# Patient Record
Sex: Female | Born: 1945
Health system: Southern US, Community
[De-identification: ages and names within clinical notes are randomized; demographics above are authoritative.]

## PROBLEM LIST (undated history)

## (undated) DIAGNOSIS — D72820 Lymphocytosis (symptomatic): Secondary | ICD-10-CM

## (undated) DIAGNOSIS — M1990 Unspecified osteoarthritis, unspecified site: Secondary | ICD-10-CM

## (undated) DIAGNOSIS — D869 Sarcoidosis, unspecified: Secondary | ICD-10-CM

## (undated) DIAGNOSIS — J189 Pneumonia, unspecified organism: Secondary | ICD-10-CM

## (undated) DIAGNOSIS — E079 Disorder of thyroid, unspecified: Secondary | ICD-10-CM

## (undated) DIAGNOSIS — D631 Anemia in chronic kidney disease: Secondary | ICD-10-CM

## (undated) DIAGNOSIS — N289 Disorder of kidney and ureter, unspecified: Secondary | ICD-10-CM

## (undated) DIAGNOSIS — M549 Dorsalgia, unspecified: Secondary | ICD-10-CM

## (undated) DIAGNOSIS — D649 Anemia, unspecified: Secondary | ICD-10-CM

## (undated) DIAGNOSIS — K219 Gastro-esophageal reflux disease without esophagitis: Secondary | ICD-10-CM

## (undated) DIAGNOSIS — I10 Essential (primary) hypertension: Secondary | ICD-10-CM

## (undated) DIAGNOSIS — N189 Chronic kidney disease, unspecified: Secondary | ICD-10-CM

## (undated) DIAGNOSIS — G8929 Other chronic pain: Secondary | ICD-10-CM

## (undated) DIAGNOSIS — D472 Monoclonal gammopathy: Secondary | ICD-10-CM

## (undated) HISTORY — PX: CATARACT EXTRACTION: SUR2

## (undated) HISTORY — DX: Lymphocytosis (symptomatic): D72.820

## (undated) HISTORY — DX: Anemia in chronic kidney disease: D63.1

## (undated) HISTORY — DX: Pneumonia, unspecified organism: J18.9

## (undated) HISTORY — DX: Anemia, unspecified: D64.9

## (undated) HISTORY — PX: CHOLECYSTECTOMY: SHX55

## (undated) HISTORY — DX: Essential (primary) hypertension: I10

## (undated) HISTORY — DX: Monoclonal gammopathy: D47.2

## (undated) HISTORY — DX: Gastro-esophageal reflux disease without esophagitis: K21.9

## (undated) HISTORY — PX: CARPAL TUNNEL RELEASE: SHX101

---

## 1997-09-05 ENCOUNTER — Ambulatory Visit (HOSPITAL_COMMUNITY): Admission: RE | Admit: 1997-09-05 | Discharge: 1997-09-05 | Payer: Self-pay | Admitting: Neurosurgery

## 1997-10-23 ENCOUNTER — Encounter: Admission: RE | Admit: 1997-10-23 | Discharge: 1997-10-23 | Payer: Self-pay | Admitting: Family Medicine

## 1997-11-22 ENCOUNTER — Encounter: Admission: RE | Admit: 1997-11-22 | Discharge: 1997-11-22 | Payer: Self-pay | Admitting: Family Medicine

## 1997-12-02 ENCOUNTER — Encounter: Admission: RE | Admit: 1997-12-02 | Discharge: 1997-12-02 | Payer: Self-pay | Admitting: Family Medicine

## 1998-01-17 ENCOUNTER — Encounter: Admission: RE | Admit: 1998-01-17 | Discharge: 1998-01-17 | Payer: Self-pay | Admitting: Family Medicine

## 1998-02-05 ENCOUNTER — Encounter: Admission: RE | Admit: 1998-02-05 | Discharge: 1998-02-05 | Payer: Self-pay | Admitting: Family Medicine

## 1998-03-24 ENCOUNTER — Ambulatory Visit (HOSPITAL_COMMUNITY): Admission: RE | Admit: 1998-03-24 | Discharge: 1998-03-24 | Payer: Self-pay | Admitting: *Deleted

## 1998-04-21 ENCOUNTER — Encounter: Admission: RE | Admit: 1998-04-21 | Discharge: 1998-04-21 | Payer: Self-pay | Admitting: Sports Medicine

## 1998-06-06 ENCOUNTER — Encounter: Admission: RE | Admit: 1998-06-06 | Discharge: 1998-06-06 | Payer: Self-pay | Admitting: Family Medicine

## 1998-07-18 ENCOUNTER — Encounter: Admission: RE | Admit: 1998-07-18 | Discharge: 1998-07-18 | Payer: Self-pay | Admitting: Family Medicine

## 1998-10-10 ENCOUNTER — Encounter: Admission: RE | Admit: 1998-10-10 | Discharge: 1998-10-10 | Payer: Self-pay | Admitting: Family Medicine

## 1999-02-13 ENCOUNTER — Encounter: Admission: RE | Admit: 1999-02-13 | Discharge: 1999-02-13 | Payer: Self-pay | Admitting: Family Medicine

## 1999-03-19 ENCOUNTER — Encounter: Admission: RE | Admit: 1999-03-19 | Discharge: 1999-03-19 | Payer: Self-pay | Admitting: Family Medicine

## 1999-06-05 ENCOUNTER — Encounter: Admission: RE | Admit: 1999-06-05 | Discharge: 1999-06-05 | Payer: Self-pay | Admitting: Family Medicine

## 1999-06-19 ENCOUNTER — Encounter: Admission: RE | Admit: 1999-06-19 | Discharge: 1999-06-19 | Payer: Self-pay | Admitting: Family Medicine

## 1999-07-10 ENCOUNTER — Encounter: Admission: RE | Admit: 1999-07-10 | Discharge: 1999-07-10 | Payer: Self-pay | Admitting: Family Medicine

## 1999-07-31 ENCOUNTER — Encounter: Admission: RE | Admit: 1999-07-31 | Discharge: 1999-07-31 | Payer: Self-pay | Admitting: Family Medicine

## 1999-08-06 ENCOUNTER — Ambulatory Visit (HOSPITAL_COMMUNITY): Admission: RE | Admit: 1999-08-06 | Discharge: 1999-08-06 | Payer: Self-pay | Admitting: Family Medicine

## 1999-08-07 ENCOUNTER — Encounter: Admission: RE | Admit: 1999-08-07 | Discharge: 1999-08-07 | Payer: Self-pay | Admitting: Family Medicine

## 1999-08-20 ENCOUNTER — Encounter: Payer: Self-pay | Admitting: Internal Medicine

## 1999-09-02 ENCOUNTER — Encounter: Admission: RE | Admit: 1999-09-02 | Discharge: 1999-09-02 | Payer: Self-pay | Admitting: Family Medicine

## 1999-09-03 ENCOUNTER — Encounter: Payer: Self-pay | Admitting: Sports Medicine

## 1999-09-03 ENCOUNTER — Encounter: Admission: RE | Admit: 1999-09-03 | Discharge: 1999-09-03 | Payer: Self-pay | Admitting: Sports Medicine

## 1999-09-17 ENCOUNTER — Ambulatory Visit (HOSPITAL_COMMUNITY): Admission: RE | Admit: 1999-09-17 | Discharge: 1999-09-17 | Payer: Self-pay | Admitting: Family Medicine

## 1999-09-24 ENCOUNTER — Encounter: Payer: Self-pay | Admitting: Neurosurgery

## 1999-09-28 ENCOUNTER — Ambulatory Visit (HOSPITAL_COMMUNITY): Admission: RE | Admit: 1999-09-28 | Discharge: 1999-09-28 | Payer: Self-pay | Admitting: Neurosurgery

## 1999-10-12 ENCOUNTER — Ambulatory Visit (HOSPITAL_COMMUNITY): Admission: RE | Admit: 1999-10-12 | Discharge: 1999-10-12 | Payer: Self-pay | Admitting: Family Medicine

## 1999-10-30 ENCOUNTER — Ambulatory Visit (HOSPITAL_COMMUNITY): Admission: RE | Admit: 1999-10-30 | Discharge: 1999-10-30 | Payer: Self-pay | Admitting: Neurosurgery

## 2000-02-24 ENCOUNTER — Encounter: Admission: RE | Admit: 2000-02-24 | Discharge: 2000-02-24 | Payer: Self-pay | Admitting: Family Medicine

## 2000-03-23 ENCOUNTER — Ambulatory Visit (HOSPITAL_COMMUNITY): Admission: RE | Admit: 2000-03-23 | Discharge: 2000-03-23 | Payer: Self-pay | Admitting: Family Medicine

## 2000-03-24 ENCOUNTER — Encounter: Admission: RE | Admit: 2000-03-24 | Discharge: 2000-03-24 | Payer: Self-pay | Admitting: Family Medicine

## 2000-03-24 ENCOUNTER — Encounter: Payer: Self-pay | Admitting: Family Medicine

## 2000-04-12 ENCOUNTER — Encounter: Payer: Self-pay | Admitting: Family Medicine

## 2000-04-12 ENCOUNTER — Encounter: Admission: RE | Admit: 2000-04-12 | Discharge: 2000-04-12 | Payer: Self-pay | Admitting: Family Medicine

## 2000-05-02 ENCOUNTER — Encounter: Admission: RE | Admit: 2000-05-02 | Discharge: 2000-05-02 | Payer: Self-pay | Admitting: Sports Medicine

## 2000-06-28 ENCOUNTER — Encounter: Admission: RE | Admit: 2000-06-28 | Discharge: 2000-06-28 | Payer: Self-pay | Admitting: Family Medicine

## 2000-08-09 ENCOUNTER — Encounter: Payer: Self-pay | Admitting: Sports Medicine

## 2000-08-09 ENCOUNTER — Encounter: Admission: RE | Admit: 2000-08-09 | Discharge: 2000-08-09 | Payer: Self-pay | Admitting: Sports Medicine

## 2000-08-17 ENCOUNTER — Encounter: Admission: RE | Admit: 2000-08-17 | Discharge: 2000-08-17 | Payer: Self-pay | Admitting: Family Medicine

## 2000-09-09 ENCOUNTER — Ambulatory Visit (HOSPITAL_COMMUNITY): Admission: RE | Admit: 2000-09-09 | Discharge: 2000-09-09 | Payer: Self-pay | Admitting: Family Medicine

## 2000-09-09 ENCOUNTER — Encounter: Admission: RE | Admit: 2000-09-09 | Discharge: 2000-09-09 | Payer: Self-pay | Admitting: Family Medicine

## 2000-09-15 ENCOUNTER — Encounter: Admission: RE | Admit: 2000-09-15 | Discharge: 2000-09-15 | Payer: Self-pay | Admitting: Sports Medicine

## 2000-09-30 ENCOUNTER — Encounter: Admission: RE | Admit: 2000-09-30 | Discharge: 2000-09-30 | Payer: Self-pay | Admitting: Family Medicine

## 2000-10-10 ENCOUNTER — Encounter: Payer: Self-pay | Admitting: Family Medicine

## 2000-10-10 ENCOUNTER — Encounter: Admission: RE | Admit: 2000-10-10 | Discharge: 2000-10-10 | Payer: Self-pay | Admitting: Family Medicine

## 2000-10-26 ENCOUNTER — Encounter: Admission: RE | Admit: 2000-10-26 | Discharge: 2000-10-26 | Payer: Self-pay | Admitting: Family Medicine

## 2000-11-30 ENCOUNTER — Encounter: Admission: RE | Admit: 2000-11-30 | Discharge: 2000-11-30 | Payer: Self-pay | Admitting: Family Medicine

## 2001-01-11 ENCOUNTER — Encounter: Admission: RE | Admit: 2001-01-11 | Discharge: 2001-01-11 | Payer: Self-pay | Admitting: Family Medicine

## 2001-03-03 ENCOUNTER — Encounter: Admission: RE | Admit: 2001-03-03 | Discharge: 2001-03-03 | Payer: Self-pay | Admitting: Family Medicine

## 2001-03-03 ENCOUNTER — Encounter: Payer: Self-pay | Admitting: Internal Medicine

## 2001-03-03 ENCOUNTER — Encounter: Payer: Self-pay | Admitting: Family Medicine

## 2001-03-24 ENCOUNTER — Encounter: Admission: RE | Admit: 2001-03-24 | Discharge: 2001-03-24 | Payer: Self-pay | Admitting: Family Medicine

## 2001-05-08 ENCOUNTER — Encounter: Payer: Self-pay | Admitting: Surgery

## 2001-05-11 ENCOUNTER — Encounter: Payer: Self-pay | Admitting: Surgery

## 2001-05-11 ENCOUNTER — Ambulatory Visit (HOSPITAL_COMMUNITY): Admission: RE | Admit: 2001-05-11 | Discharge: 2001-05-11 | Payer: Self-pay | Admitting: Surgery

## 2001-05-12 ENCOUNTER — Encounter (INDEPENDENT_AMBULATORY_CARE_PROVIDER_SITE_OTHER): Payer: Self-pay | Admitting: *Deleted

## 2001-05-12 ENCOUNTER — Inpatient Hospital Stay (HOSPITAL_COMMUNITY): Admission: RE | Admit: 2001-05-12 | Discharge: 2001-05-13 | Payer: Self-pay | Admitting: Surgery

## 2001-05-17 HISTORY — PX: THYROIDECTOMY: SHX17

## 2001-06-01 ENCOUNTER — Encounter: Payer: Self-pay | Admitting: Neurosurgery

## 2001-06-01 ENCOUNTER — Ambulatory Visit (HOSPITAL_COMMUNITY): Admission: RE | Admit: 2001-06-01 | Discharge: 2001-06-01 | Payer: Self-pay | Admitting: Neurosurgery

## 2001-07-07 ENCOUNTER — Encounter: Admission: RE | Admit: 2001-07-07 | Discharge: 2001-07-07 | Payer: Self-pay | Admitting: Family Medicine

## 2001-08-10 ENCOUNTER — Encounter: Payer: Self-pay | Admitting: Internal Medicine

## 2001-08-28 ENCOUNTER — Encounter: Payer: Self-pay | Admitting: Internal Medicine

## 2001-09-04 ENCOUNTER — Ambulatory Visit (HOSPITAL_COMMUNITY): Admission: RE | Admit: 2001-09-04 | Discharge: 2001-09-04 | Payer: Self-pay | Admitting: Neurosurgery

## 2001-09-04 ENCOUNTER — Encounter: Payer: Self-pay | Admitting: Neurosurgery

## 2002-01-09 ENCOUNTER — Encounter: Payer: Self-pay | Admitting: Internal Medicine

## 2002-01-23 ENCOUNTER — Encounter: Payer: Self-pay | Admitting: Internal Medicine

## 2002-03-19 ENCOUNTER — Encounter: Payer: Self-pay | Admitting: Internal Medicine

## 2002-07-23 ENCOUNTER — Encounter: Payer: Self-pay | Admitting: Internal Medicine

## 2002-08-02 ENCOUNTER — Encounter: Payer: Self-pay | Admitting: Internal Medicine

## 2003-09-12 ENCOUNTER — Encounter: Payer: Self-pay | Admitting: Internal Medicine

## 2004-06-16 ENCOUNTER — Encounter: Payer: Self-pay | Admitting: Internal Medicine

## 2004-06-29 ENCOUNTER — Encounter: Payer: Self-pay | Admitting: Internal Medicine

## 2004-10-16 ENCOUNTER — Encounter: Payer: Self-pay | Admitting: Internal Medicine

## 2005-02-17 ENCOUNTER — Encounter: Payer: Self-pay | Admitting: Internal Medicine

## 2005-03-12 ENCOUNTER — Encounter: Payer: Self-pay | Admitting: Internal Medicine

## 2005-11-04 ENCOUNTER — Encounter: Payer: Self-pay | Admitting: Internal Medicine

## 2006-01-13 ENCOUNTER — Encounter: Payer: Self-pay | Admitting: Internal Medicine

## 2006-08-16 ENCOUNTER — Encounter: Payer: Self-pay | Admitting: Internal Medicine

## 2007-01-05 ENCOUNTER — Encounter: Payer: Self-pay | Admitting: Internal Medicine

## 2007-01-17 ENCOUNTER — Encounter: Payer: Self-pay | Admitting: Internal Medicine

## 2007-10-30 ENCOUNTER — Encounter: Payer: Self-pay | Admitting: Internal Medicine

## 2008-05-17 HISTORY — PX: COLONOSCOPY: SHX174

## 2008-11-27 ENCOUNTER — Encounter: Payer: Self-pay | Admitting: Internal Medicine

## 2009-05-21 ENCOUNTER — Encounter: Payer: Self-pay | Admitting: Internal Medicine

## 2009-10-16 ENCOUNTER — Ambulatory Visit: Payer: Self-pay | Admitting: Internal Medicine

## 2009-10-16 DIAGNOSIS — G5600 Carpal tunnel syndrome, unspecified upper limb: Secondary | ICD-10-CM | POA: Insufficient documentation

## 2009-10-16 DIAGNOSIS — N182 Chronic kidney disease, stage 2 (mild): Secondary | ICD-10-CM

## 2009-10-16 DIAGNOSIS — D869 Sarcoidosis, unspecified: Secondary | ICD-10-CM | POA: Insufficient documentation

## 2009-10-16 DIAGNOSIS — IMO0002 Reserved for concepts with insufficient information to code with codable children: Secondary | ICD-10-CM | POA: Insufficient documentation

## 2009-10-16 DIAGNOSIS — M5090 Cervical disc disorder, unspecified, unspecified cervical region: Secondary | ICD-10-CM | POA: Insufficient documentation

## 2009-10-16 DIAGNOSIS — M129 Arthropathy, unspecified: Secondary | ICD-10-CM | POA: Insufficient documentation

## 2009-10-16 DIAGNOSIS — H209 Unspecified iridocyclitis: Secondary | ICD-10-CM | POA: Insufficient documentation

## 2009-10-16 DIAGNOSIS — D631 Anemia in chronic kidney disease: Secondary | ICD-10-CM | POA: Insufficient documentation

## 2009-10-16 DIAGNOSIS — I10 Essential (primary) hypertension: Secondary | ICD-10-CM | POA: Insufficient documentation

## 2009-10-16 DIAGNOSIS — M48061 Spinal stenosis, lumbar region without neurogenic claudication: Secondary | ICD-10-CM | POA: Insufficient documentation

## 2009-10-16 DIAGNOSIS — E039 Hypothyroidism, unspecified: Secondary | ICD-10-CM | POA: Insufficient documentation

## 2009-10-22 ENCOUNTER — Ambulatory Visit: Payer: Self-pay | Admitting: Internal Medicine

## 2009-10-24 ENCOUNTER — Encounter: Payer: Self-pay | Admitting: Internal Medicine

## 2009-10-30 LAB — CONVERTED CEMR LAB
ALT: 18 units/L (ref 0–35)
AST: 25 units/L (ref 0–37)
Albumin: 4.5 g/dL (ref 3.5–5.2)
Alkaline Phosphatase: 46 units/L (ref 39–117)
BUN: 28 mg/dL — ABNORMAL HIGH (ref 6–23)
Basophils Absolute: 0 10*3/uL (ref 0.0–0.1)
Basophils Relative: 0.9 % (ref 0.0–3.0)
Bilirubin, Direct: 0 mg/dL (ref 0.0–0.3)
CO2: 29 meq/L (ref 19–32)
Calcium: 9.4 mg/dL (ref 8.4–10.5)
Chloride: 107 meq/L (ref 96–112)
Cholesterol: 278 mg/dL — ABNORMAL HIGH (ref 0–200)
Creatinine, Ser: 1.6 mg/dL — ABNORMAL HIGH (ref 0.4–1.2)
Direct LDL: 148.2 mg/dL
Eosinophils Absolute: 0.1 10*3/uL (ref 0.0–0.7)
Eosinophils Relative: 3.2 % (ref 0.0–5.0)
Folate: >20 ng/mL
GFR calc non Af Amer: 41.2 mL/min (ref >60)
Glucose, Bld: 92 mg/dL (ref 70–99)
HCT: 32.6 % — ABNORMAL LOW (ref 36.0–46.0)
HDL: 100.9 mg/dL (ref >39.00)
Hemoglobin: 11.2 g/dL — ABNORMAL LOW (ref 12.0–15.0)
Iron: 91 ug/dL (ref 42–145)
Lymphocytes Relative: 54 % — ABNORMAL HIGH (ref 12.0–46.0)
Lymphs Abs: 2.3 10*3/uL (ref 0.7–4.0)
MCHC: 34.5 g/dL (ref 30.0–36.0)
MCV: 98.4 fL (ref 78.0–100.0)
Monocytes Absolute: 0.4 10*3/uL (ref 0.1–1.0)
Monocytes Relative: 9.5 % (ref 3.0–12.0)
Neutro Abs: 1.4 10*3/uL (ref 1.4–7.7)
Neutrophils Relative %: 32.4 % — ABNORMAL LOW (ref 43.0–77.0)
Platelets: 257 10*3/uL (ref 150.0–400.0)
Potassium: 3.5 meq/L (ref 3.5–5.1)
RBC: 3.31 M/uL — ABNORMAL LOW (ref 3.87–5.11)
RDW: 13.6 % (ref 11.5–14.6)
Saturation Ratios: 24.7 % (ref 20.0–50.0)
Sodium: 145 meq/L (ref 135–145)
TSH: 20.9 microintl units/mL — ABNORMAL HIGH (ref 0.35–5.50)
Total Bilirubin: 0.5 mg/dL (ref 0.3–1.2)
Total CHOL/HDL Ratio: 3
Total Protein: 7.4 g/dL (ref 6.0–8.3)
Transferrin: 262.7 mg/dL (ref 212.0–360.0)
Triglycerides: 110 mg/dL (ref 0.0–149.0)
VLDL: 22 mg/dL (ref 0.0–40.0)
Vitamin B-12: 935 pg/mL — ABNORMAL HIGH (ref 211–911)
WBC: 4.2 10*3/uL — ABNORMAL LOW (ref 4.5–10.5)

## 2009-11-18 ENCOUNTER — Telehealth (INDEPENDENT_AMBULATORY_CARE_PROVIDER_SITE_OTHER): Payer: Self-pay | Admitting: *Deleted

## 2009-12-15 ENCOUNTER — Telehealth (INDEPENDENT_AMBULATORY_CARE_PROVIDER_SITE_OTHER): Payer: Self-pay | Admitting: *Deleted

## 2009-12-17 ENCOUNTER — Ambulatory Visit: Payer: Self-pay | Admitting: Internal Medicine

## 2009-12-17 LAB — CONVERTED CEMR LAB
BUN: 22 mg/dL (ref 6–23)
Creatinine, Ser: 1.2 mg/dL (ref 0.4–1.2)
Potassium: 3.8 meq/L (ref 3.5–5.1)
TSH: 23.86 microintl units/mL — ABNORMAL HIGH (ref 0.35–5.50)

## 2009-12-19 ENCOUNTER — Ambulatory Visit: Payer: Self-pay | Admitting: Internal Medicine

## 2009-12-19 DIAGNOSIS — M5126 Other intervertebral disc displacement, lumbar region: Secondary | ICD-10-CM | POA: Insufficient documentation

## 2009-12-19 DIAGNOSIS — N183 Chronic kidney disease, stage 3 (moderate): Secondary | ICD-10-CM

## 2009-12-19 DIAGNOSIS — N184 Chronic kidney disease, stage 4 (severe): Secondary | ICD-10-CM | POA: Insufficient documentation

## 2009-12-22 ENCOUNTER — Telehealth (INDEPENDENT_AMBULATORY_CARE_PROVIDER_SITE_OTHER): Payer: Self-pay | Admitting: *Deleted

## 2010-01-14 ENCOUNTER — Telehealth (INDEPENDENT_AMBULATORY_CARE_PROVIDER_SITE_OTHER): Payer: Self-pay | Admitting: *Deleted

## 2010-01-20 ENCOUNTER — Ambulatory Visit: Payer: Self-pay | Admitting: Internal Medicine

## 2010-01-20 DIAGNOSIS — R635 Abnormal weight gain: Secondary | ICD-10-CM | POA: Insufficient documentation

## 2010-01-20 DIAGNOSIS — M25569 Pain in unspecified knee: Secondary | ICD-10-CM | POA: Insufficient documentation

## 2010-01-22 ENCOUNTER — Telehealth (INDEPENDENT_AMBULATORY_CARE_PROVIDER_SITE_OTHER): Payer: Self-pay | Admitting: *Deleted

## 2010-02-04 ENCOUNTER — Encounter: Payer: Self-pay | Admitting: Internal Medicine

## 2010-02-18 ENCOUNTER — Ambulatory Visit: Payer: Self-pay | Admitting: Internal Medicine

## 2010-02-19 LAB — CONVERTED CEMR LAB: TSH: 0.19 microintl units/mL — ABNORMAL LOW (ref 0.35–5.50)

## 2010-02-24 ENCOUNTER — Telehealth (INDEPENDENT_AMBULATORY_CARE_PROVIDER_SITE_OTHER): Payer: Self-pay | Admitting: *Deleted

## 2010-02-26 ENCOUNTER — Ambulatory Visit: Payer: Self-pay | Admitting: Internal Medicine

## 2010-03-02 ENCOUNTER — Telehealth: Payer: Self-pay | Admitting: Internal Medicine

## 2010-03-11 ENCOUNTER — Telehealth (INDEPENDENT_AMBULATORY_CARE_PROVIDER_SITE_OTHER): Payer: Self-pay | Admitting: *Deleted

## 2010-03-11 ENCOUNTER — Encounter: Payer: Self-pay | Admitting: Internal Medicine

## 2010-03-30 ENCOUNTER — Encounter: Payer: Self-pay | Admitting: Internal Medicine

## 2010-04-03 ENCOUNTER — Telehealth (INDEPENDENT_AMBULATORY_CARE_PROVIDER_SITE_OTHER): Payer: Self-pay | Admitting: *Deleted

## 2010-04-16 DIAGNOSIS — J189 Pneumonia, unspecified organism: Secondary | ICD-10-CM

## 2010-04-16 HISTORY — DX: Pneumonia, unspecified organism: J18.9

## 2010-04-17 ENCOUNTER — Telehealth: Payer: Self-pay | Admitting: Internal Medicine

## 2010-04-29 ENCOUNTER — Ambulatory Visit: Payer: Self-pay | Admitting: Internal Medicine

## 2010-05-03 ENCOUNTER — Emergency Department (HOSPITAL_COMMUNITY)
Admission: EM | Admit: 2010-05-03 | Discharge: 2010-05-03 | Payer: Self-pay | Source: Home / Self Care | Admitting: Emergency Medicine

## 2010-05-04 ENCOUNTER — Telehealth: Payer: Self-pay | Admitting: Internal Medicine

## 2010-05-04 ENCOUNTER — Ambulatory Visit: Payer: Self-pay | Admitting: Internal Medicine

## 2010-05-04 DIAGNOSIS — J189 Pneumonia, unspecified organism: Secondary | ICD-10-CM | POA: Insufficient documentation

## 2010-05-05 LAB — CONVERTED CEMR LAB
Basophils Absolute: 0 10*3/uL (ref 0.0–0.1)
Basophils Relative: 0.2 % (ref 0.0–3.0)
Eosinophils Absolute: 0.2 10*3/uL (ref 0.0–0.7)
Eosinophils Relative: 2.7 % (ref 0.0–5.0)
HCT: 30.7 % — ABNORMAL LOW (ref 36.0–46.0)
Hemoglobin: 10.4 g/dL — ABNORMAL LOW (ref 12.0–15.0)
Lymphocytes Relative: 27.9 % (ref 12.0–46.0)
Lymphs Abs: 1.6 10*3/uL (ref 0.7–4.0)
MCHC: 33.8 g/dL (ref 30.0–36.0)
MCV: 97.2 fL (ref 78.0–100.0)
Monocytes Absolute: 1 10*3/uL (ref 0.1–1.0)
Monocytes Relative: 17.2 % — ABNORMAL HIGH (ref 3.0–12.0)
Neutro Abs: 2.9 10*3/uL (ref 1.4–7.7)
Neutrophils Relative %: 52 % (ref 43.0–77.0)
Platelets: 315 10*3/uL (ref 150.0–400.0)
RBC: 3.16 M/uL — ABNORMAL LOW (ref 3.87–5.11)
RDW: 14.5 % (ref 11.5–14.6)
WBC: 5.7 10*3/uL (ref 4.5–10.5)

## 2010-05-26 ENCOUNTER — Ambulatory Visit
Admission: RE | Admit: 2010-05-26 | Discharge: 2010-05-26 | Payer: Self-pay | Source: Home / Self Care | Attending: Internal Medicine | Admitting: Internal Medicine

## 2010-05-26 DIAGNOSIS — R0609 Other forms of dyspnea: Secondary | ICD-10-CM | POA: Insufficient documentation

## 2010-05-26 DIAGNOSIS — R0989 Other specified symptoms and signs involving the circulatory and respiratory systems: Secondary | ICD-10-CM | POA: Insufficient documentation

## 2010-06-01 ENCOUNTER — Ambulatory Visit
Admission: RE | Admit: 2010-06-01 | Discharge: 2010-06-01 | Payer: Self-pay | Source: Home / Self Care | Attending: Internal Medicine | Admitting: Internal Medicine

## 2010-06-03 ENCOUNTER — Encounter: Payer: Self-pay | Admitting: Internal Medicine

## 2010-06-08 ENCOUNTER — Encounter
Admission: RE | Admit: 2010-06-08 | Discharge: 2010-06-08 | Payer: Self-pay | Source: Home / Self Care | Attending: Neurosurgery | Admitting: Neurosurgery

## 2010-06-10 ENCOUNTER — Telehealth (INDEPENDENT_AMBULATORY_CARE_PROVIDER_SITE_OTHER): Payer: Self-pay | Admitting: *Deleted

## 2010-06-16 NOTE — Assessment & Plan Note (Signed)
Summary: new to est//lch   Vital Signs:  Patient profile:   65 year old female Height:      64 inches Weight:      191.2 pounds BMI:     32.94 Temp:     98.4 degrees F oral Pulse rate:   64 / minute Resp:     14 per minute BP sitting:   146 / 80  (left arm) Cuff size:   large  Vitals Entered By: Georgette Dover (October 16, 2009 3:06 PM) CC: New Patient Establish: CPX  Comments REVIEWED MED LIST, PATIENT AGREED DOSE AND INSTRUCTION CORRECT    CC:  New Patient Establish: CPX .  History of Present Illness: Tina Molina is here to establish as a primary care patient ; she has relocated from [place]. She has active symptoms related to LS Spinal Stenosis for which she sees Dr Annette Stable, [place].  Preventive Screening-Counseling & Management  Alcohol-Tobacco     Smoking Status: quit  Caffeine-Diet-Exercise     Does Patient Exercise: no  Allergies (verified): 1)  ! Sulfa 2)  ! Penicillin 3)  ! Codeine  Past History:  Past Medical History: Hypertension Arthritis, DJD; Spinal Stenosis LS, S/P ESI ? X 7 , Dr Darryl Lent ,Orthopedist, [place]; Uveitis Sarcoidosis, PMH of  in 1990s Hypothyroidism Anemia, Dr Juleen China , [facility] , [place] , [place], [facility] therapy; Alopoecia from Folliculitis, Dr Dwaine Deter, [place]  Past Surgical History: Cholecystectomy-2006 CARPAL TUNNEL SYNDROME, BILATERAL (ICD-354.0), S/P surgery CERVICAL DISC DISORDER (ICD-722.91), S/P Discectomy X3 Thyroidectomy-2003 for goiter G 0 P 0; Colonoscopy negative 2010  Family History: Father: anemia,arthritis, CHF Mother: DM,arthritis, blind from Glaucoma Siblings: bro:HTN, anemia, renal disease; sister :HTN, renal disease , anemia ; M aunts : DM; PGM : arthritis  Social History: Separated Retired Former Smoker:quit 1999 Alcohol use-yes:occasionally Regular exercise-no Smoking Status:  quit Does Patient Exercise:  no  Review of Systems General:  Complains of fatigue; denies chills, fever, and sweats; Weight loss of 8# on  purpose. Eyes:  Denies blurring, double vision, and vision loss-both eyes. ENT:  Denies difficulty swallowing and hoarseness. CV:  Complains of palpitations, shortness of breath with exertion, swelling of feet, and swelling of hands; denies chest pain or discomfort, difficulty breathing at night, difficulty breathing while lying down, and leg cramps with exertion; Occasional fluttering. Resp:  Denies cough, excessive snoring, hypersomnolence, morning headaches, and sputum productive. GI:  Denies abdominal pain, bloody stools, dark tarry stools, and indigestion. GU:  Denies discharge, dysuria, and hematuria. MS:  Complains of joint pain, low back pain, and mid back pain; denies joint redness and joint swelling. Derm:  Denies changes in nail beds, dryness, hair loss, and lesion(s). Neuro:  Complains of numbness, tingling, and weakness; denies brief paralysis; N&T LLE > RLE; weakness intermittently after standing. Intermittent pain R buttock & R inguinal crease. Psych:  Complains of anxiety; denies depression, easily angered, easily tearful, and irritability. Endo:  Denies cold intolerance, excessive hunger, excessive thirst, excessive urination, and heat intolerance. Heme:  Complains of abnormal bruising; denies bleeding. Allergy:  Denies itching eyes and sneezing.  Physical Exam  General:  well-nourished,in no acute distress; alert,appropriate and cooperative throughout examination Head:  Normocephalic and atraumatic without obvious abnormalities. Patchy  alopecia  Eyes:  No corneal or conjunctival inflammation noted. Marland Kitchen Perrla. Funduscopic exam benign, without hemorrhages, exudates or papilledema.  Ears:  External ear exam shows no significant lesions or deformities.  Otoscopic examination reveals clear canals, tympanic membranes are intact bilaterally without bulging, retraction,  inflammation or discharge. Hearing is grossly normal bilaterally. Nose:  External nasal examination shows no  deformity or inflammation. Nasal mucosa are pink and moist without lesions or exudates. Mouth:  Oral mucosa and oropharynx without lesions or exudates.  Teeth in good repair. Neck:  No deformities, masses, or tenderness noted. Scar tissue subcutaneously anterior neck Lungs:  Normal respiratory effort, chest expands symmetrically. Lungs are clear to auscultation, no crackles or wheezes. Heart:  Normal rate and regular rhythm. S1 and S2 normal without gallop, murmur, click, rub. S4 Abdomen:  Bowel sounds positive,abdomen soft and non-tender without masses, organomegaly or hernias noted. Genitalia:  as per Gyn Msk:  No deformity or scoliosis noted of thoracic or lumbar spine.   Pulses:  R and L carotid,radial,dorsalis pedis and posterior tibial pulses are full and equal bilaterally Extremities:  No clubbing, cyanosis, edema, or deformity noted with normal full range of motion of all joints.   Fusiform knees; pain LS area with SLR & hip ROM Neurologic:  alert & oriented X3, strength normal in all extremities, and DTRs symmetrical and normal except decreased L knee DTR.   Skin:  Intact without suspicious lesions or rashes Cervical Nodes:  No lymphadenopathy noted Axillary Nodes:  No palpable lymphadenopathy Psych:  memory intact for recent and remote, normally interactive, and good eye contact.     Impression & Recommendations:  Problem # 1:  ROUTINE GENERAL MEDICAL EXAM@HEALTH  CARE FACL (ICD-V70.0)  Orders: EKG w/ Interpretation (93000)  Problem # 2:  ANEMIA-NOS (ICD-285.9) PMH of  Problem # 3:  HYPOTHYROIDISM (ICD-244.9)  Her updated medication list for this problem includes:    Levothyroxine Sodium 112 Mcg Tabs (Levothyroxine sodium) .Marland Kitchen... 1 by mouth once daily  Problem # 4:  SPINAL STENOSIS, LUMBAR (ICD-724.02) as per Dr Annette Stable  Problem # 5:  HYPERTENSION (ICD-401.9)  Her updated medication list for this problem includes:    Chlorthalidone 25 Mg Tabs (Chlorthalidone) .Marland Kitchen... 1 by  mouth once daily    Toprol Xl 100 Mg Xr24h-tab (Metoprolol succinate) .Marland Kitchen... 1 by mouth two times a day    Diovan 320 Mg Tabs (Valsartan) .Marland Kitchen... 1 by mouth once daily  Orders: EKG w/ Interpretation (93000)  Problem # 6:  ARTHRITIS (ICD-716.90) DJD, DDD  Complete Medication List: 1)  Chlorthalidone 25 Mg Tabs (Chlorthalidone) .Marland Kitchen.. 1 by mouth once daily 2)  Toprol Xl 100 Mg Xr24h-tab (Metoprolol succinate) .Marland Kitchen.. 1 by mouth two times a day 3)  Diovan 320 Mg Tabs (Valsartan) .Marland Kitchen.. 1 by mouth once daily 4)  Levothyroxine Sodium 112 Mcg Tabs (Levothyroxine sodium) .Marland Kitchen.. 1 by mouth once daily 5)  Bayer Low Strength 81 Mg Tbec (Aspirin) .Marland Kitchen.. 1 by mouth once daily 6)  Vicodin 5-500 Mg Tabs (Hydrocodone-acetaminophen) .... As needed for pain 7)  Ultram 50 Mg Tabs (Tramadol hcl) .Marland Kitchen.. 1 by mouth three times a day 8)  Diazepam 5 Mg Tabs (Diazepam) .Marland Kitchen.. 1 two times a day as needed  Patient Instructions: 1)  Please schedule fasting labs; see Diagnoses for Codes: 2)  BMP ; 3)  Hepatic Panel ; 4)  Lipid Panel ; 5)  TSH; 6)  CBC w/ Diff ; B12/folate; iron panel 7)  Check your Blood Pressure regularly. If it is above: 135/85 on average  you should make an appointment. Prescriptions: DIAZEPAM 5 MG TABS (DIAZEPAM) 1 two times a day as needed  #60 x 2   Entered and Authorized by:   Unice Cobble MD   Signed by:   Unice Cobble MD  on 10/16/2009   Method used:   Print then Give to Patient   RxID:   [id number] DIOVAN 320 MG TABS (VALSARTAN) 1 by mouth once daily  #90 x 1   Entered and Authorized by:   Unice Cobble MD   Signed by:   Unice Cobble MD on 10/16/2009   Method used:   Print then Give to Patient   RxID:   [id number]    Immunization History:  Tetanus/Td Immunization History:    Tetanus/Td:  tdap (06/01/2009)

## 2010-06-16 NOTE — Assessment & Plan Note (Signed)
Summary: rto 2 month/cbs   Vital Signs:  Patient profile:   65 year old female Weight:      198 pounds BMI:     34.11 Temp:     98.4 degrees F oral Pulse rate:   80 / minute Resp:     17 per minute BP sitting:   130 / 78  (left arm) Cuff size:   large  Vitals Entered By: Georgette Dover CMA (February 26, 2010 3:05 PM) CC: 2 Month follow-up, ongoing pain   CC:  2 Month follow-up and ongoing pain.  History of Present Illness: R knee pain unchanged despite injection 02/04/2010 by Dr Onnie Graham ; that evaluation was reviewed: "strain with arthrosis exacerbation". Her LS area pain has become throbbing in past 2 weeks; the pain radiates into R inguinal area. She last saw Dr Hillery Jacks in 12/2009. He recommnded disc surgery . She has Spinal Stenosis ; S/P ESI ?6-8X over  past 7 years, all in [place] by Dr Werner Lean.TSH was 0.19; goal discussed( 1-3)  Current Medications (verified): 1)  Chlorthalidone 25 Mg Tabs (Chlorthalidone) .Marland Kitchen.. 1 By Mouth Once Daily 2)  Toprol Xl 100 Mg Xr24h-Tab (Metoprolol Succinate) .Marland Kitchen.. 1 By Mouth Two Times A Day 3)  Diovan 320 Mg Tabs (Valsartan) .Marland Kitchen.. 1 By Mouth Once Daily 4)  Levothyroxine Sodium 150  Mcg Tabs (Levothyroxine Sodium) .Marland Kitchen.. 1 Once Daily 5)  Bayer Low Strength 81 Mg Tbec (Aspirin) .Marland Kitchen.. 1 By Mouth Once Daily 6)  Vicodin 5-500 Mg Tabs (Hydrocodone-Acetaminophen) .Marland Kitchen.. 1 Every 6 Hrs As Needed For Back Pain 7)  Diazepam 5 Mg Tabs (Diazepam) .Marland Kitchen.. 1 Two Times A Day As Needed 8)  Zolpidem Tartrate 10 Mg Tabs (Zolpidem Tartrate) .Marland Kitchen.. 1 By Mouth Q Third Night As Needed Only 9)  Timolol Maleate 0.5 % Soln (Timolol Maleate) .Marland Kitchen.. 1 Drop in Both Eyes Every Morning 10)  Voltaren 1 % Gel (Diclofenac Sodium) .... Apply 4 Grams To Knees Qid 11)  Potassium Chloride Cr 10 Meq Cr-Tabs (Potassium Chloride) .Marland Kitchen.. 1 By Mouth Once Daily 12)  Fluocinonide 0.05 % Oint (Fluocinonide) .... Apply To Scalp 13)  Rogaine 2 % Soln (Minoxidil) .... Use On Scalp Every Other Day 14)  Hydrocortisone  2.5 % Crea (Hydrocortisone) .... Apply To Mouth 2-3 X Weekly 15)  Gabapentin 100 Mg Caps (Gabapentin) .Marland Kitchen.. 1 Every 8 Hrs As Needed For Nerve Pain 16)  Vitamin B-12 500 Mcg Subl (Cyanocobalamin) .Marland Kitchen.. 1 By Mouth Once Daily 17)  Nopalea 3oz .... Two Times A Day 18)  Cymbalta 60 Mg Cpep (Duloxetine Hcl) .Marland Kitchen.. 1 Once Daily  Allergies: 1)  ! Sulfa 2)  ! Penicillin 3)  ! Codeine  Review of Systems GU:  Denies incontinence. Neuro:  Denies brief paralysis and weakness.  Physical Exam  General:  in no acute distress; alert,appropriate and cooperative throughout examination Msk:  Lay down & sat up w/o help Extremities:  No clubbing, cyanosis, edema. Crepitus L knee > R. ? minimal effusion L knee. Neurologic:  alert & oriented X3, strength normal in all extremities, gait abnormal  with limping on R knee, and DTRs asymmetrical  0+ @ L knee   Impression & Recommendations:  Problem # 1:  KNEE PAIN, RIGHT (ICD-719.46)  Her updated medication list for this problem includes:    Bayer Low Strength 81 Mg Tbec (Aspirin) .Marland Kitchen... 1 by mouth once daily    Vicodin 5-500 Mg Tabs (Hydrocodone-acetaminophen) .Marland Kitchen... 1 every 6 hrs as needed for back pain  Orders: Orthopedic Referral (  Ortho)  Problem # 2:  HERNIATED LUMBAR DISC (ICD-722.10) PMH of  Problem # 3:  SPINAL STENOSIS, LUMBAR (ICD-724.02) L 1or L 2 distribution of pain  Problem # 4:  HYPOTHYROIDISM (ICD-244.9)  Complete Medication List: 1)  Chlorthalidone 25 Mg Tabs (Chlorthalidone) .Marland Kitchen.. 1 by mouth once daily 2)  Toprol Xl 100 Mg Xr24h-tab (Metoprolol succinate) .Marland Kitchen.. 1 by mouth two times a day 3)  Diovan 320 Mg Tabs (Valsartan) .Marland Kitchen.. 1 by mouth once daily 4)  Levothyroxine Sodium 150 Mcg Tabs (levothyroxine Sodium)  .Marland Kitchen.. 1 once daily except 1/2 every weds 5)  Bayer Low Strength 81 Mg Tbec (Aspirin) .Marland Kitchen.. 1 by mouth once daily 6)  Vicodin 5-500 Mg Tabs (Hydrocodone-acetaminophen) .Marland Kitchen.. 1 every 6 hrs as needed for back pain 7)  Diazepam 5 Mg Tabs  (Diazepam) .Marland Kitchen.. 1 two times a day as needed 8)  Zolpidem Tartrate 10 Mg Tabs (Zolpidem tartrate) .Marland Kitchen.. 1 by mouth q third night as needed only 9)  Timolol Maleate 0.5 % Soln (Timolol maleate) .Marland Kitchen.. 1 drop in both eyes every morning 10)  Voltaren 1 % Gel (Diclofenac sodium) .... Apply 4 grams to knees qid 11)  Potassium Chloride Cr 10 Meq Cr-tabs (Potassium chloride) .Marland Kitchen.. 1 by mouth once daily 12)  Fluocinonide 0.05 % Oint (Fluocinonide) .... Apply to scalp 13)  Rogaine 2 % Soln (Minoxidil) .... Use on scalp every other day 14)  Hydrocortisone 2.5 % Crea (Hydrocortisone) .... Apply to mouth 2-3 x weekly 15)  Gabapentin 100 Mg Caps (Gabapentin) .Marland Kitchen.. 1 every 8 hrs as needed for nerve pain 16)  Vitamin B-12 500 Mcg Subl (Cyanocobalamin) .Marland Kitchen.. 1 by mouth once daily 17)  Nopalea 3oz  .... Two times a day 18)  Cymbalta 60 Mg Cpep (Duloxetine hcl) .Marland Kitchen.. 1 once daily  Patient Instructions: 1)  Every 48 hrs increase the Gabapentin by 1 pill every 8 hrs , upto a max of 300 mg(3 pills) every 8 hrs as needed . Either see Dr Annette Stable or Dr Nelva Bush to address the chronic back issues. 2)  Please schedule a follow-up appointment in 3 months. 3)  TSH prior to visit, ICD-9:244.9.

## 2010-06-16 NOTE — Progress Notes (Signed)
Summary: Refill Request  Phone Note Refill Request Call back at [phone] Message from:  Pharmacy on April 03, 2010 8:14 AM  Refills Requested: Medication #1:  DIAZEPAM 5 MG TABS 1 two times a day as needed   Dosage confirmed as above?Dosage Confirmed   Supply Requested: 1 month   Last Refilled: [facility]/24/2010 [facility] [address]   Next Appointment Scheduled: none Initial call taken by: Elna Breslow,  April 03, 2010 8:15 AM    Prescriptions: DIAZEPAM 5 MG TABS (DIAZEPAM) 1 two times a day as needed  #60 x 1   Entered by:   Georgette Dover CMA   Authorized by:   Unice Cobble MD   Signed by:   Georgette Dover CMA on 04/03/2010   Method used:   Printed then faxed to ...       [facility] [phone]* (retail)       [place], [place]  [id number]       Ph: [phone] or [phone]       Fax: [phone]   RxID:   [id number]

## 2010-06-16 NOTE — Consult Note (Signed)
Summary: [facility]   Imported By: Edmonia James 02/16/2010 09:12:49  _____________________________________________________________________  External Attachment:    Type:   Image     Comment:   External Document

## 2010-06-16 NOTE — Letter (Signed)
Summary: [facility]   Imported By: Edmonia James 04/13/2010 14:20:14  _____________________________________________________________________  External Attachment:    Type:   Image     Comment:   External Document

## 2010-06-16 NOTE — Progress Notes (Signed)
Summary: refill  Phone Note Refill Request Message from:  Fax from Pharmacy on December 22, 2009 2:39 PM  Refills Requested: Medication #1:  ZOLPIDEM TARTRATE 10 MG TABS 1 by mouth q third night as needed only [facility] [address] - fax [phone]  Initial call taken by: Arbie [place],  December 22, 2009 2:40 PM    Prescriptions: ZOLPIDEM TARTRATE 10 MG TABS (ZOLPIDEM TARTRATE) 1 by mouth q third night as needed only  #10 x 0   Entered by:   Georgette Dover CMA   Authorized by:   Unice Cobble MD   Signed by:   Georgette Dover CMA on 12/22/2009   Method used:   Printed then faxed to ...       [facility] [phone]* (retail)       [place], [place]  [id number]       Ph: [phone] or [phone]       Fax: [phone]   RxID:   [id number]

## 2010-06-16 NOTE — Letter (Signed)
Summary: [facility]  [facility]   Imported By: Phillis Knack 01/23/2010 08:32:59  _____________________________________________________________________  External Attachment:    Type:   Image     Comment:   External Document

## 2010-06-16 NOTE — Letter (Signed)
Summary: [facility]   Imported By: Edmonia James 11/06/2009 10:06:02  _____________________________________________________________________  External Attachment:    Type:   Image     Comment:   External Document

## 2010-06-16 NOTE — Assessment & Plan Note (Signed)
Summary: Ongoing back pain/scm   Vital Signs:  Patient profile:   65 year old female Weight:      199.8 pounds Temp:     98.3 degrees F oral Pulse rate:   84 / minute Resp:     16 per minute BP sitting:   146 / 80  (left arm) Cuff size:   large  Vitals Entered By: Georgette Dover CMA (January 20, 2010 3:32 PM) CC: Ongoing back pain, Lower Extremity Joint pain, Back pain   CC:  Ongoing back pain, Lower Extremity Joint pain, and Back pain.  History of Present Illness: Lower Extremity Joint Pain      This is a 65 year old woman who presents with Lower Extremity Joint pain.  The patient reports swelling &  redness which have improved with Naproxen from UC.They provided Patient Information on Meniscal Tear as per Xrays.She has still has  decreased ROM, and weakness, but denies giving away, locking, and popping.  The pain is located in the right knee.  The pain began with twisting her leg  on the tile.  The pain is described as sharp, intermittent, and activity related.  The patient denies the following symptoms: fever, rash, photosensitivity, eye symptoms, diarrhea, and dysuria.   Back Pain      The patient also presents with Back pain.  The patient denies chills, fecal incontinence, urinary incontinence, urinary retention, and inability to care for self.  The pain is located in the right low back and left low back.  The pain began gradually and after straining R knee  .  The pain radiates to the right leg above  the knee.  The pain is made worse by standing or walking and extension.  The pain is made better by sitting or bending forward.  PMH of Spinal Stenosis.   NSAIDS  contraindicated as per her Orthopedist in [place] due to either renal or liver issues.                                                                                         Concerns re: weight gain: 7 -8 # weight gain. Previously Phentermine .Physiology of High Fructose Corn Syrup induced obesity discussed. Risks of Diet Pills  discussed .  Allergies: 1)  ! Sulfa 2)  ! Penicillin 3)  ! Codeine  Physical Exam  General:  well-nourished,in no acute distress; alert,appropriate and cooperative throughout examination Abdomen:  Bowel sounds positive,abdomen soft and non-tender without masses, organomegaly or hernias noted. No AAA  Msk:  Classic low back crawl up & down exam table Psych:  memory intact for recent and remote, normally interactive, good eye contact, and not anxious appearing.     Detailed Back/Spine Exam  Skin:    Intact with no erythema; no scarring.    Palpation:    Tender to percussion LS spine  Vascular:    dorsalis pedis and posterior tibial pulses 2+ and symmetric; no evidence of ischemia.   Lumbosacral Exam:  Lying Straight Leg Raise:    Right:  negative    Left:  negative Toe Walking:    Right:  normal  Left:  normal Heel Walking:    Right:  normal    Left:  normal   Knee Exam  Skin:    Intact, no scars, lesions, rashes, cafe au lait spots, or bruising.    Inspection:     No deformity, ecchymosis or swelling.   Palpation:    Non-tender to palpation over medial joint line, lateral joint line, parapatellar, condylar, patellar tendon, or Pes bursa.   Motor:    Motor strength  decreased R  quadriceps  Reflexes:    decreased L-patellar; normal R.    Knee Exam:    Crepitus of knees   Problems:  Medical Problems Added: 1)  Dx of Weight Gain  (ICD-783.1) 2)  Dx of Knee Pain, Right  [id number])  Impression & Recommendations:  Problem # 1:  KNEE PAIN, RIGHT (ICD-719.46)  Her updated medication list for this problem includes:    Bayer Low Strength 81 Mg Tbec (Aspirin) .Marland Kitchen... 1 by mouth once daily    Vicodin 5-500 Mg Tabs (Hydrocodone-acetaminophen) .Marland Kitchen... 1 every 6 hrs as needed for back pain    Ultram 50 Mg Tabs (Tramadol hcl) .Marland Kitchen... 1 by mouth three times a day  Orders: Orthopedic Referral (Ortho)  Problem # 2:  SPINAL STENOSIS, LUMBAR (ICD-724.02) acute  exacerabtion  Problem # 3:  WEIGHT GAIN (ICD-783.1)  Complete Medication List: 1)  Chlorthalidone 25 Mg Tabs (Chlorthalidone) .Marland Kitchen.. 1 by mouth once daily 2)  Toprol Xl 100 Mg Xr24h-tab (Metoprolol succinate) .Marland Kitchen.. 1 by mouth two times a day 3)  Diovan 320 Mg Tabs (Valsartan) .Marland Kitchen.. 1 by mouth once daily 4)  Levothyroxine Sodium 150 Mcg Tabs (levothyroxine Sodium)  .Marland Kitchen.. 1 once daily 5)  Bayer Low Strength 81 Mg Tbec (Aspirin) .Marland Kitchen.. 1 by mouth once daily 6)  Vicodin 5-500 Mg Tabs (Hydrocodone-acetaminophen) .Marland Kitchen.. 1 every 6 hrs as needed for back pain 7)  Ultram 50 Mg Tabs (Tramadol hcl) .Marland Kitchen.. 1 by mouth three times a day 8)  Diazepam 5 Mg Tabs (Diazepam) .Marland Kitchen.. 1 two times a day as needed 9)  Zolpidem Tartrate 10 Mg Tabs (Zolpidem tartrate) .Marland Kitchen.. 1 by mouth q third night as needed only 10)  Timolol Maleate 0.5 % Soln (Timolol maleate) .Marland Kitchen.. 1 drop in both eyes every morning 11)  Voltaren 1 % Gel (Diclofenac sodium) .... Apply 4 grams to knees qid 12)  Potassium Chloride Cr 10 Meq Cr-tabs (Potassium chloride) .Marland Kitchen.. 1 by mouth once daily 13)  Fluocinonide 0.05 % Oint (Fluocinonide) .... Apply to scalp 14)  Rogaine 2 % Soln (Minoxidil) .... Use on scalp every other day 15)  Hydrocortisone 2.5 % Crea (Hydrocortisone) .... Apply to mouth 2-3 x weekly 16)  Gabapentin 100 Mg Caps (Gabapentin) .Marland Kitchen.. 1 every 8 hrs as needed for nerve pain 17)  Vitamin B-12 500 Mcg Subl (Cyanocobalamin) .Marland Kitchen.. 1 by mouth once daily 18)  Nopalea 3oz  .... Two times a day 19)  Cymbalta 60 Mg Cpep (Duloxetine hcl) .Marland Kitchen.. 1 once daily  Patient Instructions: 1)  Consume LESS THAN  25 grams of High Fructose Corn Syrup sugar/day. Cymbalta 30 mg once daily X 7 days then start 60 mg daily. 2)  Check your Blood Pressure regularly. If it is above: 135/85 ON AVERAGE you should make an appointment. Prescriptions: CYMBALTA 60 MG CPEP (DULOXETINE HCL) 1 once daily  #30 x 5   Entered and Authorized by:   Unice Cobble MD   Signed by:   Unice Cobble MD on 01/20/2010   Method  used:   Print then Give to Patient   RxID:   [id number] VICODIN 5-500 MG TABS (HYDROCODONE-ACETAMINOPHEN) 1 every 6 hrs as needed for back pain  #30 x 0   Entered and Authorized by:   Unice Cobble MD   Signed by:   Unice Cobble MD on 01/20/2010   Method used:   Print then Give to Patient   RxID:   [id number]   Appended Document: Ongoing back pain/scm Prescriptions: GABAPENTIN 100 MG CAPS (GABAPENTIN) 1 every 8 hrs as needed for nerve pain  #30 x 1   Entered by:   Georgette Dover CMA   Authorized by:   Unice Cobble MD   Signed by:   Georgette Dover CMA on 01/20/2010   Method used:   Print then Give to Patient   RxID:   [id number] DIAZEPAM 5 MG TABS (DIAZEPAM) 1 two times a day as needed  #60 x 1   Entered by:   Georgette Dover CMA   Authorized by:   Unice Cobble MD   Signed by:   Georgette Dover CMA on 01/20/2010   Method used:   Print then Give to Patient   RxID:   [id number]  Flu Vaccine Consent Questions     Do you have a history of severe allergic reactions to this vaccine? no    Any prior history of allergic reactions to egg and/or gelatin? no    Do you have a sensitivity to the preservative Thimersol? no    Do you have a past history of Guillan-Barre Syndrome? no    Do you currently have an acute febrile illness? no    Have you ever had a severe reaction to latex? no    Vaccine information given and explained to patient? yes    Are you currently pregnant? no    Lot Number:[id number]   Exp Date:11/14/2010   Site Given  Left Deltoid IM

## 2010-06-16 NOTE — Letter (Signed)
Summary: [facility]  [facility]   Imported By: Phillis Knack 01/23/2010 08:30:00  _____________________________________________________________________  External Attachment:    Type:   Image     Comment:   External Document

## 2010-06-16 NOTE — Progress Notes (Signed)
Summary: Refill Request: Controlled Med  Phone Note Refill Request Call back at [phone] Message from:  Pharmacy on December 15, 2009 10:04 AM  Refills Requested: Medication #1:  VICODIN 5-500 MG TABS as needed FOR PAIN   Dosage confirmed as above?Dosage Confirmed   Supply Requested: 3 months   Last Ref[facility]: [facility]2009 [facility]  Initial call taken by: Osborn Coho,  December 15, 2009 10:05 AM  Follow-up for Phone Call        Dr.Hopper please advise on dispense number, we never filled med for patient before. Follow-up by: Georgette Dover CMA,  December 15, 2009 10:34 AM  Additional Follow-up for Phone Call Additional follow up Details #1::        Because this is a narcotic & potentially addictive  ; the [facility] Medical Board requires documentation for what medical problem  it is taken & how often it is taken . If it is taken chronically it should be  managed through a Pain Clinic as this is the mandated Standard of Care Additional Follow-up by: Unice Cobble MD,  December 15, 2009 1:13 PM    Additional Follow-up for Phone Call Additional follow up Details #2::    Left message on machine for patient to return call when avaliable, Reason for call:   Discuss Dr.Hopper's Response./Chrae [facility] CMA  December 15, 2009 4:46 PM    Spoke with patient, patient ok'd all information. Patient said she was told she has a disc problem and may need surgery but is not wanting to do that at this time. Patient said she uses this med on a regular and would like a referral to the pain clinic, patient would like a refill until appointment to be made with pain clinic./Chrae [facility] CMA  December 15, 2009 5:01 PM   Additional Follow-up for Phone Call Additional follow up Details #3:: Details for Additional Follow-up Action Taken: I reviewed her old records ; she has diagnosis of Spinal Stenosis. This is usually followed by a Neuro Surgeon or Orthopedic Back Specialist . Epidural steroids & Gabapentin are  often effective. We can discuss these options  @ [facility]. Cancel Pain Clinic referral based on this PMH Unice Cobble MD,  December 16, 2009 5:18 AM  RX faxed, referral to pain clinic cancled./Chrae [facility] CMA  December 16, 2009 9:33 AM   New/Updated Medications: VICODIN 5-500 MG TABS (HYDROCODONE-ACETAMINOPHEN) as needed for Back Pain VICODIN 5-500 MG TABS (HYDROCODONE-ACETAMINOPHEN) 1 every 6 hrs as needed for back pain Prescriptions: VICODIN 5-500 MG TABS (HYDROCODONE-ACETAMINOPHEN) 1 every 6 hrs as needed for back pain  #30 x 0   Entered by:   Georgette Dover CMA   Authorized by:   Unice Cobble MD   Signed by:   Georgette Dover CMA on 12/16/2009   Method used:   Printed then faxed to ...       [facility] [phone]* (retail)       [place], [place]  [id number]       Ph: [phone] or [phone]       Fax: [phone]   RxID:   [id number]

## 2010-06-16 NOTE — Letter (Signed)
Summary: [facility]   Imported By: Edmonia James 11/18/2009 09:15:12  _____________________________________________________________________  External Attachment:    Type:   Image     Comment:   External Document

## 2010-06-16 NOTE — Procedures (Signed)
Summary: [facility]   Imported By: Phillis Knack 01/23/2010 08:14:24  _____________________________________________________________________  External Attachment:    Type:   Image     Comment:   External Document

## 2010-06-16 NOTE — Progress Notes (Signed)
Summary: REFILL REQUEST  Phone Note Refill Request Call back at [phone] Message from:  Pharmacy on November 18, 2009 2:58 PM  Refills Requested: Medication #1:  ZOLPIDEM TARTRATE 10 MG TABLET- TAKE 1 TABLET BY MOUTH AT BEDTIME   Dosage confirmed as above?Dosage Confirmed   Supply Requested: 1 month   Last Refilled: 09/22/[facility] [address]  Next Appointment Scheduled: [place] [facility] 2011 Initial call taken by: Osborn Coho,  November 18, 2009 2:59 PM  Follow-up for Phone Call        medication not on medlist spoke w/ patient this was given to her by previous physician will forward to Dr. [name] for approval last office visit 10/16/09  ...Marland KitchenMarland KitchenMalachi Bonds  November 18, 2009 4:10 PM   Additional Follow-up for Phone Call Additional follow up Details #1::        I never Rx this beyond  every 3rd night as needed due to risks of habituation & abnormal sleep behaviors. if sleep aid needed nightly , I always consult a Sleep specialist to define the process optimally & decrease adverse drug reaction risks . #10; 1 q 3rd night as needed only. Additional Follow-up by: Unice Cobble MD,  November 18, 2009 5:45 PM    Additional Follow-up for Phone Call Additional follow up Details #2::    left message on voicemail to call back to office.Ernestene Mention,  November 19, 2009 11:08 AM  Additional Follow-up for Phone Call Additional follow up Details #3:: Details for Additional Follow-up Action Taken: I spoke with patient and discussed Dr.Hopper's response, patient ok'd.Jacky Kindle Malloy  November 19, 2009 11:49 AM   New/Updated Medications: ZOLPIDEM TARTRATE 10 MG TABS (ZOLPIDEM TARTRATE) take one tablet at bedtime ZOLPIDEM TARTRATE 10 MG TABS (ZOLPIDEM TARTRATE) 1 by mouth q third night as needed only Prescriptions: ZOLPIDEM TARTRATE 10 MG TABS (ZOLPIDEM TARTRATE) 1 by mouth q third night as needed only  #10 x 0   Entered by:   Ernestene Mention   Authorized by:   Unice Cobble MD   Signed by:   Ernestene Mention on  11/19/2009   Method used:   Telephoned to ...       [facility] [phone]* (retail)       [place], [place]  [id number]       Ph: [phone] or [phone]       Fax: [phone]   RxID:   [id number]

## 2010-06-16 NOTE — Assessment & Plan Note (Signed)
Summary: F/U on labs/scm   Vital Signs:  Patient profile:   65 year old female Weight:      198.4 pounds Temp:     98.1 degrees F oral Pulse rate:   80 / minute Resp:     16 per minute BP sitting:   150 / 80  (left arm) Cuff size:   large  Vitals Entered By: Georgette Dover CMA (December 19, 2009 10:26 AM) CC: 1.) Follow up on labs (copy given)  2.) Swelling feet/hands x 1 week   CC:  1.) Follow up on labs (copy given)  2.) Swelling feet/hands x 1 week.  History of Present Illness: Labs reviewed ; TSH has risen from 20.90 to 23.86  despite increase in dose . Apparently dose was decreased from 112 micrograms to 100 micrograms when [place] was [place] [facility] [place]. Creatinine has improved dramatically from 1.6 ( 10/22/2009)  to 1.2 (08/03). She has Spinal Stenosis ; Dr Werner Lean administered ESI injection, most recently 05/2009. Dr Anitra Lauth had recommended surgery. She has seen Dr Annette Stable, [place] , who diagnosed herniated disc.  Current Medications (verified): 1)  Chlorthalidone 25 Mg Tabs (Chlorthalidone) .Marland Kitchen.. 1 By Mouth Once Daily 2)  Toprol Xl 100 Mg Xr24h-Tab (Metoprolol Succinate) .Marland Kitchen.. 1 By Mouth Two Times A Day 3)  Diovan 320 Mg Tabs (Valsartan) .Marland Kitchen.. 1 By Mouth Once Daily 4)  Levothyroxine Sodium 112 Mcg Tabs (Levothyroxine Sodium) .Marland Kitchen.. 1 By Mouth Once Daily Except 1&1/2 Tues & Th 5)  Bayer Low Strength 81 Mg Tbec (Aspirin) .Marland Kitchen.. 1 By Mouth Once Daily 6)  Vicodin 5-500 Mg Tabs (Hydrocodone-Acetaminophen) .Marland Kitchen.. 1 Every 6 Hrs As Needed For Back Pain 7)  Ultram 50 Mg Tabs (Tramadol Hcl) .Marland Kitchen.. 1 By Mouth Three Times A Day 8)  Diazepam 5 Mg Tabs (Diazepam) .Marland Kitchen.. 1 Two Times A Day As Needed 9)  Zolpidem Tartrate 10 Mg Tabs (Zolpidem Tartrate) .Marland Kitchen.. 1 By Mouth Q Third Night As Needed Only 10)  Timolol Maleate 0.5 % Soln (Timolol Maleate) .Marland Kitchen.. 1 Drop in Both Eyes Every Morning 11)  Voltaren 1 % Gel (Diclofenac Sodium) .... Apply 4 Grams To Knees Qid 12)  Potassium Chloride Cr 10 Meq Cr-Tabs (Potassium  Chloride) .Marland Kitchen.. 1 By Mouth Once Daily 13)  Phentermine Hcl 37.5 Mg Tabs (Phentermine Hcl) .Marland Kitchen.. 1 By Mouth Once Daily 14)  Mirtazapine 15 Mg Tabs (Mirtazapine) .Marland Kitchen.. 1 By Mouth At Bedtime (Will Take Sometimes Instead of Ambien Generic) 15)  Fluocinonide 0.05 % Oint (Fluocinonide) .... Apply To Scalp 16)  Rogaine 2 % Soln (Minoxidil) .... Use On Scalp Every Other Day 17)  Hydrocortisone 2.5 % Crea (Hydrocortisone) .... Apply To Mouth 2-3 X Weekly  Allergies: 1)  ! Sulfa 2)  ! Penicillin 3)  ! Codeine  Past History:  Past Surgical History: Cholecystectomy-2006 CARPAL TUNNEL SYNDROME, BILATERAL (ICD-354.0), S/P surgery, Dr Annette Stable CERVICAL DISC DISORDER (ICD-722.91), S/P Discectomy X3, Dr Annette Stable Thyroidectomy-2003 for goiter G 0 P 0; Colonoscopy negative 2010  Review of Systems General:  Denies fatigue and weight loss; Weight up 7#. Eyes:  Denies blurring, double vision, and vision loss-both eyes. ENT:  Denies difficulty swallowing and hoarseness. CV:  Occasional palpitations. GI:  Denies constipation and diarrhea. Derm:  Denies changes in nail beds, dryness, and hair loss. Neuro:  Complains of numbness, tingling, and weakness; denies brief paralysis, disturbances in coordination, and poor balance; N&T LLE.  Physical Exam  General:  well-nourished,in no acute distress; alert,appropriate and cooperative throughout examination Neck:  No  deformities, masses, or tenderness noted.Asymmetric thyroid; R  thyroid  > L &firm, ? subcutaneous scar tissue ( she staes PMH of total thyroidectomy) Lungs:  Normal respiratory effort, chest expands symmetrically. Lungs are clear to auscultation, no crackles or wheezes. Heart:  Normal rate and regular rhythm. S1 and S2 normal without gallop, murmur, click, rub . S4 with slurring Neurologic:  alert & oriented X3, strength normal in all extremities, and DTRs asymmetrical ; decreased LLE. SLR + on L   Skin:  Intact without suspicious lesions or rashes Cervical  Nodes:  No lymphadenopathy noted Axillary Nodes:  No palpable lymphadenopathy Psych:  memory intact for recent and remote, normally interactive, and good eye contact.     Impression & Recommendations:  Problem # 1:  HYPOTHYROIDISM (ICD-244.9)  Problem # 2:  HERNIATED LUMBAR DISC (ICD-722.10) as per Dr Annette Stable  Problem # 3:  RENAL INSUFFICIENCY, ACUTE (ICD-585.9) Resolved  Problem # 4:  SPINAL STENOSIS, LUMBAR (ICD-724.02)  Complete Medication List: 1)  Chlorthalidone 25 Mg Tabs (Chlorthalidone) .Marland Kitchen.. 1 by mouth once daily 2)  Toprol Xl 100 Mg Xr24h-tab (Metoprolol succinate) .Marland Kitchen.. 1 by mouth two times a day 3)  Diovan 320 Mg Tabs (Valsartan) .Marland Kitchen.. 1 by mouth once daily 4)  Levothyroxine Sodium 150 Mcg Tabs (levothyroxine Sodium)  .Marland Kitchen.. 1 once daily 5)  Bayer Low Strength 81 Mg Tbec (Aspirin) .Marland Kitchen.. 1 by mouth once daily 6)  Vicodin 5-500 Mg Tabs (Hydrocodone-acetaminophen) .Marland Kitchen.. 1 every 6 hrs as needed for back pain 7)  Ultram 50 Mg Tabs (Tramadol hcl) .Marland Kitchen.. 1 by mouth three times a day 8)  Diazepam 5 Mg Tabs (Diazepam) .Marland Kitchen.. 1 two times a day as needed 9)  Zolpidem Tartrate 10 Mg Tabs (Zolpidem tartrate) .Marland Kitchen.. 1 by mouth q third night as needed only 10)  Timolol Maleate 0.5 % Soln (Timolol maleate) .Marland Kitchen.. 1 drop in both eyes every morning 11)  Voltaren 1 % Gel (Diclofenac sodium) .... Apply 4 grams to knees qid 12)  Potassium Chloride Cr 10 Meq Cr-tabs (Potassium chloride) .Marland Kitchen.. 1 by mouth once daily 13)  Phentermine Hcl 37.5 Mg Tabs (Phentermine hcl) .Marland Kitchen.. 1 by mouth once daily 14)  Mirtazapine 15 Mg Tabs (Mirtazapine) .Marland Kitchen.. 1 by mouth at bedtime (will take sometimes instead of ambien generic) 15)  Fluocinonide 0.05 % Oint (Fluocinonide) .... Apply to scalp 16)  Rogaine 2 % Soln (Minoxidil) .... Use on scalp every other day 17)  Hydrocortisone 2.5 % Crea (Hydrocortisone) .... Apply to mouth 2-3 x weekly 18)  Gabapentin 100 Mg Caps (Gabapentin) .Marland Kitchen.. 1 every 8 hrs as needed for nerve pain  Patient  Instructions: 1)  Please schedule a follow-up  Lab appointment in 2 months. 2)  TSH, ICD-9:244.9 Prescriptions: LEVOTHYROXINE SODIUM 150  MCG TABS (LEVOTHYROXINE SODIUM) 1 once daily  #90 x 0   Entered and Authorized by:   Unice Cobble MD   Signed by:   Unice Cobble MD on 12/19/2009   Method used:   Print then Give to Patient   RxID:   [id number] GABAPENTIN 100 MG CAPS (GABAPENTIN) 1 every 8 hrs as needed for nerve pain  #30 x 1   Entered and Authorized by:   Unice Cobble MD   Signed by:   Unice Cobble MD on 12/19/2009   Method used:   Print then Give to Patient   RxID:   [id number]

## 2010-06-16 NOTE — Progress Notes (Signed)
Summary: Refill request   Phone Note Refill Request Message from:  Fax from Pharmacy on January 14, 2010 4:43 PM  Refills Requested: Medication #1:  PHENTERMINE HCL 37.5 MG TABS 1 by mouth once daily [facility] [address] - fax [phone]  Initial call taken by: Arbie Cookey [place],  January 14, 2010 4:44 PM  Follow-up for Phone Call        please advise. Ernestene Mention CMA  January 15, 2010 9:29 AM   Additional Follow-up for Phone Call Additional follow up Details #1::        Patient's original Dr. Patrick Jupiter was prescribing, patient now would like Dr.Hopper to rx   Left message on machine informing patient rx requires that she have an appointment every 30 days, patient needs to call to futher discuss Additional Follow-up by: Georgette Dover CMA,  January 15, 2010 10:59 AM    Additional Follow-up for Phone Call Additional follow up Details #2::    Unfortunately this medication is contraindicated because of her  HTN. Adverse side effects with this include tachycardia,worsening of HTN, &  possibly valvular heart disease. It also can cause insomnia & dependency. If she wants to continue this she would have to go to a "diet doctor"; but I strongly advise against that because of the known risks with this medication. Follow-up by: Unice Cobble MD,  January 16, 2010 11:04 AM  Additional Follow-up for Phone Call Additional follow up Details #3:: Details for Additional Follow-up Action Taken: I spoke with patient and she said she will d/c med, patient would like to know if Dr.Hopper would recommend another med that will curb appetite that will NOT interfer with HTN  Dr.Hopper please advise Georgette Dover CMA,  January 16, 2010 11:31 AM.  All diet pills are stimulants with these health risks. Please   send her Optifast information Unice Cobble MD,  January 16, 2010 1:05 PM  I spoke with patient and informed her per Dr.Hopper recommends Optifast not a diet pill (stimulant), patient ok'd. I mailed  patient Optifast brochure./Chrae Emmaline Kluver CMA  January 16, 2010 1:59 PM

## 2010-06-16 NOTE — Letter (Signed)
Summary: [facility]  [facility]   Imported By: Phillis Knack 01/23/2010 08:08:25  _____________________________________________________________________  External Attachment:    Type:   Image     Comment:   External Document

## 2010-06-16 NOTE — Letter (Signed)
Summary: [facility]   Imported By: Phillis Knack 01/23/2010 [id number]  _____________________________________________________________________  External Attachment:    Type:   Image     Comment:   External Document

## 2010-06-16 NOTE — Letter (Signed)
Summary: [facility]  [facility]   Imported By: Phillis Knack 01/23/2010 08:30:51  _____________________________________________________________________  External Attachment:    Type:   Image     Comment:   External Document

## 2010-06-16 NOTE — Letter (Signed)
Summary: [facility]  [facility]   Imported By: Phillis Knack 01/23/2010 08:31:45  _____________________________________________________________________  External Attachment:    Type:   Image     Comment:   External Document

## 2010-06-16 NOTE — Letter (Signed)
Summary: [place] [facility]  [facility]   Imported By: Phillis Knack 01/23/2010 08:27:10  _____________________________________________________________________  External Attachment:    Type:   Image     Comment:   External Document

## 2010-06-16 NOTE — Progress Notes (Signed)
Summary: back/knee issue  Phone Note Call from Patient Call back at Home Phone [phone]   Summary of Call: Patient called back to let us know that nothing has changed and  she does want to go see Dr. Onnie Graham. for knee and back related issues. Patient was recently seen for her knee pain despite injection by Dr. Onnie Graham.  FYI--at this time she does not have any back surgery scheduled. Please advise. Initial call taken by: Ernestene Mention CMA,  March 02, 2010 11:37 AM  Follow-up for Phone Call        she needs to discuss seeing Dr Nelva Bush, non surgical back specialist (partner of Dr Onnie Graham) with Dr Onnie Graham as the knee & back issues will impact each other Follow-up by: Unice Cobble MD,  March 02, 2010 12:33 PM  Additional Follow-up for Phone Call Additional follow up Details #1::        Patient notified. Additional Follow-up by: Ernestene Mention CMA,  March 02, 2010 4:44 PM

## 2010-06-16 NOTE — Progress Notes (Signed)
Summary: needs Gabapentin refill due to increasing dose  Phone Note Refill Request Call back at Home Phone [phone] Message from:  Patient on March 11, 2010 2:09 PM  Refills Requested: Medication #1:  GABAPENTIN 100 MG CAPS 1 every 8 hrs as needed for nerve pain patient called and stated that her dose of Gabapentin was increased from one pill to three pills every 8 hours, so her "one -a-day" prescription is being used up quicker---[facility] [address]  Initial call taken by: Berneta Sages,  March 11, 2010 2:38 PM  Follow-up for Phone Call        Dr.Hopper:  Advise on dispense number for med Follow-up by: Georgette Dover CMA,  March 11, 2010 3:23 PM  Additional Follow-up for Phone Call Additional follow up Details #1::        300mg  every 8 hrs as needed #90 Additional Follow-up by: Unice Cobble MD,  March 11, 2010 5:24 PM    New/Updated Medications: GABAPENTIN 300 MG CAPS (GABAPENTIN) 1 by mouth every 8 hour as needed Prescriptions: GABAPENTIN 300 MG CAPS (GABAPENTIN) 1 by mouth every 8 hour as needed  #90 x 0   Entered by:   Georgette Dover CMA   Authorized by:   Unice Cobble MD   Signed by:   Georgette Dover CMA on 03/12/2010   Method used:   Electronically to        [facility] [phone]* (retail)       [place], [place]  [id number]       Ph: [phone] or [phone]       Fax: [phone]   RxID:   [id number]

## 2010-06-16 NOTE — Progress Notes (Signed)
Summary: pt returned call she understands referral  Phone Note Call from Patient Call back at Home Phone [phone]   Caller: Patient Summary of Call: Message left on Voice mail: patient would like more info about orthro referral-? if they will be checking back and knee (patient indicated she does not want to have surgery), please call to futher discuss   I called patient, no answer, no voicemail. I will try to reach patient tomorrow./Chrae [facility] CMA  January 22, 2010 4:49 PM   Follow-up for Phone Call        Left message on machine for patient to return call when avaliable, Reason for call:   discuss patient's concerns Follow-up by: Georgette Dover CMA,  January 23, 2010 4:15 PM  Additional Follow-up for Phone Call Additional follow up Details #1::        patient called back - she understands referral is for her knee not her back .Marland KitchenArbie Cookey Spring  January 23, 2010 4:29 PM   Noted./Chrae Emmaline Kluver CMA  January 23, 2010 5:08 PM

## 2010-06-16 NOTE — Letter (Signed)
Summary: [facility]   Imported By: Phillis Knack 01/23/2010 08:07:37  _____________________________________________________________________  External Attachment:    Type:   Image     Comment:   External Document

## 2010-06-16 NOTE — Progress Notes (Signed)
Summary: refill--Zolpidem  Phone Note Refill Request Message from:  Fax from Pharmacy on April 17, 2010 9:19 AM  Refills Requested: Medication #1:  ZOLPIDEM TARTRATE 10 MG TABS 1 by mouth q third night as needed only [address] - fax [phone]  Initial call taken by: Arbie Cookey [place],  April 17, 2010 9:20 AM    Prescriptions: ZOLPIDEM TARTRATE 10 MG TABS (ZOLPIDEM TARTRATE) 1 by mouth q third night as needed only  #10 x 0   Entered by:   Ernestene Mention CMA   Authorized by:   Unice Cobble MD   Signed by:   Ernestene Mention CMA on 04/17/2010   Method used:   Printed then faxed to ...       [facility] [phone]* (retail)       [place], [place]  [id number]       Ph: [phone] or [phone]       Fax: [phone]   RxID:   [id number]

## 2010-06-16 NOTE — Progress Notes (Signed)
Summary: Refill-Controlled Meds  Phone Note Refill Request Message from:  Fax from Pharmacy on February 24, 2010 11:37 AM  Refills Requested: Medication #1:  ZOLPIDEM TARTRATE 10 MG TABS 1 by mouth q third night as needed only  Medication #2:  VICODIN 5-500 MG TABS 1 every 6 hrs as needed for back pain [facility] [address] - fax [phone]  Initial call taken by: Arbie Cookey [place],  February 24, 2010 11:37 AM  Follow-up for Phone Call        Dr.Hopper please advise on refill request Follow-up by: Georgette Dover CMA,  February 24, 2010 11:40 AM  Additional Follow-up for Phone Call Additional follow up Details #1::        #30 of Vicodin generic & #10  zolpidem Additional Follow-up by: Unice Cobble MD,  February 24, 2010 3:25 PM    Prescriptions: VICODIN 5-500 MG TABS (HYDROCODONE-ACETAMINOPHEN) 1 every 6 hrs as needed for back pain  #30 x 0   Entered by:   Georgette Dover CMA   Authorized by:   Unice Cobble MD   Signed by:   Georgette Dover CMA on 02/24/2010   Method used:   Printed then faxed to ...       [facility] [phone]* (retail)       [place], [place]  [id number]       Ph: [phone] or [phone]       Fax: [phone]   RxID:   [id number] ZOLPIDEM TARTRATE 10 MG TABS (ZOLPIDEM TARTRATE) 1 by mouth q third night as needed only  #10 x 0   Entered by:   Georgette Dover CMA   Authorized by:   Unice Cobble MD   Signed by:   Georgette Dover CMA on 02/24/2010   Method used:   Printed then faxed to ...       [facility] [phone]* (retail)       [place], [place]  [id number]       Ph: [phone] or [phone]       Fax: [phone]   RxID:   [id number]

## 2010-06-16 NOTE — Procedures (Signed)
Summary: [facility] Summary  [facility] Monitor Summary   Imported By: Phillis Knack 01/23/2010 08:13:24  _____________________________________________________________________  External Attachment:    Type:   Image     Comment:   External Document

## 2010-06-16 NOTE — Letter (Signed)
Summary: [facility]   Imported By: Edmonia James 03/31/2010 10:27:47  _____________________________________________________________________  External Attachment:    Type:   Image     Comment:   External Document

## 2010-06-17 HISTORY — PX: CERVICAL DISCECTOMY: SHX98

## 2010-06-18 NOTE — Progress Notes (Signed)
Summary: chlorthalidone , metoprolol tart 100 mg  refill  Phone Note Refill Request Message from:  Fax from Pharmacy on June 10, 2010 12:36 PM  Refills Requested: Medication #1:  CHLORTHALIDONE 25 MG TABS 1 by mouth once daily   Notes: qty = 30  Medication #2:  TOPROL XL 100 MG XR24H-TAB 1 by mouth two times a day   Notes: qty = 60 [id number], [place], [place]   phone - [phone]    fax - [phone]      Next Appointment Scheduled: none Initial call taken by: Berneta Sages,  June 10, 2010 12:36 PM    Prescriptions: TOPROL XL 100 MG XR24H-TAB (METOPROLOL SUCCINATE) 1 by mouth two times a day  #60 Tablet x 5   Entered by:   [place] by:   Unice Cobble MD   Signed by:   Georgette Dover CMA on 06/10/2010   Method used:   Electronically to        [facility] [phone]* (retail)       [place], [place]  [id number]       Ph: [phone] or [phone]       Fax: [phone]   RxID:   [id number] CHLORTHALIDONE 25 MG TABS (CHLORTHALIDONE) 1 by mouth once daily  #30 x 5   Entered by:   Georgette Dover CMA   Authorized by:   Unice Cobble MD   Signed by:   Georgette Dover CMA on 06/10/2010   Method used:   Electronically to        [facility] [phone]* (retail)       [place], [place]  [id number]       Ph: [phone] or [phone]       Fax: [phone]   RxID:   [id number]

## 2010-06-18 NOTE — Miscellaneous (Signed)
Summary: Orders Update pft charges  Clinical Lists Changes  Orders: Added new Service order of Carbon Monoxide diffusing w/capacity (94720) - Signed Added new Service order of Lung Volumes (94240) - Signed Added new Service order of Spirometry (Pre & Post) (94060) - Signed 

## 2010-06-18 NOTE — Assessment & Plan Note (Signed)
Summary: FOLLOWUP ON PNEMONIA/KN   Vital Signs:  Patient profile:   65 year old female Weight:      210 pounds BMI:     36.18 O2 Sat:      95 % on Room air Temp:     98.4 degrees F oral Pulse rate:   97 / minute Resp:     16 per minute BP sitting:   138 / 92  (left arm) Cuff size:   large  Vitals Entered By: Gifford (May 26, 2010 9:45 AM)  O2 Flow:  Room air CC: 1.) Follow-up visit: patient here to f/u on pneumonia. Patient c/o: Fatigue, wheezing, leg/back pain, and sweating with little activity  2.) Patient would like to discuss alternative for Diovan due to price increase, patient questions if she needs to continue potassium ( price increase also). 3.) Refill gabapentin and discuss sleep habits ( ? Ambien) , COPD follow-up   CC:  1.) Follow-up visit: patient here to f/u on pneumonia. Patient c/o: Fatigue, wheezing, leg/back pain, and sweating with little activity  2.) Patient would like to discuss alternative for Diovan due to price increase, patient questions if she needs to continue potassium ( price increase also). 3.) Refill gabapentin and discuss sleep habits ( ? Ambien) , and COPD follow-up.  History of Present Illness:      This is a 65 year old woman who presents for  pneumonia followup. December 2012  OV reviewed; S/P Avelox . The patient reports chest tightness, wheezing, cough, and exercise induced symptoms, but denies shortness of breath, increased sputum, and nocturnal awakening.  Medication use includes quick relief med daily ( albuterol bid )  . She has not been using controller med (Symbicort)  @ all. No PMH of asthma.She smoked for 30 years , < 1 ppd, quitting in  1999.      She is having persistent fatigue even  without physical  activity  The patient denies fever, night sweats, weight loss, and hemoptysis.  The patient denies the following symptoms: leg swelling, melena, adenopathy, severe snoring, and skin changes. Labs reviewed : TSH 23.86.  ROS  re  thyroid function completed     Allergies: 1)  ! Sulfa 2)  ! Penicillin 3)  ! Codeine  Review of Systems General:  Denies chills. Eyes:  Denies blurring and double vision. ENT:  Denies difficulty swallowing and hoarseness. GI:  Denies constipation and diarrhea. Derm:  Complains of hair loss; denies changes in nail beds and dryness. Neuro:  Denies numbness and tingling; F/U with Dr Annette Stable, NS to be scheduled. Endo:  Complains of heat intolerance; denies cold intolerance.  Physical Exam  General:  well-nourished;alert,appropriate and cooperative throughout examination Eyes:  No corneal or conjunctival inflammation noted. No lid lag Ears:  External ear exam shows no significant lesions or deformities.  Otoscopic examination reveals clear canals, tympanic membranes are intact bilaterally without bulging, retraction, inflammation or discharge. Hearing is grossly normal bilaterally. Nose:  External nasal examination shows no deformity or inflammation. Nasal mucosa are pink and moist without lesions or exudates. Mouth:  Oral mucosa and oropharynx without lesions or exudates.  Teeth in good repair. Neck:  No deformities, masses, or tenderness noted. Thyroid full w/o nodules Lungs:  Normal respiratory effort, chest expands symmetrically. Lungs are clear to auscultation, no crackles or wheezes. Heart:  Normal rate and regular rhythm. S1 and S2 normal without gallop, murmur, click, rub .S4 Extremities:  No clubbing, cyanosis, edema. Neurologic:  alert &  oriented X3 and DTRs symmetrical ; 1/2+ @ knees Skin:  Intact without suspicious lesions or rashes Cervical Nodes:  No lymphadenopathy noted Axillary Nodes:  No palpable lymphadenopathy Psych:  memory intact for recent and remote, normally interactive, and good eye contact.     Impression & Recommendations:  Problem # 1:  DYSPNEA ON EXERTION (ICD-786.09) clinically no active bronchitis or pneumonia Her updated medication list for this problem  includes:    Chlorthalidone 25 Mg Tabs (Chlorthalidone) .Marland Kitchen... 1 by mouth once daily    Toprol Xl 100 Mg Xr24h-tab (Metoprolol succinate) .Marland Kitchen... 1 by mouth two times a day    Symbicort 160-4.5 Mcg/act Aero (Budesonide-formoterol fumarate) .Marland Kitchen... 1-2 puffs every 12 hrs ; gargle & spit after use  Orders: Misc. Referral (Misc. Ref) T-2 View CXR (71020TC)  Problem # 2:  HYPOTHYROIDISM (ICD-244.9) uncorrected; probable cause of fatigue Her updated medication list for this problem includes:    Levothyroxine Sodium 150 Mcg Tabs (Levothyroxine sodium) .Marland Kitchen... 1 once daily except 1&  1/2 every on tues & sat  Problem # 3:  HERNIATED LUMBAR DISC (ICD-722.10)  Orders: Neurosurgeon Referral (Neurosurgeon)  Complete Medication List: 1)  Chlorthalidone 25 Mg Tabs (Chlorthalidone) .Marland Kitchen.. 1 by mouth once daily 2)  Toprol Xl 100 Mg Xr24h-tab (Metoprolol succinate) .Marland Kitchen.. 1 by mouth two times a day 3)  Levothyroxine Sodium 150 Mcg Tabs (Levothyroxine sodium) .Marland Kitchen.. 1 once daily except 1&  1/2 every on tues & sat 4)  Bayer Low Strength 81 Mg Tbec (Aspirin) .Marland Kitchen.. 1 by mouth once daily 5)  Vicodin 5-500 Mg Tabs (Hydrocodone-acetaminophen) .Marland Kitchen.. 1 every 6 hrs as needed for back pain 6)  Diazepam 5 Mg Tabs (Diazepam) .Marland Kitchen.. 1 two times a day as needed 7)  Zolpidem Tartrate 10 Mg Tabs (Zolpidem tartrate) .Marland Kitchen.. 1 by mouth q third night as needed only 8)  Timolol Maleate 0.5 % Soln (Timolol maleate) .Marland Kitchen.. 1 drop in both eyes every morning 9)  Voltaren 1 % Gel (Diclofenac sodium) .... Apply 4 grams to knees qid 10)  Potassium Chloride Cr 10 Meq Cr-tabs (Potassium chloride) .Marland Kitchen.. 1 by mouth once daily 11)  Fluocinonide 0.05 % Oint (Fluocinonide) .... Apply to scalp 12)  Rogaine 2 % Soln (Minoxidil) .... Use on scalp every other day 13)  Hydrocortisone 2.5 % Crea (Hydrocortisone) .... Apply to mouth 2-3 x weekly 14)  Gabapentin 300 Mg Caps (Gabapentin) .Marland Kitchen.. 1 by mouth every 8 hour as needed 15)  Vitamin B-12 500 Mcg Subl  (Cyanocobalamin) .Marland Kitchen.. 1 by mouth once daily 16)  Nopalea 3oz  .... Two times a day 17)  Cymbalta 60 Mg Cpep (Duloxetine hcl) .Marland Kitchen.. 1 once daily 18)  Azithromycin 250 Mg Tabs (Azithromycin) .... As directed (prescribed in hospital) 19)  Prednisone 20 Mg Tabs (Prednisone) .Marland Kitchen.. 1 two times a day with a meal 20)  Symbicort 160-4.5 Mcg/act Aero (Budesonide-formoterol fumarate) .Marland Kitchen.. 1-2 puffs every 12 hrs ; gargle & spit after use 21)  Avelox 400 Mg Tabs (moxifloxacin Hcl)  .Marland Kitchen.. 1 pill once daily x 5 22)  Losartan Potassium 100 Mg Tabs (Losartan potassium) .Marland Kitchen.. 1 once daily  in place of diovan  Patient Instructions: 1)  TSH  in 8 weeks (244.9). Keep appt with Dr Annette Stable. 2)  Check your Blood Pressure regularly. If it is above: 135/85 ON AVERAGE  you should make an appointment. Prescriptions: POTASSIUM CHLORIDE CR 10 MEQ CR-TABS (POTASSIUM CHLORIDE) 1 by mouth once daily  #90 x 1   Entered and Authorized by:  Unice Cobble MD   Signed by:   Unice Cobble MD on 05/26/2010   Method used:   Print then Give to Patient   RxID:   [id number] LOSARTAN POTASSIUM 100 MG TABS (LOSARTAN POTASSIUM) 1 once daily  in place of Diovan  #30 x 5   Entered and Authorized by:   Unice Cobble MD   Signed by:   Unice Cobble MD on 05/26/2010   Method used:   Print then Give to Patient   RxID:   [id number] GABAPENTIN 300 MG CAPS (GABAPENTIN) 1 by mouth every 8 hour as needed  #90 x 3   Entered and Authorized by:   Unice Cobble MD   Signed by:   Unice Cobble MD on 05/26/2010   Method used:   Print then Give to Patient   RxID:   [id number] LEVOTHYROXINE SODIUM 150 MCG TABS (LEVOTHYROXINE SODIUM) 1 once daily EXCEPT 1&  1/2 every on Tues & Sat  #90 x 1   Entered and Authorized by:   Unice Cobble MD   Signed by:   Unice Cobble MD on 05/26/2010   Method used:   Print then Give to Patient   RxID:   [id number]    Orders Added: 1)  Est. Patient Level IV [id number] 2)  Misc. Referral  [Misc. Ref] 3)  Neurosurgeon Referral [Neurosurgeon] 4)  T-2 View CXR [71020TC]

## 2010-06-18 NOTE — Assessment & Plan Note (Signed)
Summary: POSSIBLE URI/KB   Vital Signs:  Patient profile:   65 year old female Height:      64 inches (162.56 cm) Weight:      211.25 pounds (96.02 kg) BMI:     36.39 Temp:     102.4 degrees F (39.11 degrees C) oral Resp:     15 per minute BP sitting:   150 / 78  (left arm) Cuff size:   large  Vitals Entered By: Ernestene Mention CMA (April 29, 2010 12:17 PM) CC: Possible URI./kb, URI symptoms Comments Patient notes that she has been having congestion, fever, cough with dark brown mucous production, and SOB. She denies HA and chest pain. Patient does not discomfort with cough.   CC:  Possible URI./kb and URI symptoms.  History of Present Illness:      This is a 65 year old woman who presents with RTI  symptoms; onset 04/25/2010 as dry cough.  The patient now reports nasal congestion and productive cough with brown sputum, but denies earache.  Associated symptoms include fever of 100.5-103 degrees, slight wheezing  and dyspnea. The patient also reports  L frontal headache.  The patient denies the following risk factors for Strep sinusitis: unilateral facial pain, tooth pain, and tender adenopathy.  Rx: Robitussin & Mucinex  Current Medications (verified): 1)  Chlorthalidone 25 Mg Tabs (Chlorthalidone) .Marland Kitchen.. 1 By Mouth Once Daily 2)  Toprol Xl 100 Mg Xr24h-Tab (Metoprolol Succinate) .Marland Kitchen.. 1 By Mouth Two Times A Day 3)  Diovan 320 Mg Tabs (Valsartan) .Marland Kitchen.. 1 By Mouth Once Daily 4)  Levothyroxine Sodium 150 Mcg Tabs (Levothyroxine Sodium) .Marland Kitchen.. 1 Once Daily Except 1/2 Every Weds 5)  Bayer Low Strength 81 Mg Tbec (Aspirin) .Marland Kitchen.. 1 By Mouth Once Daily 6)  Vicodin 5-500 Mg Tabs (Hydrocodone-Acetaminophen) .Marland Kitchen.. 1 Every 6 Hrs As Needed For Back Pain 7)  Diazepam 5 Mg Tabs (Diazepam) .Marland Kitchen.. 1 Two Times A Day As Needed 8)  Zolpidem Tartrate 10 Mg Tabs (Zolpidem Tartrate) .Marland Kitchen.. 1 By Mouth Q Third Night As Needed Only 9)  Timolol Maleate 0.5 % Soln (Timolol Maleate) .Marland Kitchen.. 1 Drop in Both Eyes Every  Morning 10)  Voltaren 1 % Gel (Diclofenac Sodium) .... Apply 4 Grams To Knees Qid 11)  Potassium Chloride Cr 10 Meq Cr-Tabs (Potassium Chloride) .Marland Kitchen.. 1 By Mouth Once Daily 12)  Fluocinonide 0.05 % Oint (Fluocinonide) .... Apply To Scalp 13)  Rogaine 2 % Soln (Minoxidil) .... Use On Scalp Every Other Day 14)  Hydrocortisone 2.5 % Crea (Hydrocortisone) .... Apply To Mouth 2-3 X Weekly 15)  Gabapentin 300 Mg Caps (Gabapentin) .Marland Kitchen.. 1 By Mouth Every 8 Hour As Needed 16)  Vitamin B-12 500 Mcg Subl (Cyanocobalamin) .Marland Kitchen.. 1 By Mouth Once Daily 17)  Nopalea 3oz .... Two Times A Day 18)  Cymbalta 60 Mg Cpep (Duloxetine Hcl) .Marland Kitchen.. 1 Once Daily  Allergies (verified): 1)  ! Sulfa 2)  ! Penicillin 3)  ! Codeine  Physical Exam  General:  in no acute distress; alert,appropriate and cooperative throughout examination Ears:  External ear exam shows no significant lesions or deformities.  Otoscopic examination reveals clear canals, tympanic membranes are intact bilaterally without bulging, retraction, inflammation or discharge. Hearing is grossly normal bilaterally. Nose:  External nasal examination shows no deformity or inflammation. Nasal mucosa are pink and moist without lesions or exudates. Mouth:  Oral mucosa and oropharynx without lesions or exudates.  Teeth in good repair.Minimal pharyngeal erythema.   Lungs:  Normal respiratory effort, chest expands  symmetrically. Lungs are clear to auscultation, NO crackles or wheezes. Heart:  Normal rate and regular rhythm. S1 and S2 normal without gallop, murmur, click, rub . S4 Extremities:  No clubbing, cyanosis, edema. Cervical Nodes:  No lymphadenopathy noted Axillary Nodes:  No palpable lymphadenopathy   Impression & Recommendations:  Problem # 1:  BRONCHITIS-ACUTE (ICD-466.0)  Her updated medication list for this problem includes:    Clarithromycin 500 Mg Xr24h-tab (Clarithromycin) .Marland Kitchen... 2 once daily with food  Problem # 2:  SINUSITIS- ACUTE-NOS  (ICD-461.9)  Her updated medication list for this problem includes:    Clarithromycin 500 Mg Xr24h-tab (Clarithromycin) .Marland Kitchen... 2 once daily with food  Complete Medication List: 1)  Chlorthalidone 25 Mg Tabs (Chlorthalidone) .Marland Kitchen.. 1 by mouth once daily 2)  Toprol Xl 100 Mg Xr24h-tab (Metoprolol succinate) .Marland Kitchen.. 1 by mouth two times a day 3)  Diovan 320 Mg Tabs (Valsartan) .Marland Kitchen.. 1 by mouth once daily 4)  Levothyroxine Sodium 150 Mcg Tabs (Levothyroxine sodium) .Marland Kitchen.. 1 once daily except 1/2 every weds 5)  Bayer Low Strength 81 Mg Tbec (Aspirin) .Marland Kitchen.. 1 by mouth once daily 6)  Vicodin 5-500 Mg Tabs (Hydrocodone-acetaminophen) .Marland Kitchen.. 1 every 6 hrs as needed for back pain 7)  Diazepam 5 Mg Tabs (Diazepam) .Marland Kitchen.. 1 two times a day as needed 8)  Zolpidem Tartrate 10 Mg Tabs (Zolpidem tartrate) .Marland Kitchen.. 1 by mouth q third night as needed only 9)  Timolol Maleate 0.5 % Soln (Timolol maleate) .Marland Kitchen.. 1 drop in both eyes every morning 10)  Voltaren 1 % Gel (Diclofenac sodium) .... Apply 4 grams to knees qid 11)  Potassium Chloride Cr 10 Meq Cr-tabs (Potassium chloride) .Marland Kitchen.. 1 by mouth once daily 12)  Fluocinonide 0.05 % Oint (Fluocinonide) .... Apply to scalp 13)  Rogaine 2 % Soln (Minoxidil) .... Use on scalp every other day 14)  Hydrocortisone 2.5 % Crea (Hydrocortisone) .... Apply to mouth 2-3 x weekly 15)  Gabapentin 300 Mg Caps (Gabapentin) .Marland Kitchen.. 1 by mouth every 8 hour as needed 16)  Vitamin B-12 500 Mcg Subl (Cyanocobalamin) .Marland Kitchen.. 1 by mouth once daily 17)  Nopalea 3oz  .... Two times a day 18)  Cymbalta 60 Mg Cpep (Duloxetine hcl) .Marland Kitchen.. 1 once daily 19)  Clarithromycin 500 Mg Xr24h-tab (Clarithromycin) .... 2 once daily with food  Patient Instructions: 1)  Drink as much NON dairy  fluid as you can tolerate for the next few days. 2)  Take 650-1000mg  of Tylenol every 4-6 hours as needed for relief of pain or comfort of fever AVOID taking more than 4000mg   in a 24 hour period (can cause liver damage in higher  doses) OR take 400-600mg  of Ibuprofen (Advil, Motrin) with food every 4-6 hours as needed for relief of pain or comfort of fever. Prescriptions: CLARITHROMYCIN 500 MG XR24H-TAB (CLARITHROMYCIN) 2 once daily with food  #20 x 0   Entered and Authorized by:   Unice Cobble MD   Signed by:   Unice Cobble MD on 05/02/2010   Method used:   Historical   RxID:   [id number]    Orders Added: 1)  Est. Patient Level III [id number]  Appended Document: POSSIBLE URI/KB I called [facility]; Ambien & Hydrocodone were  filled. She had to take Biaxin Rx to [facility] due to back order of agent.

## 2010-06-18 NOTE — Assessment & Plan Note (Signed)
Summary: PNEUMONIA//[facility]   Vital Signs:  Patient profile:   65 year old female Weight:      207.2 pounds BMI:     35.69 O2 Sat:      93 % on Room air Temp:     98.4 degrees F oral Pulse rate:   75 / minute Resp:     16 per minute BP sitting:   130 / 78  (left arm) Cuff size:   large  Vitals Entered By: Georgette Dover CMA (May 04, 2010 2:42 PM)  O2 Flow:  Room air CC: SOB and productive cough, seen in the hospital, COPD follow-up   CC:  SOB and productive cough, seen in the hospital, and COPD follow-up.  History of Present Illness:      This is a 65 year old woman who presents for PNA  follow-up.  The patient reports shortness of breath, wheezing, cough, increased sputum which is clearer, turning yellow from brown.Also she has had  nocturnal awakening.  Medication use includes quick relief med , Ventolin MDI sample from ER,  up to every 2 hrs or less. [facility] reviewed . Xray @ UC had suggested  R sided PNA. Zithromax drip in ER ; Rx for 4 pills . Anemia documented , Hgb 9.8.  Remote smoker, D/Ced 30 years ago. No PMH of asthma. Flu shot up to date.  Current Medications (verified): 1)  Chlorthalidone 25 Mg Tabs (Chlorthalidone) .Marland Kitchen.. 1 By Mouth Once Daily 2)  Toprol Xl 100 Mg Xr24h-Tab (Metoprolol Succinate) .Marland Kitchen.. 1 By Mouth Two Times A Day 3)  Diovan 320 Mg Tabs (Valsartan) .Marland Kitchen.. 1 By Mouth Once Daily 4)  Levothyroxine Sodium 150 Mcg Tabs (Levothyroxine Sodium) .Marland Kitchen.. 1 Once Daily Except 1/2 Every Weds 5)  Bayer Low Strength 81 Mg Tbec (Aspirin) .Marland Kitchen.. 1 By Mouth Once Daily 6)  Vicodin 5-500 Mg Tabs (Hydrocodone-Acetaminophen) .Marland Kitchen.. 1 Every 6 Hrs As Needed For Back Pain 7)  Diazepam 5 Mg Tabs (Diazepam) .Marland Kitchen.. 1 Two Times A Day As Needed 8)  Zolpidem Tartrate 10 Mg Tabs (Zolpidem Tartrate) .Marland Kitchen.. 1 By Mouth Q Third Night As Needed Only 9)  Timolol Maleate 0.5 % Soln (Timolol Maleate) .Marland Kitchen.. 1 Drop in Both Eyes Every Morning 10)  Voltaren 1 % Gel (Diclofenac Sodium) .... Apply 4 Grams To  Knees Qid 11)  Potassium Chloride Cr 10 Meq Cr-Tabs (Potassium Chloride) .Marland Kitchen.. 1 By Mouth Once Daily 12)  Fluocinonide 0.05 % Oint (Fluocinonide) .... Apply To Scalp 13)  Rogaine 2 % Soln (Minoxidil) .... Use On Scalp Every Other Day 14)  Hydrocortisone 2.5 % Crea (Hydrocortisone) .... Apply To Mouth 2-3 X Weekly 15)  Gabapentin 300 Mg Caps (Gabapentin) .Marland Kitchen.. 1 By Mouth Every 8 Hour As Needed 16)  Vitamin B-12 500 Mcg Subl (Cyanocobalamin) .Marland Kitchen.. 1 By Mouth Once Daily 17)  Nopalea 3oz .... Two Times A Day 18)  Cymbalta 60 Mg Cpep (Duloxetine Hcl) .Marland Kitchen.. 1 Once Daily 19)  Azithromycin 250 Mg Tabs (Azithromycin) .... As Directed (Prescribed in Hospital)  Allergies: 1)  ! Sulfa 2)  ! Penicillin 3)  ! Codeine  Physical Exam  General:  in no acute distress; alert,appropriate and cooperative throughout examination Ears:  External ear exam shows no significant lesions or deformities.  Otoscopic examination reveals clear canals, tympanic membranes are intact bilaterally without bulging, retraction, inflammation or discharge. Hearing is grossly normal bilaterally. Nose:  External nasal examination shows no deformity or inflammation. Nasal mucosa  mildly erythematous  without lesions or  exudates. Mouth:  Oral mucosa and oropharynx without lesions or exudates.  Teeth in good repair. Lungs:  Normal respiratory effort, chest expands symmetrically. Lungs are clear to auscultation, no crackles or wheezes but deep breaths induce cough . Mild upper airway rattle/rhonchi Heart:  Normal rate and regular rhythm. S1 and S2 normal without gallop, murmur, click, rub or other extra sounds. Extremities:  No clubbing, cyanosis, edema. Cervical Nodes:  No lymphadenopathy noted Axillary Nodes:  No palpable lymphadenopathy   Impression & Recommendations:  Problem # 1:  PNEUMONIA (ICD-486)  The following medications were removed from the medication list:    Clarithromycin 500 Mg Xr24h-tab (Clarithromycin) .Marland Kitchen... 2 once  daily with food Her updated medication list for this problem includes:    Azithromycin 250 Mg Tabs (Azithromycin) .Marland Kitchen... As directed (prescribed in hospital)  Problem # 2:  BRONCHITIS-ACUTE (ICD-466.0) RAD component The following medications were removed from the medication list:    Clarithromycin 500 Mg Xr24h-tab (Clarithromycin) .Marland Kitchen... 2 once daily with food Her updated medication list for this problem includes:    Azithromycin 250 Mg Tabs (Azithromycin) .Marland Kitchen... As directed (prescribed in hospital)    Symbicort 160-4.5 Mcg/act Aero (Budesonide-formoterol fumarate) .Marland Kitchen... 1-2 puffs every 12 hrs ; gargle & spit after use  Complete Medication List: 1)  Chlorthalidone 25 Mg Tabs (Chlorthalidone) .Marland Kitchen.. 1 by mouth once daily 2)  Toprol Xl 100 Mg Xr24h-tab (Metoprolol succinate) .Marland Kitchen.. 1 by mouth two times a day 3)  Diovan 320 Mg Tabs (Valsartan) .Marland Kitchen.. 1 by mouth once daily 4)  Levothyroxine Sodium 150 Mcg Tabs (Levothyroxine sodium) .Marland Kitchen.. 1 once daily except 1/2 every weds 5)  Bayer Low Strength 81 Mg Tbec (Aspirin) .Marland Kitchen.. 1 by mouth once daily 6)  Vicodin 5-500 Mg Tabs (Hydrocodone-acetaminophen) .Marland Kitchen.. 1 every 6 hrs as needed for back pain 7)  Diazepam 5 Mg Tabs (Diazepam) .Marland Kitchen.. 1 two times a day as needed 8)  Zolpidem Tartrate 10 Mg Tabs (Zolpidem tartrate) .Marland Kitchen.. 1 by mouth q third night as needed only 9)  Timolol Maleate 0.5 % Soln (Timolol maleate) .Marland Kitchen.. 1 drop in both eyes every morning 10)  Voltaren 1 % Gel (Diclofenac sodium) .... Apply 4 grams to knees qid 11)  Potassium Chloride Cr 10 Meq Cr-tabs (Potassium chloride) .Marland Kitchen.. 1 by mouth once daily 12)  Fluocinonide 0.05 % Oint (Fluocinonide) .... Apply to scalp 13)  Rogaine 2 % Soln (Minoxidil) .... Use on scalp every other day 14)  Hydrocortisone 2.5 % Crea (Hydrocortisone) .... Apply to mouth 2-3 x weekly 15)  Gabapentin 300 Mg Caps (Gabapentin) .Marland Kitchen.. 1 by mouth every 8 hour as needed 16)  Vitamin B-12 500 Mcg Subl (Cyanocobalamin) .Marland Kitchen.. 1 by mouth once  daily 17)  Nopalea 3oz  .... Two times a day 18)  Cymbalta 60 Mg Cpep (Duloxetine hcl) .Marland Kitchen.. 1 once daily 19)  Azithromycin 250 Mg Tabs (Azithromycin) .... As directed (prescribed in hospital) 20)  Prednisone 20 Mg Tabs (Prednisone) .Marland Kitchen.. 1 two times a day with a meal 21)  Symbicort 160-4.5 Mcg/act Aero (Budesonide-formoterol fumarate) .Marland Kitchen.. 1-2 puffs every 12 hrs ; gargle & spit after use 22)  Avelox 400 Mg Tabs (moxifloxacin Hcl)  .Marland Kitchen.. 1 pill once daily x 5  Patient Instructions: 1)  Aveox in place of Zithromax. Use Ventolin no closer than every 4 hrs as needed . 2)  Drink as much fluid as you can tolerate for the next few days. Prescriptions: AVELOX 400 MG TABS (MOXIFLOXACIN HCL) 1 pill once daily X 5  #  5 x 0   Entered and Authorized by:   Unice Cobble MD   Signed by:   Unice Cobble MD on 05/04/2010   Method used:   Print then Give to Patient   RxID:   [id number] SYMBICORT 160-4.5 MCG/ACT AERO (BUDESONIDE-FORMOTEROL FUMARATE) 1-2 puffs every 12 hrs ; gargle & spit after use  #1 x 5   Entered and Authorized by:   Unice Cobble MD   Signed by:   Unice Cobble MD on 05/04/2010   Method used:   Samples Given   RxID:   [id number] PREDNISONE 20 MG TABS (PREDNISONE) 1 two times a day with a meal  #14 x 0   Entered and Authorized by:   Unice Cobble MD   Signed by:   Unice Cobble MD on 05/04/2010   Method used:   Faxed to ...       [facility] [phone]* (retail)       [place], [place]  [id number]       Ph: [phone] or [phone]       Fax: [phone]   RxID:   [id number]    Orders Added: 1)  Est. Patient Level III [id number]

## 2010-06-18 NOTE — Progress Notes (Signed)
Summary: refill  Phone Note Refill Request Message from:  Fax from Pharmacy on June 10, 2010 9:53 AM  Refills Requested: Medication #1:  ZOLPIDEM TARTRATE 10 MG TABS 1 by mouth q third night as needed only  Medication #2:  DIAZEPAM 5 MG TABS 1 two times a day as needed  Medication #3:  VICODIN 5-500 MG TABS 1 every 6 hrs as needed for back pain [facility] [address] - fax [phone] - phone [phone]  Initial call taken by: Arbie [place],  June 10, 2010 9:54 AM    Prescriptions: ZOLPIDEM TARTRATE 10 MG TABS (ZOLPIDEM TARTRATE) 1 by mouth q third night as needed only  #10 x 0   Entered by:   Georgette Dover CMA   Authorized by:   Unice Cobble MD   Signed by:   Georgette Dover CMA on 06/10/2010   Method used:   Printed then faxed to ...       [facility] [phone]* (retail)       [place], [place]  [id number]       Ph: [phone] or [phone]       Fax: [phone]   RxID:   [id number] DIAZEPAM 5 MG TABS (DIAZEPAM) 1 two times a day as needed  #60 x 1   Entered by:   Georgette Dover CMA   Authorized by:   Unice Cobble MD   Signed by:   Georgette Dover CMA on 06/10/2010   Method used:   Printed then faxed to ...       [facility] [phone]* (retail)       [place], [place]  [id number]       Ph: [phone] or [phone]       Fax: [phone]   RxID:   [id number] VICODIN 5-500 MG TABS (HYDROCODONE-ACETAMINOPHEN) 1 every 6 hrs as needed for back pain  #30 x 0   Entered by:   Georgette Dover CMA   Authorized by:   Unice Cobble MD   Signed by:   Georgette Dover CMA on 06/10/2010   Method used:   Printed then faxed to ...       [facility] [phone]* (retail)       [place], [place]  [id number]       Ph: [phone] or [phone]       Fax: [phone]   RxID:   [id number]

## 2010-06-18 NOTE — Progress Notes (Signed)
Summary: URI symptoms are worse  Phone Note Call from Patient Call back at Home Phone [phone]   Caller: patient and sister Summary of Call: Sister called to speak for patient who was in background---called to report that patient is not getting any better and is actually getting worse---woke up Sunday morning and had to go to Urgent Care due to breathing problems  Sister said they talked about admiting her-- but then decided not to admit her;  said they were talking about pneumonia  (did not clarify if this was a diagnosis or just a "suggestion to rule out"  Patient called so she could speak to Dr [name] Initial call taken by: Berneta Sages,  May 04, 2010 10:39 AM  Follow-up for Phone Call        Any recommendations? Ernestene Mention CMA  May 04, 2010 10:58 AM   MD notes that the patient will need CXR and cbc diff if not done at Urgent Care. I spoke with the patient and she will come today for cbc and bring the CXR report from UC with her. Ernestene Mention CMA  May 04, 2010 11:37 AM

## 2010-06-25 ENCOUNTER — Encounter (HOSPITAL_COMMUNITY)
Admission: RE | Admit: 2010-06-25 | Discharge: 2010-06-25 | Disposition: A | Discharge status: Home / Self Care | Payer: Medicare Other | Source: Ambulatory Visit | Attending: Neurosurgery | Admitting: Neurosurgery

## 2010-06-25 DIAGNOSIS — Z01812 Encounter for preprocedural laboratory examination: Secondary | ICD-10-CM | POA: Insufficient documentation

## 2010-06-25 LAB — CBC
HCT: 34 % — ABNORMAL LOW (ref 36.0–46.0)
Hemoglobin: 11.6 g/dL — ABNORMAL LOW (ref 12.0–15.0)
MCH: 31.7 pg (ref 26.0–34.0)
MCHC: 34.1 g/dL (ref 30.0–36.0)
MCV: 92.9 fL (ref 78.0–100.0)
Platelets: 302 10*3/uL (ref 150–400)
RBC: 3.66 MIL/uL — ABNORMAL LOW (ref 3.87–5.11)
RDW: 13.8 % (ref 11.5–15.5)
WBC: 3.7 10*3/uL — ABNORMAL LOW (ref 4.0–10.5)

## 2010-06-25 LAB — BASIC METABOLIC PANEL
BUN: 14 mg/dL (ref 6–23)
CO2: 31 mEq/L (ref 19–32)
Calcium: 9.9 mg/dL (ref 8.4–10.5)
Chloride: 100 mEq/L (ref 96–112)
Creatinine, Ser: 0.92 mg/dL (ref 0.4–1.2)
GFR calc Af Amer: >60 mL/min (ref >60)
GFR calc non Af Amer: >60 mL/min (ref >60)
Glucose, Bld: 106 mg/dL — ABNORMAL HIGH (ref 70–99)
Potassium: 3.9 mEq/L (ref 3.5–5.1)
Sodium: 140 mEq/L (ref 135–145)

## 2010-06-25 LAB — TYPE AND SCREEN
ABO/RH(D): O POS
Antibody Screen: NEGATIVE

## 2010-06-25 LAB — SURGICAL PCR SCREEN
MRSA, PCR: NEGATIVE
Staphylococcus aureus: NEGATIVE

## 2010-06-25 LAB — ABO/RH: ABO/RH(D): O POS

## 2010-06-29 ENCOUNTER — Ambulatory Visit (HOSPITAL_COMMUNITY)
Admission: RE | Admit: 2010-06-29 | Discharge: 2010-06-30 | Disposition: A | Discharge status: Home / Self Care | Payer: Medicare Other | Source: Ambulatory Visit | Attending: Neurosurgery | Admitting: Neurosurgery

## 2010-06-29 ENCOUNTER — Inpatient Hospital Stay (HOSPITAL_COMMUNITY): Payer: Medicare Other

## 2010-06-29 DIAGNOSIS — Z01818 Encounter for other preprocedural examination: Secondary | ICD-10-CM | POA: Insufficient documentation

## 2010-06-29 DIAGNOSIS — Z01812 Encounter for preprocedural laboratory examination: Secondary | ICD-10-CM | POA: Insufficient documentation

## 2010-06-29 DIAGNOSIS — I10 Essential (primary) hypertension: Secondary | ICD-10-CM | POA: Insufficient documentation

## 2010-06-29 DIAGNOSIS — M5126 Other intervertebral disc displacement, lumbar region: Principal | ICD-10-CM | POA: Insufficient documentation

## 2010-07-13 ENCOUNTER — Telehealth (INDEPENDENT_AMBULATORY_CARE_PROVIDER_SITE_OTHER): Payer: Self-pay | Admitting: *Deleted

## 2010-07-23 NOTE — Progress Notes (Signed)
Summary: refill  Phone Note Refill Request Message from:  Fax from Pharmacy on July 13, 2010 12:01 PM  Refills Requested: Medication #1:  ZOLPIDEM TARTRATE 10 MG TABS 1 by mouth q third night as needed only [facility] [address] - fax [phone]  Initial call taken by: Arbie Coo[place],  July 13, 2010 12:03 PM    Prescriptions: ZOLPIDEM TARTRATE 10 MG TABS (ZOLPIDEM TARTRATE) 1 by mouth q third night as needed only  #10 x 0   Entered by:   Georgette Dover CMA   Authorized by:   Unice Cobble MD   Signed by:   Georgette Dover CMA on 07/13/2010   Method used:   Printed then faxed to ...       [facility] [phone]* (retail)       [place], [place]  [id number]       Ph: [phone] or [phone]       Fax: [phone]   RxID:   [id number]

## 2010-07-23 NOTE — Op Note (Signed)
NAME:  Tina Molina, Tina Molina          ACCOUNT NO.:  [id number]  MEDICAL RECORD NO.:  [id number]           PATIENT TYPE:  I  LOCATION:  [id number]                         FACILITY:  [facility]  PHYSICIAN:  Lovetta Condie A. Desi Carby, M.D.    DATE OF BIRTH:  1945-08-17  DATE OF PROCEDURE:  06/29/2010 DATE OF DISCHARGE:  06/25/2010                              OPERATIVE REPORT   PREOPERATIVE DIAGNOSIS:  Right L3-L4 stenosis and right L4-L5 stenosis/herniated nucleus pulposus.  POSTOPERATIVE DIAGNOSIS:  Right L3-L4 stenosis and right L4-L5 stenosis/herniated nucleus pulposus.  PROCEDURE NOTE:  Right L3-L4 decompressive laminotomy with right L3 and L4 decompressive foraminotomies.  Right L4-L5 decompressive laminotomy with right L4 and L5 foraminotomies.  Right L4-L5 microdiskectomy.  SURGEON:  Cooper Render. Deshon Hsiao, MD  ASSISTANT:  Kary Kos, MD  ANESTHESIA:  General endotracheal.  INDICATION:  Tina Molina is a 65 year old female with history of back and right lower extremity pain, persistent weakness, failing conservative management.  Workup demonstrates evidence of transitional anatomy to her lumbosacral junction.  Her first opened disk space is referred to as L4-L5 where there is a broad-based disk herniation biased towards the right causing marked thecal sac and right L5 nerve root compression.  There is significant facet arthropathy and no spondylolisthesis at this level.  The patient has coexistent stenosis at the L3-L4 level, worse off to the right.  The patient was then counseled as to her options.  She decided to proceed with an L3-L4 and L4-L5 decompressive laminotomy and foraminotomies and right-sided L4-L5 microdiskectomy.  OPERATION NOTE:  The patient was brought to the operating room, placed on the operating table in supine position.  After adequate level of anesthesia was achieved, the patient was placed prone onto Wilson frame, appropriately padded the patient's lumbar regions, prepped and  draped sterilely.  A #10 blade was used to make a curvilinear skin incision overlying the L3, L4, L5 levels.  This was carried down sharply in the midline.  Subperiosteal dissection was performed, exposing the lamina and facet joints of L3, L4, and L5 as well as the facet joints on the aforementioned levels.  Deep self-retaining retractor was placed, intraoperative x-ray was taken care, level was confirmed.  Laminotomy was then performed using high-speed drill and Kerrison rongeurs to remove the inferior aspect of the lamina of L3, medial aspect of the L3- L4 facet joint, superior rim of the L4 lamina, inferior aspect of the L4 lamina, medial aspect of the L4-L5 facet joint, and the superior aspect of the L5 lamina.  Ligamentum flavum was then elevated and resected in a piecemeal fashion at both levels.  Wide decompressive foraminotomies were then performed along the course of the exiting L3, L4, and L5 nerve roots on the right side.  Epidural venous plexus was coagulated and cut. Microscope was then brought into the field and used for microdissection of the right-sided L4-L5 disk space.  Epidural venous plexus was coagulated and cut.  Thecal sac and L5 nerve root were gently mobilized and retracted towards midline.  Disk herniation was readily apparent which was incised with 15 blade in rectangular fashion.  Wide disk space clean-out was achieved using pituitary  rongeurs, upbiting pituitary rongeurs, and Epstein curettes.  All elements of disk herniation were completely resected.  All loose or obvious degenerative disk material was then removed from the interspace.  At this point, a very thorough diskectomy was achieved.  There was no injury to thecal sac or nerve roots.  Wound was then irrigated with antibiotic solution.  Gelfoam was placed topically for hemostasis which was found to be good.  Microscope and retractor system were removed.  Hemostasis of the muscle was achieved with  electrocautery.  Wound was then closed in layers with Vicryl suture.  Steri-Strips and sterile dressing were applied.  There were no complications.  The patient tolerated the procedure well and she returned to the recovery room postoperatively.          ______________________________ Cooper Render Eain Mullendore, M.D.     HAP/MEDQ  D:  06/29/2010  T:  06/30/2010  Job:  [id number]  Electronically Signed by Earnie Larsson M.D. on 07/22/2010 11:21:19 PM

## 2010-08-07 ENCOUNTER — Other Ambulatory Visit: Payer: Self-pay | Admitting: Internal Medicine

## 2010-08-07 NOTE — Telephone Encounter (Signed)
RX 6 months

## 2010-08-14 ENCOUNTER — Other Ambulatory Visit (INDEPENDENT_AMBULATORY_CARE_PROVIDER_SITE_OTHER): Payer: Medicare Other

## 2010-08-14 DIAGNOSIS — E039 Hypothyroidism, unspecified: Secondary | ICD-10-CM

## 2010-08-14 LAB — TSH: TSH: 0.02 u[IU]/mL — ABNORMAL LOW (ref 0.35–5.50)

## 2010-08-15 ENCOUNTER — Encounter: Payer: Self-pay | Admitting: Internal Medicine

## 2010-08-18 ENCOUNTER — Ambulatory Visit (INDEPENDENT_AMBULATORY_CARE_PROVIDER_SITE_OTHER): Payer: Medicare Other | Admitting: Internal Medicine

## 2010-08-18 ENCOUNTER — Encounter: Payer: Self-pay | Admitting: Internal Medicine

## 2010-08-18 VITALS — BP 152/88 | HR 84 | Temp 98.7°F | Wt 209.2 lb

## 2010-08-18 DIAGNOSIS — E039 Hypothyroidism, unspecified: Secondary | ICD-10-CM

## 2010-08-18 MED ORDER — LEVOTHYROXINE SODIUM 150 MCG PO TABS: 150.0000 ug | ORAL_TABLET | ORAL | Status: DC

## 2010-08-18 NOTE — Progress Notes (Signed)
  Subjective:    Patient ID: Tina Molina, female    DOB: [birth year], 65 y.o.   MRN: [id number]  HPI She is here to followup on her thyroid function tests. Her most recent TSH was 0.02; it has been as high as 23.86 in the last 12 months. Her most recent dose of thyroid has been in the 150 mcg daily except for  1&1/2 on  Tuesday and Saturday.  She has a past medical history of thyroidectomy for goiter in the early 1990s.  She has some fatigue; dryness of the skin;hair loss; and intermittent tingling in her thighs. She denies constipation, diarrhea, palpitations,or  nail   change.    Review of Systems     Objective:   Physical Exam she appears healthy and well-nourished.  There is full EOM; she has no lid lag  She has an S4 without significant murmurs or gallops  No tremor is noted. Reflexes are normal.  There is no onycholysis of the nailbeds.  Skin is warm and dry. She has faint erythema over the maxillary areas.        Assessment & Plan:  #1 hypothyroidism; overcorrection based on the present TSH  Plan: #1 the thyroid will be decreased to 150 mg micrograms every day except one& 1/2 half on Wednesday. Because of the marked variation in her TSHs over the past 12 months, I have  recommended that the TSH be repeated in 4 weeks (244.9)

## 2010-08-18 NOTE — Patient Instructions (Addendum)
Please decrease the thyroid to 1 pill everyday except one & 1/2  half on Wednesday. Because your TSH has varied so dramatically, we'll recheck her TSH in 4 weeks (244.9).

## 2010-08-19 ENCOUNTER — Other Ambulatory Visit: Payer: Self-pay

## 2010-08-19 MED ORDER — ZOLPIDEM TARTRATE 10 MG PO TABS: 10.0000 mg | ORAL_TABLET | Freq: Every evening | ORAL | Status: DC | PRN

## 2010-09-15 ENCOUNTER — Other Ambulatory Visit (INDEPENDENT_AMBULATORY_CARE_PROVIDER_SITE_OTHER): Payer: Medicare Other

## 2010-09-15 DIAGNOSIS — E039 Hypothyroidism, unspecified: Secondary | ICD-10-CM

## 2010-09-15 LAB — TSH: TSH: 0.01 u[IU]/mL — ABNORMAL LOW (ref 0.35–5.50)

## 2010-09-22 ENCOUNTER — Ambulatory Visit (INDEPENDENT_AMBULATORY_CARE_PROVIDER_SITE_OTHER): Payer: Medicare Other | Admitting: Internal Medicine

## 2010-09-22 ENCOUNTER — Encounter: Payer: Self-pay | Admitting: Internal Medicine

## 2010-09-22 VITALS — BP 128/82 | HR 84 | Wt 206.4 lb

## 2010-09-22 DIAGNOSIS — E039 Hypothyroidism, unspecified: Secondary | ICD-10-CM

## 2010-09-22 MED ORDER — METOPROLOL SUCCINATE ER 100 MG PO TB24: 100.0000 mg | ORAL_TABLET | Freq: Two times a day (BID) | ORAL | Status: DC

## 2010-09-22 MED ORDER — LEVOTHYROXINE SODIUM 150 MCG PO TABS: 150.0000 ug | ORAL_TABLET | ORAL | Status: DC

## 2010-09-22 NOTE — Patient Instructions (Signed)
Decrease the thyroid supplement to one pill every day except one half on Wednesday. Repeat the TSH in 10 weeks. (Code: 244.9) .

## 2010-09-22 NOTE — Assessment & Plan Note (Signed)
S/P thyroidectomy for goiter in 20s

## 2010-09-22 NOTE — Progress Notes (Signed)
  Subjective:    Patient ID: Tina Molina, female    DOB: 11/26/1945, 65 y.o.   MRN: [id number]  HPI Change in dose, brand or mode of administration: no.  Constitutional: weight change : no; significant fatigue:yes ; sleep pattern: "terrible"; anorexia:no  .   Eyes: vision change(blurred/diplopia/loss): no . ENT/Mouth: hoarseness: no; swallowing issues: no .   CV: palpitations: no; racing: no ; irregularity:no.  GI: constipation: yes; diarrhea: no. Derm:change in nails/ skin/hair: no. Neuro: numbness /tingling:no; tremor:no. Psych: anxiety: no; depression: no ; panic attacks: no.  Endo: temperature intolerance:heat: yes ; cold : no.  TSH is 0.01 on Levothyroxine 150 mcg 1 daily except 1& 1/2 on Weds.    Review of Systems     Objective:   Physical Exam she exhibits no distress. Extraocular motions intact; there is no lid lag. She has an S4 with slight slurring but no significant murmurs. Deep tendon reflexes are 1+ and equal except for absence  @ the left knee. She has no tremor or onycholysis. Skin is warm and dry. The scar tissue is present over  the thyroid bed.        Assessment & Plan:  #1 hypothyroidism; the extremely low TSH suggest overcorrection. This is despite the fact that her thyroid dose was actually decreased  Plan: Levothyroxin 150 mcg 1 daily except one half pill on Wednesday. The TSH should be repeated in 10 weeks.

## 2010-10-02 NOTE — Op Note (Signed)
[facility]  Patient:    Tina Molina, Tina Molina Visit Number: [id number] MRN: [id number]          Service Type: SUR Location: [id number] Attending Physician:  Harl Bowie Dictated by:   Coralie Keens, M.D. Proc. Date: 05/12/01 Admit Date:  05/12/2001 Discharge Date: 05/13/2001   CC:         Camille L. Jonni Sanger, M.D.   Operative Report  PREOPERATIVE DIAGNOSIS:  Thyroid goiter.  POSTOPERATIVE DIAGNOSIS:  Thyroid goiter.  OPERATION:  Total thyroidectomy.  SURGEON:  Coralie Keens, M.D.  ASSISTANT:  Sammuel Hines. Daiva Nakayama, M.D.  ANESTHESIA:  General endotracheal anesthesia.  ESTIMATED BLOOD LOSS:  Minimal  INDICATIONS:  The patient is a 65 year old female who has had a progressively enlarging goiter that has been causing some compressive symptoms.  Secondary to failure of medical management decision has been made to proceed with thyroidectomy.  DESCRIPTION OF PROCEDURE:  The patient was brought to the operating room and identified.  She was placed supine on the operating room table and general anesthesia was induced.  Her neck was then prepped and draped in the usual sterile fashion.  Using #10 blade a transverse collar incision was made across the lower neck.  Incision was carried down through the platysma with the electrocautery.  Superior and inferior skin flaps were then created with the electrocautery.  The medium raphe was then identified and opened with the electrocautery.  The strap muscles were then retracted laterally.  Dissection was then carried down first on the left side of the thyroid.  The patient was found to have an enlarged isthmus as well as a large left lobe with superior pole extending quite severely proximally and around posteriorly behind the trachea.  Dissection was carried out staying close to the capsule of the gland.  The middle vein was identified and clipped proximally and distally and transected with  Metzenbaum.  Attachments to the inferior and superior pole were taken down with the electrocautery as well as surgical clips.  The blood supply to the superior pole was identified and controlled with a silk tie and clips as well.  The inferior thyroid artery was easily identified as well as the recurrent laryngeal nerve.  The superior and inferior parathyroid glands were also identified during dissection of the left gland.  The gland was finally elevated up and dissection was continued along the capsule.  It was finally moved up out of the neck and more medially off the trachea.  The neck was then irrigated with saline.  A piece of Surgicel was then placed in the left neck.  The isthmus was then dissected free with electrocautery as well as surgical clips. Next, our attention was turned to the right side.  This part of the gland was also found to be enlarged as well as difficult to dissect given the superior pole extending far proximally and again posterior around the trachea.  Dissection was carried out carefully in order to avoid any nerve structures.  The vessels to the superior pole were finally identified and tied with silk ties and clipped proximally as well before being transected.  This part of the gland appeared to be quite calcified and serially attached to the overlying muscles and underlying structures.  Again dissection was carried out on the medial aspect and the medial vein was identified and clipped.  The recurrent laryngeal nerve again was identified as well as the inferior parathyroid gland on the right side.  The  rest of the gland was then dissected free from the trachea.  Dissection was then carried medially along the buried ligaments.  These were excised with the electrocautery.  The entire gland was then completely removed in full.  The gland was then sent to pathology for identification.  The right side of the neck was then irrigated with normal saline.  Again  hemostasis was achieved.  A piece of Surgicel was also placed on this side of the neck. Again both sides were examined and hemostasis was achieved.  The medium raphe was then closed with a running 3-0 Vicryl suture. The platysma was then closed with interrupted 3-0 Vicryl sutures and the skin was closed with running 4-0 Monocryl.  Steri-strips, gauze and tape were then applied.  The patient tolerated the procedure well.  All sponge, needle and instrument counts were correct at the end of the procedure.  The patient was then extubated in the operating room and taken in stable condition to the recovery room. Dictated by:   Coralie Keens, M.D. Attending Physician:  Harl Bowie DD:  05/12/01 TD:  05/13/01 Job: [id number]

## 2010-10-02 NOTE — Op Note (Signed)
[place]. [facility]  Patient:    Tina Molina, Tina Molina                 MRN: [id number] Proc. Date: 09/28/99 Adm. Date:  [id number] Disc. Date: [id number] Attending:  Abran Duke                           Operative Report  PREOPERATIVE DIAGNOSIS:  Right carpal tunnel syndrome.  POSTOPERATIVE DIAGNOSIS:  Right carpal tunnel syndrome.  OPERATION PERFORMED:  Right carpal tunnel release.  SURGEON:  Earnie Larsson, M.D.  ANESTHESIA:  Local lidocaine and IV Bier.  INDICATIONS FOR PROCEDURE:  The patient is a 65 year old female with a history of bilateral pain and paresthesias extending to both hands.  Electrodiagnostic studies were consistent with bilateral carpal tunnel syndrome which is moderately severe.  The patient has failed bracing and steroid injections. The patient has decided to proceed with a right-sided carpal tunnel release for hopeful improvement of her right-sided symptoms.  She is aware of the risks and benefits and wishes to proceed.  DESCRIPTION OF PROCEDURE:  The patient was taken to the operating room and placed on the table in supine position.  After an adequate level of regional anesthesia was achieved.  The patient was prepped and draped sterilely.  A linear skin incision was made in the midpalmar crease just distal to the flexor crease in the wrist.  This was carried down sharply to the palmar fascia.  The palmar fascia was divided.  Adductor muscle from the thumb was swept out of the way and dissection proceeded along deeply to the flexor retinaculum.  The flexor retinaculum was then divided with the 15 blade.  The underlying median nerve herniated through the defect in the ligament.  A Valora Corporal was then passed along the course of the median nerve and the flexor retinaculum was then sectioned both distally and then later proximally.  A wide decompression of the median nerve was then performed.  There was no evidence of any continued  constriction.  There was no evidence of injury to the nerve or any obvious branches.  The wound was then irrigated with antibiotic solution.  The skin was reapproximated with 4-0 Vicryl suture at the dermis and 5-0 nylon was then placed in an interrupted vertical mattress fashion at the surface.  A sterile dressing was applied.  There were no apparent complications.  The patient tolerated the procedure well and she returned to the recovery room without problem. DD:  09/28/99 TD:  09/29/99 Job: [id number]

## 2010-10-02 NOTE — Op Note (Signed)
[place]. [facility]  Patient:    Tina Molina, Tina Molina                 MRN: [id number] Proc. Date: 10/30/99 Adm. Date:  [id number] Disc. Date: [id number] Attending:  Abran Duke                           Operative Report  PREOPERATIVE DIAGNOSIS:  Left carpal tunnel syndrome.  POSTOPERATIVE DIAGNOSIS:  Left carpal tunnel syndrome.  PROCEDURE PERFORMED:  Left carpal tunnel release.  SURGEON:  Earnie Larsson, M.D.  ANESTHESIA:  Regional Bier.  INDICATIONS:  The patient is a 65 year old female with a history of bilateral carpal tunnel syndrome, status post right-sided carpal tunnel release.  The patient presents now for left-sided carpal tunnel release.  She is aware of the risks and benefits, and wishes to proceed.  DESCRIPTION OF PROCEDURE:  After an adequate level of regional anesthesia was achieved, the patients left forearm and hand was prepped and draped sterilely.  A linear skin incision was then made along the mid palmar line, just distal to the flexor crease and approximately 2.5 cm in length.  This was carried down sharply through the palmar fascia to the level of the flexor retinaculum.  The flexor retinaculum was then divided using a 15 blade, exposing the median nerve.  A Valora Corporal was then placed between the median nerve and the flexor retinaculum.  This was then divided distally.  Fat then herniated through after adequate distal decompression had been performed.  The median nerve was then decompressed proximally by once again protecting it with a Valora Corporal and then passing a 15 blade more proximally along the nerve root.  A good decompression was complete.  A transection in the flexor retinaculum/transverse carpal ligament was done.  There is evidence of new compression.  The wound was then copiously irrigated with antibiotic solution. There is no visible injury to the median nerve.  The wound was then closed with 4-0 Vicryl suture in a  subcuticular fashion and 5-0 nylon interrupted vertical mattress fashion.  There were no apparent complications.  The patient tolerated the procedure well and she returned to the recovery room postoperative in good condition. DD:  10/30/99 TD:  11/03/99 Job: [id number]

## 2010-10-02 NOTE — Op Note (Signed)
[place]. [facility]  Patient:    Tina Molina, Tina Molina Visit Number: [id number] MRN: [id number]          Service Type: SUR Location: [id number] Attending Physician:  Harl Bowie Dictated by:   Coralie Keens, M.D. Proc. Date: 05/12/01 Admit Date:  05/12/2001                             Operative Report  INCOMPLETE  PREOPERATIVE DIAGNOSIS:  Thyroid goiter.  POSTOPERATIVE DIAGNOSIS:  Thyroid goiter. Dictated by:   Coralie Keens, M.D. Attending Physician:  Harl Bowie DD:  05/12/01 TD:  05/13/01 Job: [id number]

## 2010-10-13 ENCOUNTER — Other Ambulatory Visit: Payer: Self-pay | Admitting: Internal Medicine

## 2010-10-13 MED ORDER — GABAPENTIN 300 MG PO CAPS: ORAL_CAPSULE | ORAL | Status: DC

## 2010-10-13 NOTE — Telephone Encounter (Signed)
RX sent to pharmacy  

## 2010-11-13 ENCOUNTER — Other Ambulatory Visit: Payer: Self-pay | Admitting: Internal Medicine

## 2010-11-13 MED ORDER — ZOLPIDEM TARTRATE 10 MG PO TABS: 10.0000 mg | ORAL_TABLET | Freq: Every evening | ORAL | Status: DC | PRN

## 2010-11-13 NOTE — Telephone Encounter (Signed)
Rx faxed

## 2010-11-20 ENCOUNTER — Other Ambulatory Visit: Payer: Self-pay | Admitting: Internal Medicine

## 2010-11-20 MED ORDER — POTASSIUM CHLORIDE 10 MEQ PO CPCR: 10.0000 meq | ORAL_CAPSULE | Freq: Two times a day (BID) | ORAL | Status: DC

## 2010-11-20 NOTE — Telephone Encounter (Signed)
RX sent to pharmacy  

## 2010-11-26 ENCOUNTER — Ambulatory Visit: Payer: Medicare Other | Admitting: Internal Medicine

## 2010-11-26 ENCOUNTER — Other Ambulatory Visit: Payer: Medicare Other

## 2010-11-26 ENCOUNTER — Ambulatory Visit (INDEPENDENT_AMBULATORY_CARE_PROVIDER_SITE_OTHER): Payer: Medicare Other | Admitting: Internal Medicine

## 2010-11-26 ENCOUNTER — Encounter: Payer: Self-pay | Admitting: Internal Medicine

## 2010-11-26 DIAGNOSIS — M545 Low back pain, unspecified: Secondary | ICD-10-CM

## 2010-11-26 DIAGNOSIS — Z780 Asymptomatic menopausal state: Secondary | ICD-10-CM

## 2010-11-26 DIAGNOSIS — R5381 Other malaise: Secondary | ICD-10-CM

## 2010-11-26 DIAGNOSIS — N951 Menopausal and female climacteric states: Secondary | ICD-10-CM

## 2010-11-26 DIAGNOSIS — M48061 Spinal stenosis, lumbar region without neurogenic claudication: Secondary | ICD-10-CM

## 2010-11-26 DIAGNOSIS — I10 Essential (primary) hypertension: Secondary | ICD-10-CM

## 2010-11-26 DIAGNOSIS — R635 Abnormal weight gain: Secondary | ICD-10-CM

## 2010-11-26 DIAGNOSIS — D649 Anemia, unspecified: Secondary | ICD-10-CM

## 2010-11-26 DIAGNOSIS — R5383 Other fatigue: Secondary | ICD-10-CM

## 2010-11-26 NOTE — Patient Instructions (Addendum)
Please complete the stool cards. Continue the hydrocodone as per Dr. Irven Baltimore recommendations.  Note: I have not heard from the patient in reference to followup CBC and differential. I left a message on her mobile this afternoon to call us Wednesday 7/18

## 2010-11-26 NOTE — Progress Notes (Signed)
Subjective:    Patient ID: Tina Molina, female    DOB: 11-22-1945, 65 y.o.   MRN: [id number]  HPI Tina Molina presents with lumbosacral aching pain intermittently; this began 2 weeks ago after she pulled on a grocery cart. Significantly she had neurosurgery by Dr. Trenton Gammon in February of this year.  She also has some discomfort in her hips to her knees when she arises in the mornings. This is not radiation of pain from the lumbosacral area.  She's had numbness in the left thigh well before  the neurosurgery. She has no stool or urinary incontinence or leg weakness.    Review of Systems  She's had a 7 pound weight gain since her last visit. She is on no specific nutritional program. She also has excessive sweating.  She has had fatigue chronically. She has a past medical history of possible iron deficiency anemia. She has had an intermittent discomfort in the right lower quadrant. She believes her last colonoscopy was in 2008;it was negative.  She denies dysuria, hematuria, or pyuria.     Objective:   Physical Exam Gen.: Healthy and well-nourished in appearance. She is in no acute distress but appears mildly uncomfortable. Mouth: Oral mucosa and oropharynx reveal no lesions or exudates. Teeth in good repair. Neck: No deformities, masses, or tenderness noted. Thyroid normal. Lungs: Normal respiratory effort; chest expands symmetrically. Lungs are clear to auscultation without rales, wheezes, or increased work of breathing. Heart: Normal rate and rhythm. Normal S1 and S2. No gallop, click, or rub. S4 with slurring w/o murmur. Abdomen: Bowel sounds normal; abdomen soft and nontender. No masses, organomegaly or hernias noted.                                                                                    Musculoskeletal/extremities: No deformity or scoliosis noted of  the thoracic or lumbar spine. No clubbing, cyanosis, edema, or deformity noted. Neg SLR .Tone & strength  normal.Joints  normal. Nail health  good. She is able to live flat and sit up without help. Gait is normal; she is able to walk on her toes and heels without foot drop. Vascular: Carotid, radial artery, dorsalis pedis and  posterior tibial pulses are full and equal. No bruits present. Neurologic: Alert and oriented x3. Deep tendon reflexes symmetrical ; deep tendon reflexes are decreased at the knees.         Skin: Intact without suspicious lesions or rashes. Lymph: No cervical, axillary lymphadenopathy present. Psych: Mood and affect are normal. Normally interactive                                                                                         Assessment & Plan:  #1 acute low back pain related to strain; clinically no disc  #2 fatigue in the context of past  history of anemia  #3 intermittent right lower quadrant discomfort  #4 weight gain  Plan: She is to continue the hydrocodone from Dr. Trenton Gammon along with muscle axis as needed. Physical therapy will be initiated if the symptoms fail to resolve. See labs and patient recommendations.

## 2010-11-27 LAB — HEPATIC FUNCTION PANEL
ALT: 31 U/L (ref 0–35)
AST: 27 U/L (ref 0–37)
Albumin: 4 g/dL (ref 3.5–5.2)
Alkaline Phosphatase: 97 U/L (ref 39–117)
Bilirubin, Direct: 0.1 mg/dL (ref 0.0–0.3)
Total Bilirubin: 0.5 mg/dL (ref 0.3–1.2)
Total Protein: 7.4 g/dL (ref 6.0–8.3)

## 2010-11-27 LAB — CBC WITH DIFFERENTIAL/PLATELET
Basophils Absolute: 0 10*3/uL (ref 0.0–0.1)
Basophils Relative: 0.6 % (ref 0.0–3.0)
Eosinophils Absolute: 0.2 10*3/uL (ref 0.0–0.7)
Eosinophils Relative: 6.4 % — ABNORMAL HIGH (ref 0.0–5.0)
HCT: 30.8 % — ABNORMAL LOW (ref 36.0–46.0)
Hemoglobin: 10.6 g/dL — ABNORMAL LOW (ref 12.0–15.0)
Lymphocytes Relative: 65.6 % — ABNORMAL HIGH (ref 12.0–46.0)
Lymphs Abs: 1.8 10*3/uL (ref 0.7–4.0)
MCHC: 34.3 g/dL (ref 30.0–36.0)
MCV: 93.6 fl (ref 78.0–100.0)
Monocytes Absolute: 0.6 10*3/uL (ref 0.1–1.0)
Monocytes Relative: 21.5 % — ABNORMAL HIGH (ref 3.0–12.0)
Neutro Abs: 0.2 10*3/uL — ABNORMAL LOW (ref 1.4–7.7)
Neutrophils Relative %: 5.9 % — ABNORMAL LOW (ref 43.0–77.0)
Platelets: 267 10*3/uL (ref 150.0–400.0)
RBC: 3.29 Mil/uL — ABNORMAL LOW (ref 3.87–5.11)
RDW: 13.8 % (ref 11.5–14.6)
WBC: 2.7 10*3/uL — ABNORMAL LOW (ref 4.5–10.5)

## 2010-11-27 LAB — TSH: TSH: 0.02 u[IU]/mL — ABNORMAL LOW (ref 0.35–5.50)

## 2010-11-27 LAB — BASIC METABOLIC PANEL
BUN: 14 mg/dL (ref 6–23)
CO2: 29 mEq/L (ref 19–32)
Calcium: 9 mg/dL (ref 8.4–10.5)
Chloride: 107 mEq/L (ref 96–112)
Creatinine, Ser: 0.9 mg/dL (ref 0.4–1.2)
GFR: 86.42 mL/min (ref >60.00)
Glucose, Bld: 92 mg/dL (ref 70–99)
Potassium: 3.8 mEq/L (ref 3.5–5.1)
Sodium: 144 mEq/L (ref 135–145)

## 2010-12-07 ENCOUNTER — Other Ambulatory Visit: Payer: Self-pay | Admitting: Internal Medicine

## 2010-12-07 DIAGNOSIS — Z1231 Encounter for screening mammogram for malignant neoplasm of breast: Secondary | ICD-10-CM

## 2010-12-08 ENCOUNTER — Other Ambulatory Visit: Payer: Self-pay | Admitting: Internal Medicine

## 2010-12-15 ENCOUNTER — Other Ambulatory Visit: Payer: Medicare Other

## 2010-12-15 DIAGNOSIS — Z1211 Encounter for screening for malignant neoplasm of colon: Secondary | ICD-10-CM

## 2010-12-15 LAB — HEMOCCULT GUIAC POC 1CARD (OFFICE)
Card #2 Fecal Occult Blod, POC: NEGATIVE
Card #3 Fecal Occult Blood, POC: NEGATIVE
Fecal Occult Blood, POC: NEGATIVE

## 2010-12-15 NOTE — Progress Notes (Signed)
Labs only

## 2010-12-18 ENCOUNTER — Other Ambulatory Visit: Payer: Self-pay | Admitting: Internal Medicine

## 2010-12-21 ENCOUNTER — Ambulatory Visit
Admission: RE | Admit: 2010-12-21 | Discharge: 2010-12-21 | Disposition: A | Discharge status: Home / Self Care | Payer: Medicare Other | Source: Ambulatory Visit | Attending: Internal Medicine | Admitting: Internal Medicine

## 2010-12-21 DIAGNOSIS — Z1231 Encounter for screening mammogram for malignant neoplasm of breast: Secondary | ICD-10-CM

## 2010-12-25 ENCOUNTER — Other Ambulatory Visit: Payer: Self-pay | Admitting: Internal Medicine

## 2010-12-25 MED ORDER — ZOLPIDEM TARTRATE 10 MG PO TABS: 10.0000 mg | ORAL_TABLET | Freq: Every evening | ORAL | Status: DC | PRN

## 2010-12-25 MED ORDER — DIAZEPAM 5 MG PO TABS: 5.0000 mg | ORAL_TABLET | Freq: Two times a day (BID) | ORAL | Status: DC

## 2010-12-25 NOTE — Telephone Encounter (Signed)
RX sent to the pharmacy  

## 2010-12-29 ENCOUNTER — Other Ambulatory Visit: Payer: Self-pay | Admitting: Internal Medicine

## 2010-12-29 DIAGNOSIS — R928 Other abnormal and inconclusive findings on diagnostic imaging of breast: Secondary | ICD-10-CM

## 2011-01-01 ENCOUNTER — Ambulatory Visit
Admission: RE | Admit: 2011-01-01 | Discharge: 2011-01-01 | Disposition: A | Discharge status: Home / Self Care | Payer: Medicare Other | Source: Ambulatory Visit | Attending: Internal Medicine | Admitting: Internal Medicine

## 2011-01-01 DIAGNOSIS — R928 Other abnormal and inconclusive findings on diagnostic imaging of breast: Secondary | ICD-10-CM

## 2011-01-28 ENCOUNTER — Ambulatory Visit (INDEPENDENT_AMBULATORY_CARE_PROVIDER_SITE_OTHER): Payer: Medicare Other | Admitting: Internal Medicine

## 2011-01-28 ENCOUNTER — Encounter: Payer: Self-pay | Admitting: Internal Medicine

## 2011-01-28 DIAGNOSIS — L748 Other eccrine sweat disorders: Secondary | ICD-10-CM

## 2011-01-28 DIAGNOSIS — G479 Sleep disorder, unspecified: Secondary | ICD-10-CM

## 2011-01-28 DIAGNOSIS — D72820 Lymphocytosis (symptomatic): Secondary | ICD-10-CM

## 2011-01-28 DIAGNOSIS — L749 Eccrine sweat disorder, unspecified: Secondary | ICD-10-CM

## 2011-01-28 DIAGNOSIS — R079 Chest pain, unspecified: Secondary | ICD-10-CM

## 2011-01-28 MED ORDER — RANITIDINE HCL 150 MG PO TABS: 150.0000 mg | ORAL_TABLET | Freq: Two times a day (BID) | ORAL | Status: DC

## 2011-01-28 MED ORDER — CLONIDINE HCL 0.1 MG PO TABS: 0.1000 mg | ORAL_TABLET | Freq: Two times a day (BID) | ORAL | Status: DC

## 2011-01-28 NOTE — Patient Instructions (Signed)
Avoid  food & drink should  for @ least 2 hours before going to bed.

## 2011-01-28 NOTE — Progress Notes (Signed)
Addended byHendricks Limes on: 01/28/2011 04:38 PM   Modules accepted: Orders

## 2011-01-28 NOTE — Progress Notes (Signed)
  Subjective:    Patient ID: Tina Molina, female    DOB: 27-Feb-1946, 65 y.o.   MRN: [id number]  HPI #1 she describes sweating as a problem for one to 2 years, worse over the past month. She is not taking any medication for this. She is postmenopausal but still has her uterus and ovaries.   Insomnia Onset:"years" Pattern: Difficulty going to sleep:yes Frequent awakening:yes Early awakening: never asleep until 2 or 3 Nightmares:no Abnormal leg movement:no Snoring:no Apnea:no Risk factors/sleep hygiene: Stimulants:green tea; occasional coffee Alcohol intake:occasionally Reading, watching TV, eating @ bedtime:no Daytime naps:no Stress/anxiety: Work/travel factors:no Impact: Daytime hypersomnolence: no Motor vehicle accident/motor dysfunction:no Treatment to date/efficacy: sleeping every 3rd night   CHEST PAIN: Location: intrascapular area  Quality: dull  Duration: minutes  Onset (rest, exertion): when in bed, not exertional Radiation: SS area  Better with: Pepto Bismol; deep breathing   Symptoms Nausea/vomiting: no  Diaphoresis: no  Shortness of breath: no  Pleuritic: no  Cough: no  Edema: no  Orthopnea: no  PND: no  Dizziness: no  Palpitations: no  Indigestion: no   Red Flags Worse with exertion: no  Tearing/radiation to back: no, comes from back       Review of Systems     Objective:   Physical Exam General appearance is one of good health and nourishment w/o distress.  Eyes: No conjunctival inflammation or scleral icterus is present.  Neck: Some decrease in range of motion laterally. Thyroid normal to palpation Oral exam: Dental hygiene is good; lips and gums are healthy appearing.There is no oropharyngeal erythema , exudate, or significant crowding noted.   Heart:  Normal rate and regular rhythm. S1 and S2 normal without gallop, murmur, click, rub .S4  Lungs:Chest clear to auscultation; no wheezes, rhonchi,rales ,or rubs present.No increased  work of breathing.   Abdomen: bowel sounds normal, soft and non-tender without masses, organomegaly or hernias noted.  No guarding or rebound   Vascular: all pulses intact; no bruits  Skin:Warm & dry.  Intact without suspicious lesions or rashes ; no jaundice or tenting  Lymphatic: No lymphadenopathy is noted about the head, neck, axilla, or inguinal areas.              Assessment & Plan:  #1 diaphoresis, most likely related to postmenopausal state  #2 sleep disorder, chronic  #3 chest pain, atypical. Positionality  &  response to Pepto-Bismol  suggest hiatal hernia. EKG is normal with no ischemic change  Plan: Trial of low-dose clonidine. She is on Cymbalta at this time which would address serotonin deficiency  She should not eat within 2 hours of going to bed. Ranitidine will be initiated.  Referral to sleep specialist discussed.

## 2011-01-29 ENCOUNTER — Other Ambulatory Visit: Payer: Self-pay | Admitting: Internal Medicine

## 2011-01-29 LAB — CBC WITH DIFFERENTIAL/PLATELET
Basophils Absolute: 0 10*3/uL (ref 0.0–0.1)
Basophils Relative: 0.9 % (ref 0.0–3.0)
Eosinophils Absolute: 0.2 10*3/uL (ref 0.0–0.7)
Eosinophils Relative: 4.9 % (ref 0.0–5.0)
HCT: 35.9 % — ABNORMAL LOW (ref 36.0–46.0)
Hemoglobin: 11.9 g/dL — ABNORMAL LOW (ref 12.0–15.0)
Lymphocytes Relative: 73.3 % — ABNORMAL HIGH (ref 12.0–46.0)
Lymphs Abs: 2.4 10*3/uL (ref 0.7–4.0)
MCHC: 33.2 g/dL (ref 30.0–36.0)
MCV: 96.1 fl (ref 78.0–100.0)
Monocytes Absolute: 0.2 10*3/uL (ref 0.1–1.0)
Monocytes Relative: 6.3 % (ref 3.0–12.0)
Neutro Abs: 0.5 10*3/uL — ABNORMAL LOW (ref 1.4–7.7)
Neutrophils Relative %: 14.6 % — ABNORMAL LOW (ref 43.0–77.0)
Platelets: 264 10*3/uL (ref 150.0–400.0)
RBC: 3.74 Mil/uL — ABNORMAL LOW (ref 3.87–5.11)
RDW: 13.6 % (ref 11.5–14.6)
WBC: 3.3 10*3/uL — ABNORMAL LOW (ref 4.5–10.5)

## 2011-02-15 ENCOUNTER — Institutional Professional Consult (permissible substitution): Payer: Medicare Other | Admitting: Pulmonary Disease

## 2011-02-15 ENCOUNTER — Other Ambulatory Visit: Payer: Self-pay | Admitting: Internal Medicine

## 2011-02-15 DIAGNOSIS — D72820 Lymphocytosis (symptomatic): Secondary | ICD-10-CM

## 2011-02-15 NOTE — Telephone Encounter (Signed)
Pt called back re-labs results and would like to be referred to hematology. Pt aware Referral put in.  Lymphocyte elevation persists; please consider allowing me to schedule a Hematology referral to optimally assess this pattern. Your white count & anemia have improved. [employer]

## 2011-02-23 ENCOUNTER — Ambulatory Visit: Payer: Medicare Other | Admitting: Hematology & Oncology

## 2011-03-15 ENCOUNTER — Other Ambulatory Visit: Payer: Self-pay | Admitting: Internal Medicine

## 2011-03-15 NOTE — Telephone Encounter (Signed)
244.9 TSH

## 2011-03-19 ENCOUNTER — Other Ambulatory Visit: Payer: Self-pay | Admitting: Hematology & Oncology

## 2011-03-19 ENCOUNTER — Ambulatory Visit (HOSPITAL_BASED_OUTPATIENT_CLINIC_OR_DEPARTMENT_OTHER): Payer: Medicare Other | Admitting: Hematology & Oncology

## 2011-03-19 ENCOUNTER — Other Ambulatory Visit (HOSPITAL_COMMUNITY)
Admission: RE | Admit: 2011-03-19 | Discharge: 2011-03-19 | Disposition: A | Discharge status: Home / Self Care | Payer: Medicare Other | Source: Ambulatory Visit | Attending: Hematology & Oncology | Admitting: Hematology & Oncology

## 2011-03-19 DIAGNOSIS — R5381 Other malaise: Secondary | ICD-10-CM

## 2011-03-19 DIAGNOSIS — D869 Sarcoidosis, unspecified: Secondary | ICD-10-CM

## 2011-03-19 DIAGNOSIS — D72820 Lymphocytosis (symptomatic): Secondary | ICD-10-CM

## 2011-03-19 DIAGNOSIS — D72819 Decreased white blood cell count, unspecified: Secondary | ICD-10-CM

## 2011-03-19 LAB — CBC WITH DIFFERENTIAL (CANCER CENTER ONLY)
BASO#: 0 10*3/uL (ref 0.0–0.2)
BASO%: 0.5 % (ref 0.0–2.0)
EOS%: 7 % (ref 0.0–7.0)
Eosinophils Absolute: 0.3 10*3/uL (ref 0.0–0.5)
HCT: 35 % (ref 34.8–46.6)
HGB: 12.5 g/dL (ref 11.6–15.9)
LYMPH#: 1.9 10*3/uL (ref 0.9–3.3)
LYMPH%: 50.3 % — ABNORMAL HIGH (ref 14.0–48.0)
MCH: 32.5 pg (ref 26.0–34.0)
MCHC: 35.7 g/dL (ref 32.0–36.0)
MCV: 91 fL (ref 81–101)
MONO#: 0.5 10*3/uL (ref 0.1–0.9)
MONO%: 13.7 % — ABNORMAL HIGH (ref 0.0–13.0)
NEUT#: 1.1 10*3/uL — ABNORMAL LOW (ref 1.5–6.5)
NEUT%: 28.5 % — ABNORMAL LOW (ref 39.6–80.0)
Platelets: 300 10*3/uL (ref 145–400)
RBC: 3.85 10*6/uL (ref 3.70–5.32)
RDW: 13 % (ref 11.1–15.7)
WBC: 3.7 10*3/uL — ABNORMAL LOW (ref 3.9–10.0)

## 2011-03-19 LAB — LACTATE DEHYDROGENASE: LDH: 253 U/L — ABNORMAL HIGH (ref 94–250)

## 2011-03-19 LAB — CHCC SATELLITE - SMEAR

## 2011-03-22 ENCOUNTER — Telehealth: Payer: Self-pay | Admitting: Hematology & Oncology

## 2011-03-22 NOTE — Telephone Encounter (Signed)
Left pt message with 2-13 appointment

## 2011-03-24 LAB — FLOW CYTOMETRY - CHCC SATELLITE

## 2011-03-31 ENCOUNTER — Telehealth: Payer: Self-pay | Admitting: Internal Medicine

## 2011-03-31 NOTE — Telephone Encounter (Signed)
It is essential for her to discuss the weakness with the surgeon; a neuromuscular cause must be ruled out first.

## 2011-04-01 NOTE — Telephone Encounter (Signed)
Pt notified and voices understanding. Pt states she will discuss sweating with her GYN.  Pt wants to know if she can stop Cymbalta. "Does not feel anxious or tense." Not sure if she should continue med. If pt needs to continue she would like a cheaper alternative. Please advise.

## 2011-04-01 NOTE — Telephone Encounter (Signed)
The surgeon should reexamine her & prescribe physical therapy or some other intervention based on his findings. He specializes in these types of symptoms

## 2011-04-01 NOTE — Telephone Encounter (Signed)
Left message to call office

## 2011-04-02 NOTE — Telephone Encounter (Signed)
Discuss with patient  

## 2011-04-18 ENCOUNTER — Other Ambulatory Visit: Payer: Self-pay | Admitting: Internal Medicine

## 2011-04-19 ENCOUNTER — Other Ambulatory Visit: Payer: Self-pay | Admitting: Internal Medicine

## 2011-04-19 MED ORDER — GABAPENTIN 300 MG PO CAPS: ORAL_CAPSULE | ORAL | Status: DC

## 2011-04-19 NOTE — Telephone Encounter (Signed)
TSH 244.9 

## 2011-04-19 NOTE — Telephone Encounter (Signed)
rx sent

## 2011-04-23 ENCOUNTER — Other Ambulatory Visit: Payer: Self-pay | Admitting: Internal Medicine

## 2011-04-27 ENCOUNTER — Other Ambulatory Visit: Payer: Self-pay | Admitting: Internal Medicine

## 2011-05-21 ENCOUNTER — Other Ambulatory Visit: Payer: Self-pay | Admitting: Internal Medicine

## 2011-05-21 NOTE — Telephone Encounter (Signed)
TSH 244.9 

## 2011-05-21 NOTE — Telephone Encounter (Signed)
Patient is requesting 90 day refill of synthroid sent to [place]

## 2011-05-27 ENCOUNTER — Ambulatory Visit (INDEPENDENT_AMBULATORY_CARE_PROVIDER_SITE_OTHER): Payer: Medicare Other | Admitting: Internal Medicine

## 2011-05-27 ENCOUNTER — Encounter: Payer: Self-pay | Admitting: Internal Medicine

## 2011-05-27 DIAGNOSIS — R0989 Other specified symptoms and signs involving the circulatory and respiratory systems: Secondary | ICD-10-CM | POA: Diagnosis not present

## 2011-05-27 DIAGNOSIS — I10 Essential (primary) hypertension: Secondary | ICD-10-CM | POA: Diagnosis not present

## 2011-05-27 DIAGNOSIS — Z0000 Encounter for general adult medical examination without abnormal findings: Secondary | ICD-10-CM

## 2011-05-27 DIAGNOSIS — R7989 Other specified abnormal findings of blood chemistry: Secondary | ICD-10-CM | POA: Diagnosis not present

## 2011-05-27 DIAGNOSIS — R0609 Other forms of dyspnea: Secondary | ICD-10-CM

## 2011-05-27 DIAGNOSIS — E039 Hypothyroidism, unspecified: Secondary | ICD-10-CM | POA: Diagnosis not present

## 2011-05-27 NOTE — Assessment & Plan Note (Signed)
Blood pressure monitor is necessary. If uncontrolled, salt restriction should be considered.

## 2011-05-27 NOTE — Assessment & Plan Note (Signed)
EKG reveals no ischemic changes. Stress testing is indicated if any surgery is necessary or if she plans on increasing her exercise program.

## 2011-05-27 NOTE — Assessment & Plan Note (Signed)
TSH was 0.02 on 11/26/10. In absence of prior history of thyroid nodules; TSH goal should be 1-3 approximately.

## 2011-05-27 NOTE — Progress Notes (Signed)
Subjective:    Patient ID: Tina Molina, female    DOB: 10/04/1945, 66 y.o.   MRN: [id number]  HPI Medicare Wellness Visit:  The following psychosocial & medical history were reviewed as required by Medicare.   Social history: caffeine: rare , alcohol:  holidays ,  tobacco use : quit 1999  & exercise :only in therapy.   Home & personal  safety / fall risk: no issues, activities of daily living: no limitations, seatbelt use : yes , and smoke alarm employment : yes .  Power of Attorney/Living Will status : needed  Vision ( as recorded per Nurse) & Hearing  evaluation :  Ophth exam 2 years ago. Wall chart read with lenses & whisper heard @ 6 ft Orientation :oriented X3 , memory & recall : good , spelling or math testing:good ,and mood & affect : normal . Depression / anxiety: denied but her sister state she will worries a lot Travel history : [place] , immunization status : Flu shot will be given today; she will shot discussed. No transfusion history.Preventive health surveillance ( colonoscopies, BMD , etc as per protocol/ [facility]): She believes her colonoscopy is up-to-date. Dental care: > 2 years. Chart reviewed &  Updated. Active issues reviewed & addressed.       Review of Systems CHRONIC HYPERTENSION: Disease Monitoring  Blood pressure range: No blood pressure monitoring lately  Chest pain: yes, this pain radiates from her back. She denies exertional anginal type pain .Marland Kitchen EKG 01/28/11 revealed occasional PAC. No ischemic changes were present.  Dyspnea: yes, walking   Claudication: no   Medication compliance: yes  Medication Side Effects  Lightheadedness: no   Urinary frequency: no   Edema: no      Preventitive Healthcare:   Diet Pattern: no plan  Salt Restriction: no       Objective:   Physical Exam Gen.: Healthy and well-nourished in appearance. Alert, appropriate and cooperative throughout exam. Head: Normocephalic without obvious abnormalities  Eyes: No  corneal or conjunctival inflammation noted. Pupils equal round reactive to light and accommodation. Fundal exam is benign without hemorrhages, exudate, papilledema. Extraocular motion intact. Vision grossly normal with lenses. Ears: External  ear exam reveals no significant lesions or deformities. Canals clear .TMs normal.  Nose: External nasal exam reveals no deformity or inflammation. Nasal mucosa are pink and moist. No lesions or exudates noted.  Mouth: Oral mucosa and oropharynx reveal no lesions or exudates. Teeth in good repair. Neck: No deformities, masses, or tenderness noted. Range of motion normal. Thyroid : Postoperative changes in the anterior neck. Lungs: Normal respiratory effort; chest expands symmetrically. Lungs are clear to auscultation without rales, wheezes, or increased work of breathing. Heart: Normal rate and rhythm. Normal S1 and S2. No gallop, click, or rub. S4 with slurring; no murmur. Abdomen: Bowel sounds normal; abdomen soft and nontender. No masses, organomegaly or hernias noted. Genitalia: Dr Benjie Karvonen.                                                                                   Musculoskeletal/extremities: No deformity or scoliosis noted of  the thoracic or lumbar spine. No clubbing, cyanosis, edema, or  deformity noted. Range of motion  decreased .Tone & strength  normal.Joints normal. Nail health  good. Vascular: Carotid, radial artery, dorsalis pedis and  posterior tibial pulses are full and equal. No bruits present. Neurologic: Alert and oriented x3. Deep tendon reflexes symmetrical but decreased at the knees.        Skin: Intact without suspicious lesions or rashes. Lymph: No cervical, axillary  lymphadenopathy present. Psych: Mood and affect are normal. Normally interactive                                                                                         Assessment & Plan:  #1 Medicare Wellness Exam; criteria met ; data entered #2 Problem List reviewed  ; Assessment/ Recommendations made Plan: see Orders

## 2011-05-27 NOTE — Patient Instructions (Signed)
Preventive Health Care: Exercise  30-45  minutes a day, 3-4 days a week. Walking is especially valuable in preventing Osteoporosis. Eat a low-fat diet with lots of fruits and vegetables, up to 7-9 servings per day. Consume less than 30 grams of sugar per day from foods & drinks with High Fructose Corn Syrup as # 1,2,3 or #4 on label. Health Care Power of [place] Will place you in charge of your health care  decisions. Verify these are  in place. Blood Pressure Goal  Ideally is an AVERAGE < 135/85. This AVERAGE should be calculated from @ least 5-7 BP readings taken @ different times of day on different days of week. You should not respond to isolated BP readings , but rather the AVERAGE for that week

## 2011-05-28 LAB — BASIC METABOLIC PANEL
BUN: 19 mg/dL (ref 6–23)
CO2: 29 mEq/L (ref 19–32)
Calcium: 9.4 mg/dL (ref 8.4–10.5)
Chloride: 103 mEq/L (ref 96–112)
Creatinine, Ser: 1 mg/dL (ref 0.4–1.2)
GFR: 72.36 mL/min (ref >60.00)
Glucose, Bld: 101 mg/dL — ABNORMAL HIGH (ref 70–99)
Potassium: 4.3 mEq/L (ref 3.5–5.1)
Sodium: 142 mEq/L (ref 135–145)

## 2011-05-28 LAB — HEPATIC FUNCTION PANEL
ALT: 18 U/L (ref 0–35)
AST: 23 U/L (ref 0–37)
Albumin: 4 g/dL (ref 3.5–5.2)
Alkaline Phosphatase: 99 U/L (ref 39–117)
Bilirubin, Direct: 0 mg/dL (ref 0.0–0.3)
Total Bilirubin: 0.4 mg/dL (ref 0.3–1.2)
Total Protein: 7.7 g/dL (ref 6.0–8.3)

## 2011-05-28 LAB — LIPID PANEL
Cholesterol: 256 mg/dL — ABNORMAL HIGH (ref 0–200)
HDL: 64.8 mg/dL (ref >39.00)
Total CHOL/HDL Ratio: 4
Triglycerides: 167 mg/dL — ABNORMAL HIGH (ref 0.0–149.0)
VLDL: 33.4 mg/dL (ref 0.0–40.0)

## 2011-05-28 LAB — LDL CHOLESTEROL, DIRECT: Direct LDL: 156.5 mg/dL

## 2011-05-28 LAB — TSH: TSH: 0.22 u[IU]/mL — ABNORMAL LOW (ref 0.35–5.50)

## 2011-06-10 ENCOUNTER — Other Ambulatory Visit: Payer: Self-pay | Admitting: Neurosurgery

## 2011-06-10 DIAGNOSIS — M539 Dorsopathy, unspecified: Secondary | ICD-10-CM | POA: Diagnosis not present

## 2011-06-10 DIAGNOSIS — M545 Low back pain, unspecified: Secondary | ICD-10-CM

## 2011-06-12 ENCOUNTER — Other Ambulatory Visit: Payer: Self-pay | Admitting: Internal Medicine

## 2011-06-16 ENCOUNTER — Ambulatory Visit
Admission: RE | Admit: 2011-06-16 | Discharge: 2011-06-16 | Disposition: A | Discharge status: Home / Self Care | Payer: Medicare Other | Source: Ambulatory Visit | Attending: Neurosurgery | Admitting: Neurosurgery

## 2011-06-16 DIAGNOSIS — M545 Low back pain, unspecified: Secondary | ICD-10-CM

## 2011-06-16 DIAGNOSIS — M5126 Other intervertebral disc displacement, lumbar region: Secondary | ICD-10-CM | POA: Diagnosis not present

## 2011-06-16 DIAGNOSIS — M4310 Spondylolisthesis, site unspecified: Secondary | ICD-10-CM | POA: Diagnosis not present

## 2011-06-16 DIAGNOSIS — M47817 Spondylosis without myelopathy or radiculopathy, lumbosacral region: Secondary | ICD-10-CM | POA: Diagnosis not present

## 2011-06-16 DIAGNOSIS — M62838 Other muscle spasm: Secondary | ICD-10-CM | POA: Diagnosis not present

## 2011-06-16 DIAGNOSIS — M5137 Other intervertebral disc degeneration, lumbosacral region: Secondary | ICD-10-CM | POA: Diagnosis not present

## 2011-06-16 MED ORDER — GADOBENATE DIMEGLUMINE 529 MG/ML IV SOLN
18.0000 mL | Freq: Once | INTRAVENOUS | Status: AC | PRN
  Administered 2011-06-16: 18 mL via INTRAVENOUS

## 2011-06-22 DIAGNOSIS — M48061 Spinal stenosis, lumbar region without neurogenic claudication: Secondary | ICD-10-CM | POA: Diagnosis not present

## 2011-06-23 ENCOUNTER — Other Ambulatory Visit: Payer: Self-pay | Admitting: Internal Medicine

## 2011-06-25 NOTE — Telephone Encounter (Signed)
Dr.Hopper please advise, when refilling this med a warning populates: details to warning printed and placed on the ledge for review

## 2011-06-25 NOTE — Telephone Encounter (Signed)
OK X 3 mos 

## 2011-06-30 ENCOUNTER — Other Ambulatory Visit (HOSPITAL_BASED_OUTPATIENT_CLINIC_OR_DEPARTMENT_OTHER): Payer: Medicare Other | Admitting: Lab

## 2011-06-30 ENCOUNTER — Ambulatory Visit (HOSPITAL_BASED_OUTPATIENT_CLINIC_OR_DEPARTMENT_OTHER): Payer: Medicare Other | Admitting: Hematology & Oncology

## 2011-06-30 VITALS — BP 131/70 | HR 107 | Temp 97.6°F | Ht 64.0 in | Wt 219.0 lb

## 2011-06-30 DIAGNOSIS — D72819 Decreased white blood cell count, unspecified: Secondary | ICD-10-CM | POA: Diagnosis not present

## 2011-06-30 DIAGNOSIS — D869 Sarcoidosis, unspecified: Secondary | ICD-10-CM

## 2011-06-30 LAB — CBC WITH DIFFERENTIAL (CANCER CENTER ONLY)
BASO#: 0 10*3/uL (ref 0.0–0.2)
BASO%: 0.4 % (ref 0.0–2.0)
EOS%: 3.4 % (ref 0.0–7.0)
Eosinophils Absolute: 0.2 10*3/uL (ref 0.0–0.5)
HCT: 32.9 % — ABNORMAL LOW (ref 34.8–46.6)
HGB: 11.5 g/dL — ABNORMAL LOW (ref 11.6–15.9)
LYMPH#: 3 10*3/uL (ref 0.9–3.3)
LYMPH%: 63.2 % — ABNORMAL HIGH (ref 14.0–48.0)
MCH: 32.9 pg (ref 26.0–34.0)
MCHC: 35 g/dL (ref 32.0–36.0)
MCV: 94 fL (ref 81–101)
MONO#: 0.6 10*3/uL (ref 0.1–0.9)
MONO%: 12 % (ref 0.0–13.0)
NEUT#: 1 10*3/uL — ABNORMAL LOW (ref 1.5–6.5)
NEUT%: 21 % — ABNORMAL LOW (ref 39.6–80.0)
Platelets: 269 10*3/uL (ref 145–400)
RBC: 3.5 10*6/uL — ABNORMAL LOW (ref 3.70–5.32)
RDW: 13.4 % (ref 11.1–15.7)
WBC: 4.7 10*3/uL (ref 3.9–10.0)

## 2011-06-30 LAB — CHCC SATELLITE - SMEAR

## 2011-06-30 NOTE — Progress Notes (Signed)
This office note has been dictated.

## 2011-06-30 NOTE — Progress Notes (Signed)
CC:   Darrick Penna. [name], MD,FACP,FCCP Larey Seat, M.D.  DIAGNOSES: 1. Transient leukopenia. 2. Benign lymphocytosis. 3. Sarcoidosis.  CURRENT THERAPY:  Observation.  INTERIM HISTORY:  [name] comes in for followup.  We first saw her back in November.  At that point in time, I felt that everything that we were dealing with blood-wise was benign.  She does have sarcoidosis. This, thankfully, has not been an issue for her right now.  She feels okay.  She is having some discomfort, I think, down the right leg.  She has had lumbar spine surgery in the past.  She says that she may be getting some steroid injections to the spine.  There have been no rashes.  She has had no fever.  There has been no change in bowel or bladder habits.  PHYSICAL EXAMINATION:  General Appearance:  This is a well-developed, well-nourished, black female in no obvious distress.  Vital Signs: 97.8, pulse 107, respiratory rate 20, blood pressure 131/70.  Weight is 219.  Head and Neck Exam:  A normocephalic, atraumatic skull.  There are no ocular or oral lesions.  There are no palpable cervical or supraclavicular lymph nodes.  Lungs:  Clear bilaterally.  Cardiac Exam: Regular rate and rhythm with a normal S1 and S2.  There are no murmurs, rubs, or bruits.  Abdominal Exam:  Soft with good bowel sounds.  There is no palpable abdominal mass.  There is no fluid wave.  There is no palpable hepatosplenomegaly.  Back Exam:  No tenderness over the spine, ribs, or hips.  She has a laminectomy scar in the lumbar spine that is well healed.  Extremities:  No clubbing, cyanosis, or edema.  LABORATORY STUDIES:  White cell count is 4.7, hemoglobin 11.5, hematocrit 32.9, platelet count is 269.  White cell differential shows 21 segs, 63 lymphocytes, 12 monos.  Peripheral smear shows an increase in lymphocytes.  The lymphocytes appear mature.  There may be a couple of large lymphocytes.  I did not see any atypical  appearing lymphocytes. Neutrophils are without hypersegmented polys.  Her red cells are with no nucleated red blood cells.  There is no rouleaux formation.  There is no polychromasia.  The platelets are adequate in number and size.  IMPRESSION:  [name] is a 66 year old, African American female with transient leukopenia.  Her white cell count is normal today.  She does have an increase in lymphocytes.  I have to believe that this is more of a benign type lymphocytosis, which we can see in African Americans.  I think we just need to follow her up in another 3-4 months.  If we do find that her lymphocytes are increasing in percentage, then we can definitely pursue additional studies.    ______________________________ Tina Molina, M.D. PRE/MEDQ  D:  06/30/2011  T:  06/30/2011  Job:  [id number]

## 2011-07-07 DIAGNOSIS — M47817 Spondylosis without myelopathy or radiculopathy, lumbosacral region: Secondary | ICD-10-CM | POA: Diagnosis not present

## 2011-07-07 DIAGNOSIS — M48061 Spinal stenosis, lumbar region without neurogenic claudication: Secondary | ICD-10-CM | POA: Diagnosis not present

## 2011-07-13 DIAGNOSIS — M47817 Spondylosis without myelopathy or radiculopathy, lumbosacral region: Secondary | ICD-10-CM | POA: Diagnosis not present

## 2011-07-24 ENCOUNTER — Other Ambulatory Visit: Payer: Self-pay | Admitting: Internal Medicine

## 2011-07-26 NOTE — Telephone Encounter (Signed)
Prescription sent to pharmacy.

## 2011-08-04 DIAGNOSIS — M48061 Spinal stenosis, lumbar region without neurogenic claudication: Secondary | ICD-10-CM | POA: Diagnosis not present

## 2011-08-05 DIAGNOSIS — G4700 Insomnia, unspecified: Secondary | ICD-10-CM | POA: Diagnosis not present

## 2011-08-05 DIAGNOSIS — G4726 Circadian rhythm sleep disorder, shift work type: Secondary | ICD-10-CM | POA: Diagnosis not present

## 2011-08-05 DIAGNOSIS — D869 Sarcoidosis, unspecified: Secondary | ICD-10-CM | POA: Diagnosis not present

## 2011-08-10 ENCOUNTER — Telehealth: Payer: Self-pay | Admitting: Internal Medicine

## 2011-08-10 NOTE — Telephone Encounter (Signed)
Hopper pt please advise

## 2011-08-10 NOTE — Telephone Encounter (Signed)
Caller: Tina Molina/Patient; PCP: Unice Cobble; CB#: [phone]; ; ; Call regarding Med Questions;  PT calling asking if BP med could be increased or HCTZ added b/c per Neurologist, she is retaining fluid. Pt pressure has been ranging from 150-170's over 80's on a regular basis and has been taking BP med as directed. Pt is asymptomatic at the moment-no way to check pressure today. Pt also has many other Rx requests regarding meds prescribed by other MD's before moving to [place] (Alopecia, Hydrocortisone, etc)-wanting to know if she can get refills. Emergent sxs r/o. Made pt an appt for BP check and med questions for 3/28 at 1400 with Dr. [name]. Gave call back parameters.

## 2011-08-11 ENCOUNTER — Emergency Department (HOSPITAL_COMMUNITY)
Admission: EM | Admit: 2011-08-11 | Discharge: 2011-08-11 | Disposition: A | Discharge status: Home / Self Care | Payer: Medicare Other | Attending: Emergency Medicine | Admitting: Emergency Medicine

## 2011-08-11 ENCOUNTER — Other Ambulatory Visit: Payer: Self-pay

## 2011-08-11 ENCOUNTER — Encounter (HOSPITAL_COMMUNITY): Payer: Self-pay

## 2011-08-11 ENCOUNTER — Telehealth: Payer: Self-pay | Admitting: Internal Medicine

## 2011-08-11 ENCOUNTER — Ambulatory Visit: Payer: Medicare Other | Admitting: Internal Medicine

## 2011-08-11 DIAGNOSIS — R609 Edema, unspecified: Secondary | ICD-10-CM | POA: Insufficient documentation

## 2011-08-11 DIAGNOSIS — I10 Essential (primary) hypertension: Secondary | ICD-10-CM | POA: Insufficient documentation

## 2011-08-11 DIAGNOSIS — E669 Obesity, unspecified: Secondary | ICD-10-CM | POA: Diagnosis not present

## 2011-08-11 DIAGNOSIS — R6889 Other general symptoms and signs: Secondary | ICD-10-CM | POA: Diagnosis not present

## 2011-08-11 DIAGNOSIS — R0602 Shortness of breath: Secondary | ICD-10-CM | POA: Diagnosis not present

## 2011-08-11 DIAGNOSIS — Z79899 Other long term (current) drug therapy: Secondary | ICD-10-CM | POA: Insufficient documentation

## 2011-08-11 HISTORY — DX: Sarcoidosis, unspecified: D86.9

## 2011-08-11 HISTORY — DX: Disorder of thyroid, unspecified: E07.9

## 2011-08-11 HISTORY — DX: Dorsalgia, unspecified: M54.9

## 2011-08-11 HISTORY — DX: Other chronic pain: G89.29

## 2011-08-11 LAB — URINALYSIS, ROUTINE W REFLEX MICROSCOPIC
Bilirubin Urine: NEGATIVE
Glucose, UA: NEGATIVE mg/dL
Hgb urine dipstick: NEGATIVE
Ketones, ur: NEGATIVE mg/dL
Nitrite: NEGATIVE
Protein, ur: NEGATIVE mg/dL
Specific Gravity, Urine: 1.006 (ref 1.005–1.030)
Urobilinogen, UA: 0.2 mg/dL (ref 0.0–1.0)
pH: 7 (ref 5.0–8.0)

## 2011-08-11 LAB — URINE MICROSCOPIC-ADD ON

## 2011-08-11 NOTE — Telephone Encounter (Signed)
Patient is being seen in the ER. Dr.Hopper was verbally informed about this message

## 2011-08-11 NOTE — ED Notes (Signed)
Pt awoke this am and took BP, was 123XX123 systolic. Bp with ems was 246/136 but has gradually come down. BP now is 186/85. Pt did take medication today. Pt asymptomatic except c/o "pressure" in head. Denies pain.

## 2011-08-11 NOTE — ED Provider Notes (Signed)
History    66 year old female presenting for evaluation of hypertension. Patient states that she has no plan PCP tomorrow so she thought she should check her blood pressure which is noted to be markedly elevated. Blood pressures in the [id number] to [id number] systolic over 0000000 to [id number] diastolic. Patient with no acute complaints. Patient is on multiple blood pressure medications she reports compliance with. Denies recent medication changes. He denies chest pain. Mild shortness of breath with exertion but this is her baseline. Chronic lower extremity swelling which has not appreciably changed recently. No urinary complaints. Denies headache. No acute visual changes. No confusion per her family at bedside.   SN: [id number]  Arrival date & time 08/11/11  39   First MD Initiated Contact with Patient 08/11/11 1448      Chief Complaint  Patient presents with  . Hypertension    (Consider location/radiation/quality/duration/timing/severity/associated sxs/prior treatment) HPI  Past Medical History  Diagnosis Date  . Pneumonia 04/2010    OP  . Anemia     Associated with leukopenia and lymphocytosis. This is been evaluated by Dr. Marin Olp . Her platelet count has been normal  . Hypertension   . Thyroid disease   . Chronic back pain   . Sarcoidosis     Past Surgical History  Procedure Date  . Cholecystectomy   . Carpal tunnel release     Bilateral   . Cervical discectomy 06/2010    Dr.Pool  . Thyroidectomy 2003    For goiter  . Colonoscopy 2010    Family History  Problem Relation Age of Onset  . Anemia Father   . Arthritis Father   . Heart failure Father   . Diabetes Mother   . Arthritis Mother   . Glaucoma Mother   . Hypertension Brother   . Anemia Brother   . Kidney disease Brother   . Hypertension Sister   . Kidney disease Sister   . Anemia Sister   . Diabetes Maternal Aunt   . Arthritis Paternal Grandmother     History  Substance Use Topics  . Smoking status: Former Smoker      Quit date: 05/17/1997  . Smokeless tobacco: Not on file  . Alcohol Use: Yes    OB History    Grav Para Term Preterm Abortions TAB SAB Ect Mult Living                  Review of Systems   Review of symptoms negative unless otherwise noted in HPI.   Allergies  Penicillins; Sulfonamide derivatives; and Codeine  Home Medications   Current Outpatient Rx  Name Route Sig Dispense Refill  . ASPIRIN 81 MG PO TABS Oral Take 81 mg by mouth daily.      . BUDESONIDE-FORMOTEROL FUMARATE 160-4.5 MCG/ACT IN AERO Inhalation Inhale 2 puffs into the lungs 2 (two) times daily.      . CHLORTHALIDONE 25 MG PO TABS  TAKE 1 TABLET BY MOUTH ONCE DAILY 30 tablet 2  . CLONIDINE HCL 0.1 MG PO TABS  TAKE 1 TABLET (0.1 MG TOTAL) BY MOUTH 2 (TWO) TIMES DAILY. 60 tablet 5  . B-12 SL Sublingual Place under the tongue daily.    . CYCLOBENZAPRINE HCL 5 MG PO TABS Oral Take 5 mg by mouth as needed.      . CYMBALTA 60 MG PO CPEP  TAKE ONE CAPSULE BY MOUTH EVERY DAY 30 capsule 5  . DIAZEPAM 5 MG PO TABS Oral Take 1 tablet (5 mg total) by  mouth 2 (two) times daily. 60 tablet 1  . DICLOFENAC SODIUM 1 % TD GEL Topical Apply topically. Apply 4 x daily     . DOXEPIN HCL 10 MG PO CAPS Oral Take 10 mg by mouth at bedtime.    Marland Kitchen FLUOCINONIDE 0.05 % EX OINT Topical Apply 1 application topically. Apply to scalp     . GABAPENTIN 300 MG PO CAPS  1 by mouth every 8 hours as needed 90 capsule 1  . HYDROCODONE-ACETAMINOPHEN 5-500 MG PO TABS Oral Take 1 tablet by mouth every 6 (six) hours as needed.      Marland Kitchen KLOR-CON M10 10 MEQ PO TBCR  TAKE 1 TABLET BY MOUTH DAILY. 180 tablet 1  . LEVOTHYROXINE SODIUM 150 MCG PO TABS  1 by mouth daily except 1/2 T,TH (New instructions) 34 tablet 3  . LOSARTAN POTASSIUM 100 MG PO TABS  TAKE 1 TABLET DAILY IN PLACE OF DIOVAN 30 tablet 11  . METOPROLOL SUCCINATE ER 100 MG PO TB24 Oral Take 1 tablet (100 mg total) by mouth 2 (two) times daily. 60 tablet 11  . MINOXIDIL 2 % EX SOLN Topical Apply  topically. Every other day     . MODAFINIL 100 MG PO TABS Oral Take 100 mg by mouth daily.    Marland Kitchen RANITIDINE HCL 150 MG PO TABS  TAKE 1 TABLET (150 MG TOTAL) BY MOUTH 2 (TWO) TIMES DAILY. 60 tablet 5  . TIMOLOL HEMIHYDRATE 0.5 % OP SOLN Both Eyes Place 1 drop into both eyes daily.      Marland Kitchen VITAMIN E 400 UNITS PO CAPS Oral Take 400 Units by mouth daily.        BP 106/76  Pulse 81  Temp(Src) 98.3 F (36.8 C) (Oral)  Resp 22  SpO2 100%  Physical Exam  Nursing note and vitals reviewed. Constitutional: She is oriented to person, place, and time. She appears well-developed and well-nourished. No distress.       Laying in bed. No acute distress. Obese.  HENT:  Head: Normocephalic and atraumatic.  Eyes: Conjunctivae and EOM are normal. Pupils are equal, round, and reactive to light. Right eye exhibits no discharge. Left eye exhibits no discharge.  Neck: Neck supple.  Cardiovascular: Normal rate, regular rhythm and normal heart sounds.  Exam reveals no gallop and no friction rub.   No murmur heard. Pulmonary/Chest: Effort normal and breath sounds normal. No respiratory distress.  Abdominal: Soft. She exhibits no distension. There is no tenderness.  Musculoskeletal: She exhibits edema. She exhibits no tenderness.       Lower extremity symmetric as compared to each other. There is pitting lower extremity edema. No calf tenderness.  Neurological: She is alert and oriented to person, place, and time. No cranial nerve deficit. She exhibits normal muscle tone. Coordination normal.  Skin: Skin is warm and dry. She is not diaphoretic.  Psychiatric: She has a normal mood and affect. Her behavior is normal. Thought content normal.    ED Course  Procedures (including critical care time)  Labs Reviewed  URINALYSIS, ROUTINE W REFLEX MICROSCOPIC - Abnormal; Notable for the following:    Leukocytes, UA SMALL (*)    All other components within normal limits  URINE MICROSCOPIC-ADD ON   No results  found.  EKG:  Rhythm: Normal sinus rhythm Rate: 80 Axis: Normal axis Intervals: Left atrial enlargement ST segments: Nonspecific ST changes. There is T-wave flattening inferiorly and laterally.   1. Hypertension       MDM  65yF  with hypertension. Known diagnosis on multiple medications. Remains hypertensive although improved from earlier today. Pt with no acute complaints and feel that labs of little utility although UA was done which fairly unremarkable. No protein noted. No indication for emergent intervention. Pt states she has appointment with her PCP tomorrow. Feel that med adjustments more appropriate for PCP. Return precautions discussed.       Virgel Manifold, MD 08/11/11 [phone]

## 2011-08-11 NOTE — Telephone Encounter (Signed)
Hopper pt please advise

## 2011-08-11 NOTE — Discharge Instructions (Signed)
Arterial Hypertension Arterial hypertension (high blood pressure) is a condition of elevated pressure in your blood vessels. Hypertension over a long period of time is a risk factor for strokes, heart attacks, and heart failure. It is also the leading cause of kidney (renal) failure.  CAUSES   In Adults -- Over 90% of all hypertension has no known cause. This is called essential or primary hypertension. In the other 10% of people with hypertension, the increase in blood pressure is caused by another disorder. This is called secondary hypertension. Important causes of secondary hypertension are:   Heavy alcohol use.   Obstructive sleep apnea.   Hyperaldosterosim (Conn's syndrome).   Steroid use.   Chronic kidney failure.   Hyperparathyroidism.   Medications.   Renal artery stenosis.   Pheochromocytoma.   Cushing's disease.   Coarctation of the aorta.   Scleroderma renal crisis.   Licorice (in excessive amounts).   Drugs (cocaine, methamphetamine).  Your caregiver can explain any items above that apply to you.  In Children -- Secondary hypertension is more common and should always be considered.   Pregnancy -- Few women of childbearing age have high blood pressure. However, up to 10% of them develop hypertension of pregnancy. Generally, this will not harm the woman. It may be a sign of 3 complications of pregnancy: preeclampsia, HELLP syndrome, and eclampsia. Follow up and control with medication is necessary.  SYMPTOMS   This condition normally does not produce any noticeable symptoms. It is usually found during a routine exam.   Malignant hypertension is a late problem of high blood pressure. It may have the following symptoms:   Headaches.   Blurred vision.   End-organ damage (this means your kidneys, heart, lungs, and other organs are being damaged).   Stressful situations can increase the blood pressure. If a person with normal blood pressure has their blood  pressure go up while being seen by their caregiver, this is often termed "white coat hypertension." Its importance is not known. It may be related with eventually developing hypertension or complications of hypertension.   Hypertension is often confused with mental tension, stress, and anxiety.  DIAGNOSIS  The diagnosis is made by 3 separate blood pressure measurements. They are taken at least 1 week apart from each other. If there is organ damage from hypertension, the diagnosis may be made without repeat measurements. Hypertension is usually identified by having blood pressure readings:  Above 140/90 mmHg measured in both arms, at 3 separate times, over a couple weeks.   Over 130/80 mmHg should be considered a risk factor and may require treatment in patients with diabetes.  Blood pressure readings over 120/80 mmHg are called "pre-hypertension" even in non-diabetic patients. To get a true blood pressure measurement, use the following guidelines. Be aware of the factors that can alter blood pressure readings.  Take measurements at least 1 hour after caffeine.   Take measurements 30 minutes after smoking and without any stress. This is another reason to quit smoking - it raises your blood pressure.   Use a proper cuff size. Ask your caregiver if you are not sure about your cuff size.   Most home blood pressure cuffs are automatic. They will measure systolic and diastolic pressures. The systolic pressure is the pressure reading at the start of sounds. Diastolic pressure is the pressure at which the sounds disappear. If you are elderly, measure pressures in multiple postures. Try sitting, lying or standing.   Sit at rest for a minimum of   5 minutes before taking measurements.   You should not be on any medications like decongestants. These are found in many cold medications.   Record your blood pressure readings and review them with your caregiver.  If you have hypertension:  Your caregiver  may do tests to be sure you do not have secondary hypertension (see "causes" above).   Your caregiver may also look for signs of metabolic syndrome. This is also called Syndrome X or Insulin Resistance Syndrome. You may have this syndrome if you have type 2 diabetes, abdominal obesity, and abnormal blood lipids in addition to hypertension.   Your caregiver will take your medical and family history and perform a physical exam.   Diagnostic tests may include blood tests (for glucose, cholesterol, potassium, and kidney function), a urinalysis, or an EKG. Other tests may also be necessary depending on your condition.  PREVENTION  There are important lifestyle issues that you can adopt to reduce your chance of developing hypertension:  Maintain a normal weight.   Limit the amount of salt (sodium) in your diet.   Exercise often.   Limit alcohol intake.   Get enough potassium in your diet. Discuss specific advice with your caregiver.   Follow a DASH diet (dietary approaches to stop hypertension). This diet is rich in fruits, vegetables, and low-fat dairy products, and avoids certain fats.  PROGNOSIS  Essential hypertension cannot be cured. Lifestyle changes and medical treatment can lower blood pressure and reduce complications. The prognosis of secondary hypertension depends on the underlying cause. Many people whose hypertension is controlled with medicine or lifestyle changes can live a normal, healthy life.  RISKS AND COMPLICATIONS  While high blood pressure alone is not an illness, it often requires treatment due to its short- and long-term effects on many organs. Hypertension increases your risk for:  CVAs or strokes (cerebrovascular accident).   Heart failure due to chronically high blood pressure (hypertensive cardiomyopathy).   Heart attack (myocardial infarction).   Damage to the retina (hypertensive retinopathy).   Kidney failure (hypertensive nephropathy).  Your caregiver can  explain list items above that apply to you. Treatment of hypertension can significantly reduce the risk of complications. TREATMENT   For overweight patients, weight loss and regular exercise are recommended. Physical fitness lowers blood pressure.   Mild hypertension is usually treated with diet and exercise. A diet rich in fruits and vegetables, fat-free dairy products, and foods low in fat and salt (sodium) can help lower blood pressure. Decreasing salt intake decreases blood pressure in a 1/3 of people.   Stop smoking if you are a smoker.  The steps above are highly effective in reducing blood pressure. While these actions are easy to suggest, they are difficult to achieve. Most patients with moderate or severe hypertension end up requiring medications to bring their blood pressure down to a normal level. There are several classes of medications for treatment. Blood pressure pills (antihypertensives) will lower blood pressure by their different actions. Lowering the blood pressure by 10 mmHg may decrease the risk of complications by as much as 25%. The goal of treatment is effective blood pressure control. This will reduce your risk for complications. Your caregiver will help you determine the best treatment for you according to your lifestyle. What is excellent treatment for one person, may not be for you. HOME CARE INSTRUCTIONS   Do not smoke.   Follow the lifestyle changes outlined in the "Prevention" section.   If you are on medications, follow the directions   carefully. Blood pressure medications must be taken as prescribed. Skipping doses reduces their benefit. It also puts you at risk for problems.   Follow up with your caregiver, as directed.   If you are asked to monitor your blood pressure at home, follow the guidelines in the "Diagnosis" section above.  SEEK MEDICAL CARE IF:   You think you are having medication side effects.   You have recurrent headaches or lightheadedness.     You have swelling in your ankles.   You have trouble with your vision.  SEEK IMMEDIATE MEDICAL CARE IF:   You have sudden onset of chest pain or pressure, difficulty breathing, or other symptoms of a heart attack.   You have a severe headache.   You have symptoms of a stroke (such as sudden weakness, difficulty speaking, difficulty walking).  MAKE SURE YOU:   Understand these instructions.   Will watch your condition.   Will get help right away if you are not doing well or get worse.  Document Released: 05/03/2005 Document Revised: 04/22/2011 Document Reviewed: 12/01/2006 [facility] Patient Information 2012 [place].Hypertension Information As your heart beats, it forces blood through your arteries. This force is your blood pressure. If the pressure is too high, it is called hypertension (HTN) or high blood pressure. HTN is dangerous because you may have it and not know it. High blood pressure may mean that your heart has to work harder to pump blood. Your arteries may be narrow or stiff. The extra work puts you at risk for heart disease, stroke, and other problems.  Blood pressure consists of two numbers, a higher number over a lower, 110/72, for example. It is stated as "110 over 72." The ideal is below 120 for the top number (systolic) and under 80 for the bottom (diastolic).  You should pay close attention to your blood pressure if you have certain conditions such as:  Heart failure.   Prior heart attack.   Diabetes   Chronic kidney disease.   Prior stroke.   Multiple risk factors for heart disease.  To see if you have HTN, your blood pressure should be measured while you are seated with your arm held at the level of the heart. It should be measured at least twice. A one-time elevated blood pressure reading (especially in the Emergency Department) does not mean that you need treatment. There may be conditions in which the blood pressure is different between your right  and left arms. It is important to see your caregiver soon for a recheck. Most people have essential hypertension which means that there is not a specific cause. This type of high blood pressure may be lowered by changing lifestyle factors such as:  Stress.   Smoking.   Lack of exercise.   Excessive weight.   Drug/tobacco/alcohol use.   Eating less salt.  Most people do not have symptoms from high blood pressure until it has caused damage to the body. Effective treatment can often prevent, delay or reduce that damage. TREATMENT  Treatment for high blood pressure, when a cause has been identified, is directed at the cause. There are a large number of medications to treat HTN. These fall into several categories, and your caregiver will help you select the medicines that are best for you. Medications may have side effects. You should review side effects with your caregiver. If your blood pressure stays high after you have made lifestyle changes or started on medicines,   Your medication(s) may need to be  changed.   Other problems may need to be addressed.   Be certain you understand your prescriptions, and know how and when to take your medicine.   Be sure to follow up with your caregiver within the time frame advised (usually within two weeks) to have your blood pressure rechecked and to review your medications.   If you are taking more than one medicine to lower your blood pressure, make sure you know how and at what times they should be taken. Taking two medicines at the same time can result in blood pressure that is too low.  Document Released: 07/06/2005 Document Revised: 01/13/2011 Document Reviewed: 07/13/2007 [facility] Patient Information 2012 [place].How to Take Your Blood Pressure  These instructions are only for electronic home blood pressure machines. You will need:   An automatic or semi-automatic blood pressure machine.   Fresh batteries for the blood pressure  machine.  HOW DO I USE THESE TOOLS TO CHECK MY BLOOD PRESSURE?   There are 2 numbers that make up your blood pressure. For example: 120/80.   The first number (120 in our example) is called the "systolic pressure." It is a measure of the pressure in your blood vessels when your heart is pumping blood.   The second number (80 in our example) is called the "diastolic pressure." It is a measure of the pressure in your blood vessels when your heart is resting between beats.   Before you buy a home blood pressure machine, check the size of your arm so you can buy the right size cuff. Here is how to check the size of your arm:   Use a tape measure that shows both inches and centimeters.   Wrap the tape measure around the middle upper part of your arm. You may need someone to help you measure right.   Write down your arm measurement in both inches and centimeters.   To measure your blood pressure right, it is important to have the right size cuff.   If your arm is up to 13 inches (37 to 34 centimeters), get an adult cuff size.   If your arm is 13 to 17 inches (35 to 44 centimeters), get a large adult cuff size.   If your arm is 17 to 20 inches (45 to 52 centimeters), get an adult thigh cuff.   Try to rest or relax for at least 30 minutes before you check your blood pressure.   Do not smoke.   Do not have any drinks with caffeine, such as:   Pop.   Coffee.   Tea.   Check your blood pressure in a quiet room.   Sit down and stretch out your arm on a table. Keep your arm at about the level of your heart. Let your arm relax.  GETTING BLOOD PRESSURE READINGS  Make sure you remove any tight-fighting clothing from your arm. Wrap the cuff around your upper arm. Wrap it just above the bend, and above where you felt the pulse. You should be able to slip a finger between the cuff and your arm. If you cannot slip a finger in the cuff, it is too tight and should be removed and rewrapped.    Some units requires you to manually pump up the arm cuff.   Automatic units inflate the cuff when you press a button.   Cuff deflation is automatic in both models.   After the cuff is inflated, the unit measures your blood pressure and pulse. The  readings are displayed on a monitor. Hold still and breathe normally while the cuff is inflated.   Getting a reading takes less than a minute.   Some models store readings in a memory. Some provide a printout of readings.   Get readings at different times of the day. You should wait at least 5 minutes between readings. Take readings with you to your next doctor's visit.  Document Released: 04/15/2008 Document Revised: 04/22/2011 Document Reviewed: 04/15/2008 [facility] Patient Information 2012 [place].Coronary Artery Disease, Risk Factors Research has shown that the risk of developing coronary artery disease (CAD) and having a heart attack increases with each factor you have. RISK FACTORS YOU CANNOT CHANGE  Your age. Your risk goes up as you get older. Most heart attacks happen to people over the age of 56.   Gender. Men have a greater risk of heart attack than women, and they have attacks earlier in life. However, women are more likely to die from a heart attack.   Heredity. Children of parents with heart disease are more likely to develop it themselves.   Race. African Americans and other ethnic groups have a higher risk, possibly because of high blood pressure, a tendency toward obesity, and diabetes.   Your family. Most people with a strong family history of heart disease have one or more other risk factors.  RISK FACTORS YOU CAN CHANGE  Exposure to tobacco smoke. Even secondhand smoke greatly increases the risk for heart disease.   High blood cholesterol may be lowered with changes in diet, activity, and medicines.   High blood pressure makes the heart work harder. This causes the heart muscles to become thick and, eventually,  weaker. It also increases your risk of stroke, heart attack, and kidney or heart failure.   Physical inactivity is a risk factor for CAD. Regular physical activity helps prevent heart and blood vessel disease. Exercise helps control blood cholesterol, diabetes, obesity, and it may help lower blood pressure in some people.   Excess body fat, especially belly fat, increases the risk of heart disease and stroke even if there are no other risk factors. Excess weight increases the heart's workload and raises blood pressure and blood cholesterol.   Diabetes seriously increases your risk of developing CAD. If you have diabetes, you should work with your caregiver to manage it and control other risk factors.  OTHER RISK FACTORS FOR CAD  How you respond to stress.   Drinking too much alcohol may raise blood pressure, cause heart failure, and lead to stroke.   Total cholesterol greater than 200 milligrams.   HDL (good) cholesterol less than 40 milligrams. HDL helps keep cholesterol from building up in the walls of the arteries.  PREVENTING CAD  Maintain a healthy weight.   Exercise or do physical activity.   Eat a heart-healthy diet low in fat and salt and high in fiber.   Control your blood pressure to keep it below 120 over 80.   Keep your cholesterol at a level that lowers your risk.   Manage diabetes if you have it.   Stop smoking.   Learn how to manage stress.  HEART SMART SUBSTITUTIONS  Instead of whole or 2% milk and cream, use skim milk.   Instead of fried foods, eat baked, steamed, boiled, broiled, or microwaved foods.   Instead of lard, butter, palm and coconut oils, cook with unsaturated vegetable oils, such as corn, olive, canola, safflower, sesame, soybean, sunflower, or peanut.   Instead of fatty  cuts of meat, eat lean cuts of meat or cut off the fatty parts.   Instead of 1 whole egg in recipes, use 2 egg whites.   Instead of sauces, butter, and salt, season  vegetables with herbs and spices.   Instead of regular hard and processed cheeses, eat low-fat, low-sodium cheeses.   Instead of salted potato chips, choose low-fat, unsalted tortilla and potato chips and unsalted pretzels and popcorn.   Instead of sour cream and mayonnaise, use plain low-fat yogurt, low-fat cottage cheese, or low-fat or "light" sour cream.  FOR MORE INFORMATION  National Heart Lung and Blood Institute: http://www.ball-ray.net/ American Heart Association: weddingcandid.com Document Released: 07/24/2003 Document Revised: 04/22/2011 Document Reviewed: 07/19/2007 [facility] Patient Information 2012 [place], [place].  RESOURCE GUIDE  Dental Problems  Patients with Medicaid: [place] Phone:  [phone]                                                  Phone:  [phone]  If unable to pay or uninsured, contact:  [facility] or [facility]. to become qualified for the adult dental clinic.  Chronic Pain Problems Contact [facility]  [phone] Patients need to be referred by their primary care doctor.  Insufficient Money for Medicine Contact [employer]:  call "211" or [place] [phone].  No Primary Care Doctor Call [facility]  [phone] Other agencies that provide inexpensive medical care    [place]  [phone]    [facility]  [place]  [phone]    [facility]  [phone]    [facility]  [place]  [place]  [phone] [place]   [phone] (emergency services [phone])  [facility]  [phone] [facility]  [phone] [place] [phone] [place] [phone] Residential & Outpatient Substance Abuse Program  [phone]  Abuse/Neglect [place] [phone] [place] [phone] (After Hours)  Emergency [place] [phone]  [place] [facility] [place] [phone] [place] [phone]  MRSA Hotline #:   (514) 398-5768    [facility] Dept. [address]      [place]  [place] Phone:  [phone]                                   Phone:  [phone]                 Phone:  [place] Phone:  [place] [phone] (After Hours)

## 2011-08-11 NOTE — ED Notes (Signed)
Patient has provide a urine sample for the lab if needed.

## 2011-08-11 NOTE — Telephone Encounter (Signed)
Pt/Tina Molina calling regarding elevated BP upon awakening at 1300-200/119. Pt c/o slight h/a behind eyes but otherwise asymptomatic. Had pt take BP again at 1320-249/117. Emergent sxs of Hypertension: Diagnosed-BP 249/117-Had pt call 911-send per office. Pt will do so now. Sister there with pt.

## 2011-08-11 NOTE — Telephone Encounter (Signed)
Caller: Tina Molina/Patient; PCP: Unice Cobble; CB#: [id number]; ;Call regarding FU Appt; Pt was tx'd and released at [facility] ED today for HTN. She is calling for a FU appt per ED MD recommendation. Appt sched for 08/12/11 @ 1200 with Dr. [name]. Pt is advised to call back if any problems.

## 2011-08-11 NOTE — ED Notes (Signed)
Pt voiced no complaints at this time. States "the pressure in my head has gone down." Pt ambulated to restroom and was SOB. States "That's normal but not this bad."

## 2011-08-11 NOTE — ED Notes (Signed)
Pt d/c home in nad. Voiced understanding of d/c instructions.

## 2011-08-12 ENCOUNTER — Encounter: Payer: Self-pay | Admitting: Internal Medicine

## 2011-08-12 ENCOUNTER — Ambulatory Visit (INDEPENDENT_AMBULATORY_CARE_PROVIDER_SITE_OTHER): Payer: Medicare Other | Admitting: Internal Medicine

## 2011-08-12 ENCOUNTER — Ambulatory Visit: Payer: Medicare Other | Admitting: Internal Medicine

## 2011-08-12 VITALS — BP 190/100 | HR 75 | Temp 98.2°F | Wt 222.0 lb

## 2011-08-12 DIAGNOSIS — I10 Essential (primary) hypertension: Secondary | ICD-10-CM

## 2011-08-12 DIAGNOSIS — M1990 Unspecified osteoarthritis, unspecified site: Secondary | ICD-10-CM

## 2011-08-12 DIAGNOSIS — R9431 Abnormal electrocardiogram [ECG] [EKG]: Secondary | ICD-10-CM | POA: Diagnosis not present

## 2011-08-12 DIAGNOSIS — E039 Hypothyroidism, unspecified: Secondary | ICD-10-CM | POA: Diagnosis not present

## 2011-08-12 LAB — TSH: TSH: 0.21 u[IU]/mL — ABNORMAL LOW (ref 0.35–5.50)

## 2011-08-12 MED ORDER — CLONIDINE HCL 0.1 MG PO TABS: 0.2000 mg | ORAL_TABLET | Freq: Two times a day (BID) | ORAL | Status: DC

## 2011-08-12 MED ORDER — DICLOFENAC SODIUM 1 % TD GEL: 1.0000 "application " | Freq: Four times a day (QID) | TRANSDERMAL | Status: DC

## 2011-08-12 NOTE — Assessment & Plan Note (Signed)
Followup TSH needed after the decrease in the thyroid dose in January

## 2011-08-12 NOTE — Assessment & Plan Note (Signed)
Blood pressure suboptimally controlled. Clonidine will be increased to 2 pills twice a day.  She has new minor nonspecific T changes in leads 3 and V6. Cardiology assessment will be  requested because of this and her mild exertional dyspnea.

## 2011-08-12 NOTE — Progress Notes (Signed)
  Subjective:    Patient ID: Tina Molina, female    DOB: 1946-01-30, 66 y.o.   MRN: [id number]  HPI She was seen in the  ER 3/2 7/13 for accelerated hypertension. EMS blood pressure was 236/136 on the right and 238/128 left pre transfer..  Urinalysis was essentially negative in  emergency room. EKG revealed flattening of the T wave in leads 3 and V6. There is suggestion of slight inversion in V6. Compared to 01/28/11 these changes are new.   No labs were performed; medications were not changed. She was asked to followup here.  Labs done at the hematologist/oncologist office in January were also reviewed. Creatinine is 1.0 and BUN 19. GFR was 72.36. Her TSH at that time was 0.22   Review of Systems She denies chest pain , palpitations, or claudication.She's had some dyspnea on exertion . She also has some shortness of breath with activity such as putting on shoes.  Her blood pressure last night was 188/97     Objective:   Physical Exam Gen.:  well-nourished in appearance. Alert, appropriate and cooperative throughout exam.  Eyes: No corneal or conjunctival inflammation noted.  Neck: No deformities, masses, or tenderness noted.  Thyroid absent. Lungs: Normal respiratory effort; chest expands symmetrically. Lungs are clear to auscultation without rales, wheezes, or increased work of breathing. Heart: Normal rate and rhythm. Normal S1 ; slightly accentuated  S2. No gallop, click, or rub. S 4 with slurring. Abdomen: Bowel sounds normal; abdomen soft and nontender. No masses, organomegaly or hernias noted.No AAA                               Musculoskeletal/extremities:  No clubbing, cyanosis, or deformity noted. Trace edema. Nail health  good. Vascular: Carotid, radial artery, dorsalis pedis and  posterior tibial pulses are full and equal. No bruits present. Neurologic: Alert and oriented x3. Deep tendon reflexes symmetrical but 0+.          Skin: Intact without suspicious lesions or  rashes. Lymph: No cervical, axillary lymphadenopathy present. Psych: Mood and affect are normal. Normally interactive                                                                                         Assessment & Plan:

## 2011-08-12 NOTE — Patient Instructions (Signed)
Please bring your  blood pressure cuff to office visits to verify that it is reliable.It  can also be checked against the blood pressure device at the pharmacy. Finger or wrist cuffs are not dependable; an arm cuff is  Blood Pressure Goal =< 140/90 ,ideally< 135/85. This AVERAGE should be calculated from @ least 5-7 BP readings taken @ different times of day on different days of week. You should not respond to isolated BP readings , but rather the AVERAGE for that week

## 2011-08-18 ENCOUNTER — Other Ambulatory Visit: Payer: Self-pay | Admitting: *Deleted

## 2011-08-18 NOTE — Telephone Encounter (Signed)
Pt called requesting refills for Lidex 0.05%, hydrocortisone cream 2.5%, symbicort inhaler, & voltaren 1%. A rx was sent in for the voltaren on 3.28.13 w/ 5 refills but pt states she needs 5 tubes at 1 time so she only has to pay $35 at one time not 5.   Prescriptions should be sent into [facility] [email or web address].

## 2011-08-19 ENCOUNTER — Other Ambulatory Visit: Payer: Self-pay | Admitting: Internal Medicine

## 2011-08-20 MED ORDER — BUDESONIDE-FORMOTEROL FUMARATE 160-4.5 MCG/ACT IN AERO: 2.0000 | INHALATION_SPRAY | Freq: Two times a day (BID) | RESPIRATORY_TRACT | Status: DC

## 2011-08-20 NOTE — Telephone Encounter (Signed)
I called the pharmacy to have them cancel out any previous prescriptions for Voltaren and keep only the one sent in today on file.

## 2011-08-20 NOTE — Telephone Encounter (Signed)
I spoke with patient to clarify which pharmacy she is using. I received a request from [facility] [address] as well as this message for [facility]. Patient state ok for med to be sent to [address] but in the future she would like all rx's sent to [facility]. I removed [facility] form pharmacy list

## 2011-08-23 ENCOUNTER — Other Ambulatory Visit: Payer: Self-pay | Admitting: Internal Medicine

## 2011-08-31 ENCOUNTER — Other Ambulatory Visit: Payer: Self-pay | Admitting: Internal Medicine

## 2011-09-13 ENCOUNTER — Ambulatory Visit (INDEPENDENT_AMBULATORY_CARE_PROVIDER_SITE_OTHER): Payer: Medicare Other | Admitting: Cardiology

## 2011-09-13 ENCOUNTER — Encounter: Payer: Self-pay | Admitting: Cardiology

## 2011-09-13 VITALS — BP 176/100 | HR 92 | Ht 64.0 in | Wt 223.0 lb

## 2011-09-13 DIAGNOSIS — I10 Essential (primary) hypertension: Secondary | ICD-10-CM

## 2011-09-13 DIAGNOSIS — R609 Edema, unspecified: Secondary | ICD-10-CM | POA: Insufficient documentation

## 2011-09-13 MED ORDER — CLONIDINE HCL 0.1 MG PO TABS
0.1000 mg | ORAL_TABLET | Freq: Once | ORAL | Status: AC
  Administered 2011-09-13: 0.1 mg via ORAL

## 2011-09-13 MED ORDER — CLONIDINE HCL 0.1 MG PO TABS: 0.1000 mg | ORAL_TABLET | Freq: Once | ORAL | Status: DC

## 2011-09-13 MED ORDER — SPIRONOLACTONE 50 MG PO TABS: 50.0000 mg | ORAL_TABLET | Freq: Every day | ORAL | Status: DC

## 2011-09-13 NOTE — Assessment & Plan Note (Signed)
Her blood pressure is not well controlled. I will check aldosterone levels as well as a 24-hour urine for VMA and metanephrines. I will start by adding spironolactone 50 mg daily. She'll get a basic metabolic profile today and in about 3 days. She's going to continue to need further med titration.

## 2011-09-13 NOTE — Assessment & Plan Note (Signed)
She has had renal insufficiency. I will be watching this very closely with the addition of spironolactone. I will also be checking renal Dopplers.

## 2011-09-13 NOTE — Assessment & Plan Note (Signed)
The patient has significant diffuse edema. She has dyspnea. I note that her TSH was recently low and this has been regulated. She had no proteinuria on urinalysis. I be checking other labs as above and an echocardiogram to further evaluate. We'll start with spironolactone but she will likely need further diuresis. We're going to need to follow her closely for all of this.

## 2011-09-13 NOTE — Progress Notes (Signed)
HPI The patient presents for evaluation of an abnormal EKG.  She has no prior cardiac history though she's had hypertension for years. I did see a renal Doppler that was normal in 2004. She says most recently her blood pressure has been difficult to control. She does report some increasing fatigue. She's had shortness of breath walking 25 yards on level ground. She's had an increased heart rate. She's also noticed weight gain increased swelling. She doesn't ever see a normal blood pressure at home with the lowest being in the 0000000 systolic and typical systolics being in the [id number]. She does have sporadic "indigestion" but no chest pain with activity. She sleeps chronically with her head slightly elevated. She does have increasing dyspnea with exertion such as walking 25 yards on level ground. She's not describing new PND or orthopnea. She's not had any presyncope or syncope. I did review recent ER records recently. She had presented for hypertension which was managed without admission. Labs at that time were unremarkable including a normal urinalysis.  Allergies  Allergen Reactions  . Penicillins     Intolerance or allergy not remembered; her sister stated penicillin caused "boils under the skin"  . Sulfonamide Derivatives     Arthralgias; her sister stated that this caused a rash.  . Codeine     Nausea only with liquid codeine  . Shellfish Allergy Rash    Current Outpatient Prescriptions  Medication Sig Dispense Refill  . aspirin 81 MG tablet Take 81 mg by mouth daily.        . budesonide-formoterol (SYMBICORT) 160-4.5 MCG/ACT inhaler Inhale 2 puffs into the lungs 2 (two) times daily.  1 Inhaler  1  . chlorthalidone (HYGROTON) 25 MG tablet TAKE 1 TABLET BY MOUTH ONCE DAILY  30 tablet  2  . Cholecalciferol (VITAMIN D3) 1000 UNITS CAPS Take 1 capsule by mouth daily.      . cloNIDine (CATAPRES) 0.1 MG tablet Take 2 tablets (0.2 mg total) by mouth 2 (two) times daily.  60 tablet  5  .  Cyanocobalamin (B-12 SL) Place under the tongue daily.      . cyclobenzaprine (FLEXERIL) 5 MG tablet Take 5 mg by mouth 3 (three) times daily as needed. For muscle pain      . doxepin (SINEQUAN) 10 MG capsule Take 30 mg by mouth at bedtime.       . fluocinonide ointment (LIDEX) 0.05 % USE EVERY OTHER DAY ON SCALP, NOT ON FACE  60 g  2  . gabapentin (NEURONTIN) 300 MG capsule take 1 capsule by mouth every 8 hours if needed  90 capsule  1  . HYDROcodone-acetaminophen (NORCO) 5-325 MG per tablet Take 1-2 tablets by mouth every 6 (six) hours as needed. For pain      . hydrocortisone 2.5 % cream APPLY TO AFFECTED AREA ONE TIME PER DAY AROUND MOUTH FOR SCALING AND DRYNESS  28 g  1  . KLOR-CON M10 10 MEQ tablet TAKE 1 TABLET BY MOUTH DAILY.  180 tablet  1  . levothyroxine (SYNTHROID, LEVOTHROID) 150 MCG tablet 1 by mouth daily except 1/2 T,TH (New instructions)  34 tablet  3  . losartan (COZAAR) 100 MG tablet TAKE 1 TABLET DAILY IN PLACE OF DIOVAN  30 tablet  11  . meloxicam (MOBIC) 15 MG tablet Take 15 mg by mouth daily.      . metoprolol (TOPROL-XL) 100 MG 24 hr tablet Take 1 tablet (100 mg total) by mouth 2 (two) times daily.  60 tablet  11  . ranitidine (ZANTAC) 150 MG tablet Take 150 mg by mouth 2 (two) times daily.      . vitamin E 400 UNIT capsule Take 400 Units by mouth daily.       . VOLTAREN 1 % GEL APPLY 4 GRAMS TO PAINFUL KNEES 4 TIMES A DAY  500 g  1  . DISCONTD: modafinil (PROVIGIL) 100 MG tablet Take 100 mg by mouth daily.      Marland Kitchen DISCONTD: potassium chloride (MICRO-K) 10 MEQ CR capsule Take 1 capsule (10 mEq total) by mouth 2 (two) times daily.  180 capsule  1    Past Medical History  Diagnosis Date  . Pneumonia 04/2010    OP  . Anemia     Associated with leukopenia and lymphocytosis. This is been evaluated by Dr. Marin Olp . Her platelet count has been normal  . Hypertension   . Thyroid disease   . Chronic back pain   . Sarcoidosis     Past Surgical History  Procedure Date  .  Cholecystectomy   . Carpal tunnel release     Bilateral   . Cervical discectomy 06/2010    Dr.Pool  . Thyroidectomy 2003    For goiter  . Colonoscopy 2010    Family History  Problem Relation Age of Onset  . Anemia Father   . Arthritis Father   . Heart failure Father   . Diabetes Mother   . Arthritis Mother   . Glaucoma Mother   . Heart disease Mother   . Hypertension Brother   . Anemia Brother   . Kidney disease Brother   . Hypertension Sister   . Kidney disease Sister   . Anemia Sister   . Diabetes Maternal Aunt   . Arthritis Paternal Grandmother     History   Social History  . Marital Status: Legally Separated    Spouse Name: N/A    Number of Children: N/A  . Years of Education: N/A   Occupational History  . Not on file.   Social History Main Topics  . Smoking status: Former Smoker    Quit date: 05/17/1997  . Smokeless tobacco: Not on file  . Alcohol Use: Yes     rarely  . Drug Use: No  . Sexually Active: Not on file   Other Topics Concern  . Not on file   Social History Narrative  . No narrative on file    ROS:  Positive joint pains.  Otherwise as stated in the HPI and negative for all other systems.  PHYSICAL EXAM BP 210/120  Pulse 92  Ht 5\' 4"  (1.626 m)  Wt 223 lb (101.152 kg)  BMI 38.28 kg/m2 GENERAL:  Well appearing HEENT:  Pupils equal round and reactive, fundi not visualized, oral mucosa unremarkable NECK:  Jugular venous distention 10cm at 45 degrees, waveform within normal limits, carotid upstroke brisk and symmetric, no bruits, no thyromegaly LYMPHATICS:  No cervical, inguinal adenopathy LUNGS:  Clear to auscultation bilaterally BACK:  No CVA tenderness CHEST:  Unremarkable HEART:  PMI not displaced or sustained,S1 and S2 within normal limits, no S3, no S4, no clicks, no rubs, no murmurs ABD:  Flat, positive bowel sounds normal in frequency in pitch, no bruits, no rebound, no guarding, no midline pulsatile mass, no hepatomegaly, no  splenomegaly EXT:  2 plus pulses throughout, diffuse edema (anasarca), no cyanosis no clubbing SKIN:  No rashes no nodules NEURO:  Cranial nerves II through XII grossly intact, motor  grossly intact throughout [facility]:  Cognitively intact, oriented to person place and time  EKG:  Sinus rhythm, rate 80, axis within normal limits, intervals within normal limits, nonspecific inferolateral T wave flattening.  09/13/2011  ASSESSMENT AND PLAN

## 2011-09-13 NOTE — Patient Instructions (Signed)
Please have a blood work today.  Please repeat basic metabolic panel in 4 days.  Start Spirolactone 50 mg a day. Continue other medications as listed.  Your physician has requested that you have an echocardiogram. Echocardiography is a painless test that uses sound waves to create images of your heart. It provides your doctor with information about the size and shape of your heart and how well your heart's chambers and valves are working. This procedure takes approximately one hour. There are no restrictions for this procedure.  Follow up in 1 week with PA.

## 2011-09-14 LAB — BASIC METABOLIC PANEL
BUN: 22 mg/dL (ref 6–23)
CO2: 29 mEq/L (ref 19–32)
Calcium: 9.2 mg/dL (ref 8.4–10.5)
Chloride: 106 mEq/L (ref 96–112)
Creat: 1.09 mg/dL (ref 0.50–1.10)
Glucose, Bld: 114 mg/dL — ABNORMAL HIGH (ref 70–99)
Potassium: 3.8 mEq/L (ref 3.5–5.3)
Sodium: 140 mEq/L (ref 135–145)

## 2011-09-17 ENCOUNTER — Other Ambulatory Visit (INDEPENDENT_AMBULATORY_CARE_PROVIDER_SITE_OTHER): Payer: Medicare Other

## 2011-09-17 ENCOUNTER — Other Ambulatory Visit: Payer: Self-pay | Admitting: *Deleted

## 2011-09-17 DIAGNOSIS — I10 Essential (primary) hypertension: Secondary | ICD-10-CM | POA: Diagnosis not present

## 2011-09-17 LAB — BASIC METABOLIC PANEL
BUN: 21 mg/dL (ref 6–23)
CO2: 28 mEq/L (ref 19–32)
Calcium: 9.1 mg/dL (ref 8.4–10.5)
Chloride: 104 mEq/L (ref 96–112)
Creatinine, Ser: 1.3 mg/dL — ABNORMAL HIGH (ref 0.4–1.2)
GFR: 53.75 mL/min — ABNORMAL LOW (ref >60.00)
Glucose, Bld: 120 mg/dL — ABNORMAL HIGH (ref 70–99)
Potassium: 3.9 mEq/L (ref 3.5–5.1)
Sodium: 142 mEq/L (ref 135–145)

## 2011-09-20 LAB — ALDOSTERONE + RENIN ACTIVITY W/ RATIO
ALDO / PRA Ratio: 100 ratio — ABNORMAL HIGH (ref 0.9–28.9)
Aldosterone: 13 ng/dL
PRA LC/MS/MS: 0.13 ng/mL/h — ABNORMAL LOW (ref 0.25–5.82)

## 2011-09-22 ENCOUNTER — Ambulatory Visit (HOSPITAL_BASED_OUTPATIENT_CLINIC_OR_DEPARTMENT_OTHER): Payer: Medicare Other | Admitting: Hematology & Oncology

## 2011-09-22 ENCOUNTER — Other Ambulatory Visit (HOSPITAL_BASED_OUTPATIENT_CLINIC_OR_DEPARTMENT_OTHER): Payer: Medicare Other | Admitting: Lab

## 2011-09-22 VITALS — BP 185/85 | HR 77 | Temp 97.0°F | Ht 64.0 in | Wt 220.0 lb

## 2011-09-22 DIAGNOSIS — D649 Anemia, unspecified: Secondary | ICD-10-CM

## 2011-09-22 DIAGNOSIS — D869 Sarcoidosis, unspecified: Secondary | ICD-10-CM | POA: Diagnosis not present

## 2011-09-22 DIAGNOSIS — I10 Essential (primary) hypertension: Secondary | ICD-10-CM

## 2011-09-22 DIAGNOSIS — D72819 Decreased white blood cell count, unspecified: Secondary | ICD-10-CM

## 2011-09-22 LAB — CBC WITH DIFFERENTIAL (CANCER CENTER ONLY)
BASO#: 0 10*3/uL (ref 0.0–0.2)
BASO%: 0.6 % (ref 0.0–2.0)
EOS%: 7.3 % — ABNORMAL HIGH (ref 0.0–7.0)
Eosinophils Absolute: 0.4 10*3/uL (ref 0.0–0.5)
HCT: 32.1 % — ABNORMAL LOW (ref 34.8–46.6)
HGB: 11.2 g/dL — ABNORMAL LOW (ref 11.6–15.9)
LYMPH#: 2.4 10*3/uL (ref 0.9–3.3)
LYMPH%: 46.1 % (ref 14.0–48.0)
MCH: 33.6 pg (ref 26.0–34.0)
MCHC: 34.9 g/dL (ref 32.0–36.0)
MCV: 96 fL (ref 81–101)
MONO#: 0.7 10*3/uL (ref 0.1–0.9)
MONO%: 13.9 % — ABNORMAL HIGH (ref 0.0–13.0)
NEUT#: 1.7 10*3/uL (ref 1.5–6.5)
NEUT%: 32.1 % — ABNORMAL LOW (ref 39.6–80.0)
Platelets: 263 10*3/uL (ref 145–400)
RBC: 3.33 10*6/uL — ABNORMAL LOW (ref 3.70–5.32)
RDW: 12.6 % (ref 11.1–15.7)
WBC: 5.2 10*3/uL (ref 3.9–10.0)

## 2011-09-22 LAB — CHCC SATELLITE - SMEAR

## 2011-09-22 NOTE — Progress Notes (Signed)
This office note has been dictated.

## 2011-09-23 ENCOUNTER — Ambulatory Visit (INDEPENDENT_AMBULATORY_CARE_PROVIDER_SITE_OTHER): Payer: Medicare Other | Admitting: Physician Assistant

## 2011-09-23 ENCOUNTER — Encounter: Payer: Self-pay | Admitting: Physician Assistant

## 2011-09-23 ENCOUNTER — Other Ambulatory Visit: Payer: Self-pay

## 2011-09-23 ENCOUNTER — Ambulatory Visit (HOSPITAL_COMMUNITY): Payer: Medicare Other | Attending: Cardiovascular Disease

## 2011-09-23 VITALS — BP 214/104 | HR 82 | Ht 64.0 in | Wt 220.0 lb

## 2011-09-23 DIAGNOSIS — R609 Edema, unspecified: Secondary | ICD-10-CM | POA: Diagnosis not present

## 2011-09-23 DIAGNOSIS — I059 Rheumatic mitral valve disease, unspecified: Secondary | ICD-10-CM | POA: Diagnosis not present

## 2011-09-23 DIAGNOSIS — I10 Essential (primary) hypertension: Secondary | ICD-10-CM

## 2011-09-23 DIAGNOSIS — D869 Sarcoidosis, unspecified: Secondary | ICD-10-CM | POA: Insufficient documentation

## 2011-09-23 DIAGNOSIS — R0609 Other forms of dyspnea: Secondary | ICD-10-CM | POA: Insufficient documentation

## 2011-09-23 DIAGNOSIS — R0989 Other specified symptoms and signs involving the circulatory and respiratory systems: Secondary | ICD-10-CM | POA: Insufficient documentation

## 2011-09-23 DIAGNOSIS — R9431 Abnormal electrocardiogram [ECG] [EKG]: Secondary | ICD-10-CM | POA: Diagnosis not present

## 2011-09-23 DIAGNOSIS — E669 Obesity, unspecified: Secondary | ICD-10-CM | POA: Diagnosis not present

## 2011-09-23 DIAGNOSIS — M48061 Spinal stenosis, lumbar region without neurogenic claudication: Secondary | ICD-10-CM | POA: Diagnosis not present

## 2011-09-23 LAB — BASIC METABOLIC PANEL
BUN: 29 mg/dL — ABNORMAL HIGH (ref 6–23)
CO2: 27 mEq/L (ref 19–32)
Calcium: 9.2 mg/dL (ref 8.4–10.5)
Chloride: 104 mEq/L (ref 96–112)
Creatinine, Ser: 1.3 mg/dL — ABNORMAL HIGH (ref 0.4–1.2)
GFR: 55.24 mL/min — ABNORMAL LOW (ref >60.00)
Glucose, Bld: 105 mg/dL — ABNORMAL HIGH (ref 70–99)
Potassium: 4 mEq/L (ref 3.5–5.1)
Sodium: 142 mEq/L (ref 135–145)

## 2011-09-23 MED ORDER — CLONIDINE HCL 0.1 MG PO TABS: 0.3000 mg | ORAL_TABLET | Freq: Two times a day (BID) | ORAL | Status: DC

## 2011-09-23 MED ORDER — CARVEDILOL 25 MG PO TABS: 25.0000 mg | ORAL_TABLET | Freq: Two times a day (BID) | ORAL | Status: DC

## 2011-09-23 NOTE — Progress Notes (Signed)
CC:   Darrick Penna. [name], MD,FACP,FCCP Larey Seat, M.D.  DIAGNOSES: 1. Transient leukopenia. 2. Lymphocytosis, chronic. 3. Sarcoidosis. 4. Normochromic normocytic anemia.  CURRENT THERAPY:  Observation.  INTERVAL HISTORY:  [name] comes in for followup.  Unfortunately, she has been having problems since we last saw her.  She has had hypertension issues.  She apparently had a malignant hypertension a month or so ago.  Her blood pressure was, I think, 220/110.  She now is on 5 different blood pressure medications.  She says that a 24 urine was done.  She does not have results back.  Her kidney function was checked a week ago.  BUN was 21.  Creatinine was 1.3.  This is a little bit of a change for her.  Her total protein was 7.7 with an albumin of 4.0 in January.  She has had no fever issues.  There has been occasional headache.  She has a lot of back issues.  She has had spine surgery a few years ago.  She has had no cough. She has had no mouth sores.  PHYSICAL EXAMINATION:  This is a well-developed, well-nourished black female who is mildly obese.  Vital signs:  97, pulse 77, respiratory rate 20, blood pressure 185/85.  Weight is 220.  Head and neck: Normocephalic, atraumatic skull.  There are no ocular or oral lesions. There is no scleral icterus.  There is no adenopathy in the neck.  She has a thyroidectomy scar that is well-healed.  Lungs:  Clear bilaterally.  Cardiac:  Regular rate and rhythm with a normal S1, S2. She has a 1/6 systolic ejection murmur.  Abdomen:  Soft with good bowel sounds.  There is no palpable abdominal mass.  There is no fluid wave. There is no palpable hepatosplenomegaly.  Back: No tenderness over the spine, ribs, or hips.  She has a lumbar laminectomy scar.  Extremities: 1+ edema in her legs bilaterally.  Skin: No rashes, ecchymosis or petechia.  Neurologic: No focal neurological deficits.  LABORATORY STUDIES:  White cell count is 5.2,  hemoglobin 11.2, hematocrit 32.1, platelet count 263.  MCV is 96.  White cell differential shows 32 segs, 46 lymphocytes, 14 monos, 7 eosinophils.  IMPRESSION:  [name] is a 66 year old black female with sarcoidosis. Her main problem now is this hypertension.  Again, she is being checked for secondary causes of hypertension with her urine studies.  I do not know if the sarcoidosis may be playing a role.  I do not know if there is any kind of vasculitis that she may be developing.  She has become a little more anemic.  I have to suspect that this is from her kidneys and that she may be developing some renal insufficiency.  I want to back in about 6 weeks time now.  I will do anemia studies on her when we see her back so that we can try to pinpoint a source of the anemia and a therapeutic intervention.    ______________________________ Volanda Napoleon, M.D. PRE/MEDQ  D:  09/22/2011  T:  09/23/2011  Job:  [id number]

## 2011-09-23 NOTE — Progress Notes (Signed)
[address] [place], [place]  [address] Phone: [phone] Fax:  [phone]  Date:  09/23/2011   Name:  Tina Molina   DOB:  April 08, 1946   MRN:  [id number]  PCP:  Tina Cobble, MD, MD  Primary Cardiologist:  Dr. Minus Molina  Primary Electrophysiologist:  None    History of Present Illness: Tina Molina is a 65 y.o. female who returns for follow up.  Initial evaluation by Dr. Percival Molina 09/13/11 for an abnormal EKG and hypertension.  He set her up to check aldosterone levels as well as a 24-hour urine for adrenaline secreting tumor and renal arterial Dopplers and echocardiogram.  He placed her on spironolactone 50 mg daily.  Labs reviewed 2 days ago demonstrate possible primary hyperaldosteronism.  PRA level was low at 0.13 and aldosterone/PRA ratio high at 100.0.  Dr. Percival Molina has recommended holding her spironolactone for 2 weeks and repeating a lab draw.  She returns for follow up today.  Denies CP.  Notes chronic DOE.  No significant change.  Seems to mainly be limited by back pain.  No orthopnea, PND.  Notes "swelling all over."  Feels this started after epidural steroid injection several mos ago.  Reports class II symptoms.  No syncope.  No HAs.  Occasional palps.  Weights stable.      Lab Results  Component Value Date   CREATININE 1.3* 09/17/2011   BUN 21 09/17/2011   NA 142 09/17/2011   K 3.9 09/17/2011   CL 104 09/17/2011   CO2 28 09/17/2011     Past Medical History  Diagnosis Date  . Pneumonia 04/2010    OP  . Anemia     Associated with leukopenia and lymphocytosis. This is been evaluated by Dr. Marin Molina . Her platelet count has been normal  . Hypertension   . Thyroid disease   . Chronic back pain   . Sarcoidosis     Current Outpatient Prescriptions  Medication Sig Dispense Refill  . aspirin 81 MG tablet Take 81 mg by mouth daily.        . budesonide-formoterol (SYMBICORT) 160-4.5 MCG/ACT inhaler Inhale 2 puffs into the lungs 2  (two) times daily.  1 Inhaler  1  . chlorthalidone (HYGROTON) 25 MG tablet TAKE 1 TABLET BY MOUTH ONCE DAILY  30 tablet  2  . Cholecalciferol (VITAMIN D3) 1000 UNITS CAPS Take 1 capsule by mouth daily.      . cloNIDine (CATAPRES) 0.1 MG tablet Take 2 tablets (0.2 mg total) by mouth 2 (two) times daily.  60 tablet  5  . Cyanocobalamin (B-12 SL) Place under the tongue daily.      . cyclobenzaprine (FLEXERIL) 5 MG tablet Take 5 mg by mouth 3 (three) times daily as needed. For muscle pain      . doxepin (SINEQUAN) 10 MG capsule Take 30 mg by mouth at bedtime.       . fluocinonide ointment (LIDEX) 0.05 % USE EVERY OTHER DAY ON SCALP, NOT ON FACE  60 g  2  . gabapentin (NEURONTIN) 300 MG capsule       . HYDROcodone-acetaminophen (NORCO) 5-325 MG per tablet Take 1-2 tablets by mouth every 6 (six) hours as needed. For pain      . hydrocortisone 2.5 % cream APPLY TO AFFECTED AREA ONE TIME PER DAY AROUND MOUTH FOR SCALING AND DRYNESS  28 g  1  . KLOR-CON M10 10 MEQ tablet TAKE 1 TABLET BY MOUTH DAILY.  180 tablet  1  . levothyroxine (SYNTHROID, LEVOTHROID) 150 MCG tablet 1 by mouth daily except 1/2 T,TH (New instructions)  34 tablet  3  . losartan (COZAAR) 100 MG tablet TAKE 1 TABLET DAILY IN PLACE OF DIOVAN  30 tablet  11  . meloxicam (MOBIC) 15 MG tablet Take 15 mg by mouth daily.      . metoprolol (TOPROL-XL) 100 MG 24 hr tablet Take 1 tablet (100 mg total) by mouth 2 (two) times daily.  60 tablet  11  . ranitidine (ZANTAC) 150 MG tablet Take 150 mg by mouth 2 (two) times daily.      Marland Kitchen spironolactone (ALDACTONE) 50 MG tablet Take 1 tablet (50 mg total) by mouth daily.  30 tablet  6  . vitamin E 400 UNIT capsule Take 400 Units by mouth daily.       . VOLTAREN 1 % GEL APPLY 4 GRAMS TO PAINFUL KNEES 4 TIMES A DAY  500 g  1  . DISCONTD: modafinil (PROVIGIL) 100 MG tablet Take 100 mg by mouth daily.      Marland Kitchen DISCONTD: potassium chloride (MICRO-K) 10 MEQ CR capsule Take 1 capsule (10 mEq total) by mouth 2  (two) times daily.  180 capsule  1    Allergies: Allergies  Allergen Reactions  . Penicillins     Intolerance or allergy not remembered; her sister stated penicillin caused "boils under the skin"  . Sulfonamide Derivatives     Arthralgias; her sister stated that this caused a rash.  . Codeine     Nausea only with liquid codeine  . Shellfish Allergy Rash    History  Substance Use Topics  . Smoking status: Former Smoker    Quit date: 05/17/1997  . Smokeless tobacco: Not on file  . Alcohol Use: Yes     rarely     ROS:  Please see the history of present illness.     All other systems reviewed and negative.   PHYSICAL EXAM: VS:  BP 214/104  Pulse 82  Ht 5\' 4"  (1.626 m)  Wt 220 lb (99.791 kg)  BMI 37.76 kg/m2 Well nourished, well developed, in no acute distress HEENT: normal Neck: no JVD Cardiac:  normal S1, S2; RRR; no murmur Lungs:  clear to auscultation bilaterally, no wheezing, rhonchi or rales Abd: soft, nontender, no hepatomegaly Ext: no edema Skin: warm and dry Neuro:  CNs 2-12 intact, no focal abnormalities noted  EKG:  NSR, HR 83, normal axis, non-specific ST-T changes  ASSESSMENT AND PLAN:  1.  Hypertension   -  Reviewed case with Dr. Minus Molina today.   -  Stop Spironolactone x 2 weeks then repeat PRA/Aldo ratio.   -  Will get Abdominal CT with and without contrast to look for adrenal tumor.   -  24 hour urine to assess for adrenaline secreting tumors pending.   -  Renal arterial dopplers pending.   -  Echo done today and results pending.   -  D/c Toprol and change to Coreg 25 mg BID.   -  Increase Clonidine to 0.3 mg bid.   -  She admits to high salt diet.  Will give 2 gm Na diet.     -  Follow up in 2-3 weeks.    Signed, Tina Dopp, PA-C  2:10 PM 09/23/2011

## 2011-09-23 NOTE — Patient Instructions (Addendum)
SCHEDULE ABDOMEN CT W/&W/O FOR DX HTN AND TO R/O ADRENAL TUMOR  Your physician recommends that you return for lab work in: [place] physician recommends that you return for lab work in: 2 WEEKS PRA [place]..... THIS NEEDS TO BE DONE AFTER THE PT HAS BEEN OFF HER SPIRONOLACTONE FOR 2 WEEKS STARTING TODAY 09/23/11. IT WILL NEED  TO BE DRAWN 8 AM AFTER THE PT HAS BEEN UP WALKING AROUND FOR 2 HOURS   Your physician recommends that you schedule a follow-up appointment in: 2-3 WEEKS WITH DR. HOCHREIN OR SCOTT WEAVER, PAC WITH DR. HOCHREIN IN THE OFFICE SAME DAY, THIS APPT IS TO BE A FEW DAY AFTER THE LAB WORK HAS BEEN   Your physician has recommended you make the following change in your medication: STARTING TODAY YOU ARE TO HOLD YOUR SPIRONOLACTONE FOR 2 WEEKS; STOP TOPROL; START COREG 25 MG 1 TABLET TWICE DAILY

## 2011-09-24 ENCOUNTER — Telehealth: Payer: Self-pay | Admitting: *Deleted

## 2011-09-24 DIAGNOSIS — I10 Essential (primary) hypertension: Secondary | ICD-10-CM

## 2011-09-24 DIAGNOSIS — N189 Chronic kidney disease, unspecified: Secondary | ICD-10-CM

## 2011-09-24 LAB — CATECHOLAMINES, FRACTIONATED, URINE, 24 HOUR
Calculated Total (E+NE): 25 mcg/24 h — ABNORMAL LOW (ref 26–121)
Creatinine, Urine mg/day-CATEUR: 1.14 g/(24.h) (ref 0.63–2.50)
Dopamine, 24 hr Urine: 89 mcg/24 h (ref 52–480)
Norepinephrine, 24 hr Ur: 25 mcg/24 h (ref 15–100)
Total Volume - CF 24Hr U: 1800 mL

## 2011-09-24 LAB — VMA, URINE, 24 HOUR
Creatinine 24h urine: 1.14 g/(24.h) (ref 0.63–2.50)
Vanillylmandelic Acid, (VMA): 4.7 mg/24 h (ref <=6.0)

## 2011-09-24 NOTE — Telephone Encounter (Signed)
Pt notified of lab results and to have bmet 5/14, CT on 5/16

## 2011-09-24 NOTE — Telephone Encounter (Signed)
Message copied by Michae Kava on Fri Sep 24, 2011 12:44 PM ------      Message from: [place], [place] T      Created: Thu Sep 23, 2011  5:31 PM       Creatinine little high      She is now off spironolactone      Repeat bmet in one week      Do not schedule CT scan until after follow up bmet (need to recheck creatinine)      Richardson Dopp, PA-C  5:31 PM 09/23/2011

## 2011-09-26 LAB — METANEPHRINES, URINE, 24 HOUR
Metaneph Total, Ur: 332 mcg/24 h (ref 224–832)
Metanephrines, Ur: 49 mcg/24 h — ABNORMAL LOW (ref 90–315)
Normetanephrine, 24H Ur: 283 mcg/24 h (ref 122–676)

## 2011-09-27 ENCOUNTER — Other Ambulatory Visit: Payer: Self-pay | Admitting: Neurosurgery

## 2011-09-27 DIAGNOSIS — M48061 Spinal stenosis, lumbar region without neurogenic claudication: Secondary | ICD-10-CM

## 2011-09-28 ENCOUNTER — Other Ambulatory Visit (INDEPENDENT_AMBULATORY_CARE_PROVIDER_SITE_OTHER): Payer: Medicare Other

## 2011-09-28 DIAGNOSIS — N189 Chronic kidney disease, unspecified: Secondary | ICD-10-CM | POA: Diagnosis not present

## 2011-09-28 DIAGNOSIS — I10 Essential (primary) hypertension: Secondary | ICD-10-CM

## 2011-09-28 LAB — BASIC METABOLIC PANEL
BUN: 29 mg/dL — ABNORMAL HIGH (ref 6–23)
CO2: 27 mEq/L (ref 19–32)
Calcium: 8.9 mg/dL (ref 8.4–10.5)
Chloride: 106 mEq/L (ref 96–112)
Creatinine, Ser: 1.4 mg/dL — ABNORMAL HIGH (ref 0.4–1.2)
GFR: 48.86 mL/min — ABNORMAL LOW (ref >60.00)
Glucose, Bld: 123 mg/dL — ABNORMAL HIGH (ref 70–99)
Potassium: 3.9 mEq/L (ref 3.5–5.1)
Sodium: 141 mEq/L (ref 135–145)

## 2011-09-29 ENCOUNTER — Telehealth: Payer: Self-pay | Admitting: *Deleted

## 2011-09-29 NOTE — Telephone Encounter (Signed)
lmom creatinine is stable

## 2011-09-29 NOTE — Telephone Encounter (Signed)
Message copied by Michae Kava on Wed Sep 29, 2011  3:01 PM ------      Message from: [place], [place] T      Created: Tue Sep 28, 2011  9:03 PM       Creatinine stable.      BUT - borderline in terms of proceeding with CT with contrast.      She is supposed to be getting scheduled for abdominal CT with and without contrast to look for adrenal tumor      I'll forward to Dr. Minus Breeding as well.      Before scheduling CT - Check with CT in our office to see if ok to proceed with CT WITHOUT contrast for now given her creatinine (unless they think the contrast load is low enough to be safe)      Thanks      Richardson Dopp, PA-C  9:03 PM 09/28/2011

## 2011-09-30 ENCOUNTER — Ambulatory Visit (INDEPENDENT_AMBULATORY_CARE_PROVIDER_SITE_OTHER)
Admission: RE | Admit: 2011-09-30 | Discharge: 2011-09-30 | Disposition: A | Discharge status: Home / Self Care | Payer: Medicare Other | Source: Ambulatory Visit | Attending: Cardiology | Admitting: Cardiology

## 2011-09-30 DIAGNOSIS — I10 Essential (primary) hypertension: Secondary | ICD-10-CM

## 2011-09-30 MED ORDER — IOHEXOL 300 MG/ML  SOLN
80.0000 mL | Freq: Once | INTRAMUSCULAR | Status: AC | PRN
  Administered 2011-09-30: 80 mL via INTRAVENOUS

## 2011-10-04 ENCOUNTER — Other Ambulatory Visit: Payer: Self-pay | Admitting: *Deleted

## 2011-10-04 DIAGNOSIS — K319 Disease of stomach and duodenum, unspecified: Secondary | ICD-10-CM

## 2011-10-06 ENCOUNTER — Ambulatory Visit
Admission: RE | Admit: 2011-10-06 | Discharge: 2011-10-06 | Disposition: A | Discharge status: Home / Self Care | Payer: Medicare Other | Source: Ambulatory Visit | Attending: Neurosurgery | Admitting: Neurosurgery

## 2011-10-06 DIAGNOSIS — M48061 Spinal stenosis, lumbar region without neurogenic claudication: Secondary | ICD-10-CM

## 2011-10-06 DIAGNOSIS — M47817 Spondylosis without myelopathy or radiculopathy, lumbosacral region: Secondary | ICD-10-CM | POA: Diagnosis not present

## 2011-10-06 DIAGNOSIS — M5137 Other intervertebral disc degeneration, lumbosacral region: Secondary | ICD-10-CM | POA: Diagnosis not present

## 2011-10-06 DIAGNOSIS — M5126 Other intervertebral disc displacement, lumbar region: Secondary | ICD-10-CM | POA: Diagnosis not present

## 2011-10-07 DIAGNOSIS — M48061 Spinal stenosis, lumbar region without neurogenic claudication: Secondary | ICD-10-CM | POA: Diagnosis not present

## 2011-10-08 ENCOUNTER — Other Ambulatory Visit: Payer: Self-pay | Admitting: Physician Assistant

## 2011-10-08 DIAGNOSIS — I10 Essential (primary) hypertension: Secondary | ICD-10-CM | POA: Diagnosis not present

## 2011-10-15 ENCOUNTER — Encounter: Payer: Self-pay | Admitting: Cardiology

## 2011-10-15 ENCOUNTER — Ambulatory Visit (INDEPENDENT_AMBULATORY_CARE_PROVIDER_SITE_OTHER): Payer: Medicare Other | Admitting: Cardiology

## 2011-10-15 ENCOUNTER — Other Ambulatory Visit: Payer: Self-pay

## 2011-10-15 VITALS — BP 160/90 | HR 76 | Ht 64.0 in | Wt 221.8 lb

## 2011-10-15 DIAGNOSIS — R9389 Abnormal findings on diagnostic imaging of other specified body structures: Secondary | ICD-10-CM | POA: Diagnosis not present

## 2011-10-15 DIAGNOSIS — I10 Essential (primary) hypertension: Secondary | ICD-10-CM

## 2011-10-15 DIAGNOSIS — N189 Chronic kidney disease, unspecified: Secondary | ICD-10-CM

## 2011-10-15 MED ORDER — SPIRONOLACTONE 50 MG PO TABS: 50.0000 mg | ORAL_TABLET | Freq: Every day | ORAL | Status: DC

## 2011-10-15 MED ORDER — CLONIDINE HCL 0.1 MG PO TABS: 0.3000 mg | ORAL_TABLET | Freq: Two times a day (BID) | ORAL | Status: DC

## 2011-10-15 NOTE — Patient Instructions (Addendum)
Please start Spironolactone 50 mg once a day Continue all other medications as listed  Please have lab work in 3 days, 1 week and in 1 month.  Follow up with Dr Percival Spanish in 1 month

## 2011-10-15 NOTE — Assessment & Plan Note (Signed)
She is supposed to see a GI doctor for an abnormality on her CT at the [facility].  This has not yet happened I will will try to make sure that this is completed.

## 2011-10-15 NOTE — Telephone Encounter (Signed)
..   Requested Prescriptions   Signed Prescriptions Disp Refills  . cloNIDine (CATAPRES) 0.1 MG tablet 180 tablet 5    Sig: Take 3 tablets (0.3 mg total) by mouth 2 (two) times daily.    Authorizing Provider: Minus Breeding    Ordering User: Ardis Hughs, Ester Mabe Jerilynn Mages

## 2011-10-15 NOTE — Assessment & Plan Note (Signed)
I will watch this very closely.

## 2011-10-15 NOTE — Assessment & Plan Note (Signed)
The patient has now had a very extensive workup for secondary causes seems to have a hyper renin state. I reviewed this with one of our nephrologists.  The plan will be to start spironolactone at 50 mg daily. We also discussed when necessary doses of clonidine if her blood pressure is very elevated. She'll continue to keep a blood pressure diary.  I did review this today.

## 2011-10-15 NOTE — Progress Notes (Signed)
HPI The patient returns for followup of difficult to control hypertension.   Labs demonstrate PRA level was low at 0.13 and aldosterone/PRA ratio high at 100.0.  CT was negative for any evidence of an adenoma. 24-hour urine was unremarkable for metanephrines.  She had a repeat 8 am renin/aldosterone level with results pending.  Since I first saw her we have increased clonidine and switched her from metoprolol to carvedilol. Unfortunately her blood pressure is still quite labile with occasional readings of 200.  The patient denies any new symptoms such as chest discomfort, neck or arm discomfort. There has been no new shortness of breath, PND or orthopnea. There have been no reported palpitations, presyncope or syncope.    Allergies  Allergen Reactions  . Penicillins     Intolerance or allergy not remembered; her sister stated penicillin caused "boils under the skin"  . Sulfonamide Derivatives     Arthralgias; her sister stated that this caused a rash.  . Codeine     Nausea only with liquid codeine  . Shellfish Allergy Rash    Current Outpatient Prescriptions  Medication Sig Dispense Refill  . aspirin 81 MG tablet Take 81 mg by mouth daily.        . budesonide-formoterol (SYMBICORT) 160-4.5 MCG/ACT inhaler Inhale 2 puffs into the lungs 2 (two) times daily.  1 Inhaler  1  . carvedilol (COREG) 25 MG tablet Take 1 tablet (25 mg total) by mouth 2 (two) times daily.  60 tablet  6  . chlorthalidone (HYGROTON) 25 MG tablet TAKE 1 TABLET BY MOUTH ONCE DAILY  30 tablet  2  . Cholecalciferol (VITAMIN D3) 1000 UNITS CAPS Take 1 capsule by mouth daily.      . cloNIDine (CATAPRES) 0.1 MG tablet Take 3 tablets (0.3 mg total) by mouth 2 (two) times daily.  90 tablet  5  . Cyanocobalamin (B-12 SL) Place under the tongue daily.      . cyclobenzaprine (FLEXERIL) 5 MG tablet Take 5 mg by mouth 3 (three) times daily as needed. For muscle pain      . doxepin (SINEQUAN) 10 MG capsule Take 30 mg by mouth at  bedtime.       . fluocinonide ointment (LIDEX) 0.05 % USE EVERY OTHER DAY ON SCALP, NOT ON FACE  60 g  2  . gabapentin (NEURONTIN) 300 MG capsule Take 300 mg by mouth as needed.       Marland Kitchen HYDROcodone-acetaminophen (NORCO) 5-325 MG per tablet Take 1-2 tablets by mouth every 6 (six) hours as needed. For pain      . hydrocortisone 2.5 % cream APPLY TO AFFECTED AREA ONE TIME PER DAY AROUND MOUTH FOR SCALING AND DRYNESS  28 g  1  . KLOR-CON M10 10 MEQ tablet TAKE 1 TABLET BY MOUTH DAILY.  180 tablet  1  . levothyroxine (SYNTHROID, LEVOTHROID) 150 MCG tablet 1 by mouth daily except 1/2 T,TH (New instructions)  34 tablet  3  . losartan (COZAAR) 100 MG tablet TAKE 1 TABLET DAILY IN PLACE OF DIOVAN  30 tablet  11  . meloxicam (MOBIC) 15 MG tablet Take 15 mg by mouth daily.      . ranitidine (ZANTAC) 150 MG tablet Take 150 mg by mouth 2 (two) times daily.      . vitamin E 400 UNIT capsule Take 400 Units by mouth daily.       . VOLTAREN 1 % GEL APPLY 4 GRAMS TO PAINFUL KNEES 4 TIMES A DAY  500 g  1  . DISCONTD: modafinil (PROVIGIL) 100 MG tablet Take 100 mg by mouth daily.      Marland Kitchen DISCONTD: potassium chloride (MICRO-K) 10 MEQ CR capsule Take 1 capsule (10 mEq total) by mouth 2 (two) times daily.  180 capsule  1    Past Medical History  Diagnosis Date  . Pneumonia 04/2010    OP  . Anemia     Associated with leukopenia and lymphocytosis. This is been evaluated by Dr. Marin Olp . Her platelet count has been normal  . Hypertension   . Thyroid disease   . Chronic back pain   . Sarcoidosis     Past Surgical History  Procedure Date  . Cholecystectomy   . Carpal tunnel release     Bilateral   . Cervical discectomy 06/2010    Dr.Pool  . Thyroidectomy 2003    For goiter  . Colonoscopy 2010    ROS:  Positive joint pains.  Otherwise as stated in the HPI and negative for all other systems.  PHYSICAL EXAM BP 160/90  Pulse 76  Ht 5\' 4"  (1.626 m)  Wt 221 lb 12.8 oz (100.608 kg)  BMI 38.07  kg/m2 GENERAL:  Well appearing HEENT:  Pupils equal round and reactive, fundi not visualized, oral mucosa unremarkable NECK:  Jugular venous distention 8cm at 45 degrees, waveform within normal limits, carotid upstroke brisk and symmetric, no bruits, no thyromegaly LYMPHATICS:  No cervical, inguinal adenopathy LUNGS:  Clear to auscultation bilaterally BACK:  No CVA tenderness CHEST:  Unremarkable HEART:  PMI not displaced or sustained,S1 and S2 within normal limits, no S3, no S4, no clicks, no rubs, no murmurs ABD:  Flat, positive bowel sounds normal in frequency in pitch, no bruits, no rebound, no guarding, no midline pulsatile mass, no hepatomegaly, no splenomegaly EXT:  2 plus pulses throughout, diffuse mild edema, no cyanosis no clubbing   EKG:  Sinus rhythm, rate 80, axis within normal limits, intervals within normal limits, nonspecific inferolateral T wave flattening.  10/15/2011  ASSESSMENT AND PLAN

## 2011-10-18 ENCOUNTER — Other Ambulatory Visit (INDEPENDENT_AMBULATORY_CARE_PROVIDER_SITE_OTHER): Payer: Medicare Other

## 2011-10-18 DIAGNOSIS — N189 Chronic kidney disease, unspecified: Secondary | ICD-10-CM | POA: Diagnosis not present

## 2011-10-18 DIAGNOSIS — I10 Essential (primary) hypertension: Secondary | ICD-10-CM | POA: Diagnosis not present

## 2011-10-18 LAB — BASIC METABOLIC PANEL
BUN: 25 mg/dL — ABNORMAL HIGH (ref 6–23)
CO2: 28 mEq/L (ref 19–32)
Calcium: 8.8 mg/dL (ref 8.4–10.5)
Chloride: 104 mEq/L (ref 96–112)
Creatinine, Ser: 1.6 mg/dL — ABNORMAL HIGH (ref 0.4–1.2)
GFR: 43.08 mL/min — ABNORMAL LOW (ref >60.00)
Glucose, Bld: 113 mg/dL — ABNORMAL HIGH (ref 70–99)
Potassium: 4.1 mEq/L (ref 3.5–5.1)
Sodium: 141 mEq/L (ref 135–145)

## 2011-10-20 ENCOUNTER — Encounter: Payer: Self-pay | Admitting: Internal Medicine

## 2011-10-21 ENCOUNTER — Encounter: Payer: Self-pay | Admitting: *Deleted

## 2011-10-21 DIAGNOSIS — M47817 Spondylosis without myelopathy or radiculopathy, lumbosacral region: Secondary | ICD-10-CM | POA: Diagnosis not present

## 2011-10-22 ENCOUNTER — Other Ambulatory Visit (INDEPENDENT_AMBULATORY_CARE_PROVIDER_SITE_OTHER): Payer: Medicare Other

## 2011-10-22 DIAGNOSIS — N189 Chronic kidney disease, unspecified: Secondary | ICD-10-CM

## 2011-10-22 DIAGNOSIS — I10 Essential (primary) hypertension: Secondary | ICD-10-CM

## 2011-10-22 LAB — BASIC METABOLIC PANEL
BUN: 23 mg/dL (ref 6–23)
CO2: 32 mEq/L (ref 19–32)
Calcium: 9.1 mg/dL (ref 8.4–10.5)
Chloride: 106 mEq/L (ref 96–112)
Creatinine, Ser: 1.4 mg/dL — ABNORMAL HIGH (ref 0.4–1.2)
GFR: 49.26 mL/min — ABNORMAL LOW (ref >60.00)
Glucose, Bld: 107 mg/dL — ABNORMAL HIGH (ref 70–99)
Potassium: 4 mEq/L (ref 3.5–5.1)
Sodium: 141 mEq/L (ref 135–145)

## 2011-10-26 ENCOUNTER — Ambulatory Visit (INDEPENDENT_AMBULATORY_CARE_PROVIDER_SITE_OTHER): Payer: Medicare Other | Admitting: Internal Medicine

## 2011-10-26 ENCOUNTER — Encounter: Payer: Self-pay | Admitting: Internal Medicine

## 2011-10-26 VITALS — BP 156/78 | HR 68 | Ht 64.0 in | Wt 220.0 lb

## 2011-10-26 DIAGNOSIS — R933 Abnormal findings on diagnostic imaging of other parts of digestive tract: Secondary | ICD-10-CM | POA: Diagnosis not present

## 2011-10-26 DIAGNOSIS — R1319 Other dysphagia: Secondary | ICD-10-CM

## 2011-10-26 NOTE — Patient Instructions (Signed)
You have been scheduled for an endoscopy with propofol. Please follow written instructions given to you at your visit today. CC: Dr Unice Cobble

## 2011-10-26 NOTE — Progress Notes (Signed)
Tina Molina May 28, 1945 MRN [id number]   History of Present Illness:  This is a 66 year old African American female with mildchronic anemia and a CT scan of the abdomen from May 2013 showing wall thickening of the post bulbar duodenum with calcification of the abdominal aorta and status post cholecystectomy state. She does not have any specific GI complaints except for scratchiness and lump in her throat which started after she had a thyroidectomy and cervical fusion.She also has a pill dysphagia There is a history of sarcoidosis and hypothyroidism. She has degenerative joint disease of the cervical and lumbosacral spine. She denies heartburn or indigestion. She has been followed by Dr.Hochrein and Dr Marin Olp. She had a colonoscopy in [place] about 4 years ago. She was told it was a normal exam.    Past Medical History  Diagnosis Date  . Pneumonia 04/2010    OP  . Anemia     Associated with leukopenia and lymphocytosis. This is been evaluated by Dr. Marin Olp . Her platelet count has been normal  . Hypertension   . Thyroid disease   . Chronic back pain   . Sarcoidosis   . Lymphocytosis    Past Surgical History  Procedure Date  . Cholecystectomy   . Carpal tunnel release     Bilateral   . Cervical discectomy 06/2010    Dr.Pool  . Thyroidectomy 2003    For goiter  . Colonoscopy 2010    reports that she quit smoking about 14 years ago. She has never used smokeless tobacco. She reports that she does not drink alcohol or use illicit drugs. family history includes Anemia in her brother, father, and sister; Arthritis in her father, mother, and paternal grandmother; Diabetes in her maternal aunt and mother; Glaucoma in her mother; Heart disease (age of onset:75) in her mother; Heart failure (age of onset:80) in her father; Hypertension in her brother and sister; and Kidney disease in her brother and sister. Allergies  Allergen Reactions  . Penicillins     Intolerance or  allergy not remembered; her sister stated penicillin caused "boils under the skin"  . Sulfonamide Derivatives     Arthralgias; her sister stated that this caused a rash.  . Codeine     Nausea only with liquid codeine  . Shellfish Allergy Rash        Review of Systems:Denies dysphagia or odynophagia. She has a lump in her throat and scratching S.  The remainder of the 10 point ROS is negative except as outlined in H&P   Physical Exam: General appearance  Well developed, in no distress.Overweight  Eyes- non icteric. HEENT nontraumatic, normocephalic. Mouth no lesions, tongue papillated, no cheilosis. Neck supple without adenopathy,status post prior thyroidectomy.  no carotid bruits, no JVD.Anterior cervical fusion  Lungs Clear to auscultation bilaterally. Cor normal S1, normal S2, regular rhythm, no murmur,  quiet precordium. Abdomen: Obese protuberant soft with voluntary guarding. Normoactive bowel sounds. No tenderness or mass. No ascites.  Rectal:Small amount of yellow soft Hemoccult negative stool.  Extremities no pedal edema. Skin no lesions. Neurological alert and oriented x 3. Psychological normal mood and affect.  Assessment and Plan:  Problem #1 Abnormal recent CT scan of the abdomen and pelvis indicating mild narrowing of the descending duodenum. This may be a peristaltic effect or possibly duodenitis or extrinsic compression although no pancreatic mass seen on CT scan. This needs to be further evaluated. We will schedule patient for an upper endoscopy.  Problem #2 Throat sensation suggestive of  globus sensation possibly related to a prior thyroidectomy and anterior cervical spine fusion which may result in scar tissue as well as in esophageal hypomotility. Depending on the upper endoscopy, she may need a barium esophagram to assess for motility. For now, she will continue on the acid suppressing agents.  Problem #3 Chronic anemia followed by hematology. She is  Hemoccult-negative today.   10/26/2011 Delfin Edis

## 2011-10-28 ENCOUNTER — Other Ambulatory Visit (INDEPENDENT_AMBULATORY_CARE_PROVIDER_SITE_OTHER): Payer: Medicare Other

## 2011-10-28 DIAGNOSIS — E039 Hypothyroidism, unspecified: Secondary | ICD-10-CM

## 2011-10-28 LAB — ALDOSTERONE + RENIN ACTIVITY W/ RATIO
ALDO / PRA Ratio: 200 Ratio — ABNORMAL HIGH (ref 0.9–28.9)
Aldosterone: 10 ng/dL
PRA LC/MS/MS: 0.05 ng/mL/h — ABNORMAL LOW (ref 0.25–5.82)

## 2011-10-28 LAB — RENIN

## 2011-10-28 NOTE — Progress Notes (Signed)
Labs only

## 2011-10-29 LAB — TSH: TSH: 0.29 u[IU]/mL — ABNORMAL LOW (ref 0.35–5.50)

## 2011-11-01 ENCOUNTER — Other Ambulatory Visit: Payer: Self-pay | Admitting: Internal Medicine

## 2011-11-04 ENCOUNTER — Ambulatory Visit (HOSPITAL_BASED_OUTPATIENT_CLINIC_OR_DEPARTMENT_OTHER): Payer: Medicare Other | Admitting: Hematology & Oncology

## 2011-11-04 ENCOUNTER — Other Ambulatory Visit (HOSPITAL_BASED_OUTPATIENT_CLINIC_OR_DEPARTMENT_OTHER): Payer: Medicare Other | Admitting: Lab

## 2011-11-04 VITALS — BP 145/82 | HR 105 | Temp 97.2°F | Ht 64.0 in | Wt 216.0 lb

## 2011-11-04 DIAGNOSIS — D72819 Decreased white blood cell count, unspecified: Secondary | ICD-10-CM

## 2011-11-04 DIAGNOSIS — I10 Essential (primary) hypertension: Secondary | ICD-10-CM | POA: Diagnosis not present

## 2011-11-04 DIAGNOSIS — D649 Anemia, unspecified: Secondary | ICD-10-CM

## 2011-11-04 DIAGNOSIS — N189 Chronic kidney disease, unspecified: Secondary | ICD-10-CM

## 2011-11-04 DIAGNOSIS — D631 Anemia in chronic kidney disease: Secondary | ICD-10-CM

## 2011-11-04 DIAGNOSIS — D869 Sarcoidosis, unspecified: Secondary | ICD-10-CM | POA: Diagnosis not present

## 2011-11-04 LAB — CBC WITH DIFFERENTIAL (CANCER CENTER ONLY)
BASO#: 0 10*3/uL (ref 0.0–0.2)
BASO%: 0.5 % (ref 0.0–2.0)
EOS%: 4.9 % (ref 0.0–7.0)
Eosinophils Absolute: 0.2 10*3/uL (ref 0.0–0.5)
HCT: 33 % — ABNORMAL LOW (ref 34.8–46.6)
HGB: 11.5 g/dL — ABNORMAL LOW (ref 11.6–15.9)
LYMPH#: 1.9 10*3/uL (ref 0.9–3.3)
LYMPH%: 48.7 % — ABNORMAL HIGH (ref 14.0–48.0)
MCH: 33.5 pg (ref 26.0–34.0)
MCHC: 34.8 g/dL (ref 32.0–36.0)
MCV: 96 fL (ref 81–101)
MONO#: 0.5 10*3/uL (ref 0.1–0.9)
MONO%: 12.9 % (ref 0.0–13.0)
NEUT#: 1.3 10*3/uL — ABNORMAL LOW (ref 1.5–6.5)
NEUT%: 33 % — ABNORMAL LOW (ref 39.6–80.0)
Platelets: 266 10*3/uL (ref 145–400)
RBC: 3.43 10*6/uL — ABNORMAL LOW (ref 3.70–5.32)
RDW: 12.5 % (ref 11.1–15.7)
WBC: 3.9 10*3/uL (ref 3.9–10.0)

## 2011-11-04 LAB — CHCC SATELLITE - SMEAR

## 2011-11-04 NOTE — Progress Notes (Signed)
This office note has been dictated.

## 2011-11-05 NOTE — Progress Notes (Signed)
DIAGNOSES: 1. Normochromic normocytic anemia. 2. Sarcoidosis. 3. Transient leukopenia. 4. Lymphocytosis, chronic.  CURRENT THERAPY:  Observation.  INTERIM HISTORY:  [name] comes in for followup.  She is doing okay. She has her blood pressure under better control.  Last time I saw her, her blood pressure was 210/110.  She was clearly at risk for renal failure.  I told her that if her kidneys failed, her blood would "follow" and she would always need to be given shots.  She has had no problems with the sarcoid.  There has been no cough or shortness of breath.  The patient did have a CT scan of the abdomen on May 16th.  This was done because of a "adrenal tumor."  The CT scan basically showed some wall thickening in the post bulbar duodenum.  The adrenal glands looked okay.  Her last iron studies done were a couple of years ago that I have.  She is currently taking her slew of medications.  She denies any obvious bleeding.  There is no melena or bright red blood per rectum.  She has had no cough.  There has been some leg swelling.  PHYSICAL EXAMINATION:  This is a slightly cushingoid-appearing black female in no obvious distress.  Vital signs:  Pulse 105, respiratory rate 22, blood pressure 145/82.  Weight is 216.  Head and neck: Normocephalic, atraumatic skull.  There are no ocular or oral lesions. There are no palpable cervical or supraclavicular lymph nodes.  Lungs: Clear bilaterally.  She has no rales, wheezes or rhonchi.  Cardiac: Regular rate and rhythm with a normal S1 and S2.  There are no murmurs, rubs or bruits.  Abdomen:  Soft with good bowel sounds.  She is slightly obese.  She has no fluid wave.  There is no palpable abdominal mass. There is no palpable hepatosplenomegaly.  Back:  No tenderness over the spine, ribs, or hips.  She has a well-healed lumbar laminectomy scar. Extremities:  Trace edema in her legs bilaterally.  LABORATORY STUDIES:  White cell count is  3.9, hemoglobin 11.5, hematocrit 33, platelet count 266.  Ferritin 58 and iron saturation 33%. Iron 119.  IMPRESSION:  [name] is a 66 year old African American female with multiple medical problems.  Her blood actually seems to be doing fairly well today. I would not make any changes until we know that she does not have any underlying hematologic issue.  I so doubt that she does.  We will have Ms. Smoot come back in another couple months or so.    ______________________________ Volanda Napoleon, M.D. PRE/MEDQ  D:  11/05/2011  T:  11/05/2011  Job:  [id number]

## 2011-11-09 ENCOUNTER — Telehealth: Payer: Self-pay | Admitting: Internal Medicine

## 2011-11-09 LAB — PROTEIN ELECTROPHORESIS, SERUM, WITH REFLEX
Albumin ELP: 57.9 % (ref 55.8–66.1)
Alpha-1-Globulin: 3.1 % (ref 2.9–4.9)
Alpha-2-Globulin: 8.9 % (ref 7.1–11.8)
Beta 2: 10.3 % — ABNORMAL HIGH (ref 3.2–6.5)
Beta Globulin: 5.9 % (ref 4.7–7.2)
Gamma Globulin: 13.9 % (ref 11.1–18.8)
M-Spike, %: 0.43 g/dL
Total Protein, Serum Electrophoresis: 7.1 g/dL (ref 6.0–8.3)

## 2011-11-09 LAB — ERYTHROPOIETIN: Erythropoietin: 10 m[IU]/mL (ref 2.6–34.0)

## 2011-11-09 LAB — IRON AND TIBC
%SAT: 33 % (ref 20–55)
Iron: 119 ug/dL (ref 42–145)
TIBC: 365 ug/dL (ref 250–470)
UIBC: 246 ug/dL (ref 125–400)

## 2011-11-09 LAB — RETICULOCYTES (CHCC)
ABS Retic: 44.9 10*3/uL (ref 19.0–186.0)
RBC.: 3.45 MIL/uL — ABNORMAL LOW (ref 3.87–5.11)
Retic Ct Pct: 1.3 % (ref 0.4–2.3)

## 2011-11-09 LAB — IGG, IGA, IGM
IgA: 792 mg/dL — ABNORMAL HIGH (ref 69–380)
IgG (Immunoglobin G), Serum: 1180 mg/dL (ref 690–1700)
IgM, Serum: 42 mg/dL — ABNORMAL LOW (ref 52–322)

## 2011-11-09 LAB — IFE INTERPRETATION

## 2011-11-09 LAB — FERRITIN: Ferritin: 58 ng/mL (ref 10–291)

## 2011-11-09 NOTE — Telephone Encounter (Signed)
Called patient wt/information she will call for an appt

## 2011-11-09 NOTE — Telephone Encounter (Signed)
Dr.Hopper please advise, I reviewed OV in March and did not see mention of eye doctor

## 2011-11-09 NOTE — Telephone Encounter (Signed)
Dr Timmothy Sours [facility]

## 2011-11-09 NOTE — Telephone Encounter (Signed)
Patient states when she was in the  Office in March, she was given the name of an eye dr by hopper but can not remember the name. Please review & advise  Patient call back [phone]

## 2011-11-10 DIAGNOSIS — H20029 Recurrent acute iridocyclitis, unspecified eye: Secondary | ICD-10-CM | POA: Diagnosis not present

## 2011-11-16 ENCOUNTER — Other Ambulatory Visit: Payer: Self-pay | Admitting: Internal Medicine

## 2011-11-16 NOTE — Telephone Encounter (Signed)
Refill done.  

## 2011-11-17 ENCOUNTER — Other Ambulatory Visit: Payer: Medicare Other

## 2011-11-19 ENCOUNTER — Encounter: Payer: Self-pay | Admitting: Cardiology

## 2011-11-19 ENCOUNTER — Ambulatory Visit (INDEPENDENT_AMBULATORY_CARE_PROVIDER_SITE_OTHER): Payer: Medicare Other | Admitting: Cardiology

## 2011-11-19 ENCOUNTER — Other Ambulatory Visit (INDEPENDENT_AMBULATORY_CARE_PROVIDER_SITE_OTHER): Payer: Medicare Other

## 2011-11-19 VITALS — BP 120/72 | HR 87 | Ht 64.0 in | Wt 218.4 lb

## 2011-11-19 DIAGNOSIS — N189 Chronic kidney disease, unspecified: Secondary | ICD-10-CM

## 2011-11-19 DIAGNOSIS — I10 Essential (primary) hypertension: Secondary | ICD-10-CM

## 2011-11-19 DIAGNOSIS — R635 Abnormal weight gain: Secondary | ICD-10-CM | POA: Diagnosis not present

## 2011-11-19 LAB — BASIC METABOLIC PANEL
BUN: 39 mg/dL — ABNORMAL HIGH (ref 6–23)
CO2: 28 mEq/L (ref 19–32)
Calcium: 9.8 mg/dL (ref 8.4–10.5)
Chloride: 102 mEq/L (ref 96–112)
Creatinine, Ser: 1.9 mg/dL — ABNORMAL HIGH (ref 0.4–1.2)
GFR: 34.68 mL/min — ABNORMAL LOW (ref >60.00)
Glucose, Bld: 105 mg/dL — ABNORMAL HIGH (ref 70–99)
Potassium: 4.1 mEq/L (ref 3.5–5.1)
Sodium: 141 mEq/L (ref 135–145)

## 2011-11-19 NOTE — Progress Notes (Signed)
HPI The patient returns for followup of difficult to control hypertension.   She does have labs demonstrating elevated renin.  I reviewed this with nephrology. A CT had not suggested any adrenal tumors. She's been managed medically.  Her blood pressure is now much better than it was. She says it's consistently below 140 although occasionally systolics will be in the [id number]. She's had one low reading of 88/70 and she felt weak with this. She otherwise isn't having any presyncope or syncope. She's having no shortness of breath, PND or orthopnea. She's having no chest pressure, neck or arm discomfort. She is limited in her activities by back pain.   Allergies  Allergen Reactions  . Penicillins     Intolerance or allergy not remembered; her sister stated penicillin caused "boils under the skin"  . Sulfonamide Derivatives     Arthralgias; her sister stated that this caused a rash.  . Codeine     Nausea only with liquid codeine  . Shellfish Allergy Rash    Current Outpatient Prescriptions  Medication Sig Dispense Refill  . aspirin 81 MG tablet Take 81 mg by mouth daily.        . budesonide-formoterol (SYMBICORT) 160-4.5 MCG/ACT inhaler Inhale 2 puffs into the lungs 2 (two) times daily.  1 Inhaler  1  . carvedilol (COREG) 25 MG tablet Take 1 tablet (25 mg total) by mouth 2 (two) times daily.  60 tablet  6  . chlorthalidone (HYGROTON) 25 MG tablet take 1 tablet by mouth once daily  30 tablet  0  . Cholecalciferol (VITAMIN D3) 1000 UNITS CAPS Take 1 capsule by mouth daily.      . cloNIDine (CATAPRES) 0.1 MG tablet Take 3 tablets (0.3 mg total) by mouth 2 (two) times daily.  180 tablet  5  . Cyanocobalamin (B-12 SL) Place under the tongue daily.      . cyclobenzaprine (FLEXERIL) 5 MG tablet Take 5 mg by mouth 3 (three) times daily as needed. For muscle pain      . doxepin (SINEQUAN) 10 MG capsule Take 30 mg by mouth at bedtime.       . fluocinonide ointment (LIDEX) 0.05 % USE EVERY OTHER DAY ON  SCALP, NOT ON FACE  60 g  2  . gabapentin (NEURONTIN) 300 MG capsule Take 300 mg by mouth as needed.       Marland Kitchen HYDROcodone-acetaminophen (NORCO) 5-325 MG per tablet Take 1-2 tablets by mouth every 6 (six) hours as needed. For pain      . hydrocortisone 2.5 % cream APPLY TO AFFECTED AREA ONE TIME PER DAY AROUND MOUTH FOR SCALING AND DRYNESS  28 g  1  . KLOR-CON M10 10 MEQ tablet TAKE 1 TABLET BY MOUTH DAILY.  180 tablet  1  . levothyroxine (SYNTHROID, LEVOTHROID) 150 MCG tablet 1 by mouth daily except 1/2 T,TH (New instructions)  34 tablet  3  . losartan (COZAAR) 100 MG tablet TAKE 1 TABLET DAILY IN PLACE OF DIOVAN  30 tablet  11  . meloxicam (MOBIC) 15 MG tablet Take 15 mg by mouth daily.      . ranitidine (ZANTAC) 150 MG tablet take 1 tablet by mouth twice a day  60 tablet  2  . spironolactone (ALDACTONE) 50 MG tablet Take 1 tablet (50 mg total) by mouth daily.  30 tablet  11  . vitamin E 400 UNIT capsule Take 400 Units by mouth daily.       . VOLTAREN 1 % GEL  APPLY 4 GRAMS TO PAINFUL KNEES 4 TIMES A DAY  500 g  1  . DISCONTD: modafinil (PROVIGIL) 100 MG tablet Take 100 mg by mouth daily.      Marland Kitchen DISCONTD: potassium chloride (MICRO-K) 10 MEQ CR capsule Take 1 capsule (10 mEq total) by mouth 2 (two) times daily.  180 capsule  1    Past Medical History  Diagnosis Date  . Pneumonia 04/2010    OP  . Anemia     Associated with leukopenia and lymphocytosis. This is been evaluated by Dr. Marin Olp . Her platelet count has been normal  . Hypertension   . Thyroid disease   . Chronic back pain   . Sarcoidosis   . Lymphocytosis     Past Surgical History  Procedure Date  . Cholecystectomy   . Carpal tunnel release     Bilateral   . Cervical discectomy 06/2010    Dr.Pool  . Thyroidectomy 2003    For goiter  . Colonoscopy 2010    ROS:  Insomnia, fatigue.  Otherwise as stated in the HPI and negative for all other systems.  PHYSICAL EXAM BP 120/72  Pulse 87  Ht 5\' 4"  (1.626 m)  Wt 218 lb  6.4 oz (99.066 kg)  BMI 37.49 kg/m2 GENERAL:  Well appearing HEENT:  Pupils equal round and reactive, fundi not visualized, oral mucosa unremarkable NECK:  Jugular venous distention 8cm at 45 degrees, waveform within normal limits, carotid upstroke brisk and symmetric, no bruits, no thyromegaly LYMPHATICS:  No cervical, inguinal adenopathy LUNGS:  Clear to auscultation bilaterally BACK:  No CVA tenderness, lumbar back scar CHEST:  Unremarkable HEART:  PMI not displaced or sustained,S1 and S2 within normal limits, no S3, no S4, no clicks, no rubs, no murmurs ABD:  Flat, positive bowel sounds normal in frequency in pitch, no bruits, no rebound, no guarding, no midline pulsatile mass, no hepatomegaly, no splenomegaly EXT:  2 plus pulses throughout, diffuse mild edema, no cyanosis no clubbing   ASSESSMENT AND PLAN

## 2011-11-19 NOTE — Patient Instructions (Addendum)
Your physician wants you to follow-up in: 6 months.   You will receive a reminder letter in the mail two months in advance. If you don't receive a letter, please call our office to schedule the follow-up appointment.  [place]

## 2011-11-19 NOTE — Assessment & Plan Note (Signed)
Her blood pressure is much better control. Check a basic metabolic profile today. I will plan on changes to her medicines at this point.

## 2011-11-19 NOTE — Assessment & Plan Note (Signed)
We discussed an exercise strategy.  I have recommended water aerobics at the [facility].

## 2011-11-23 ENCOUNTER — Other Ambulatory Visit: Payer: Self-pay | Admitting: Internal Medicine

## 2011-11-25 ENCOUNTER — Telehealth: Payer: Self-pay | Admitting: Internal Medicine

## 2011-11-25 NOTE — Telephone Encounter (Signed)
Left message for patient to call back  

## 2011-11-25 NOTE — Telephone Encounter (Signed)
Patient thought that she was supposed to fill out forms for her procedure on 12/01/11. I have advised her the she has already received all needed paperwork when she was at the office. She signed all the appropriate forms at that time. Patient verbalizes understanding.

## 2011-11-26 DIAGNOSIS — M5137 Other intervertebral disc degeneration, lumbosacral region: Secondary | ICD-10-CM | POA: Diagnosis not present

## 2011-11-26 DIAGNOSIS — M1710 Unilateral primary osteoarthritis, unspecified knee: Secondary | ICD-10-CM | POA: Diagnosis not present

## 2011-11-26 DIAGNOSIS — M25569 Pain in unspecified knee: Secondary | ICD-10-CM | POA: Diagnosis not present

## 2011-11-26 DIAGNOSIS — IMO0002 Reserved for concepts with insufficient information to code with codable children: Secondary | ICD-10-CM | POA: Diagnosis not present

## 2011-11-30 ENCOUNTER — Other Ambulatory Visit: Payer: Self-pay | Admitting: Internal Medicine

## 2011-11-30 ENCOUNTER — Other Ambulatory Visit: Payer: Self-pay | Admitting: *Deleted

## 2011-11-30 DIAGNOSIS — I10 Essential (primary) hypertension: Secondary | ICD-10-CM

## 2011-11-30 DIAGNOSIS — Z79899 Other long term (current) drug therapy: Secondary | ICD-10-CM

## 2011-12-01 ENCOUNTER — Other Ambulatory Visit: Payer: Self-pay | Admitting: Internal Medicine

## 2011-12-01 ENCOUNTER — Other Ambulatory Visit: Payer: Self-pay | Admitting: *Deleted

## 2011-12-01 ENCOUNTER — Ambulatory Visit (AMBULATORY_SURGERY_CENTER): Payer: Medicare Other | Admitting: Internal Medicine

## 2011-12-01 ENCOUNTER — Encounter: Payer: Self-pay | Admitting: Internal Medicine

## 2011-12-01 ENCOUNTER — Telehealth: Payer: Self-pay | Admitting: *Deleted

## 2011-12-01 VITALS — BP 197/90 | HR 77 | Temp 98.5°F | Resp 15 | Ht 64.0 in | Wt 220.0 lb

## 2011-12-01 DIAGNOSIS — R933 Abnormal findings on diagnostic imaging of other parts of digestive tract: Secondary | ICD-10-CM

## 2011-12-01 DIAGNOSIS — R1310 Dysphagia, unspecified: Secondary | ICD-10-CM

## 2011-12-01 DIAGNOSIS — R1319 Other dysphagia: Secondary | ICD-10-CM

## 2011-12-01 DIAGNOSIS — K2970 Gastritis, unspecified, without bleeding: Secondary | ICD-10-CM

## 2011-12-01 DIAGNOSIS — R9389 Abnormal findings on diagnostic imaging of other specified body structures: Secondary | ICD-10-CM

## 2011-12-01 DIAGNOSIS — K2900 Acute gastritis without bleeding: Secondary | ICD-10-CM

## 2011-12-01 DIAGNOSIS — D649 Anemia, unspecified: Secondary | ICD-10-CM | POA: Diagnosis not present

## 2011-12-01 DIAGNOSIS — J189 Pneumonia, unspecified organism: Secondary | ICD-10-CM | POA: Diagnosis not present

## 2011-12-01 DIAGNOSIS — I10 Essential (primary) hypertension: Secondary | ICD-10-CM | POA: Diagnosis not present

## 2011-12-01 DIAGNOSIS — K2990 Gastroduodenitis, unspecified, without bleeding: Secondary | ICD-10-CM

## 2011-12-01 HISTORY — PX: UPPER GASTROINTESTINAL ENDOSCOPY: SHX188

## 2011-12-01 MED ORDER — SODIUM CHLORIDE 0.9 % IV SOLN: 500.0000 mL | INTRAVENOUS | Status: DC

## 2011-12-01 NOTE — Patient Instructions (Addendum)
Impression/recommendations:  Presbyesophagus Gastritis (handout given)  Post dilation diet handout given  Barium esophogram [facility] December 09, 2011. Arrive at 10:15 am for a 10:30am appointment. Nothing to eat or drink after midnight.  YOU HAD AN ENDOSCOPIC PROCEDURE TODAY AT [facility]: Refer to the procedure report that was given to you for any specific questions about what was found during the examination.  If the procedure report does not answer your questions, please call your gastroenterologist to clarify.  If you requested that your care partner not be given the details of your procedure findings, then the procedure report has been included in a sealed envelope for you to review at your convenience later.  YOU SHOULD EXPECT: Some feelings of bloating in the abdomen. Passage of more gas than usual.  Walking can help get rid of the air that was put into your GI tract during the procedure and reduce the bloating. If you had a lower endoscopy (such as a colonoscopy or flexible sigmoidoscopy) you may notice spotting of blood in your stool or on the toilet paper. If you underwent a bowel prep for your procedure, then you may not have a normal bowel movement for a few days.  DIET: Your first meal following the procedure should be a light meal and then it is ok to progress to your normal diet.  A half-sandwich or bowl of soup is an example of a good first meal.  Heavy or fried foods are harder to digest and may make you feel nauseous or bloated.  Likewise meals heavy in dairy and vegetables can cause extra gas to form and this can also increase the bloating.  Drink plenty of fluids but you should avoid alcoholic beverages for 24 hours.  ACTIVITY: Your care partner should take you home directly after the procedure.  You should plan to take it easy, moving slowly for the rest of the day.  You can resume normal activity the day after the procedure however you should NOT DRIVE  or use heavy machinery for 24 hours (because of the sedation medicines used during the test).    SYMPTOMS TO REPORT IMMEDIATELY: A gastroenterologist can be reached at any hour.  During normal business hours, 8:30 AM to 5:00 PM Monday through Friday, call [phone].  After hours and on weekends, please call the GI answering service at [phone] who will take a message and have the physician on call contact you.    Following upper endoscopy (EGD)  Vomiting of blood or coffee ground material  New chest pain or pain under the shoulder blades  Painful or persistently difficult swallowing  New shortness of breath  Fever of 100F or higher  Black, tarry-looking stools  FOLLOW UP: If any biopsies were taken you will be contacted by phone or by letter within the next 1-3 weeks.  Call your gastroenterologist if you have not heard about the biopsies in 3 weeks.  Our staff will call the home number listed on your records the next business day following your procedure to check on you and address any questions or concerns that you may have at that time regarding the information given to you following your procedure. This is a courtesy call and so if there is no answer at the home number and we have not heard from you through the emergency physician on call, we will assume that you have returned to your regular daily activities without incident.  SIGNATURES/CONFIDENTIALITY: You and/or your care partner  have signed paperwork which will be entered into your electronic medical record.  These signatures attest to the fact that that the information above on your After Visit Summary has been reviewed and is understood.  Full responsibility of the confidentiality of this discharge information lies with you and/or your care-partner.

## 2011-12-01 NOTE — Progress Notes (Signed)
Patient did not have preoperative order for IV antibiotic SSI prophylaxis. (G8918)  Patient did not experience any of the following events: a burn prior to discharge; a fall within the facility; wrong site/side/patient/procedure/implant event; or a hospital transfer or hospital admission upon discharge from the facility. (G8907)  

## 2011-12-01 NOTE — Progress Notes (Addendum)
Unable to advance scope physician removed scope at 1440 and passed maloney  Size 42 pt..pt. Heart rate increased. 1444 physician attempted to reinsert scope oxygen level decreased [place] crna bagged pt. Oxygen increased to 15 while on ambu bag.pt. Suctioned.oxygen level increased pt place back on nasal canula. Pt stable. Procedure completed at 1455.   [id number] Pt. Awake alert and responsive to verbal stmuli prior to going to recovery.

## 2011-12-01 NOTE — Telephone Encounter (Signed)
Per Dr. Olevia Perches patient needs, barium esophagram and UGI series- dysphagia, abnormal CT scan- thickening of duodenum. Scheduled patient at [facility] radiology([facility]) on 12/09/11 at 10:15/10:30. NPO after midnight. Gave appointment to recovery to give to patient.

## 2011-12-01 NOTE — Op Note (Signed)
[facility] [employer]. [place], [place]  [id number]  ENDOSCOPY PROCEDURE REPORT  PATIENT:  Tina Molina, Tina Molina  MR#:  [id number] BIRTHDATE:  Dec 01, 1945, 65 yrs. old  GENDER:  female  ENDOSCOPIST:  Lowella Bandy. Olevia Perches, MD Referred by:  Unice Cobble, M.D.  PROCEDURE DATE:  12/01/2011 PROCEDURE:  EGD with biopsy, 43239, EGD with dilatation over guidewire ASA CLASS:  Class III INDICATIONS:  dysphagia, abnormal imaging CT scan shows ? descending duodenum thickening hx of sarcoidosis, hypothyroidism  MEDICATIONS:   MAC sedation, administered by CRNA, propofol (Diprivan) 350 mg, glycopyrrolate (Robinal) 0.2 mg TOPICAL ANESTHETIC:  none  DESCRIPTION OF PROCEDURE:   After the risks benefits and alternatives of the procedure were thoroughly explained, informed consent was obtained.  The LB GIF-H180 [id number] endoscope was introduced through the mouth and advanced to the second portion of the duodenum, without limitations.  The instrument was slowly withdrawn as the mucosa was fully examined. <<PROCEDUREIMAGES>>  Presbyesophagus was found. spasm of the UES, unable to intubate initially, had to use 38F Maloney dil to prime the UES, Savary dilation over a guidewire 17 mm dilator passed with resistance, no blood on the dilator  Mild gastritis was found. diffuse erythema, bile retention With standard forceps, a biopsy was obtained and sent to pathology (see image1 and image3).  the duodenal bulb and second duodenum were without abnormalities. no evidence of stenosis or thickening as suggested on CT scan    Retroflexed views revealed no abnormalities.    The scope was then withdrawn from the patient and the procedure completed.  COMPLICATIONS:  A complication of hypoxemia occurred on 12/01/2011 at.  ENDOSCOPIC IMPRESSION: 1) Presbyesophagus 2) Mild gastritis 3) Nl duodenum UES spasm, may be causing dysphagia bile gastritis normal decending duodenum s/p passage of 17 mm  dilator RECOMMENDATIONS: 1) Await biopsy results Barium esophagram and UGI series  REPEAT EXAM:  In 0 year(s) for.  ______________________________ Lowella Bandy. Olevia Perches, MD  CC:  n. eSIGNED:   Lowella Bandy. Pantera Winterrowd at 12/01/2011 03:10 PM  Charlotta Newton, [id number]

## 2011-12-02 ENCOUNTER — Telehealth: Payer: Self-pay | Admitting: *Deleted

## 2011-12-02 NOTE — Telephone Encounter (Signed)
  Follow up Call-  Call back number 12/01/2011  Post procedure Call Back phone  # [phone]  Permission to leave phone message Yes     Patient questions:  Do you have a fever, pain , or abdominal swelling? yes Pain Score  3 *  Have you tolerated food without any problems? no   Patient states a sore throat; told to use chloraseptic spray and salt water gargles; will call if worsens  Have you been able to return to your normal activities? yes  Do you have any questions about your discharge instructions: Diet   yes Medications  no Follow up visit  no  Do you have questions or concerns about your Care? yes  Actions: * If pain score is 4 or above:3 No action needed, pain <4.

## 2011-12-07 ENCOUNTER — Encounter: Payer: Self-pay | Admitting: Internal Medicine

## 2011-12-08 DIAGNOSIS — H04129 Dry eye syndrome of unspecified lacrimal gland: Secondary | ICD-10-CM | POA: Diagnosis not present

## 2011-12-09 ENCOUNTER — Ambulatory Visit (HOSPITAL_COMMUNITY): Payer: Medicare Other

## 2011-12-09 ENCOUNTER — Ambulatory Visit (HOSPITAL_COMMUNITY)
Admission: RE | Admit: 2011-12-09 | Discharge: 2011-12-09 | Disposition: A | Discharge status: Home / Self Care | Payer: Medicare Other | Source: Ambulatory Visit | Attending: Internal Medicine | Admitting: Internal Medicine

## 2011-12-09 DIAGNOSIS — R1310 Dysphagia, unspecified: Secondary | ICD-10-CM

## 2011-12-09 DIAGNOSIS — R141 Gas pain: Secondary | ICD-10-CM | POA: Diagnosis not present

## 2011-12-09 DIAGNOSIS — R143 Flatulence: Secondary | ICD-10-CM | POA: Diagnosis not present

## 2011-12-14 ENCOUNTER — Telehealth: Payer: Self-pay | Admitting: Internal Medicine

## 2011-12-14 ENCOUNTER — Other Ambulatory Visit: Payer: Self-pay | Admitting: Internal Medicine

## 2011-12-14 NOTE — Telephone Encounter (Signed)
Pt coming in Thursday, 12/16/11.

## 2011-12-14 NOTE — Telephone Encounter (Signed)
Message copied by Dorian Pod on Tue Dec 14, 2011  3:09 PM ------      Message from: Hendricks Limes      Created: Tue Dec 14, 2011  2:53 PM       Please see me prior to refilling potassium and spironolactone  there has been some decrease in kidney function

## 2011-12-16 ENCOUNTER — Ambulatory Visit (INDEPENDENT_AMBULATORY_CARE_PROVIDER_SITE_OTHER): Payer: Medicare Other | Admitting: Internal Medicine

## 2011-12-16 ENCOUNTER — Encounter: Payer: Self-pay | Admitting: Internal Medicine

## 2011-12-16 VITALS — BP 126/82 | HR 99 | Wt 217.0 lb

## 2011-12-16 DIAGNOSIS — N189 Chronic kidney disease, unspecified: Secondary | ICD-10-CM

## 2011-12-16 DIAGNOSIS — I10 Essential (primary) hypertension: Secondary | ICD-10-CM

## 2011-12-16 NOTE — Assessment & Plan Note (Signed)
Blood pressure has improved on the present regimen; but her renal insufficiency has increased.

## 2011-12-16 NOTE — Assessment & Plan Note (Signed)
She is on spironolactone and an angiotensin receptor blocker. Hygroton and potassium supplement will be discontinued.  BP & Renal function will be  monitored closely.

## 2011-12-16 NOTE — Patient Instructions (Addendum)
Blood Pressure Goal is average  < 140/90; ideally is an AVERAGE < 135/85. This AVERAGE should be calculated from @ least 5-7 BP readings taken @ different times of day on different days of week. You should not respond to isolated BP readings , but rather the AVERAGE for that week .  BUN, creatinine, and GFR  all assess kidney function. To protect the kidneys it  is important to control your blood pressure and sugar. You should also stay well hydrated. Drink to thirst, up to 32 ounces of fluid per day.   PLEASE BRING THESE INSTRUCTIONS TO FOLLOW UP  LAB APPOINTMENT in 3 weeks for  Fasting BMET.This will guarantee correct labs are drawn, eliminating need for repeat blood sampling ( needle sticks ! ). Diagnoses /Codes: 401.9  Please try to go on My Chart  to allow me to release the results directly to you.

## 2011-12-16 NOTE — Progress Notes (Signed)
  Subjective:    Patient ID: Tina Molina, female    DOB: May 27, 1945, 66 y.o.   MRN: [id number]  HPI On her present regimen her blood pressures have been well controlled in the range of "130 +/ 70+". She denies chest pain, palpitations, claudications, or significant edema.  Her evaluation has revealed low plasma renin activity but elevated aldosterone. Aldosterone to renin  plasma  activity ratio was significantly elevated @  100,normal being less than 28.9. She was placed on spironolactone 50 mg daily; she's been on chlorthalidone 25 mg daily chronically.  Her creatinine was 1.4, BUN 23, and GFR 49.26 on 67/13. On 11/19/11 these were 1.9, 39, and 34.68. She is on a potassium supplement and also on an angiotensin receptor blocker. Her potassium has ranged from a low of 3.8 to a high of 4.1.      Review of Systems     Objective:   Physical Exam She appears  well-nourished; he is in no acute distress  No carotid bruits are present.  Heart rhythm and rate are normal with no  gallops.Grade 1/6 systolic murmur   Chest is clear with no increased work of breathing  There is no evidence of aortic aneurysm or renal artery bruits  She has no clubbing or cyanosis. Lipedema @ ankles   Pedal pulses are intact   No ischemic skin changes are present         Assessment & Plan:

## 2011-12-20 ENCOUNTER — Other Ambulatory Visit: Payer: Medicare Other

## 2012-01-06 ENCOUNTER — Other Ambulatory Visit (INDEPENDENT_AMBULATORY_CARE_PROVIDER_SITE_OTHER): Payer: Medicare Other

## 2012-01-06 ENCOUNTER — Other Ambulatory Visit: Payer: Medicare Other

## 2012-01-06 DIAGNOSIS — I10 Essential (primary) hypertension: Secondary | ICD-10-CM

## 2012-01-06 DIAGNOSIS — Z79899 Other long term (current) drug therapy: Secondary | ICD-10-CM

## 2012-01-06 LAB — BASIC METABOLIC PANEL
BUN: 19 mg/dL (ref 6–23)
CO2: 29 mEq/L (ref 19–32)
Calcium: 9.2 mg/dL (ref 8.4–10.5)
Chloride: 102 mEq/L (ref 96–112)
Creatinine, Ser: 1.1 mg/dL (ref 0.4–1.2)
GFR: 65.33 mL/min (ref >60.00)
Glucose, Bld: 113 mg/dL — ABNORMAL HIGH (ref 70–99)
Potassium: 3.9 mEq/L (ref 3.5–5.1)
Sodium: 138 mEq/L (ref 135–145)

## 2012-01-07 ENCOUNTER — Other Ambulatory Visit: Payer: Self-pay

## 2012-01-10 ENCOUNTER — Telehealth: Payer: Self-pay | Admitting: Cardiology

## 2012-01-10 ENCOUNTER — Telehealth: Payer: Self-pay

## 2012-01-10 NOTE — Telephone Encounter (Signed)
left message for patient to call office about lab results

## 2012-01-10 NOTE — Telephone Encounter (Signed)
Left message for patient to call office.  

## 2012-01-11 ENCOUNTER — Telehealth: Payer: Self-pay

## 2012-01-11 NOTE — Telephone Encounter (Signed)
Patient returning call from [facility], she can be reached at [phone]

## 2012-01-11 NOTE — Telephone Encounter (Signed)
Message copied by Audria Nine on Tue Jan 11, 2012  1:50 PM ------      Message from: Minus Breeding      Created: Thu Jan 06, 2012  6:27 PM       Labs OK.

## 2012-01-11 NOTE — Telephone Encounter (Signed)
Patient aware of labs results 

## 2012-01-13 DIAGNOSIS — M47817 Spondylosis without myelopathy or radiculopathy, lumbosacral region: Secondary | ICD-10-CM | POA: Diagnosis not present

## 2012-01-19 ENCOUNTER — Other Ambulatory Visit: Payer: Self-pay | Admitting: Internal Medicine

## 2012-01-19 DIAGNOSIS — Z1231 Encounter for screening mammogram for malignant neoplasm of breast: Secondary | ICD-10-CM

## 2012-01-24 DIAGNOSIS — M545 Low back pain, unspecified: Secondary | ICD-10-CM | POA: Diagnosis not present

## 2012-01-28 DIAGNOSIS — M545 Low back pain, unspecified: Secondary | ICD-10-CM | POA: Diagnosis not present

## 2012-01-31 ENCOUNTER — Ambulatory Visit
Admission: RE | Admit: 2012-01-31 | Discharge: 2012-01-31 | Disposition: A | Discharge status: Home / Self Care | Payer: Medicare Other | Source: Ambulatory Visit | Attending: Internal Medicine | Admitting: Internal Medicine

## 2012-01-31 DIAGNOSIS — Z1231 Encounter for screening mammogram for malignant neoplasm of breast: Secondary | ICD-10-CM | POA: Diagnosis not present

## 2012-02-01 DIAGNOSIS — M545 Low back pain, unspecified: Secondary | ICD-10-CM | POA: Diagnosis not present

## 2012-02-03 ENCOUNTER — Telehealth: Payer: Self-pay | Admitting: Hematology & Oncology

## 2012-02-03 DIAGNOSIS — M545 Low back pain, unspecified: Secondary | ICD-10-CM | POA: Diagnosis not present

## 2012-02-03 NOTE — Telephone Encounter (Signed)
Patient called and cx 02/04/12 appt and stated she will call back to resch

## 2012-02-04 ENCOUNTER — Other Ambulatory Visit: Payer: Medicare Other | Admitting: Lab

## 2012-02-04 ENCOUNTER — Ambulatory Visit: Payer: Medicare Other | Admitting: Hematology & Oncology

## 2012-02-07 DIAGNOSIS — M545 Low back pain, unspecified: Secondary | ICD-10-CM | POA: Diagnosis not present

## 2012-02-10 DIAGNOSIS — M47817 Spondylosis without myelopathy or radiculopathy, lumbosacral region: Secondary | ICD-10-CM | POA: Diagnosis not present

## 2012-02-10 DIAGNOSIS — M545 Low back pain, unspecified: Secondary | ICD-10-CM | POA: Diagnosis not present

## 2012-02-15 ENCOUNTER — Other Ambulatory Visit: Payer: Self-pay | Admitting: Internal Medicine

## 2012-02-15 NOTE — Telephone Encounter (Signed)
TSH 244.9 

## 2012-02-23 ENCOUNTER — Encounter (HOSPITAL_COMMUNITY): Payer: Self-pay | Admitting: Pharmacy Technician

## 2012-02-24 ENCOUNTER — Other Ambulatory Visit: Payer: Self-pay | Admitting: Neurosurgery

## 2012-02-25 ENCOUNTER — Encounter (HOSPITAL_COMMUNITY): Payer: Self-pay

## 2012-02-25 ENCOUNTER — Encounter (HOSPITAL_COMMUNITY)
Admission: RE | Admit: 2012-02-25 | Discharge: 2012-02-25 | Disposition: A | Discharge status: Home / Self Care | Payer: Medicare Other | Source: Ambulatory Visit | Attending: Neurosurgery | Admitting: Neurosurgery

## 2012-02-25 ENCOUNTER — Ambulatory Visit (HOSPITAL_COMMUNITY)
Admission: RE | Admit: 2012-02-25 | Discharge: 2012-02-25 | Disposition: A | Discharge status: Home / Self Care | Payer: Medicare Other | Source: Ambulatory Visit | Attending: Neurosurgery | Admitting: Neurosurgery

## 2012-02-25 DIAGNOSIS — Z01812 Encounter for preprocedural laboratory examination: Secondary | ICD-10-CM | POA: Diagnosis not present

## 2012-02-25 DIAGNOSIS — Z01818 Encounter for other preprocedural examination: Secondary | ICD-10-CM | POA: Diagnosis not present

## 2012-02-25 DIAGNOSIS — J984 Other disorders of lung: Secondary | ICD-10-CM | POA: Diagnosis not present

## 2012-02-25 DIAGNOSIS — Z01811 Encounter for preprocedural respiratory examination: Secondary | ICD-10-CM | POA: Diagnosis not present

## 2012-02-25 HISTORY — DX: Disorder of kidney and ureter, unspecified: N28.9

## 2012-02-25 HISTORY — DX: Unspecified osteoarthritis, unspecified site: M19.90

## 2012-02-25 HISTORY — DX: Chronic kidney disease, unspecified: N18.9

## 2012-02-25 LAB — BASIC METABOLIC PANEL WITH GFR
BUN: 17 mg/dL (ref 6–23)
CO2: 25 meq/L (ref 19–32)
Calcium: 9.7 mg/dL (ref 8.4–10.5)
Chloride: 105 meq/L (ref 96–112)
Creatinine, Ser: 1.21 mg/dL — ABNORMAL HIGH (ref 0.50–1.10)
GFR calc Af Amer: 53 mL/min — ABNORMAL LOW
GFR calc non Af Amer: 46 mL/min — ABNORMAL LOW
Glucose, Bld: 101 mg/dL — ABNORMAL HIGH (ref 70–99)
Potassium: 4.1 meq/L (ref 3.5–5.1)
Sodium: 142 meq/L (ref 135–145)

## 2012-02-25 LAB — CBC WITH DIFFERENTIAL/PLATELET
Basophils Absolute: 0 K/uL (ref 0.0–0.1)
Basophils Relative: 1 % (ref 0–1)
Eosinophils Absolute: 0.1 K/uL (ref 0.0–0.7)
Eosinophils Relative: 2 % (ref 0–5)
HCT: 33.4 % — ABNORMAL LOW (ref 36.0–46.0)
Hemoglobin: 11.5 g/dL — ABNORMAL LOW (ref 12.0–15.0)
Lymphocytes Relative: 57 % — ABNORMAL HIGH (ref 12–46)
Lymphs Abs: 3.1 K/uL (ref 0.7–4.0)
MCH: 32.9 pg (ref 26.0–34.0)
MCHC: 34.4 g/dL (ref 30.0–36.0)
MCV: 95.4 fL (ref 78.0–100.0)
Monocytes Absolute: 0.6 K/uL (ref 0.1–1.0)
Monocytes Relative: 10 % (ref 3–12)
Neutro Abs: 1.6 K/uL — ABNORMAL LOW (ref 1.7–7.7)
Neutrophils Relative %: 29 % — ABNORMAL LOW (ref 43–77)
Platelets: 275 K/uL (ref 150–400)
RBC: 3.5 MIL/uL — ABNORMAL LOW (ref 3.87–5.11)
RDW: 13 % (ref 11.5–15.5)
WBC: 5.4 K/uL (ref 4.0–10.5)

## 2012-02-25 LAB — SURGICAL PCR SCREEN
MRSA, PCR: NEGATIVE
Staphylococcus aureus: NEGATIVE

## 2012-02-25 NOTE — Pre-Procedure Instructions (Signed)
[facility]  02/25/2012   Your procedure is scheduled on:  Tuesday, October 15  Report to [facility] at 0830 AM.  Call this number if you have problems the morning of surgery: [phone]   Remember:   Do not eat food or drink any liquids After Midnight.     Take these medicines the morning of surgery with A SIP OF WATER: Carvedilol,Clonidine,Gabapentin,Hydrocodone,Levothyroxine,ranitidine,   Do not wear jewelry, make-up or nail polish.  Do not wear lotions, powders, or perfumes. You may wear deodorant.  Do not shave 48 hours prior to surgery. Men may shave face and neck.  Do not bring valuables to the hospital.  Contacts, dentures or bridgework may not be worn into surgery.  Leave suitcase in the car. After surgery it may be brought to your room.  For patients admitted to the hospital, checkout time is 11:00 AM the day of discharge.     Special Instructions: Shower using CHG 2 nights before surgery and the night before surgery.  If you shower the day of surgery use CHG.  Use special wash - you have one bottle of CHG for all showers.  You should use approximately 1/3 of the bottle for each shower.   Please read over the following fact sheets that you were given: Pain Booklet, Coughing and Deep Breathing, Blood Transfusion Information, MRSA Information and Surgical Site Infection Prevention

## 2012-02-28 MED ORDER — VANCOMYCIN HCL 1000 MG IV SOLR
1500.0000 mg | INTRAVENOUS | Status: AC
  Administered 2012-02-29: 1500 mg via INTRAVENOUS
  Filled 2012-02-28: qty 1500

## 2012-02-28 MED ORDER — DEXAMETHASONE SODIUM PHOSPHATE 10 MG/ML IJ SOLN
10.0000 mg | INTRAMUSCULAR | Status: AC
  Administered 2012-02-29: 10 mg via INTRAVENOUS
  Filled 2012-02-28: qty 1

## 2012-02-29 ENCOUNTER — Encounter (HOSPITAL_COMMUNITY)
Admission: RE | Disposition: A | Discharge status: Skilled Nursing Facility | Payer: Self-pay | Source: Ambulatory Visit | Attending: Neurosurgery

## 2012-02-29 ENCOUNTER — Inpatient Hospital Stay (HOSPITAL_COMMUNITY): Payer: Medicare Other | Admitting: Critical Care Medicine

## 2012-02-29 ENCOUNTER — Inpatient Hospital Stay (HOSPITAL_COMMUNITY): Payer: Medicare Other

## 2012-02-29 ENCOUNTER — Encounter (HOSPITAL_COMMUNITY): Payer: Self-pay | Admitting: Neurosurgery

## 2012-02-29 ENCOUNTER — Encounter (HOSPITAL_COMMUNITY): Payer: Self-pay | Admitting: Critical Care Medicine

## 2012-02-29 ENCOUNTER — Encounter (HOSPITAL_COMMUNITY): Payer: Self-pay | Admitting: *Deleted

## 2012-02-29 ENCOUNTER — Inpatient Hospital Stay (HOSPITAL_COMMUNITY)
Admission: RE | Admit: 2012-02-29 | Discharge: 2012-03-11 | DRG: 459 | Disposition: A | Discharge status: Skilled Nursing Facility | Payer: Medicare Other | Source: Ambulatory Visit | Attending: Neurosurgery | Admitting: Neurosurgery

## 2012-02-29 DIAGNOSIS — R578 Other shock: Secondary | ICD-10-CM | POA: Diagnosis not present

## 2012-02-29 DIAGNOSIS — M4800 Spinal stenosis, site unspecified: Secondary | ICD-10-CM | POA: Diagnosis not present

## 2012-02-29 DIAGNOSIS — Z882 Allergy status to sulfonamides status: Secondary | ICD-10-CM | POA: Diagnosis not present

## 2012-02-29 DIAGNOSIS — M549 Dorsalgia, unspecified: Secondary | ICD-10-CM | POA: Diagnosis not present

## 2012-02-29 DIAGNOSIS — Z888 Allergy status to other drugs, medicaments and biological substances status: Secondary | ICD-10-CM | POA: Diagnosis not present

## 2012-02-29 DIAGNOSIS — N289 Disorder of kidney and ureter, unspecified: Secondary | ICD-10-CM | POA: Diagnosis not present

## 2012-02-29 DIAGNOSIS — Z880 Allergy status to penicillin: Secondary | ICD-10-CM

## 2012-02-29 DIAGNOSIS — N189 Chronic kidney disease, unspecified: Secondary | ICD-10-CM | POA: Diagnosis not present

## 2012-02-29 DIAGNOSIS — D62 Acute posthemorrhagic anemia: Secondary | ICD-10-CM | POA: Diagnosis not present

## 2012-02-29 DIAGNOSIS — R0609 Other forms of dyspnea: Secondary | ICD-10-CM | POA: Diagnosis not present

## 2012-02-29 DIAGNOSIS — Z79899 Other long term (current) drug therapy: Secondary | ICD-10-CM | POA: Diagnosis not present

## 2012-02-29 DIAGNOSIS — N179 Acute kidney failure, unspecified: Secondary | ICD-10-CM | POA: Diagnosis not present

## 2012-02-29 DIAGNOSIS — Z87891 Personal history of nicotine dependence: Secondary | ICD-10-CM

## 2012-02-29 DIAGNOSIS — Z8261 Family history of arthritis: Secondary | ICD-10-CM | POA: Diagnosis not present

## 2012-02-29 DIAGNOSIS — R0902 Hypoxemia: Secondary | ICD-10-CM | POA: Diagnosis not present

## 2012-02-29 DIAGNOSIS — M5137 Other intervertebral disc degeneration, lumbosacral region: Secondary | ICD-10-CM | POA: Diagnosis not present

## 2012-02-29 DIAGNOSIS — J9600 Acute respiratory failure, unspecified whether with hypoxia or hypercapnia: Secondary | ICD-10-CM | POA: Diagnosis not present

## 2012-02-29 DIAGNOSIS — R609 Edema, unspecified: Secondary | ICD-10-CM

## 2012-02-29 DIAGNOSIS — M48061 Spinal stenosis, lumbar region without neurogenic claudication: Secondary | ICD-10-CM | POA: Diagnosis not present

## 2012-02-29 DIAGNOSIS — I9589 Other hypotension: Secondary | ICD-10-CM | POA: Diagnosis not present

## 2012-02-29 DIAGNOSIS — M48062 Spinal stenosis, lumbar region with neurogenic claudication: Principal | ICD-10-CM | POA: Diagnosis present

## 2012-02-29 DIAGNOSIS — Z4889 Encounter for other specified surgical aftercare: Secondary | ICD-10-CM | POA: Diagnosis not present

## 2012-02-29 DIAGNOSIS — M79609 Pain in unspecified limb: Secondary | ICD-10-CM | POA: Diagnosis not present

## 2012-02-29 DIAGNOSIS — Z5189 Encounter for other specified aftercare: Secondary | ICD-10-CM | POA: Diagnosis not present

## 2012-02-29 DIAGNOSIS — R262 Difficulty in walking, not elsewhere classified: Secondary | ICD-10-CM | POA: Diagnosis not present

## 2012-02-29 DIAGNOSIS — M6281 Muscle weakness (generalized): Secondary | ICD-10-CM | POA: Diagnosis not present

## 2012-02-29 DIAGNOSIS — M961 Postlaminectomy syndrome, not elsewhere classified: Secondary | ICD-10-CM | POA: Diagnosis not present

## 2012-02-29 DIAGNOSIS — J9819 Other pulmonary collapse: Secondary | ICD-10-CM | POA: Diagnosis not present

## 2012-02-29 DIAGNOSIS — Z833 Family history of diabetes mellitus: Secondary | ICD-10-CM | POA: Diagnosis not present

## 2012-02-29 DIAGNOSIS — I129 Hypertensive chronic kidney disease with stage 1 through stage 4 chronic kidney disease, or unspecified chronic kidney disease: Secondary | ICD-10-CM | POA: Diagnosis present

## 2012-02-29 DIAGNOSIS — I369 Nonrheumatic tricuspid valve disorder, unspecified: Secondary | ICD-10-CM | POA: Diagnosis not present

## 2012-02-29 DIAGNOSIS — I10 Essential (primary) hypertension: Secondary | ICD-10-CM | POA: Diagnosis present

## 2012-02-29 DIAGNOSIS — Z9889 Other specified postprocedural states: Secondary | ICD-10-CM | POA: Diagnosis not present

## 2012-02-29 DIAGNOSIS — Z7982 Long term (current) use of aspirin: Secondary | ICD-10-CM | POA: Diagnosis not present

## 2012-02-29 DIAGNOSIS — D869 Sarcoidosis, unspecified: Secondary | ICD-10-CM | POA: Diagnosis present

## 2012-02-29 DIAGNOSIS — K219 Gastro-esophageal reflux disease without esophagitis: Secondary | ICD-10-CM | POA: Diagnosis present

## 2012-02-29 DIAGNOSIS — M545 Low back pain, unspecified: Secondary | ICD-10-CM | POA: Diagnosis not present

## 2012-02-29 DIAGNOSIS — N184 Chronic kidney disease, stage 4 (severe): Secondary | ICD-10-CM | POA: Diagnosis present

## 2012-02-29 DIAGNOSIS — R918 Other nonspecific abnormal finding of lung field: Secondary | ICD-10-CM | POA: Diagnosis not present

## 2012-02-29 DIAGNOSIS — R069 Unspecified abnormalities of breathing: Secondary | ICD-10-CM | POA: Diagnosis not present

## 2012-02-29 DIAGNOSIS — J984 Other disorders of lung: Secondary | ICD-10-CM | POA: Diagnosis not present

## 2012-02-29 DIAGNOSIS — Z8249 Family history of ischemic heart disease and other diseases of the circulatory system: Secondary | ICD-10-CM

## 2012-02-29 DIAGNOSIS — Z91013 Allergy to seafood: Secondary | ICD-10-CM | POA: Diagnosis not present

## 2012-02-29 DIAGNOSIS — J95821 Acute postprocedural respiratory failure: Secondary | ICD-10-CM | POA: Diagnosis not present

## 2012-02-29 DIAGNOSIS — M519 Unspecified thoracic, thoracolumbar and lumbosacral intervertebral disc disorder: Secondary | ICD-10-CM | POA: Diagnosis not present

## 2012-02-29 DIAGNOSIS — I959 Hypotension, unspecified: Secondary | ICD-10-CM | POA: Diagnosis present

## 2012-02-29 HISTORY — PX: POSTERIOR LUMBAR FUSION: SHX6036

## 2012-02-29 SURGERY — POSTERIOR LUMBAR FUSION 3 LEVEL
Anesthesia: General | Site: Back | Wound class: Clean

## 2012-02-29 MED ORDER — GLYCOPYRROLATE 0.2 MG/ML IJ SOLN
INTRAMUSCULAR | Status: DC | PRN
  Administered 2012-02-29: .6 mg via INTRAVENOUS

## 2012-02-29 MED ORDER — VECURONIUM BROMIDE 10 MG IV SOLR
INTRAVENOUS | Status: DC | PRN
  Administered 2012-02-29: 1 mg via INTRAVENOUS
  Administered 2012-02-29 (×2): 2 mg via INTRAVENOUS
  Administered 2012-02-29: 4 mg via INTRAVENOUS
  Administered 2012-02-29 (×2): 2 mg via INTRAVENOUS

## 2012-02-29 MED ORDER — BUPIVACAINE HCL (PF) 0.25 % IJ SOLN
INTRAMUSCULAR | Status: DC | PRN
  Administered 2012-02-29: 30 mL

## 2012-02-29 MED ORDER — THROMBIN 20000 UNITS EX SOLR
CUTANEOUS | Status: DC | PRN
  Administered 2012-02-29: 13:00:00 via TOPICAL

## 2012-02-29 MED ORDER — LEVOTHYROXINE SODIUM 75 MCG PO TABS
75.0000 ug | ORAL_TABLET | ORAL | Status: DC
  Administered 2012-03-02 – 2012-03-09 (×3): 75 ug via ORAL
  Filled 2012-02-29 (×4): qty 1

## 2012-02-29 MED ORDER — PHENOL 1.4 % MT LIQD: 1.0000 | OROMUCOSAL | Status: DC | PRN

## 2012-02-29 MED ORDER — POLYETHYLENE GLYCOL 3350 17 G PO PACK
17.0000 g | PACK | Freq: Every day | ORAL | Status: DC | PRN
  Filled 2012-02-29: qty 1

## 2012-02-29 MED ORDER — LEVOTHYROXINE SODIUM 75 MCG PO TABS: 75.0000 ug | ORAL_TABLET | Freq: Every day | ORAL | Status: DC

## 2012-02-29 MED ORDER — SODIUM CHLORIDE 0.9 % IR SOLN
Status: DC | PRN
  Administered 2012-02-29: 13:00:00

## 2012-02-29 MED ORDER — HYDROMORPHONE HCL PF 1 MG/ML IJ SOLN
INTRAMUSCULAR | Status: AC
  Filled 2012-02-29: qty 1

## 2012-02-29 MED ORDER — NEOSTIGMINE METHYLSULFATE 1 MG/ML IJ SOLN
INTRAMUSCULAR | Status: DC | PRN
  Administered 2012-02-29: 4 mg via INTRAVENOUS

## 2012-02-29 MED ORDER — PROMETHAZINE HCL 25 MG/ML IJ SOLN: 6.2500 mg | INTRAMUSCULAR | Status: DC | PRN

## 2012-02-29 MED ORDER — CARVEDILOL 25 MG PO TABS
25.0000 mg | ORAL_TABLET | Freq: Two times a day (BID) | ORAL | Status: DC
  Administered 2012-03-01 – 2012-03-03 (×4): 25 mg via ORAL
  Filled 2012-02-29 (×8): qty 1

## 2012-02-29 MED ORDER — ASPIRIN EC 81 MG PO TBEC
81.0000 mg | DELAYED_RELEASE_TABLET | Freq: Every day | ORAL | Status: DC
Start: 2012-03-01 — End: 2012-03-11
  Administered 2012-03-01 – 2012-03-11 (×11): 81 mg via ORAL
  Filled 2012-02-29 (×11): qty 1

## 2012-02-29 MED ORDER — SODIUM CHLORIDE 0.9 % IV SOLN
250.0000 mL | INTRAVENOUS | Status: DC
  Administered 2012-03-01: 1000 mL via INTRAVENOUS

## 2012-02-29 MED ORDER — LACTATED RINGERS IV SOLN
INTRAVENOUS | Status: DC | PRN
  Administered 2012-02-29 (×2): via INTRAVENOUS

## 2012-02-29 MED ORDER — DIAZEPAM 5 MG PO TABS
5.0000 mg | ORAL_TABLET | Freq: Four times a day (QID) | ORAL | Status: DC | PRN
  Administered 2012-02-29: 5 mg via ORAL
  Administered 2012-03-02: 10 mg via ORAL
  Administered 2012-03-02: 5 mg via ORAL
  Administered 2012-03-02: 10 mg via ORAL
  Filled 2012-02-29: qty 1
  Filled 2012-02-29: qty 2
  Filled 2012-02-29 (×2): qty 1
  Filled 2012-02-29: qty 2

## 2012-02-29 MED ORDER — ASPIRIN 81 MG PO TABS: 81.0000 mg | ORAL_TABLET | Freq: Every day | ORAL | Status: DC

## 2012-02-29 MED ORDER — CEFAZOLIN SODIUM 1-5 GM-% IV SOLN
1.0000 g | Freq: Three times a day (TID) | INTRAVENOUS | Status: AC
  Administered 2012-03-01 (×2): 1 g via INTRAVENOUS
  Filled 2012-02-29 (×3): qty 50

## 2012-02-29 MED ORDER — HYDROCODONE-ACETAMINOPHEN 5-325 MG PO TABS
1.0000 | ORAL_TABLET | ORAL | Status: DC | PRN
  Administered 2012-03-01 – 2012-03-02 (×2): 2 via ORAL
  Filled 2012-02-29 (×2): qty 2

## 2012-02-29 MED ORDER — SODIUM CHLORIDE 0.9 % IJ SOLN: 3.0000 mL | INTRAMUSCULAR | Status: DC | PRN

## 2012-02-29 MED ORDER — VITAMIN E 180 MG (400 UNIT) PO CAPS
400.0000 [IU] | ORAL_CAPSULE | Freq: Every day | ORAL | Status: DC
  Administered 2012-03-01 – 2012-03-03 (×3): 400 [IU] via ORAL
  Filled 2012-02-29 (×4): qty 1

## 2012-02-29 MED ORDER — ALBUMIN HUMAN 5 % IV SOLN
INTRAVENOUS | Status: DC | PRN
  Administered 2012-02-29 (×2): via INTRAVENOUS

## 2012-02-29 MED ORDER — LOSARTAN POTASSIUM 50 MG PO TABS
100.0000 mg | ORAL_TABLET | Freq: Every day | ORAL | Status: DC
  Administered 2012-03-01 – 2012-03-03 (×3): 100 mg via ORAL
  Filled 2012-02-29 (×3): qty 2

## 2012-02-29 MED ORDER — GABAPENTIN 300 MG PO CAPS
300.0000 mg | ORAL_CAPSULE | Freq: Three times a day (TID) | ORAL | Status: DC
  Administered 2012-03-01 – 2012-03-03 (×8): 300 mg via ORAL
  Filled 2012-02-29 (×11): qty 1

## 2012-02-29 MED ORDER — ONDANSETRON HCL 4 MG/2ML IJ SOLN
4.0000 mg | INTRAMUSCULAR | Status: DC | PRN
  Administered 2012-02-29 – 2012-03-01 (×3): 4 mg via INTRAVENOUS
  Filled 2012-02-29 (×4): qty 2

## 2012-02-29 MED ORDER — PROPOFOL 10 MG/ML IV BOLUS
INTRAVENOUS | Status: DC | PRN
  Administered 2012-02-29: 140 mg via INTRAVENOUS

## 2012-02-29 MED ORDER — ACETAMINOPHEN 325 MG PO TABS
650.0000 mg | ORAL_TABLET | ORAL | Status: DC | PRN
  Administered 2012-03-04: 650 mg via ORAL
  Filled 2012-02-29: qty 2

## 2012-02-29 MED ORDER — ACETAMINOPHEN 650 MG RE SUPP: 650.0000 mg | RECTAL | Status: DC | PRN

## 2012-02-29 MED ORDER — CLONIDINE HCL 0.3 MG PO TABS
0.3000 mg | ORAL_TABLET | Freq: Two times a day (BID) | ORAL | Status: DC
  Administered 2012-03-01 – 2012-03-03 (×5): 0.3 mg via ORAL
  Filled 2012-02-29 (×8): qty 1

## 2012-02-29 MED ORDER — FAMOTIDINE 20 MG PO TABS
20.0000 mg | ORAL_TABLET | Freq: Every day | ORAL | Status: DC
  Administered 2012-03-01 – 2012-03-11 (×11): 20 mg via ORAL
  Filled 2012-02-29 (×11): qty 1

## 2012-02-29 MED ORDER — DOXEPIN HCL 10 MG PO CAPS
30.0000 mg | ORAL_CAPSULE | Freq: Every day | ORAL | Status: DC
  Administered 2012-03-01 – 2012-03-10 (×10): 30 mg via ORAL
  Filled 2012-02-29 (×14): qty 3

## 2012-02-29 MED ORDER — 0.9 % SODIUM CHLORIDE (POUR BTL) OPTIME
TOPICAL | Status: DC | PRN
  Administered 2012-02-29: 1000 mL

## 2012-02-29 MED ORDER — ACETAMINOPHEN 10 MG/ML IV SOLN
1000.0000 mg | Freq: Once | INTRAVENOUS | Status: DC
  Filled 2012-02-29: qty 100

## 2012-02-29 MED ORDER — MIDAZOLAM HCL 5 MG/5ML IJ SOLN
INTRAMUSCULAR | Status: DC | PRN
  Administered 2012-02-29: 2 mg via INTRAVENOUS

## 2012-02-29 MED ORDER — SPIRONOLACTONE 50 MG PO TABS
50.0000 mg | ORAL_TABLET | Freq: Every day | ORAL | Status: DC
  Administered 2012-03-01 – 2012-03-03 (×3): 50 mg via ORAL
  Filled 2012-02-29 (×3): qty 1

## 2012-02-29 MED ORDER — FENTANYL CITRATE 0.05 MG/ML IJ SOLN: 50.0000 ug | Freq: Once | INTRAMUSCULAR | Status: DC

## 2012-02-29 MED ORDER — SODIUM CHLORIDE 0.9 % IV SOLN
INTRAVENOUS | Status: DC | PRN
  Administered 2012-02-29: 12:00:00 via INTRAVENOUS

## 2012-02-29 MED ORDER — ADULT MULTIVITAMIN W/MINERALS CH
1.0000 | ORAL_TABLET | Freq: Every day | ORAL | Status: DC
  Administered 2012-03-01 – 2012-03-11 (×11): 1 via ORAL
  Filled 2012-02-29 (×11): qty 1

## 2012-02-29 MED ORDER — HYDROMORPHONE HCL PF 1 MG/ML IJ SOLN
0.2500 mg | INTRAMUSCULAR | Status: DC | PRN
  Administered 2012-02-29 (×4): 0.5 mg via INTRAVENOUS

## 2012-02-29 MED ORDER — BISACODYL 10 MG RE SUPP
10.0000 mg | Freq: Every day | RECTAL | Status: DC | PRN
  Administered 2012-03-03: 10 mg via RECTAL
  Filled 2012-02-29 (×3): qty 1

## 2012-02-29 MED ORDER — MENTHOL 3 MG MT LOZG: 1.0000 | LOZENGE | OROMUCOSAL | Status: DC | PRN

## 2012-02-29 MED ORDER — SENNA 8.6 MG PO TABS
1.0000 | ORAL_TABLET | Freq: Two times a day (BID) | ORAL | Status: DC
  Administered 2012-03-01 – 2012-03-11 (×21): 8.6 mg via ORAL
  Filled 2012-02-29 (×24): qty 1

## 2012-02-29 MED ORDER — LIDOCAINE HCL (CARDIAC) 20 MG/ML IV SOLN
INTRAVENOUS | Status: DC | PRN
  Administered 2012-02-29: 40 mg via INTRAVENOUS

## 2012-02-29 MED ORDER — ROCURONIUM BROMIDE 100 MG/10ML IV SOLN
INTRAVENOUS | Status: DC | PRN
  Administered 2012-02-29: 50 mg via INTRAVENOUS

## 2012-02-29 MED ORDER — HYDROMORPHONE HCL PF 1 MG/ML IJ SOLN
0.5000 mg | INTRAMUSCULAR | Status: DC | PRN
  Administered 2012-02-29 – 2012-03-01 (×2): 1 mg via INTRAVENOUS
  Administered 2012-03-01: 0.5 mg via INTRAVENOUS
  Administered 2012-03-01: 1 mg via INTRAVENOUS
  Administered 2012-03-01: 0.5 mg via INTRAVENOUS
  Administered 2012-03-02 (×4): 1 mg via INTRAVENOUS
  Filled 2012-02-29 (×9): qty 1

## 2012-02-29 MED ORDER — BACITRACIN 50000 UNITS IM SOLR
INTRAMUSCULAR | Status: AC
  Filled 2012-02-29: qty 1

## 2012-02-29 MED ORDER — SODIUM CHLORIDE 0.9 % IV SOLN
INTRAVENOUS | Status: AC
  Administered 2012-03-01: 1000 mL via INTRAVENOUS
  Filled 2012-02-29: qty 500

## 2012-02-29 MED ORDER — ONDANSETRON HCL 4 MG/2ML IJ SOLN
INTRAMUSCULAR | Status: DC | PRN
  Administered 2012-02-29: 4 mg via INTRAVENOUS

## 2012-02-29 MED ORDER — MIDAZOLAM HCL 2 MG/2ML IJ SOLN: 1.0000 mg | INTRAMUSCULAR | Status: DC | PRN

## 2012-02-29 MED ORDER — FENTANYL CITRATE 0.05 MG/ML IJ SOLN
INTRAMUSCULAR | Status: DC | PRN
  Administered 2012-02-29 (×2): 50 ug via INTRAVENOUS
  Administered 2012-02-29 (×2): 100 ug via INTRAVENOUS
  Administered 2012-02-29 (×4): 50 ug via INTRAVENOUS
  Administered 2012-02-29: 100 ug via INTRAVENOUS
  Administered 2012-02-29 (×3): 50 ug via INTRAVENOUS

## 2012-02-29 MED ORDER — FLEET ENEMA 7-19 GM/118ML RE ENEM: 1.0000 | ENEMA | Freq: Once | RECTAL | Status: AC | PRN

## 2012-02-29 MED ORDER — LEVOTHYROXINE SODIUM 150 MCG PO TABS
150.0000 ug | ORAL_TABLET | ORAL | Status: DC
  Administered 2012-03-01 – 2012-03-11 (×8): 150 ug via ORAL
  Filled 2012-02-29 (×10): qty 1

## 2012-02-29 MED ORDER — ALUM & MAG HYDROXIDE-SIMETH 200-200-20 MG/5ML PO SUSP
30.0000 mL | Freq: Four times a day (QID) | ORAL | Status: DC | PRN
  Administered 2012-03-01: 30 mL via ORAL
  Filled 2012-02-29: qty 30

## 2012-02-29 MED ORDER — OXYCODONE-ACETAMINOPHEN 5-325 MG PO TABS
1.0000 | ORAL_TABLET | ORAL | Status: DC | PRN
  Administered 2012-03-01 – 2012-03-03 (×4): 2 via ORAL
  Filled 2012-02-29 (×3): qty 2

## 2012-02-29 MED ORDER — BUDESONIDE-FORMOTEROL FUMARATE 160-4.5 MCG/ACT IN AERO: 2.0000 | INHALATION_SPRAY | Freq: Two times a day (BID) | RESPIRATORY_TRACT | Status: DC | PRN

## 2012-02-29 MED ORDER — SODIUM CHLORIDE 0.9 % IJ SOLN
3.0000 mL | Freq: Two times a day (BID) | INTRAMUSCULAR | Status: DC
  Administered 2012-03-01 – 2012-03-10 (×18): 3 mL via INTRAVENOUS

## 2012-02-29 MED ORDER — HEPARIN SODIUM (PORCINE) 1000 UNIT/ML IJ SOLN
INTRAMUSCULAR | Status: AC
  Filled 2012-02-29: qty 1

## 2012-02-29 MED ORDER — ARTIFICIAL TEARS OP OINT
TOPICAL_OINTMENT | OPHTHALMIC | Status: DC | PRN
  Administered 2012-02-29: 1 via OPHTHALMIC

## 2012-02-29 SURGICAL SUPPLY — 77 items
ADH SKN CLS APL DERMABOND .7 (GAUZE/BANDAGES/DRESSINGS) ×2
ADH SKN CLS LQ APL DERMABOND (GAUZE/BANDAGES/DRESSINGS) ×1
APL SKNCLS STERI-STRIP NONHPOA (GAUZE/BANDAGES/DRESSINGS) ×1
BAG DECANTER FOR FLEXI CONT (MISCELLANEOUS) ×2 IMPLANT
BENZOIN TINCTURE PRP APPL 2/3 (GAUZE/BANDAGES/DRESSINGS) ×2 IMPLANT
BLADE SURG ROTATE 9660 (MISCELLANEOUS) IMPLANT
BRUSH SCRUB EZ PLAIN DRY (MISCELLANEOUS) ×2 IMPLANT
BUR CUTTER 7.0 ROUND (BURR) ×2 IMPLANT
BUR MATCHSTICK NEURO 3.0 LAGG (BURR) ×2 IMPLANT
CAGE 10X22 (Cage) ×2 IMPLANT
CAGE CAPSTONE BULLET 8X22 (Cage) ×1 IMPLANT
CANISTER SUCTION 2500CC (MISCELLANEOUS) ×2 IMPLANT
CAP LCK SPNE (Orthopedic Implant) ×8 IMPLANT
CAP LOCK SPINE RADIUS (Orthopedic Implant) IMPLANT
CAP LOCKING (Orthopedic Implant) ×16 IMPLANT
CATH FOLEY 2WAY SLVR  5CC 12FR (CATHETERS) ×1
CATH FOLEY 2WAY SLVR 5CC 12FR (CATHETERS) IMPLANT
CLOTH BEACON ORANGE TIMEOUT ST (SAFETY) ×2 IMPLANT
CONT SPEC 4OZ CLIKSEAL STRL BL (MISCELLANEOUS) ×4 IMPLANT
COVER BACK TABLE 24X17X13 BIG (DRAPES) IMPLANT
COVER TABLE BACK 60X90 (DRAPES) ×2 IMPLANT
CROSSLINK MEDIUM (Orthopedic Implant) ×1 IMPLANT
DECANTER SPIKE VIAL GLASS SM (MISCELLANEOUS) ×1 IMPLANT
DERMABOND ADHESIVE PROPEN (GAUZE/BANDAGES/DRESSINGS) ×1
DERMABOND ADVANCED (GAUZE/BANDAGES/DRESSINGS) ×2
DERMABOND ADVANCED .7 DNX12 (GAUZE/BANDAGES/DRESSINGS) ×1 IMPLANT
DERMABOND ADVANCED .7 DNX6 (GAUZE/BANDAGES/DRESSINGS) IMPLANT
DRAPE C-ARM 42X72 X-RAY (DRAPES) ×4 IMPLANT
DRAPE LAPAROTOMY 100X72X124 (DRAPES) ×2 IMPLANT
DRAPE POUCH INSTRU U-SHP 10X18 (DRAPES) ×2 IMPLANT
DRAPE PROXIMA HALF (DRAPES) IMPLANT
DRAPE SURG 17X23 STRL (DRAPES) ×8 IMPLANT
ELECT REM PT RETURN 9FT ADLT (ELECTROSURGICAL) ×2
ELECTRODE REM PT RTRN 9FT ADLT (ELECTROSURGICAL) ×1 IMPLANT
EVACUATOR 1/8 PVC DRAIN (DRAIN) ×2 IMPLANT
GAUZE SPONGE 4X4 16PLY XRAY LF (GAUZE/BANDAGES/DRESSINGS) IMPLANT
GLOVE BIOGEL PI IND STRL 7.0 (GLOVE) IMPLANT
GLOVE BIOGEL PI IND STRL 7.5 (GLOVE) IMPLANT
GLOVE BIOGEL PI INDICATOR 7.0 (GLOVE) ×2
GLOVE BIOGEL PI INDICATOR 7.5 (GLOVE) ×1
GLOVE ECLIPSE 7.5 STRL STRAW (GLOVE) ×6 IMPLANT
GLOVE ECLIPSE 8.5 STRL (GLOVE) ×4 IMPLANT
GLOVE EXAM NITRILE LRG STRL (GLOVE) IMPLANT
GLOVE EXAM NITRILE MD LF STRL (GLOVE) ×1 IMPLANT
GLOVE EXAM NITRILE XL STR (GLOVE) IMPLANT
GLOVE EXAM NITRILE XS STR PU (GLOVE) IMPLANT
GLOVE SS BIOGEL STRL SZ 6.5 (GLOVE) IMPLANT
GLOVE SUPERSENSE BIOGEL SZ 6.5 (GLOVE) ×1
GLOVE SURG SS PI 6.5 STRL IVOR (GLOVE) ×3 IMPLANT
GLOVE SURG SS PI 7.5 STRL IVOR (GLOVE) ×1 IMPLANT
GOWN BRE IMP SLV AUR LG STRL (GOWN DISPOSABLE) ×2 IMPLANT
GOWN BRE IMP SLV AUR XL STRL (GOWN DISPOSABLE) ×5 IMPLANT
GOWN STRL REIN 2XL LVL4 (GOWN DISPOSABLE) IMPLANT
KIT BASIN OR (CUSTOM PROCEDURE TRAY) ×2 IMPLANT
KIT ROOM TURNOVER OR (KITS) ×2 IMPLANT
MILL MEDIUM DISP (BLADE) ×1 IMPLANT
NEEDLE HYPO 22GX1.5 SAFETY (NEEDLE) ×2 IMPLANT
NS IRRIG 1000ML POUR BTL (IV SOLUTION) ×2 IMPLANT
PACK LAMINECTOMY NEURO (CUSTOM PROCEDURE TRAY) ×2 IMPLANT
ROD 90MM RADIUS (Rod) ×2 IMPLANT
SCREW 6.75X40MM (Screw) ×8 IMPLANT
SPONGE GAUZE 4X4 12PLY (GAUZE/BANDAGES/DRESSINGS) ×2 IMPLANT
SPONGE LAP 4X18 X RAY DECT (DISPOSABLE) ×1 IMPLANT
SPONGE SURGIFOAM ABS GEL 100 (HEMOSTASIS) ×2 IMPLANT
STRIP CLOSURE SKIN 1/2X4 (GAUZE/BANDAGES/DRESSINGS) ×3 IMPLANT
SUT VIC AB 0 CT1 18XCR BRD8 (SUTURE) ×2 IMPLANT
SUT VIC AB 0 CT1 8-18 (SUTURE) ×4
SUT VIC AB 2-0 CT1 18 (SUTURE) ×4 IMPLANT
SUT VIC AB 3-0 SH 8-18 (SUTURE) ×4 IMPLANT
SYR 20ML ECCENTRIC (SYRINGE) ×2 IMPLANT
TAPE CLOTH SURG 4X10 WHT LF (GAUZE/BANDAGES/DRESSINGS) ×1 IMPLANT
TOWEL OR 17X24 6PK STRL BLUE (TOWEL DISPOSABLE) ×2 IMPLANT
TOWEL OR 17X26 10 PK STRL BLUE (TOWEL DISPOSABLE) ×2 IMPLANT
TRAY FOLEY CATH 14FRSI W/METER (CATHETERS) ×2 IMPLANT
WATER STERILE IRR 1000ML POUR (IV SOLUTION) ×2 IMPLANT
WEDGE TANGENT 10X26MM ×2 IMPLANT
WEDGE TANGENT 8X26MM ×1 IMPLANT

## 2012-02-29 NOTE — Anesthesia Postprocedure Evaluation (Signed)
Anesthesia Post Note  Patient: Tina Molina  Procedure(s) Performed: Procedure(s) (LRB): POSTERIOR LUMBAR FUSION 3 LEVEL (N/A)  Anesthesia type: General  Patient location: PACU  Post pain: Pain level controlled and Adequate analgesia  Post assessment: Post-op Vital signs reviewed, Patient's Cardiovascular Status Stable, Respiratory Function Stable, Patent Airway and Pain level controlled  Last Vitals:  Filed Vitals:   02/29/12 1841  BP:   Pulse: 113  Temp: 36.8 C  Resp: 26    Post vital signs: Reviewed and stable  Level of consciousness: awake, alert  and oriented  Complications: No apparent anesthesia complications

## 2012-02-29 NOTE — Preoperative (Signed)
Beta Blockers   Reason not to administer Beta Blockers:Not Applicable, pt took coreg 10/15

## 2012-02-29 NOTE — Transfer of Care (Signed)
Immediate Anesthesia Transfer of Care Note  Patient: Tina Molina  Procedure(s) Performed: Procedure(s) (LRB) with comments: POSTERIOR LUMBAR FUSION 3 LEVEL (N/A) - Lumbar two-three, lumbar three-four, lumbar four-five decompression, lumbar laminectomy/posterior lumbar interbody fusion with tangent, telemon cage, posterior lateral arthrodesis with pedicle screws    Patient Location: PACU  Anesthesia Type: General  Level of Consciousness: awake, alert  and oriented  Airway & Oxygen Therapy: Patient Spontanous Breathing and Patient connected to nasal cannula oxygen  Post-op Assessment: Report given to PACU RN and Post -op Vital signs reviewed and stable  Post vital signs: Reviewed and stable  Complications: No apparent anesthesia complications

## 2012-02-29 NOTE — Anesthesia Preprocedure Evaluation (Signed)
Anesthesia Evaluation  Patient identified by MRN, date of birth, ID band Patient awake    Reviewed: Allergy & Precautions, H&P , NPO status , Patient's Chart, lab work & pertinent test results  Airway Mallampati: II TM Distance: >3 FB Neck ROM: Full    Dental   Pulmonary shortness of breath,  sarcoidosis + rhonchi   + wheezing      Cardiovascular hypertension, Rhythm:Regular Rate:Normal     Neuro/Psych  Neuromuscular disease    GI/Hepatic GERD-  ,  Endo/Other  Hypothyroidism   Renal/GU Renal InsufficiencyRenal disease     Musculoskeletal   Abdominal (+) + obese,   Peds  Hematology   Anesthesia Other Findings   Reproductive/Obstetrics                           Anesthesia Physical Anesthesia Plan  ASA: III  Anesthesia Plan: General   Post-op Pain Management:    Induction: Intravenous  Airway Management Planned: Oral ETT  Additional Equipment:   Intra-op Plan:   Post-operative Plan: Extubation in OR  Informed Consent: I have reviewed the patients History and Physical, chart, labs and discussed the procedure including the risks, benefits and alternatives for the proposed anesthesia with the patient or authorized representative who has indicated his/her understanding and acceptance.     Plan Discussed with: CRNA and Surgeon  Anesthesia Plan Comments:         Anesthesia Quick Evaluation

## 2012-02-29 NOTE — Op Note (Signed)
Date of procedure: 02/29/2012  Date of dictation: Same  Service: Neurosurgery  Preoperative diagnosis: L2-3, L3-4, L4-5 stenosis with neurogenic claudication, status post previous L3-4 and L4-5 laminectomies.  Postoperative diagnosis: Same  Procedure Name: L2-3 decompressive laminectomy with bilateral L2 and L3 decompressive foraminotomies, more than would be required for simple interbody fusion alone.  L3-4 and L4-5 redo decompressive laminectomies with bilateral L3-L4 and L5 decompressive foraminotomies, more than would be required for simple interbody  fusion alone.  L2-3, L3-4, L4-5 posterior lumbar interbody fusion utilizing tangent interbody allograft wedge Telamon interbody peek cage and local autograft.   L2-3-4 5 posterior lateral arthrodesis utilizing segmental pedicle screw station and local autograft.  Surgeon:Seydina Holliman A.Bethaney Oshana, M.D.  Asst. Surgeon: Hal Neer  Anesthesia: General  Indication: 66 year old female with intractable back and bilateral lower chamois symptoms. Workup demonstrates evidence of transitional anatomy at the lumbosacral junction with marked disc space disease and dynamic instability with severe stenosis at L2-3 L3-4 and bilaterally at L4-5. Patient presents now for three-level decompression infusion in hopes of improving her symptoms.on:    Operative note:After induction of anesthesia, patient positioned prone onto Wilson frame. Lumbar region prepped and draped. Incision made subperiosteal dissection then performed exposing the lamina facet joints and transverse processes of L2 L3-L4 and L5. Deep retractor placed intraoperative fluoroscopy used levels were confirmed. Decompressive laminectomies including redo laminectomies L3-4 and L4 fiber then performed using Leksell rongeurs Kerrison or his high-speed drill to remove the entire lamina of L2 L3-L4 and the superior aspect of L5. Complete inferior facetectomies of L2 to L3 and L4 as well as superior facetectomies  of L3-L4 and L5 were performed bilaterally. All bone is cleaned in use and later autograft. Ligament flavum and epidural scar were elevated and resected so fashion. Wide decompressive foraminotomies were performed on of course exiting L2 L3-L4 and L5 nerve root. Bilateral discectomies were then performed at L2-3 L3-4 L4-5. The spaces then distracted. Starting first at L2-3 and a millimeters distractors left the patient's left side. Thecal sac or respect on the right side. The spaces and reamed and cut with a millimeters tangent introducer soft tissues the interspace. 8 x 22 mm Telamon cage packed with morselized autograft was impacted into place and recessed roughly 1 mm from the posterior bur or margin of L2. Distractors in patient's left side. Thecal sac and nerve respect and on the left side. The space was again reamed and then cut with a millimeters tangent his fingers soft tissues interspace. The spaces further curettage. Morselize autograft packed interspace for later fusion. 8 x 2600 tangent wedges and packed into place and recessed roughly 1 mm from the posterior bur or margin of L2. Procedure then repeated at L3-4 L4-5 utilizing 10 mm insert implants and local autograft. Pedicles of L2-3-4 and 5 were then identified using surface landmarks and intraoperative fluoroscopy 2 partial bone overlying the pedicles and removing high-speed drill pedicles then probed using pedicle awl each pedicle awl track was then tapped with a 5.2 fiber screw temperature Olson probed and found to be solidly within bone. 6.75 x 40 mm radius screws placed bilaterally at L2 L3-L4 and L5. Transverse processes of L2-3-4 and 5 then decorticated. Morselize autograft packed posterior laterally. Short segment titanium rods and posterior the screw heads from L2-L5. Locking caps and blisters. Locking caps and engaged with the construct under compression. Transverse connector was placed. Gelfoam was placed topically hemostasis. Intraoperative  images real good position the varus or proper level with normal and spine wounds that  area one final time and then closed in a typical fashion. Steri-Strips triggers were applied. There were no apparent locations. Patient did well and she returns to the recovery room postop.

## 2012-02-29 NOTE — H&P (Signed)
Tina Molina is an 66 y.o. female.   Chief Complaint: Back and bilateral leg pain HPI: 66 year old female with severe back and bilateral lower trimming a pain failing conservative management and consistent with neurogenic claudication presents for three-level lumbar decompression and fusion with instrumentation. Patient with mild right lower chamois weakness but no other areas weakness. Diffuse bilateral lower trimming sensory loss. No bowel or bladder dysfunction. Previous history of L4-5 decompression on the right.  Past Medical History  Diagnosis Date  . Pneumonia 04/2010    OP  . Anemia     Associated with leukopenia and lymphocytosis. This is been evaluated by Dr. Marin Olp . Her platelet count has been normal  . Hypertension   . Thyroid disease   . Chronic back pain   . Sarcoidosis   . Lymphocytosis   . GERD (gastroesophageal reflux disease)   . Chronic kidney disease   . Renal insufficiency   . Arthritis     Past Surgical History  Procedure Date  . Cholecystectomy   . Carpal tunnel release     Bilateral   . Cervical discectomy 06/2010    Dr.Kelsie Kramp  . Thyroidectomy 2003    For goiter  . Colonoscopy 2010    Family History  Problem Relation Age of Onset  . Anemia Father   . Arthritis Father   . Heart failure Father 53    No CAD  . Diabetes Mother   . Arthritis Mother   . Glaucoma Mother   . Heart disease Mother 42    No CAD  . Hypertension Brother   . Anemia Brother   . Kidney disease Brother   . Hypertension Sister   . Kidney disease Sister   . Anemia Sister   . Diabetes Maternal Aunt   . Arthritis Paternal Grandmother    Social History:  reports that she quit smoking about 14 years ago. Her smoking use included Cigarettes. She has never used smokeless tobacco. She reports that she does not drink alcohol or use illicit drugs.  Allergies:  Allergies  Allergen Reactions  . Penicillins     Intolerance or allergy not remembered; her sister stated  penicillin caused "boils under the skin"  . Shellfish Allergy Rash  . Sulfonamide Derivatives     Arthralgias; her sister stated that this caused a rash.  . Codeine     Nausea only with liquid codeine    Medications Prior to Admission  Medication Sig Dispense Refill  . aspirin 81 MG tablet Take 81 mg by mouth daily.       . budesonide-formoterol (SYMBICORT) 160-4.5 MCG/ACT inhaler Inhale 2 puffs into the lungs 2 (two) times daily as needed. For wheezing      . carvedilol (COREG) 25 MG tablet Take 1 tablet (25 mg total) by mouth 2 (two) times daily.  60 tablet  6  . Cholecalciferol (VITAMIN D3) 1000 UNITS CAPS Take 1,000 Units by mouth daily.       . cloNIDine (CATAPRES) 0.1 MG tablet Take 3 tablets (0.3 mg total) by mouth 2 (two) times daily.  180 tablet  5  . Cyanocobalamin (B-12 SL) Place 1 tablet under the tongue daily.       . cyclobenzaprine (FLEXERIL) 10 MG tablet Take 10 mg by mouth every 8 (eight) hours as needed. For muscle spasms      . diclofenac sodium (VOLTAREN) 1 % GEL Apply 4 g topically 4 (four) times daily. To knee for pain      .  doxepin (SINEQUAN) 10 MG capsule Take 30 mg by mouth at bedtime.       . fluocinonide ointment (LIDEX) 0.05 % USE EVERY OTHER DAY ON SCALP, NOT ON FACE  60 g  2  . gabapentin (NEURONTIN) 300 MG capsule Take 300 mg by mouth every 8 (eight) hours.      Marland Kitchen HYDROcodone-acetaminophen (NORCO) 5-325 MG per tablet Take 1-2 tablets by mouth every 6 (six) hours as needed. For pain      . hydrocortisone 2.5 % cream APPLY TO AFFECTED AREA ONE TIME PER DAY AROUND MOUTH FOR SCALING AND DRYNESS  28 g  1  . levothyroxine (SYNTHROID, LEVOTHROID) 150 MCG tablet Take 75-150 mcg by mouth daily. Takes 1/2 tablet on Tuesday and Thursday      . losartan (COZAAR) 100 MG tablet Take 100 mg by mouth daily.      . Multiple Vitamin (MULTIVITAMIN WITH MINERALS) TABS Take 1 tablet by mouth daily.      Marland Kitchen spironolactone (ALDACTONE) 50 MG tablet Take 1 tablet (50 mg total) by  mouth daily.  30 tablet  11  . vitamin E 400 UNIT capsule Take 400 Units by mouth daily.       . ranitidine (ZANTAC) 150 MG tablet Take 150 mg by mouth 2 (two) times daily.        No results found for this or any previous visit (from the past 48 hour(s)). No results found.  Review of Systems  Constitutional: Negative.   HENT: Negative.   Eyes: Negative.   Respiratory: Negative.   Cardiovascular: Negative.   Gastrointestinal: Negative.   Genitourinary: Negative.   Musculoskeletal: Negative.   Skin: Negative.   Neurological: Negative.   Endo/Heme/Allergies: Negative.   Psychiatric/Behavioral: Negative.     Blood pressure 154/92, pulse 103, temperature 98.1 F (36.7 C), temperature source Oral, resp. rate 18, SpO2 100.00%. Physical Exam  Constitutional: She is oriented to person, place, and time. She appears well-developed and well-nourished.  HENT:  Head: Normocephalic and atraumatic.  Right Ear: External ear normal.  Left Ear: External ear normal.  Nose: Nose normal.  Mouth/Throat: Oropharynx is clear and moist.  Eyes: Conjunctivae normal and EOM are normal. Pupils are equal, round, and reactive to light.  Neck: Normal range of motion. Neck supple. No tracheal deviation present. No thyromegaly present.  Cardiovascular: Normal rate, regular rhythm, normal heart sounds and intact distal pulses.  Exam reveals no friction rub.   No murmur heard. Respiratory: Effort normal and breath sounds normal. No respiratory distress. She has no wheezes.  GI: Soft. Bowel sounds are normal. She exhibits no distension. There is no tenderness.  Musculoskeletal: Normal range of motion. She exhibits no edema and no tenderness.  Neurological: She is alert and oriented to person, place, and time. She has normal reflexes. No cranial nerve deficit. Coordination normal.  Skin: Skin is warm and dry. No rash noted. No erythema. No pallor.  Psychiatric: She has a normal mood and affect. Her behavior is  normal. Judgment and thought content normal.     Assessment/Plan L2-3, L3-4 L4-5 stenosis with neurogenic claudication. Plan L2-3 L3-4 L4-5 decompressive laminectomy with bilateral L2 L3-L4 and L5 decompressive foraminotomies followed by posterior lumbar my fusion utilizing tangent interbody allograft wedge Telamon a right peek cage and local autographing at all 3 levels. L2-L5 posterior lateral arthrodesis utilizing segmental pedicle screw sedation and local autograft. Risks and benefits been explained. Patient wishes to proceed.  Tina Molina A 02/29/2012, 11:31 AM

## 2012-02-29 NOTE — Brief Op Note (Signed)
02/29/2012  6:27 PM  PATIENT:  Elnita Maxwell Gopal-Smoot  66 y.o. female  PRE-OPERATIVE DIAGNOSIS:  Degenerative disc disease/stenosis  POST-OPERATIVE DIAGNOSIS:  Degenerative disc disease/stenosis  PROCEDURE:  Procedure(s) (LRB) with comments: POSTERIOR LUMBAR FUSION 3 LEVEL (N/A) - Lumbar two-three, lumbar three-four, lumbar four-five decompression, lumbar laminectomy/posterior lumbar interbody fusion with tangent, telemon cage, posterior lateral arthrodesis with pedicle screws    SURGEON:  Surgeon(s) and Role:    * Charlie Pitter, MD - Primary    * Faythe Ghee, MD - Assisting  PHYSICIAN ASSISTANT:   ASSISTANTS:    ANESTHESIA:   general  EBL:  Total I/O In: 2815 [I.V.:2200; Blood:115; IV Piggyback:500] Out: 975 [Urine:475; Blood:500]  BLOOD ADMINISTERED:none  DRAINS: (Medium) Hemovact drain(s) in the Epidural space with  Suction Open   LOCAL MEDICATIONS USED:  MARCAINE     SPECIMEN:  No Specimen  DISPOSITION OF SPECIMEN:  N/A  COUNTS:  YES  TOURNIQUET:  * No tourniquets in log *  DICTATION: .Dragon Dictation  PLAN OF CARE: Admit to inpatient   PATIENT DISPOSITION:  PACU - hemodynamically stable.   Delay start of Pharmacological VTE agent (>24hrs) due to surgical blood loss or risk of bleeding: yes

## 2012-03-01 LAB — CBC
HCT: 22.3 % — ABNORMAL LOW (ref 36.0–46.0)
Hemoglobin: 7.6 g/dL — ABNORMAL LOW (ref 12.0–15.0)
MCH: 32.5 pg (ref 26.0–34.0)
MCHC: 34.1 g/dL (ref 30.0–36.0)
MCV: 95.3 fL (ref 78.0–100.0)
Platelets: 218 10*3/uL (ref 150–400)
RBC: 2.34 MIL/uL — ABNORMAL LOW (ref 3.87–5.11)
RDW: 13 % (ref 11.5–15.5)
WBC: 13.1 10*3/uL — ABNORMAL HIGH (ref 4.0–10.5)

## 2012-03-01 LAB — PREPARE RBC (CROSSMATCH)

## 2012-03-01 MED ORDER — PROMETHAZINE HCL 25 MG/ML IJ SOLN
12.5000 mg | INTRAMUSCULAR | Status: DC | PRN
  Administered 2012-03-01: 12.5 mg via INTRAMUSCULAR
  Filled 2012-03-01 (×2): qty 1

## 2012-03-01 NOTE — Evaluation (Signed)
Physical Therapy Evaluation Patient Details Name: Tina Molina MRN: [id number] DOB: 13-Aug-1945 Today's Date: 03/01/2012 Time: [id number] PT Time Calculation (min): 35 min  PT Assessment / Plan / Recommendation Clinical Impression  66 yo s/p L2-5 decompressive laminectomy and fusion resulting in the problems listed below. Will benefit from PT to incr independence while adhering to back precautions.    PT Assessment  Patient needs continued PT services    Follow Up Recommendations  Home health PT;Supervision/Assistance - 24 hour    Does the patient have the potential to tolerate intense rehabilitation      Barriers to Discharge None      Equipment Recommendations  Rolling walker with 5" wheels    Recommendations for Other Services     Frequency Min 5X/week    Precautions / Restrictions Precautions Precautions: Back Precaution Booklet Issued: No Precaution Comments: Pt instructed in back precautions; will need to provide a handout Required Braces or Orthoses: Spinal Brace Spinal Brace: Lumbar corset;Applied in sitting position   Pertinent Vitals/Pain 5/10 back pain pre- and post-activity; repositioned      Mobility  Bed Mobility Bed Mobility: Sit to Sidelying Left Sit to Sidelying Left: 4: Min assist;HOB flat Details for Bed Mobility Assistance: assist to raise legs onto bed; vc for technique and to maintain back precautions Transfers Transfers: Sit to Stand;Stand to Sit Sit to Stand: 4: Min assist;With upper extremity assist;With armrests;From chair/3-in-1 Stand to Sit: 4: Min guard;With upper extremity assist;With armrests;To bed;To chair/3-in-1 Details for Transfer Assistance: x2 (became hot, faint feeling on first attempt and returnd to sitting in chair); required stabilizing as she transitioned hands from chair to RW Ambulation/Gait Ambulation/Gait Assistance: 4: Min assist Ambulation Distance (Feet): 4 Feet Assistive device: Rolling  walker Ambulation/Gait Assistance Details: limited distance due to nausea and feeling hot/faint Gait Pattern: Step-to pattern;Decreased stride length    Shoulder Instructions     Exercises     PT Diagnosis: Difficulty walking;Acute pain  PT Problem List: Decreased strength;Decreased activity tolerance;Decreased mobility;Decreased knowledge of use of DME;Decreased knowledge of precautions;Pain PT Treatment Interventions: DME instruction;Gait training;Functional mobility training;Therapeutic activities;Patient/family education   PT Goals Acute Rehab PT Goals PT Goal Formulation: With patient Time For Goal Achievement: 03/06/12 Potential to Achieve Goals: Good Pt will Roll Supine to Left Side: with modified independence PT Goal: Rolling Supine to Left Side - Progress: Goal set today Pt will go Supine/Side to Sit: with modified independence;with HOB 0 degrees PT Goal: Supine/Side to Sit - Progress: Goal set today Pt will go Sit to Supine/Side: with modified independence;with HOB 0 degrees PT Goal: Sit to Supine/Side - Progress: Goal set today Pt will go Sit to Stand: with modified independence;with upper extremity assist PT Goal: Sit to Stand - Progress: Goal set today Pt will go Stand to Sit: with modified independence;with upper extremity assist PT Goal: Stand to Sit - Progress: Goal set today Pt will Ambulate: 51 - 150 feet;with supervision;with least restrictive assistive device PT Goal: Ambulate - Progress: Goal set today Additional Goals Additional Goal #1: Pt will verbalize and adhere to back precautions during mobility. PT Goal: Additional Goal #1 - Progress: Goal set today  Visit Information  Last PT Received On: 03/01/12 Assistance Needed: +1    Subjective Data  Subjective: Reports she was nauseous all night and got up to chair about 2:30 a.m. Has been sitting all night Patient Stated Goal: decr pain and incr activity   Prior Functioning  Home Living Lives With:  Alone Available Help at  Discharge: Family;Available 24 hours/day (sister--has had THR, but off the walker) Type of Home: House Home Access: Ramped entrance Home Layout: One level Bathroom Shower/Tub: Tub/shower unit;Curtain Biochemist, clinical: Handicapped height Bathroom Accessibility: Yes How Accessible: Accessible via walker (sideways) Home Adaptive Equipment: None Prior Function Level of Independence: Needs assistance Needs Assistance:  (grocery shopping) Able to Take Stairs?: Yes Driving: Yes Vocation: Retired Corporate investment banker: No difficulties Dominant Hand: Right    Cognition  Overall Cognitive Status: Appears within functional limits for tasks assessed/performed Arousal/Alertness: Awake/alert Orientation Level: Oriented X4 / Intact Behavior During Session: WFL for tasks performed    Extremity/Trunk Assessment Right Lower Extremity Assessment RLE ROM/Strength/Tone: Deficits;Due to pain (due to torn meniscus) RLE ROM/Strength/Tone Deficits: AROM WFL; knee extension 3+/5 (limited by pain), ankle dorsiflexion 5/5 RLE Sensation: WFL - Light Touch RLE Coordination: WFL - gross motor Left Lower Extremity Assessment LLE ROM/Strength/Tone: Deficits;Due to pain LLE ROM/Strength/Tone Deficits: AROM WFL; knee extension functional for sit to stand, 4/5 with testing (limited effort due to pain), ankle DF 5/5 LLE Sensation: WFL - Light Touch LLE Coordination: WFL - gross motor Trunk Assessment Trunk Assessment: Normal   Balance    End of Session PT - End of Session Equipment Utilized During Treatment: Gait belt;Back brace Activity Tolerance: Other (comment) (limited by nausea and faint feeling) Patient left: in bed;with call bell/phone within reach;with family/visitor present Nurse Communication: Mobility status;Other (comment) (SCD's not working)  GP     Rozlyn Yerby 03/01/2012, 9:51 AM  Pager [phone]

## 2012-03-01 NOTE — Progress Notes (Signed)
Orthopedic Tech Progress Note Patient Details:  Tina Molina [birth year] [id number]  Patient ID: Tina Molina, female   DOB: Dec 30, 1945, 66 y.o.   MRN: [id number]   Irish Elders 03/01/2012, 8:57 AM [facility]EN LUMBAR FUSION BRACE COMPLETED BY B[facility]-TECH

## 2012-03-01 NOTE — Progress Notes (Signed)
03/01/2012 3:43 PM Pt has not voided since foley catheter removal at 0640 this morning. Pt denies urge to void.  Pt made one attempt to void on toilet and was not able to.  Bladder scan performed - 335ml urine in bladder. Will plan to have pt attempt to void after dinner if pt does not feel the urge before then.  Will bladder scan again if attempt unsuccessful and I&O cath if indicated. Kayshaun Polanco, Luciano Cutter , RN

## 2012-03-01 NOTE — Progress Notes (Signed)
Occupational Therapy Evaluation Patient Details Name: Tina Molina MRN: [id number] DOB: 02-18-1946 Today's Date: 03/01/2012 Time: [id number] OT Time Calculation (min): 30 min  OT Assessment / Plan / Recommendation Clinical Impression  Pt. 66 yo female s/p lumbar fusion. Session limited due to pt. feeling fatigued and nauseated. Able to complete bed mobility and ADL tasks at EOB. Could benefit from OT acutely to increase mobility and independence with ADLs.    OT Assessment  Patient needs continued OT Services    Follow Up Recommendations  Supervision/Assistance - 24 hour    Barriers to Discharge      Equipment Recommendations  Rolling walker with 5" wheels    Recommendations for Other Services    Frequency  Min 2X/week    Precautions / Restrictions Precautions Precautions: Back Precaution Booklet Issued: Yes (comment) Precaution Comments: Pt. able to reccall 2/3 precautions prior to re-education  Required Braces or Orthoses: Spinal Brace Spinal Brace: Lumbar corset;Applied in sitting position   Pertinent Vitals/Pain Pt reports feeling nauseated     ADL  Grooming: Wash/dry face;Wash/dry hands;Performed;Set up Where Assessed - Grooming: Unsupported sitting Upper Body Bathing: Performed;Set up Where Assessed - Upper Body Bathing: Unsupported sitting Lower Body Bathing: Performed;Supervision/safety Where Assessed - Lower Body Bathing: Supported sit to stand Lower Body Dressing: Performed;Minimal assistance Where Assessed - Lower Body Dressing: Supported standing Toilet Transfer: Simulated;Minimal assistance Toilet Transfer Method: Sit to Loss adjuster, chartered: Raised toilet seat without arms Toileting - Clothing Manipulation and Hygiene: Performed;Supervision/safety Where Assessed - Best boy and Hygiene: Sit to stand from 3-in-1 or toilet Equipment Used: Gait belt;Rolling walker Transfers/Ambulation Related to ADLs: Pt. required  min (A) for sit to stand with vc's for safe hand placement, tends to pull on RW. Pt. did not want to ambulate to sink due to nausea.  ADL Comments: Pt. educated on back precautions and given handout. Educated pt. on use of reacher to (A) with bathing and dressing of the LB. Pt. was min (A) using reacher for LB dressing, requiring help to put feet through holes of pants.      OT Diagnosis: Generalized weakness;Acute pain  OT Problem List: Decreased activity tolerance;Decreased safety awareness;Decreased knowledge of use of DME or AE;Decreased knowledge of precautions;Pain OT Treatment Interventions: Self-care/ADL training;Therapeutic exercise;DME and/or AE instruction;Therapeutic activities;Patient/family education   OT Goals Acute Rehab OT Goals OT Goal Formulation: With patient Time For Goal Achievement: 03/15/12 Potential to Achieve Goals: Good ADL Goals Pt Will Perform Grooming: with supervision;Standing at sink;Unsupported ADL Goal: Grooming - Progress: Goal set today Pt Will Perform Lower Body Dressing: with modified independence;Sit to stand from chair;with adaptive equipment ADL Goal: Lower Body Dressing - Progress: Goal set today Pt Will Transfer to Toilet: with supervision;Ambulation;Regular height toilet ADL Goal: Toilet Transfer - Progress: Goal set today Pt Will Perform Toileting - Clothing Manipulation: with modified independence;Standing ADL Goal: Toileting - Clothing Manipulation - Progress: Goal set today Pt Will Perform Toileting - Hygiene: with supervision;Sit to stand from 3-in-1/toilet ADL Goal: Toileting - Hygiene - Progress: Goal set today Miscellaneous OT Goals Miscellaneous OT Goal #1: Pt. will be mod independent with bed mobility as precursor to ADL's OT Goal: Miscellaneous Goal #1 - Progress: Goal set today  Visit Information  Last OT Received On: 03/01/12 Assistance Needed: +1    Subjective Data  Subjective: I have been feeling very nauseated and tired this  morning.  Patient Stated Goal: To feel better and go home.    Prior Functioning  Home Living Lives With: Alone Available Help at Discharge: Family;Available 24 hours/day;Other (Comment) (sister has had THR 1 yr ago) Type of Home: House Home Access: Ramped entrance Home Layout: One level Bathroom Shower/Tub: Tub/shower unit;Curtain Biochemist, clinical: Handicapped height Bathroom Accessibility: Yes How Accessible: Accessible via walker Home Adaptive Equipment: None Prior Function Level of Independence: Needs assistance Needs Assistance:  (gorcery shopping) Able to Take Stairs?: Yes Driving: Yes Vocation: Retired Corporate investment banker: No difficulties Dominant Hand: Right         Vision/Perception     Cognition  Overall Cognitive Status: Appears within functional limits for tasks assessed/performed Arousal/Alertness: Awake/alert Orientation Level: Oriented X4 / Intact Behavior During Session: WFL for tasks performed    Extremity/Trunk Assessment Right Upper Extremity Assessment RUE ROM/Strength/Tone: [facility] for tasks assessed Left Upper Extremity Assessment LUE ROM/Strength/Tone: WFL for tasks assessed Right Lower Extremity Assessment RLE ROM/Strength/Tone: Deficits;Due to pain (due to torn meniscus) RLE ROM/Strength/Tone Deficits: AROM WFL; knee extension 3+/5 (limited by pain), ankle dorsiflexion 5/5 RLE Sensation: WFL - Light Touch RLE Coordination: WFL - gross motor Left Lower Extremity Assessment LLE ROM/Strength/Tone: Deficits;Due to pain LLE ROM/Strength/Tone Deficits: AROM WFL; knee extension functional for sit to stand, 4/5 with testing (limited effort due to pain), ankle DF 5/5 LLE Sensation: WFL - Light Touch LLE Coordination: WFL - gross motor Trunk Assessment Trunk Assessment: Normal     Mobility Bed Mobility Bed Mobility: Left Sidelying to Sit;Sit to Sidelying Left Left Sidelying to Sit: 4: Min assist;HOB flat Sit to Sidelying Left: 4: Min  assist;HOB flat Details for Bed Mobility Assistance: (A) with supporting LE's onto bed.   Transfers Transfers: Sit to Stand;Stand to Sit Sit to Stand: 4: Min assist;With upper extremity assist;From bed Stand to Sit: 4: Min guard;With upper extremity assist;To bed Details for Transfer Assistance: Pt. reports feeling faint and nauseated during sit<>stand. Once seated pt reported she was hot and not feeling well and wanted to lie back down in bed.                     End of Session OT - End of Session Equipment Utilized During Treatment: Gait belt Activity Tolerance: Patient limited by fatigue;Other (comment) (pt reported feeling nauseated) Patient left: in bed;with call bell/phone within reach Nurse Communication: Precautions  GO     Sharyn Creamer 03/01/2012, 11:30 AM Maurie Boettcher, OTR/L  [phone] 03/03/2012

## 2012-03-01 NOTE — Progress Notes (Signed)
Complains of moderate back pain well controlled with meds. Has some crampiness in her legs but no lower extremity pain.  She is afebrile. She is mildly tachycardic. Urine output is good. Chest and abdomen are benign. She is awake and alert she is oriented and appropriate. Motor and sensory function are intact. Her dressing is dry. Drain output is moderate.  Progressing well following three-level decompression and fusion. We'll mobilize. Check followup CBC today.

## 2012-03-01 NOTE — Progress Notes (Signed)
03/01/2012 11:47 AM MD made aware of CBC results. No new orders at this time. Elvis Boot, Luciano Cutter, RN

## 2012-03-02 LAB — CBC
HCT: 25.4 % — ABNORMAL LOW (ref 36.0–46.0)
Hemoglobin: 8.7 g/dL — ABNORMAL LOW (ref 12.0–15.0)
MCH: 31.9 pg (ref 26.0–34.0)
MCHC: 34.3 g/dL (ref 30.0–36.0)
MCV: 93 fL (ref 78.0–100.0)
Platelets: 138 10*3/uL — ABNORMAL LOW (ref 150–400)
RBC: 2.73 MIL/uL — ABNORMAL LOW (ref 3.87–5.11)
RDW: 14.8 % (ref 11.5–15.5)
WBC: 6.2 10*3/uL (ref 4.0–10.5)

## 2012-03-02 MED ORDER — DICLOFENAC SODIUM 1 % TD GEL
2.0000 g | Freq: Every day | TRANSDERMAL | Status: DC | PRN
  Filled 2012-03-02: qty 100

## 2012-03-02 MED FILL — Sodium Chloride IV Soln 0.9%: INTRAVENOUS | Qty: 1000 | Status: AC

## 2012-03-02 MED FILL — Sodium Chloride Irrigation Soln 0.9%: Qty: 3000 | Status: AC

## 2012-03-02 MED FILL — Heparin Sodium (Porcine) Inj 1000 Unit/ML: INTRAMUSCULAR | Qty: 30 | Status: AC

## 2012-03-02 NOTE — Progress Notes (Signed)
Occupational Therapy Treatment Patient Details Name: Tina Molina MRN: [id number] DOB: Oct 13, 1945 Today's Date: 03/02/2012 Time: 0820-0900 OT Time Calculation (min): 40 min  OT Assessment / Plan / Recommendation Comments on Treatment Session This 67 yo making progress towards goals. Will continue to benefit from acute OT with follow up [place] and 24 S/A prn.    Follow Up Recommendations  Home health OT;Supervision/Assistance - 24 hour       Equipment Recommendations  Rolling walker with 5" wheels;3 in 1 bedside comode       Frequency Min 2X/week   Plan Discharge plan needs to be updated    Precautions / Restrictions Precautions Precautions: Back Precaution Comments: Pt able to recall 2/3 precautions (bend and arch), verbal clue to remember twist Required Braces or Orthoses: Spinal Brace Spinal Brace: Applied in sitting position Restrictions Weight Bearing Restrictions: No   Pertinent Vitals/Pain 4/10 at rest (Bil groin), 6/10 with ambulation (Bil groin). Per pt she had been pre-medicated    ADL  Grooming: Performed;Teeth care;Min guard Where Assessed - Grooming: Unsupported standing Lower Body Dressing: Performed;Min guard (with AE) Where Assessed - Lower Body Dressing: Supported sit to Lobbyist: Pharmacologist Method: Sit to Loss adjuster, chartered:  (from recliner, to sink in bathroom, to door, back to recline) Equipment Used: Back brace;Rolling walker;Gait belt Transfers/Ambulation Related to ADLs: Min A for all ADL Comments: Pt practiced with AE for socks and underwear, made her aware she can purchase the AE kit in the gift shop if desired. States her sister has a tub transfer bench she can borrow.      OT Goals ADL Goals ADL Goal: Grooming - Progress: Progressing toward goals ADL Goal: Lower Body Dressing - Progress: Progressing toward goals ADL Goal: Toilet Transfer - Progress: Progressing toward  goals  Visit Information  Last OT Received On: 03/02/12 Assistance Needed: +1    Subjective Data  Subjective: I was able to keep it down today (breakfast)      Cognition  Overall Cognitive Status: Appears within functional limits for tasks assessed/performed Arousal/Alertness: Awake/alert Orientation Level: Appears intact for tasks assessed Behavior During Session: [facility] for tasks performed    Mobility  Shoulder Instructions Transfers Transfers: Sit to Stand;Stand to Sit Sit to Stand: 4: Min assist;With upper extremity assist;With armrests;From chair/3-in-1 Stand to Sit: With upper extremity assist;With armrests;To chair/3-in-1             End of Session OT - End of Session Equipment Utilized During Treatment: Gait belt;Back brace Activity Tolerance: Patient limited by pain Patient left: in chair;with call bell/phone within reach;with family/visitor present (sister in law)       Almon Register [id number] 03/02/2012, 9:11 AM

## 2012-03-02 NOTE — Care Management Note (Unsigned)
    Page 1 of 2   03/03/2012     11:40:56 AM   CARE MANAGEMENT NOTE 03/03/2012  Patient:  Tina Molina, Tina Molina   Account Number:  [id number]  Date Initiated:  03/02/2012  Documentation initiated by:  Tomi Bamberger  Subjective/Objective Assessment:   dx lumber fusion  admit- lives with her sister, who had surgery last Feb.     Action/Plan:   pt/ot  eval- rec hhpt and hhot and rolling walker.   Anticipated DC Date:  03/03/2012   Anticipated DC Plan:  [place]  CM consult      [facility] Choice  HOME HEALTH   Choice offered to / List presented to:  C-1 Patient   DME arranged  [facility] [facility]      DME agency  [place] arranged  HH-2 PT  HH-3 OT      Status of service:  In process, will continue to follow Medicare Important Message given?   (If response is "NO", the following Medicare IM given date fields will be blank) Date Medicare IM given:   Date Additional Medicare IM given:    Discharge Disposition:    Per UR Regulation:  Reviewed for med. necessity/level of care/duration of stay  If discussed at [place] of Stay Meetings, dates discussed:    Comments:  03/03/12 11:36 Tomi Bamberger RN, BSN [phone] patient worked with ot today and did not progress very well per ot, they are rec snf, or 24 hr supervision.  CSW referral.   03/02/12 14:42 Tomi Bamberger RN, BSN [phone] patient lives with her sister for right now who has had surgery back in Feb.  Patient has medication coverage and she has transportation at [employer].  Per physical therapy, patient will need hhpt/ot,  patient would like to speak with her sister to see what [facility] agency she recs for her [facility] services and she will let me know tomorrow, However she would like to get her DME with [facility] , I explained to her that she will be responsible for the tub shower bench because insurance does not cover that, she states ok.  NCM  will continue to follow for dc needs.

## 2012-03-02 NOTE — Progress Notes (Signed)
Physical Therapy Treatment Patient Details Name: Tina Molina MRN: [id number] DOB: [birth year] Today's Date: 03/02/2012 Time: [id number] PT Time Calculation (min): 26 min  PT Assessment / Plan / Recommendation Comments on Treatment Session  Patient progressing well with ambulation this session. Patients biggest concern is getting in and out of bed. Patient required A for LEs. Patient plans on having help from her sister when she goes home.     Follow Up Recommendations  Home health PT;Supervision/Assistance - 24 hour     Does the patient have the potential to tolerate intense rehabilitation     Barriers to Discharge        Equipment Recommendations  Rolling walker with 5" wheels;3 in 1 bedside comode    Recommendations for Other Services    Frequency Min 5X/week   Plan Discharge plan remains appropriate;Frequency remains appropriate    Precautions / Restrictions Precautions Precautions: Back Precaution Comments:  Patient able to recall 1/3 precautions. Reviewed all precautions with patient.  Required Braces or Orthoses: Spinal Brace Spinal Brace: Applied in sitting position;Lumbar corset Restrictions Weight Bearing Restrictions: No   Pertinent Vitals/Pain     Mobility  Bed Mobility Bed Mobility: Sit to Sidelying Left;Rolling Left;Rolling Right Rolling Right: 5: Supervision Rolling Left: 5: Supervision Left Sidelying to Sit: 4: Min assist Sit to Sidelying Left: 4: Min assist Details for Bed Mobility Assistance: Patient required A for BLEs back into bed and out of bed. Patients R LE with increased weakness. Patient required cues for log roll technique Transfers Sit to Stand: 4: Min guard;With upper extremity assist;From bed;From chair/3-in-1 Stand to Sit: With upper extremity assist;4: Min guard;To bed;To chair/3-in-1 Details for Transfer Assistance: Patient stood x2 with cues for safe hand placement.  Ambulation/Gait Ambulation/Gait Assistance: 4: Min  guard Ambulation Distance (Feet): 150 Feet Assistive device: Rolling walker Ambulation/Gait Assistance Details: Cues for positioning inside of RW and cues for posture.  Gait Pattern: Step-through pattern;Decreased stride length    Exercises     PT Diagnosis:    PT Problem List:   PT Treatment Interventions:     PT Goals Acute Rehab PT Goals PT Goal: Rolling Supine to Left Side - Progress: Progressing toward goal PT Goal: Supine/Side to Sit - Progress: Progressing toward goal PT Goal: Sit to Supine/Side - Progress: Progressing toward goal PT Goal: Sit to Stand - Progress: Progressing toward goal PT Goal: Stand to Sit - Progress: Progressing toward goal PT Goal: Ambulate - Progress: Progressing toward goal Additional Goals PT Goal: Additional Goal #1 - Progress: Progressing toward goal  Visit Information  Last PT Received On: 03/02/12 Assistance Needed: +1    Subjective Data      Cognition  Overall Cognitive Status: Appears within functional limits for tasks assessed/performed Arousal/Alertness: Awake/alert Orientation Level: Appears intact for tasks assessed Behavior During Session: [facility] for tasks performed Cognition - Other Comments: Patient stated she was getting sleepy from her meds    Balance     End of Session PT - End of Session Equipment Utilized During Treatment: Gait belt;Back brace Activity Tolerance: Patient tolerated treatment well Patient left: in chair;with call bell/phone within reach Nurse Communication: Mobility status   GP     Jacqualyn Posey 03/02/2012, 9:55 AM 03/02/2012 Jacqualyn Posey PTA [phone] pager [phone] office

## 2012-03-02 NOTE — Progress Notes (Signed)
Patient ID: Tina Molina, female   DOB: 05-Nov-1945, 66 y.o.   MRN: [id number] Subjective: Patient reports some leg pain  Objective: Vital signs in last 24 hours: Temp:  [97.8 F (36.6 C)-98.5 F (36.9 C)] 98 F (36.7 C) (10/17 1408) Pulse Rate:  [61-113] 108  (10/17 1408) Resp:  [18-20] 18  (10/17 1408) BP: (100-142)/(43-68) 115/45 mmHg (10/17 1408) SpO2:  [91 %-100 %] 98 % (10/17 1408)  Intake/Output from previous day: 10/16 0701 - 10/17 0700 In: 1050 [I.V.:100; Blood:700] Out: 1700 [Urine:900; Drains:800] Intake/Output this shift: Total I/O In: 200 [Other:200] Out: -   Wound:clean and dry  Lab Results:  [facility] 03/02/12 0525 03/01/12 0844  WBC 6.2 13.1*  HGB 8.7* 7.6*  HCT 25.4* 22.3*  PLT 138* 218   BMET No results found for this basename: NA:2,K:2,CL:2,CO2:2,GLUCOSE:2,BUN:2,CREATININE:2,CALCIUM:2 in the last 72 hours  Studies/Results: Dg Lumbar Spine 2-3 Views  02/29/2012  *RADIOLOGY REPORT*  Clinical Data: L2-L5 posterior fixation.  LUMBAR SPINE - 2-3 VIEW,DG C-ARM GT 120 MIN  Comparison:  Lumbar spine MRI of 10/06/2011  Findings: AP and lateral intraoperative views labeled 17:24 hours. Trans pedicle screw fixation at 4 levels.  Given underpenetration and field of view, levels difficult to localize.  Favored to represent L2-L5.  No acute hardware complication.  IMPRESSION: Intraoperative imaging of trans pedicle screw fixation at 4 levels. Favored to be L2-L5, but difficult to confirm secondary to technique.   Original Report Authenticated By: Areta Haber, M.D.    Dg C-arm Gt 120 Min  02/29/2012  *RADIOLOGY REPORT*  Clinical Data: L2-L5 posterior fixation.  LUMBAR SPINE - 2-3 VIEW,DG C-ARM GT 120 MIN  Comparison:  Lumbar spine MRI of 10/06/2011  Findings: AP and lateral intraoperative views labeled 17:24 hours. Trans pedicle screw fixation at 4 levels.  Given underpenetration and field of view, levels difficult to localize.  Favored to represent L2-L5.  No  acute hardware complication.  IMPRESSION: Intraoperative imaging of trans pedicle screw fixation at 4 levels. Favored to be L2-L5, but difficult to confirm secondary to technique.   Original Report Authenticated By: Areta Haber, M.D.     Assessment/Plan: Doing fairly well POD 2. Had 2 units prbc yesterday. Will continue present rx. Will increase activity as tolerated, and recheck cbc in am.  LOS: 2 days  as above   Faythe Ghee, MD 03/02/2012, 4:31 PM

## 2012-03-03 ENCOUNTER — Inpatient Hospital Stay (HOSPITAL_COMMUNITY): Payer: Medicare Other

## 2012-03-03 DIAGNOSIS — R069 Unspecified abnormalities of breathing: Secondary | ICD-10-CM

## 2012-03-03 DIAGNOSIS — N179 Acute kidney failure, unspecified: Secondary | ICD-10-CM

## 2012-03-03 DIAGNOSIS — I10 Essential (primary) hypertension: Secondary | ICD-10-CM

## 2012-03-03 DIAGNOSIS — R0902 Hypoxemia: Secondary | ICD-10-CM | POA: Diagnosis present

## 2012-03-03 DIAGNOSIS — M48062 Spinal stenosis, lumbar region with neurogenic claudication: Principal | ICD-10-CM

## 2012-03-03 DIAGNOSIS — I959 Hypotension, unspecified: Secondary | ICD-10-CM

## 2012-03-03 LAB — BASIC METABOLIC PANEL
BUN: 45 mg/dL — ABNORMAL HIGH (ref 6–23)
CO2: 22 mEq/L (ref 19–32)
Calcium: 8.3 mg/dL — ABNORMAL LOW (ref 8.4–10.5)
Chloride: 101 mEq/L (ref 96–112)
Creatinine, Ser: 3.24 mg/dL — ABNORMAL HIGH (ref 0.50–1.10)
GFR calc Af Amer: 16 mL/min — ABNORMAL LOW (ref >90)
GFR calc non Af Amer: 14 mL/min — ABNORMAL LOW (ref >90)
Glucose, Bld: 145 mg/dL — ABNORMAL HIGH (ref 70–99)
Potassium: 5 mEq/L (ref 3.5–5.1)
Sodium: 136 mEq/L (ref 135–145)

## 2012-03-03 LAB — CBC
HCT: 26.4 % — ABNORMAL LOW (ref 36.0–46.0)
Hemoglobin: 8.9 g/dL — ABNORMAL LOW (ref 12.0–15.0)
MCH: 31.7 pg (ref 26.0–34.0)
MCHC: 33.7 g/dL (ref 30.0–36.0)
MCV: 94 fL (ref 78.0–100.0)
Platelets: 173 10*3/uL (ref 150–400)
RBC: 2.81 MIL/uL — ABNORMAL LOW (ref 3.87–5.11)
RDW: 14.9 % (ref 11.5–15.5)
WBC: 6.8 10*3/uL (ref 4.0–10.5)

## 2012-03-03 LAB — TROPONIN I: Troponin I: <0.3 ng/mL (ref <0.30)

## 2012-03-03 MED ORDER — NALOXONE HCL 0.4 MG/ML IJ SOLN: 0.4000 mg | Freq: Once | INTRAMUSCULAR | Status: AC

## 2012-03-03 MED ORDER — NALOXONE HCL 0.4 MG/ML IJ SOLN
0.4000 mg | INTRAMUSCULAR | Status: DC | PRN
  Administered 2012-03-03 (×2): 0.4 mg via INTRAVENOUS

## 2012-03-03 MED ORDER — NALOXONE HCL 0.4 MG/ML IJ SOLN
INTRAMUSCULAR | Status: AC
  Filled 2012-03-03: qty 3

## 2012-03-03 MED ORDER — SODIUM CHLORIDE 0.9 % IV BOLUS (SEPSIS)
1000.0000 mL | INTRAVENOUS | Status: AC | PRN
  Administered 2012-03-03 – 2012-03-04 (×4): 1000 mL via INTRAVENOUS

## 2012-03-03 MED ORDER — TECHNETIUM TO 99M ALBUMIN AGGREGATED
3.0000 | Freq: Once | INTRAVENOUS | Status: AC | PRN
  Administered 2012-03-03: 3 via INTRAVENOUS

## 2012-03-03 NOTE — Progress Notes (Signed)
Patient ID: Tina Molina, female   DOB: [birth year], 66 y.o.   MRN: [id number] This is a late entry note on the [facility] as those making rounds up on [place] this evening found her to be very lethargic and hypotensive with blood pressure in the 80s she was responsive she is moving her legs with symmetric strength and plating of some pain or legs but otherwise main problem was just she felt so lethargic and sleepy. She been worked up throughout the day with chest x-rays and a VQ scans raising a question of pulmonary emboli with her hypertension and her lethargy I said that point remove her down to the ICU and consulted the critical care service the medical I. hospitalist team was also notified of her transfer to the ICU.  Tina Molina presents a very complicated case this could just be the patient being overmedicated and narcotic sized however with a VQ scan of in intermediate risk of pulmonary emboli I will defer to the pulmonology and critical care folks as to the validity of this test the patient apparently also has had bump in her creatinine so she may not be a candidate for a spiral CT scan but at 3 days postoperative from a lumbar fusion she is at some increased risk of bleeding competition for anticoagulation however in the setting of a pulmonary embolus the patient is symptomatic from I think we have to accept the risk of anticoagulation to treat the acute problem and this was explained to the family.

## 2012-03-03 NOTE — Progress Notes (Signed)
Occupational Therapy Treatment Patient Details Name: Tina Molina MRN: [id number] DOB: 1945/07/30 Today's Date: 03/03/2012 Time: 0906-1003 OT Time Calculation (min): 57 min  OT Assessment / Plan / Recommendation Comments on Treatment Session This 66 yo not making progress today due to lethargy    Follow Up Recommendations  Skilled nursing facility;Supervision/Assistance - 24 hour (may need this now v. orginally HHOT, unless progresses more)       Equipment Recommendations  Rolling walker with 5" wheels;3 in 1 bedside comode       Frequency Min 2X/week   Plan Discharge plan needs to be updated    Precautions / Restrictions Precautions Precautions: Back Required Braces or Orthoses: Spinal Brace Spinal Brace: Applied in sitting position Restrictions Weight Bearing Restrictions: No   Pertinent Vitals/Pain See ADL comments    ADL  Toilet Transfer: +2 Total assistance;Performed Toilet Transfer: Patient Percentage: 50% Toilet Transfer Method: Stand pivot Toilet Transfer Equipment: Bedside commode Toileting - Clothing Manipulation and Hygiene: Performed;+2 Total assistance (one to stand and one for clothes and hygiene) Toileting - Clothing Manipulation and Hygiene: Patient Percentage: 0% Where Assessed - Toileting Clothing Manipulation and Hygiene: Standing ADL Comments: Plan today was to do a partial co-treat with PT including a tub tranfer to seat. Upon entering room, pt sitting in recliner with brace on looking rather groggy. She would open her eyes and talk to you when you called her name or asked her a question, however responses were quite slow.  Also noted were jerking motions of her arms(not under her control). Called RN into the room and  he checked her. I asked what her sats were earlier, he looked them up and they were good. We decided that we needed to check them again and all vitals, all vitals good except for  sats (72% on RA, cold fingers). RN Charlotte Crumb) put her on  3 liters on O2 and we coached her with purse lipped breathing with sats only coming up to the 80's. RN bumped her up to 4 liters with still at best 90%).. Decided we needed to get pt back to bed keeping O2 on her throughout. Back to bed after a side trip to the [facility]. Once settled in bed  looked at O2 sats again and still only in the 80's (finger, still cold), 70's (toe, cold), low 90's (forehead, warm)--however all only registering intermittently on two different kinds of machines. Called RN into the room again with his plan to call rapid response and then MD. Pt quite lethargic, greyish looking in the face, and only would awaken briefly when named called       OT Goals ADL Goals ADL Goal: Toilet Transfer - Progress: Not progressing (lethargy) ADL Goal: Toileting - Clothing Manipulation - Progress: Not progressing (lethargy) ADL Goal: Toileting - Hygiene - Progress: Not progressing (lethargy) Miscellaneous OT Goals OT Goal: Miscellaneous Goal #1 - Progress: Not progressing (lethargy)  Visit Information  Last OT Received On: 03/03/12 Assistance Needed: +2 PT/OT Co-Evaluation/Treatment: Yes    Subjective Data  Subjective: I feel tired      Cognition  Overall Cognitive Status: Impaired Arousal/Alertness: Lethargic Behavior During Session: Lethargic Cognition - Other Comments: lethargic, per RN (no pain meds since about 10 last PM)    Mobility   Bed Mobility Bed Mobility: Sit to Sidelying Left Sit to Sidelying Left: 1: +2 Total assist;HOB flat Sit to Sidelying Left: Patient Percentage: 40% Details for Bed Mobility Assistance: Max cues for technique Transfers Transfers: Sit to Stand;Stand to Sit  Sit to Stand: 3: Mod assist;With upper extremity assist;With armrests;From chair/3-in-1 Stand to Sit: 3: Mod assist;With upper extremity assist;With armrests;To chair/3-in-1 Details for Transfer Assistance: With max verbal cues for hand placement             End of Session OT - End of  Session Equipment Utilized During Treatment: Gait belt;Back brace (RW) Activity Tolerance: Patient limited by fatigue Patient left: in bed;with call bell/phone within reach;with family/visitor present;with nursing in room (sister in law) Nurse Communication:  (issues with O2 sats)    Almon Register [id number] 03/03/2012, 10:24 AM

## 2012-03-03 NOTE — Progress Notes (Signed)
Discussed and agree   03/03/2012 Barry Brunner, PT Pager: [phone]

## 2012-03-03 NOTE — Progress Notes (Signed)
[name] noticed really lethargic this morning. Took her vitals and noticed the O2 sat was on 70's in room air. Put her on nasal cannula with O2 4ltr and taper it down to 2ltr when she sats in 90's. Lung sounds are clear. Called rapid response to check and also notified doctor on call.

## 2012-03-03 NOTE — Progress Notes (Signed)
Clinical Social Work  CSW reviewed chart and attempted to meet with patient to discuss dc plans. Patient out of room. CSW will ask weekend CSW to follow up.  [place], [place] [phone] (Coverage for Darden Dates)

## 2012-03-03 NOTE — Progress Notes (Signed)
VASCULAR LAB PRELIMINARY  PRELIMINARY  PRELIMINARY  PRELIMINARY  Bilateral lower extremity venous duplex  completed.    Preliminary report:  No obvious evidence of deep or superficial vein thrombosis bilaterally.   Tina Molina, 03/03/2012, 8:13 PM

## 2012-03-03 NOTE — Progress Notes (Signed)
Patient ID: Tina Molina, female   DOB: 1946-04-26, 66 y.o.   MRN: [id number] Subjective: Patient reports very sleepy  Objective: Vital signs in last 24 hours: Temp:  [97.9 F (36.6 C)-99.1 F (37.3 C)] 99.1 F (37.3 C) (10/18 1015) Pulse Rate:  [99-113] 109  (10/18 1015) Resp:  [16-18] 18  (10/18 1015) BP: (129-160)/(61-74) 130/67 mmHg (10/18 1015) SpO2:  [76 %-98 %] 98 % (10/18 1015)  Intake/Output from previous day: 10/17 0701 - 10/18 0700 In: 360  Out: 90 [Drains:90] Intake/Output this shift:    lethargic, but arouses to alot of stim. Her cbc is ok today. Medical consult obtained. She had a perfusion scan that is suspicious for P.E. I am waiting to discuss that with internal medical doctors.   Lab Results:  Basename 03/03/12 0510 03/02/12 0525  WBC 6.8 6.2  HGB 8.9* 8.7*  HCT 26.4* 25.4*  PLT 173 138*   BMET No results found for this basename: NA:2,K:2,CL:2,CO2:2,GLUCOSE:2,BUN:2,CREATININE:2,CALCIUM:2 in the last 72 hours  Studies/Results: Dg Chest 2 View  03/03/2012  *RADIOLOGY REPORT*  Clinical Data: Hypoxia.  CHEST - 2 VIEW  Comparison: Chest x-ray dated 02/25/2012  Findings: Heart size and pulmonary vascularity are normal.  There are linear areas of scarring at both lung bases, unchanged. Lungs are otherwise clear.  No effusions.  IMPRESSION: No acute abnormalities.   Original Report Authenticated By: Larey Seat, M.D.    Nm Pulmonary Perfusion  03/03/2012  *RADIOLOGY REPORT*  Clinical Data: Hypoxia, status post back surgery  NM PULMONARY PERFUSION PARTICULATE  Radiopharmaceutical: 3MILLI CURIE MAA TECHNETIUM TO 103M ALBUMIN AGGREGATED  Comparison: Chest radiograph dated 03/03/2012  Findings: There is a medium size defect within the periphery of the right lower lobe.  Medium size defect is also noted within the superior segment of the left lower lobe.  The patient refused the ventilation portion of the examination.  IMPRESSION:  1.  Bilateral,  indeterminant medium sized segmental perfusion defects.  Examination is considered in the intermediate probability for acute pulmonary embolus.  These results will be called to the ordering clinician or representative by the Radiologist Assistant, and communication documented in the PACS Dashboard.   Original Report Authenticated By: Angelita Ingles, M.D.     Assessment/Plan: She had a period of decreased sats this am, and medical consult was obtained. She is somewhat somnolent now, but arouses to stim. She got pain meds earlier, and that could be the explanation.She has a bmp ordered for tomorrow, but I am going to get that now.  LOS: 3 days  As above Faythe Ghee, MD 03/03/2012, 4:52 PM

## 2012-03-03 NOTE — Progress Notes (Signed)
Called to assist with care of patient with low BP. Patient appeared lethargic but responsive. Due to bp of 84 systolic, patient was placed in [place] by unit staff. Skin is warm and dry. Heart and lung sounds unremarkable. Attending physician is at bedside. IV fluid bolus is being given. Orders received to transfer patient to unit 3100 for further evaluation. 20g cath was started in right ac x 1 attempt. No RRT protocols required at present.

## 2012-03-03 NOTE — Consult Note (Signed)
[facility] Medical Consultation  Novis Kempe [facility] [id number] DOB: 10/21/1945 DOA: 02/29/2012 PCP: Unice Cobble, MD   Requesting physician: Hal Neer Date of consultation: 03/03/2012 Reason for consultation: hypoxia  Impression/Recommendations Principal Problem:  *Hypoxia: -This is a 66 year old female which recently had a L2-L3 decompression laminectomy, who had a hypoxic episode with ambulation. She is slightly sleepy note to be lethargic as she speaks to me, she also has what appears to be a slow and garbled speech. Last dose of narcotic an episode of hypoxia was at 12 midnight the day before the episode. She does have chronic renal failure which may cause this narcotics to accumulate. So the most likely cause of her hypoxia may be due to narcotics. She was according to the nurse and physical therapy sleepy at the time of the episode. Her oxygen saturation improved after 4 L of oxygen. She was slightly tachycardic throughout the night. She has been getting multiple doses of narcotics IV and oral. I discussed the patient options and I told her that she can continue to have her narcotics but she needs to be careful. Is Narcan when necessary in case she desaturates. We'll try to use medications and set her right knee. I will  check for PE she is not on Lovenoxand she just had recent surgery she got tachycardic with this episode of hypoxia, doesn't know ability for PE . I cannot do a CT and regular she is chronic renal failure. So we'll go ahead and do a VQ scan.   RENAL INSUFFICIENCY, chronic -Function seems to be stable. Will continue IV fluids check a basic metabolic panel in the morning.   HYPERTENSION -Her blood pressure seems we will control continue current treatment.   Lumbar stenosis with neurogenic claudication -Status post L2-3 decompressive laminectomy with bilateral L2 and L3 decompressive foraminotomies, continue current management per Dr. Pola Corn  I will followup  again tomorrow. Please contact me if I can be of assistance in the meanwhile. Thank you for this consultation.  Chief Complaint: Hypoxia  HPI:  This is a 66 year old female with past medical history lumbar stenosis, hypertension, chronic kidney disease and sarcoid that comes in for L2-L3 decompression laminectomy secondary to claudication symptoms. She recently had surgery on 02/29/2012 was transferred to the neurosurgery floor. Had been getting narcotics and had an episode of hypoxia the morning of consult. His improve with 4 L of nasal cannula of oxygen. She was not given Narcan. She was tachycardic but her blood pressure remained stable. We're asked to evaluate for hypoxia.  Review of Systems:  Constitutional:  No weight loss, night sweats, Fevers, chills, fatigue.  HEENT:  No headaches, Difficulty swallowing,Tooth/dental problems,Sore throat,  No sneezing, itching, ear ache, nasal congestion, post nasal drip,  Cardio-vascular:  No chest pain, Orthopnea, PND, swelling in lower extremities, anasarca, dizziness, palpitations  GI:  No heartburn, indigestion, abdominal pain, nausea, vomiting, diarrhea, change in bowel habits, loss of appetite  Resp:  No shortness of breath with exertion or at rest. No excess mucus, no productive cough, No non-productive cough, No coughing up of blood.No change in color of mucus.No wheezing.No chest wall deformity  Skin:  no rash or lesions.  GU:  no dysuria, change in color of urine, no urgency or frequency. No flank pain.  Musculoskeletal:  No joint pain or swelling. No decreased range of motion. No back pain.  Psych:  No change in mood or affect. No depression or anxiety. No memory loss.   Past Medical History  Diagnosis Date  .  Pneumonia 04/2010    OP  . Anemia     Associated with leukopenia and lymphocytosis. This is been evaluated by Dr. Marin Olp . Her platelet count has been normal  . Hypertension   . Thyroid disease   . Chronic back pain     . Sarcoidosis   . Lymphocytosis   . GERD (gastroesophageal reflux disease)   . Chronic kidney disease   . Renal insufficiency   . Arthritis    Past Surgical History  Procedure Date  . Cholecystectomy   . Carpal tunnel release     Bilateral   . Cervical discectomy 06/2010    Dr.Pool  . Thyroidectomy 2003    For goiter  . Colonoscopy 2010   Social History:  reports that she quit smoking about 14 years ago. Her smoking use included Cigarettes. She has never used smokeless tobacco. She reports that she does not drink alcohol or use illicit drugs.  Allergies  Allergen Reactions  . Penicillins     Intolerance or allergy not remembered; her sister stated penicillin caused "boils under the skin"  . Shellfish Allergy Rash  . Sulfonamide Derivatives     Arthralgias; her sister stated that this caused a rash.  . Codeine     Nausea only with liquid codeine   Family History  Problem Relation Age of Onset  . Anemia Father   . Arthritis Father   . Heart failure Father 6    No CAD  . Diabetes Mother   . Arthritis Mother   . Glaucoma Mother   . Heart disease Mother 54    No CAD  . Hypertension Brother   . Anemia Brother   . Kidney disease Brother   . Hypertension Sister   . Kidney disease Sister   . Anemia Sister   . Diabetes Maternal Aunt   . Arthritis Paternal Grandmother     Prior to Admission medications   Medication Sig Start Date End Date Taking? Authorizing Provider  aspirin 81 MG tablet Take 81 mg by mouth daily.    Yes Historical Provider, MD  budesonide-formoterol (SYMBICORT) 160-4.5 MCG/ACT inhaler Inhale 2 puffs into the lungs 2 (two) times daily as needed. For wheezing   Yes Historical Provider, MD  carvedilol (COREG) 25 MG tablet Take 1 tablet (25 mg total) by mouth 2 (two) times daily. 09/23/11 09/22/12 Yes Minus Breeding, MD  Cholecalciferol (VITAMIN D3) 1000 UNITS CAPS Take 1,000 Units by mouth daily.    Yes Historical Provider, MD  cloNIDine (CATAPRES) 0.1 MG  tablet Take 3 tablets (0.3 mg total) by mouth 2 (two) times daily. 10/15/11  Yes Minus Breeding, MD  Cyanocobalamin (B-12 SL) Place 1 tablet under the tongue daily.    Yes Historical Provider, MD  cyclobenzaprine (FLEXERIL) 10 MG tablet Take 10 mg by mouth every 8 (eight) hours as needed. For muscle spasms   Yes Historical Provider, MD  diclofenac sodium (VOLTAREN) 1 % GEL Apply 4 g topically 4 (four) times daily. To knee for pain   Yes Historical Provider, MD  doxepin (SINEQUAN) 10 MG capsule Take 30 mg by mouth at bedtime.    Yes Historical Provider, MD  fluocinonide ointment (LIDEX) 0.05 % USE EVERY OTHER DAY ON SCALP, NOT ON FACE 08/19/11  Yes Hendricks Limes, MD  gabapentin (NEURONTIN) 300 MG capsule Take 300 mg by mouth every 8 (eight) hours.   Yes Historical Provider, MD  HYDROcodone-acetaminophen (NORCO) 5-325 MG per tablet Take 1-2 tablets by mouth every 6 (six)  hours as needed. For pain   Yes Historical Provider, MD  hydrocortisone 2.5 % cream APPLY TO AFFECTED AREA ONE TIME PER DAY AROUND MOUTH FOR SCALING AND DRYNESS 08/19/11  Yes Hendricks Limes, MD  levothyroxine (SYNTHROID, LEVOTHROID) 150 MCG tablet Take 75-150 mcg by mouth daily. Takes 1/2 tablet on Tuesday and Thursday   Yes Historical Provider, MD  losartan (COZAAR) 100 MG tablet Take 100 mg by mouth daily.   Yes Historical Provider, MD  Multiple Vitamin (MULTIVITAMIN WITH MINERALS) TABS Take 1 tablet by mouth daily.   Yes Historical Provider, MD  spironolactone (ALDACTONE) 50 MG tablet Take 1 tablet (50 mg total) by mouth daily. 10/15/11 10/14/12 Yes Minus Breeding, MD  vitamin E 400 UNIT capsule Take 400 Units by mouth daily.    Yes Historical Provider, MD  ranitidine (ZANTAC) 150 MG tablet Take 150 mg by mouth 2 (two) times daily.    Historical Provider, MD   Physical Exam: Blood pressure 130/67, pulse 109, temperature 99.1 F (37.3 C), temperature source Oral, resp. rate 18, height 5\' 4"  (1.626 m), weight 106.6 kg (235 lb 0.2 oz),  SpO2 98.00%. Filed Vitals:   03/03/12 0900 03/03/12 0930 03/03/12 1000 03/03/12 1015  BP: 134/61 138/64  130/67  Pulse: 104   109  Temp: 99.1 F (37.3 C)   99.1 F (37.3 C)  TempSrc: Oral   Oral  Resp: 16 18  18   Height:      Weight:      SpO2: 76% 82% 85% 98%     General:  She is awake alert and oriented x3 easily arousable sleepy  Eyes: Anicteric  ENT: Moist mucous membranes  Neck: No JVD  Cardiovascular: Regular rate and rhythm no appreciated murmurs rubs gallops  Respiratory: Good air movement clear to auscultation  Abdomen: Bowel sounds nontender nondistended soft  Skin: No rashes  Musculoskeletal: Intact  Psychiatric:  Appropriate But sleepy  Neurologic: Awake alert and oriented x4 coherent for language and nonfocal extremity exam limited by pain.  Labs on Admission:  Basic Metabolic Panel:  Lab [id number]  NA 142  K 4.1  CL 105  CO2 25  GLUCOSE 101*  BUN 17  CREATININE 1.21*  CALCIUM 9.7  MG --  PHOS --   Liver Function Tests: No results found for this basename: AST:5,ALT:5,ALKPHOS:5,BILITOT:5,PROT:5,ALBUMIN:5 in the last 168 hours No results found for this basename: LIPASE:5,AMYLASE:5 in the last 168 hours No results found for this basename: AMMONIA:5 in the last 168 hours CBC:  Lab 03/03/12 0510 03/02/12 0525 03/01/12 0844 02/25/12 1531  WBC 6.8 6.2 13.1* 5.4  NEUTROABS -- -- -- 1.6*  HGB 8.9* 8.7* 7.6* 11.5*  HCT 26.4* 25.4* 22.3* 33.4*  MCV 94.0 93.0 95.3 95.4  PLT 173 138* 218 275   Cardiac Enzymes: No results found for this basename: CKTOTAL:5,CKMB:5,CKMBINDEX:5,TROPONINI:5 in the last 168 hours BNP: No components found with this basename: POCBNP:5 CBG: No results found for this basename: GLUCAP:5 in the last 168 hours  Radiological Exams on Admission: No results found.  EKG: Independently reviewed. None  Time spent: 65 minutes  Charlynne Cousins [facility] Pager [phone]  If 7PM-7AM, please contact  night-coverage [email or web address] Password [email or web address] 03/03/2012, 1:14 PM

## 2012-03-03 NOTE — Progress Notes (Signed)
Physical Therapy Treatment Patient Details Name: Tina Molina MRN: [id number] DOB: [birth year] Today's Date: 03/03/2012 Time: 0906-1003 PT Time Calculation (min): 57 min  PT Assessment / Plan / Recommendation Comments on Treatment Session  Patient not progressing this session. Upon ambulating with patient we noticed that she had increasd fatique, stated she was really sleepy and unable to stay focused. Placed O2 monitor on patient and patients O2 read in the low 70s on RA. RN was made aware and patient placed on 4L O2 and still having dificulty saturating above 85%. Rn aware of sitiuation and patient placed back in bed.     Follow Up Recommendations  Home health PT;Post acute inpatient (May need to DC to SNF dependent on progress)     Does the patient have the potential to tolerate intense rehabilitation  No, Recommend SNF  Barriers to Discharge        Equipment Recommendations  Rolling walker with 5" wheels;3 in 1 bedside comode    Recommendations for Other Services    Frequency Min 5X/week   Plan Discharge plan needs to be updated;Frequency remains appropriate    Precautions / Restrictions Precautions Precautions: Back Required Braces or Orthoses: Spinal Brace Spinal Brace: Applied in sitting position Restrictions Weight Bearing Restrictions: No   Pertinent Vitals/Pain     Mobility  Bed Mobility Bed Mobility: Sit to Sidelying Left Sit to Sidelying Left: 1: +2 Total assist;HOB flat Sit to Sidelying Left: Patient Percentage: 40% Details for Bed Mobility Assistance: Max cues for technique. A for shoulders and BLE positioning back into bed Transfers Sit to Stand: 3: Mod assist;With upper extremity assist;With armrests;From chair/3-in-1 Stand to Sit: 3: Mod assist;With upper extremity assist;With armrests;To chair/3-in-1 Details for Transfer Assistance: Cues for hand placement  Ambulation/Gait Ambulation/Gait Assistance: 3: Mod assist (+1 for safety) Ambulation  Distance (Feet): 12 Feet Assistive device: Rolling walker Ambulation/Gait Assistance Details: Patient having difficulty standing tall today. Buckling of BLEs, patient unable to progress any further this session due to fatique and O2 Gait Pattern: Step-through pattern;Decreased stride length    Exercises     PT Diagnosis:    PT Problem List:   PT Treatment Interventions:     PT Goals Acute Rehab PT Goals PT Goal: Sit to Supine/Side - Progress: Not progressing PT Goal: Sit to Stand - Progress: Not progressing PT Goal: Stand to Sit - Progress: Not progressing PT Goal: Ambulate - Progress: Not progressing  Visit Information  Last PT Received On: 03/03/12 Assistance Needed: +2 PT/OT Co-Evaluation/Treatment: Yes    Subjective Data      Cognition  Overall Cognitive Status: Impaired Arousal/Alertness: Lethargic Behavior During Session: Lethargic Cognition - Other Comments: lethargic, per RN (no pain meds since about 10 last PM)    Balance     End of Session PT - End of Session Equipment Utilized During Treatment: Gait belt;Back brace Activity Tolerance: Treatment limited secondary to medical complications (Comment) (decreased O2) Patient left: in bed;with nursing in room Nurse Communication: Mobility status;Other (comment) (O2)   GP     Jacqualyn Posey 03/03/2012, 11:48 AM 03/03/2012 Jacqualyn Posey PTA [phone] pager [phone] office

## 2012-03-03 NOTE — Consult Note (Signed)
Name: Tina Molina MRN: [id number] DOB: 08-19-1945    LOS: 3  PULMONARY / CRITICAL CARE MEDICINE  HPI:  Tina Molina is a 66 year-old lady who underwent L2-L3 laminectomy 3 days ago, who has been intermittently hypoxic over the last few days, and this evening developed hypotension.  She had undergone PVL scanning earlier today, which was negative, and a perfusion scan (refused ventilation portion) which was read as indeterminate.  Her creatinine has increased to 3.24 from 1.21 on admission.  She has been getting opiates for pain relief.  She also has been noted to be somewhat lethargic over the last day or so.  She is only minimally answering questions on my exam.   Past Medical History  Diagnosis Date  . Pneumonia 04/2010    OP  . Anemia     Associated with leukopenia and lymphocytosis. This is been evaluated by Dr. Marin Olp . Her platelet count has been normal  . Hypertension   . Thyroid disease   . Chronic back pain   . Sarcoidosis   . Lymphocytosis   . GERD (gastroesophageal reflux disease)   . Chronic kidney disease   . Renal insufficiency   . Arthritis    Past Surgical History  Procedure Date  . Cholecystectomy   . Carpal tunnel release     Bilateral   . Cervical discectomy 06/2010    Dr.Pool  . Thyroidectomy 2003    For goiter  . Colonoscopy 2010   Prior to Admission medications   Medication Sig Start Date End Date Taking? Authorizing Provider  aspirin 81 MG tablet Take 81 mg by mouth daily.    Yes Historical Provider, MD  budesonide-formoterol (SYMBICORT) 160-4.5 MCG/ACT inhaler Inhale 2 puffs into the lungs 2 (two) times daily as needed. For wheezing   Yes Historical Provider, MD  carvedilol (COREG) 25 MG tablet Take 1 tablet (25 mg total) by mouth 2 (two) times daily. 09/23/11 09/22/12 Yes Minus Breeding, MD  Cholecalciferol (VITAMIN D3) 1000 UNITS CAPS Take 1,000 Units by mouth daily.    Yes Historical Provider, MD  cloNIDine (CATAPRES) 0.1 MG tablet Take 3  tablets (0.3 mg total) by mouth 2 (two) times daily. 10/15/11  Yes Minus Breeding, MD  Cyanocobalamin (B-12 SL) Place 1 tablet under the tongue daily.    Yes Historical Provider, MD  cyclobenzaprine (FLEXERIL) 10 MG tablet Take 10 mg by mouth every 8 (eight) hours as needed. For muscle spasms   Yes Historical Provider, MD  diclofenac sodium (VOLTAREN) 1 % GEL Apply 4 g topically 4 (four) times daily. To knee for pain   Yes Historical Provider, MD  doxepin (SINEQUAN) 10 MG capsule Take 30 mg by mouth at bedtime.    Yes Historical Provider, MD  fluocinonide ointment (LIDEX) 0.05 % USE EVERY OTHER DAY ON SCALP, NOT ON FACE 08/19/11  Yes Hendricks Limes, MD  gabapentin (NEURONTIN) 300 MG capsule Take 300 mg by mouth every 8 (eight) hours.   Yes Historical Provider, MD  HYDROcodone-acetaminophen (NORCO) 5-325 MG per tablet Take 1-2 tablets by mouth every 6 (six) hours as needed. For pain   Yes Historical Provider, MD  hydrocortisone 2.5 % cream APPLY TO AFFECTED AREA ONE TIME PER DAY AROUND MOUTH FOR SCALING AND DRYNESS 08/19/11  Yes Hendricks Limes, MD  levothyroxine (SYNTHROID, LEVOTHROID) 150 MCG tablet Take 75-150 mcg by mouth daily. Takes 1/2 tablet on Tuesday and Thursday   Yes Historical Provider, MD  losartan (COZAAR) 100 MG tablet Take 100  mg by mouth daily.   Yes Historical Provider, MD  Multiple Vitamin (MULTIVITAMIN WITH MINERALS) TABS Take 1 tablet by mouth daily.   Yes Historical Provider, MD  spironolactone (ALDACTONE) 50 MG tablet Take 1 tablet (50 mg total) by mouth daily. 10/15/11 10/14/12 Yes Minus Breeding, MD  vitamin E 400 UNIT capsule Take 400 Units by mouth daily.    Yes Historical Provider, MD  ranitidine (ZANTAC) 150 MG tablet Take 150 mg by mouth 2 (two) times daily.    Historical Provider, MD   Allergies Allergies  Allergen Reactions  . Penicillins     Intolerance or allergy not remembered; her sister stated penicillin caused "boils under the skin"  . Shellfish Allergy Rash  .  Sulfonamide Derivatives     Arthralgias; her sister stated that this caused a rash.  . Codeine     Nausea only with liquid codeine    Family History Family History  Problem Relation Age of Onset  . Anemia Father   . Arthritis Father   . Heart failure Father 66    No CAD  . Diabetes Mother   . Arthritis Mother   . Glaucoma Mother   . Heart disease Mother 20    No CAD  . Hypertension Brother   . Anemia Brother   . Kidney disease Brother   . Hypertension Sister   . Kidney disease Sister   . Anemia Sister   . Diabetes Maternal Aunt   . Arthritis Paternal Grandmother    Social History  reports that she quit smoking about 14 years ago. Her smoking use included Cigarettes. She has never used smokeless tobacco. She reports that she does not drink alcohol or use illicit drugs.  Review Of Systems:  Unable to provide.  Brief patient description:  66 year-old s/p laminectomy with acute on chronic renal insufficiency.  Events Since Admission: Laminectomy 10/15 Rapid response 10/18  Current Status: Guarded  Vital Signs: Temp:  [98.1 F (36.7 C)-99.1 F (37.3 C)] 98.2 F (36.8 C) (10/18 2059) Pulse Rate:  [99-109] 99  (10/18 2100) Resp:  [15-20] 15  (10/18 2100) BP: (85-148)/(41-74) 95/41 mmHg (10/18 2100) SpO2:  [76 %-100 %] 100 % (10/18 2100) Weight:  [103.8 kg (228 lb 13.4 oz)] 103.8 kg (228 lb 13.4 oz) (10/18 2059)  Physical Examination: General:  In no acute distress, somewhat sleepy on initial exam. Neuro:  Stuporous, arouses with stimulation, but quickly drifts off to sleep. HEENT:  PERRL, pink conjunctivae, moist membranes Neck:  Supple, no JVD   Cardiovascular:  RRR, no M/R/G Lungs:  Diminished, shallow breathing, no crackles Abdomen:  Soft, nontender, nondistended, bowel sounds present Musculoskeletal:  Moves all extremities, no pedal edema Skin:  No rash  Principal Problem:  *Hypoxia Active Problems:  HYPERTENSION  RENAL INSUFFICIENCY, ACUTE  Lumbar  stenosis with neurogenic claudication   ASSESSMENT AND PLAN  PULMONARY CXR:  No infiltrates Q scan:  Intermediate probability  A:  Hypoventilation P:   Patient's ventilatory pattern was very shallow, with intermittent obstructions when she arrived to the ICU.  She responded to Narcan, with increased respiratory rate, alertness, and blood pressure.  Oxygen saturations were maintained the entire time on [place].  CARDIOVASCULAR  Lab 03/03/12 1856  TROPONINI <0.30  LATICACIDVEN --  PROBNP --   A: Hypotension P:  Seems likely secondary to mild hypovolemia compounded by the accumulation of opiates.  This has improved with fluid bolus and Narcan. -Hold antihypertensives -Consider Narcan drip if blood pressure drops again.  RENAL  Lab 03/03/12 1849  NA 136  K 5.0  CL 101  CO2 22  BUN 45*  CREATININE 3.24*  CALCIUM 8.3*  MG --  PHOS --   Intake/Output      10/18 0701 - 10/19 0700   I.V. (mL/kg) 500 (4.8)   Other 100   Total Intake(mL/kg) 600 (5.8)   Drains    Total Output    Net +600        Foley:  None  A:  Acute on chronic renal failure P:   -Baseline creatinine appeared to be 1.2, now 3.2. -Likely secondary to intraoperative hypotension, hypovolemia, and baseline renal dysfunction. -Monitor UOP -Avoid nephrotoxins  HEMATOLOGIC  Lab 03/03/12 0510 03/02/12 0525 03/01/12 0844  HGB 8.9* 8.7* 7.6*  HCT 26.4* 25.4* 22.3*  PLT 173 138* 218  INR -- -- --  APTT -- -- --   A:  Anemia, improving P:  Postoperative, no reason to intervene.  INFECTIOUS  Lab 03/03/12 0510 03/02/12 0525 03/01/12 0844  WBC 6.8 6.2 13.1*  PROCALCITON -- -- --   Cultures: None currently Antibiotics: None currently  A:  SIRS P:   Low suspicion for sepsis.  Too early for postoperative wound infection.  No foley, no fever, no WBC.  Hold on antibiotics.  BEST PRACTICE / DISPOSITION - Level of Care:  ICU - Primary Service:  Pool - Consultants:  PCCM - Code Status:  Full -  Diet:  Regular - DVT Px:  SCD's - GI Px:  Famotidine - Skin Integrity:  Good - Social / Family:  Updated family at bedside  Gaylyn Cheers, M.D. [facility] [place] Pager: [phone]  03/03/2012, 10:20 PM

## 2012-03-03 NOTE — Progress Notes (Signed)
DG chest and NM pulmonary perfusion done. Result reported to internal medicine, received new order for lower ext venous duplex.

## 2012-03-04 DIAGNOSIS — N179 Acute kidney failure, unspecified: Secondary | ICD-10-CM | POA: Diagnosis not present

## 2012-03-04 DIAGNOSIS — I369 Nonrheumatic tricuspid valve disorder, unspecified: Secondary | ICD-10-CM

## 2012-03-04 DIAGNOSIS — I959 Hypotension, unspecified: Secondary | ICD-10-CM | POA: Diagnosis present

## 2012-03-04 DIAGNOSIS — J9600 Acute respiratory failure, unspecified whether with hypoxia or hypercapnia: Secondary | ICD-10-CM | POA: Diagnosis present

## 2012-03-04 LAB — BLOOD GAS, ARTERIAL
Acid-base deficit: 7.5 mmol/L — ABNORMAL HIGH (ref 0.0–2.0)
Acid-base deficit: 7.7 mmol/L — ABNORMAL HIGH (ref 0.0–2.0)
Bicarbonate: 18.3 mEq/L — ABNORMAL LOW (ref 20.0–24.0)
Bicarbonate: 18.6 mEq/L — ABNORMAL LOW (ref 20.0–24.0)
Drawn by: 10552
Drawn by: 252031
O2 Content: 2 L/min
O2 Content: 3 L/min
O2 Saturation: 99.1 %
O2 Saturation: 99.3 %
Patient temperature: 98.6
Patient temperature: 98.6
TCO2: 19.6 mmol/L (ref 0–100)
TCO2: 20 mmol/L (ref 0–100)
pCO2 arterial: 43.3 mmHg (ref 35.0–45.0)
pCO2 arterial: 45.1 mmHg — ABNORMAL HIGH (ref 35.0–45.0)
pH, Arterial: 7.238 — ABNORMAL LOW (ref 7.350–7.450)
pH, Arterial: 7.248 — ABNORMAL LOW (ref 7.350–7.450)
pO2, Arterial: 121 mmHg — ABNORMAL HIGH (ref 80.0–100.0)
pO2, Arterial: 127 mmHg — ABNORMAL HIGH (ref 80.0–100.0)

## 2012-03-04 LAB — PHOSPHORUS: Phosphorus: 3.9 mg/dL (ref 2.3–4.6)

## 2012-03-04 LAB — CBC
HCT: 21.2 % — ABNORMAL LOW (ref 36.0–46.0)
HCT: 26.2 % — ABNORMAL LOW (ref 36.0–46.0)
Hemoglobin: 7.1 g/dL — ABNORMAL LOW (ref 12.0–15.0)
Hemoglobin: 8.7 g/dL — ABNORMAL LOW (ref 12.0–15.0)
MCH: 31.3 pg (ref 26.0–34.0)
MCH: 30.7 pg (ref 26.0–34.0)
MCHC: 33.5 g/dL (ref 30.0–36.0)
MCHC: 33.2 g/dL (ref 30.0–36.0)
MCV: 93.4 fL (ref 78.0–100.0)
MCV: 92.6 fL (ref 78.0–100.0)
Platelets: 157 10*3/uL (ref 150–400)
Platelets: 153 10*3/uL (ref 150–400)
RBC: 2.27 MIL/uL — ABNORMAL LOW (ref 3.87–5.11)
RBC: 2.83 MIL/uL — ABNORMAL LOW (ref 3.87–5.11)
RDW: 14.7 % (ref 11.5–15.5)
RDW: 14.6 % (ref 11.5–15.5)
WBC: 5.8 10*3/uL (ref 4.0–10.5)
WBC: 6.8 10*3/uL (ref 4.0–10.5)

## 2012-03-04 LAB — COMPREHENSIVE METABOLIC PANEL
ALT: 61 U/L — ABNORMAL HIGH (ref 0–35)
AST: 76 U/L — ABNORMAL HIGH (ref 0–37)
Albumin: 2.3 g/dL — ABNORMAL LOW (ref 3.5–5.2)
Alkaline Phosphatase: 182 U/L — ABNORMAL HIGH (ref 39–117)
BUN: 49 mg/dL — ABNORMAL HIGH (ref 6–23)
CO2: 22 mEq/L (ref 19–32)
Calcium: 7 mg/dL — ABNORMAL LOW (ref 8.4–10.5)
Chloride: 104 mEq/L (ref 96–112)
Creatinine, Ser: 3.34 mg/dL — ABNORMAL HIGH (ref 0.50–1.10)
GFR calc Af Amer: 16 mL/min — ABNORMAL LOW (ref >90)
GFR calc non Af Amer: 13 mL/min — ABNORMAL LOW (ref >90)
Glucose, Bld: 136 mg/dL — ABNORMAL HIGH (ref 70–99)
Potassium: 4.7 mEq/L (ref 3.5–5.1)
Sodium: 134 mEq/L — ABNORMAL LOW (ref 135–145)
Total Bilirubin: 0.5 mg/dL (ref 0.3–1.2)
Total Protein: 5.1 g/dL — ABNORMAL LOW (ref 6.0–8.3)

## 2012-03-04 LAB — MAGNESIUM
Magnesium: 2.5 mg/dL (ref 1.5–2.5)
Magnesium: 2.4 mg/dL (ref 1.5–2.5)

## 2012-03-04 LAB — GLUCOSE, CAPILLARY: Glucose-Capillary: 125 mg/dL — ABNORMAL HIGH (ref 70–99)

## 2012-03-04 LAB — LACTIC ACID, PLASMA: Lactic Acid, Venous: 0.6 mmol/L (ref 0.5–2.2)

## 2012-03-04 LAB — TROPONIN I
Troponin I: <0.3 ng/mL (ref <0.30)
Troponin I: <0.3 ng/mL (ref <0.30)

## 2012-03-04 LAB — PREPARE RBC (CROSSMATCH)

## 2012-03-04 MED ORDER — LOSARTAN POTASSIUM 50 MG PO TABS
100.0000 mg | ORAL_TABLET | Freq: Every day | ORAL | Status: DC
  Administered 2012-03-05 – 2012-03-11 (×7): 100 mg via ORAL
  Filled 2012-03-04 (×8): qty 2

## 2012-03-04 MED ORDER — SODIUM CHLORIDE 0.9 % IV SOLN: 250.0000 mL | INTRAVENOUS | Status: DC

## 2012-03-04 MED ORDER — SODIUM CHLORIDE 0.9 % IV BOLUS (SEPSIS): 500.0000 mL | Freq: Once | INTRAVENOUS | Status: DC

## 2012-03-04 MED ORDER — HYDROMORPHONE HCL PF 1 MG/ML IJ SOLN: 0.5000 mg | INTRAMUSCULAR | Status: DC | PRN

## 2012-03-04 MED ORDER — HYDROCODONE-ACETAMINOPHEN 5-325 MG PO TABS
1.0000 | ORAL_TABLET | Freq: Four times a day (QID) | ORAL | Status: DC | PRN
  Administered 2012-03-04 – 2012-03-06 (×4): 2 via ORAL
  Administered 2012-03-06: 1 via ORAL
  Administered 2012-03-06 – 2012-03-08 (×8): 2 via ORAL
  Administered 2012-03-09: 1 via ORAL
  Administered 2012-03-09: 2 via ORAL
  Administered 2012-03-10 – 2012-03-11 (×5): 1 via ORAL
  Filled 2012-03-04 (×4): qty 2
  Filled 2012-03-04: qty 1
  Filled 2012-03-04: qty 2
  Filled 2012-03-04: qty 1
  Filled 2012-03-04 (×2): qty 2
  Filled 2012-03-04 (×2): qty 1
  Filled 2012-03-04 (×2): qty 2
  Filled 2012-03-04 (×2): qty 1
  Filled 2012-03-04: qty 2
  Filled 2012-03-04: qty 1
  Filled 2012-03-04: qty 2
  Filled 2012-03-04: qty 1
  Filled 2012-03-04: qty 2
  Filled 2012-03-04: qty 1
  Filled 2012-03-04 (×2): qty 2

## 2012-03-04 MED ORDER — CAPSAICIN 0.075 % EX CREA
TOPICAL_CREAM | Freq: Two times a day (BID) | CUTANEOUS | Status: DC
  Administered 2012-03-04 – 2012-03-05 (×2): via TOPICAL
  Administered 2012-03-05: 1 via TOPICAL
  Administered 2012-03-06 – 2012-03-11 (×10): via TOPICAL
  Filled 2012-03-04 (×5): qty 60

## 2012-03-04 MED ORDER — CARVEDILOL 6.25 MG PO TABS
6.2500 mg | ORAL_TABLET | Freq: Two times a day (BID) | ORAL | Status: DC
  Administered 2012-03-05 – 2012-03-06 (×3): 6.25 mg via ORAL
  Filled 2012-03-04 (×6): qty 1

## 2012-03-04 MED ORDER — SODIUM CHLORIDE 0.9 % IV BOLUS (SEPSIS): 500.0000 mL | Freq: Once | INTRAVENOUS | Status: AC

## 2012-03-04 NOTE — Progress Notes (Signed)
Subjective: Patient reports She feels much better this morning more awake more alert she's not complaining of any back or leg pain she has no new numbness or tingling or legs the preoperative pain she had legs is improved.  Objective: Vital signs in last 24 hours: Temp:  [97.9 F (36.6 C)-99.1 F (37.3 C)] 98.2 F (36.8 C) (10/19 0700) Pulse Rate:  [25-114] 114  (10/19 0815) Resp:  [12-23] 16  (10/19 0815) BP: (75-138)/(25-75) 122/51 mmHg (10/19 0815) SpO2:  [76 %-100 %] 85 % (10/19 0700) Arterial Line BP: (85-135)/(34-57) 121/52 mmHg (10/19 0815) Weight:  [103.8 kg (228 lb 13.4 oz)] 103.8 kg (228 lb 13.4 oz) (10/18 2059)  Intake/Output from previous day: 10/18 0701 - 10/19 0700 In: 4053.8 [I.V.:550; Blood:212.5; IV Piggyback:3141.3] Out: 85 [Drains:85] Intake/Output this shift: Total I/O In: 150 [Blood:150] Out: -   Awake alert oriented strength out of 5 in her lower extremities wound is clean and dry took out her drain.  Lab Results:  [facility] 03/04/12 0439 03/03/12 0510  WBC 5.8 6.8  HGB 7.1* 8.9*  HCT 21.2* 26.4*  PLT 157 173   BMET  Basename 03/04/12 0439 03/03/12 1849  NA 134* 136  K 4.7 5.0  CL 104 101  CO2 22 22  GLUCOSE 136* 145*  BUN 49* 45*  CREATININE 3.34* 3.24*  CALCIUM 7.0* 8.3*    Studies/Results: Dg Chest 2 View  03/03/2012  *RADIOLOGY REPORT*  Clinical Data: Hypoxia.  CHEST - 2 VIEW  Comparison: Chest x-ray dated 02/25/2012  Findings: Heart size and pulmonary vascularity are normal.  There are linear areas of scarring at both lung bases, unchanged. Lungs are otherwise clear.  No effusions.  IMPRESSION: No acute abnormalities.   Original Report Authenticated By: Larey Seat, M.D.    Nm Pulmonary Perfusion  03/03/2012  *RADIOLOGY REPORT*  Clinical Data: Hypoxia, status post back surgery  NM PULMONARY PERFUSION PARTICULATE  Radiopharmaceutical: 3MILLI CURIE MAA TECHNETIUM TO 42M ALBUMIN AGGREGATED  Comparison: Chest radiograph dated  03/03/2012  Findings: There is a medium size defect within the periphery of the right lower lobe.  Medium size defect is also noted within the superior segment of the left lower lobe.  The patient refused the ventilation portion of the examination.  IMPRESSION:  1.  Bilateral, indeterminant medium sized segmental perfusion defects.  Examination is considered in the intermediate probability for acute pulmonary embolus.  These results will be called to the ordering clinician or representative by the Radiologist Assistant, and communication documented in the PACS Dashboard.   Original Report Authenticated By: Angelita Ingles, M.D.     Assessment/Plan: Patient appears to be doing better she is awake alert she's no longer lethargic her blood pressure seems to be better controlled after the transfusion will continue to observe in the ICU progressive mobilization.  LOS: 4 days     Kahlil Cowans P 03/04/2012, 8:47 AM

## 2012-03-04 NOTE — Procedures (Signed)
Arterial Catheter Insertion Procedure Note Shreya Guse Massmann-Smoot [id number] 06/18/1945  Procedure: Insertion of Arterial Catheter  Indications: Blood pressure monitoring  Procedure Details Consent: Risks of procedure as well as the alternatives and risks of each were explained to the (patient/caregiver).  Consent for procedure obtained. Time Out: Verified patient identification, verified procedure, site/side was marked, verified correct patient position, special equipment/implants available, medications/allergies/relevent history reviewed, required imaging and test results available.  Performed  Maximum sterile technique was used including cap, gloves, gown, hand hygiene and sheet. Skin prep: Chlorhexidine; local anesthetic administered 20 gauge catheter was inserted into left radial artery using the Seldinger technique.  Evaluation Blood flow good; BP tracing good. Complications: No apparent complications. Assisted by Gennaro Africa RRT, RCP. Followed Respiratory Art line Protocol.   Clare Gandy, Chales Abrahams 03/04/2012

## 2012-03-04 NOTE — Progress Notes (Addendum)
Name: Tina Molina MRN: [id number] DOB: 28-Oct-1945    LOS: 4  PULMONARY / CRITICAL CARE MEDICINE      Brief patient description:  66 year-old s/p laminectomy 10/15. Transferred back to ICU 10/18 with lethargy, hypotension, acute on chronic renal failure  Events Since Admission: Laminectomy 10/15 Rapid response 10/18 10-19 hd stable  SUBJ: lethargic, NAD, + F/C  Vital Signs: Temp:  [97.9 F (36.6 C)-99.1 F (37.3 C)] 98.2 F (36.8 C) (10/19 0700) Pulse Rate:  [25-114] 114  (10/19 0815) Resp:  [12-23] 16  (10/19 0815) BP: (75-138)/(25-75) 122/51 mmHg (10/19 0815) SpO2:  [76 %-100 %] 85 % (10/19 0700) Arterial Line BP: (85-135)/(34-57) 121/52 mmHg (10/19 0815) Weight:  [103.8 kg (228 lb 13.4 oz)] 103.8 kg (228 lb 13.4 oz) (10/18 2059)  Physical Examination: General:  In no acute distress, somewhat sleepy on initial exam. Neuro:  Awake and alert. HEENT:  PERRL, pink conjunctivae, moist membranes Neck:  Supple, no JVD   Cardiovascular:  RRR, no M/R/G Lungs:  Diminished, shallow breathing, no crackles Abdomen:  Soft, nontender, nondistended, bowel sounds present Musculoskeletal:  Moves all extremities, no pedal edema Skin:  No rash. Back dressing intact  Principal Problem:  *Hypoxia Active Problems:  HYPERTENSION  RENAL INSUFFICIENCY, ACUTE  Lumbar stenosis with neurogenic claudication Dg Chest 2 View  03/03/2012  *RADIOLOGY REPORT*  Clinical Data: Hypoxia.  CHEST - 2 VIEW  Comparison: Chest x-ray dated 02/25/2012  Findings: Heart size and pulmonary vascularity are normal.  There are linear areas of scarring at both lung bases, unchanged. Lungs are otherwise clear.  No effusions.  IMPRESSION: No acute abnormalities.   Original Report Authenticated By: Larey Seat, M.D.    Nm Pulmonary Perfusion  03/03/2012  *RADIOLOGY REPORT*  Clinical Data: Hypoxia, status post back surgery  NM PULMONARY PERFUSION PARTICULATE  Radiopharmaceutical: 3MILLI CURIE MAA  TECHNETIUM TO 18M ALBUMIN AGGREGATED  Comparison: Chest radiograph dated 03/03/2012  Findings: There is a medium size defect within the periphery of the right lower lobe.  Medium size defect is also noted within the superior segment of the left lower lobe.  The patient refused the ventilation portion of the examination.  IMPRESSION:  1.  Bilateral, indeterminant medium sized segmental perfusion defects.  Examination is considered in the intermediate probability for acute pulmonary embolus.  These results will be called to the ordering clinician or representative by the Radiologist Assistant, and communication documented in the PACS Dashboard.   Original Report Authenticated By: Angelita Ingles, M.D.      ASSESSMENT AND PLAN  PULMONARY  A:  Hypoxia, mild acute alveolar hypoventilation, intermediate prob Q scan P:   Minimize opioids Echo and LE dopplers ordered   CARDIOVASCULAR A: Hypotension - BP adequate by A-line. Suspect largely a function of inaccurate NIBP P:  -Hold antihypertensives -Monitor for rebound effect of clonidine withdrawal   RENAL  Lab 03/04/12 0439 03/04/12 0251 03/03/12 1849  NA 134* -- 136  K 4.7 -- 5.0  CL 104 -- 101  CO2 22 -- 22  BUN 49* -- 45*  CREATININE 3.34* -- 3.24*  CALCIUM 7.0* -- 8.3*  MG -- 2.5 --  PHOS -- -- --   Intake/Output      10/18 0701 - 10/19 0700 10/19 0701 - 10/20 0700   I.V. (mL/kg) 550 (5.3)    Blood 212.5 150   Other 150    IV Piggyback 3141.3    Total Intake(mL/kg) 4053.8 (39.1) 150 (1.4)   Drains 85  Total Output 85    Net +3968.8 +150        Stool Occurrence 1 x     Foley:  None  A:  Acute on chronic renal failure, on ARB - Baseline creatinine appeared to be 1.2  No indication for HD P:   - Cont IVFs and daily BMET   HEMATOLOGIC  Lab 03/04/12 0439 03/03/12 0510 03/02/12 0525 03/01/12 0844  HGB 7.1* 8.9* 8.7* 7.6*  HCT 21.2* 26.4* 25.4* 22.3*  PLT 157 173 138* 218  INR -- -- -- --  APTT -- -- -- --   A:   Anemia,  Concern for acute blood loss  One unit PRBCs 10/19 for Hgb 7.1 in setting of hypotension P:  Monitor daily CBC Avoid anticoags if possible   INFECTIOUS  Lab 03/04/12 0439 03/03/12 0510 03/02/12 0525 03/01/12 0844  WBC 5.8 6.8 6.2 13.1*  PROCALCITON -- -- -- --   Cultures: None currently Antibiotics: None currently  A:  No active issues P:   Low suspicion for sepsis.  Too early for postoperative wound infection.  No foley, no fever, no WBC.  Hold on antibiotics.  BEST PRACTICE / DISPOSITION - Level of Care:  ICU - Primary Service:  Pool - Consultants:  PCCM - Code Status:  Full - Diet:  Regular - DVT Px:  SCD's - GI Px:  Famotidine - Skin Integrity:  Good - Social / Family:  Updated family at bedside   CCM X 30 mins   Merton Border, MD ; [facility] service Mobile [phone].  After 5:30 PM or weekends, call [phone]

## 2012-03-04 NOTE — Progress Notes (Signed)
  Echocardiogram 2D Echocardiogram has been performed.  Tina Molina 03/04/2012, 11:48 AM

## 2012-03-04 NOTE — Progress Notes (Signed)
Was rounding after report to check on my patients. NA was at pt bedside assisting pt and as I stayed to help NA I noted pt to be lethargic but responsive and follows command. Pt was sleepy and appeared very drowsy. VS was taken and Pt was hypotensive in the 80's. Pt was put in Trendelenburg position, Charge nurse called and she came to assist pt. IV fluid bolus was adm. Order received to transfer pt to ICU. Family was visiting pt, updated of pt condition and plan for pt. ICU RN called and reported off to [facility]. Emptied 41ml out of pt Hemovac.to back. Transported to ICU 3100 with Rapid response RN, a Advertising copywriter and pt visiting family for further evaluation and higher level of care.

## 2012-03-04 NOTE — Progress Notes (Signed)
Dr. Saintclair Halsted notified of new onset twitching to all four extremities.  Able to stop twitching of extremities on command with spontaneous return of twitching with relaxation.  Stat mg, phos, calcium drawn and sent to lab.  Will notify Dr. Saintclair Halsted of any abnormal lab results.

## 2012-03-04 NOTE — Progress Notes (Signed)
Physical Therapy Treatment Patient Details Name: Tina Molina MRN: [id number] DOB: February 14, 1946 Today's Date: 03/04/2012 Time: [id number] PT Time Calculation (min): 29 min  PT Assessment / Plan / Recommendation Comments on Treatment Session  66 yo s/p L2-5 laminectomy and fusion with post-op complications of hypoxia, hypotension, and lethary with ?PE. Dr. Alva Garnet was on the unit and approved attempting OOB. Pt was assessed for ability to participate and was able to follow one step commands. As prepared pt for getting OOB, discovered her brace was not in the room (family took it home with her belongings on transfer to ICU). Proceeded with bed level exercises due to excessive edema and to further assess LUE as family has noted "jumping." LUE with good strength, however does not sustain contraction throughout ROM with "twitching/jerking" movement. RN made aware and to notify MD. Does not appear to be due to decr attention as pt performs same movements with other 3 extremities without jerking.     Follow Up Recommendations  Post acute inpatient     Does the patient have the potential to tolerate intense rehabilitation  Yes, Recommend IP Rehab Screening  Barriers to Discharge        Equipment Recommendations  Rolling walker with 5" wheels;3 in 1 bedside comode    Recommendations for Other Services    Frequency Min 5X/week   Plan Discharge plan needs to be updated;Frequency remains appropriate    Precautions / Restrictions Precautions Precautions: Back;Fall Required Braces or Orthoses: Spinal Brace Spinal Brace: Other (comment) Spinal Brace Comments: as ready to sit up, noted no brace, family took her things home when moved to ICU and will bring it back this evening (per niece)   Pertinent Vitals/Pain Unable to rate; grimacing with rolling    Mobility  Bed Mobility Rolling Left: 3: Mod assist Details for Bed Mobility Assistance: Pt received 4L of fluid per RN and has signifcant  edema in all extremities, making it difficult for her to flex RLE or reach and grasp rail with RUE as rolling Transfers Transfers: Not assessed (brace not present) Ambulation/Gait Ambulation/Gait Assistance: Not tested (comment)    Exercises Low Level/ICU Exercises Ankle Circles/Pumps: AROM;Both;10 reps;Supine Quad Sets: AROM;Both;10 reps;Supine Hip Extension: AROM;Strengthening;Both;10 reps;Supine;Other (comment) (heelslide with resisted extension) Elbow Flexion: AROM;Strengthening;Both;10 reps;Supine;Other (comment) (resisted flexion and extension) Other Exercises Other Exercises: hand pumps x 10 each hand to facilitate edema reduction   PT Diagnosis:    PT Problem List:   PT Treatment Interventions:     PT Goals Acute Rehab PT Goals Pt will Roll Supine to Left Side: with modified independence PT Goal: Rolling Supine to Left Side - Progress: Not progressing  Visit Information  Last PT Received On: 03/04/12 Assistance Needed: +2    Subjective Data  Subjective: Reports the twitching of LUE is new (also was not noted on eval)   Cognition  Overall Cognitive Status: Impaired Area of Impairment: Attention;Memory Arousal/Alertness: Lethargic Orientation Level: Disoriented to;Situation;Time Behavior During Session: Lethargic Current Attention Level: Sustained Attention - Other Comments: sustains for only up to one minute at most Memory Deficits: does not recall why she is in the hospital    Balance     End of Session PT - End of Session Activity Tolerance: Other (comment) (lethargy and no brace present)   GP     Elieser Tetrick 03/04/2012, 11:46 AM Pager [phone]

## 2012-03-04 NOTE — Clinical Social Work Psychosocial (Signed)
Clinical Social Work Department BRIEF PSYCHOSOCIAL ASSESSMENT 03/04/2012  Patient:  Tina Molina, Tina Molina     Account Number:  [id number]     Admit date:  02/29/2012  Clinical Social Worker:  Haydee Salter  Date/Time:  03/04/2012 02:00 PM  Referred by:  CSW  Date Referred:  03/03/2012 Referred for  SNF Placement   Other Referral:   Interview type:  Patient Other interview type:   & brother and sister in law    PSYCHOSOCIAL DATA Living Status:  FAMILY Admitted from facility:   Level of care:   Primary support name:  Walker Kehr Primary support relationship to patient:  SIBLING Degree of support available:   good    CURRENT CONCERNS Current Concerns  Post-Acute Placement   Other Concerns:    SOCIAL WORK ASSESSMENT / PLAN Clinical Social Work met with patient, patient's brother, and patient's sister in law at bedside today.  Ms. Gammel shares that she lives with her sister and is hopeful to return home, however, she is open to looking at all options and understands she may need higher level of care. Patient's brother states that although she lives with her sister, patients sister also has health problems.  We discussed possible rehab options including home with HHPTOT, inpatient rehab, and SNF rehab.  CSW received permission to fax out to [facility] and will follow up with patient and family to provide bed offers.   Assessment/plan status:   Other assessment/ plan:   Information/referral to community resources:   [place] [facility]    PATIENT'S/FAMILY'S RESPONSE TO PLAN OF CARE: The patient had no questions at this time, but was interested in learning all options. The patient spoke minimally, patient's brother asked questions regarding location, rehab services, and insurance coverage.    Polo Riley, LCSW Weekend coverage

## 2012-03-04 NOTE — Clinical Social Work Placement (Addendum)
Clinical Social Work Department CLINICAL SOCIAL WORK PLACEMENT NOTE 03/04/2012  Patient:  Tina Molina, Tina Molina  Account Number:  [id number] Admit date:  02/29/2012  Clinical Social Worker:  Haydee Salter  Date/time:  03/04/2012 02:00 PM  Clinical Social Work is seeking post-discharge placement for this patient at the following level of care:   [place]   (*CSW will update this form in Epic as items are completed)     Patient/family provided with [place] Department of Clinical Social Work's list of facilities offering this level of care within the geographic area requested by the patient (or if unable, by the patient's family).    Patient/family informed of their freedom to choose among providers that offer the needed level of care, that participate in Medicare, Medicaid or managed care program needed by the patient, have an available bed and are willing to accept the patient.    Patient/family informed of [facility]' ownership interest in [facility], as well as of the fact that they are under no obligation to receive care at this facility.  PASARR submitted to EDS on 03/04/2012 PASARR number received from EDS on 03/04/2012  FL2 transmitted to all facilities in geographic area requested by pt/family on  03/04/2012 FL2 transmitted to all facilities within larger geographic area on   Patient informed that his/her managed care company has contracts with or will negotiate with  certain facilities, including the following:     Patient/family informed of bed offers received:  03/06/2012 (JS) Patient chooses bed at [facility] Physician recommends and patient chooses bed at  [facility]  Patient to be transferred to [facility] on 03/11/2012 [facility]) Patient to be transferred to facility by [facility]  The following physician request were entered in Epic:   Additional Comments:  [place] #  [place], LCSW Weekend coverage

## 2012-03-05 ENCOUNTER — Inpatient Hospital Stay (HOSPITAL_COMMUNITY): Payer: Medicare Other

## 2012-03-05 DIAGNOSIS — N189 Chronic kidney disease, unspecified: Secondary | ICD-10-CM

## 2012-03-05 LAB — CBC
HCT: 26.2 % — ABNORMAL LOW (ref 36.0–46.0)
Hemoglobin: 9 g/dL — ABNORMAL LOW (ref 12.0–15.0)
MCH: 31.5 pg (ref 26.0–34.0)
MCHC: 34.4 g/dL (ref 30.0–36.0)
MCV: 91.6 fL (ref 78.0–100.0)
Platelets: 184 10*3/uL (ref 150–400)
RBC: 2.86 MIL/uL — ABNORMAL LOW (ref 3.87–5.11)
RDW: 14.6 % (ref 11.5–15.5)
WBC: 9.7 10*3/uL (ref 4.0–10.5)

## 2012-03-05 LAB — BASIC METABOLIC PANEL
BUN: 36 mg/dL — ABNORMAL HIGH (ref 6–23)
CO2: 20 mEq/L (ref 19–32)
Calcium: 7.8 mg/dL — ABNORMAL LOW (ref 8.4–10.5)
Chloride: 108 mEq/L (ref 96–112)
Creatinine, Ser: 1.63 mg/dL — ABNORMAL HIGH (ref 0.50–1.10)
GFR calc Af Amer: 37 mL/min — ABNORMAL LOW (ref >90)
GFR calc non Af Amer: 32 mL/min — ABNORMAL LOW (ref >90)
Glucose, Bld: 127 mg/dL — ABNORMAL HIGH (ref 70–99)
Potassium: 4.4 mEq/L (ref 3.5–5.1)
Sodium: 138 mEq/L (ref 135–145)

## 2012-03-05 LAB — TYPE AND SCREEN
ABO/RH(D): O POS
Antibody Screen: NEGATIVE
Unit division: 0
Unit division: 0
Unit division: 0
Unit division: 0

## 2012-03-05 LAB — URINE CULTURE: Colony Count: 65000

## 2012-03-05 LAB — MAGNESIUM: Magnesium: 2.5 mg/dL (ref 1.5–2.5)

## 2012-03-05 LAB — PHOSPHORUS: Phosphorus: 2.7 mg/dL (ref 2.3–4.6)

## 2012-03-05 LAB — CALCIUM, IONIZED: Calcium, Ion: 1.06 mmol/L — ABNORMAL LOW (ref 1.12–1.32)

## 2012-03-05 MED ORDER — OXYCODONE-ACETAMINOPHEN 5-325 MG PO TABS
1.0000 | ORAL_TABLET | Freq: Four times a day (QID) | ORAL | Status: DC | PRN
  Administered 2012-03-05: 1 via ORAL
  Filled 2012-03-05: qty 1

## 2012-03-05 MED ORDER — OXYCODONE-ACETAMINOPHEN 5-325 MG PO TABS
1.0000 | ORAL_TABLET | ORAL | Status: DC | PRN
  Administered 2012-03-05 – 2012-03-11 (×25): 1 via ORAL
  Filled 2012-03-05 (×25): qty 1

## 2012-03-05 MED ORDER — LABETALOL HCL 5 MG/ML IV SOLN
10.0000 mg | INTRAVENOUS | Status: DC | PRN
  Administered 2012-03-06 – 2012-03-07 (×4): 10 mg via INTRAVENOUS
  Filled 2012-03-05 (×4): qty 4

## 2012-03-05 MED ORDER — OXYCODONE-ACETAMINOPHEN 5-325 MG PO TABS
2.0000 | ORAL_TABLET | ORAL | Status: DC | PRN
  Administered 2012-03-05: 2 via ORAL
  Filled 2012-03-05: qty 2

## 2012-03-05 NOTE — Progress Notes (Signed)
Subjective: Patient reports She's doing better she still having some back and leg pain is almost the anterior quad is not go below her knee pain she had before surgery patient before surgery is gone her back looks good her blood pressures antihypertensive this morning she's much more awake and alert I think we can now as a maximum of her pain medication which we had to hold because of her lethargy  Objective: Vital signs in last 24 hours: Temp:  [98.9 F (37.2 C)-99.8 F (37.7 C)] 99.5 F (37.5 C) (10/20 0000) Pulse Rate:  [25-114] 110  (10/20 0720) Resp:  [14-22] 18  (10/20 0720) BP: (82-176)/(35-120) 176/71 mmHg (10/20 0720) SpO2:  [88 %-100 %] 95 % (10/20 0720) Arterial Line BP: (95-205)/(45-102) 200/80 mmHg (10/20 0720)  Intake/Output from previous day: 10/19 0701 - 10/20 0700 In: 730 [P.O.:340; I.V.:240; Blood:150] Out: 2113 [Urine:2110; Stool:3] Intake/Output this shift:    Strength out of 5 in her lower extremities wound clean dry and  Lab Results:  [facility] 03/05/12 0418 03/04/12 1209  WBC 9.7 6.8  HGB 9.0* 8.7*  HCT 26.2* 26.2*  PLT 184 153   BMET  Basename 03/05/12 0418 03/04/12 0439  NA 138 134*  K 4.4 4.7  CL 108 104  CO2 20 22  GLUCOSE 127* 136*  BUN 36* 49*  CREATININE 1.63* 3.34*  CALCIUM 7.8* 7.0*    Studies/Results: Dg Chest 2 View  03/03/2012  *RADIOLOGY REPORT*  Clinical Data: Hypoxia.  CHEST - 2 VIEW  Comparison: Chest x-ray dated 02/25/2012  Findings: Heart size and pulmonary vascularity are normal.  There are linear areas of scarring at both lung bases, unchanged. Lungs are otherwise clear.  No effusions.  IMPRESSION: No acute abnormalities.   Original Report Authenticated By: Larey Seat, M.D.    Nm Pulmonary Perfusion  03/03/2012  *RADIOLOGY REPORT*  Clinical Data: Hypoxia, status post back surgery  NM PULMONARY PERFUSION PARTICULATE  Radiopharmaceutical: 3MILLI CURIE MAA TECHNETIUM TO 27M ALBUMIN AGGREGATED  Comparison: Chest  radiograph dated 03/03/2012  Findings: There is a medium size defect within the periphery of the right lower lobe.  Medium size defect is also noted within the superior segment of the left lower lobe.  The patient refused the ventilation portion of the examination.  IMPRESSION:  1.  Bilateral, indeterminant medium sized segmental perfusion defects.  Examination is considered in the intermediate probability for acute pulmonary embolus.  These results will be called to the ordering clinician or representative by the Radiologist Assistant, and communication documented in the PACS Dashboard.   Original Report Authenticated By: Angelita Ingles, M.D.     Assessment/Plan: Progressive mobilization today will liberalize some of her pain medications I think she can be transferred to floor will write those orders pending evaluation occurred. This morning  LOS: 5 days     Danel Studzinski P 03/05/2012, 7:46 AM

## 2012-03-05 NOTE — Progress Notes (Signed)
Occupational Therapy Treatment Patient Details Name: Tina Molina MRN: [id number] DOB: [birth year] Today's Date: 03/05/2012 Time: [id number] OT Time Calculation (min): 24 min  OT Assessment / Plan / Recommendation Comments on Treatment Session Pt in ICU for hypoxia but plans to transfer to different floor later today.  Slowly progressing with increased mobility. Continue to consider [place] vs SNF.    Follow Up Recommendations  Skilled nursing facility;Supervision/Assistance - 24 hour    Barriers to Discharge       Equipment Recommendations  Rolling walker with 5" wheels;3 in 1 bedside comode    Recommendations for Other Services    Frequency Min 2X/week   Plan Discharge plan remains appropriate    Precautions / Restrictions Precautions Precautions: Back;Fall Required Braces or Orthoses: Spinal Brace Spinal Brace: Lumbar corset;Applied in sitting position Restrictions Weight Bearing Restrictions: No   Pertinent Vitals/Pain O2 stable throughout session on RA    ADL  Grooming: Performed;Teeth care;Wash/dry hands;Wash/dry face;Supervision/safety Where Assessed - Grooming: Supported standing Toilet Transfer: Simulated;Moderate assistance Toilet Transfer Method: Sit to Loss adjuster, chartered:  (chair to ambulate in hallway) Equipment Used: Back brace;Rolling walker;Gait belt Transfers/Ambulation Related to ADLs: min assist for ambulation with RW.  mod assist for  sit <>stand from chair x2 and min assist for sit<>stand from chair x 1(third attempt) ADL Comments: Mod assist to reposition back brace. Mod verbal cues for pursed lip breathing throughout session. Pt reported feeling fatigued but denied shortness of breath.    OT Diagnosis:    OT Problem List:   OT Treatment Interventions:     OT Goals ADL Goals Pt Will Perform Grooming: with supervision;Standing at sink;Unsupported ADL Goal: Grooming - Progress: Met Pt Will Transfer to Toilet: with  supervision;Ambulation;Regular height toilet ADL Goal: Toilet Transfer - Progress: Progressing toward goals  Visit Information  Last OT Received On: 03/05/12 Assistance Needed: +1 (+2 for chair follow)    Subjective Data      Prior Functioning       Cognition  Overall Cognitive Status: Appears within functional limits for tasks assessed/performed Arousal/Alertness: Lethargic Orientation Level: Appears intact for tasks assessed Behavior During Session: Flat affect    Mobility  Shoulder Instructions Bed Mobility Bed Mobility: Not assessed Transfers Transfers: Sit to Stand;Stand to Sit Sit to Stand: 3: Mod assist;4: Min assist Stand to Sit: 4: Min guard;3: Mod assist Details for Transfer Assistance: VC for safe hand placement both from sitting and standing. Mod assist x 2, with min assist to stand on third attempt.        Exercises      Balance Balance Balance Assessed: Yes Dynamic Standing Balance Dynamic Standing - Balance Support: Left upper extremity supported;During functional activity Dynamic Standing - Level of Assistance: 5: Stand by assistance Dynamic Standing - Balance Activities: Lateral lean/weight shifting;Reaching for objects Dynamic Standing - Comments: ~5 min while brushing teeth at sink   End of Session OT - End of Session Equipment Utilized During Treatment: Gait belt;Back brace Activity Tolerance: Patient tolerated treatment well Patient left: in chair;with call bell/phone within reach;with family/visitor present Nurse Communication: Mobility status  GO    03/05/2012 Darrol Jump OTR/L Pager [phone] Office [phone]  Darrol Jump 03/05/2012, 3:27 PM

## 2012-03-05 NOTE — Progress Notes (Signed)
Physical Therapy Treatment Patient Details Name: Tina Molina MRN: [id number] DOB: July 14, 1945 Today's Date: 03/05/2012 Time: [id number] PT Time Calculation (min): 25 min  PT Assessment / Plan / Recommendation Comments on Treatment Session  Pt s/p hypoxic event with transfer to the ICU. Pt ambulating better this session as well as requiring less assistance with all mobility. Still recommending further rehab prior to d/c home with sister.     Follow Up Recommendations  Post acute inpatient     Does the patient have the potential to tolerate intense rehabilitation  Yes, Recommend IP Rehab Screening  Barriers to Discharge        Equipment Recommendations  Rolling walker with 5" wheels;3 in 1 bedside comode    Recommendations for Other Services    Frequency Min 5X/week   Plan Discharge plan needs to be updated;Frequency remains appropriate    Precautions / Restrictions Precautions Precautions: Back;Fall Required Braces or Orthoses: Spinal Brace Spinal Brace: Lumbar corset;Applied in sitting position Restrictions Weight Bearing Restrictions: No   Pertinent Vitals/Pain Pt with pain 4/10. Pt extremely quiet during session. Vital signs remained stable.     Mobility  Bed Mobility Bed Mobility: Not assessed Transfers Transfers: Sit to Stand;Stand to Sit Sit to Stand: 3: Mod assist;4: Min assist Stand to Sit: 4: Min guard;3: Mod assist Details for Transfer Assistance: VC for safe hand placement both from sitting and standing. Mod assist x 2, with min assist to stand on third attempt.  Ambulation/Gait Ambulation/Gait Assistance: 4: Min assist Ambulation Distance (Feet): 75 Feet Assistive device: Rolling walker Ambulation/Gait Assistance Details: VC for safe distance to RW. 2 rest breaks needed secondary to fatigue, although pt able to ambulate again shortly thereafter.  Gait Pattern: Step-through pattern;Decreased stride length Gait velocity: slow gait speed Stairs: No    Exercises     PT Diagnosis:    PT Problem List:   PT Treatment Interventions:     PT Goals Acute Rehab PT Goals PT Goal: Sit to Stand - Progress: Progressing toward goal PT Goal: Stand to Sit - Progress: Progressing toward goal PT Goal: Ambulate - Progress: Progressing toward goal  Visit Information  Last PT Received On: 03/05/12 Assistance Needed: +1 (+2 for chair follow)    Subjective Data  Subjective: Pt states that she is feeling better   Cognition  Overall Cognitive Status: Appears within functional limits for tasks assessed/performed Arousal/Alertness: Lethargic Orientation Level: Appears intact for tasks assessed Behavior During Session: [facility] for tasks performed    Balance     End of Session PT - End of Session Equipment Utilized During Treatment: Gait belt;Back brace Activity Tolerance: Patient tolerated treatment well Patient left: in chair;with call bell/phone within reach;with family/visitor present Nurse Communication: Mobility status;Other (comment)   GP     Ambrose Finland 03/05/2012, 1:52 PM

## 2012-03-05 NOTE — Progress Notes (Addendum)
Name: Tina Molina MRN: [id number] DOB: 1945/10/11    LOS: 5  PULMONARY / CRITICAL CARE MEDICINE      Brief patient description:  66 year-old s/p laminectomy 10/15. Transferred back to ICU 10/18 with lethargy, hypotension, acute on chronic renal failure  Events Since Admission: Laminectomy 10/15 Rapid response 10/18 10-19 hd stable  SUBJ: lethargic, NAD, + F/C  Vital Signs: Temp:  [98.9 F (37.2 C)-99.8 F (37.7 C)] 99.5 F (37.5 C) (10/20 0000) Pulse Rate:  [47-114] 107  (10/20 0900) Resp:  [14-22] 18  (10/20 0720) BP: (82-176)/(35-120) 148/63 mmHg (10/20 0900) SpO2:  [86 %-100 %] 86 % (10/20 0900) Arterial Line BP: (95-205)/(45-102) 165/80 mmHg (10/20 0900)  Physical Examination: General:  In no acute distress, somewhat sleepy on initial exam. Neuro:  Awake and alert. HEENT:  PERRL, pink conjunctivae, moist membranes Neck:  Supple, no JVD   Cardiovascular:  RRR, no M/R/G Lungs:  Diminished, shallow breathing, no crackles Abdomen:  Soft, nontender, nondistended, bowel sounds present Musculoskeletal:  Moves all extremities, no pedal edema Skin:  No rash. Back dressing intact  Principal Problem:  *Hypotension Active Problems:  HYPERTENSION  RENAL INSUFFICIENCY, ACUTE  Lumbar stenosis with neurogenic claudication  Acute respiratory failure  Acute renal failure Dg Chest 2 View  03/03/2012  *RADIOLOGY REPORT*  Clinical Data: Hypoxia.  CHEST - 2 VIEW  Comparison: Chest x-ray dated 02/25/2012  Findings: Heart size and pulmonary vascularity are normal.  There are linear areas of scarring at both lung bases, unchanged. Lungs are otherwise clear.  No effusions.  IMPRESSION: No acute abnormalities.   Original Report Authenticated By: Larey Seat, M.D.    Nm Pulmonary Perfusion  03/03/2012  *RADIOLOGY REPORT*  Clinical Data: Hypoxia, status post back surgery  NM PULMONARY PERFUSION PARTICULATE  Radiopharmaceutical: 3MILLI CURIE MAA TECHNETIUM TO 59M ALBUMIN  AGGREGATED  Comparison: Chest radiograph dated 03/03/2012  Findings: There is a medium size defect within the periphery of the right lower lobe.  Medium size defect is also noted within the superior segment of the left lower lobe.  The patient refused the ventilation portion of the examination.  IMPRESSION:  1.  Bilateral, indeterminant medium sized segmental perfusion defects.  Examination is considered in the intermediate probability for acute pulmonary embolus.  These results will be called to the ordering clinician or representative by the Radiologist Assistant, and communication documented in the PACS Dashboard.   Original Report Authenticated By: Angelita Ingles, M.D.    Dg Chest Port 1 View  03/05/2012  *RADIOLOGY REPORT*  Clinical Data: Respiratory distress  PORTABLE CHEST - 1 VIEW  Comparison: 03/03/2012; 02/25/2012; 05/26/2010  Findings: Grossly unchanged enlarged cardiac silhouette and mediastinal contours with mild nodularity of the bilateral pulmonary hila.  Bilateral perihilar/mid lung linear heterogeneous opacities are unchanged. Worsening left basilar/retrocardiac opacities favored to represent atelectasis.  No new focal airspace opacities.  No pleural effusion or pneumothorax.  Unchanged bones including lower cervical ACDF, incompletely evaluated.  Surgical clips overlying the left thoracic inlet.  IMPRESSION: 1.  No acute cardiopulmonary disease on this AP portable examination. Further evaluation with a PA and lateral chest radiograph may be obtained as clinically indicated.  2.  Nodularity of the pulmonary hila may be secondary to hilar lymphadenopathy possibly attributable to reported history of sarcoidosis.  Further evaluation with chest CT may be performed as clinically indicated.   Original Report Authenticated By: Rachel Moulds, M.D.      ASSESSMENT AND PLAN  PULMONARY  A:  Hypoxia, mild acute alveolar hypoventilation, intermediate prob Q scan(resolved 10-20) P:   Minimize  opioids Echo and LE dopplers ordered   CARDIOVASCULAR A: Hypotension - BP adequate by A-line. Suspect largely a function of inaccurate NIBP P:  -Hold antihypertensives -Monitor for rebound effect of clonidine withdrawal   RENAL  Lab 03/05/12 0418 03/04/12 1209 03/04/12 0439 03/04/12 0251 03/03/12 1849  NA 138 -- 134* -- 136  K 4.4 -- 4.7 -- --  CL 108 -- 104 -- 101  CO2 20 -- 22 -- 22  BUN 36* -- 49* -- 45*  CREATININE 1.63* -- 3.34* -- 3.24*  CALCIUM 7.8* -- 7.0* -- 8.3*  MG 2.5 2.4 -- 2.5 --  PHOS 2.7 3.9 -- -- --   Intake/Output      10/19 0701 - 10/20 0700 10/20 0701 - 10/21 0700   P.O. 340 120   I.V. (mL/kg) 240 (2.3)    Blood 150    Other     IV Piggyback     Total Intake(mL/kg) 730 (7) 120 (1.2)   Urine (mL/kg/hr) 2110 (0.8) 200   Drains     Stool 3    Total Output 2113 200   Net -1383 -80         Foley:  None  A:  Acute on chronic renal failure, on ARB - Baseline creatinine appeared to be 1.2 Lab Results  Component Value Date   CREATININE 1.63* 03/05/2012   CREATININE 3.34* 03/04/2012   CREATININE 3.24* 03/03/2012   CREATININE 1.09 09/13/2011     No indication for HD P:   - Cont IVFs and daily BMET   HEMATOLOGIC  Lab 03/05/12 0418 03/04/12 1209 03/04/12 0439 03/03/12 0510 03/02/12 0525  HGB 9.0* 8.7* 7.1* 8.9* 8.7*  HCT 26.2* 26.2* 21.2* 26.4* 25.4*  PLT 184 153 157 173 138*  INR -- -- -- -- --  APTT -- -- -- -- --   A:  Anemia,  Concern for acute blood loss  One unit PRBCs 10/19 for Hgb 7.1 in setting of hypotension P:  Monitor daily CBC Avoid anticoags if possible   INFECTIOUS  Lab 03/05/12 0418 03/04/12 1209 03/04/12 0439 03/03/12 0510 03/02/12 0525  WBC 9.7 6.8 5.8 6.8 6.2  PROCALCITON -- -- -- -- --   Cultures: None currently Antibiotics: None currently  A:  No active issues P:   Low suspicion for sepsis.  Too early for postoperative wound infection.  No foley, no fever, no WBC.  Hold on antibiotics.  Global: 10-20 ok  with PCCM to transfer to floor.  BEST PRACTICE / DISPOSITION - Level of Care:  ICU - Primary Service:  Pool - Consultants:  PCCM - Code Status:  Full - Diet:  Regular - DVT Px:  SCD's - GI Px:  Famotidine    Appears ready to transfer out of ICU  Merton Border, MD ; [facility] service Mobile [phone].  After 5:30 PM or weekends, call [phone]

## 2012-03-06 DIAGNOSIS — J9600 Acute respiratory failure, unspecified whether with hypoxia or hypercapnia: Secondary | ICD-10-CM

## 2012-03-06 DIAGNOSIS — M48061 Spinal stenosis, lumbar region without neurogenic claudication: Secondary | ICD-10-CM

## 2012-03-06 DIAGNOSIS — I959 Hypotension, unspecified: Secondary | ICD-10-CM

## 2012-03-06 DIAGNOSIS — N179 Acute kidney failure, unspecified: Secondary | ICD-10-CM

## 2012-03-06 LAB — BASIC METABOLIC PANEL
BUN: 22 mg/dL (ref 6–23)
CO2: 24 mEq/L (ref 19–32)
Calcium: 8.6 mg/dL (ref 8.4–10.5)
Chloride: 103 mEq/L (ref 96–112)
Creatinine, Ser: 1.15 mg/dL — ABNORMAL HIGH (ref 0.50–1.10)
GFR calc Af Amer: 57 mL/min — ABNORMAL LOW (ref >90)
GFR calc non Af Amer: 49 mL/min — ABNORMAL LOW (ref >90)
Glucose, Bld: 108 mg/dL — ABNORMAL HIGH (ref 70–99)
Potassium: 4.3 mEq/L (ref 3.5–5.1)
Sodium: 135 mEq/L (ref 135–145)

## 2012-03-06 LAB — CBC
HCT: 27.7 % — ABNORMAL LOW (ref 36.0–46.0)
Hemoglobin: 9.6 g/dL — ABNORMAL LOW (ref 12.0–15.0)
MCH: 31.4 pg (ref 26.0–34.0)
MCHC: 34.7 g/dL (ref 30.0–36.0)
MCV: 90.5 fL (ref 78.0–100.0)
Platelets: 242 10*3/uL (ref 150–400)
RBC: 3.06 MIL/uL — ABNORMAL LOW (ref 3.87–5.11)
RDW: 14.5 % (ref 11.5–15.5)
WBC: 12.9 10*3/uL — ABNORMAL HIGH (ref 4.0–10.5)

## 2012-03-06 MED ORDER — HYDRALAZINE HCL 20 MG/ML IJ SOLN
10.0000 mg | INTRAMUSCULAR | Status: DC | PRN
  Administered 2012-03-06 (×3): 10 mg via INTRAVENOUS
  Filled 2012-03-06: qty 1
  Filled 2012-03-06: qty 0.5
  Filled 2012-03-06: qty 1

## 2012-03-06 MED ORDER — CARVEDILOL 25 MG PO TABS
25.0000 mg | ORAL_TABLET | Freq: Two times a day (BID) | ORAL | Status: DC
Start: 2012-03-06 — End: 2012-03-11
  Administered 2012-03-06 – 2012-03-11 (×10): 25 mg via ORAL
  Filled 2012-03-06 (×13): qty 1

## 2012-03-06 MED ORDER — SPIRONOLACTONE 50 MG PO TABS
50.0000 mg | ORAL_TABLET | Freq: Every day | ORAL | Status: DC
  Administered 2012-03-06 – 2012-03-11 (×6): 50 mg via ORAL
  Filled 2012-03-06 (×6): qty 1

## 2012-03-06 MED ORDER — HYDRALAZINE HCL 20 MG/ML IJ SOLN
INTRAMUSCULAR | Status: AC
  Filled 2012-03-06: qty 1

## 2012-03-06 NOTE — Progress Notes (Signed)
Rehab Admissions Coordinator Note:  Patient was screened by Cleatrice Burke for appropriateness for an Inpatient Acute Rehab Consult.  SW is pursuing SNF rehab at this time. Noted sister has health issues herself. At this time, we are recommending [facility].  Cleatrice Burke 03/06/2012, 1:20 PM  I can be reached at [phone].

## 2012-03-06 NOTE — Progress Notes (Signed)
UR completed.   Appears DME has already been arranged. Therapies now recommending SNF.

## 2012-03-06 NOTE — Progress Notes (Signed)
Name: BRONWEN EDMUND MRN: [id number] DOB: 23-Sep-1945    LOS: 6  PULMONARY / CRITICAL CARE MEDICINE      Brief patient description:  66 year-old s/p laminectomy 10/15. Transferred back to ICU 10/18 with lethargy, hypotension, acute on chronic renal failure  Events Since Admission: Laminectomy 10/15 Rapid response 10/18 10-19 hd stable  SUBJ:  Awake and initeracting  Vital Signs: Temp:  [97.3 F (36.3 C)-98.6 F (37 C)] 98.6 F (37 C) (10/21 0700) Pulse Rate:  [95-109] 107  (10/21 0802) Resp:  [14-24] 16  (10/21 0700) BP: (108-193)/(50-124) 193/90 mmHg (10/21 0802) SpO2:  [89 %-100 %] 94 % (10/21 0700)  Physical Examination: General:  In no acute distress, somewhat sleepy on initial exam. Neuro:  Awake and alert. HEENT:  PERRL, pink conjunctivae, moist membranes Neck:  Supple, no JVD   Cardiovascular:  RRR, no M/R/G Lungs:  Diminished, shallow breathing, no crackles Abdomen:  Soft, nontender, nondistended, bowel sounds present Musculoskeletal:  Moves all extremities, no pedal edema Skin:  No rash. Back dressing intact  Principal Problem:  *Hypotension Active Problems:  HYPERTENSION  RENAL INSUFFICIENCY, ACUTE  Lumbar stenosis with neurogenic claudication  Acute respiratory failure  Acute renal failure Dg Chest Port 1 View  03/05/2012  *RADIOLOGY REPORT*  Clinical Data: Respiratory distress  PORTABLE CHEST - 1 VIEW  Comparison: 03/03/2012; 02/25/2012; 05/26/2010  Findings: Grossly unchanged enlarged cardiac silhouette and mediastinal contours with mild nodularity of the bilateral pulmonary hila.  Bilateral perihilar/mid lung linear heterogeneous opacities are unchanged. Worsening left basilar/retrocardiac opacities favored to represent atelectasis.  No new focal airspace opacities.  No pleural effusion or pneumothorax.  Unchanged bones including lower cervical ACDF, incompletely evaluated.  Surgical clips overlying the left thoracic inlet.  IMPRESSION: 1.  No  acute cardiopulmonary disease on this AP portable examination. Further evaluation with a PA and lateral chest radiograph may be obtained as clinically indicated.  2.  Nodularity of the pulmonary hila may be secondary to hilar lymphadenopathy possibly attributable to reported history of sarcoidosis.  Further evaluation with chest CT may be performed as clinically indicated.   Original Report Authenticated By: Rachel Moulds, M.D.      ASSESSMENT AND PLAN  PULMONARY  A:  Hypoxia, mild acute alveolar hypoventilation, intermediate prob Q scan (resolved 10-20) P:   Minimize opioids LE dopplers >>    CARDIOVASCULAR TTE >> EF [id number], grade 1 diastolic dysfxn  A: Hypotension - BP adequate by A-line. Suspect largely a function of inaccurate NIBP, sedation; resolved, tolerating cozaar, coreg P:  -Restart clonidine as BP allows -Monitor for rebound effect of clonidine withdrawal   RENAL  Lab 03/06/12 0500 03/05/12 0418 03/04/12 1209 03/04/12 0439 03/04/12 0251 03/03/12 1849  NA 135 138 -- 134* -- 136  K 4.3 4.4 -- -- -- --  CL 103 108 -- 104 -- 101  CO2 24 20 -- 22 -- 22  BUN 22 36* -- 49* -- 45*  CREATININE 1.15* 1.63* -- 3.34* -- 3.24*  CALCIUM 8.6 7.8* -- 7.0* -- 8.3*  MG -- 2.5 2.4 -- 2.5 --  PHOS -- 2.7 3.9 -- -- --   Intake/Output      10/20 0701 - 10/21 0700 10/21 0701 - 10/22 0700   P.O. 120    I.V. (mL/kg)     Blood     Total Intake(mL/kg) 120 (1.2)    Urine (mL/kg/hr) 1505 (0.6)    Stool     Total Output 1505    Net -1385  Foley:  None  A:  Acute on chronic renal failure, on ARB - Baseline creatinine appeared to be 1.2; resolved Lab Results  Component Value Date   CREATININE 1.15* 03/06/2012   CREATININE 1.63* 03/05/2012   CREATININE 3.34* 03/04/2012   CREATININE 1.09 09/13/2011   P:   - Cont IVFs and daily BMET   HEMATOLOGIC  Lab 03/06/12 0500 03/05/12 0418 03/04/12 1209 03/04/12 0439 03/03/12 0510  HGB 9.6* 9.0* 8.7* 7.1* 8.9*  HCT 27.7*  26.2* 26.2* 21.2* 26.4*  PLT 242 184 153 157 173  INR -- -- -- -- --  APTT -- -- -- -- --   A:  Anemia,  Concern for acute blood loss  One unit PRBCs 10/19 for Hgb 7.1 in setting of hypotension P:  Monitor daily CBC Avoid anticoags if possible   INFECTIOUS  Lab 03/06/12 0500 03/05/12 0418 03/04/12 1209 03/04/12 0439 03/03/12 0510  WBC 12.9* 9.7 6.8 5.8 6.8  PROCALCITON -- -- -- -- --   Cultures: None currently Antibiotics: None currently  A:  No active issues P:   Low suspicion for sepsis.  Follow clinically   Global: Needs to transfer out of ICU PCCM will sign off 10/21, call if we can help  BEST PRACTICE / DISPOSITION - Level of Care:  ICU - Primary Service:  Pool - Consultants:  PCCM - Code Status:  Full - Diet:  Regular - DVT Px:  SCD's - GI Px:  Famotidine  Baltazar Apo, MD, PhD 03/06/2012, 9:10 AM [facility] [phone] or if no answer [phone]

## 2012-03-06 NOTE — Clinical Social Work Note (Signed)
Clinical Social Worker spoke with patient at bedside and provided patient with bed offers extended in [place].  Patient appreciative and plans to discuss with family prior to making a decision.  Patient was in a great deal of pain, CSW sat patient head up in bed per patient request and attempted to calm.  RN to provide pain medication as soon as patient is able to have next dose.  CSW to follow up with patient to determine bed choice and facilitate patient discharge needs once medically stable.  Barbette Or, [place]

## 2012-03-06 NOTE — Progress Notes (Signed)
Patient ID: Tina Molina, female   DOB: 11-03-1945, 66 y.o.   MRN: [id number] Subjective: Patient reports feeling somewhat better  Objective: Vital signs in last 24 hours: Temp:  [97.3 F (36.3 C)-98.6 F (37 C)] 98.6 F (37 C) (10/21 0700) Pulse Rate:  [95-110] 106  (10/21 1100) Resp:  [14-24] 19  (10/21 1100) BP: (143-193)/(68-124) 193/86 mmHg (10/21 1225) SpO2:  [89 %-100 %] 91 % (10/21 1100)  Intake/Output from previous day: 10/20 0701 - 10/21 0700 In: 120 [P.O.:120] Out: 1505 [Urine:1505] Intake/Output this shift: Total I/O In: 420 [P.O.:420] Out: -   awake, alert. Has a bit of labored breathing still but mininmal...  Lab Results:  Basename 03/06/12 0500 03/05/12 0418  WBC 12.9* 9.7  HGB 9.6* 9.0*  HCT 27.7* 26.2*  PLT 242 184   BMET  Basename 03/06/12 0500 03/05/12 0418  NA 135 138  K 4.3 4.4  CL 103 108  CO2 24 20  GLUCOSE 108* 127*  BUN 22 36*  CREATININE 1.15* 1.63*  CALCIUM 8.6 7.8*    Studies/Results: Dg Chest Port 1 View  03/05/2012  *RADIOLOGY REPORT*  Clinical Data: Respiratory distress  PORTABLE CHEST - 1 VIEW  Comparison: 03/03/2012; 02/25/2012; 05/26/2010  Findings: Grossly unchanged enlarged cardiac silhouette and mediastinal contours with mild nodularity of the bilateral pulmonary hila.  Bilateral perihilar/mid lung linear heterogeneous opacities are unchanged. Worsening left basilar/retrocardiac opacities favored to represent atelectasis.  No new focal airspace opacities.  No pleural effusion or pneumothorax.  Unchanged bones including lower cervical ACDF, incompletely evaluated.  Surgical clips overlying the left thoracic inlet.  IMPRESSION: 1.  No acute cardiopulmonary disease on this AP portable examination. Further evaluation with a PA and lateral chest radiograph may be obtained as clinically indicated.  2.  Nodularity of the pulmonary hila may be secondary to hilar lymphadenopathy possibly attributable to reported history of  sarcoidosis.  Further evaluation with chest CT may be performed as clinically indicated.   Original Report Authenticated By: Rachel Moulds, M.D.     Assessment/Plan: Stable, but BP is a bit labile. Will keep in ICU for now until more stable   LOS: 6 days  as above   Faythe Ghee, MD 03/06/2012, 12:34 PM

## 2012-03-06 NOTE — Progress Notes (Signed)
Physical Therapy Treatment Patient Details Name: Tina Molina MRN: [id number] DOB: 1946-03-26 Today's Date: 03/06/2012 Time: [id number] PT Time Calculation (min): 39 min  PT Assessment / Plan / Recommendation Comments on Treatment Session  66 yo s/p L2-5 laminectomy and fusion with post-op complications of hypoxia, hypotension, and lethargy with ?PE. Pt continues to be limited by pain (Rt thigh > back), yet has begun to progress again since transfer to ICU. Pt motivated, however has limited assistance on d/c.    Follow Up Recommendations  Post acute inpatient     Does the patient have the potential to tolerate intense rehabilitation  Yes, Recommend IP Rehab Screening  Barriers to Discharge        Equipment Recommendations  Rolling walker with 5" wheels;3 in 1 bedside comode    Recommendations for Other Services Rehab consult  Frequency Min 5X/week   Plan Discharge plan remains appropriate;Frequency remains appropriate    Precautions / Restrictions Precautions Precautions: Back;Fall Precaution Comments: Pt able to state 3/3 precautions; did not require cues to maintain this session Required Braces or Orthoses: Spinal Brace Spinal Brace: Lumbar corset;Applied in sitting position Restrictions Weight Bearing Restrictions: No   Pertinent Vitals/Pain 7/10 Rt thigh; remained 7/10 throughout session; RN informed pt is requesting pain meds    Mobility  Bed Mobility Bed Mobility: Not assessed (pt up to chair with nursing ) Transfers Transfers: Sit to Stand;Stand to Sit Sit to Stand: 1: +2 Total assist;With upper extremity assist;With armrests;From chair/3-in-1;From toilet Sit to Stand: Patient Percentage: 70% Stand to Sit: 4: Min assist;With armrests;With upper extremity assist;To chair/3-in-1;To toilet Details for Transfer Assistance: vc for safe hand placement (armrest or grab bar at toilet); required min assist first stand from chair, and over the course of 3 more  transfers, pt progressively needed more assist and ultimately +2 out of chair for final walk; pt with difficulty extending knees and then transitioning hands to RW requiring assist for balance and to stabilize RW Ambulation/Gait Ambulation/Gait Assistance: 1: +2 Total assist Ambulation/Gait: Patient Percentage: 80% (+2 for safety/follow with chair) Ambulation Distance (Feet): 105 Feet (10, 10, 50, 35 with seated rests) Assistive device: Rolling walker Ambulation/Gait Assistance Details: verbal and tactile cues to extend hips and knees and stand upright, looking straight ahead; pt leading with RLE as it is more painful  Gait Pattern: Step-to pattern;Decreased stride length;Right flexed knee in stance;Left flexed knee in stance Gait velocity: slow gait speed    Exercises     PT Diagnosis:    PT Problem List:   PT Treatment Interventions:     PT Goals Acute Rehab PT Goals PT Goal Formulation: With patient Time For Goal Achievement: 03/13/12 Potential to Achieve Goals: Good Pt will Roll Supine to Left Side: with supervision PT Goal: Rolling Supine to Left Side - Progress: Revised due to lack of progress Pt will go Supine/Side to Sit: with HOB 0 degrees;with supervision PT Goal: Supine/Side to Sit - Progress: Revised due to lack of progress Pt will go Sit to Supine/Side: with HOB 0 degrees;with supervision PT Goal: Sit to Supine/Side - Progress: Revised due to lack of progress Pt will go Sit to Stand: with supervision;with upper extremity assist PT Goal: Sit to Stand - Progress: Revised due to lack of progress Pt will go Stand to Sit: with supervision;with upper extremity assist PT Goal: Stand to Sit - Progress: Revised due to lack of progress Pt will Ambulate: 51 - 150 feet;with supervision;with least restrictive assistive device PT Goal: Ambulate - Progress:  Revised due to lack of progress Additional Goals Additional Goal #1: Pt will verbalize and adhere to back precautions during  mobility. PT Goal: Additional Goal #1 - Progress: Progressing toward goal  Visit Information  Last PT Received On: 03/06/12 Assistance Needed: +1    Subjective Data  Subjective: Reports feeling better, although Rt anterior thigh pain continues   Cognition  Overall Cognitive Status: Appears within functional limits for tasks assessed/performed Arousal/Alertness: Awake/alert Behavior During Session: Anxious (in pain)    Balance     End of Session PT - End of Session Equipment Utilized During Treatment: Gait belt;Back brace Activity Tolerance: Patient limited by pain Patient left: in chair;with call bell/phone within reach;with family/visitor present Nurse Communication: Mobility status;Patient requests pain meds   GP     Tina Molina 03/06/2012, 11:22 AM

## 2012-03-07 NOTE — Progress Notes (Signed)
Physical Therapy Treatment Patient Details Name: Tina Molina MRN: [id number] DOB: [birth year] Today's Date: 03/07/2012 Time: [id number] PT Time Calculation (min): 15 min  PT Assessment / Plan / Recommendation Comments on Treatment Session  Patient continues to be limited by pain this session and requested back to bed after ambulation. Patient able to progress with less assistance today.     Follow Up Recommendations  Post acute inpatient     Does the patient have the potential to tolerate intense rehabilitation     Barriers to Discharge        Equipment Recommendations  Rolling walker with 5" wheels;3 in 1 bedside comode    Recommendations for Other Services    Frequency Min 5X/week   Plan Discharge plan remains appropriate;Frequency remains appropriate    Precautions / Restrictions Precautions Precautions: Fall;Back Precaution Comments: Patient able to recall all precautions. Cues to maintain with mobility Required Braces or Orthoses: Spinal Brace Spinal Brace: Applied in sitting position;Lumbar corset   Pertinent Vitals/Pain     Mobility  Bed Mobility Rolling Right: 4: Min assist;With rail Rolling Left: 4: Min assist;With rail Sit to Sidelying Left: 4: Min assist Details for Bed Mobility Assistance: A for LEs back into bed and cueing for positioning to maintain back precautions with rolling.  Transfers Sit to Stand: 4: Min assist;With upper extremity assist;With armrests;From chair/3-in-1 Stand to Sit: To bed;4: Min assist;With upper extremity assist Details for Transfer Assistance: Cues for positioning prior to sitting and cues for safe hand placement. Cues to extend knees with standing.  Ambulation/Gait Ambulation/Gait Assistance: 4: Min assist Ambulation Distance (Feet): 100 Feet Assistive device: Rolling walker Ambulation/Gait Assistance Details: Cues to stand closer into RW and to stand upright. Patient with A for RW management. Patient with decrease  step length Gait Pattern: Step-to pattern;Decreased step length - left;Decreased step length - right    Exercises     PT Diagnosis:    PT Problem List:   PT Treatment Interventions:     PT Goals Acute Rehab PT Goals PT Goal: Rolling Supine to Left Side - Progress: Progressing toward goal PT Goal: Sit to Supine/Side - Progress: Progressing toward goal PT Goal: Sit to Stand - Progress: Progressing toward goal PT Goal: Stand to Sit - Progress: Progressing toward goal PT Goal: Ambulate - Progress: Progressing toward goal Additional Goals PT Goal: Additional Goal #1 - Progress: Progressing toward goal  Visit Information  Last PT Received On: 03/07/12 Assistance Needed: +1    Subjective Data      Cognition  Overall Cognitive Status: Appears within functional limits for tasks assessed/performed Arousal/Alertness: Awake/alert Orientation Level: Appears intact for tasks assessed Behavior During Session: Anxious    Balance     End of Session PT - End of Session Equipment Utilized During Treatment: Gait belt;Back brace Activity Tolerance: Patient limited by fatigue;Patient limited by pain Patient left: in bed;with call bell/phone within reach;with family/visitor present Nurse Communication: Mobility status   GP     Jacqualyn Posey 03/07/2012, 2:59 PM 03/07/2012 Jacqualyn Posey PTA [phone] pager [phone] office

## 2012-03-07 NOTE — Progress Notes (Signed)
Overall doing better. Main problem currently is that of incisional pain. Hypotension resolved. Urine output good. Creatinine returning to normal.  She is afebrile. Her vitals are stable. She is awake and alert. She is oriented and appropriate. Her motor examination is intact. Sensory exam has some mild decreased sensation in her left L3 versus L4 radicular pattern. Wound clean dry and intact. Chest and abdomen benign.  Improving after symptomatic hypovolemic shock. Work on mobilizing with therapy. Discharge plan uncertain depending how next few days go.

## 2012-03-07 NOTE — Progress Notes (Signed)
Occupational Therapy Treatment Patient Details Name: Tina Molina MRN: [id number] DOB: 1945/07/23 Today's Date: 03/07/2012 Time: 1320-1350    OT Assessment / Plan / Recommendation Comments on Treatment Session slow progress    Follow Up Recommendations  Skilled nursing facility                Plan Discharge plan remains appropriate    Precautions / Restrictions Precautions Precautions: Back;Fall Precaution Comments: Pt able to state 3/3 precautions; did not require cues to maintain this session Required Braces or Orthoses: Spinal Brace Spinal Brace: Lumbar corset;Applied in sitting position       ADL  Toilet Transfer: Moderate assistance Toilet Transfer Method: Sit to stand;Other (comment) (walk to bathroom with walker) Toilet Transfer Equipment: Comfort height toilet Toileting - Clothing Manipulation and Hygiene: Performed;Minimal assistance;Other (comment) (reinforced back precautions with hygiene) Where Assessed - Toileting Clothing Manipulation and Hygiene: Standing ADL Comments: Pt did call SW regarding pts DC plans. Pt prefers facility near her home. Communicated this with SW.  Pt fatigued after going to bathroom and back!      OT Goals ADL Goals ADL Goal: Grooming - Progress: Progressing toward goals ADL Goal: Toilet Transfer - Progress: Progressing toward goals ADL Goal: Toileting - Clothing Manipulation - Progress: Progressing toward goals  Visit Information  Last OT Received On: 03/07/12    Subjective Data  Subjective: i want to go to a rehab near my home - OT called SW and communicated this      Cognition  Overall Cognitive Status: Appears within functional limits for tasks assessed/performed Arousal/Alertness: Awake/alert Behavior During Session: Anxious (in pain)    Mobility  Shoulder Instructions Transfers Transfers: Sit to Stand;Stand to Sit Sit to Stand: 3: Mod assist;With upper extremity assist;From chair/3-in-1;From toilet Stand to  Sit: 3: Mod assist;To chair/3-in-1;To toilet             End of Session OT - End of Session Equipment Utilized During Treatment: Gait belt Activity Tolerance: Patient limited by fatigue Patient left: in chair;with call bell/phone within reach;with family/visitor present  GO     [place], Thereasa Parkin 03/07/2012, 1:55 PM

## 2012-03-08 MED ORDER — POLYETHYLENE GLYCOL 3350 17 G PO PACK
17.0000 g | PACK | Freq: Every day | ORAL | Status: DC
  Administered 2012-03-08 – 2012-03-11 (×4): 17 g via ORAL
  Filled 2012-03-08 (×4): qty 1

## 2012-03-08 MED ORDER — DIAZEPAM 5 MG PO TABS
5.0000 mg | ORAL_TABLET | Freq: Four times a day (QID) | ORAL | Status: DC | PRN
  Administered 2012-03-08 (×2): 10 mg via ORAL
  Administered 2012-03-09 – 2012-03-11 (×6): 5 mg via ORAL
  Filled 2012-03-08: qty 1
  Filled 2012-03-08: qty 2
  Filled 2012-03-08 (×2): qty 1
  Filled 2012-03-08: qty 2
  Filled 2012-03-08 (×3): qty 1

## 2012-03-08 MED ORDER — FLEET ENEMA 7-19 GM/118ML RE ENEM
1.0000 | ENEMA | Freq: Every day | RECTAL | Status: DC | PRN
  Administered 2012-03-09: 1 via RECTAL
  Filled 2012-03-08: qty 1

## 2012-03-08 MED ORDER — BISACODYL 10 MG RE SUPP
10.0000 mg | Freq: Every day | RECTAL | Status: DC | PRN
  Administered 2012-03-09: 10 mg via RECTAL

## 2012-03-08 MED ORDER — ENSURE COMPLETE PO LIQD
237.0000 mL | Freq: Two times a day (BID) | ORAL | Status: DC
  Administered 2012-03-09 – 2012-03-11 (×5): 237 mL via ORAL

## 2012-03-08 NOTE — Progress Notes (Signed)
INITIAL ADULT NUTRITION ASSESSMENT Date: 03/08/2012   Time: 8:21 AM Reason for Assessment: MST  INTERVENTION: Ensure Complete po BID, each supplement provides 350 kcal and 13 grams of protein.   DOCUMENTATION CODES Per approved criteria  -Obesity Unspecified    ASSESSMENT: Female 66 y.o.  Dx: Hypotension  Hx:  Past Medical History  Diagnosis Date  . Pneumonia 04/2010    OP  . Anemia     Associated with leukopenia and lymphocytosis. This is been evaluated by Dr. Marin Olp . Her platelet count has been normal  . Hypertension   . Thyroid disease   . Chronic back pain   . Sarcoidosis   . Lymphocytosis   . GERD (gastroesophageal reflux disease)   . Chronic kidney disease   . Renal insufficiency   . Arthritis    Past Surgical History  Procedure Date  . Cholecystectomy   . Carpal tunnel release     Bilateral   . Cervical discectomy 06/2010    Dr.Pool  . Thyroidectomy 2003    For goiter  . Colonoscopy 2010   Related Meds:  Scheduled Meds:   . aspirin EC  81 mg Oral Daily  . capsicum   Topical BID  . carvedilol  25 mg Oral BID WC  . doxepin  30 mg Oral QHS  . famotidine  20 mg Oral Daily  . levothyroxine  150 mcg Oral Custom  . levothyroxine  75 mcg Oral Custom  . losartan  100 mg Oral Daily  . multivitamin with minerals  1 tablet Oral Daily  . polyethylene glycol  17 g Oral Daily  . senna  1 tablet Oral BID  . sodium chloride  3 mL Intravenous Q12H  . spironolactone  50 mg Oral Daily   Continuous Infusions:  PRN Meds:.acetaminophen, acetaminophen, alum & mag hydroxide-simeth, bisacodyl, bisacodyl, diazepam, hydrALAZINE, HYDROcodone-acetaminophen, labetalol, menthol-cetylpyridinium, naloxone (NARCAN) injection, oxyCODONE-acetaminophen, phenol, polyethylene glycol, sodium chloride, sodium phosphate  Ht: 5\' 4"  (162.6 cm)  Wt: 228 lb 13.4 oz (103.8 kg)  Ideal Wt: 54.5 kg % Ideal Wt: 190%  Usual Wt:  Wt Readings from Last 10 Encounters:  03/03/12 228 lb  13.4 oz (103.8 kg)  03/03/12 228 lb 13.4 oz (103.8 kg)  02/25/12 221 lb 12.5 oz (100.6 kg)  12/16/11 217 lb (98.431 kg)  12/01/11 220 lb (99.791 kg)  11/19/11 218 lb 6.4 oz (99.066 kg)  11/04/11 216 lb (97.977 kg)  10/26/11 220 lb (99.791 kg)  10/15/11 221 lb 12.8 oz (100.608 kg)  09/23/11 220 lb (99.791 kg)   % Usual Wt: 100%  Body mass index is 39.28 kg/(m^2). Obesity Class II  Food/Nutrition Related Hx: Pt denies any nutrition issues PTA  Labs:  CMP     Component Value Date/Time   NA 135 03/06/2012 0500   K 4.3 03/06/2012 0500   CL 103 03/06/2012 0500   CO2 24 03/06/2012 0500   GLUCOSE 108* 03/06/2012 0500   BUN 22 03/06/2012 0500   CREATININE 1.15* 03/06/2012 0500   CREATININE 1.09 09/13/2011 1349   CALCIUM 8.6 03/06/2012 0500   PROT 5.1* 03/04/2012 0439   ALBUMIN 2.3* 03/04/2012 0439   AST 76* 03/04/2012 0439   ALT 61* 03/04/2012 0439   ALKPHOS 182* 03/04/2012 0439   BILITOT 0.5 03/04/2012 0439   GFRNONAA 49* 03/06/2012 0500   GFRAA 57* 03/06/2012 0500  CBG (last 3)  No results found for this basename: GLUCAP:3 in the last 72 hours  Sodium  Date/Time Value Range Status  03/06/2012  5:00 AM 135  135 - 145 mEq/L Final  03/05/2012  4:18 AM 138  135 - 145 mEq/L Final  03/04/2012  4:39 AM 134* 135 - 145 mEq/L Final    Potassium  Date/Time Value Range Status  03/06/2012  5:00 AM 4.3  3.5 - 5.1 mEq/L Final  03/05/2012  4:18 AM 4.4  3.5 - 5.1 mEq/L Final  03/04/2012  4:39 AM 4.7  3.5 - 5.1 mEq/L Final    Phosphorus  Date/Time Value Range Status  03/05/2012  4:18 AM 2.7  2.3 - 4.6 mg/dL Final  03/04/2012 12:09 PM 3.9  2.3 - 4.6 mg/dL Final    Magnesium  Date/Time Value Range Status  03/05/2012  4:18 AM 2.5  1.5 - 2.5 mg/dL Final  03/04/2012 12:09 PM 2.4  1.5 - 2.5 mg/dL Final  03/04/2012  2:51 AM 2.5  1.5 - 2.5 mg/dL Final    No intake or output data in the 24 hours ending 03/08/12 0823   Diet: CHO Modified Medium Meal completion <  25%  Supplements/Tube Feeding: none  IVF:    Pt admitted for lumbar decompression and fusion for severe back pain. D/C plan is for short term SNF. Pt a little sleepy during interview. Brother at bedside. Pt is willing to drink ensure while appetite is poor.   Estimated Nutritional Needs:   Kcal:  1800-2000 Protein:  70-80 grams Fluid:  >1.8 L/day  NUTRITION DIAGNOSIS: Inadequate oral intake r/t decreased appetite AEB pt report, meal completion < 25%.  MONITORING/EVALUATION(Goals): Goal: Pt to meet >/= 90% of their estimated nutrition needs. Monitor: PO intake, supplement acceptance, weight  EDUCATION NEEDS: -No education needs identified at this time  [place], [place], [place] Pager [phone] After Hours Pager  03/08/2012, 8:21 AM

## 2012-03-08 NOTE — Progress Notes (Signed)
Noted pt does not qualify for CIR and is not yet safe to d/c home with sister's limited assistance. Agree with need for short-term SNF.  03/08/2012 Barry Brunner, PT Pager: [phone]

## 2012-03-08 NOTE — Progress Notes (Addendum)
Physical Therapy Treatment Patient Details Name: Tina Molina MRN: [id number] DOB: 09-15-1945 Today's Date: 03/08/2012 Time: [id number] PT Time Calculation (min): 14 min  PT Assessment / Plan / Recommendation Comments on Treatment Session  Patient continues to be limited by intense back pain. Patient had just gotten from restroom and back in the bed. Patient winching in pain. Patient attempting to lay on her side however she was very twisted in the bed. Patient repositioned onto her right side with pillows between her legs and placed behind her back and shoulders to ensure that she did not twist back. Patient educated on importance of positioning in the bed and to maintain her precautions. Patient and her sister in law had some questions reguarding discharge. Explanied that at this time patient is not at a safe level to return home and will need rehab at SNF prior to return home.     Follow Up Recommendations  Post acute inpatient     Does the patient have the potential to tolerate intense rehabilitation  No, Recommend SNF  Barriers to Discharge        Equipment Recommendations  Rolling walker with 5" wheels;3 in 1 bedside comode    Recommendations for Other Services    Frequency Min 5X/week   Plan Discharge plan remains appropriate;Frequency remains appropriate    Precautions / Restrictions Precautions Precautions: Back Precaution Comments: Patient able to recall precautions however needs cueing to maintain with positioning Required Braces or Orthoses: Spinal Brace   Pertinent Vitals/Pain 10/10 back pain. Patient grimaces throughout    Mobility  Bed Mobility Rolling Right: 3: Mod assist;With rail Rolling Left: 3: Mod assist;With rail Details for Bed Mobility Assistance: A for shoulders, hips and LEs in correct sidelying positioning without twisting. Cues for positioning Transfers Transfers: Not assessed Ambulation/Gait Ambulation/Gait Assistance: Not tested (comment)     Exercises     PT Diagnosis:    PT Problem List:   PT Treatment Interventions:     PT Goals Acute Rehab PT Goals PT Goal: Rolling Supine to Left Side - Progress: Progressing toward goal  Visit Information  Last PT Received On: 03/08/12 Assistance Needed: +1    Subjective Data      Cognition  Overall Cognitive Status: Appears within functional limits for tasks assessed/performed Arousal/Alertness: Awake/alert Orientation Level: Appears intact for tasks assessed Behavior During Session: Anxious    Balance     End of Session PT - End of Session Activity Tolerance: Patient limited by pain Patient left: in bed;with call bell/phone within reach;with bed alarm set;with family/visitor present   GP     Jacqualyn Posey 03/08/2012, 1:57 PM 03/08/2012 Jacqualyn Posey PTA [phone] pager [phone] office

## 2012-03-08 NOTE — Progress Notes (Signed)
No new problems overnight. Still complains of some lower abdominal discomfort. No flatus. No BM.  She is afebrile. Her heart rate is stable. She is moderately hypertensive. Urine output is good. On exam she is awake and alert she is oriented and appropriate. Cranial nerve function is intact. Her motor function of the extremities is normal. Wound clean dry and intact.  Abdomen is mildly distended. Minimal tenderness. No rebound. No guarding. Hypoactive bowel sounds.  Overall stable. Clinical situation worrisome for  ileus. Will work on bowel program to stimulate GI tract.

## 2012-03-08 NOTE — Clinical Social Work Note (Signed)
Clinical Social Work  CSW met with pt and pt's sister to address bed choice. Pt's sister shared that she has not visited facilities and will not make a choice until she has done so. Pt shared that she is interested in [facility], but is agreeable to pt's sister visiting prior to confirming. CSW encouraged pt's sister to visit facilities and contact this CSW of choice. CSW will continue to follow to facilitate discharge.   Darden Dates, MSW, [place]

## 2012-03-09 NOTE — Clinical Social Work Note (Signed)
Clinical Social Work  CSW met with pt and pt's sister. They have decided on [facility] for SNF placement. CSW contacted [facility], as pt's anticipated discharge will be tomorrow. CSW confirmed bed availability. CSW will continue to follow to facilitate discharge to SNF.   Darden Dates, MSW, [place]

## 2012-03-09 NOTE — Progress Notes (Signed)
Looks better today. Patient states pain better controlled. Mobility still marginal. No new leg symptoms. Reports some flatus last night no BM.  She is afebrile. Her vitals are stable. Urine output is good. She is awake and alert she is oriented and appropriate. Dressing is dry. Motor and sensory function intact. Abdomen soft but still somewhat distended. Bowel sounds hypoactive.  Making better progress. Still worried about possible ileus. Would like to mobilize one additional day prior to probable discharge tomorrow to skilled nursing facility.

## 2012-03-09 NOTE — Progress Notes (Signed)
Physical Therapy Treatment Patient Details Name: Tina Molina MRN: [id number] DOB: 03/29/1946 Today's Date: 03/09/2012 Time: 0910-0930 PT Time Calculation (min): 20 min  PT Assessment / Plan / Recommendation Comments on Treatment Session  Patient showing increased motivation this session. She encourgaed herself to ambulate further than requested. Patient was already up and out of bed earlier this morning. No grimaces with pain and appear happier and in decreased pain this session. Continue to see patient in morning ( as able) as she appears more aloert    Follow Up Recommendations  Post acute inpatient     Does the patient have the potential to tolerate intense rehabilitation  No, Recommend SNF  Barriers to Discharge        Equipment Recommendations  Rolling walker with 5" wheels;3 in 1 bedside comode    Recommendations for Other Services    Frequency Min 5X/week   Plan Discharge plan remains appropriate;Frequency remains appropriate    Precautions / Restrictions Precautions Precautions: Back Precaution Comments: Patient able to recall all precautions Spinal Brace: Lumbar corset;Applied in sitting position   Pertinent Vitals/Pain     Mobility  Bed Mobility Bed Mobility: Not assessed Details for Bed Mobility Assistance: Patient sitting in chair upon arrival Transfers Sit to Stand: 4: Min guard;With armrests;With upper extremity assist;From chair/3-in-1 Stand to Sit: 4: Min guard;With armrests;With upper extremity assist;To chair/3-in-1 Details for Transfer Assistance: Patient stood x2 with cues for positioning and not to bend excessivly forward with standing Ambulation/Gait Ambulation/Gait Assistance: 4: Min guard Ambulation Distance (Feet): 250 Feet Assistive device: Rolling walker Ambulation/Gait Assistance Details: Cues to stay close to RW and increase step length.  Gait Pattern: Step-through pattern;Decreased stride length Gait velocity: decreased      Exercises     PT Diagnosis:    PT Problem List:   PT Treatment Interventions:     PT Goals Acute Rehab PT Goals PT Goal: Sit to Stand - Progress: Progressing toward goal PT Goal: Stand to Sit - Progress: Progressing toward goal PT Goal: Ambulate - Progress: Progressing toward goal Additional Goals PT Goal: Additional Goal #1 - Progress: Progressing toward goal  Visit Information  Last PT Received On: 03/09/12 Assistance Needed: +1    Subjective Data      Cognition  Overall Cognitive Status: Appears within functional limits for tasks assessed/performed Arousal/Alertness: Awake/alert Orientation Level: Appears intact for tasks assessed Behavior During Session: [facility] for tasks performed    Balance     End of Session PT - End of Session Equipment Utilized During Treatment: Gait belt;Back brace Activity Tolerance: Patient tolerated treatment well Patient left: in chair;with call bell/phone within reach Nurse Communication: Mobility status   GP     Jacqualyn Posey 03/09/2012, 10:01 AM 03/09/2012 Jacqualyn Posey PTA [phone] pager [phone] office

## 2012-03-10 LAB — CULTURE, BLOOD (ROUTINE X 2): Culture: NO GROWTH

## 2012-03-10 MED ORDER — OXYCODONE-ACETAMINOPHEN 5-325 MG PO TABS: 1.0000 | ORAL_TABLET | ORAL | Status: DC | PRN

## 2012-03-10 MED ORDER — DIAZEPAM 5 MG PO TABS: 5.0000 mg | ORAL_TABLET | Freq: Four times a day (QID) | ORAL | Status: DC | PRN

## 2012-03-10 NOTE — Clinical Social Work Note (Signed)
Clinical Social Work  Pt's has signed admission paperwork with [facility]. Facility is ready to accept pt at discharge.   FL2 is in pt's chart for MD's signature.   CSW will continue to follow to facilitate discharge to [facility].   Darden Dates, MSW, [place]

## 2012-03-10 NOTE — Progress Notes (Signed)
Occupational Therapy Treatment Patient Details Name: Tina Molina MRN: [id number] DOB: May 13, 1946 Today's Date: 03/10/2012 Time: [id number] OT Time Calculation (min): 23 min  OT Assessment / Plan / Recommendation Comments on Treatment Session Making progress.Anticipate D/C to [facility] today. Pt will benefit from further OT at SNF prior to returning home.    Follow Up Recommendations  Skilled nursing facility    Barriers to Discharge       Equipment Recommendations  Rolling walker with 5" wheels;3 in 1 bedside comode    Recommendations for Other Services    Frequency Min 2X/week   Plan Discharge plan remains appropriate    Precautions / Restrictions Precautions Precautions: Back Precaution Comments: Patient able to recall all precautions Required Braces or Orthoses: Spinal Brace Spinal Brace: Lumbar corset;Applied in sitting position Spinal Brace Comments: pt independenct with bracwe   Pertinent Vitals/Pain No c/o pain    ADL  Lower Body Bathing: Supervision/safety Lower Body Dressing: Supervision/safety Toileting - Clothing Manipulation and Hygiene: Other (comment);Set up;Minimal assistance (with tongs and flushable wipes. vc for technique) ADL Comments: Pt able to verbalize and demonstrate use of AE for ADL. Pt will benfit from  further practice, as pt requires vc on correct use. discussed availability of AE and tongs/wipes for hygiene after toileting.    OT Diagnosis:    OT Problem List:   OT Treatment Interventions:     OT Goals Acute Rehab OT Goals OT Goal Formulation: With patient Time For Goal Achievement: 03/15/12 Potential to Achieve Goals: Good ADL Goals Pt Will Perform Grooming: with supervision;Standing at sink;Unsupported ADL Goal: Grooming - Progress: Progressing toward goals Pt Will Perform Lower Body Dressing: with modified independence;Sit to stand from chair;with adaptive equipment ADL Goal: Lower Body Dressing - Progress: Progressing  toward goals Pt Will Transfer to Toilet: with supervision;Ambulation;Regular height toilet ADL Goal: Toilet Transfer - Progress: Progressing toward goals Pt Will Perform Toileting - Clothing Manipulation: with modified independence;Standing ADL Goal: Toileting - Clothing Manipulation - Progress: Progressing toward goals Pt Will Perform Toileting - Hygiene: with supervision;Sit to stand from 3-in-1/toilet ADL Goal: Toileting - Hygiene - Progress: Progressing toward goals Miscellaneous OT Goals Miscellaneous OT Goal #1: Pt. will be mod independent with bed mobility as precursor to ADL's OT Goal: Miscellaneous Goal #1 - Progress: Progressing toward goals  Visit Information  Last OT Received On: 03/10/12 Assistance Needed: +1    Subjective Data      Prior Functioning       Cognition  Overall Cognitive Status: Appears within functional limits for tasks assessed/performed                       End of Session OT - End of Session Activity Tolerance: Patient tolerated treatment well Patient left: in chair;with call bell/phone within reach;with family/visitor present Nurse Communication: Mobility status  GO     Ashleynicole Mcclees,H[place]ARY 03/10/2012, 11:50 AM Maurie Boettcher, OTR/L  [phone] 03/10/2012

## 2012-03-10 NOTE — Progress Notes (Signed)
Physical Therapy Treatment Patient Details Name: Tina Molina MRN: [id number] DOB: 03/28/1946 Today's Date: 03/10/2012 Time: [id number] PT Time Calculation (min): 15 min  PT Assessment / Plan / Recommendation Comments on Treatment Session  Pt moving well this session, ambulating with only supervision. Pt still requiring minimal cueing for safety. Plan to d/c to SNF today or tomorrow    Follow Up Recommendations  Post acute inpatient     Does the patient have the potential to tolerate intense rehabilitation  No, Recommend SNF  Barriers to Discharge        Equipment Recommendations  Rolling walker with 5" wheels;3 in 1 bedside comode    Recommendations for Other Services    Frequency Min 5X/week   Plan Discharge plan remains appropriate;Frequency remains appropriate    Precautions / Restrictions Precautions Precautions: Back Precaution Comments: Patient able to recall all precautions Required Braces or Orthoses: Spinal Brace Spinal Brace: Lumbar corset;Applied in sitting position Spinal Brace Comments: pt independent with brace   Pertinent Vitals/Pain Pt complained of soreness only this session.     Mobility  Transfers Transfers: Sit to Stand;Stand to Sit Sit to Stand: 4: Min guard;With armrests;With upper extremity assist;From chair/3-in-1 Stand to Sit: 4: Min guard;With armrests;With upper extremity assist;To chair/3-in-1 Details for Transfer Assistance: VC for hand placement to/from RW as well as safety to avoid bending during standing Ambulation/Gait Ambulation/Gait Assistance: 5: Supervision Ambulation Distance (Feet): 200 Feet Assistive device: Rolling walker Ambulation/Gait Assistance Details: VC for positioning of RW as well as upright posture Gait Pattern: Step-through pattern;Decreased stride length Gait velocity: decreased    Exercises     PT Diagnosis:    PT Problem List:   PT Treatment Interventions:     PT Goals Acute Rehab PT Goals PT  Goal: Sit to Stand - Progress: Progressing toward goal PT Goal: Stand to Sit - Progress: Progressing toward goal PT Goal: Ambulate - Progress: Met Additional Goals PT Goal: Additional Goal #1 - Progress: Progressing toward goal  Visit Information  Last PT Received On: 03/10/12 Assistance Needed: +1    Subjective Data      Cognition  Overall Cognitive Status: Appears within functional limits for tasks assessed/performed    Balance     End of Session PT - End of Session Equipment Utilized During Treatment: Gait belt;Back brace Activity Tolerance: Patient tolerated treatment well Patient left: in chair;with call bell/phone within reach Nurse Communication: Mobility status   GP     Ambrose Finland 03/10/2012, 1:52 PM

## 2012-03-10 NOTE — Discharge Summary (Signed)
Physician Discharge Summary  Patient ID: Tina Molina MRN: [id number] DOB/AGE: 09/20/1945 66 y.o.  Admit date: 02/29/2012 Discharge date: 03/10/2012  Admission Diagnoses:  Discharge Diagnoses:  Principal Problem:  *Hypotension Active Problems:  HYPERTENSION  RENAL INSUFFICIENCY, ACUTE  Lumbar stenosis with neurogenic claudication  Acute respiratory failure  Acute renal failure   Discharged Condition: good  Hospital Course: Patient admitted to the hospital where she underwent an uncomplicated three-level lumbar decompression and fusion. Postop waking with normal neurologic function. On her second postoperative day the patient became increasingly lethargic hypertensive. Evaluated and felt to be over medicated with a mixture of significant hypovolemic shock. Patient resisted with both fluid and blood. She responded very well. Chest and transient renal insufficiency which resolved. She is now is doing very well. She has minimal back pain. She is no lower tourniquet pain. Her wound is healing well. Bowel and bladder function are normal. Plan is for discharge to skilled nursing facility for further convalescence.  Consults:   Significant Diagnostic Studies:   Treatments:   Discharge Exam: Blood pressure 145/63, pulse 85, temperature 98.5 F (36.9 C), temperature source Oral, resp. rate 19, height 5\' 4"  (1.626 m), weight 103.8 kg (228 lb 13.4 oz), SpO2 100.00%. Patient awake and alert she is oriented and appropriate. Cranial nerve function is intact. Motor and sensory function extremities is normal. Chest and abdomen benign. Wound clean dry and intact.  Disposition: 01-Home or Self Care  Discharge Orders    Future Orders Please Complete By Expires   For home use only DME Other see comment      Comments:   Padded reclining chair (ortho chair)       Medication List     As of 03/10/2012  4:48 PM    TAKE these medications         aspirin 81 MG tablet   Take 81 mg by  mouth daily.      B-12 SL   Place 1 tablet under the tongue daily.      budesonide-formoterol 160-4.5 MCG/ACT inhaler   Commonly known as: SYMBICORT   Inhale 2 puffs into the lungs 2 (two) times daily as needed. For wheezing      carvedilol 25 MG tablet   Commonly known as: COREG   Take 1 tablet (25 mg total) by mouth 2 (two) times daily.      cloNIDine 0.1 MG tablet   Commonly known as: CATAPRES   Take 3 tablets (0.3 mg total) by mouth 2 (two) times daily.      cyclobenzaprine 10 MG tablet   Commonly known as: FLEXERIL   Take 10 mg by mouth every 8 (eight) hours as needed. For muscle spasms      diazepam 5 MG tablet   Commonly known as: VALIUM   Take 1-2 tablets (5-10 mg total) by mouth every 6 (six) hours as needed.      diclofenac sodium 1 % Gel   Commonly known as: VOLTAREN   Apply 4 g topically 4 (four) times daily. To knee for pain      doxepin 10 MG capsule   Commonly known as: SINEQUAN   Take 30 mg by mouth at bedtime.      fluocinonide ointment 0.05 %   Commonly known as: LIDEX   USE EVERY OTHER DAY ON SCALP, NOT ON FACE      gabapentin 300 MG capsule   Commonly known as: NEURONTIN   Take 300 mg by mouth every 8 (eight) hours.  HYDROcodone-acetaminophen 5-325 MG per tablet   Commonly known as: NORCO/VICODIN   Take 1-2 tablets by mouth every 6 (six) hours as needed. For pain      hydrocortisone 2.5 % cream   APPLY TO AFFECTED AREA ONE TIME PER DAY AROUND MOUTH FOR SCALING AND DRYNESS      levothyroxine 150 MCG tablet   Commonly known as: SYNTHROID, LEVOTHROID   Take 75-150 mcg by mouth daily. Takes 1/2 tablet on Tuesday and Thursday      losartan 100 MG tablet   Commonly known as: COZAAR   Take 100 mg by mouth daily.      multivitamin with minerals Tabs   Take 1 tablet by mouth daily.      oxyCODONE-acetaminophen 5-325 MG per tablet   Commonly known as: PERCOCET/ROXICET   Take 1 tablet by mouth every 4 (four) hours as needed.      ranitidine  150 MG tablet   Commonly known as: ZANTAC   Take 150 mg by mouth 2 (two) times daily.      spironolactone 50 MG tablet   Commonly known as: ALDACTONE   Take 1 tablet (50 mg total) by mouth daily.      Vitamin D3 1000 UNITS Caps   Take 1,000 Units by mouth daily.      vitamin E 400 UNIT capsule   Take 400 Units by mouth daily.           Follow-up Information    Follow up with Sahalie Beth A, MD. Call in 1 week. (Ask for Wells Guiles)    Contact information:   [address] [place] [address] [phone]          Signed: Charlie Pitter 03/10/2012, 4:48 PM

## 2012-03-10 NOTE — Clinical Social Work Note (Signed)
Clinical Social Work  Pt will Agricultural [facility] to [facility] on 10/26, as medications are unable to be ordered this late in the date. CSW will continue to follow to facilitate discharge to SNF in the AM.   Darden Dates, MSW, [place]

## 2012-03-11 DIAGNOSIS — K5900 Constipation, unspecified: Secondary | ICD-10-CM | POA: Diagnosis not present

## 2012-03-11 DIAGNOSIS — IMO0002 Reserved for concepts with insufficient information to code with codable children: Secondary | ICD-10-CM | POA: Diagnosis not present

## 2012-03-11 DIAGNOSIS — Z5189 Encounter for other specified aftercare: Secondary | ICD-10-CM | POA: Diagnosis not present

## 2012-03-11 DIAGNOSIS — M48061 Spinal stenosis, lumbar region without neurogenic claudication: Secondary | ICD-10-CM | POA: Diagnosis not present

## 2012-03-11 DIAGNOSIS — Z4889 Encounter for other specified surgical aftercare: Secondary | ICD-10-CM | POA: Diagnosis not present

## 2012-03-11 DIAGNOSIS — J95821 Acute postprocedural respiratory failure: Secondary | ICD-10-CM | POA: Diagnosis not present

## 2012-03-11 DIAGNOSIS — N189 Chronic kidney disease, unspecified: Secondary | ICD-10-CM | POA: Diagnosis not present

## 2012-03-11 DIAGNOSIS — I10 Essential (primary) hypertension: Secondary | ICD-10-CM | POA: Diagnosis not present

## 2012-03-11 DIAGNOSIS — R262 Difficulty in walking, not elsewhere classified: Secondary | ICD-10-CM | POA: Diagnosis not present

## 2012-03-11 DIAGNOSIS — E039 Hypothyroidism, unspecified: Secondary | ICD-10-CM | POA: Diagnosis not present

## 2012-03-11 DIAGNOSIS — M6281 Muscle weakness (generalized): Secondary | ICD-10-CM | POA: Diagnosis not present

## 2012-03-11 DIAGNOSIS — M4800 Spinal stenosis, site unspecified: Secondary | ICD-10-CM | POA: Diagnosis not present

## 2012-03-11 DIAGNOSIS — M5126 Other intervertebral disc displacement, lumbar region: Secondary | ICD-10-CM | POA: Diagnosis not present

## 2012-03-11 DIAGNOSIS — M549 Dorsalgia, unspecified: Secondary | ICD-10-CM | POA: Diagnosis not present

## 2012-03-11 DIAGNOSIS — I9589 Other hypotension: Secondary | ICD-10-CM | POA: Diagnosis not present

## 2012-03-11 NOTE — Clinical Social Work Note (Signed)
Clinical Social Work  Pt is ready for discharge to [facility]. Facility has received discharge summary and is ready to accept pt. Pt and family are agreeable to to discharge plan. [facility] will provide transportation. CSW is signing off as no further needs identified.   Darden Dates, MSW, [place]

## 2012-03-11 NOTE — Progress Notes (Signed)
Stable. Plan discharge today.

## 2012-03-11 NOTE — Discharge Summary (Signed)
Pt discharged to [facility] in stable condition . Report call to Nurse  Floreen Comber at receiving facility

## 2012-03-13 DIAGNOSIS — I10 Essential (primary) hypertension: Secondary | ICD-10-CM | POA: Diagnosis not present

## 2012-03-13 DIAGNOSIS — K5900 Constipation, unspecified: Secondary | ICD-10-CM | POA: Diagnosis not present

## 2012-03-13 DIAGNOSIS — E039 Hypothyroidism, unspecified: Secondary | ICD-10-CM | POA: Diagnosis not present

## 2012-03-13 DIAGNOSIS — M5126 Other intervertebral disc displacement, lumbar region: Secondary | ICD-10-CM | POA: Diagnosis not present

## 2012-03-13 DIAGNOSIS — IMO0002 Reserved for concepts with insufficient information to code with codable children: Secondary | ICD-10-CM | POA: Diagnosis not present

## 2012-03-29 DIAGNOSIS — Z4789 Encounter for other orthopedic aftercare: Secondary | ICD-10-CM | POA: Diagnosis not present

## 2012-03-29 DIAGNOSIS — D869 Sarcoidosis, unspecified: Secondary | ICD-10-CM | POA: Diagnosis not present

## 2012-03-29 DIAGNOSIS — D649 Anemia, unspecified: Secondary | ICD-10-CM | POA: Diagnosis not present

## 2012-03-29 DIAGNOSIS — I129 Hypertensive chronic kidney disease with stage 1 through stage 4 chronic kidney disease, or unspecified chronic kidney disease: Secondary | ICD-10-CM | POA: Diagnosis not present

## 2012-03-29 DIAGNOSIS — N189 Chronic kidney disease, unspecified: Secondary | ICD-10-CM | POA: Diagnosis not present

## 2012-03-30 DIAGNOSIS — N189 Chronic kidney disease, unspecified: Secondary | ICD-10-CM | POA: Diagnosis not present

## 2012-03-30 DIAGNOSIS — I129 Hypertensive chronic kidney disease with stage 1 through stage 4 chronic kidney disease, or unspecified chronic kidney disease: Secondary | ICD-10-CM | POA: Diagnosis not present

## 2012-03-30 DIAGNOSIS — Z4789 Encounter for other orthopedic aftercare: Secondary | ICD-10-CM | POA: Diagnosis not present

## 2012-03-30 DIAGNOSIS — D869 Sarcoidosis, unspecified: Secondary | ICD-10-CM | POA: Diagnosis not present

## 2012-03-30 DIAGNOSIS — D649 Anemia, unspecified: Secondary | ICD-10-CM | POA: Diagnosis not present

## 2012-04-02 DIAGNOSIS — D649 Anemia, unspecified: Secondary | ICD-10-CM | POA: Diagnosis not present

## 2012-04-02 DIAGNOSIS — I129 Hypertensive chronic kidney disease with stage 1 through stage 4 chronic kidney disease, or unspecified chronic kidney disease: Secondary | ICD-10-CM | POA: Diagnosis not present

## 2012-04-02 DIAGNOSIS — Z4789 Encounter for other orthopedic aftercare: Secondary | ICD-10-CM | POA: Diagnosis not present

## 2012-04-02 DIAGNOSIS — D869 Sarcoidosis, unspecified: Secondary | ICD-10-CM | POA: Diagnosis not present

## 2012-04-02 DIAGNOSIS — N189 Chronic kidney disease, unspecified: Secondary | ICD-10-CM | POA: Diagnosis not present

## 2012-04-04 DIAGNOSIS — N189 Chronic kidney disease, unspecified: Secondary | ICD-10-CM | POA: Diagnosis not present

## 2012-04-04 DIAGNOSIS — I129 Hypertensive chronic kidney disease with stage 1 through stage 4 chronic kidney disease, or unspecified chronic kidney disease: Secondary | ICD-10-CM | POA: Diagnosis not present

## 2012-04-04 DIAGNOSIS — Z4789 Encounter for other orthopedic aftercare: Secondary | ICD-10-CM | POA: Diagnosis not present

## 2012-04-04 DIAGNOSIS — D869 Sarcoidosis, unspecified: Secondary | ICD-10-CM | POA: Diagnosis not present

## 2012-04-04 DIAGNOSIS — D649 Anemia, unspecified: Secondary | ICD-10-CM | POA: Diagnosis not present

## 2012-04-05 DIAGNOSIS — D869 Sarcoidosis, unspecified: Secondary | ICD-10-CM | POA: Diagnosis not present

## 2012-04-05 DIAGNOSIS — N189 Chronic kidney disease, unspecified: Secondary | ICD-10-CM | POA: Diagnosis not present

## 2012-04-05 DIAGNOSIS — I129 Hypertensive chronic kidney disease with stage 1 through stage 4 chronic kidney disease, or unspecified chronic kidney disease: Secondary | ICD-10-CM | POA: Diagnosis not present

## 2012-04-05 DIAGNOSIS — D649 Anemia, unspecified: Secondary | ICD-10-CM | POA: Diagnosis not present

## 2012-04-05 DIAGNOSIS — Z4789 Encounter for other orthopedic aftercare: Secondary | ICD-10-CM | POA: Diagnosis not present

## 2012-04-06 DIAGNOSIS — N189 Chronic kidney disease, unspecified: Secondary | ICD-10-CM | POA: Diagnosis not present

## 2012-04-06 DIAGNOSIS — D869 Sarcoidosis, unspecified: Secondary | ICD-10-CM | POA: Diagnosis not present

## 2012-04-06 DIAGNOSIS — Z4789 Encounter for other orthopedic aftercare: Secondary | ICD-10-CM | POA: Diagnosis not present

## 2012-04-06 DIAGNOSIS — I129 Hypertensive chronic kidney disease with stage 1 through stage 4 chronic kidney disease, or unspecified chronic kidney disease: Secondary | ICD-10-CM | POA: Diagnosis not present

## 2012-04-06 DIAGNOSIS — D649 Anemia, unspecified: Secondary | ICD-10-CM | POA: Diagnosis not present

## 2012-04-07 DIAGNOSIS — N189 Chronic kidney disease, unspecified: Secondary | ICD-10-CM | POA: Diagnosis not present

## 2012-04-07 DIAGNOSIS — Z4789 Encounter for other orthopedic aftercare: Secondary | ICD-10-CM | POA: Diagnosis not present

## 2012-04-07 DIAGNOSIS — D869 Sarcoidosis, unspecified: Secondary | ICD-10-CM | POA: Diagnosis not present

## 2012-04-07 DIAGNOSIS — I129 Hypertensive chronic kidney disease with stage 1 through stage 4 chronic kidney disease, or unspecified chronic kidney disease: Secondary | ICD-10-CM | POA: Diagnosis not present

## 2012-04-07 DIAGNOSIS — D649 Anemia, unspecified: Secondary | ICD-10-CM | POA: Diagnosis not present

## 2012-04-10 DIAGNOSIS — Z4789 Encounter for other orthopedic aftercare: Secondary | ICD-10-CM | POA: Diagnosis not present

## 2012-04-10 DIAGNOSIS — I129 Hypertensive chronic kidney disease with stage 1 through stage 4 chronic kidney disease, or unspecified chronic kidney disease: Secondary | ICD-10-CM | POA: Diagnosis not present

## 2012-04-10 DIAGNOSIS — D869 Sarcoidosis, unspecified: Secondary | ICD-10-CM | POA: Diagnosis not present

## 2012-04-10 DIAGNOSIS — D649 Anemia, unspecified: Secondary | ICD-10-CM | POA: Diagnosis not present

## 2012-04-10 DIAGNOSIS — N189 Chronic kidney disease, unspecified: Secondary | ICD-10-CM | POA: Diagnosis not present

## 2012-04-12 DIAGNOSIS — N189 Chronic kidney disease, unspecified: Secondary | ICD-10-CM | POA: Diagnosis not present

## 2012-04-12 DIAGNOSIS — D649 Anemia, unspecified: Secondary | ICD-10-CM | POA: Diagnosis not present

## 2012-04-12 DIAGNOSIS — Z4789 Encounter for other orthopedic aftercare: Secondary | ICD-10-CM | POA: Diagnosis not present

## 2012-04-12 DIAGNOSIS — D869 Sarcoidosis, unspecified: Secondary | ICD-10-CM | POA: Diagnosis not present

## 2012-04-12 DIAGNOSIS — I129 Hypertensive chronic kidney disease with stage 1 through stage 4 chronic kidney disease, or unspecified chronic kidney disease: Secondary | ICD-10-CM | POA: Diagnosis not present

## 2012-04-14 ENCOUNTER — Other Ambulatory Visit: Payer: Self-pay | Admitting: Internal Medicine

## 2012-04-14 DIAGNOSIS — Z4789 Encounter for other orthopedic aftercare: Secondary | ICD-10-CM | POA: Diagnosis not present

## 2012-04-14 DIAGNOSIS — I129 Hypertensive chronic kidney disease with stage 1 through stage 4 chronic kidney disease, or unspecified chronic kidney disease: Secondary | ICD-10-CM | POA: Diagnosis not present

## 2012-04-14 DIAGNOSIS — D869 Sarcoidosis, unspecified: Secondary | ICD-10-CM | POA: Diagnosis not present

## 2012-04-14 DIAGNOSIS — D649 Anemia, unspecified: Secondary | ICD-10-CM | POA: Diagnosis not present

## 2012-04-14 DIAGNOSIS — N189 Chronic kidney disease, unspecified: Secondary | ICD-10-CM | POA: Diagnosis not present

## 2012-04-14 NOTE — Telephone Encounter (Signed)
OK X 30 days but additional refills require  TSH ( this was due  In 8/13) & office visit to update medical history.  Please  schedule  Labs :  TSH .Code: [id number].

## 2012-04-14 NOTE — Telephone Encounter (Signed)
Last OV 12-16-11

## 2012-04-14 NOTE — Telephone Encounter (Signed)
Called pt on home# [id number] to call office

## 2012-04-14 NOTE — Telephone Encounter (Signed)
Pt called back coming in 12.9.13 at 2pm

## 2012-04-17 DIAGNOSIS — I129 Hypertensive chronic kidney disease with stage 1 through stage 4 chronic kidney disease, or unspecified chronic kidney disease: Secondary | ICD-10-CM | POA: Diagnosis not present

## 2012-04-17 DIAGNOSIS — Z4789 Encounter for other orthopedic aftercare: Secondary | ICD-10-CM | POA: Diagnosis not present

## 2012-04-17 DIAGNOSIS — D649 Anemia, unspecified: Secondary | ICD-10-CM | POA: Diagnosis not present

## 2012-04-17 DIAGNOSIS — D869 Sarcoidosis, unspecified: Secondary | ICD-10-CM | POA: Diagnosis not present

## 2012-04-17 DIAGNOSIS — N189 Chronic kidney disease, unspecified: Secondary | ICD-10-CM | POA: Diagnosis not present

## 2012-04-19 DIAGNOSIS — I129 Hypertensive chronic kidney disease with stage 1 through stage 4 chronic kidney disease, or unspecified chronic kidney disease: Secondary | ICD-10-CM | POA: Diagnosis not present

## 2012-04-19 DIAGNOSIS — D649 Anemia, unspecified: Secondary | ICD-10-CM | POA: Diagnosis not present

## 2012-04-19 DIAGNOSIS — Z4789 Encounter for other orthopedic aftercare: Secondary | ICD-10-CM | POA: Diagnosis not present

## 2012-04-19 DIAGNOSIS — N189 Chronic kidney disease, unspecified: Secondary | ICD-10-CM | POA: Diagnosis not present

## 2012-04-19 DIAGNOSIS — D869 Sarcoidosis, unspecified: Secondary | ICD-10-CM | POA: Diagnosis not present

## 2012-04-20 DIAGNOSIS — M48061 Spinal stenosis, lumbar region without neurogenic claudication: Secondary | ICD-10-CM | POA: Diagnosis not present

## 2012-04-21 DIAGNOSIS — I129 Hypertensive chronic kidney disease with stage 1 through stage 4 chronic kidney disease, or unspecified chronic kidney disease: Secondary | ICD-10-CM | POA: Diagnosis not present

## 2012-04-21 DIAGNOSIS — N189 Chronic kidney disease, unspecified: Secondary | ICD-10-CM | POA: Diagnosis not present

## 2012-04-21 DIAGNOSIS — D869 Sarcoidosis, unspecified: Secondary | ICD-10-CM | POA: Diagnosis not present

## 2012-04-21 DIAGNOSIS — D649 Anemia, unspecified: Secondary | ICD-10-CM | POA: Diagnosis not present

## 2012-04-21 DIAGNOSIS — Z4789 Encounter for other orthopedic aftercare: Secondary | ICD-10-CM | POA: Diagnosis not present

## 2012-04-24 ENCOUNTER — Ambulatory Visit (INDEPENDENT_AMBULATORY_CARE_PROVIDER_SITE_OTHER): Payer: Medicare Other | Admitting: Internal Medicine

## 2012-04-24 ENCOUNTER — Other Ambulatory Visit: Payer: Self-pay | Admitting: Internal Medicine

## 2012-04-24 ENCOUNTER — Encounter: Payer: Self-pay | Admitting: Internal Medicine

## 2012-04-24 VITALS — BP 140/92 | HR 97 | Wt 213.8 lb

## 2012-04-24 DIAGNOSIS — D649 Anemia, unspecified: Secondary | ICD-10-CM | POA: Diagnosis not present

## 2012-04-24 DIAGNOSIS — N189 Chronic kidney disease, unspecified: Secondary | ICD-10-CM | POA: Diagnosis not present

## 2012-04-24 DIAGNOSIS — E039 Hypothyroidism, unspecified: Secondary | ICD-10-CM | POA: Diagnosis not present

## 2012-04-24 DIAGNOSIS — I10 Essential (primary) hypertension: Secondary | ICD-10-CM | POA: Diagnosis not present

## 2012-04-24 DIAGNOSIS — M48062 Spinal stenosis, lumbar region with neurogenic claudication: Secondary | ICD-10-CM | POA: Diagnosis not present

## 2012-04-24 DIAGNOSIS — Z8639 Personal history of other endocrine, nutritional and metabolic disease: Secondary | ICD-10-CM

## 2012-04-24 MED ORDER — CARVEDILOL 25 MG PO TABS: 25.0000 mg | ORAL_TABLET | Freq: Two times a day (BID) | ORAL | Status: DC

## 2012-04-24 MED ORDER — GABAPENTIN 300 MG PO CAPS: ORAL_CAPSULE | ORAL | Status: DC

## 2012-04-24 MED ORDER — DIAZEPAM 5 MG PO TABS: ORAL_TABLET | ORAL | Status: DC

## 2012-04-24 MED ORDER — BUDESONIDE-FORMOTEROL FUMARATE 160-4.5 MCG/ACT IN AERO: 2.0000 | INHALATION_SPRAY | Freq: Two times a day (BID) | RESPIRATORY_TRACT | Status: DC | PRN

## 2012-04-24 MED ORDER — DIAZEPAM 5 MG PO TABS
5.0000 mg | ORAL_TABLET | Freq: Four times a day (QID) | ORAL | Status: DC | PRN
Start: 2012-04-24 — End: 2012-04-24

## 2012-04-24 MED ORDER — CLONIDINE HCL 0.1 MG PO TABS: 0.3000 mg | ORAL_TABLET | Freq: Two times a day (BID) | ORAL | Status: DC

## 2012-04-24 NOTE — Addendum Note (Signed)
Addended by: Logan Bores on: 04/24/2012 03:01 PM   Modules accepted: Orders

## 2012-04-24 NOTE — Assessment & Plan Note (Signed)
BMET ordered.  ?

## 2012-04-24 NOTE — Assessment & Plan Note (Signed)
BP goals reviewed

## 2012-04-24 NOTE — Patient Instructions (Addendum)
Please review the medication list in the After Visit Summary provided.Please write the name of the prescribing physician to the right of the medication and share this with all medical staff seen at each appointment. This will help provide continuity of care; help optimize therapeutic interventions;and help prevent drug:drug adverse reaction.  Miralax every 3 rd day as needed for constipation.  Blood Pressure Goal  Ideally is an AVERAGE < 135/85. This AVERAGE should be calculated from @ least 5-7 BP readings taken @ different times of day on different days of week. You should not respond to isolated BP readings , but rather the AVERAGE for that week  If you activate My Chart; the results can be released to you as soon as they populate from the lab. If you choose not to use this program; the labs have to be reviewed, copied & mailed   causing a delay in getting the results to you.

## 2012-04-24 NOTE — Addendum Note (Signed)
Addended byUnice Cobble F on: 04/24/2012 03:04 PM   Modules accepted: Orders

## 2012-04-24 NOTE — Progress Notes (Signed)
  Subjective:    Patient ID: Tina Molina, female    DOB: Apr 29, 1946, 66 y.o.   MRN: [id number]  HPI Complicated hospital course following a posterior lumbar fusion 02/29/12 was reviewed. Postoperatively she developed hypotension, acute renal insufficiency, and respiratory insufficiency necessitating ICU status  10/18-10/23. Following stabilization she was discharged to rehabilitation 10/26-11/11.  The last recorded renal function study revealed a creatinine 1.15 and GFR 57 110/21. Her hematocrit was 27.7 at that time. Glucoses ranged from 108-127.  At this time she states she is stable. Her blood pressure is somewhat labile. These range from 116/63-200/100    Review of Systems She denies chest pain, dyspnea, paroxysmal nocturnal dyspnea or claudication. She has had isolated palpitations 12/7. She does have some edema of the lower extremities.  She describes constipation since discharge. She denies melena or rectal bleeding     Objective:   Physical Exam Gen.:  well-nourished in appearance. Alert, appropriate and cooperative throughout exam.   Eyes: No corneal or conjunctival inflammation noted.No icterus Neck: No deformities, masses, or tenderness noted.  Thyroid post operatively. Lungs: Normal respiratory effort; chest expands symmetrically. Lungs are clear to auscultation without rales, wheezes, or increased work of breathing. Heart: Normal rate and rhythm. Normal S1 and S2. No gallop, click, or rub.S4 w/o  murmur. Abdomen: Bowel sounds active. Wearing soft brace Musculoskeletal/extremities:  No clubbing, cyanosis,  or deformity noted. Tone & strength  normal.Joints normal. Nail health  Good. Edema 1/2 + @ankles  Vascular: Carotid, radial artery, dorsalis pedis and  posterior tibial pulses are full and equal. No bruits present. Neurologic: Alert and oriented x3.(Note: Suanne Marker , sister in law, provides excellent history of dates)  Skin: Intact without suspicious lesions or  rashes. Lymph: No cervical, axillary lymphadenopathy present. Psych: Mood and affect are normal. Normally interactive                                                                                         Assessment & Plan:

## 2012-04-24 NOTE — Assessment & Plan Note (Signed)
Anemia status will be evaluated with CBC and differential, iron panel and also B12 level. By history she's had B12 deficiency in the past

## 2012-04-25 DIAGNOSIS — D869 Sarcoidosis, unspecified: Secondary | ICD-10-CM | POA: Diagnosis not present

## 2012-04-25 DIAGNOSIS — I129 Hypertensive chronic kidney disease with stage 1 through stage 4 chronic kidney disease, or unspecified chronic kidney disease: Secondary | ICD-10-CM | POA: Diagnosis not present

## 2012-04-25 DIAGNOSIS — D649 Anemia, unspecified: Secondary | ICD-10-CM | POA: Diagnosis not present

## 2012-04-25 DIAGNOSIS — Z4789 Encounter for other orthopedic aftercare: Secondary | ICD-10-CM | POA: Diagnosis not present

## 2012-04-25 DIAGNOSIS — N189 Chronic kidney disease, unspecified: Secondary | ICD-10-CM | POA: Diagnosis not present

## 2012-04-25 LAB — CBC WITH DIFFERENTIAL/PLATELET
Basophils Absolute: 0 10*3/uL (ref 0.0–0.1)
Basophils Relative: 0.4 % (ref 0.0–3.0)
Eosinophils Absolute: 0.2 10*3/uL (ref 0.0–0.7)
Eosinophils Relative: 5 % (ref 0.0–5.0)
HCT: 31 % — ABNORMAL LOW (ref 36.0–46.0)
Hemoglobin: 10.2 g/dL — ABNORMAL LOW (ref 12.0–15.0)
Lymphocytes Relative: 42.2 % (ref 12.0–46.0)
Lymphs Abs: 1.8 10*3/uL (ref 0.7–4.0)
MCHC: 33 g/dL (ref 30.0–36.0)
MCV: 97.5 fl (ref 78.0–100.0)
Monocytes Absolute: 0.4 10*3/uL (ref 0.1–1.0)
Monocytes Relative: 9.5 % (ref 3.0–12.0)
Neutro Abs: 1.8 10*3/uL (ref 1.4–7.7)
Neutrophils Relative %: 42.9 % — ABNORMAL LOW (ref 43.0–77.0)
Platelets: 282 10*3/uL (ref 150.0–400.0)
RBC: 3.18 Mil/uL — ABNORMAL LOW (ref 3.87–5.11)
RDW: 15.5 % — ABNORMAL HIGH (ref 11.5–14.6)
WBC: 4.3 10*3/uL — ABNORMAL LOW (ref 4.5–10.5)

## 2012-04-25 LAB — TSH: TSH: 0.8 u[IU]/mL (ref 0.35–5.50)

## 2012-04-25 LAB — IBC PANEL
Iron: 69 ug/dL (ref 42–145)
Saturation Ratios: 22.6 % (ref 20.0–50.0)
Transferrin: 218.2 mg/dL (ref 212.0–360.0)

## 2012-04-25 LAB — BASIC METABOLIC PANEL
BUN: 13 mg/dL (ref 6–23)
CO2: 26 mEq/L (ref 19–32)
Calcium: 9.1 mg/dL (ref 8.4–10.5)
Chloride: 105 mEq/L (ref 96–112)
Creatinine, Ser: 1.2 mg/dL (ref 0.4–1.2)
GFR: 58.36 mL/min — ABNORMAL LOW (ref >60.00)
Glucose, Bld: 113 mg/dL — ABNORMAL HIGH (ref 70–99)
Potassium: 4.2 mEq/L (ref 3.5–5.1)
Sodium: 138 mEq/L (ref 135–145)

## 2012-04-25 LAB — VITAMIN B12: Vitamin B-12: 835 pg/mL (ref 211–911)

## 2012-04-26 DIAGNOSIS — Z4789 Encounter for other orthopedic aftercare: Secondary | ICD-10-CM | POA: Diagnosis not present

## 2012-04-26 DIAGNOSIS — N189 Chronic kidney disease, unspecified: Secondary | ICD-10-CM | POA: Diagnosis not present

## 2012-04-26 DIAGNOSIS — I129 Hypertensive chronic kidney disease with stage 1 through stage 4 chronic kidney disease, or unspecified chronic kidney disease: Secondary | ICD-10-CM | POA: Diagnosis not present

## 2012-04-26 DIAGNOSIS — D649 Anemia, unspecified: Secondary | ICD-10-CM | POA: Diagnosis not present

## 2012-04-26 DIAGNOSIS — D869 Sarcoidosis, unspecified: Secondary | ICD-10-CM | POA: Diagnosis not present

## 2012-04-27 DIAGNOSIS — Z79899 Other long term (current) drug therapy: Secondary | ICD-10-CM | POA: Diagnosis not present

## 2012-04-28 ENCOUNTER — Telehealth: Payer: Self-pay | Admitting: *Deleted

## 2012-04-28 NOTE — Telephone Encounter (Signed)
Pt called stating that her computer is down and would like to get her lab result..Discuss with patient, verbalized understanding.  Entered by Hendricks Limes, MD at 04/25/2012 1:17 PM Anemia is improving; B12 and iron studies are normal. Monitor the CBC and differential in 3 months. (Code 281.9).The fasting glucose is minimally elevated. An A1c , the Diabetes screening test will be added.  BUN, creatinine, and GFR all assess kidney function. To protect the kidneys it is important to control your blood pressure and sugar. You should also stay well hydrated. Drink to thirst, up to 32 ounces of fluids a day.Share results with all MDs seen.  Thank you for using My Chart; it has served [place] well. [employer]

## 2012-05-02 DIAGNOSIS — D649 Anemia, unspecified: Secondary | ICD-10-CM | POA: Diagnosis not present

## 2012-05-02 DIAGNOSIS — Z4789 Encounter for other orthopedic aftercare: Secondary | ICD-10-CM | POA: Diagnosis not present

## 2012-05-02 DIAGNOSIS — D869 Sarcoidosis, unspecified: Secondary | ICD-10-CM | POA: Diagnosis not present

## 2012-05-02 DIAGNOSIS — I129 Hypertensive chronic kidney disease with stage 1 through stage 4 chronic kidney disease, or unspecified chronic kidney disease: Secondary | ICD-10-CM | POA: Diagnosis not present

## 2012-05-02 DIAGNOSIS — N189 Chronic kidney disease, unspecified: Secondary | ICD-10-CM | POA: Diagnosis not present

## 2012-05-04 DIAGNOSIS — I129 Hypertensive chronic kidney disease with stage 1 through stage 4 chronic kidney disease, or unspecified chronic kidney disease: Secondary | ICD-10-CM | POA: Diagnosis not present

## 2012-05-04 DIAGNOSIS — D649 Anemia, unspecified: Secondary | ICD-10-CM | POA: Diagnosis not present

## 2012-05-04 DIAGNOSIS — Z4789 Encounter for other orthopedic aftercare: Secondary | ICD-10-CM | POA: Diagnosis not present

## 2012-05-04 DIAGNOSIS — D869 Sarcoidosis, unspecified: Secondary | ICD-10-CM | POA: Diagnosis not present

## 2012-05-04 DIAGNOSIS — N189 Chronic kidney disease, unspecified: Secondary | ICD-10-CM | POA: Diagnosis not present

## 2012-05-07 DIAGNOSIS — I129 Hypertensive chronic kidney disease with stage 1 through stage 4 chronic kidney disease, or unspecified chronic kidney disease: Secondary | ICD-10-CM | POA: Diagnosis not present

## 2012-05-07 DIAGNOSIS — D649 Anemia, unspecified: Secondary | ICD-10-CM | POA: Diagnosis not present

## 2012-05-07 DIAGNOSIS — N189 Chronic kidney disease, unspecified: Secondary | ICD-10-CM | POA: Diagnosis not present

## 2012-05-07 DIAGNOSIS — D869 Sarcoidosis, unspecified: Secondary | ICD-10-CM | POA: Diagnosis not present

## 2012-05-07 DIAGNOSIS — Z4789 Encounter for other orthopedic aftercare: Secondary | ICD-10-CM | POA: Diagnosis not present

## 2012-05-12 DIAGNOSIS — N189 Chronic kidney disease, unspecified: Secondary | ICD-10-CM | POA: Diagnosis not present

## 2012-05-12 DIAGNOSIS — D869 Sarcoidosis, unspecified: Secondary | ICD-10-CM | POA: Diagnosis not present

## 2012-05-12 DIAGNOSIS — D649 Anemia, unspecified: Secondary | ICD-10-CM | POA: Diagnosis not present

## 2012-05-12 DIAGNOSIS — Z4789 Encounter for other orthopedic aftercare: Secondary | ICD-10-CM | POA: Diagnosis not present

## 2012-05-12 DIAGNOSIS — I129 Hypertensive chronic kidney disease with stage 1 through stage 4 chronic kidney disease, or unspecified chronic kidney disease: Secondary | ICD-10-CM | POA: Diagnosis not present

## 2012-05-16 ENCOUNTER — Telehealth: Payer: Self-pay | Admitting: Hematology & Oncology

## 2012-05-16 NOTE — Telephone Encounter (Signed)
Patient called and resh 02/04/12 apt for 06/08/12

## 2012-05-19 ENCOUNTER — Other Ambulatory Visit: Payer: Self-pay | Admitting: Internal Medicine

## 2012-06-01 DIAGNOSIS — M48061 Spinal stenosis, lumbar region without neurogenic claudication: Secondary | ICD-10-CM | POA: Diagnosis not present

## 2012-06-08 ENCOUNTER — Other Ambulatory Visit (HOSPITAL_BASED_OUTPATIENT_CLINIC_OR_DEPARTMENT_OTHER): Payer: Medicare Other | Admitting: Lab

## 2012-06-08 ENCOUNTER — Ambulatory Visit (HOSPITAL_BASED_OUTPATIENT_CLINIC_OR_DEPARTMENT_OTHER): Payer: Medicare Other | Admitting: Medical

## 2012-06-08 VITALS — BP 138/62 | HR 99 | Temp 98.4°F | Resp 18 | Ht 64.0 in | Wt 209.0 lb

## 2012-06-08 DIAGNOSIS — D631 Anemia in chronic kidney disease: Secondary | ICD-10-CM

## 2012-06-08 DIAGNOSIS — D72819 Decreased white blood cell count, unspecified: Secondary | ICD-10-CM

## 2012-06-08 DIAGNOSIS — D869 Sarcoidosis, unspecified: Secondary | ICD-10-CM

## 2012-06-08 DIAGNOSIS — D649 Anemia, unspecified: Secondary | ICD-10-CM

## 2012-06-08 DIAGNOSIS — N039 Chronic nephritic syndrome with unspecified morphologic changes: Secondary | ICD-10-CM | POA: Diagnosis not present

## 2012-06-08 LAB — CBC WITH DIFFERENTIAL (CANCER CENTER ONLY)
BASO#: 0 10*3/uL (ref 0.0–0.2)
BASO%: 0.5 % (ref 0.0–2.0)
EOS%: 2.9 % (ref 0.0–7.0)
Eosinophils Absolute: 0.2 10*3/uL (ref 0.0–0.5)
HCT: 30.5 % — ABNORMAL LOW (ref 34.8–46.6)
HGB: 10.1 g/dL — ABNORMAL LOW (ref 11.6–15.9)
LYMPH#: 2.5 10*3/uL (ref 0.9–3.3)
LYMPH%: 38.5 % (ref 14.0–48.0)
MCH: 31.8 pg (ref 26.0–34.0)
MCHC: 33.1 g/dL (ref 32.0–36.0)
MCV: 96 fL (ref 81–101)
MONO#: 1 10*3/uL — ABNORMAL HIGH (ref 0.1–0.9)
MONO%: 15.6 % — ABNORMAL HIGH (ref 0.0–13.0)
NEUT#: 2.8 10*3/uL (ref 1.5–6.5)
NEUT%: 42.5 % (ref 39.6–80.0)
Platelets: 257 10*3/uL (ref 145–400)
RBC: 3.18 10*6/uL — ABNORMAL LOW (ref 3.70–5.32)
RDW: 12.7 % (ref 11.1–15.7)
WBC: 6.5 10*3/uL (ref 3.9–10.0)

## 2012-06-08 LAB — CHCC SATELLITE - SMEAR

## 2012-06-08 NOTE — Progress Notes (Signed)
Diagnoses: #1   normochromic, normocytic anemia. #2.  Sarcoidosis. #3.  Transient leukopenia. #4.  Lymphocytosis, chronic.  Current therapy: Observation.  Interim history: [name] presents today for an office followup visit.  Her family member accompanies her.  The last, time, we saw her was back in June.  Since then, she had a three-level lumbar decompression and fusion.  Unfortunately, there were complications and she had significant hypovolemic shock.  She apparently ended up in the ICU with kidney, and respiratory failure.  When she was discharged.  She did have to go to a skilled nursing facility for about 2 months.  She is now back home with her sister-in-law.  Her blood pressure is significantly better controlled than when we saw her last.  She has not been bothered by her sarcoid.  There has been no cough, or shortness of breath.  Surprisingly, her blood looks good.  She still remains anemic at 10.1.  We are getting erythropoietin level on her today.  I did explain the possibility of her having to receive Aranesp.  She reports, that she did have to receive Aranesp quite some time ago, but can't remember the specifics.  She has had a elevated creatinine in the past.  A month ago her creatinine was actually normal at 1.2.  Her GFR was 58.36.  She does have problems with constipation.  She still remains on a slew of medications.  She reports, that her appetite is decent.  She denies any nausea, vomiting.  She denies any cough, chest pain, or shortness of breath.  She denies any fevers, chills, or night sweats.  She denies any headaches, visual changes, or rashes.  She denies any obvious, or abnormal bleeding.  She denies any lower leg swelling.  She denies any abdominal pain.  She denies any back or flank pain.  She is no problems with bowel or bladder incontinence.  Review of Systems: Constitutional:Negative for malaise/fatigue, fever, chills, weight loss, diaphoresis, activity change, appetite  change, and unexpected weight change.  HEENT: Negative for double vision, blurred vision, visual loss, ear pain, tinnitus, congestion, rhinorrhea, epistaxis sore throat or sinus disease, oral pain/lesion, tongue soreness Respiratory: Negative for cough, chest tightness, shortness of breath, wheezing and stridor.  Cardiovascular: Negative for chest pain, palpitations, leg swelling, orthopnea, PND, DOE or claudication Gastrointestinal: Negative for nausea, vomiting, abdominal pain, diarrhea, constipation, blood in stool, melena, hematochezia, abdominal distention, anal bleeding, rectal pain, anorexia and hematemesis.  Genitourinary: Negative for dysuria, frequency, hematuria,  Musculoskeletal: Negative for myalgias, back pain, joint swelling, arthralgias and gait problem.  Skin: Negative for rash, color change, pallor and wound.  Neurological:. Negative for dizziness/light-headedness, tremors, seizures, syncope, facial asymmetry, speech difficulty, weakness, numbness, headaches and paresthesias.  Hematological: Negative for adenopathy. Does not bruise/bleed easily.  Psychiatric/Behavioral:  Negative for depression, no loss of interest in normal activity or change in sleep pattern.   Physical Exam: This is a slightly cushingoid-appearing 67 year old, Afro-American female, in no obvious distress Vitals: Temperature 98.4 degrees, pulse 99, respirations 18, blood pressure 130/62.  Weight 209 pounds HEENT reveals a normocephalic, atraumatic skull, no scleral icterus, no oral lesions  Neck is supple without any cervical or supraclavicular adenopathy.  Lungs are clear to auscultation bilaterally. There are no wheezes, rales or rhonci Cardiac is regular rate and rhythm with a normal S1 and S2. There are no murmurs, rubs, or bruits.  Abdomen is soft with good bowel sounds, there is no palpable mass. There is no palpable hepatosplenomegaly. There is  no palpable fluid wave.  Musculoskeletal no tenderness of  the spine, ribs, or hips.  Extremities there are no clubbing, cyanosis, or edema.  Skin no petechia, purpura or ecchymosis Neurologic is nonfocal.  Laboratory Data: 6.5, hemoglobin 10.1, hematocrit 30.5.  Platelets 257,000  Current Outpatient Prescriptions on File Prior to Visit  Medication Sig Dispense Refill  . aspirin 81 MG tablet Take 81 mg by mouth daily.       . budesonide-formoterol (SYMBICORT) 160-4.5 MCG/ACT inhaler Inhale 2 puffs into the lungs 2 (two) times daily as needed. For wheezing  1 Inhaler  5  . carvedilol (COREG) 25 MG tablet Take 1 tablet (25 mg total) by mouth 2 (two) times daily.  60 tablet  5  . Cholecalciferol (VITAMIN D3) 1000 UNITS CAPS Take 1,000 Units by mouth daily.       . cloNIDine (CATAPRES) 0.1 MG tablet Take 3 tablets (0.3 mg total) by mouth 2 (two) times daily.  180 tablet  5  . Cyanocobalamin (B-12 SL) Place 1 tablet under the tongue daily.       . cyclobenzaprine (FLEXERIL) 10 MG tablet Take 10 mg by mouth every 8 (eight) hours as needed. For muscle spasms      . diazepam (VALIUM) 5 MG tablet 1/2-1 every 12 hrs as needed only  60 tablet  2  . doxepin (SINEQUAN) 10 MG capsule Take 10 mg by mouth at bedtime. 3 BY MOUTH AT BEDTIME      . fluocinonide ointment (LIDEX) 0.05 % USE EVERY OTHER DAY ON SCALP, NOT ON FACE  60 g  2  . gabapentin (NEURONTIN) 300 MG capsule take 1 capsule by mouth every 8 hours if needed  90 capsule  5  . HYDROcodone-acetaminophen (NORCO) 5-325 MG per tablet Take 1-2 tablets by mouth every 6 (six) hours as needed. For pain      . hydrocortisone 2.5 % cream APPLY TO AFFECTED AREA ONE TIME PER DAY AROUND MOUTH FOR SCALING AND DRYNESS  28 g  1  . levothyroxine (SYNTHROID, LEVOTHROID) 150 MCG tablet Take 150 mcg by mouth. 1 by mouth daily EXCEPT  1/2 tablet on Tuesday and Thursday      . levothyroxine (SYNTHROID, LEVOTHROID) 150 MCG tablet take 1 tablet by mouth once daily EXCEPT 1 AND 1/2 TABLET ON WEDNESDAY  108 tablet  2  . losartan  (COZAAR) 100 MG tablet Take 100 mg by mouth daily.      . Multiple Vitamin (MULTIVITAMIN WITH MINERALS) TABS Take 1 tablet by mouth daily.      Marland Kitchen oxyCODONE-acetaminophen (PERCOCET/ROXICET) 5-325 MG per tablet Take 1 tablet by mouth every 4 (four) hours as needed.  90 tablet  0  . ranitidine (ZANTAC) 150 MG tablet Take 150 mg by mouth 2 (two) times daily.      Marland Kitchen spironolactone (ALDACTONE) 50 MG tablet Take 1 tablet (50 mg total) by mouth daily.  30 tablet  11  . vitamin E 400 UNIT capsule Take 400 Units by mouth daily.       . [DISCONTINUED] modafinil (PROVIGIL) 100 MG tablet Take 100 mg by mouth daily.      . [DISCONTINUED] potassium chloride (MICRO-K) 10 MEQ CR capsule Take 1 capsule (10 mEq total) by mouth 2 (two) times daily.  180 capsule  1    Assessment/Plan: This is a 67 year old [place] American female, with the following issues:  #1.  Anemia.  This could be secondary to renal insufficiency.  We are checking a referral to  week, and level on her.  I did discuss utilizing an Aranesp, which she's had in the past.  #2.  Sarcoidosis.  This seems to be stable.  She is not symptomatic.  #3.  Transient leukopenia.  She is asymptomatic.  She is on his multiple medications for multiple medical conditions, which could possibly cause this.  #4.  Followup.  We will follow back up with Tina Molina in 2 months, but before then should there be questions or concerns.

## 2012-06-09 LAB — ERYTHROPOIETIN: Erythropoietin: 21.6 m[IU]/mL — ABNORMAL HIGH (ref 2.6–18.5)

## 2012-06-09 LAB — IRON AND TIBC
%SAT: 20 % (ref 20–55)
Iron: 51 ug/dL (ref 42–145)
TIBC: 260 ug/dL (ref 250–470)
UIBC: 209 ug/dL (ref 125–400)

## 2012-06-09 LAB — RETICULOCYTES (CHCC)
ABS Retic: 48.6 10*3/uL (ref 19.0–186.0)
RBC.: 3.24 MIL/uL — ABNORMAL LOW (ref 3.87–5.11)
Retic Ct Pct: 1.5 % (ref 0.4–2.3)

## 2012-06-09 LAB — FERRITIN: Ferritin: 128 ng/mL (ref 10–291)

## 2012-06-19 ENCOUNTER — Telehealth: Payer: Self-pay | Admitting: *Deleted

## 2012-06-19 NOTE — Telephone Encounter (Signed)
If no infection @ op site ,fever or rash

## 2012-06-19 NOTE — Telephone Encounter (Signed)
Pt left VM that she just had surgery on her back and would like to know if she should get a flu shot. .Please advise

## 2012-06-19 NOTE — Telephone Encounter (Signed)
Left message on voicemail with Dr.Hopper's response. Patient to call if she has additional questions or concerns

## 2012-06-20 ENCOUNTER — Other Ambulatory Visit: Payer: Self-pay | Admitting: Internal Medicine

## 2012-06-20 DIAGNOSIS — H40019 Open angle with borderline findings, low risk, unspecified eye: Secondary | ICD-10-CM | POA: Diagnosis not present

## 2012-07-01 ENCOUNTER — Other Ambulatory Visit: Payer: Self-pay

## 2012-07-05 DIAGNOSIS — M48061 Spinal stenosis, lumbar region without neurogenic claudication: Secondary | ICD-10-CM | POA: Diagnosis not present

## 2012-07-10 ENCOUNTER — Ambulatory Visit (INDEPENDENT_AMBULATORY_CARE_PROVIDER_SITE_OTHER): Payer: Medicare Other | Admitting: Cardiology

## 2012-07-10 VITALS — BP 130/80 | HR 80 | Ht 64.0 in | Wt 211.0 lb

## 2012-07-10 DIAGNOSIS — I10 Essential (primary) hypertension: Secondary | ICD-10-CM | POA: Diagnosis not present

## 2012-07-10 NOTE — Progress Notes (Signed)
HPI The patient presents for followup of difficult to control hypertension. Since I last saw her she has had back surgery. I reviewed these records. She had respiratory failure with sedation issues. She had hypotension. She had acute renal insufficiency. However, she eventually recovered from this and went to rehabilitation. Since that time she has had no acute cardiovascular problems. She denies any ongoing chest pressure, neck or arm discomfort. She has had no palpitations, presyncope or syncope. She might get short of breath doing moderate activity such as housework but this has been chronic. She has some chronic mild lower extremity swelling.  Allergies  Allergen Reactions  . Penicillins     Intolerance or allergy not remembered; her sister stated penicillin caused "boils under the skin"  . Shellfish Allergy Rash  . Sulfonamide Derivatives     Arthralgias; her sister stated that this caused a rash.  . Codeine     Nausea only with liquid codeine    Current Outpatient Prescriptions  Medication Sig Dispense Refill  . aspirin 81 MG tablet Take 81 mg by mouth daily.       . budesonide-formoterol (SYMBICORT) 160-4.5 MCG/ACT inhaler Inhale 2 puffs into the lungs 2 (two) times daily as needed. For wheezing  1 Inhaler  5  . carvedilol (COREG) 25 MG tablet Take 1 tablet (25 mg total) by mouth 2 (two) times daily.  60 tablet  5  . Cholecalciferol (VITAMIN D3) 1000 UNITS CAPS Take 1,000 Units by mouth daily.       . cloNIDine (CATAPRES) 0.1 MG tablet Take 3 tablets (0.3 mg total) by mouth 2 (two) times daily.  180 tablet  5  . Cyanocobalamin (B-12 SL) Place 1 tablet under the tongue as needed.       . cyclobenzaprine (FLEXERIL) 10 MG tablet Take 10 mg by mouth every 8 (eight) hours as needed. For muscle spasms      . diazepam (VALIUM) 5 MG tablet 1/2-1 every 12 hrs as needed only  60 tablet  2  . doxepin (SINEQUAN) 10 MG capsule 3 BY MOUTH AT BEDTIME      . gabapentin (NEURONTIN) 300 MG capsule  take 1 capsule by mouth every 8 hours if needed  90 capsule  5  . hydrocortisone 2.5 % cream APPLY TO AFFECTED AREA ONE TIME PER DAY AROUND MOUTH FOR SCALING AND DRYNESS  28 g  1  . levothyroxine (SYNTHROID, LEVOTHROID) 150 MCG tablet Take 150 mcg by mouth. 1 tab daily except on Tuesday and Thursday 1 1/2 tabs.      Marland Kitchen losartan (COZAAR) 100 MG tablet take 1 tablet by mouth once daily IN PLACE OF DIOVAN  90 tablet  1  . Multiple Vitamin (MULTIVITAMIN WITH MINERALS) TABS Take 1 tablet by mouth daily.      Marland Kitchen oxyCODONE-acetaminophen (PERCOCET/ROXICET) 5-325 MG per tablet Take 1 tablet by mouth every 4 (four) hours as needed.  90 tablet  0  . ranitidine (ZANTAC) 150 MG tablet take 1 tablet by mouth twice a day  180 tablet  2  . spironolactone (ALDACTONE) 50 MG tablet Take 1 tablet (50 mg total) by mouth daily.  30 tablet  11  . vitamin E 400 UNIT capsule Take 400 Units by mouth daily.       . [DISCONTINUED] modafinil (PROVIGIL) 100 MG tablet Take 100 mg by mouth daily.      . [DISCONTINUED] potassium chloride (MICRO-K) 10 MEQ CR capsule Take 1 capsule (10 mEq total) by mouth 2 (  two) times daily.  180 capsule  1   No current facility-administered medications for this visit.    Past Medical History  Diagnosis Date  . Pneumonia 04/2010    OP  . Anemia     Associated with leukopenia and lymphocytosis. This is been evaluated by Dr. Marin Olp . Her platelet count has been normal  . Hypertension   . Thyroid disease   . Chronic back pain   . Sarcoidosis   . Lymphocytosis   . GERD (gastroesophageal reflux disease)   . Chronic kidney disease   . Renal insufficiency   . Arthritis     Past Surgical History  Procedure Laterality Date  . Cholecystectomy    . Carpal tunnel release      Bilateral   . Cervical discectomy  06/2010    Dr.Pool  . Thyroidectomy  2003    For goiter  . Colonoscopy  2010  . Posterior lumbar fusion  02/29/2012    Dr Alyson Locket op respiratory & renal compromise    ROS:   As stated in the HPI and negative for all other systems.  PHYSICAL EXAM BP 130/80  Pulse 80  Ht 5\' 4"  (1.626 m)  Wt 211 lb (95.709 kg)  BMI 36.2 kg/m2 GENERAL:  Well appearing HEENT:  Pupils equal round and reactive, fundi not visualized, oral mucosa unremarkable NECK:  No jugular venous distention, waveform within normal limits, carotid upstroke brisk and symmetric, no bruits, no thyromegaly LYMPHATICS:  No cervical, inguinal adenopathy LUNGS:  Clear to auscultation bilaterally BACK:  No CVA tenderness CHEST:  Unremarkable HEART:  PMI not displaced or sustained,S1 and S2 within normal limits, no S3, no S4, no clicks, no rubs, no murmurs ABD:  Flat, positive bowel sounds normal in frequency in pitch, no bruits, no rebound, no guarding, no midline pulsatile mass, no hepatomegaly, no splenomegaly EXT:  2 plus pulses throughout, no edema, no cyanosis no clubbing SKIN:  No rashes no nodules NEURO:  Cranial nerves II through XII grossly intact, motor grossly intact throughout PSYCH:  Cognitively intact, oriented to person place and time  EKG:  Sinus rhythm, rate 80, axis within normal limits, intervals within normal limits, no acute ST-T wave changes.  ASSESSMENT AND PLAN  HTN: I reviewed multiple recent blood pressure readings including today and her blood pressure is now well controlled. She tolerates the meds as listed. No change in therapy or further evaluation is indicated.  I think she can return to this clinic as needed.

## 2012-07-10 NOTE — Patient Instructions (Addendum)
The current medical regimen is effective;  continue present plan and medications.  Follow up as needed 

## 2012-07-11 DIAGNOSIS — M48061 Spinal stenosis, lumbar region without neurogenic claudication: Secondary | ICD-10-CM | POA: Diagnosis not present

## 2012-07-12 ENCOUNTER — Encounter: Payer: Self-pay | Admitting: Internal Medicine

## 2012-07-12 ENCOUNTER — Encounter: Payer: Self-pay | Admitting: Cardiology

## 2012-07-13 DIAGNOSIS — M48061 Spinal stenosis, lumbar region without neurogenic claudication: Secondary | ICD-10-CM | POA: Diagnosis not present

## 2012-07-20 DIAGNOSIS — M48061 Spinal stenosis, lumbar region without neurogenic claudication: Secondary | ICD-10-CM | POA: Diagnosis not present

## 2012-08-03 ENCOUNTER — Ambulatory Visit: Payer: Medicare Other | Admitting: Medical

## 2012-08-03 ENCOUNTER — Ambulatory Visit (HOSPITAL_BASED_OUTPATIENT_CLINIC_OR_DEPARTMENT_OTHER): Payer: Medicare Other | Admitting: Oncology

## 2012-08-03 ENCOUNTER — Other Ambulatory Visit (HOSPITAL_BASED_OUTPATIENT_CLINIC_OR_DEPARTMENT_OTHER): Payer: Medicare Other | Admitting: Lab

## 2012-08-03 ENCOUNTER — Encounter: Payer: Self-pay | Admitting: Oncology

## 2012-08-03 VITALS — BP 167/72 | HR 85 | Temp 98.7°F | Resp 16 | Ht 64.0 in | Wt 212.0 lb

## 2012-08-03 DIAGNOSIS — D869 Sarcoidosis, unspecified: Secondary | ICD-10-CM

## 2012-08-03 DIAGNOSIS — D472 Monoclonal gammopathy: Secondary | ICD-10-CM

## 2012-08-03 DIAGNOSIS — D649 Anemia, unspecified: Secondary | ICD-10-CM

## 2012-08-03 DIAGNOSIS — N189 Chronic kidney disease, unspecified: Secondary | ICD-10-CM

## 2012-08-03 HISTORY — DX: Monoclonal gammopathy: D47.2

## 2012-08-03 LAB — CBC WITH DIFFERENTIAL (CANCER CENTER ONLY)
BASO#: 0 10*3/uL (ref 0.0–0.2)
BASO%: 0.2 % (ref 0.0–2.0)
EOS%: 3.4 % (ref 0.0–7.0)
Eosinophils Absolute: 0.2 10*3/uL (ref 0.0–0.5)
HCT: 32.1 % — ABNORMAL LOW (ref 34.8–46.6)
HGB: 10.9 g/dL — ABNORMAL LOW (ref 11.6–15.9)
LYMPH#: 1.8 10*3/uL (ref 0.9–3.3)
LYMPH%: 32.4 % (ref 14.0–48.0)
MCH: 31.6 pg (ref 26.0–34.0)
MCHC: 34 g/dL (ref 32.0–36.0)
MCV: 93 fL (ref 81–101)
MONO#: 0.7 10*3/uL (ref 0.1–0.9)
MONO%: 11.9 % (ref 0.0–13.0)
NEUT#: 2.9 10*3/uL (ref 1.5–6.5)
NEUT%: 52.1 % (ref 39.6–80.0)
Platelets: 310 10*3/uL (ref 145–400)
RBC: 3.45 10*6/uL — ABNORMAL LOW (ref 3.70–5.32)
RDW: 12.3 % (ref 11.1–15.7)
WBC: 5.6 10*3/uL (ref 3.9–10.0)

## 2012-08-03 LAB — RETICULOCYTES (CHCC)
ABS Retic: 48.7 10*3/uL (ref 19.0–186.0)
RBC.: 3.48 MIL/uL — ABNORMAL LOW (ref 3.87–5.11)
Retic Ct Pct: 1.4 % (ref 0.4–2.3)

## 2012-08-03 LAB — IRON AND TIBC
%SAT: 28 % (ref 20–55)
Iron: 85 ug/dL (ref 42–145)
TIBC: 303 ug/dL (ref 250–470)
UIBC: 218 ug/dL (ref 125–400)

## 2012-08-03 LAB — FERRITIN: Ferritin: 68 ng/mL (ref 10–291)

## 2012-08-04 ENCOUNTER — Telehealth: Payer: Self-pay | Admitting: Internal Medicine

## 2012-08-04 ENCOUNTER — Other Ambulatory Visit: Payer: Self-pay | Admitting: Family Medicine

## 2012-08-04 DIAGNOSIS — E039 Hypothyroidism, unspecified: Secondary | ICD-10-CM | POA: Diagnosis not present

## 2012-08-04 DIAGNOSIS — N179 Acute kidney failure, unspecified: Secondary | ICD-10-CM | POA: Diagnosis not present

## 2012-08-04 DIAGNOSIS — R609 Edema, unspecified: Secondary | ICD-10-CM | POA: Diagnosis not present

## 2012-08-04 NOTE — Telephone Encounter (Signed)
Dr.Hopper was verbally informed patient to be seen on Monday

## 2012-08-04 NOTE — Telephone Encounter (Signed)
Patient Information:  Caller Name: [place]  Phone: [phone]  Patient: Tina Molina, Tina Molina  Gender: Female  DOB: [birth year]  Age: 67 Years  PCP: Unice Cobble  Office Follow Up:  Does the office need to follow up with this patient?: No  Instructions For The Office: N/A   Symptoms  Reason For Call & Symptoms: Pt has increased swelling over the past month in her hands/feet and legs. She is calling to see if there is a medication she can take. Pt saw Dr. Francetta Found yesterday (her hematologist ) but didn't mention the swelling.  Reviewed Health History In EMR: Yes  Reviewed Medications In EMR: Yes  Reviewed Allergies In EMR: Yes  Reviewed Surgeries / Procedures: Yes  Date of Onset of Symptoms: 07/07/2012  Guideline(s) Used:  No Protocol Available - Sick Adult  Disposition Per Guideline:   See Today or Tomorrow in Office  Reason For Disposition Reached:   Nursing judgment  Advice Given:  N/A  Patient Refused Recommendation:  Patient Will Follow Up With Office Later  Pt does not want to go to [facility] or [facility] tomorrrow. She is asking RN to schedule with Dr. [name] on Monday.

## 2012-08-04 NOTE — Progress Notes (Signed)
CC:   Darrick Penna. [name], MD,FACP,FCCP  Followup visit for this pleasant 67 year old woman with known sarcoidosis and chronic anemia.  She has some mild, chronic renal insufficiency.  Her sarcoid has involved her lungs but she has not been on any steroids in a number of years.  She has had uveitis.  I am sure that a component of her chronic normochromic anemia is also due to the sarcoid.  She had back surgery back in October and had complications detailed in the last progress note.  Hemoglobin fell down to 7 g.  She was transfused with she believes 3 units of blood.  Reviewing old records, it appears that she does have an IgA monoclonal gammopathy with elevated IgA of 792 mg percent recorded 11/12/2011.  Overall she feels well.  She is slowly recovering from the surgery and complications back in the fall.  She is still having some paresthesias down the anterior left thigh but ambulating without a cane or walker. She has had no interim infections.  PHYSICAL EXAMINATION:  VITAL SIGNS:  Blood pressure 167/72, pulse 85 regular, temp 98.7, respirations 16, weight 212 pounds, 5 feet 4 inches. HEENT:  Pharynx no erythema or exudate. LUNGS:  Clear to auscultation, resonant to percussion. CARDIAC:  Regular cardiac rhythm.  No murmur. LYMPHS:  No cervical, supraclavicular, axillary adenopathy. ABDOMEN:  Soft, obese, nontender.  No mass, no organomegaly. EXTREMITIES:  No edema, no calf tenderness. NEUROLOGIC:  With mental status intact, PERRLA, motor strength 5/5, reflexes absent symmetric at the knees, 1+ symmetric at the biceps. Sensation not tested.  LAB:  Hemoglobin has recovered to her previous baseline prior to surgery and is 10.9, hematocrit 32, MCV 93, white count 5600, 43 neutrophils, 42 lymphocytes, 12 monocytes and platelets 310,000.  Most recent iron studies done 06/08/2012, serum iron 51, TIBC 260, percent saturation 20, ferritin 128.  IMPRESSION: 1. Chronic normochromic  anemia likely secondary to underlying     sarcoidosis.  Contributions from mild chronic renal insufficiency     but recent creatinine down to 1.2 as of December of 2013.  No     indication to put her on an erythropoietin stimulating agent at     this time. 2. Sarcoidosis, currently not on any immunosuppressive therapy. 3. IgA monoclonal gammopathy, undetermined significance.  PLAN:  We will see her again in 6 months.  Repeat an IgA level and serum free light chains at that time.   ______________________________ Annia Belt, M.D., F.A.C.P. JMG/MEDQ  D:  08/03/2012  T:  08/04/2012  Job:  [id number]

## 2012-08-05 ENCOUNTER — Other Ambulatory Visit: Payer: Self-pay | Admitting: Family Medicine

## 2012-08-05 ENCOUNTER — Emergency Department (HOSPITAL_COMMUNITY)
Admission: EM | Admit: 2012-08-05 | Discharge: 2012-08-05 | Discharge status: Against Medical Advice | Payer: Medicare Other | Attending: Emergency Medicine | Admitting: Emergency Medicine

## 2012-08-05 ENCOUNTER — Ambulatory Visit (INDEPENDENT_AMBULATORY_CARE_PROVIDER_SITE_OTHER): Payer: Medicare Other | Admitting: Family Medicine

## 2012-08-05 ENCOUNTER — Encounter: Payer: Self-pay | Admitting: Family Medicine

## 2012-08-05 VITALS — BP 152/96 | HR 96 | Temp 98.7°F | Wt 212.2 lb

## 2012-08-05 DIAGNOSIS — R609 Edema, unspecified: Secondary | ICD-10-CM | POA: Diagnosis not present

## 2012-08-05 DIAGNOSIS — D869 Sarcoidosis, unspecified: Secondary | ICD-10-CM

## 2012-08-05 DIAGNOSIS — R319 Hematuria, unspecified: Secondary | ICD-10-CM | POA: Insufficient documentation

## 2012-08-05 LAB — POCT URINALYSIS DIPSTICK
Bilirubin, UA: NEGATIVE
Glucose, UA: NEGATIVE
Ketones, UA: NEGATIVE
Leukocytes, UA: NEGATIVE
Nitrite, UA: NEGATIVE
Protein, UA: NEGATIVE
Spec Grav, UA: 1.02
Urobilinogen, UA: 0.2
pH, UA: 6

## 2012-08-05 LAB — URINALYSIS, MICROSCOPIC ONLY
Bacteria, UA: NONE SEEN
Casts: NONE SEEN
Crystals: NONE SEEN

## 2012-08-05 LAB — SEDIMENTATION RATE: Sed Rate: 49 mm/hr — ABNORMAL HIGH (ref 0–22)

## 2012-08-05 LAB — BASIC METABOLIC PANEL
BUN: 17 mg/dL (ref 6–23)
CO2: 27 mEq/L (ref 19–32)
Calcium: 9 mg/dL (ref 8.4–10.5)
Chloride: 107 mEq/L (ref 96–112)
Creat: 1.28 mg/dL — ABNORMAL HIGH (ref 0.50–1.10)
Glucose, Bld: 97 mg/dL (ref 70–99)
Potassium: 4.2 mEq/L (ref 3.5–5.3)
Sodium: 142 mEq/L (ref 135–145)

## 2012-08-05 LAB — C-REACTIVE PROTEIN: CRP: 0.9 mg/dL — ABNORMAL HIGH (ref <0.60)

## 2012-08-05 LAB — TSH: TSH: 0.035 u[IU]/mL — ABNORMAL LOW (ref 0.350–4.500)

## 2012-08-05 NOTE — Assessment & Plan Note (Addendum)
Bilateral nonpitting upper and lower extremity edema noted which according to patient has progressively worsened since spine surgery 02/2012. Doubt cardiac related as latest echo WNL, no JVD, no pulm crackles. Check UA today to r/o proteinuria as well as TSH, Cr and ESR/CRP to eval for inflammatory arthritis Encouraged low salt diet, increased water, and elevation of legs. Consider compression stockings if not improved (?CVI). Discussed need to f/u with PCP.

## 2012-08-05 NOTE — Progress Notes (Signed)
  Subjective:    Patient ID: Tina Molina, female    DOB: May 11, 1946, 67 y.o.   MRN: [id number]  HPI CC: swelling  Pleasant 66yo with h/o sarcoidosis, anemia (followed by heme), HTN (on spironolactone, clonidine, carvedilol, losartan), hypothyroidism, CKD and MGUS presents with swelling in hands and legs and fatigue.  This has been going on since her lower back surgery in October.  Continues with physical therapy.  Swelling progressively getting worse.  Feels tight and some pain at finger joints.  Denies fevers/chills, chest pain/tightness, shortness of breath, facial swelling, orthopnea, cough, palpitations, new rash.  Echo 02/2012 - mildly increased LVH, vigorous EF 65-70%.  BP elevated today - in rush to get here did not take am meds.  Past Medical History  Diagnosis Date  . Pneumonia 04/2010    OP  . Anemia     Associated with leukopenia and lymphocytosis. This is been evaluated by Dr. Marin Olp . Her platelet count has been normal  . Hypertension   . Thyroid disease   . Chronic back pain   . Sarcoidosis   . Lymphocytosis   . GERD (gastroesophageal reflux disease)   . Chronic kidney disease   . Renal insufficiency   . Arthritis   . MGUS (monoclonal gammopathy of unknown significance) 08/03/2012    IgA 792 11/04/11    Past Surgical History  Procedure Laterality Date  . Cholecystectomy    . Carpal tunnel release      Bilateral   . Cervical discectomy  06/2010    Dr.Pool  . Thyroidectomy  2003    For goiter  . Colonoscopy  2010  . Posterior lumbar fusion  02/29/2012    Dr Alyson Locket op respiratory & renal compromise   Review of Systems Per HPI    Objective:   Physical Exam  Nursing note and vitals reviewed. Constitutional: She appears well-developed and well-nourished. No distress.  HENT:  Head: Normocephalic and atraumatic.  Mouth/Throat: Oropharynx is clear and moist. No oropharyngeal exudate.  Neck: No hepatojugular reflux and no JVD present. Carotid  bruit is not present. Thyromegaly present.  Cardiovascular: Normal rate, regular rhythm, normal heart sounds and intact distal pulses.   No murmur heard. Pulses:      Radial pulses are 2+ on the right side, and 2+ on the left side.       Dorsalis pedis pulses are 2+ on the right side, and 2+ on the left side.  Pulmonary/Chest: Effort normal and breath sounds normal. No respiratory distress. She has no wheezes. She has no rales.  No crackles  Abdominal: Soft. Bowel sounds are normal. She exhibits no mass. There is no rebound and no guarding.  Musculoskeletal: She exhibits edema (nonpitting).  Nonpitting edema present bilateral forearms and hands as well as bilateral legs, L mildly greater than R. Discomfort to palpation and some swelling noted on R 3rd and 4th MCP and PIP  Skin: Skin is warm and dry. No rash noted.       Assessment & Plan:

## 2012-08-05 NOTE — Addendum Note (Signed)
Addended by: Tammi Sou on: 08/05/2012 10:29 AM   Modules accepted: Orders

## 2012-08-05 NOTE — Patient Instructions (Addendum)
Urine checked today. I'd like to check blood work today - pass by [employer] [facility] ER for lab. In interim, watch sodium in diet, increase water, elevate legs. Follow up with Dr. [name] for labwork results.

## 2012-08-05 NOTE — Assessment & Plan Note (Signed)
No microscope here - will send urine for micro in lab.

## 2012-08-06 NOTE — Addendum Note (Signed)
Addended by: Ria Bush on: 08/06/2012 09:59 PM   Modules accepted: Level of Service

## 2012-08-07 ENCOUNTER — Ambulatory Visit: Payer: Self-pay | Admitting: Internal Medicine

## 2012-08-07 ENCOUNTER — Telehealth: Payer: Self-pay | Admitting: Internal Medicine

## 2012-08-07 NOTE — Telephone Encounter (Signed)
Patient was seen at Saturday clinic  [place] Triage Call Report Triage Record Num: [id number] Operator: Berniece Salines Patient Name: Tina Molina Call Date & Time: 08/04/2012 9:10:03PM Patient Phone: [phone] PCP: Unice Cobble Patient Gender: Female PCP Fax : [phone] Patient DOB: [birth year] Practice Name: [facility] Reason for Call: Caller: Berton Lan; PCP: Unice Cobble; CB#: [phone]; Call regarding Rescheduling appt. Pt was triaged today, 08/04/12 and received a disposition of "See Today or Tomorrow in Office". Per triage note, pt requested an appt for 08/07/12: "Pt does not want to go to [facility] or [facility] tomorrow. She is asking RN to schedule with Dr. [name] on Monday". Pt calling back to rescheduled for 08/05/12 stating that her niece knows how to get to the [place] office. Appt rescheduled via EPIC for 08/05/12 0900 at the [place] office. Protocol(s) Used: Office Note Recommended Outcome per Protocol: Information Noted and Sent to Office Reason for Outcome: Caller information to office Care Advice: ~

## 2012-08-07 NOTE — Telephone Encounter (Signed)
Noted, per MD protocol if patient seen or with pending appointment ok to close encounter

## 2012-08-09 ENCOUNTER — Other Ambulatory Visit: Payer: Self-pay | Admitting: Neurology

## 2012-08-15 ENCOUNTER — Encounter: Payer: Self-pay | Admitting: Lab

## 2012-08-16 ENCOUNTER — Encounter: Payer: Self-pay | Admitting: Internal Medicine

## 2012-08-16 ENCOUNTER — Ambulatory Visit (INDEPENDENT_AMBULATORY_CARE_PROVIDER_SITE_OTHER): Payer: Medicare Other | Admitting: Internal Medicine

## 2012-08-16 VITALS — BP 118/66 | HR 94 | Resp 13 | Wt 210.0 lb

## 2012-08-16 DIAGNOSIS — I10 Essential (primary) hypertension: Secondary | ICD-10-CM

## 2012-08-16 DIAGNOSIS — E039 Hypothyroidism, unspecified: Secondary | ICD-10-CM

## 2012-08-16 DIAGNOSIS — N179 Acute kidney failure, unspecified: Secondary | ICD-10-CM

## 2012-08-16 DIAGNOSIS — M48062 Spinal stenosis, lumbar region with neurogenic claudication: Secondary | ICD-10-CM

## 2012-08-16 MED ORDER — LEVOTHYROXINE SODIUM 150 MCG PO TABS: ORAL_TABLET | ORAL | Status: DC

## 2012-08-16 MED ORDER — SPIRONOLACTONE 50 MG PO TABS: ORAL_TABLET | ORAL | Status: DC

## 2012-08-16 NOTE — Assessment & Plan Note (Signed)
Positional T12- L1 radicular pain.Use of reaching devices discussed

## 2012-08-16 NOTE — Assessment & Plan Note (Signed)
DECREASE Spironolactone to 50 mg , 1/2 daily

## 2012-08-16 NOTE — Patient Instructions (Addendum)
Please  schedule fasting Labs in 10 weeks : BMET, TSH.PLEASE BRING THESE INSTRUCTIONS TO FOLLOW UP  LAB APPOINTMENT.This will guarantee correct labs are drawn, eliminating need for repeat blood sampling ( needle sticks ! ). Diagnoses /Codes: elevated creatinine, 244.9. Minimal Blood Pressure Goal= AVERAGE < 140/90;  Ideal is an AVERAGE < 135/85. This AVERAGE should be calculated from @ least 5-7 BP readings taken @ different times of day on different days of week. You should not respond to isolated BP readings , but rather the AVERAGE for that week .Please bring your  blood pressure cuff to office visits to verify that it is reliable.It  can also be checked against the blood pressure device at the pharmacy. Finger or wrist cuffs are not dependable; an arm cuff is.

## 2012-08-16 NOTE — Assessment & Plan Note (Signed)
TSH 0.035; thyroid DECREASED to 1 pill daily

## 2012-08-16 NOTE — Progress Notes (Signed)
  Subjective:    Patient ID: Tina Molina, female    DOB: [birth year], 67 y.o.   MRN: [id number]  HPI  She is on  Thyroid supplement at a dose of 150 mcg daily except for 1&1/2 pills on Tuesday and Thursday. Her present TSH is profoundly suppressed at 0.035. She is on thyroid supplement because of total thyroidectomy for a goiter.  Her kidney function has shown a mild decrease with a creatinine of 1.28. Significantly she is on spironolactone 50 mg daily. She also takes gabapentin 300 mg every 8 hours as needed.    Review of Systems She's had intermittent discomfort in the left lower corner/inguinal area with housework. This is in the context of neurosurgery for chronic lumbar radicular problems.     Objective:   Physical Exam Appears  well-nourished & in no acute distress  No carotid bruits are present.No neck pain distention present at 10 - 15 degrees. Thyroid absent Heart rhythm and rate are normal with no significant murmurs or gallops.  Chest is clear with no increased work of breathing  There is no evidence of aortic aneurysm or renal artery bruits  Abdomen soft with no organomegaly or masses. No HJR  No clubbing, cyanosis or edema present.  Pedal pulses are intact  But decreasaed  No ischemic skin changes are present .   Alert and oriented. Strength, tone, DTRs reflexes 0+ @ knees          Assessment & Plan:

## 2012-08-21 ENCOUNTER — Telehealth: Payer: Self-pay | Admitting: *Deleted

## 2012-08-21 DIAGNOSIS — Z79899 Other long term (current) drug therapy: Secondary | ICD-10-CM | POA: Diagnosis not present

## 2012-08-21 NOTE — Telephone Encounter (Signed)
Please see notes attached to most recent labs. There is no clinical evidence of need for additional pituitary tests. The pituitary has been suppressed because of excess thyroid supplementation. The goal is to get the TSH in the range of 1-3.

## 2012-08-21 NOTE — Telephone Encounter (Signed)
Pt would like to know if there is any addition test that can be done on pituitary gland to see why it is not functioning correctly. .Please advise

## 2012-08-22 NOTE — Telephone Encounter (Signed)
Discuss with patient  

## 2012-08-23 DIAGNOSIS — M545 Low back pain, unspecified: Secondary | ICD-10-CM | POA: Diagnosis not present

## 2012-08-29 DIAGNOSIS — M545 Low back pain, unspecified: Secondary | ICD-10-CM | POA: Diagnosis not present

## 2012-08-31 DIAGNOSIS — M545 Low back pain, unspecified: Secondary | ICD-10-CM | POA: Diagnosis not present

## 2012-09-05 DIAGNOSIS — M545 Low back pain, unspecified: Secondary | ICD-10-CM | POA: Diagnosis not present

## 2012-09-12 ENCOUNTER — Telehealth: Payer: Self-pay | Admitting: Internal Medicine

## 2012-09-12 NOTE — Telephone Encounter (Signed)
Voltaren GEL twice a day as necessary is okay if she has not had any reaction to this in the past. Aspercreme or Zostrix cream over-the-counter may possibly be cheaper.

## 2012-09-12 NOTE — Telephone Encounter (Signed)
Hopp please advise  

## 2012-09-12 NOTE — Telephone Encounter (Signed)
Patient Information:  Caller Name: Chinwe  Phone: [phone]  Patient: Mckinzey, Sturch  Gender: Female  DOB: 1945-07-28  Age: 67 Years  PCP: Unice Cobble  Office Follow Up:  Does the office need to follow up with this patient?: Yes  Instructions For The Office: Please follow up with patient regarding if its safe to use Voltaren again or if there is some other type of cream that can be used in its place for pain.  [facility] on [address] is drug store of choice.  RN Note:  Patient calls because she is having a flair up with her left knee that is arthritic. Has used in the past Voltaren 1% gel, but was told after back surgery not to use because it dropped her blood pressure to low.  Patient rates pain in left knee as a 5-6/10 pain scale- with new swelling.  Rt knee also throbs, but has a torn meniscus there, Since surgery Right thigh  pain present on and off.  Has been taking Percocet for the thigh pain and knee pain. No emergent symptoms assessed.  Triaged with care advice given-patient demonstrated her understanding.  Patient declines appointment at this time-is due to see her surgeon soon.  However she wants to know if now she can go back to using Voltaren or if there is something else she can use. Drug store of choice is [facility] on [address].  Symptoms  Reason For Call & Symptoms: Recent back surgery on 10/13- was told not to use Voltaren any more for painful joints.  Primary pain source is in knees and left thigh.  Reviewed Health History In EMR: Yes  Reviewed Medications In EMR: Yes  Reviewed Allergies In EMR: Yes  Reviewed Surgeries / Procedures: Yes  Date of Onset of Symptoms: 09/05/2012  Guideline(s) Used:  Knee Pain  Disposition Per Guideline:   See Within 2 Weeks in Office  Reason For Disposition Reached:   Knee pain is a chronic symptom (recurrent or ongoing AND lasting > 4 weeks)  Advice Given:  Knee Pain after Overuse:   Apply Heat to the Area: Beginning 48  hours after an injury, apply a warm washcloth or heating pad for 10 minutes three times a day to help increase blood flow and improve healing.  Rest Your Knee   for the next couple days. Avoid activities that worsen your pain. Reduce activities that put a lot of strain on the knee joint (e.g., deep knee bends, stair climbing, running).  Call Back If:  You become worse.  RN Overrode Recommendation:  Patient Requests Prescription  Requesting to use Voltaren if safe to do so now  or to see if something else can be called in for pain.

## 2012-09-12 NOTE — Telephone Encounter (Signed)
Patient aware of Dr.Hopper's response.

## 2012-09-14 ENCOUNTER — Other Ambulatory Visit: Payer: Self-pay | Admitting: Neurology

## 2012-09-15 DIAGNOSIS — M545 Low back pain, unspecified: Secondary | ICD-10-CM | POA: Diagnosis not present

## 2012-09-16 ENCOUNTER — Encounter: Payer: Self-pay | Admitting: Internal Medicine

## 2012-09-19 DIAGNOSIS — M545 Low back pain, unspecified: Secondary | ICD-10-CM | POA: Diagnosis not present

## 2012-09-20 DIAGNOSIS — M48062 Spinal stenosis, lumbar region with neurogenic claudication: Secondary | ICD-10-CM | POA: Diagnosis not present

## 2012-09-20 DIAGNOSIS — M545 Low back pain, unspecified: Secondary | ICD-10-CM | POA: Diagnosis not present

## 2012-09-21 DIAGNOSIS — IMO0002 Reserved for concepts with insufficient information to code with codable children: Secondary | ICD-10-CM | POA: Diagnosis not present

## 2012-09-21 DIAGNOSIS — M1710 Unilateral primary osteoarthritis, unspecified knee: Secondary | ICD-10-CM | POA: Diagnosis not present

## 2012-09-26 DIAGNOSIS — M545 Low back pain, unspecified: Secondary | ICD-10-CM | POA: Diagnosis not present

## 2012-10-15 ENCOUNTER — Other Ambulatory Visit: Payer: Self-pay | Admitting: Neurology

## 2012-10-25 ENCOUNTER — Other Ambulatory Visit: Payer: Self-pay | Admitting: Neurology

## 2012-11-03 ENCOUNTER — Other Ambulatory Visit (INDEPENDENT_AMBULATORY_CARE_PROVIDER_SITE_OTHER): Payer: Medicare Other

## 2012-11-03 ENCOUNTER — Ambulatory Visit (INDEPENDENT_AMBULATORY_CARE_PROVIDER_SITE_OTHER): Payer: Medicare Other | Admitting: Neurology

## 2012-11-03 ENCOUNTER — Encounter: Payer: Self-pay | Admitting: Neurology

## 2012-11-03 VITALS — BP 215/97 | HR 97 | Resp 16 | Ht 63.0 in | Wt 205.0 lb

## 2012-11-03 DIAGNOSIS — D869 Sarcoidosis, unspecified: Secondary | ICD-10-CM | POA: Diagnosis not present

## 2012-11-03 DIAGNOSIS — G4700 Insomnia, unspecified: Secondary | ICD-10-CM

## 2012-11-03 DIAGNOSIS — G4726 Circadian rhythm sleep disorder, shift work type: Secondary | ICD-10-CM | POA: Diagnosis not present

## 2012-11-03 DIAGNOSIS — I10 Essential (primary) hypertension: Secondary | ICD-10-CM | POA: Diagnosis not present

## 2012-11-03 LAB — BASIC METABOLIC PANEL
BUN: 20 mg/dL (ref 6–23)
CO2: 27 mEq/L (ref 19–32)
Calcium: 9.5 mg/dL (ref 8.4–10.5)
Chloride: 107 mEq/L (ref 96–112)
Creatinine, Ser: 1.1 mg/dL (ref 0.4–1.2)
GFR: 62.48 mL/min (ref >60.00)
Glucose, Bld: 142 mg/dL — ABNORMAL HIGH (ref 70–99)
Potassium: 4.2 mEq/L (ref 3.5–5.1)
Sodium: 140 mEq/L (ref 135–145)

## 2012-11-03 LAB — TSH: TSH: 0.02 u[IU]/mL — ABNORMAL LOW (ref 0.35–5.50)

## 2012-11-03 MED ORDER — DOXEPIN HCL 10 MG PO CAPS: 10.0000 mg | ORAL_CAPSULE | Freq: Every day | ORAL | Status: DC

## 2012-11-03 NOTE — Progress Notes (Signed)
[facility]  Provider:  Dr Linnaea Ahn Referring Provider: Hendricks Limes, MD Primary Care Physician:  Unice Cobble, MD  Chief Complaint  Patient presents with  . Sleep - RX- Refill    # 11 Revisit    HPI:  Tina Molina is a 67 y.o. female here for follow up on her sleep problems since 2012.Marland Kitchen   She has sarcoidosis, chronic lymphocytosis, normocytic anemia and transient leukopenia. Within the Epic system I found an entry of MGUS by Dr Marin Olp.  The patient has hypertension for many years but had a spell of malignant hypertension in 2013, is now on 5 different antihypertensives, creatinine was 1.3 at the time.  Her last visit in our office was on 08/05/2011 with C.Martin . HEENT problems of allergic rhinitis, runny nose,  Chronic  insomnia and she has been using Ambien 10 mg with good success.  She has rebound insomnia when not taking the medication she has also alternated with doxylamate , which is Unisom, and Doxepin .  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  She hasn't acquired circadian rhythm disorder and has also maintained an abnormal night and day rhythm,  since her sister who  Resides with her had been a night shift worker ,too -  And stays  awake at night as well, watching TV together. She has used doxepin sleep and was a that has also worked quite well for her,  she has used generic modafinil for the daytime(to stay awake) but had one episode of palpitation, it turned out this was on a day when she did not take modafinil.  Since March 2013 she has increasing arthritis pain she used NSAIDS for  pain control , until  blood pressure started to rise  again. She reports that a kidney function evaluation and Doppler of the renal artery did not give evidence of a renal origin off her hypertension. She is now  longer taking 3 blood pressure medications but 5.  We are  meeting today to address the chronic insomnia and medication risks in review of her underlying condition of pulmonary sarcoidosis, obesity and hypertension. He underwent a back surgery since her last visit with [place] on 02/15/2012 and went into acute renal failure and respiratory failure, and finally discharged from the hospital in November she had to go through a long rehabilitation process .    Review of Systems: Out of a complete 14 system review, the patient complains of only the following symptoms, and all other reviewed systems are negative.  insomnia , Back pain the patient is fatigued, still endorses daytime sleepiness today's Epworth score however is still finds her fatigue severity score is 43 points.    History   Social History  . Marital Status: Legally Separated    Spouse Name: N/A    Number of Children: 0  . Years of Education: 12   Occupational History  . Retired    Social History Main Topics  . Smoking status: Former Smoker    Types: Cigarettes    Quit date: 05/17/1997  . Smokeless tobacco: Never Used  . Alcohol Use: 0.6 oz/week    1 Glasses of wine per week     Comment: occasionally   . Drug Use: No  . Sexually Active: Not on file   Other Topics Concern  .  Not on file   Social History Narrative   Patient is divorced and lives with her sister. Patient is retired.   Caffeine- sometimes - one cup   Right handed.    Family History  Problem Relation Age of Onset  . Anemia Father   . Arthritis Father   . Heart failure Father 42    No CAD  . Diabetes Mother   . Arthritis Mother   . Glaucoma Mother   . Heart disease Mother 47    No CAD  . Hypertension Brother   . Anemia Brother   . Kidney disease Brother   . Hypertension Sister   . Kidney disease Sister   . Anemia Sister   . Diabetes Maternal Aunt   . Arthritis Paternal Grandmother     Past Medical History  Diagnosis Date  . Pneumonia  04/2010    OP  . Anemia     Associated with leukopenia and lymphocytosis. This is been evaluated by Dr. Marin Olp . Her platelet count has been normal  . Hypertension   . Thyroid disease   . Chronic back pain   . Sarcoidosis   . Lymphocytosis   . GERD (gastroesophageal reflux disease)   . Chronic kidney disease   . Renal insufficiency   . Arthritis   . MGUS (monoclonal gammopathy of unknown significance) 08/03/2012    IgA 792 11/04/11    Past Surgical History  Procedure Laterality Date  . Cholecystectomy    . Carpal tunnel release      Bilateral   . Cervical discectomy  06/2010    Dr.Pool  . Thyroidectomy  2003    For goiter  . Colonoscopy  2010  . Posterior lumbar fusion  02/29/2012    Dr Alyson Locket op respiratory & renal compromise    Current Outpatient Prescriptions  Medication Sig Dispense Refill  . aspirin 81 MG tablet Take 81 mg by mouth daily.       . budesonide-formoterol (SYMBICORT) 160-4.5 MCG/ACT inhaler Inhale 2 puffs into the lungs 2 (two) times daily as needed. For wheezing  1 Inhaler  5  . carvedilol (COREG) 25 MG tablet Take 1 tablet (25 mg total) by mouth 2 (two) times daily.  60 tablet  5  . Cholecalciferol (VITAMIN D3) 1000 UNITS CAPS Take 1,000 Units by mouth daily.       . cloNIDine (CATAPRES) 0.1 MG tablet Take 3 tablets (0.3 mg total) by mouth 2 (two) times daily.  180 tablet  5  . Cyanocobalamin (B-12 SL) Place 1 tablet under the tongue as needed.       . cyclobenzaprine (FLEXERIL) 10 MG tablet Take 10 mg by mouth every 8 (eight) hours as needed. For muscle spasms      . diazepam (VALIUM) 5 MG tablet 1/2-1 every 12 hrs as needed only  60 tablet  2  . doxepin (SINEQUAN) 10 MG capsule take 3 capsules by mouth at bedtime  30 capsule  0  . gabapentin (NEURONTIN) 300 MG capsule take 1 capsule by mouth every 8 hours if needed  90 capsule  5  . hydrocortisone 2.5 % cream APPLY TO AFFECTED AREA ONE TIME PER DAY AROUND MOUTH FOR SCALING AND DRYNESS  28 g  1  .  levothyroxine (SYNTHROID, LEVOTHROID) 150 MCG tablet 1 tab daily  90 tablet  1  . losartan (COZAAR) 100 MG tablet take 1 tablet by mouth once daily IN PLACE OF DIOVAN  90 tablet  1  . Multiple Vitamin (  MULTIVITAMIN WITH MINERALS) TABS Take 1 tablet by mouth daily.      Marland Kitchen oxyCODONE-acetaminophen (PERCOCET/ROXICET) 5-325 MG per tablet Take 1 tablet by mouth every 4 (four) hours as needed.  90 tablet  0  . ranitidine (ZANTAC) 150 MG tablet take 1 tablet by mouth twice a day  180 tablet  2  . spironolactone (ALDACTONE) 50 MG tablet 1/2 qd  30 tablet  2  . vitamin E 400 UNIT capsule Take 400 Units by mouth daily.       . [DISCONTINUED] modafinil (PROVIGIL) 100 MG tablet Take 100 mg by mouth daily.      . [DISCONTINUED] potassium chloride (MICRO-K) 10 MEQ CR capsule Take 1 capsule (10 mEq total) by mouth 2 (two) times daily.  180 capsule  1   No current facility-administered medications for this visit.    Allergies as of 11/03/2012 - Review Complete 11/03/2012  Allergen Reaction Noted  . Penicillins    . Shellfish allergy Rash 08/11/2011  . Sulfonamide derivatives    . Codeine  10/16/2009    Vitals: BP 215/97  Pulse 97  Resp 16  Ht 5\' 3"  (1.6 m)  Wt 205 lb (92.987 kg)  BMI 36.32 kg/m2 Last Weight:  Wt Readings from Last 1 Encounters:  11/03/12 205 lb (92.987 kg)   Last Height:   Ht Readings from Last 1 Encounters:  11/03/12 5\' 3"  (1.6 m)    Physical exam:  General: The patient is awake, alert and appears not in acute distress. The patient is well groomed. Head: Normocephalic, atraumatic. Neck is supple. Mallampati3 neck circumference: 14.5 inches , no nasal deviation. No retrognathia.  Cardiovascular:  Regular rate and rhythm, without  murmurs or carotid bruit, and without distended neck veins. Respiratory: Lungs are clear to auscultation. Skin:  Without evidence of edema, or rash Trunk: BMI is elevated and patient  has normal posture.  Neurologic exam : The patient is awake  and alert, oriented to place and time.  Memory subjective  described as intact. There is a normal attention span & concentration ability. Speech is fluent without  dysarthria, dysphonia or aphasia. Mood and affect are appropriate.  Cranial nerves: Pupils are equal and briskly reactive to light. Funduscopic exam without  evidence of pallor or edema. Extraocular movements  in vertical and horizontal planes intact and without nystagmus. Visual fields by finger perimetry are intact. Hearing to finger rub intact.  Facial sensation intact to fine touch. Facial motor strength is symmetric and tongue and uvula move midline.  Motor exam:   Normal tone and normal muscle bulk and symmetric normal strength in upper extremities. Torn meniscus made ROM limited, left knee with crepitation.   Sensory:  Fine touch, pinprick and vibration were tested in all extremities,  Numbness in both feet, and left L4-5 s1 dermatome.  Proprioception is tested in the upper extremities only. This was normal.  Coordination: Rapid alternating movements in the fingers/hands is tested and normal. Finger-to-nose maneuver tested and normal without evidence of ataxia, dysmetria or tremor.  Gait and station: Patient walks without assistive device. Strength within normal limits. Stance is stable and normal. Tandem gait is slowed, deferred- unfragmented turns . Romberg testing is normal.  And this patient has chronic insomnia related to her circadian rhythm disorder attributed to her shift work history of almost 3 decades. The hygiene behavior modifications have been discussed and partially implemented. The patient also is more related to lose weight but has been experiencing difficulties to exercise do to  her a knee injury and back pain.  The patient's that failed to mirtazapine and Elavil and had nightmares on these working well for her our surgery milligrams of a generic pills of doxepin. None The patient is sarcoidosis pulmonary with dry  eyes and CNS manifestations doxepin is worsening the dry eye component and dry mouth complement The patient is on multiple antihypertensives : fall risk is elevated at 7 point she is encouraged to do daily exercises with the pulmonary or cardial rehabilitation unit.  Today's blood pressure was extraordinarily high she had not taken her antihypertensives prior to the visit, than took the medication here- we will repeat her blood pressure in 30 minutes.

## 2012-11-03 NOTE — Addendum Note (Signed)
Addended by: Larey Seat on: 11/03/2012 12:19 PM   Modules accepted: Orders

## 2012-11-03 NOTE — Patient Instructions (Signed)
Insomnia Insomnia means you have trouble falling or staying asleep. It affects about one person in three at different times and is usually related to stress from work, school, or personal relations. Insomnia is also a sign of depression or anxiety. Other medical problems that cause insomnia include conditions that cause pain, night leg cramps, coughing, shortness of breath, urinary problems, and fevers. Sleep apnea is an abnormal breathing pattern at night that can cause insomnia and loud snoring. Certain medications and excess intake of caffeine drinks (coffee, tea, colas) can also interfere with normal sleep. Treatment for insomnia depends on the cause. Besides specific medical treatment, the following measures can help you relax and get better sleep. Get regular exercise every day, at least several hours before bed time. Try to get to bed at the same time every night. Take a hot bath before retiring to help you relax. Do not stay in bed if you are unable to sleep. During the daytime avoid staying in bed to watch television, eat, or read. Reduce unwanted noise and light in your room. Keep your room at a comfortable temperature. Avoid alcohol as it causes one to sleep less soundly, may cause you to awaken during the night, and can leave you feeling groggy the next day. Using a mild sedative prescribed or suggested by your caregiver may be needed, but the daily use of sleeping pills is not recommended. Anti-depressant medicines can improve sleep in people with depression. Please call your doctor for follow up care to better understand the cause and proper treatment of your insomnia. Document Released: 06/10/2004 Document Revised: 07/26/2011 Document Reviewed: 05/03/2005 [facility] Patient Information 2014 [employer].

## 2012-11-18 ENCOUNTER — Other Ambulatory Visit: Payer: Self-pay | Admitting: Internal Medicine

## 2012-11-22 DIAGNOSIS — M48062 Spinal stenosis, lumbar region with neurogenic claudication: Secondary | ICD-10-CM | POA: Diagnosis not present

## 2012-12-06 ENCOUNTER — Ambulatory Visit: Payer: Medicare Other | Admitting: Internal Medicine

## 2012-12-13 DIAGNOSIS — M1710 Unilateral primary osteoarthritis, unspecified knee: Secondary | ICD-10-CM | POA: Diagnosis not present

## 2012-12-13 DIAGNOSIS — IMO0002 Reserved for concepts with insufficient information to code with codable children: Secondary | ICD-10-CM | POA: Diagnosis not present

## 2012-12-15 ENCOUNTER — Encounter: Payer: Self-pay | Admitting: Internal Medicine

## 2012-12-15 ENCOUNTER — Ambulatory Visit (INDEPENDENT_AMBULATORY_CARE_PROVIDER_SITE_OTHER): Payer: Medicare Other | Admitting: Internal Medicine

## 2012-12-15 VITALS — BP 132/76 | HR 79 | Temp 97.8°F | Wt 208.0 lb

## 2012-12-15 DIAGNOSIS — D472 Monoclonal gammopathy: Secondary | ICD-10-CM | POA: Diagnosis not present

## 2012-12-15 DIAGNOSIS — R7309 Other abnormal glucose: Secondary | ICD-10-CM | POA: Diagnosis not present

## 2012-12-15 DIAGNOSIS — E039 Hypothyroidism, unspecified: Secondary | ICD-10-CM

## 2012-12-15 DIAGNOSIS — I10 Essential (primary) hypertension: Secondary | ICD-10-CM | POA: Diagnosis not present

## 2012-12-15 MED ORDER — LEVOTHYROXINE SODIUM 150 MCG PO TABS: ORAL_TABLET | ORAL | Status: DC

## 2012-12-15 MED ORDER — CARVEDILOL 25 MG PO TABS: ORAL_TABLET | ORAL | Status: DC

## 2012-12-15 MED ORDER — LOSARTAN POTASSIUM 100 MG PO TABS: ORAL_TABLET | ORAL | Status: DC

## 2012-12-15 MED ORDER — CLONIDINE HCL 0.1 MG PO TABS: 0.1000 mg | ORAL_TABLET | Freq: Two times a day (BID) | ORAL | Status: DC

## 2012-12-15 NOTE — Assessment & Plan Note (Signed)
A1c & urine microalbumin  

## 2012-12-15 NOTE — Progress Notes (Signed)
  Subjective:    Patient ID: Tina Molina, female    DOB: 30-Jul-1945, 67 y.o.   MRN: [id number]  HPI CHRONIC HYPERTENSION follow-up:  Home blood pressure range 120/65- 168/90, no average with wrist cuff Patient is compliant with medications No adverse effects noted from medication  No exercise program due to DJD of knee No specific dietary program but low salt diet. She eats out frequently         Review of Systems No chest pain, palpitations, dyspnea, claudication,edema or paroxysmal nocturnal dyspnea described No significant lightheadedness, headache, epistaxis, or syncope.  She was informed by the neurologist that she has monoclonal gammopathy which  Concerns her. Apparently this is been monitored by Dr. Marin Olp & has appt 8/20.  She has some burning under the left eye over today.. This has resolved. She denied associated ocular discharge, diplopia, blurred vision, loss of vision. She did feel as if her eyes were "scratchy".  She had blood work completed 11/03/12; glucose was 142 and TSH 0.02. Apparently these have not been addressed as your computer broke down & labs sent through My Chart     Objective:   Physical Exam Appears healthy and well-nourished & in no acute distress  Extraocular motion is intact; field of vision is normal. Vision is normal to confrontation. No evidence of conjunctivitis  Thyroid absent  No carotid bruits are present.No neck pain distention present at 10 - 15 degrees. Thyroid normal to palpation  Heart rhythm and rate are normal with no significant murmurs or gallops. S4  Chest is clear with no increased work of breathing  There is no evidence of aortic aneurysm or renal artery bruits  Abdomen soft with no organomegaly or masses. No HJR  No clubbing, cyanosis or edema present.  Pedal pulses are decreased   No ischemic skin changes are present . Nails  Healthy   Alert and oriented. Strength, tone, DTRs reflexes 0-1/2+  She has  no lymphadenopathy about the neck or axilla            Assessment & Plan:  See Current Assessment & Plan in Problem List under specific Diagnosis

## 2012-12-15 NOTE — Addendum Note (Signed)
Addended by: Modena Morrow D on: 12/15/2012 04:31 PM   Modules accepted: Orders

## 2012-12-15 NOTE — Patient Instructions (Addendum)
Minimal Blood Pressure Goal= AVERAGE < 140/90;  Ideal is an AVERAGE < 135/85. This AVERAGE should be calculated from @ least 5-7 BP readings taken @ different times of day on different days of week. You should not respond to isolated BP readings , but rather the AVERAGE for that week .Please bring your  blood pressure cuff to office visits to verify that it is reliable.It  can also be checked against the blood pressure device at the pharmacy. Finger or wrist cuffs are not dependable; an arm cuff is. Please  schedule TSH in 8-10 weeks after thyroid dose change . PLEASE BRING THESE INSTRUCTIONS TO FOLLOW UP  LAB APPOINTMENT.This will guarantee correct labs are drawn, eliminating need for repeat blood sampling ( needle sticks ! ). Diagnoses /Codes: [id number]

## 2012-12-15 NOTE — Assessment & Plan Note (Signed)
BP goals discussed ; BP controlled.D/C wrist cuff as readings unremarkable

## 2012-12-15 NOTE — Assessment & Plan Note (Signed)
1/2 pill on Weds ; TSH after 8-10 weeks

## 2012-12-15 NOTE — Addendum Note (Signed)
Addended by: Logan Bores on: 12/15/2012 04:24 PM   Modules accepted: Orders

## 2012-12-16 ENCOUNTER — Other Ambulatory Visit: Payer: Self-pay | Admitting: Internal Medicine

## 2012-12-16 LAB — MICROALBUMIN / CREATININE URINE RATIO
Creatinine, Urine: 76.2 mg/dL
Microalb Creat Ratio: 6.6 mg/g (ref 0.0–30.0)
Microalb, Ur: 0.5 mg/dL (ref 0.00–1.89)

## 2012-12-16 LAB — HEMOGLOBIN A1C
Hgb A1c MFr Bld: 6 % — ABNORMAL HIGH (ref <5.7)
Mean Plasma Glucose: 126 mg/dL — ABNORMAL HIGH (ref <117)

## 2012-12-20 ENCOUNTER — Other Ambulatory Visit: Payer: Self-pay

## 2013-01-03 ENCOUNTER — Ambulatory Visit (HOSPITAL_BASED_OUTPATIENT_CLINIC_OR_DEPARTMENT_OTHER): Payer: Medicare Other | Admitting: Hematology & Oncology

## 2013-01-03 ENCOUNTER — Other Ambulatory Visit (HOSPITAL_BASED_OUTPATIENT_CLINIC_OR_DEPARTMENT_OTHER): Payer: Medicare Other | Admitting: Lab

## 2013-01-03 VITALS — BP 155/80 | HR 93 | Temp 98.1°F | Resp 16 | Ht 62.0 in | Wt 208.0 lb

## 2013-01-03 DIAGNOSIS — D509 Iron deficiency anemia, unspecified: Secondary | ICD-10-CM | POA: Diagnosis not present

## 2013-01-03 DIAGNOSIS — D649 Anemia, unspecified: Secondary | ICD-10-CM | POA: Diagnosis not present

## 2013-01-03 DIAGNOSIS — D869 Sarcoidosis, unspecified: Secondary | ICD-10-CM

## 2013-01-03 DIAGNOSIS — D472 Monoclonal gammopathy: Secondary | ICD-10-CM

## 2013-01-03 LAB — CBC WITH DIFFERENTIAL (CANCER CENTER ONLY)
BASO#: 0 10*3/uL (ref 0.0–0.2)
BASO%: 0.2 % (ref 0.0–2.0)
EOS%: 3.2 % (ref 0.0–7.0)
Eosinophils Absolute: 0.2 10*3/uL (ref 0.0–0.5)
HCT: 31.7 % — ABNORMAL LOW (ref 34.8–46.6)
HGB: 10.8 g/dL — ABNORMAL LOW (ref 11.6–15.9)
LYMPH#: 2.7 10*3/uL (ref 0.9–3.3)
LYMPH%: 57.4 % — ABNORMAL HIGH (ref 14.0–48.0)
MCH: 32.5 pg (ref 26.0–34.0)
MCHC: 34.1 g/dL (ref 32.0–36.0)
MCV: 96 fL (ref 81–101)
MONO#: 0.6 10*3/uL (ref 0.1–0.9)
MONO%: 12 % (ref 0.0–13.0)
NEUT#: 1.3 10*3/uL — ABNORMAL LOW (ref 1.5–6.5)
NEUT%: 27.2 % — ABNORMAL LOW (ref 39.6–80.0)
Platelets: 244 10*3/uL (ref 145–400)
RBC: 3.32 10*6/uL — ABNORMAL LOW (ref 3.70–5.32)
RDW: 13.3 % (ref 11.1–15.7)
WBC: 4.7 10*3/uL (ref 3.9–10.0)

## 2013-01-03 LAB — CHCC SATELLITE - SMEAR

## 2013-01-03 NOTE — Progress Notes (Signed)
This office note has been dictated.

## 2013-01-04 LAB — IRON AND TIBC CHCC
%SAT: 21 % (ref 21–57)
Iron: 64 ug/dL (ref 41–142)
TIBC: 310 ug/dL (ref 236–444)
UIBC: 246 ug/dL (ref 120–384)

## 2013-01-04 LAB — FERRITIN CHCC: Ferritin: 49 ng/ml (ref 9–269)

## 2013-01-04 NOTE — Progress Notes (Signed)
DIAGNOSES: 1. Anemia of renal insufficiency. 2. Iron-deficiency anemia-intermittent. 3. IgA kappa monoclonal gammopathy of undetermined significance.  CURRENT THERAPY: 1. Aranesp 300 mcg subcu as needed for hemoglobin less than 10. 2. IV iron as indicated.  INTERIM HISTORY:  [name] comes in for followup.  We last saw her back in March.  Since then, she has been fairly busy.  She is still recovering from her back surgery that she had back in October.  This was a very extensive surgery.  She feels tired all the time.  She has had no obvious bleeding.  She sees Dr. Brett Fairy of neurology.  I think this is for her insomnia.  She does have sarcoidosis.  She is on quite a few medications.  When we last saw her, her iron studies showed a ferritin of 68 with iron saturation of 28%.  She has not had any obvious bleeding.  She has not noticed any problems with fevers, sweats, or chills.  Her weight is about the same.  She has had no flare-ups with her sarcoidosis.  PHYSICAL EXAMINATION:  General:  This is a fairly well-developed, well- nourished African American female in no obvious distress.  Vital signs: Temperature of 98.1, pulse 93, respiratory rate 16, blood pressure 155/80.  Weight is 208.  Head and neck:  Normocephalic, atraumatic skull.  There are no ocular or oral lesions.  There are no palpable cervical or supraclavicular lymph nodes.  Lungs:  Clear bilaterally. Cardiac:  Regular rate and rhythm with a normal S1, S2.  There are no murmurs, rubs or bruits.  Abdomen:  Soft.  She has good bowel sounds. There is no fluid wave.  There is no palpable hepatosplenomegaly.  Back: Shows a long laminectomy scar in the lumbosacral spine.  There is no tenderness over the spine, ribs, or hips.  Extremities:  Show some trace, nonpitting edema in her lower legs.  No venous cords are noted.  LABORATORY STUDIES:  White cell count is 4.7, hemoglobin 10.8, hematocrit 31.7, platelet count  244.  IMPRESSION:  [name] is a very nice 67 year old African female.  She has multifactorial anemia.  She has an IgA kappa monoclonal gammopathy of undetermined significance.  I really do not think the MGUS is a clinical issue at all. We will see what her iron studies show.  I suspect that she may need some IV iron.  I do not think we have ever given her Aranesp.  This certainly might be something to think about if we cannot get her feeling a little bit better.  I want her back in 2 months' time.  I want to see how she does if we have to give her iron.  If she does not respond to IV iron, if we give it to her, then we may have to consider Aranesp.  I do not see that she needs a bone marrow biopsy done.  Again, I believe this MGUS is reflective of her sarcoidosis.    ______________________________ Volanda Napoleon, M.D. PRE/MEDQ  D:  01/03/2013  T:  01/04/2013  Job:  [id number]

## 2013-01-06 LAB — KAPPA/LAMBDA LIGHT CHAINS
Kappa free light chain: 1.97 mg/dL — ABNORMAL HIGH (ref 0.33–1.94)
Kappa:Lambda Ratio: 0.35 (ref 0.26–1.65)
Lambda Free Lght Chn: 5.55 mg/dL — ABNORMAL HIGH (ref 0.57–2.63)

## 2013-01-06 LAB — IMMUNOFIXATION ELECTROPHORESIS
IgA: 661 mg/dL — ABNORMAL HIGH (ref 69–380)
IgG (Immunoglobin G), Serum: 1270 mg/dL (ref 690–1700)
IgM, Serum: 67 mg/dL (ref 52–322)
Total Protein, Serum Electrophoresis: 7.2 g/dL (ref 6.0–8.3)

## 2013-01-06 LAB — RETICULOCYTES (CHCC)
ABS Retic: 51.6 10*3/uL (ref 19.0–186.0)
RBC.: 3.44 MIL/uL — ABNORMAL LOW (ref 3.87–5.11)
Retic Ct Pct: 1.5 % (ref 0.4–2.3)

## 2013-01-06 LAB — COMPREHENSIVE METABOLIC PANEL
ALT: 19 U/L (ref 0–35)
AST: 22 U/L (ref 0–37)
Albumin: 4 g/dL (ref 3.5–5.2)
Alkaline Phosphatase: 95 U/L (ref 39–117)
BUN: 24 mg/dL — ABNORMAL HIGH (ref 6–23)
CO2: 26 mEq/L (ref 19–32)
Calcium: 9.2 mg/dL (ref 8.4–10.5)
Chloride: 106 mEq/L (ref 96–112)
Creatinine, Ser: 1.39 mg/dL — ABNORMAL HIGH (ref 0.50–1.10)
Glucose, Bld: 83 mg/dL (ref 70–99)
Potassium: 4.1 mEq/L (ref 3.5–5.3)
Sodium: 141 mEq/L (ref 135–145)
Total Bilirubin: 0.2 mg/dL — ABNORMAL LOW (ref 0.3–1.2)
Total Protein: 7.2 g/dL (ref 6.0–8.3)

## 2013-01-06 LAB — TRANSFERRIN RECEPTOR, SOLUABLE: Transferrin Receptor, Soluble: 1.17 mg/L (ref 0.76–1.76)

## 2013-01-09 ENCOUNTER — Other Ambulatory Visit: Payer: Self-pay | Admitting: Oncology

## 2013-01-09 ENCOUNTER — Telehealth: Payer: Self-pay | Admitting: *Deleted

## 2013-01-09 ENCOUNTER — Other Ambulatory Visit: Payer: Self-pay

## 2013-01-09 DIAGNOSIS — D509 Iron deficiency anemia, unspecified: Secondary | ICD-10-CM

## 2013-01-09 DIAGNOSIS — Z1231 Encounter for screening mammogram for malignant neoplasm of breast: Secondary | ICD-10-CM

## 2013-01-09 NOTE — Telephone Encounter (Addendum)
Message copied by Cottie Banda on Tue Jan 09, 2013  3:31 PM ------      Message from: Burney Gauze R      Created: Thu Jan 04, 2013  8:47 PM       Call - iron is dropping.  Need 1 dose of Feraheme 1020mg . Please set up.  Laurey Arrow ------Left message with patient that Liliane Channel will call her tomorrow. I put orders in.

## 2013-01-09 NOTE — Telephone Encounter (Signed)
Message copied by Jodelle Green on Tue Jan 09, 2013 11:42 AM ------      Message from: Burney Gauze R      Created: Thu Jan 04, 2013  8:47 PM       Call - iron is dropping.  Need 1 dose of Feraheme 1020mg . Please set up.  Pete ------

## 2013-01-10 ENCOUNTER — Telehealth: Payer: Self-pay | Admitting: Hematology & Oncology

## 2013-01-10 NOTE — Telephone Encounter (Signed)
Pt made 8-28 iron infusion appointment. Baxter Flattery aware

## 2013-01-11 ENCOUNTER — Ambulatory Visit (HOSPITAL_BASED_OUTPATIENT_CLINIC_OR_DEPARTMENT_OTHER): Payer: Medicare Other

## 2013-01-11 VITALS — BP 156/78 | HR 104 | Temp 98.5°F | Resp 20

## 2013-01-11 DIAGNOSIS — D509 Iron deficiency anemia, unspecified: Secondary | ICD-10-CM | POA: Diagnosis not present

## 2013-01-11 MED ORDER — SODIUM CHLORIDE 0.9 % IV SOLN
1020.0000 mg | Freq: Once | INTRAVENOUS | Status: AC
  Administered 2013-01-11: 1020 mg via INTRAVENOUS
  Filled 2013-01-11: qty 34

## 2013-01-11 MED ORDER — SODIUM CHLORIDE 0.9 % IV SOLN
Freq: Once | INTRAVENOUS | Status: AC
  Administered 2013-01-11: 13:00:00 via INTRAVENOUS

## 2013-01-11 NOTE — Patient Instructions (Addendum)
Ferumoxytol injection What is this medicine? FERUMOXYTOL is an iron complex. Iron is used to make healthy red blood cells, which carry oxygen and nutrients throughout the body. This medicine is used to treat iron deficiency anemia in people with chronic kidney disease. This medicine may be used for other purposes; ask your health care provider or pharmacist if you have questions. What should I tell my health care provider before I take this medicine? They need to know if you have any of these conditions: -anemia not caused by low iron levels -high levels of iron in the blood -magnetic resonance imaging (MRI) test scheduled -an unusual or allergic reaction to iron, other medicines, foods, dyes, or preservatives -pregnant or trying to get pregnant -breast-feeding How should I use this medicine? This medicine is for infusion into a vein. It is given by a health care professional in a hospital or clinic setting. Talk to your pediatrician regarding the use of this medicine in children. Special care may be needed. Overdosage: If you think you've taken too much of this medicine contact a poison control center or emergency room at once. Overdosage: If you think you have taken too much of this medicine contact a poison control center or emergency room at once. NOTE: This medicine is only for you. Do not share this medicine with others. What if I miss a dose? It is important not to miss your dose. Call your doctor or health care professional if you are unable to keep an appointment. What may interact with this medicine? This medicine may interact with the following medications: -other iron products This list may not describe all possible interactions. Give your health care provider a list of all the medicines, herbs, non-prescription drugs, or dietary supplements you use. Also tell them if you smoke, drink alcohol, or use illegal drugs. Some items may interact with your medicine. What should I watch  for while using this medicine? Visit your doctor or healthcare professional regularly. Tell your doctor or healthcare professional if your symptoms do not start to get better or if they get worse. You may need blood work done while you are taking this medicine. You may need to follow a special diet. Talk to your doctor. Foods that contain iron include: whole grains/cereals, dried fruits, beans, or peas, leafy green vegetables, and organ meats (liver, kidney). What side effects may I notice from receiving this medicine? Side effects that you should report to your doctor or health care professional as soon as possible: -allergic reactions like skin rash, itching or hives, swelling of the face, lips, or tongue -breathing problems -changes in blood pressure -feeling faint or lightheaded, falls -fever or chills -flushing, sweating, or hot feelings -swelling of the ankles or feet Side effects that usually do not require medical attention (Report these to your doctor or health care professional if they continue or are bothersome.): -diarrhea -headache -nausea, vomiting -stomach pain This list may not describe all possible side effects. Call your doctor for medical advice about side effects. You may report side effects to FDA at 1-800-FDA-1088. Where should I keep my medicine? This drug is given in a hospital or clinic and will not be stored at home. NOTE: This sheet is a summary. It may not cover all possible information. If you have questions about this medicine, talk to your doctor, pharmacist, or health care provider.  2013, [employer]vier/Gold Standard. (01/24/2008 9:48:25 PM)  

## 2013-01-31 ENCOUNTER — Ambulatory Visit
Admission: RE | Admit: 2013-01-31 | Discharge: 2013-01-31 | Disposition: A | Discharge status: Home / Self Care | Payer: Medicare Other | Source: Ambulatory Visit

## 2013-01-31 DIAGNOSIS — Z1231 Encounter for screening mammogram for malignant neoplasm of breast: Secondary | ICD-10-CM

## 2013-02-06 ENCOUNTER — Telehealth: Payer: Self-pay | Admitting: Internal Medicine

## 2013-02-06 NOTE — Telephone Encounter (Signed)
Refill: Spironolactone 50 mg tablet #30. Take 1/2 tablet every day. Qty 30. Last fill 12-16-12

## 2013-02-08 ENCOUNTER — Other Ambulatory Visit: Payer: Self-pay | Admitting: General Practice

## 2013-02-08 DIAGNOSIS — E039 Hypothyroidism, unspecified: Secondary | ICD-10-CM

## 2013-02-08 MED ORDER — LEVOTHYROXINE SODIUM 150 MCG PO TABS: ORAL_TABLET | ORAL | Status: DC

## 2013-02-08 MED ORDER — SPIRONOLACTONE 50 MG PO TABS: 25.0000 mg | ORAL_TABLET | Freq: Every day | ORAL | Status: DC

## 2013-02-08 NOTE — Telephone Encounter (Signed)
Rx was refill on 02-08-13.//AB/CMA

## 2013-02-14 DIAGNOSIS — M48062 Spinal stenosis, lumbar region with neurogenic claudication: Secondary | ICD-10-CM | POA: Diagnosis not present

## 2013-02-26 ENCOUNTER — Other Ambulatory Visit (HOSPITAL_BASED_OUTPATIENT_CLINIC_OR_DEPARTMENT_OTHER): Payer: Medicare Other | Admitting: Lab

## 2013-02-26 ENCOUNTER — Other Ambulatory Visit: Payer: Self-pay | Admitting: *Deleted

## 2013-02-26 ENCOUNTER — Ambulatory Visit (HOSPITAL_BASED_OUTPATIENT_CLINIC_OR_DEPARTMENT_OTHER): Payer: Medicare Other | Admitting: Hematology & Oncology

## 2013-02-26 ENCOUNTER — Ambulatory Visit: Payer: Medicare Other

## 2013-02-26 VITALS — BP 140/62 | HR 83 | Temp 98.1°F | Resp 16 | Ht 62.0 in | Wt 207.0 lb

## 2013-02-26 DIAGNOSIS — D649 Anemia, unspecified: Secondary | ICD-10-CM | POA: Diagnosis not present

## 2013-02-26 DIAGNOSIS — D509 Iron deficiency anemia, unspecified: Secondary | ICD-10-CM

## 2013-02-26 DIAGNOSIS — D472 Monoclonal gammopathy: Secondary | ICD-10-CM

## 2013-02-26 DIAGNOSIS — N289 Disorder of kidney and ureter, unspecified: Secondary | ICD-10-CM | POA: Diagnosis not present

## 2013-02-26 LAB — CBC WITH DIFFERENTIAL (CANCER CENTER ONLY)
BASO#: 0 10*3/uL (ref 0.0–0.2)
BASO%: 0.5 % (ref 0.0–2.0)
EOS%: 3.5 % (ref 0.0–7.0)
Eosinophils Absolute: 0.2 10*3/uL (ref 0.0–0.5)
HCT: 31.9 % — ABNORMAL LOW (ref 34.8–46.6)
HGB: 10.9 g/dL — ABNORMAL LOW (ref 11.6–15.9)
LYMPH#: 2.3 10*3/uL (ref 0.9–3.3)
LYMPH%: 53.4 % — ABNORMAL HIGH (ref 14.0–48.0)
MCH: 32.4 pg (ref 26.0–34.0)
MCHC: 34.2 g/dL (ref 32.0–36.0)
MCV: 95 fL (ref 81–101)
MONO#: 0.5 10*3/uL (ref 0.1–0.9)
MONO%: 11 % (ref 0.0–13.0)
NEUT#: 1.4 10*3/uL — ABNORMAL LOW (ref 1.5–6.5)
NEUT%: 31.6 % — ABNORMAL LOW (ref 39.6–80.0)
Platelets: 269 10*3/uL (ref 145–400)
RBC: 3.36 10*6/uL — ABNORMAL LOW (ref 3.70–5.32)
RDW: 12.8 % (ref 11.1–15.7)
WBC: 4.3 10*3/uL (ref 3.9–10.0)

## 2013-02-26 LAB — IRON AND TIBC CHCC
%SAT: 54 % (ref 21–57)
Iron: 143 ug/dL — ABNORMAL HIGH (ref 41–142)
TIBC: 263 ug/dL (ref 236–444)
UIBC: 120 ug/dL (ref 120–384)

## 2013-02-26 LAB — FERRITIN CHCC: Ferritin: 340 ng/ml — ABNORMAL HIGH (ref 9–269)

## 2013-02-26 NOTE — Progress Notes (Signed)
This office note has been dictated.

## 2013-02-27 NOTE — Progress Notes (Signed)
CC:   Darrick Penna. [name], MD,FACP,FCCP  DIAGNOSES: 1. Anemia of renal insufficiency. 2. Intermittent iron-deficiency anemia-patient last received iron in     August 2014. 3. IgA kappa monoclonal gammopathy of undetermined significance.  CURRENT THERAPY: 1. Aranesp 300 mcg subcu as needed for hemoglobin less than 10. 2. IV iron as indicated.  INTERIM HISTORY:  [name] comes in for followup.  She is doing fairly well.  We did go ahead and give her iron back in August.  At that point in time her ferritin was only 49 with iron saturation of 21%.  We are watching her monoclonal studies.  So far, they have not been much of a problem for [place].  Back in August, her IgA level was 661 mg/dL.  Her kappa light chain was 1.97 mg/dL.  She has had no fever.  There has been no cough.  There has been no change in bowel or bladder habits.  She has had some left knee problems. She says she needs to have surgery for this, but she wants to hold off as long as possible.  PHYSICAL EXAMINATION:  General:  This is a moderately obese African American female in no obvious distress.  Vital signs:  Temperature of 98.1, pulse 83, respiratory rate 16, blood pressure 140/62.  Weight is 207 pounds.  Head:  Shows no ocular or oral lesions.  There are no palpable cervical or supraclavicular lymph nodes.  She has no scleral icterus.  There is no adenopathy in the neck.  Lungs:  Clear bilaterally.  Cardiac:  Regular rate and rhythm with a normal S1 and S2. There are no murmurs, rubs or bruits.  Abdomen:  Soft.  She has good bowel sounds.  There is no fluid wave.  There is no palpable abdominal mass.  There is no palpable hepatosplenomegaly.  No tenderness over the spine, ribs, or hips.  Extremities:  No clubbing, cyanosis or edema.  LABORATORY STUDIES:  White cell count is 4.3, hemoglobin 10.9, hematocrit 31.9, platelet count 269.  MCV is 95.  IMPRESSION:  Tina Molina is a nice 67 year old African  American female.  She has multifactorial anemia.  She does have the IgA kappa MGUS.  For now, I do not think that she would be iron deficient.  I do not see that we need to give her any Aranesp.  She does have the mild renal insufficiency however, she is pretty much asymptomatic right now.  We will go ahead and plan to get her back to see [place] in another 2 months or so.    ______________________________ Volanda Napoleon, M.D. PRE/MEDQ  D:  02/26/2013  T:  02/27/2013  Job:  [id number]

## 2013-03-01 ENCOUNTER — Telehealth: Payer: Self-pay | Admitting: Oncology

## 2013-03-01 ENCOUNTER — Ambulatory Visit (INDEPENDENT_AMBULATORY_CARE_PROVIDER_SITE_OTHER): Payer: Medicare Other

## 2013-03-01 DIAGNOSIS — Z23 Encounter for immunization: Secondary | ICD-10-CM

## 2013-03-01 NOTE — Telephone Encounter (Addendum)
Message copied by Cottie Banda on Thu Mar 01, 2013  4:24 PM ------      Message from: Volanda Napoleon      Created: Mon Feb 26, 2013  6:48 PM       Please call and let her now that her iron levels are okay. Thanks. Pete ------Left message on machine.

## 2013-03-21 ENCOUNTER — Other Ambulatory Visit: Payer: Self-pay | Admitting: Internal Medicine

## 2013-03-21 DIAGNOSIS — K219 Gastro-esophageal reflux disease without esophagitis: Secondary | ICD-10-CM

## 2013-03-21 DIAGNOSIS — H40019 Open angle with borderline findings, low risk, unspecified eye: Secondary | ICD-10-CM | POA: Diagnosis not present

## 2013-03-21 NOTE — Telephone Encounter (Signed)
Refill for zantac sent to [facility]

## 2013-04-19 ENCOUNTER — Telehealth: Payer: Self-pay | Admitting: Hematology & Oncology

## 2013-04-19 NOTE — Telephone Encounter (Signed)
Pt cx 12-15 said would call back to reschedule

## 2013-04-30 ENCOUNTER — Ambulatory Visit: Payer: Medicare Other

## 2013-04-30 ENCOUNTER — Ambulatory Visit: Payer: Medicare Other | Admitting: Hematology & Oncology

## 2013-04-30 ENCOUNTER — Other Ambulatory Visit: Payer: Medicare Other | Admitting: Lab

## 2013-05-03 DIAGNOSIS — IMO0002 Reserved for concepts with insufficient information to code with codable children: Secondary | ICD-10-CM | POA: Diagnosis not present

## 2013-05-03 DIAGNOSIS — M1710 Unilateral primary osteoarthritis, unspecified knee: Secondary | ICD-10-CM | POA: Diagnosis not present

## 2013-05-07 ENCOUNTER — Other Ambulatory Visit: Payer: Self-pay | Admitting: Internal Medicine

## 2013-05-08 NOTE — Telephone Encounter (Signed)
Rx to read q 12 hrs prn only, do not take routinely

## 2013-05-08 NOTE — Telephone Encounter (Signed)
diazepam (VALIUM) 5 MG tablet Last refill: 8.14.14. Last OV: 8.1.14 Low risk, contract on file

## 2013-05-30 ENCOUNTER — Ambulatory Visit (INDEPENDENT_AMBULATORY_CARE_PROVIDER_SITE_OTHER): Payer: Medicare Other | Admitting: Internal Medicine

## 2013-05-30 ENCOUNTER — Encounter: Payer: Self-pay | Admitting: Internal Medicine

## 2013-05-30 VITALS — BP 191/104 | HR 96 | Temp 98.1°F | Wt 210.2 lb

## 2013-05-30 DIAGNOSIS — Z23 Encounter for immunization: Secondary | ICD-10-CM

## 2013-05-30 DIAGNOSIS — I10 Essential (primary) hypertension: Secondary | ICD-10-CM | POA: Diagnosis not present

## 2013-05-30 MED ORDER — CLONIDINE HCL 0.1 MG PO TABS: 0.1000 mg | ORAL_TABLET | Freq: Two times a day (BID) | ORAL | Status: DC

## 2013-05-30 MED ORDER — DIAZEPAM 5 MG PO TABS: ORAL_TABLET | ORAL | Status: DC

## 2013-05-30 MED ORDER — HYDRALAZINE HCL 25 MG PO TABS: 25.0000 mg | ORAL_TABLET | Freq: Three times a day (TID) | ORAL | Status: DC

## 2013-05-30 NOTE — Progress Notes (Signed)
Pre visit review using our clinic review tool, if applicable. No additional management support is needed unless otherwise documented below in the visit note. 

## 2013-05-30 NOTE — Progress Notes (Signed)
   Subjective:    Patient ID: Tina Molina, female    DOB: [birth year], 68 y.o.   MRN: [id number]  HPI Her blood pressure was found to be 182/102 and 190/112 on recheck with a large cuff @ the Orthopedist's office. He called requesting followup today  Her blood pressures at home averages 159/82. She is compliant with her blood pressure medications until she has no adverse effects with these.    Review of Systems  Significant headaches,visual changes, epistaxis, chest pain, palpitations, exertional dyspnea, claudication,or  paroxysmal nocturnal dyspnea noted. Some LLE edema .     Objective:   Physical Exam Appears  well-nourished & in no acute distress  No carotid bruits are present.No neck pain distention present at 10 - 15 degrees. Thyroid absent  Fundi: slight arteriolar narrowing  Heart rhythm and rate are normal with no significant murmurs or gallops. S4  Chest is clear with no increased work of breathing  There is no evidence of aortic aneurysm or renal artery bruits  Abdomen soft with no organomegaly or masses but dullness in UQ to percussion. No HJR  No clubbing or cyanosis . Trace edema present.  Pedal pulses are intact   No ischemic skin changes are present .   Alert and oriented. Strength, tone reflexes normal          Assessment & Plan:  See Current Assessment & Plan in Problem List under specific Diagnosis

## 2013-05-30 NOTE — Assessment & Plan Note (Signed)
BMET  Add Hydralazine

## 2013-05-30 NOTE — Addendum Note (Signed)
Addended by: Murtis Sink A on: 05/30/2013 04:42 PM   Modules accepted: Orders

## 2013-05-30 NOTE — Patient Instructions (Signed)
Your next office appointment will be determined based upon review of your pending labs &/ or x-rays. Those instructions will be transmitted to you through My Chart  or by mail if you're not using this system.   Minimal Blood Pressure Goal= AVERAGE < 150/90;  Ideal is an AVERAGE < 140/90. This AVERAGE should be calculated from @ least 5-7 BP readings taken @ different times of day on different days of week. You should not respond to isolated BP readings , but rather the AVERAGE for that week .Please bring your  blood pressure cuff to office visits to verify that it is reliable.It  can also be checked against the blood pressure device at the pharmacy. Finger or wrist cuffs are not dependable; an arm cuff is.

## 2013-05-31 ENCOUNTER — Telehealth: Payer: Self-pay | Admitting: Internal Medicine

## 2013-05-31 LAB — BASIC METABOLIC PANEL
BUN: 17 mg/dL (ref 6–23)
CO2: 26 mEq/L (ref 19–32)
Calcium: 9.4 mg/dL (ref 8.4–10.5)
Chloride: 108 mEq/L (ref 96–112)
Creatinine, Ser: 1.2 mg/dL (ref 0.4–1.2)
GFR: 57.6 mL/min — ABNORMAL LOW (ref >60.00)
Glucose, Bld: 87 mg/dL (ref 70–99)
Potassium: 3.9 mEq/L (ref 3.5–5.1)
Sodium: 141 mEq/L (ref 135–145)

## 2013-05-31 NOTE — Telephone Encounter (Signed)
Relevant patient education assigned to patient using Emmi. ° °

## 2013-06-06 DIAGNOSIS — IMO0002 Reserved for concepts with insufficient information to code with codable children: Secondary | ICD-10-CM | POA: Diagnosis not present

## 2013-06-06 DIAGNOSIS — M1710 Unilateral primary osteoarthritis, unspecified knee: Secondary | ICD-10-CM | POA: Diagnosis not present

## 2013-06-08 ENCOUNTER — Other Ambulatory Visit: Payer: Self-pay | Admitting: *Deleted

## 2013-06-08 ENCOUNTER — Telehealth: Payer: Self-pay | Admitting: *Deleted

## 2013-06-08 NOTE — Telephone Encounter (Signed)
Per Dr. [name] need to change pt's Hydralazine 25mg  to twice a day.  Change made.//AB/CMA

## 2013-06-13 DIAGNOSIS — IMO0002 Reserved for concepts with insufficient information to code with codable children: Secondary | ICD-10-CM | POA: Diagnosis not present

## 2013-06-13 DIAGNOSIS — M1710 Unilateral primary osteoarthritis, unspecified knee: Secondary | ICD-10-CM | POA: Diagnosis not present

## 2013-06-19 ENCOUNTER — Other Ambulatory Visit: Payer: Self-pay | Admitting: Internal Medicine

## 2013-06-19 DIAGNOSIS — H40019 Open angle with borderline findings, low risk, unspecified eye: Secondary | ICD-10-CM | POA: Diagnosis not present

## 2013-06-19 DIAGNOSIS — H2510 Age-related nuclear cataract, unspecified eye: Secondary | ICD-10-CM | POA: Diagnosis not present

## 2013-06-19 DIAGNOSIS — D869 Sarcoidosis, unspecified: Secondary | ICD-10-CM | POA: Diagnosis not present

## 2013-06-21 DIAGNOSIS — IMO0002 Reserved for concepts with insufficient information to code with codable children: Secondary | ICD-10-CM | POA: Diagnosis not present

## 2013-06-21 DIAGNOSIS — M1710 Unilateral primary osteoarthritis, unspecified knee: Secondary | ICD-10-CM | POA: Diagnosis not present

## 2013-06-21 NOTE — Telephone Encounter (Signed)
Med filled.  

## 2013-07-13 ENCOUNTER — Other Ambulatory Visit: Payer: Self-pay | Admitting: Internal Medicine

## 2013-07-17 MED ORDER — HYDRALAZINE HCL 25 MG PO TABS: ORAL_TABLET | ORAL | Status: DC

## 2013-07-17 NOTE — Telephone Encounter (Signed)
Rx sent to the pharmacy by e-script.//AB/CMA 

## 2013-07-18 NOTE — Telephone Encounter (Signed)
Spoke with the Tina Molina and informed her that she is to be taking Hydralazine 25mg  2 bid per Dr. [name].  Also informed her that a new rx was sent to her pharmacy.  Tina Molina understood and agreed.//AB/CMA

## 2013-08-02 DIAGNOSIS — IMO0002 Reserved for concepts with insufficient information to code with codable children: Secondary | ICD-10-CM | POA: Diagnosis not present

## 2013-08-02 DIAGNOSIS — M1710 Unilateral primary osteoarthritis, unspecified knee: Secondary | ICD-10-CM | POA: Diagnosis not present

## 2013-08-10 ENCOUNTER — Other Ambulatory Visit: Payer: Self-pay | Admitting: Internal Medicine

## 2013-08-10 NOTE — Telephone Encounter (Signed)
Rx sent to the pharmacy by e-script.  Informed the pt rx was refilled but she is due a complete physical.  Pt understood and agreed and a CPE was scheduled.//AB/CMA

## 2013-08-20 ENCOUNTER — Other Ambulatory Visit (INDEPENDENT_AMBULATORY_CARE_PROVIDER_SITE_OTHER): Payer: Medicare Other

## 2013-08-20 ENCOUNTER — Ambulatory Visit (INDEPENDENT_AMBULATORY_CARE_PROVIDER_SITE_OTHER): Payer: Medicare Other | Admitting: Internal Medicine

## 2013-08-20 ENCOUNTER — Encounter: Payer: Self-pay | Admitting: Internal Medicine

## 2013-08-20 VITALS — BP 150/84 | HR 88 | Temp 99.0°F | Resp 15 | Ht 64.0 in | Wt 214.0 lb

## 2013-08-20 DIAGNOSIS — I10 Essential (primary) hypertension: Secondary | ICD-10-CM | POA: Diagnosis not present

## 2013-08-20 DIAGNOSIS — E785 Hyperlipidemia, unspecified: Secondary | ICD-10-CM | POA: Diagnosis not present

## 2013-08-20 DIAGNOSIS — R7309 Other abnormal glucose: Secondary | ICD-10-CM

## 2013-08-20 DIAGNOSIS — Z0000 Encounter for general adult medical examination without abnormal findings: Secondary | ICD-10-CM | POA: Diagnosis not present

## 2013-08-20 DIAGNOSIS — E039 Hypothyroidism, unspecified: Secondary | ICD-10-CM

## 2013-08-20 LAB — LIPID PANEL
Cholesterol: 217 mg/dL — ABNORMAL HIGH (ref 0–200)
HDL: 61 mg/dL (ref >39.00)
LDL Cholesterol: 130 mg/dL — ABNORMAL HIGH (ref 0–99)
Total CHOL/HDL Ratio: 4
Triglycerides: 130 mg/dL (ref 0.0–149.0)
VLDL: 26 mg/dL (ref 0.0–40.0)

## 2013-08-20 LAB — HEMOGLOBIN A1C: Hgb A1c MFr Bld: 5.7 % (ref 4.6–6.5)

## 2013-08-20 LAB — TSH: TSH: 0.42 u[IU]/mL (ref 0.35–5.50)

## 2013-08-20 MED ORDER — CYCLOBENZAPRINE HCL 10 MG PO TABS: ORAL_TABLET | ORAL | Status: DC

## 2013-08-20 MED ORDER — HYDRALAZINE HCL 50 MG PO TABS: 50.0000 mg | ORAL_TABLET | Freq: Three times a day (TID) | ORAL | Status: DC

## 2013-08-20 NOTE — Assessment & Plan Note (Signed)
TSH goal = 1-3. 

## 2013-08-20 NOTE — Progress Notes (Signed)
   Subjective:    Patient ID: Tina Molina, female    DOB: [birth year], 68 y.o.   MRN: [id number]  HPI annual wellness exam   Review of Systems  Social history: no smoking or tobacco use; drinks coffee ( 3x week) and soda (3x week); occasionally a few alcoholic beverages; no abuse, no guns in home. Uses seatbelt. Has smoke alarms in house.  No foreign travel.  Exercise: no  Personal safety/fall risk: no falls since Oct 2013 when she had back surgery.  Hearing test: no  Healthcare POA or Living Will: does not have  Did spelling correctly. Could only remember 2/3 words.  Depression/anxiety assessment: PHQ-2 Total Score 0.  Tranfusion hx- had blood with back surgery  2013 Preventative health: colonoscopy- 7 years ago (normal), mammogram 2014, Pap smear 3 years ago  Eye exam: January 2015  Dentist: Fall 2014  Orientation: time, place, person    Musculoskeletal: has had several injections to left knee which helps the pain somewhat for a week or two. Right knee hurts also but she hasn't had injections. Has pain in both hips secondary to bone spurs per Dr. Wynelle Link.   HYPERTENSION:  Disease Monitoring: checks a few times/week  Blood pressure range/ average : average per BP machine is 158/83.  Medication Compliance: yes   HYPERLIPIDEMIA:  Disease Monitoring:  Medication Compliance: no statin   Chest pain, palpitations: exercise induced palpitations  Dyspnea: with exercise  Edema: in feet, ankles, hands progresses with increased activity  Claudication: no  Lightheadedness,Syncope: when she began hydralazine she had some dizzy spells. Has not happened recently.  Weight gain/loss: gained 15-16 lbs  Abd pain, bowel changes: no  Myalgias: no  Memory loss: no     HYPERGLYCEMIA w/o dx DM: Polyuria-yes; phagia - no; dipsia: no  Blurred vision /diplopia/lossof vision: no  Limb numbness/tingling/burning: left leg numbness since back surgery  Non healing skin lesions: no           Objective:   Physical Exam Constitutional: She is oriented to person, place, and time. She appears well-developed and well-nourished. Obese.No distress.  HENT:  Head: Normocephalic and atraumatic.  Right Ear: Hearing normal.  Left Ear: Hearing normal.  Mouth/Throat: Oropharynx is clear and moist. No oropharyngeal exudate.  Eyes: Conjunctivae and EOM are normal. Pupils are equal, round, and reactive to light.  Neck: No tracheal deviation present. No thyromegaly present.  Cardiovascular: Normal rate, regular rhythm and normal heart sounds. Exam reveals no gallop and no friction rub.  No murmur heard. DPP decreased; posterior tibial pulses excellent Pulmonary/Chest: Effort normal and breath sounds normal. No respiratory distress. She has no wheezes. She has no rales. She exhibits no tenderness.  Edema noted in feet and ankles. Abdominal: Soft. Bowel sounds are normal. She exhibits no distension. There is no tenderness.  Musculoskeletal:  Reduced ROM in shoulders, hips, neck. Fusiform knees with crepitus Lymphadenopathy:  She has no cervical or axillary adenopathy.  Neurological: She is alert and oriented to person, place, and time. No cranial nerve deficit. Coordination normal.  Patellar tendon reflex absent. Biceps reflex present 2+  Skin: Skin is warm and dry. She is not diaphoretic.  Psychiatric: She has a normal mood and affect.           Assessment & Plan:  #1 Medicare Wellness Exam; criteria met ; data entered #2 Problem List/Diagnoses reviewed Plan:  Assessments made/ Orders entered

## 2013-08-20 NOTE — Assessment & Plan Note (Addendum)
Lipids, LFTs overdue

## 2013-08-20 NOTE — Patient Instructions (Signed)

## 2013-08-20 NOTE — Progress Notes (Signed)
   Subjective:    Patient ID: Tina Molina, female    DOB: 1945/11/19, 68 y.o.   MRN: [id number]  HPI annual wellness exam    Review of Systems Social history: no smoking or tobacco use; drinks coffee ( 3x week) and soda (3x week); occasionally a few alcoholic beverages; no abuse, no guns in home. Uses seatbelt. Has smoke alarms in house.  No foreign travel. Exercise: no Personal safety/fall risk: no falls since Oct 2013 when she had back surgery. Hearing test: no Healthcare POA or Living Will: does not have Did spelling correctly. Could only remember 2/3 words. Depression/anxiety assessment: PHQ-2 Total Score 0. Tranfusion hx- had blood with back surgery Preventative health: colonoscopy- 7 years ago (normal), mammogram 2014, Pap smear 3 years ago Eye exam: January 2015 Dentist: Fall 2014 Orientation: time, place, person Muscoloskeletal: has had several injections to left knee which helps the pain somewhat for a week or two. Right knee hurts also but she hasn't had injections. Has pain in both hips secondary to bone spurs per Dr. Wynelle Link.  HYPERTENSION: Disease Monitoring: checks a few times/week Blood pressure range/ average : average per BP machine is 158/83. Medication Compliance: yes   HYPERLIPIDEMIA: Disease Monitoring:  Medication Compliance: na  Chest pain, palpitations: exercise induced palpitations      Dyspnea: with exercise Edema: in feet, ankles, hands progresses with increased activity Claudication: no Lightheadedness,Syncope: when she began hydralazine she had some dizzy spells. Has not happened recently. Weight gain/loss: gained 15-16 lbs Polyuria-yes; phagia - no; dipsia: no      Blurred vision /diplopia/lossof vision: no Limb numbness/tingling/burning: left leg numbness since back surgery Non healing skin lesions: no Abd pain, bowel changes: no  Myalgias: no Memory loss: no        Objective:   Physical Exam  Constitutional: She is  oriented to person, place, and time. She appears well-developed and well-nourished. No distress.  HENT:  Head: Normocephalic and atraumatic.  Right Ear: Hearing normal.  Left Ear: Hearing normal.  Mouth/Throat: Oropharynx is clear and moist. No oropharyngeal exudate.  Eyes: Conjunctivae and EOM are normal. Pupils are equal, round, and reactive to light.  Neck: No tracheal deviation present. No thyromegaly present.  Cardiovascular: Normal rate, regular rhythm and normal heart sounds.  Exam reveals no gallop and no friction rub.   No murmur heard. Pedal pulses and posterior tibial pulses faint.  Pulmonary/Chest: Effort normal and breath sounds normal. No respiratory distress. She has no wheezes. She has no rales. She exhibits no tenderness.  Abdominal: Soft. Bowel sounds are normal. She exhibits no distension. There is no tenderness.  Musculoskeletal:  Reduced ROM in shoulders, hips, neck.  Lymphadenopathy:    She has no cervical adenopathy.  Neurological: She is alert and oriented to person, place, and time. No cranial nerve deficit. Coordination normal.  Patellar tendon reflex absent. Biceps reflex present 2+  Skin: Skin is warm and dry. She is not diaphoretic.  Psychiatric: She has a normal mood and affect.          Assessment & Plan:

## 2013-08-20 NOTE — Assessment & Plan Note (Addendum)
Blood pressure goals reviewed. BMET done in 05/2013; results reviewed

## 2013-08-20 NOTE — Progress Notes (Signed)
Pre visit review using our clinic review tool, if applicable. No additional management support is needed unless otherwise documented below in the visit note. 

## 2013-08-20 NOTE — Assessment & Plan Note (Signed)
A1c , urine microalbumin 

## 2013-08-24 ENCOUNTER — Other Ambulatory Visit: Payer: Self-pay | Admitting: Internal Medicine

## 2013-08-30 DIAGNOSIS — Z6836 Body mass index (BMI) 36.0-36.9, adult: Secondary | ICD-10-CM | POA: Diagnosis not present

## 2013-08-30 DIAGNOSIS — M48062 Spinal stenosis, lumbar region with neurogenic claudication: Secondary | ICD-10-CM | POA: Diagnosis not present

## 2013-10-09 ENCOUNTER — Other Ambulatory Visit: Payer: Self-pay

## 2013-10-09 MED ORDER — CYCLOBENZAPRINE HCL 10 MG PO TABS: ORAL_TABLET | ORAL | Status: DC

## 2013-10-30 ENCOUNTER — Ambulatory Visit (INDEPENDENT_AMBULATORY_CARE_PROVIDER_SITE_OTHER): Payer: Medicare Other | Admitting: Internal Medicine

## 2013-10-30 ENCOUNTER — Encounter: Payer: Self-pay | Admitting: Internal Medicine

## 2013-10-30 ENCOUNTER — Other Ambulatory Visit (INDEPENDENT_AMBULATORY_CARE_PROVIDER_SITE_OTHER): Payer: Medicare Other

## 2013-10-30 VITALS — BP 120/66 | HR 98 | Temp 98.2°F | Resp 14 | Wt 209.4 lb

## 2013-10-30 DIAGNOSIS — IMO0002 Reserved for concepts with insufficient information to code with codable children: Secondary | ICD-10-CM | POA: Diagnosis not present

## 2013-10-30 DIAGNOSIS — E039 Hypothyroidism, unspecified: Secondary | ICD-10-CM | POA: Diagnosis not present

## 2013-10-30 DIAGNOSIS — R5381 Other malaise: Secondary | ICD-10-CM | POA: Diagnosis not present

## 2013-10-30 DIAGNOSIS — I10 Essential (primary) hypertension: Secondary | ICD-10-CM

## 2013-10-30 DIAGNOSIS — M5414 Radiculopathy, thoracic region: Secondary | ICD-10-CM | POA: Insufficient documentation

## 2013-10-30 DIAGNOSIS — R5383 Other fatigue: Secondary | ICD-10-CM

## 2013-10-30 LAB — CBC WITH DIFFERENTIAL/PLATELET
Basophils Absolute: 0 10*3/uL (ref 0.0–0.1)
Basophils Relative: 0.8 % (ref 0.0–3.0)
Eosinophils Absolute: 0.1 10*3/uL (ref 0.0–0.7)
Eosinophils Relative: 2.5 % (ref 0.0–5.0)
HCT: 29.8 % — ABNORMAL LOW (ref 36.0–46.0)
Hemoglobin: 9.8 g/dL — ABNORMAL LOW (ref 12.0–15.0)
Lymphocytes Relative: 55.4 % — ABNORMAL HIGH (ref 12.0–46.0)
Lymphs Abs: 3 10*3/uL (ref 0.7–4.0)
MCHC: 32.8 g/dL (ref 30.0–36.0)
MCV: 96.2 fl (ref 78.0–100.0)
Monocytes Absolute: 0.7 10*3/uL (ref 0.1–1.0)
Monocytes Relative: 13.4 % — ABNORMAL HIGH (ref 3.0–12.0)
Neutro Abs: 1.5 10*3/uL (ref 1.4–7.7)
Neutrophils Relative %: 27.9 % — ABNORMAL LOW (ref 43.0–77.0)
Platelets: 282 10*3/uL (ref 150.0–400.0)
RBC: 3.09 Mil/uL — ABNORMAL LOW (ref 3.87–5.11)
RDW: 13.6 % (ref 11.5–15.5)
WBC: 5.4 10*3/uL (ref 4.0–10.5)

## 2013-10-30 LAB — BASIC METABOLIC PANEL
BUN: 26 mg/dL — ABNORMAL HIGH (ref 6–23)
CO2: 29 mEq/L (ref 19–32)
Calcium: 9.5 mg/dL (ref 8.4–10.5)
Chloride: 103 mEq/L (ref 96–112)
Creatinine, Ser: 1.5 mg/dL — ABNORMAL HIGH (ref 0.4–1.2)
GFR: 46.24 mL/min — ABNORMAL LOW (ref >60.00)
Glucose, Bld: 106 mg/dL — ABNORMAL HIGH (ref 70–99)
Potassium: 4 mEq/L (ref 3.5–5.1)
Sodium: 137 mEq/L (ref 135–145)

## 2013-10-30 LAB — TSH: TSH: 0.96 u[IU]/mL (ref 0.35–4.50)

## 2013-10-30 NOTE — Progress Notes (Signed)
Pre visit review using our clinic review tool, if applicable. No additional management support is needed unless otherwise documented below in the visit note. 

## 2013-10-30 NOTE — Patient Instructions (Signed)

## 2013-10-30 NOTE — Progress Notes (Signed)
Subjective:    Patient ID: Tina Molina, female    DOB: [birth year], 68 y.o.   MRN: [id number]  HPI Pt presents today for fluctuating BPs. The pt has a history of uncontrolled HTN. She reports that in March she was started on hydralazine 50mg  TID. In May she reports having a telephone converstation with Dr. [name] at which point she was told to increase her hydralazine to 100mg  BID for high BP readings.   The pt is compliant with her anti hypertemsive medications. In May the pt was taking between 150mg -200mg  of hydralazine daily. Since the beginning of June she is routinely taking a total of 150 mg hydralazine daily.   The pt is checking her BP at least 2x/day and is varying the times at which she checks it.  She has brought with her blood pressure readings from 09/24/13-current. In May she reports her lowest BP reading of 81/45 and her highest reading of 204/104. Her average May reading was 120/70. In June her BP ranged from 103/60 to a high of 183/91. Her average BP in June was 140/77.   The pt does report symptoms of hypotension and HTN. She states a BP as low as 100s/60s makes her light headed and fatigued. She reports symptoms of throbbing pain behind her eyes and some blurred vision with a BP over 170s/90s.   Pt is not following a heart healthy. No formal exercise reported.   Family history:  Maternal: HTN  Paternal: HTN  Sister: HTN  Brothers x 4: HTN  No history of CVA    Review of Systems  Left inferior thoracic chest "cramping " with lateral waist flexion.  Significant fatigue & weakness. No fever, chills, significant weight change,  or night sweats   No chest pain, palpitations, racing, irregular rhythm, syncope, nausea, sweating, or claudication. Bilateral LE edema and bilateral hand edema   No cough, sputum production,hemoptysis,  dyspnea, paroxysmal nocturnal dyspnea, pleuritic chest pain, significant snoring, or  apnea    No heartburn,dysphagia, nausea and  vomiting,abdominal pain, change in bowels, anorexia, diarrhea, significant constipation, rectal bleeding, melena,  stool incontinence or jaundice No dysuria,hematuria, pyuria  No myalgias or muscle cramping, joint stiffness, joint swelling, joint color change, weakness, or cyanosis  No rash, pruritus, urticaria, or change in color or temperature of skin  No headache, vertigo, limb weakness, tremor, gait disturbance, seizures, memory loss, numbness or tingling No bruising, lymphadenopathy,or  abnormal clotting      Objective:   Physical Exam   There is some thinning of the eyebrows laterally.  An S4 is present without murmurs or gallops  She has  pain in her knees with range of motion.  Crepitus of knees present without effusion.  General appearance: adequately nourished w/o distress. Obesity present as per CDC Guidelines  Eyes: No conjunctival inflammation or scleral icterus is present.  Oral exam:  lips and gums are healthy appearing.There is no oropharyngeal erythema or exudate noted.   Heart:  Normal rate and regular rhythm. S1 and S2 normal Lungs:Chest clear to auscultation; no wheezes, rhonchi,rales ,or rubs present.No increased work of breathing.   Abdomen: bowel sounds normal, soft and non-tender without masses, organomegaly or hernias noted.  No guarding or rebound . No tenderness over the flanks to percussion  Musculoskeletal: Hands normal.Able to lie flat and sit up without help. Negative straight leg raising bilaterally . Gait normal  Skin:Warm & dry.  Intact without suspicious lesions or rashes   Lymphatic: No lymphadenopathy is noted about  the head, neck, axilla             Assessment & Plan:  #1 HTN ,adequate control #2 positional L thoracic radiculopathy #3 fatigue Plan: maintain Hydralazine 50 mg tid See orders

## 2013-10-30 NOTE — Progress Notes (Signed)
   Subjective:    Patient ID: Tina Molina, female    DOB: Apr 30, 1946, 68 y.o.   MRN: [id number]  HPI Pt presents today for fluctuating BPs. The pt has a history of uncontrolled HTN. She reports that in March she was started on hydralazine 50mg  TID. In May she reports having a telephone converstation with Dr. [name] at which point she was told to increase her hydralazine to 100mg  BID for high BP readings.     The pt is compliant with her anti hypertemsive medications. In may the pt was taking between 150mg -200mg  of hydralazine daily. Since the beginning of June she is routinely taking hydralazine 150mg  daily.  The pt is checking her BP at least 2x/day and is varying the times at which she checks it. She has brought with her blood pressure readings from 09/24/13-current. In May she reports her lowest BP reading of 81/45 and her highest reading of 204/104. Her average May reading was 120/70. In June her BP ranged from 103/60 to a high of 183/91. Her average BP in June was 140/77.  The pt does report symptoms of hypotension and HTN. She states a BP as low as 100s/60s makes her light headed and fatigued. She reports symptoms of throbbing pain behind her eyes and some blurred vision with a BP over 170s/90s.     Pt is not following a heart healthy. No formal exercise reported.    Family history:  Maternal: HTN  Paternal: HTN  Sister: HTN  Brothers x 4: HTN  No history of CVa   Review of Systems  HENT: Negative for tinnitus.   Respiratory: Negative for chest tightness and shortness of breath.   Cardiovascular: Negative for chest pain and palpitations.       Bilateral LE edema and bilateral hand edema       Objective:   Physical Exam Gen.: Healthy and well-nourished in appearance. Alert, appropriate and cooperative throughout exam.  Eyes: No corneal or conjunctival inflammation noted. Pupils equal round reactive to light and accommodation. Extraocular motion intact.   Lungs:  Normal respiratory effort; chest expands symmetrically. Lungs are clear to auscultation without rales, wheezes, or increased work of breathing.  Heart: Normal rate and rhythm. Normal S1 and S2. No gallop, click, or rub. No murmur.                                   Vascular: Carotid, radial artery pulses are full and equal. Decreased pedal pulses with mild bilateral pedal edema. No bruits present.  Neurologic: Alert and oriented x3. Deep tendon reflexes symmetrical and normal.                                                                               Assessment & Plan:  #1 uncontrolled HTN;

## 2013-11-03 ENCOUNTER — Other Ambulatory Visit: Payer: Self-pay | Admitting: Internal Medicine

## 2013-11-03 DIAGNOSIS — D649 Anemia, unspecified: Secondary | ICD-10-CM

## 2013-11-03 DIAGNOSIS — N289 Disorder of kidney and ureter, unspecified: Secondary | ICD-10-CM | POA: Insufficient documentation

## 2013-11-14 ENCOUNTER — Other Ambulatory Visit: Payer: Self-pay | Admitting: Internal Medicine

## 2013-11-14 NOTE — Telephone Encounter (Signed)
1/2-1 q 12 PRN only This medication is among those which experts have documented to have a very  high risk of affecting  mental  alertness  & balance. This results in increased risk of falling with serious health or life threatening injury. Such medication should be taken as infrequently as possible and @  the lowest possible dose.It should not be taken with alcohol, sedatives  or other agents which have a similar  adverse risk potential. These risks are greater as we age as there is decreased ability of the liver and kidneys to metabolize and excrete the medication, resulting in   increased blood levels of the active ingredient.

## 2013-11-19 ENCOUNTER — Other Ambulatory Visit: Payer: Self-pay

## 2013-11-19 ENCOUNTER — Other Ambulatory Visit: Payer: Self-pay | Admitting: Internal Medicine

## 2013-11-19 MED ORDER — CYCLOBENZAPRINE HCL 10 MG PO TABS: ORAL_TABLET | ORAL | Status: DC

## 2013-11-19 NOTE — Telephone Encounter (Signed)
OK X1 but see below  This medication is among those which experts have documented to have a very  high risk of affecting  mental  alertness  & balance. This results in increased risk of falling with serious health or life threatening injury. Such medication should be taken as infrequently as possible and @  the lowest possible dose.It should not be taken with alcohol, sedatives  or other agents which have a similar  adverse risk potential. These risks are greater as we age as there is decreased ability of the liver and kidneys to metabolize and excrete the medication, resulting in   increased blood levels of the active ingredient.

## 2013-11-29 ENCOUNTER — Other Ambulatory Visit: Payer: Self-pay | Admitting: Neurology

## 2013-11-30 NOTE — Telephone Encounter (Signed)
Last seen in June 2014

## 2013-12-20 ENCOUNTER — Other Ambulatory Visit: Payer: Self-pay | Admitting: Internal Medicine

## 2013-12-21 ENCOUNTER — Other Ambulatory Visit: Payer: Self-pay

## 2013-12-21 MED ORDER — CYCLOBENZAPRINE HCL 10 MG PO TABS: ORAL_TABLET | ORAL | Status: DC

## 2014-01-08 ENCOUNTER — Telehealth: Payer: Self-pay | Admitting: Neurology

## 2014-01-08 NOTE — Telephone Encounter (Signed)
Patient has not been seen in over 1 year.  I called back.  Patient said she will check her calendar and call us back to schedule appt.  States she will discuss drug change at appt, or will call us back to discuss change sooner if needed after she has an appt date set.

## 2014-01-08 NOTE — Telephone Encounter (Signed)
Patient requesting if she could switch from doxepin (SINEQUAN) 10 MG capsule back Ambien.  Please call anytime and leave detailed message if not available.

## 2014-01-09 ENCOUNTER — Telehealth: Payer: Self-pay | Admitting: Neurology

## 2014-01-09 NOTE — Telephone Encounter (Signed)
No medication order for a patient  Who was over 12 month not seen - this is medicare rule-  she can see Np and get her medication changed. Please let her know. CD

## 2014-01-09 NOTE — Telephone Encounter (Signed)
I called the patient back.  Got no answer.  Left message. 

## 2014-01-09 NOTE — Telephone Encounter (Signed)
Patient will return in 06/2013 as indicated per emr.  Requesting a Rx for Ambien due to doxepin (SINEQUAN) 10 MG capsule has worked in three weeks.  Please return call and leave detailed message on vm.

## 2014-01-09 NOTE — Telephone Encounter (Signed)
Patient is requesting a drug change from Doxepin to Ambien, since she feels Doxepin is no longer effective.  Last seen June 2014, has an appt scheduled in Feb 2016.  Please advise.

## 2014-01-14 ENCOUNTER — Ambulatory Visit (INDEPENDENT_AMBULATORY_CARE_PROVIDER_SITE_OTHER): Payer: Medicare Other | Admitting: Adult Health

## 2014-01-14 ENCOUNTER — Encounter: Payer: Self-pay | Admitting: Adult Health

## 2014-01-14 VITALS — BP 145/75 | HR 90 | Ht 64.0 in | Wt 205.0 lb

## 2014-01-14 DIAGNOSIS — G4700 Insomnia, unspecified: Secondary | ICD-10-CM | POA: Diagnosis not present

## 2014-01-14 DIAGNOSIS — H9319 Tinnitus, unspecified ear: Secondary | ICD-10-CM

## 2014-01-14 DIAGNOSIS — F5104 Psychophysiologic insomnia: Secondary | ICD-10-CM

## 2014-01-14 DIAGNOSIS — H9313 Tinnitus, bilateral: Secondary | ICD-10-CM

## 2014-01-14 MED ORDER — ZOLPIDEM TARTRATE 10 MG PO TABS: 10.0000 mg | ORAL_TABLET | Freq: Every evening | ORAL | Status: DC | PRN

## 2014-01-14 NOTE — Progress Notes (Signed)
I agree with the assessment and plan as directed by NP .The patient is known to me .   Naveah Brave, MD  

## 2014-01-14 NOTE — Patient Instructions (Signed)
Zolpidem tablets What is this medicine? ZOLPIDEM (zole PI dem) is used to treat insomnia. This medicine helps you to fall asleep and sleep through the night. This medicine may be used for other purposes; ask your health care provider or pharmacist if you have questions. COMMON BRAND NAME(S): Ambien What should I tell my health care provider before I take this medicine? They need to know if you have any of these conditions: -depression -history of a drug or alcohol abuse problem -liver disease -lung or breathing disease -suicidal thoughts -an unusual or allergic reaction to zolpidem, other medicines, foods, dyes, or preservatives -pregnant or trying to get pregnant -breast-feeding How should I use this medicine? Take this medicine by mouth with a glass of water. Follow the directions on the prescription label. It is better to take this medicine on an empty stomach and only when you are ready for bed. Do not take your medicine more often than directed. If you have been taking this medicine for several weeks and suddenly stop taking it, you may get unpleasant withdrawal symptoms. Your doctor or health care professional may want to gradually reduce the dose. Do not stop taking this medicine on your own. Always follow your doctor or health care professional's advice. A special MedGuide will be given to you by the pharmacist with each prescription and refill. Be sure to read this information carefully each time. Talk to your pediatrician regarding the use of this medicine in children. Special care may be needed. Overdosage: If you think you have taken too much of this medicine contact a poison control center or emergency room at once. NOTE: This medicine is only for you. Do not share this medicine with others. What if I miss a dose? This does not apply. This medicine should only be taken immediately before going to sleep. Do not take double or extra doses. What may interact with this  medicine? -herbal medicines like kava kava, melatonin, St. John's wort and valerian -medicines for fungal infections like ketoconazole, fluconazole, or itraconazole -medicines for treating depression or other mental problems -other medicines given for sleep -some medicines for Parkinson' s disease or other movement disorders -some medicines used to treat HIV infection or AIDS, like ritonavir This list may not describe all possible interactions. Give your health care provider a list of all the medicines, herbs, non-prescription drugs, or dietary supplements you use. Also tell them if you smoke, drink alcohol, or use illegal drugs. Some items may interact with your medicine. What should I watch for while using this medicine? Visit your doctor or health care professional for regular checks on your progress. Keep a regular sleep schedule by going to bed at about the same time each night. Avoid caffeine-containing drinks in the evening hours. When sleep medicines are used every night for more than a few weeks, they may stop working. Talk to your doctor if you still have trouble sleeping. Do not take this medicine unless you are able to get a full night's sleep before you must be active again. You may not be able to remember things that you do in the hours after you take this medicine. Some people have reported driving, making phone calls, or preparing and eating food while asleep after taking sleep medicine. Take this medicine right before going to sleep. Tell your doctor if you are have any problems with your memory. After you stop taking this medicine, you may have trouble falling asleep. This is called rebound insomnia. This problem usually goes away  on its own after 1 or 2 nights. You may get drowsy or dizzy. Do not drive, use machinery, or do anything that needs mental alertness until you know how this medicine affects you. Do not stand or sit up quickly, especially if you are an older patient. This  reduces the risk of dizzy or fainting spells. Alcohol may interfere with the effect of this medicine. Avoid alcoholic drinks. This medicine may cause a decrease in mental alertness the morning after use, even if you feel that you are fully awake. Tell your doctor if you will need to perform activities requiring full alertness, such as driving, the next morning after you have taken this medicine. If you or your family notice any changes in your behavior, or if you have any unusual or disturbing thoughts, call your doctor right away. What side effects may I notice from receiving this medicine? Side effects that you should report to your doctor or health care professional as soon as possible: -allergic reactions like skin rash, itching or hives, swelling of the face, lips, or tongue -changes in vision -confusion -depressed mood -feeling faint or lightheaded, falls -hallucinations -problems with balance, speaking, walking -restlessness, excitability, or feelings of agitation -unusual activities while asleep like driving, eating, making phone calls Side effects that usually do not require medical attention (report to your doctor or health care professional if they continue or are bothersome): -diarrhea -dizziness, or daytime drowsiness, sometimes called a hangover effect -headache This list may not describe all possible side effects. Call your doctor for medical advice about side effects. You may report side effects to FDA at 1-800-FDA-1088. Where should I keep my medicine? Keep out of the reach of children. This medicine can be abused. Keep your medicine in a safe place to protect it from theft. Do not share this medicine with anyone. Selling or giving away this medicine is dangerous and against the law. Store at room temperature between 20 and 25 degrees C (68 and 77 degrees F). Throw away any unused medicine after the expiration date. NOTE: This sheet is a summary. It may not cover all possible  information. If you have questions about this medicine, talk to your doctor, pharmacist, or health care provider.  2015, [employer]. (2012-04-18 16:54:48)

## 2014-01-14 NOTE — Progress Notes (Signed)
PATIENT: Tina Molina DOB: Jan 17, 1946  REASON FOR VISIT: follow up HISTORY FROM: patient  HISTORY OF PRESENT ILLNESS: Tina Molina is a 68 year old female with a history of chronic insomnia. She returns today for follow-up. She is currently taking doxepin but she states that it is not working that well. She states that in the last 3 weeks the medication has not been working as well as it did. She states some nights she is able to sleep while others she will not sleep at all.  She would like to switch back to [place]. She currently goes to bed around 1:00-2:00am and arises at 1:00pm.She states that she usually wakes up about every 2 hours. Sometimes she wakes up to use the bathroom and other times she just wakes up.  she states that occasionally her left leg will be numb but she states that it is due to her back surgery. She has also been having ringing in the ears (bilateral). She describes it as a "faint bell ringing." She states she only notices it at night. This has been occuring for a couple of weeks. She currently has some sinus/allergy symptoms. No new medical history since last seen.  HISTORY 11/03/12 (CD):  Tina Molina is a 68 y.o. female here for follow up on her sleep problems since 2012.Marland Kitchen  She has sarcoidosis, chronic lymphocytosis, normocytic anemia and transient leukopenia. Within the Epic system I found an entry of MGUS by Dr Marin Olp.  The patient has hypertension for many years but had a spell of malignant hypertension in 2013, is now on 5 different antihypertensives, creatinine was 1.3 at the time.  Her last visit in our office was on 08/05/2011 with C.Martin . HEENT problems of allergic rhinitis, runny nose, Chronic insomnia and she has been using Ambien 10 mg with good success.  She has rebound insomnia when not taking the medication she has also alternated with doxylamate , which is Unisom, and Doxepin .  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  She hasn't acquired circadian rhythm disorder and has also maintained an abnormal night and day rhythm, since her sister who Resides with her had been a night shift worker ,too - And stays awake at night as well, watching TV together.  She has used doxepin sleep and was a that has also worked quite well for her, she has used generic modafinil for the daytime(to stay awake) but had one episode of palpitation, it turned out this was on a day when she did not take modafinil.  Since March 2013 she has increasing arthritis pain she used NSAIDS for pain control , until blood pressure started to rise again. She reports that a kidney function evaluation and Doppler of the renal artery did not give evidence of a renal origin off her hypertension.  She is now longer taking 3 blood pressure medications but 5.  We are meeting today to address the chronic insomnia and medication risks in review of her underlying condition of pulmonary sarcoidosis, obesity and hypertension.  He underwent a back surgery since her last visit with [place] on 02/15/2012 and went into acute renal failure and respiratory failure, and finally discharged from the hospital in November she had to go through a long rehabilitation process .   REVIEW OF SYSTEMS: Full 14 system review of systems  performed and notable only for:  Constitutional: N/A  Eyes: eye itching, eye redness Ear/Nose/Throat: ringing in ears Skin: N/A  Cardiovascular: N/A  Respiratory: cough Gastrointestinal: N/A  Genitourinary: N/A Hematology/Lymphatic: N/A  Endocrine: N/A Musculoskeletal: muscle cramps Allergy/Immunology: N/A  Neurological: numbness Psychiatric: N/A Sleep: insomnia   ALLERGIES: Allergies  Allergen Reactions  . Penicillins     Intolerance or allergy not remembered; her sister stated penicillin caused "boils under  the skin"  . Shellfish Allergy Rash  . Sulfonamide Derivatives     Arthralgias; her sister stated that this caused a rash.  . Codeine     Nausea only with liquid codeine    HOME MEDICATIONS: Outpatient Prescriptions Prior to Visit  Medication Sig Dispense Refill  . aspirin 81 MG tablet Take 81 mg by mouth daily.       . brimonidine (ALPHAGAN P) 0.1 % SOLN Place 1 drop into both eyes 2 (two) times daily.      . budesonide-formoterol (SYMBICORT) 160-4.5 MCG/ACT inhaler Inhale 2 puffs into the lungs as needed. For wheezing      . carvedilol (COREG) 25 MG tablet take 1 tablet by mouth twice a day  180 tablet  1  . Cholecalciferol (VITAMIN D3) 1000 UNITS CAPS Take 1,000 Units by mouth daily.       . cloNIDine (CATAPRES) 0.1 MG tablet Take 1 tablet (0.1 mg total) by mouth 2 (two) times daily.  180 tablet  1  . cyclobenzaprine (FLEXERIL) 10 MG tablet 1/2-1 qhs prn only  30 tablet  0  . diazepam (VALIUM) 5 MG tablet take 1/2 to 1 tablet by mouth every 12 hours  60 tablet  0  . doxepin (SINEQUAN) 10 MG capsule Take 10 mg by mouth. 3 by mouth at bedtime      . doxepin (SINEQUAN) 10 MG capsule TAKE 3 CAPSULES BY MOUTH AT BEDTIME  90 capsule  0  . gabapentin (NEURONTIN) 300 MG capsule take 1 capsule by mouth every 8 hours if needed  90 capsule  5  . hydrALAZINE (APRESOLINE) 50 MG tablet Take 1 tablet (50 mg total) by mouth 3 (three) times daily.  90 tablet  5  . levothyroxine (SYNTHROID, LEVOTHROID) 150 MCG tablet take 1 tablet by mouth once daily EXCEPT 1/2 TABLET ON WEDNESDAYS  90 tablet  3  . losartan (COZAAR) 100 MG tablet take 1 tablet by mouth once daily  90 tablet  1  . Multiple Vitamin (MULTIVITAMIN WITH MINERALS) TABS Take 1 tablet by mouth daily.      Marland Kitchen oxyCODONE-acetaminophen (PERCOCET/ROXICET) 5-325 MG per tablet Take 1 tablet by mouth every 4 (four) hours as needed.  90 tablet  0  . ranitidine (ZANTAC) 150 MG tablet take 1 tablet by mouth twice a day  180 tablet  1  . vitamin E 400 UNIT  capsule Take 400 Units by mouth daily.        No facility-administered medications prior to visit.    PAST MEDICAL HISTORY: Past Medical History  Diagnosis Date  . Pneumonia 04/2010    OP  . Anemia     Associated with leukopenia and lymphocytosis. This is been evaluated by Dr. Marin Olp . Her platelet count has been normal  . Hypertension   . Thyroid disease   . Chronic back pain   . Sarcoidosis   . Lymphocytosis   . GERD (gastroesophageal reflux disease)   . Chronic kidney disease   . Renal insufficiency   . Arthritis   . MGUS (  monoclonal gammopathy of unknown significance) 08/03/2012    IgA 792 11/04/11    PAST SURGICAL HISTORY: Past Surgical History  Procedure Laterality Date  . Cholecystectomy    . Carpal tunnel release      Bilateral   . Cervical discectomy  06/2010    Dr.Pool  . Thyroidectomy  2003    For goiter  . Colonoscopy  2010  . Posterior lumbar fusion  02/29/2012    Dr Alyson Locket op respiratory & renal compromise    FAMILY HISTORY: Family History  Problem Relation Age of Onset  . Anemia Father   . Arthritis Father   . Heart failure Father 33    No CAD  . Diabetes Mother   . Arthritis Mother   . Glaucoma Mother   . Heart disease Mother 13    No CAD  . Hypertension Brother   . Anemia Brother   . Kidney disease Brother   . Hypertension Sister   . Kidney disease Sister   . Anemia Sister   . Diabetes Maternal Aunt   . Arthritis Paternal Grandmother     SOCIAL HISTORY: History   Social History  . Marital Status: Legally Separated    Spouse Name: N/A    Number of Children: 0  . Years of Education: 12   Occupational History  . Retired    Social History Main Topics  . Smoking status: Former Smoker    Types: Cigarettes    Quit date: 05/17/1997  . Smokeless tobacco: Never Used  . Alcohol Use: 0.6 oz/week    1 Glasses of wine per week     Comment: occasionally   . Drug Use: No  . Sexual Activity: Not on file   Other Topics Concern  .  Not on file   Social History Narrative   Patient is divorced and lives with her sister. Patient is retired.   Caffeine- sometimes - one cup   Right handed.      PHYSICAL EXAM  Filed Vitals:   01/14/14 1353  BP: 145/75  Pulse: 90  Height: 5\' 4"  (1.626 m)  Weight: 205 lb (92.987 kg)   Body mass index is 35.17 kg/(m^2).  Generalized: Well developed, in no acute distress  Ears: TMI intact, no cerumen buildup. Right ear canal slightly erythematous  Neurological examination  Mentation: Alert oriented to time, place, history taking. Follows all commands speech and language fluent Cranial nerve II-XII: Pupils were equal round reactive to light. Extraocular movements were full, visual field were full on confrontational test. Facial sensation and strength were normal.  Uvula tongue midline. Head turning and shoulder shrug  were normal and symmetric. Motor: The motor testing reveals 5 over 5 strength of all 4 extremities. Good symmetric motor tone is noted throughout.  Sensory: Sensory testing is intact to soft touch on all 4 extremities. No evidence of extinction is noted.  Coordination: Cerebellar testing reveals good finger-nose-finger and heel-to-shin bilaterally.  Gait and station: Gait is normal. Reflexes: Deep tendon reflexes are symmetric and normal bilaterally.     DIAGNOSTIC DATA (LABS, IMAGING, TESTING) - I reviewed patient records, labs, notes, testing and imaging myself where available.  Lab Results  Component Value Date   WBC 5.4 10/30/2013   HGB 9.8* 10/30/2013   HCT 29.8* 10/30/2013   MCV 96.2 10/30/2013   PLT 282.0 10/30/2013      Component Value Date/Time   NA 137 10/30/2013 1431   K 4.0 10/30/2013 1431   CL 103 10/30/2013 1431  CO2 29 10/30/2013 1431   GLUCOSE 106* 10/30/2013 1431   BUN 26* 10/30/2013 1431   CREATININE 1.5* 10/30/2013 1431   CREATININE 1.28* 08/04/2012 1030   CALCIUM 9.5 10/30/2013 1431   PROT 7.2 01/03/2013 1415   ALBUMIN 4.0 01/03/2013 1415   AST  22 01/03/2013 1415   ALT 19 01/03/2013 1415   ALKPHOS 95 01/03/2013 1415   BILITOT 0.2* 01/03/2013 1415   GFRNONAA 49* 03/06/2012 0500   GFRAA 57* 03/06/2012 0500   Lab Results  Component Value Date   CHOL 217* 08/20/2013   HDL 61.00 08/20/2013   LDLCALC 130* 08/20/2013   LDLDIRECT 156.5 05/27/2011   TRIG 130.0 08/20/2013   CHOLHDL 4 08/20/2013   Lab Results  Component Value Date   HGBA1C 5.7 08/20/2013   Lab Results  Component Value Date   [id number] 04/24/2012   Lab Results  Component Value Date   TSH 0.96 10/30/2013      ASSESSMENT AND PLAN 68 y.o. year old female  has a past medical history of Pneumonia (04/2010); Anemia; Hypertension; Thyroid disease; Chronic back pain; Sarcoidosis; Lymphocytosis; GERD (gastroesophageal reflux disease); Chronic kidney disease; Renal insufficiency; Arthritis; and MGUS (monoclonal gammopathy of unknown significance) (08/03/2012). here with:  1. Chronic insomnia 2. Tinnitus  Patient has been on 10 mg Ambien previously. I will D/C doxepin and start Ambien 10mg  at night for sleep PRN.  Patient has also has faint ringing in the ears. Previously blood work was unremarkable. She has been on aspirin for years. I recommended a CT of the brain to rule out any abnormalities. At this time she deferred. She wants to wait till she is over her "allergies" to see if that is the cause. I agree with that.  Patient should follow-up in 6 months or sooner if needed.    Ward Givens, MSN, NP-C 01/14/2014, 1:43 PM [facility] [address], [place], [place] [address] [phone]  Note: This document was prepared with digital dictation and possible smart phrase technology. Any transcriptional errors that result from this process are unintentional.

## 2014-01-15 ENCOUNTER — Ambulatory Visit: Payer: Medicare Other | Admitting: Adult Health

## 2014-02-15 ENCOUNTER — Other Ambulatory Visit: Payer: Self-pay

## 2014-02-16 MED ORDER — CYCLOBENZAPRINE HCL 10 MG PO TABS: ORAL_TABLET | ORAL | Status: DC

## 2014-02-16 NOTE — Telephone Encounter (Signed)
OK but  Only #15.Please emphasis prn only & can not be taken with any sleeping pill  Such as Ambien as may cause excessive sedation with increased risk of falls

## 2014-02-18 MED ORDER — CYCLOBENZAPRINE HCL 10 MG PO TABS: ORAL_TABLET | ORAL | Status: DC

## 2014-02-18 NOTE — Addendum Note (Signed)
Addended by: Roma Schanz R on: 02/18/2014 08:01 AM   Modules accepted: Orders

## 2014-02-19 ENCOUNTER — Other Ambulatory Visit (INDEPENDENT_AMBULATORY_CARE_PROVIDER_SITE_OTHER): Payer: Medicare Other

## 2014-02-19 ENCOUNTER — Encounter: Payer: Self-pay | Admitting: Internal Medicine

## 2014-02-19 ENCOUNTER — Ambulatory Visit (INDEPENDENT_AMBULATORY_CARE_PROVIDER_SITE_OTHER): Payer: Medicare Other | Admitting: Internal Medicine

## 2014-02-19 VITALS — BP 104/56 | HR 84 | Temp 98.5°F | Resp 14 | Wt 204.0 lb

## 2014-02-19 DIAGNOSIS — I10 Essential (primary) hypertension: Secondary | ICD-10-CM

## 2014-02-19 DIAGNOSIS — I952 Hypotension due to drugs: Secondary | ICD-10-CM

## 2014-02-19 DIAGNOSIS — M2550 Pain in unspecified joint: Secondary | ICD-10-CM

## 2014-02-19 DIAGNOSIS — J069 Acute upper respiratory infection, unspecified: Secondary | ICD-10-CM

## 2014-02-19 DIAGNOSIS — Z9189 Other specified personal risk factors, not elsewhere classified: Secondary | ICD-10-CM

## 2014-02-19 DIAGNOSIS — J209 Acute bronchitis, unspecified: Secondary | ICD-10-CM

## 2014-02-19 LAB — SEDIMENTATION RATE: Sed Rate: 56 mm/hr — ABNORMAL HIGH (ref 0–22)

## 2014-02-19 LAB — RHEUMATOID FACTOR: Rhuematoid fact SerPl-aCnc: 84 IU/mL — ABNORMAL HIGH (ref <=14)

## 2014-02-19 MED ORDER — AZITHROMYCIN 250 MG PO TABS: ORAL_TABLET | ORAL | Status: DC

## 2014-02-19 NOTE — Patient Instructions (Signed)
Plain Mucinex (NOT D) for thick secretions ;force NON dairy fluids .   Nasal cleansing in the shower as discussed with lather of mild shampoo.After 10 seconds wash off lather while  exhaling through nostrils. Make sure that all residual soap is removed to prevent irritation.  Flonase OR Nasacort AQ 1 spray in each nostril twice a day as needed. Use the "crossover" technique into opposite nostril spraying toward opposite ear @ 45 degree angle, not straight up into nostril.  Use a Neti pot daily only  as needed for significant sinus congestion; going from open side to congested side . Plain Allegra (NOT D )  160 daily , Loratidine 10 mg , OR Zyrtec 10 mg @ bedtime  as needed for itchy eyes & sneezing. As we discussed you are on multiple medications which experts have documented to have a very  high risk of affecting  mental  alertness  & balance. This results in increased risk of falling with serious health or life threatening injury. Such medication should be taken as infrequently as possible and @  the lowest possible dose.It should not be taken with alcohol, sedatives  or other agents which have a similar  adverse risk potential. These risks are greater as we age as there is decreased ability of the liver and kidneys to metabolize and excrete the medication, resulting in   increased blood levels of the active ingredient.

## 2014-02-19 NOTE — Progress Notes (Signed)
   Subjective:    Patient ID: Tina Molina, female    DOB: 1945-10-20, 68 y.o.   MRN: [id number]  HPI  She's had RTI symptoms for 3-4 weeks. Initially this was rhinitis with head congestion. She has developed sputum production with brown material which she describes as clearing her throat. She's also had itchy, watery eyes. She will have postnasal drainage which is described as clear to occasionally brown.  Her list of medications which have increased risk of impact on mental alertness and increased risk of falls were reviewed and discussed in detail. The pathophysiology of such risk was explained. This is based on the Beer's studies. Prescribing physician was identified for each agent.    Review of Systems  Frontal headache, facial pain ,  dental pain, sore throat , otic pain or otic discharge denied. She does have some dry mouth symptoms. She is on Clonidine. No fever , chills or sweats.  She is concerned as her blood pressure has been low.  She also has swelling and pain in the PIP joints of both hands.      Objective:   Physical Exam General appearance:weight excess ;well nourished; no acute distress or increased work of breathing is present.  No  lymphadenopathy about the head, neck, or axilla noted.   Eyes: No conjunctival inflammation or lid edema is present. There is no scleral icterus.  Ears:  External ear exam shows no significant lesions or deformities.  Otoscopic examination reveals clear canals, tympanic membranes are intact bilaterally without bulging, retraction, inflammation or discharge.  Nose:  External nasal examination shows no deformity or inflammation. Nasal mucosa are dry & erythematous without lesions or exudates. No septal dislocation or deviation.No obstruction to airflow.   Oral exam: Dental hygiene is good; lips and gums are healthy appearing.There is no oropharyngeal erythema or exudate noted.   Neck:  No deformities, thyromegaly, masses, or  tenderness noted.      Heart:  Normal rate and regular rhythm. S1 and S2 normal without gallop, murmur, click, rub or other extra sounds.   Lungs:Chest clear to auscultation; no wheezes, rhonchi,rales ,or rubs present.No increased work of breathing.    Extremities:  No cyanosis, edema, or clubbing  noted .Some fusiform PIP changes   Skin: Warm & dry w/o jaundice or tenting.         Assessment & Plan:  #1 acute bronchitis w/o bronchospasm #2 URI, acute #3 polypharmacy with increased fall risk #4 hypotension which increases risk of #3 #5 arthralgias multiple joints; R/O RA Plan: See orders and recommendations . Clonidine will be held and blood pressure monitored. Sedimentation rate and RA factor will be checked.

## 2014-02-19 NOTE — Progress Notes (Signed)
Pre visit review using our clinic review tool, if applicable. No additional management support is needed unless otherwise documented below in the visit note. 

## 2014-02-20 ENCOUNTER — Other Ambulatory Visit: Payer: Self-pay | Admitting: Internal Medicine

## 2014-02-20 DIAGNOSIS — M2550 Pain in unspecified joint: Secondary | ICD-10-CM

## 2014-02-20 DIAGNOSIS — R768 Other specified abnormal immunological findings in serum: Secondary | ICD-10-CM

## 2014-02-20 DIAGNOSIS — M069 Rheumatoid arthritis, unspecified: Secondary | ICD-10-CM | POA: Insufficient documentation

## 2014-02-25 ENCOUNTER — Other Ambulatory Visit: Payer: Self-pay

## 2014-02-25 DIAGNOSIS — I10 Essential (primary) hypertension: Secondary | ICD-10-CM

## 2014-02-25 MED ORDER — HYDRALAZINE HCL 50 MG PO TABS: 50.0000 mg | ORAL_TABLET | Freq: Three times a day (TID) | ORAL | Status: DC

## 2014-02-28 DIAGNOSIS — M5415 Radiculopathy, thoracolumbar region: Secondary | ICD-10-CM | POA: Diagnosis not present

## 2014-02-28 DIAGNOSIS — Z6841 Body Mass Index (BMI) 40.0 and over, adult: Secondary | ICD-10-CM | POA: Diagnosis not present

## 2014-03-01 ENCOUNTER — Other Ambulatory Visit: Payer: Self-pay

## 2014-03-21 DIAGNOSIS — M5415 Radiculopathy, thoracolumbar region: Secondary | ICD-10-CM | POA: Diagnosis not present

## 2014-03-21 DIAGNOSIS — M5124 Other intervertebral disc displacement, thoracic region: Secondary | ICD-10-CM | POA: Diagnosis not present

## 2014-03-28 DIAGNOSIS — M5136 Other intervertebral disc degeneration, lumbar region: Secondary | ICD-10-CM | POA: Diagnosis not present

## 2014-03-28 DIAGNOSIS — M2550 Pain in unspecified joint: Secondary | ICD-10-CM | POA: Diagnosis not present

## 2014-03-28 DIAGNOSIS — M150 Primary generalized (osteo)arthritis: Secondary | ICD-10-CM | POA: Diagnosis not present

## 2014-03-28 DIAGNOSIS — M79643 Pain in unspecified hand: Secondary | ICD-10-CM | POA: Diagnosis not present

## 2014-04-01 ENCOUNTER — Other Ambulatory Visit: Payer: Self-pay

## 2014-04-01 MED ORDER — TIZANIDINE HCL 2 MG PO TABS: 2.0000 mg | ORAL_TABLET | Freq: Three times a day (TID) | ORAL | Status: DC | PRN

## 2014-04-01 NOTE — Telephone Encounter (Signed)
Received a fax from [employer] on [place] that Cyclobenzaprine 10 mg is no longer covered under patient's insurance plan. Per Dr [name] patient is to switch to Tizanidine 2 mg #30 1 every 8 hours prn

## 2014-04-10 DIAGNOSIS — M0589 Other rheumatoid arthritis with rheumatoid factor of multiple sites: Secondary | ICD-10-CM | POA: Diagnosis not present

## 2014-04-10 DIAGNOSIS — M5136 Other intervertebral disc degeneration, lumbar region: Secondary | ICD-10-CM | POA: Diagnosis not present

## 2014-04-10 DIAGNOSIS — M150 Primary generalized (osteo)arthritis: Secondary | ICD-10-CM | POA: Diagnosis not present

## 2014-04-22 ENCOUNTER — Other Ambulatory Visit: Payer: Self-pay

## 2014-04-22 MED ORDER — CYCLOBENZAPRINE HCL 10 MG PO TABS: ORAL_TABLET | ORAL | Status: DC

## 2014-04-22 NOTE — Telephone Encounter (Signed)
Do not see this on current medication list

## 2014-04-22 NOTE — Telephone Encounter (Signed)
#  10 If symptoms are persisting; non operative back specialist needs to be seen This medication is among those which experts have documented to have a very  high risk of affecting  mental  alertness  & balance. This results in increased risk of falling with serious health or life threatening injury. Such medication should be taken as infrequently as possible and @  the lowest possible dose.

## 2014-04-29 DIAGNOSIS — M0589 Other rheumatoid arthritis with rheumatoid factor of multiple sites: Secondary | ICD-10-CM | POA: Diagnosis not present

## 2014-05-13 DIAGNOSIS — M0589 Other rheumatoid arthritis with rheumatoid factor of multiple sites: Secondary | ICD-10-CM | POA: Diagnosis not present

## 2014-05-18 ENCOUNTER — Other Ambulatory Visit: Payer: Self-pay | Admitting: Neurology

## 2014-05-20 NOTE — Telephone Encounter (Signed)
Rx signed and faxed.

## 2014-06-13 DIAGNOSIS — M0589 Other rheumatoid arthritis with rheumatoid factor of multiple sites: Secondary | ICD-10-CM | POA: Diagnosis not present

## 2014-06-24 ENCOUNTER — Other Ambulatory Visit: Payer: Self-pay

## 2014-06-24 MED ORDER — LOSARTAN POTASSIUM 100 MG PO TABS: 100.0000 mg | ORAL_TABLET | Freq: Every day | ORAL | Status: DC

## 2014-06-24 MED ORDER — RANITIDINE HCL 150 MG PO TABS
150.0000 mg | ORAL_TABLET | Freq: Two times a day (BID) | ORAL | Status: DC
Start: 2014-06-24 — End: 2014-12-24

## 2014-06-24 MED ORDER — CARVEDILOL 25 MG PO TABS: 25.0000 mg | ORAL_TABLET | Freq: Two times a day (BID) | ORAL | Status: DC

## 2014-06-27 ENCOUNTER — Other Ambulatory Visit: Payer: Self-pay

## 2014-06-27 ENCOUNTER — Encounter: Payer: Self-pay | Admitting: Internal Medicine

## 2014-06-27 ENCOUNTER — Ambulatory Visit (INDEPENDENT_AMBULATORY_CARE_PROVIDER_SITE_OTHER): Payer: Medicare Other | Admitting: Internal Medicine

## 2014-06-27 VITALS — BP 128/60 | HR 96 | Temp 98.1°F | Resp 14 | Ht 64.0 in | Wt 201.2 lb

## 2014-06-27 DIAGNOSIS — J069 Acute upper respiratory infection, unspecified: Secondary | ICD-10-CM

## 2014-06-27 DIAGNOSIS — R05 Cough: Secondary | ICD-10-CM

## 2014-06-27 DIAGNOSIS — R059 Cough, unspecified: Secondary | ICD-10-CM

## 2014-06-27 DIAGNOSIS — M069 Rheumatoid arthritis, unspecified: Secondary | ICD-10-CM

## 2014-06-27 MED ORDER — DOXYCYCLINE HYCLATE 100 MG PO TABS: 100.0000 mg | ORAL_TABLET | Freq: Two times a day (BID) | ORAL | Status: DC

## 2014-06-27 NOTE — Patient Instructions (Signed)

## 2014-06-27 NOTE — Progress Notes (Signed)
   Subjective:    Patient ID: Tina Molina, female    DOB: June 21, 1945, 69 y.o.   MRN: [id number]  HPI  She states she's had intermittent head and chest congestion over the last 3 months. She took Z-Pak but had to stop this because of itching.  She describes intermittent brown secretions when she does saline nasal hygiene. She's also taking Allegra and using Afrin nasal spray .She recently did have some epistaxis from the right nare.  She describes minor sneezing, sweats, and slight wheezing.  Review of Systems She denies frontal headache, facial pain, fever, or chills.  She's had no otic pain or otic discharge.  The cough is not associated with shortness of breath. She denies other extrinsic symptoms such as itchy, watery eyes.     Objective:   Physical Exam  Positive or pertinent findings include: The nasal septum is markedly dry and erythematous, especially on the right.  She has a grade 1 systolic murmur.  General appearance:Adequately nourished; no acute distress or increased work of breathing is present.  No  lymphadenopathy about the head, neck, or axilla noted.  Eyes: No conjunctival inflammation or lid edema is present. There is no scleral icterus. Ears:  External ear exam shows no significant lesions or deformities.  Otoscopic examination reveals clear canals, tympanic membranes are intact bilaterally without bulging, retraction, inflammation or discharge. Nose:  External nasal examination shows no deformity or inflammation.  No septal dislocation or deviation.No obstruction to airflow.  Oral exam: Dental hygiene is good; lips and gums are healthy appearing.There is no oropharyngeal erythema or exudate noted.  Neck:  No deformities, thyromegaly, masses, or tenderness noted.   Supple with full range of motion without pain.  Heart:  Normal rate and regular rhythm. S1 and S2 normal without gallop,  click, rub or other extra sounds.  Lungs:Chest clear to auscultation;  no wheezes, rhonchi,rales ,or rubs present. Extremities:  No cyanosis, edema, or clubbing  noted  Skin: Warm & dry  tenting.       Assessment & Plan:  #1 rhinosinusitis with bronchitis vs rhinitis induced cough #2 RA on immunosuppressant parenterally  Plan: Nasal hygiene interventions discussed. See prescription medications

## 2014-06-27 NOTE — Progress Notes (Signed)
Pre visit review using our clinic review tool, if applicable. No additional management support is needed unless otherwise documented below in the visit note. 

## 2014-06-27 NOTE — Telephone Encounter (Signed)
OK X 1; she needs to follow up with Dr Trenton Gammon

## 2014-06-28 MED ORDER — CYCLOBENZAPRINE HCL 10 MG PO TABS: ORAL_TABLET | ORAL | Status: DC

## 2014-07-09 ENCOUNTER — Ambulatory Visit: Payer: Medicare Other | Admitting: Adult Health

## 2014-07-31 ENCOUNTER — Telehealth: Payer: Self-pay

## 2014-07-31 NOTE — Telephone Encounter (Signed)
Pt states she will call back to schedule appt to get flu vaccine before 3/31

## 2014-08-01 DIAGNOSIS — I10 Essential (primary) hypertension: Secondary | ICD-10-CM | POA: Diagnosis not present

## 2014-08-01 DIAGNOSIS — M5415 Radiculopathy, thoracolumbar region: Secondary | ICD-10-CM | POA: Diagnosis not present

## 2014-08-01 DIAGNOSIS — Z6839 Body mass index (BMI) 39.0-39.9, adult: Secondary | ICD-10-CM | POA: Diagnosis not present

## 2014-08-06 DIAGNOSIS — M5416 Radiculopathy, lumbar region: Secondary | ICD-10-CM | POA: Diagnosis not present

## 2014-08-07 DIAGNOSIS — M0589 Other rheumatoid arthritis with rheumatoid factor of multiple sites: Secondary | ICD-10-CM | POA: Diagnosis not present

## 2014-08-14 ENCOUNTER — Ambulatory Visit (INDEPENDENT_AMBULATORY_CARE_PROVIDER_SITE_OTHER): Payer: Medicare Other | Admitting: Neurology

## 2014-08-14 ENCOUNTER — Encounter: Payer: Self-pay | Admitting: Neurology

## 2014-08-14 VITALS — BP 195/93 | HR 100 | Resp 16 | Ht 64.0 in | Wt 206.8 lb

## 2014-08-14 DIAGNOSIS — G4720 Circadian rhythm sleep disorder, unspecified type: Secondary | ICD-10-CM

## 2014-08-14 NOTE — Progress Notes (Signed)
[facility]  Provider:  Dr Jabree Rebert Referring Provider: Hendricks Limes, MD Primary Care Physician:  Unice Cobble, MD  Chief Complaint  Patient presents with  . RV chronic insomnia    Rm 10, alone    HPI:  Tina Molina is a 69 y.o. female here for follow up on her sleep problems since 2012 and was last seen by  Ward Givens, np.   She has sarcoidosis, chronic lymphocytosis, normocytic anemia and transient leukopenia.  MGUS followed by Dr Marin Olp.  The patient has hypertension for many years but had a spell of malignant hypertension in 2013, is now on 5 different antihypertensives, creatinine was 1.3 at the time.   allergic rhinitis, runny nose,  Chronic  insomnia and she has been using Ambien 10 mg with good success.  She has rebound insomnia when not taking the medication she has also alternated with doxylamate , which is Unisom, and Doxepin. Tina Molina tests movements tells me that she still sleeping usually in the dental falling asleep in front of the running TV which of course is not optimal and the treatment of insomnia. I will give her today a booklet about insomnia that suggest some sleep habits to change she also has a circadian rhythm disorder we know that from her years of shift work. Often booklet with explanations regarding this as well. I will be refilling her Ambien today. She advised me that she has started the treatment of rheumatoid arthritis with Dr. Amil Amen and she is now taking Remicade infusions.  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 She hasn't acquired circadian rhythm disorder and has also maintained an abnormal night and day rhythm,  since her sister who  Resides with her had been a night shift  worker ,too -  And stays  awake at night as well, watching TV together. She has used doxepin sleep and was a that has also worked quite well for her,  she has used generic modafinil for the daytime(to stay awake) but had one episode of palpitation, it turned out this was on a day when she did not take modafinil.Since March 2013 she has increasing arthritis pain she used NSAIDS for  pain control , until  blood pressure started to rise  again. She reports that a kidney function evaluation and Doppler of the renal artery did not give evidence of a renal origin off her hypertension. She is now longer taking 3 blood pressure medications but 5. We are  meeting today to address the chronic insomnia and medication risks in review of her underlying condition of pulmonary sarcoidosis, obesity and hypertension, underwent a back surgery since her last visit with [place] on 02/15/2012 , and went into acute renal failure and respiratory failure,  and  Was finally discharged from the hospital in November  2013 she had to go through a long rehabilitation process . Last visit 2014 with Dr. Brett Fairy, patient has chronic insomnia related to her circadian rhythm disorder,  attributed to her shift work history of almost 3 decades. The hygiene behavior modifications have been discussed and partially implemented. The patient also is more related to lose weight but has been experiencing difficulties to exercise do to her a knee injury and back pain. The  patient's that failed to mirtazapine and Elavil and had nightmares on these working well for her our surgery milligrams of a generic pills of doxepin. None The patient is sarcoidosis pulmonary with dry eyes and CNS manifestations doxepin is worsening the dry eye component and dry mouth complement The patient is on multiple antihypertensives : fall risk is elevated at 7 point she is encouraged to do daily exercises with the pulmonary or cardial rehabilitation unit.     Review of  Systems: Out of a complete 14 system review, the patient complains of only the following symptoms, and all other reviewed systems are negative.  insomnia , she had a back surgery was in 2013, and she feels she has returned to the same symptoms,. MRI scheduled for 08-15-14 . Back pain  Related to rheumatoid and osteo arthritis, the patient is fatigued, still endorses daytime sleepiness . 08-14-14  Epworth score  Is 5 , her fatigue severity score is 52 up form 43 points.    History   Social History  . Marital Status: Legally Separated    Spouse Name: N/A  . Number of Children: 0  . Years of Education: 12   Occupational History  . Retired    Social History Main Topics  . Smoking status: Former Smoker    Types: Cigarettes    Quit date: 05/17/1997  . Smokeless tobacco: Never Used  . Alcohol Use: 0.6 oz/week    1 Glasses of wine per week     Comment: occasionally   . Drug Use: No  . Sexual Activity: Not on file   Other Topics Concern  . Not on file   Social History Narrative   Patient is divorced and lives with her sister. Patient is retired.   Caffeine- sometimes - one cup   Right handed.    Family History  Problem Relation Age of Onset  . Anemia Father   . Arthritis Father   . Heart failure Father 91    No CAD  . Diabetes Mother   . Arthritis Mother   . Glaucoma Mother   . Heart disease Mother 9    No CAD  . Hypertension Brother   . Anemia Brother   . Kidney disease Brother   . Hypertension Sister   . Kidney disease Sister   . Anemia Sister   . Diabetes Maternal Aunt   . Arthritis Paternal Grandmother     Past Medical History  Diagnosis Date  . Pneumonia 04/2010    OP  . Anemia     Associated with leukopenia and lymphocytosis. This is been evaluated by Dr. Marin Olp . Her platelet count has been normal  . Hypertension   . Thyroid disease   . Chronic back pain   . Sarcoidosis   . Lymphocytosis   . GERD (gastroesophageal reflux disease)   . Chronic  kidney disease   . Renal insufficiency   . Arthritis   . MGUS (monoclonal gammopathy of unknown significance) 08/03/2012    IgA 792 11/04/11    Past Surgical History  Procedure Laterality Date  . Cholecystectomy    . Carpal tunnel release      Bilateral   . Cervical discectomy  06/2010    Dr.Pool  . Thyroidectomy  2003    For goiter  . Colonoscopy  2010  . Posterior lumbar fusion  02/29/2012    Dr Alyson Locket op respiratory & renal compromise    Current Outpatient Prescriptions  Medication Sig Dispense Refill  . aspirin 81  MG tablet Take 81 mg by mouth daily.     . budesonide-formoterol (SYMBICORT) 160-4.5 MCG/ACT inhaler Inhale 2 puffs into the lungs as needed. For wheezing    . carvedilol (COREG) 25 MG tablet Take 1 tablet (25 mg total) by mouth 2 (two) times daily. 180 tablet 1  . Cholecalciferol (VITAMIN D3) 1000 UNITS CAPS Take 1,000 Units by mouth daily.     . cyclobenzaprine (FLEXERIL) 10 MG tablet TAKE 1/2 TO 1 TABLET BY MOUTH AT BEDTIME IF NEEDED DO NOT TAKE WITH AMBIEN OR OTHER SLEEP AID 10 tablet 0  . diazepam (VALIUM) 5 MG tablet take 1/2 to 1 tablet by mouth every 12 hours 60 tablet 0  . gabapentin (NEURONTIN) 300 MG capsule take 1 capsule by mouth every 8 hours if needed 90 capsule 5  . hydrALAZINE (APRESOLINE) 50 MG tablet Take 1 tablet (50 mg total) by mouth 3 (three) times daily. 90 tablet 5  . inFLIXimab (REMICADE) 100 MG injection Inject into the vein. Per Dr. Amil Amen, infused at his office.    Marland Kitchen levothyroxine (SYNTHROID, LEVOTHROID) 150 MCG tablet take 1 tablet by mouth once daily EXCEPT 1/2 TABLET ON WEDNESDAYS 90 tablet 3  . losartan (COZAAR) 100 MG tablet Take 1 tablet (100 mg total) by mouth daily. 90 tablet 1  . Multiple Vitamin (MULTIVITAMIN WITH MINERALS) TABS Take 1 tablet by mouth daily.    Marland Kitchen oxyCODONE-acetaminophen (PERCOCET/ROXICET) 5-325 MG per tablet Take 1 tablet by mouth every 4 (four) hours as needed. 90 tablet 0  . predniSONE (STERAPRED UNI-PAK) 5  MG TABS tablet See admin instructions.  0  . ranitidine (ZANTAC) 150 MG tablet Take 1 tablet (150 mg total) by mouth 2 (two) times daily. 180 tablet 1  . spironolactone (ALDACTONE) 50 MG tablet     . vitamin E 400 UNIT capsule Take 400 Units by mouth daily.     Marland Kitchen zolpidem (AMBIEN) 10 MG tablet TAKE 1 TABLET BY MOUTH AT BEDTIME AS NEEDED FOR SLEEP 30 tablet 5  . [DISCONTINUED] modafinil (PROVIGIL) 100 MG tablet Take 100 mg by mouth daily.    . [DISCONTINUED] potassium chloride (MICRO-K) 10 MEQ CR capsule Take 1 capsule (10 mEq total) by mouth 2 (two) times daily. 180 capsule 1   No current facility-administered medications for this visit.    Allergies as of 08/14/2014 - Review Complete 08/14/2014  Allergen Reaction Noted  . Penicillins    . Shellfish allergy Rash 08/11/2011  . Sulfonamide derivatives    . Azithromycin  06/27/2014  . Codeine  10/16/2009    Vitals: BP 195/93 mmHg  Pulse 100  Resp 16  Ht 5\' 4"  (1.626 m)  Wt 206 lb 12.8 oz (93.804 kg)  BMI 35.48 kg/m2 Last Weight:  Wt Readings from Last 1 Encounters:  08/14/14 206 lb 12.8 oz (93.804 kg)   Last Height:   Ht Readings from Last 1 Encounters:  08/14/14 5\' 4"  (1.626 m)    Physical exam:  General: The patient is awake, alert and appears not in acute distress. The patient is well groomed. Head: Normocephalic, atraumatic. Neck is supple. Mallampati3 neck circumference: 14.5 inches , no nasal deviation. No retrognathia.  Cardiovascular:  Regular rate and rhythm, without  murmurs or carotid bruit, and without distended neck veins. Respiratory: Lungs are clear to auscultation. Skin:  Without evidence of edema, or rash Trunk: BMI is elevated and patient  has normal posture. Neurologic exam : The patient is awake and alert, oriented to place and time.  Memory subjective  described as intact. There is a normal attention span & concentration ability. Speech is fluent without  dysarthria, dysphonia or aphasia. Mood and affect  are appropriate. Cranial nerves: Pupils are equal and briskly reactive to light. Funduscopic exam without  evidence of pallor or edema. Extraocular movements  in vertical and horizontal planes intact and without nystagmus. Visual fields by finger perimetry are intact. Hearing to finger rub intact. Facial sensation intact to fine touch. Facial motor strength is symmetric and tongue and uvula move midline.   Motor exam:   Normal tone and normal muscle bulk and symmetric normal strength in upper extremities. Torn meniscus made ROM limited, left knee with crepitation.  Sensory:  Fine touch, pinprick and vibration were tested in all extremities,  Numbness in both feet, and left L4-5 s1 dermatome.  Proprioception is tested in the upper extremities only. This was normal. Coordination: Rapid alternating movements in the fingers/hands is normal, without evidence of ataxia, dysmetria or tremor. Gait and station: Patient walks without assistive device. Strength within normal limits. Stance is stable and normal. Tandem gait is slowed, deferred- unfragmented turns. Romberg testing is normal.   Assessment :  patient with chronic insomnia related to a long-standing shift work sleep disorder-circadian rhythm disorder. In addition high fatigue due to autoimmune disease-rheumatoid arthritis. The high degree of fatigue as reflected and the fatigue severity scale she is not excessively daytime sleepy anymore. I refilled her Ambien which she is taking chronically. I gave her information booklets for shift work disorder as well as for insomnia.  I asked her today about any advanced directives that she may have to establish. She stated that she has none at this time and place, but then she also told me that she is now him supposedly legally divorced but she has never seen the divorce- decree.  Plan : patient needs to clarify her legal situation, as she is legally separated or divorced?  Rv yearly  For refills.

## 2014-08-15 DIAGNOSIS — M5415 Radiculopathy, thoracolumbar region: Secondary | ICD-10-CM | POA: Diagnosis not present

## 2014-08-15 DIAGNOSIS — M4326 Fusion of spine, lumbar region: Secondary | ICD-10-CM | POA: Diagnosis not present

## 2014-08-22 ENCOUNTER — Other Ambulatory Visit: Payer: Self-pay | Admitting: Internal Medicine

## 2014-08-22 ENCOUNTER — Other Ambulatory Visit: Payer: Self-pay

## 2014-08-22 DIAGNOSIS — I10 Essential (primary) hypertension: Secondary | ICD-10-CM

## 2014-08-22 MED ORDER — HYDRALAZINE HCL 50 MG PO TABS: 50.0000 mg | ORAL_TABLET | Freq: Three times a day (TID) | ORAL | Status: DC

## 2014-08-22 MED ORDER — SPIRONOLACTONE 50 MG PO TABS: 50.0000 mg | ORAL_TABLET | Freq: Once | ORAL | Status: DC

## 2014-08-22 NOTE — Telephone Encounter (Signed)
Advised patient that refill for aldactone has been called in to pharmacy

## 2014-09-12 DIAGNOSIS — M1712 Unilateral primary osteoarthritis, left knee: Secondary | ICD-10-CM | POA: Diagnosis not present

## 2014-09-20 DIAGNOSIS — Z6838 Body mass index (BMI) 38.0-38.9, adult: Secondary | ICD-10-CM | POA: Diagnosis not present

## 2014-09-20 DIAGNOSIS — M5415 Radiculopathy, thoracolumbar region: Secondary | ICD-10-CM | POA: Diagnosis not present

## 2014-09-24 DIAGNOSIS — M150 Primary generalized (osteo)arthritis: Secondary | ICD-10-CM | POA: Diagnosis not present

## 2014-09-24 DIAGNOSIS — M5136 Other intervertebral disc degeneration, lumbar region: Secondary | ICD-10-CM | POA: Diagnosis not present

## 2014-09-24 DIAGNOSIS — D860 Sarcoidosis of lung: Secondary | ICD-10-CM | POA: Diagnosis not present

## 2014-09-24 DIAGNOSIS — M0589 Other rheumatoid arthritis with rheumatoid factor of multiple sites: Secondary | ICD-10-CM | POA: Diagnosis not present

## 2014-10-16 DIAGNOSIS — M1712 Unilateral primary osteoarthritis, left knee: Secondary | ICD-10-CM | POA: Diagnosis not present

## 2014-10-23 DIAGNOSIS — M1712 Unilateral primary osteoarthritis, left knee: Secondary | ICD-10-CM | POA: Diagnosis not present

## 2014-10-31 DIAGNOSIS — M1712 Unilateral primary osteoarthritis, left knee: Secondary | ICD-10-CM | POA: Diagnosis not present

## 2014-11-04 DIAGNOSIS — D860 Sarcoidosis of lung: Secondary | ICD-10-CM | POA: Diagnosis not present

## 2014-11-04 DIAGNOSIS — M150 Primary generalized (osteo)arthritis: Secondary | ICD-10-CM | POA: Diagnosis not present

## 2014-11-04 DIAGNOSIS — M0589 Other rheumatoid arthritis with rheumatoid factor of multiple sites: Secondary | ICD-10-CM | POA: Diagnosis not present

## 2014-11-04 DIAGNOSIS — M5136 Other intervertebral disc degeneration, lumbar region: Secondary | ICD-10-CM | POA: Diagnosis not present

## 2014-11-19 ENCOUNTER — Other Ambulatory Visit: Payer: Self-pay | Admitting: Internal Medicine

## 2014-11-19 ENCOUNTER — Other Ambulatory Visit: Payer: Self-pay | Admitting: Neurology

## 2014-11-19 NOTE — Telephone Encounter (Signed)
Rx signed and faxed.

## 2014-11-19 NOTE — Telephone Encounter (Signed)
Dr Dohmeier is out of the office, forwarding to MM for review since she has seen this patient previously.

## 2014-11-20 ENCOUNTER — Other Ambulatory Visit: Payer: Self-pay | Admitting: Emergency Medicine

## 2014-11-20 MED ORDER — GABAPENTIN 300 MG PO CAPS: ORAL_CAPSULE | ORAL | Status: DC

## 2014-11-20 NOTE — Telephone Encounter (Signed)
Please advise, last OV 2/16 

## 2014-11-20 NOTE — Telephone Encounter (Signed)
OK x 3 mos  My retirement date is 05/17/2015; but I will be in office only T, Weds & Thurs during the months Oct-Dec.You should transition your care to another PCP by Oct 1,2016.

## 2014-12-18 ENCOUNTER — Other Ambulatory Visit: Payer: Self-pay | Admitting: Internal Medicine

## 2014-12-18 ENCOUNTER — Other Ambulatory Visit: Payer: Self-pay

## 2014-12-18 MED ORDER — LEVOTHYROXINE SODIUM 150 MCG PO TABS: ORAL_TABLET | ORAL | Status: DC

## 2014-12-24 ENCOUNTER — Other Ambulatory Visit: Payer: Self-pay

## 2014-12-24 MED ORDER — LOSARTAN POTASSIUM 100 MG PO TABS: 100.0000 mg | ORAL_TABLET | Freq: Every day | ORAL | Status: DC

## 2014-12-24 MED ORDER — RANITIDINE HCL 150 MG PO TABS: 150.0000 mg | ORAL_TABLET | Freq: Two times a day (BID) | ORAL | Status: DC

## 2014-12-24 MED ORDER — CARVEDILOL 25 MG PO TABS: 25.0000 mg | ORAL_TABLET | Freq: Two times a day (BID) | ORAL | Status: DC

## 2015-01-08 DIAGNOSIS — L818 Other specified disorders of pigmentation: Secondary | ICD-10-CM | POA: Diagnosis not present

## 2015-01-09 DIAGNOSIS — M1712 Unilateral primary osteoarthritis, left knee: Secondary | ICD-10-CM | POA: Diagnosis not present

## 2015-01-13 ENCOUNTER — Other Ambulatory Visit (INDEPENDENT_AMBULATORY_CARE_PROVIDER_SITE_OTHER): Payer: Medicare Other

## 2015-01-13 ENCOUNTER — Encounter: Payer: Self-pay | Admitting: Internal Medicine

## 2015-01-13 ENCOUNTER — Ambulatory Visit (INDEPENDENT_AMBULATORY_CARE_PROVIDER_SITE_OTHER): Payer: Medicare Other | Admitting: Internal Medicine

## 2015-01-13 VITALS — BP 132/64 | HR 83 | Temp 98.2°F | Resp 18 | Ht 64.0 in | Wt 190.0 lb

## 2015-01-13 DIAGNOSIS — E039 Hypothyroidism, unspecified: Secondary | ICD-10-CM

## 2015-01-13 DIAGNOSIS — I10 Essential (primary) hypertension: Secondary | ICD-10-CM | POA: Diagnosis not present

## 2015-01-13 DIAGNOSIS — Z23 Encounter for immunization: Secondary | ICD-10-CM

## 2015-01-13 DIAGNOSIS — E785 Hyperlipidemia, unspecified: Secondary | ICD-10-CM | POA: Insufficient documentation

## 2015-01-13 DIAGNOSIS — R739 Hyperglycemia, unspecified: Secondary | ICD-10-CM | POA: Insufficient documentation

## 2015-01-13 LAB — LIPID PANEL
Cholesterol: 249 mg/dL — ABNORMAL HIGH (ref 0–200)
HDL: 65.1 mg/dL (ref >39.00)
LDL Cholesterol: 162 mg/dL — ABNORMAL HIGH (ref 0–99)
NonHDL: 183.97
Total CHOL/HDL Ratio: 4
Triglycerides: 108 mg/dL (ref 0.0–149.0)
VLDL: 21.6 mg/dL (ref 0.0–40.0)

## 2015-01-13 LAB — TSH: TSH: 2.13 u[IU]/mL (ref 0.35–4.50)

## 2015-01-13 LAB — HEMOGLOBIN A1C: Hgb A1c MFr Bld: 4.9 % (ref 4.6–6.5)

## 2015-01-13 NOTE — Assessment & Plan Note (Signed)
A1c

## 2015-01-13 NOTE — Assessment & Plan Note (Signed)
Lipids, TSH  LFTs by Dr Amil Amen

## 2015-01-13 NOTE — Progress Notes (Signed)
   Subjective:    Patient ID: Tina Molina, female    DOB: 03-Jun-1945, 69 y.o.   MRN: [id number]  HPI The patient is here to assess status of active health conditions.  PMH, FH, & Social History reviewed & updated.No change in [place] as recorded.  She is not on a heart healthy diet as she ingests red meat, fried foods, and sodium. She is not exercising. Blood pressure at home is in the 140s over 70s. She has been compliant with her medications.  She does experience some exertional dyspnea.  She's had minor vitiligo over the chin after she took the agent Remicade. She states her Rheumatologist question whether this might be sarcoid. She is now on a new biologic Leflunomide  She had a colonoscopy in 2010 and endoscopy in July 2013 which revealed chronic inactive gastritis. She has no active GI symptoms at this time.    Review of Systems  Chest pain, palpitations, tachycardia, paroxysmal nocturnal dyspnea, claudication or edema are absent. No unexplained weight loss, abdominal pain, significant dyspepsia, dysphagia, melena, rectal bleeding, or persistently small caliber stools. Dysuria, pyuria, hematuria, frequency, nocturia or polyuria are denied. Change in skin or nails denied. No bowel changes of constipation or diarrhea. No intolerance to heat or cold.    Objective:   Physical Exam  Pertinent or positive findings include: There is marked decreased range of motion the cervical spine. She has very faint irregular the lie go over the lower face. She has a grade 1/2-systolic murmur. There is a left carotid bruit versus radiation of the murmur. The; lysis of the right thumb. She has crepitus of the knees. Pedal pulses are decreased reticular the posterior tibial pulses. General appearance :adequately nourished; in no distress.  Eyes: No conjunctival inflammation or scleral icterus is present.  Oral exam:  Lips and gums are healthy appearing.There is no oropharyngeal erythema or exudate  noted. Dental hygiene is good.  Heart:  Normal rate and regular rhythm. S1 and S2 normal without gallop, murmur, click, rub or other extra sounds    Lungs:Chest clear to auscultation; no wheezes, rhonchi,rales ,or rubs present.No increased work of breathing.   Abdomen: bowel sounds normal, soft and non-tender without masses, organomegaly or hernias noted.  No guarding or rebound. No flank tenderness to percussion.  Vascular : all pulses equal ; no bruits present.  Skin:Warm & dry.  Intact without suspicious lesions or rashes ; no tenting or jaundice   Lymphatic: No lymphadenopathy is noted about the head, neck, axilla, or inguinal areas.   Neuro: Strength, tone & DTRs normal.       Assessment & Plan:  See Current Assessment & Plan in Problem List under specific Diagnosis

## 2015-01-13 NOTE — Progress Notes (Signed)
Pre visit review using our clinic review tool, if applicable. No additional management support is needed unless otherwise documented below in the visit note. 

## 2015-01-13 NOTE — Assessment & Plan Note (Signed)
TSH 

## 2015-01-13 NOTE — Patient Instructions (Addendum)
  Your next office appointment will be determined based upon review of your pending labs . Those written interpretation of the lab results and instructions will be transmitted to you by mail for your records.  Critical results will be called.   Followup as needed for any active or acute issue. Please report any significant change in your symptoms.  Minimal Blood Pressure Goal= AVERAGE < 140/90;  Ideal is an AVERAGE < 135/85. This AVERAGE should be calculated from @ least 5-7 BP readings taken @ different times of day on different days of week. You should not respond to isolated BP readings , but rather the AVERAGE for that week .Please bring your  blood pressure cuff to office visits to verify that it is reliable.It  can also be checked against the blood pressure device at the pharmacy. Finger or wrist cuffs are not dependable; an arm cuff is.

## 2015-01-13 NOTE — Assessment & Plan Note (Signed)
BMET by Dr Amil Amen  BP goals discussed

## 2015-01-22 ENCOUNTER — Other Ambulatory Visit: Payer: Self-pay | Admitting: Emergency Medicine

## 2015-01-22 MED ORDER — LEVOTHYROXINE SODIUM 150 MCG PO TABS: ORAL_TABLET | ORAL | Status: DC

## 2015-01-23 ENCOUNTER — Telehealth: Payer: Self-pay | Admitting: Emergency Medicine

## 2015-01-23 DIAGNOSIS — M5415 Radiculopathy, thoracolumbar region: Secondary | ICD-10-CM | POA: Diagnosis not present

## 2015-01-23 NOTE — Telephone Encounter (Signed)
Refill request for Flexeril. Please advise. Last OV 01/13/15

## 2015-01-23 NOTE — Telephone Encounter (Signed)
This medication is among those which experts have documented to have a very  high risk of affecting  mental  alertness  & balance. This results in increased risk of falling with serious health or life threatening injury. Such medication should be taken as infrequently as possible and @  the lowest possible dose.It should not be taken with alcohol, sedatives   or other agents which have a similar  adverse risk potential such as Ambien and Valium (Diazepam). These risks are greater as we age as there is decreased ability of the liver and kidneys to metabolize and excrete the medication, resulting in   increased blood levels of the active ingredient. Here back specialist would need to Rx this as needed

## 2015-01-27 NOTE — Telephone Encounter (Signed)
Pt stated that she took the RX to the Pharm from her back specialist and it shouldn't have been sent to [place]. She did receive the refill.

## 2015-01-27 NOTE — Telephone Encounter (Signed)
Pt returned your call.  

## 2015-01-27 NOTE — Telephone Encounter (Signed)
LVM for pt to call back to inform of Dr [employer] [facility] message

## 2015-02-03 DIAGNOSIS — M0589 Other rheumatoid arthritis with rheumatoid factor of multiple sites: Secondary | ICD-10-CM | POA: Diagnosis not present

## 2015-02-03 DIAGNOSIS — N183 Chronic kidney disease, stage 3 (moderate): Secondary | ICD-10-CM | POA: Diagnosis not present

## 2015-02-03 DIAGNOSIS — M5136 Other intervertebral disc degeneration, lumbar region: Secondary | ICD-10-CM | POA: Diagnosis not present

## 2015-02-03 DIAGNOSIS — D860 Sarcoidosis of lung: Secondary | ICD-10-CM | POA: Diagnosis not present

## 2015-02-03 DIAGNOSIS — M150 Primary generalized (osteo)arthritis: Secondary | ICD-10-CM | POA: Diagnosis not present

## 2015-02-24 DIAGNOSIS — H25041 Posterior subcapsular polar age-related cataract, right eye: Secondary | ICD-10-CM | POA: Diagnosis not present

## 2015-02-24 DIAGNOSIS — H5702 Anisocoria: Secondary | ICD-10-CM | POA: Diagnosis not present

## 2015-02-24 DIAGNOSIS — H25013 Cortical age-related cataract, bilateral: Secondary | ICD-10-CM | POA: Diagnosis not present

## 2015-02-24 DIAGNOSIS — H40013 Open angle with borderline findings, low risk, bilateral: Secondary | ICD-10-CM | POA: Diagnosis not present

## 2015-02-25 ENCOUNTER — Telehealth: Payer: Self-pay | Admitting: Internal Medicine

## 2015-02-25 DIAGNOSIS — I10 Essential (primary) hypertension: Secondary | ICD-10-CM

## 2015-02-25 MED ORDER — HYDRALAZINE HCL 50 MG PO TABS: 50.0000 mg | ORAL_TABLET | Freq: Three times a day (TID) | ORAL | Status: DC

## 2015-02-25 MED ORDER — GABAPENTIN 300 MG PO CAPS: ORAL_CAPSULE | ORAL | Status: DC

## 2015-02-25 NOTE — Telephone Encounter (Signed)
Pt called requesting refills for hydrALAZINE (APRESOLINE) 50 MG tablet [id number]  And gabapentin (NEURONTIN) 300 MG capsule [id number]   Pharmacy is [facility] on [address]

## 2015-02-25 NOTE — Telephone Encounter (Signed)
Notified pt refills sent to [facility]...Tina Molina

## 2015-03-13 ENCOUNTER — Ambulatory Visit (INDEPENDENT_AMBULATORY_CARE_PROVIDER_SITE_OTHER): Payer: Medicare Other

## 2015-03-13 DIAGNOSIS — Z23 Encounter for immunization: Secondary | ICD-10-CM | POA: Diagnosis not present

## 2015-03-24 ENCOUNTER — Other Ambulatory Visit: Payer: Self-pay | Admitting: Adult Health

## 2015-03-25 ENCOUNTER — Other Ambulatory Visit: Payer: Self-pay

## 2015-03-25 MED ORDER — ZOLPIDEM TARTRATE 10 MG PO TABS: ORAL_TABLET | ORAL | Status: DC

## 2015-03-25 NOTE — Telephone Encounter (Signed)
Rx signed and faxed.

## 2015-04-22 DIAGNOSIS — H2512 Age-related nuclear cataract, left eye: Secondary | ICD-10-CM | POA: Diagnosis not present

## 2015-04-22 DIAGNOSIS — H2511 Age-related nuclear cataract, right eye: Secondary | ICD-10-CM | POA: Diagnosis not present

## 2015-04-22 DIAGNOSIS — H18411 Arcus senilis, right eye: Secondary | ICD-10-CM | POA: Diagnosis not present

## 2015-04-22 DIAGNOSIS — H25012 Cortical age-related cataract, left eye: Secondary | ICD-10-CM | POA: Diagnosis not present

## 2015-05-05 DIAGNOSIS — N183 Chronic kidney disease, stage 3 (moderate): Secondary | ICD-10-CM | POA: Diagnosis not present

## 2015-05-05 DIAGNOSIS — M5136 Other intervertebral disc degeneration, lumbar region: Secondary | ICD-10-CM | POA: Diagnosis not present

## 2015-05-05 DIAGNOSIS — M0589 Other rheumatoid arthritis with rheumatoid factor of multiple sites: Secondary | ICD-10-CM | POA: Diagnosis not present

## 2015-05-05 DIAGNOSIS — M150 Primary generalized (osteo)arthritis: Secondary | ICD-10-CM | POA: Diagnosis not present

## 2015-05-05 DIAGNOSIS — D860 Sarcoidosis of lung: Secondary | ICD-10-CM | POA: Diagnosis not present

## 2015-05-15 ENCOUNTER — Encounter: Payer: Self-pay | Admitting: *Deleted

## 2015-05-18 HISTORY — PX: CATARACT EXTRACTION: SUR2

## 2015-05-21 ENCOUNTER — Other Ambulatory Visit: Payer: Self-pay | Admitting: Internal Medicine

## 2015-05-21 NOTE — Telephone Encounter (Signed)
perscription request came in from [facility] for Gabapentin and pt will need to schedule transfer appt before it can be filled

## 2015-05-23 MED ORDER — GABAPENTIN 300 MG PO CAPS: ORAL_CAPSULE | ORAL | Status: DC

## 2015-05-23 NOTE — Telephone Encounter (Signed)
pls advise is refill is okay.

## 2015-05-23 NOTE — Telephone Encounter (Signed)
Pt has an appt set up with Dr. Harrold Donath 06/25/15 (frist available). Please refill her med until her appt?

## 2015-05-23 NOTE — Telephone Encounter (Signed)
Ok to refill - rx sent

## 2015-06-01 ENCOUNTER — Other Ambulatory Visit: Payer: Self-pay | Admitting: Neurology

## 2015-06-03 ENCOUNTER — Other Ambulatory Visit: Payer: Self-pay

## 2015-06-03 MED ORDER — ZOLPIDEM TARTRATE 10 MG PO TABS: ORAL_TABLET | ORAL | Status: DC

## 2015-06-09 DIAGNOSIS — H2511 Age-related nuclear cataract, right eye: Secondary | ICD-10-CM | POA: Diagnosis not present

## 2015-06-09 DIAGNOSIS — H21561 Pupillary abnormality, right eye: Secondary | ICD-10-CM | POA: Diagnosis not present

## 2015-06-09 DIAGNOSIS — H25811 Combined forms of age-related cataract, right eye: Secondary | ICD-10-CM | POA: Diagnosis not present

## 2015-06-09 DIAGNOSIS — H25013 Cortical age-related cataract, bilateral: Secondary | ICD-10-CM | POA: Diagnosis not present

## 2015-06-09 DIAGNOSIS — H21541 Posterior synechiae (iris), right eye: Secondary | ICD-10-CM | POA: Diagnosis not present

## 2015-06-09 DIAGNOSIS — H25041 Posterior subcapsular polar age-related cataract, right eye: Secondary | ICD-10-CM | POA: Diagnosis not present

## 2015-06-10 DIAGNOSIS — H2512 Age-related nuclear cataract, left eye: Secondary | ICD-10-CM | POA: Diagnosis not present

## 2015-06-19 ENCOUNTER — Other Ambulatory Visit: Payer: Self-pay | Admitting: Emergency Medicine

## 2015-06-19 MED ORDER — LOSARTAN POTASSIUM 100 MG PO TABS: 100.0000 mg | ORAL_TABLET | Freq: Every day | ORAL | Status: DC

## 2015-06-19 MED ORDER — CARVEDILOL 25 MG PO TABS: 25.0000 mg | ORAL_TABLET | Freq: Two times a day (BID) | ORAL | Status: DC

## 2015-06-19 MED ORDER — RANITIDINE HCL 150 MG PO TABS: 150.0000 mg | ORAL_TABLET | Freq: Two times a day (BID) | ORAL | Status: DC

## 2015-06-25 ENCOUNTER — Encounter: Payer: Self-pay | Admitting: Internal Medicine

## 2015-06-25 ENCOUNTER — Ambulatory Visit (INDEPENDENT_AMBULATORY_CARE_PROVIDER_SITE_OTHER): Payer: Medicare Other | Admitting: Internal Medicine

## 2015-06-25 VITALS — BP 130/64 | HR 95 | Temp 98.4°F | Resp 18 | Wt 194.0 lb

## 2015-06-25 DIAGNOSIS — K219 Gastro-esophageal reflux disease without esophagitis: Secondary | ICD-10-CM | POA: Diagnosis not present

## 2015-06-25 DIAGNOSIS — R768 Other specified abnormal immunological findings in serum: Secondary | ICD-10-CM

## 2015-06-25 DIAGNOSIS — E039 Hypothyroidism, unspecified: Secondary | ICD-10-CM

## 2015-06-25 DIAGNOSIS — I10 Essential (primary) hypertension: Secondary | ICD-10-CM

## 2015-06-25 MED ORDER — HYDRALAZINE HCL 50 MG PO TABS: 50.0000 mg | ORAL_TABLET | Freq: Three times a day (TID) | ORAL | Status: DC

## 2015-06-25 MED ORDER — BUDESONIDE-FORMOTEROL FUMARATE 160-4.5 MCG/ACT IN AERO: 2.0000 | INHALATION_SPRAY | RESPIRATORY_TRACT | Status: DC | PRN

## 2015-06-25 MED ORDER — SPIRONOLACTONE 50 MG PO TABS: 25.0000 mg | ORAL_TABLET | Freq: Every day | ORAL | Status: DC

## 2015-06-25 MED ORDER — LEVOTHYROXINE SODIUM 150 MCG PO TABS: ORAL_TABLET | ORAL | Status: DC

## 2015-06-25 NOTE — Patient Instructions (Signed)
   Medications reviewed and updated.  No changes recommended at this time.  Your prescription(s) have been submitted to your pharmacy. Please take as directed and contact our office if you believe you are having problem(s) with the medication(s).  Please schedule followup in 6 months

## 2015-06-25 NOTE — Progress Notes (Signed)
Subjective:    Patient ID: Tina Molina, female    DOB: [birth year], 70 y.o.   MRN: [id number]  HPI She is here to establish with a new pcp.  She is here for follow up.    Hypertension: She is taking her medication daily. She is compliant with a low sodium diet.  She denies chest pain, palpitations, shortness of breath and regular headaches. She has some mild leg edema.  She is not exercising regularly.     Hypothyroidism:  She is taking her medication daily.  She denies any recent changes in energy or weight that are unexplained.   RA:  She is following with rheumatology.  She is taking [place] daily.  He does have joint pain and swelling.  Back and neck pain;  She has had back surgery and has herniated disc in her neck.  She has neck and back pain and this limits her exercise.    Insomnia;  She is taking [place], which is prescribed by Dr. Brett Fairy.    GERD:  She is taking her medication daily as prescribed.  She denies any GERD symptoms and feels her GERD is well controlled.   Medications and allergies reviewed with patient and updated if appropriate.  Patient Active Problem List   Diagnosis Date Noted  . Hyperlipidemia 01/13/2015  . Hyperglycemia 01/13/2015  . Circadian rhythm sleep disorder 08/14/2014  . Rheumatoid factor positive 02/20/2014  . Renal insufficiency 11/03/2013  . Thoracic radiculopathy 10/30/2013  . Other and unspecified hyperlipidemia 08/20/2013  . Other abnormal glucose 12/15/2012  . Circadian rhythm sleep disorder, shift work type 11/03/2012  . Hematuria 08/05/2012  . MGUS (monoclonal gammopathy of unknown significance) 08/03/2012  . Hypotension 03/04/2012  . Acute respiratory failure ([place]) 03/04/2012  . Lumbar stenosis with neurogenic claudication 02/29/2012  . Abnormal CT scan 10/15/2011  . Edema 09/13/2011  . Abnormal complete blood count 05/27/2011  . WEIGHT GAIN 01/20/2010  . HERNIATED LUMBAR DISC 12/19/2009  . PULMONARY SARCOIDOSIS 10/16/2009    . Hypothyroidism 10/16/2009  . ANEMIA-NOS 10/16/2009  . CARPAL TUNNEL SYNDROME, BILATERAL 10/16/2009  . UVEITIS 10/16/2009  . Essential hypertension 10/16/2009  . ARTHRITIS 10/16/2009  . CERVICAL DISC DISORDER 10/16/2009    Current Outpatient Prescriptions on File Prior to Visit  Medication Sig Dispense Refill  . aspirin 81 MG tablet Take 81 mg by mouth daily.     . budesonide-formoterol (SYMBICORT) 160-4.5 MCG/ACT inhaler Inhale 2 puffs into the lungs as needed. For wheezing    . carvedilol (COREG) 25 MG tablet Take 1 tablet (25 mg total) by mouth 2 (two) times daily. 180 tablet 0  . cyclobenzaprine (FLEXERIL) 10 MG tablet TAKE 1/2 TO 1 TABLET BY MOUTH AT BEDTIME IF NEEDED DO NOT TAKE WITH AMBIEN OR OTHER SLEEP AID 10 tablet 0  . diazepam (VALIUM) 5 MG tablet take 1/2 to 1 tablet by mouth every 12 hours 60 tablet 0  . gabapentin (NEURONTIN) 300 MG capsule take 1 capsule by mouth every 8 hours if needed 90 capsule 0  . hydrALAZINE (APRESOLINE) 50 MG tablet Take 1 tablet (50 mg total) by mouth 3 (three) times daily. 90 tablet 5  . leflunomide (ARAVA) 20 MG tablet Take 20 mg by mouth daily.    Marland Kitchen levothyroxine (SYNTHROID, LEVOTHROID) 150 MCG tablet take 1 tablet by mouth once daily EXCEPT 1/2 TABLET ON WEDNESDAYS 45 tablet 5  . losartan (COZAAR) 100 MG tablet Take 1 tablet (100 mg total) by mouth daily. 90 tablet 0  .  Multiple Vitamin (MULTIVITAMIN WITH MINERALS) TABS Take 1 tablet by mouth daily.    . ranitidine (ZANTAC) 150 MG tablet Take 1 tablet (150 mg total) by mouth 2 (two) times daily. 180 tablet 0  . spironolactone (ALDACTONE) 50 MG tablet take 1/2 tablet by mouth once daily 30 tablet 5  . traMADol (ULTRAM) 50 MG tablet Take 50 mg by mouth every 6 (six) hours as needed.    . vitamin E 400 UNIT capsule Take 400 Units by mouth daily.     Marland Kitchen zolpidem (AMBIEN) 10 MG tablet take 1 tablet by mouth at bedtime if needed for sleep 30 tablet 1  . [DISCONTINUED] modafinil (PROVIGIL) 100 MG  tablet Take 100 mg by mouth daily.    . [DISCONTINUED] potassium chloride (MICRO-K) 10 MEQ CR capsule Take 1 capsule (10 mEq total) by mouth 2 (two) times daily. 180 capsule 1   No current facility-administered medications on file prior to visit.    Past Medical History  Diagnosis Date  . Pneumonia 04/2010    OP  . Anemia     Associated with leukopenia and lymphocytosis. This is been evaluated by Dr. Marin Olp . Her platelet count has been normal  . Hypertension   . Thyroid disease   . Chronic back pain   . Sarcoidosis   . Lymphocytosis   . GERD (gastroesophageal reflux disease)   . Chronic kidney disease   . Renal insufficiency   . Arthritis   . MGUS (monoclonal gammopathy of unknown significance) 08/03/2012    IgA 792 11/04/11    Past Surgical History  Procedure Laterality Date  . Cholecystectomy    . Carpal tunnel release      Bilateral   . Cervical discectomy  06/2010    Dr.Pool  . Thyroidectomy  2003    For goiter  . Colonoscopy  2010  . Posterior lumbar fusion  02/29/2012    Dr Alyson Locket op respiratory & renal compromise    Social History   Social History  . Marital Status: Legally Separated    Spouse Name: N/A  . Number of Children: 0  . Years of Education: 12   Occupational History  . Retired    Social History Main Topics  . Smoking status: Former Smoker    Types: Cigarettes    Quit date: 05/17/1997  . Smokeless tobacco: Never Used  . Alcohol Use: 0.6 oz/week    1 Glasses of wine per week     Comment: occasionally   . Drug Use: No  . Sexual Activity: Not on file   Other Topics Concern  . Not on file   Social History Narrative   Patient is divorced and lives with her sister. Patient is retired.   Caffeine- sometimes - one cup   Right handed.    Family History  Problem Relation Age of Onset  . Anemia Father   . Arthritis Father   . Heart failure Father 32    No CAD  . Diabetes Mother   . Arthritis Mother   . Glaucoma Mother   . Heart  disease Mother 1    No CAD  . Hypertension Brother   . Anemia Brother   . Kidney disease Brother   . Hypertension Sister   . Kidney disease Sister   . Anemia Sister   . Diabetes Maternal Aunt   . Arthritis Paternal Grandmother     Review of Systems  Constitutional: Negative for fever.  HENT: Positive for congestion and postnasal drip.  Respiratory: Positive for wheezing (intermittent x 1-2 weeks). Negative for cough and shortness of breath.   Cardiovascular: Negative for chest pain, palpitations and leg swelling.  Gastrointestinal: Negative for abdominal pain.       Occ GERD  Musculoskeletal: Positive for back pain, joint swelling and neck pain.  Neurological: Positive for numbness (b/l toes). Negative for dizziness, light-headedness and headaches.       Objective:   Filed Vitals:   06/25/15 1357  BP: 130/64  Pulse: 95  Temp: 98.4 F (36.9 C)  Resp: 18   Filed Weights   06/25/15 1357  Weight: 194 lb (87.998 kg)   Body mass index is 33.28 kg/(m^2).   Physical Exam Constitutional: Appears well-developed and well-nourished. No distress.  Neck: Neck supple. No tracheal deviation present. No thyromegaly present.  No carotid bruit. No cervical adenopathy.   Cardiovascular: Normal rate, regular rhythm and normal heart sounds.   No murmur heard.  Trace b/l edema Pulmonary/Chest: Effort normal and breath sounds normal. No respiratory distress. No wheezes.      Assessment & Plan:   See Problem List for Assessment and Plan of chronic medical problems.  F/u in 6 months - wellness with Manuela Schwartz and follow up with me.

## 2015-06-25 NOTE — Assessment & Plan Note (Signed)
GERD controlled  Continue zantac

## 2015-06-25 NOTE — Assessment & Plan Note (Signed)
Follows with rheum On [place]

## 2015-06-25 NOTE — Assessment & Plan Note (Signed)
BP controlled Continue current medication  

## 2015-06-25 NOTE — Assessment & Plan Note (Signed)
tsh has been in the normal range - continue current medication  Will recheck tsh in 6 months

## 2015-06-25 NOTE — Progress Notes (Signed)
Pre visit review using our clinic review tool, if applicable. No additional management support is needed unless otherwise documented below in the visit note. 

## 2015-06-30 DIAGNOSIS — H25812 Combined forms of age-related cataract, left eye: Secondary | ICD-10-CM | POA: Diagnosis not present

## 2015-06-30 DIAGNOSIS — H2512 Age-related nuclear cataract, left eye: Secondary | ICD-10-CM | POA: Diagnosis not present

## 2015-06-30 DIAGNOSIS — H25013 Cortical age-related cataract, bilateral: Secondary | ICD-10-CM | POA: Diagnosis not present

## 2015-07-24 DIAGNOSIS — Z6838 Body mass index (BMI) 38.0-38.9, adult: Secondary | ICD-10-CM | POA: Diagnosis not present

## 2015-07-24 DIAGNOSIS — M5415 Radiculopathy, thoracolumbar region: Secondary | ICD-10-CM | POA: Diagnosis not present

## 2015-08-06 DIAGNOSIS — M5136 Other intervertebral disc degeneration, lumbar region: Secondary | ICD-10-CM | POA: Diagnosis not present

## 2015-08-06 DIAGNOSIS — D860 Sarcoidosis of lung: Secondary | ICD-10-CM | POA: Diagnosis not present

## 2015-08-06 DIAGNOSIS — M150 Primary generalized (osteo)arthritis: Secondary | ICD-10-CM | POA: Diagnosis not present

## 2015-08-06 DIAGNOSIS — M7989 Other specified soft tissue disorders: Secondary | ICD-10-CM | POA: Diagnosis not present

## 2015-08-06 DIAGNOSIS — N183 Chronic kidney disease, stage 3 (moderate): Secondary | ICD-10-CM | POA: Diagnosis not present

## 2015-08-06 DIAGNOSIS — M0589 Other rheumatoid arthritis with rheumatoid factor of multiple sites: Secondary | ICD-10-CM | POA: Diagnosis not present

## 2015-08-11 ENCOUNTER — Other Ambulatory Visit: Payer: Self-pay | Admitting: Neurology

## 2015-08-12 ENCOUNTER — Ambulatory Visit (INDEPENDENT_AMBULATORY_CARE_PROVIDER_SITE_OTHER): Payer: Medicare Other | Admitting: Neurology

## 2015-08-12 ENCOUNTER — Encounter: Payer: Self-pay | Admitting: Neurology

## 2015-08-12 ENCOUNTER — Encounter: Payer: Self-pay | Admitting: *Deleted

## 2015-08-12 VITALS — BP 150/68 | HR 88 | Resp 20 | Ht 64.0 in | Wt 191.0 lb

## 2015-08-12 DIAGNOSIS — G4700 Insomnia, unspecified: Secondary | ICD-10-CM | POA: Diagnosis not present

## 2015-08-12 DIAGNOSIS — G4720 Circadian rhythm sleep disorder, unspecified type: Secondary | ICD-10-CM | POA: Diagnosis not present

## 2015-08-12 DIAGNOSIS — F5104 Psychophysiologic insomnia: Secondary | ICD-10-CM

## 2015-08-12 DIAGNOSIS — Z73819 Behavioral insomnia of childhood, unspecified type: Secondary | ICD-10-CM

## 2015-08-12 MED ORDER — MELATONIN 3 MG PO TABS: 3.0000 mg | ORAL_TABLET | Freq: Every day | ORAL | Status: DC

## 2015-08-12 MED ORDER — ZOLPIDEM TARTRATE 10 MG PO TABS: 10.0000 mg | ORAL_TABLET | Freq: Every evening | ORAL | Status: DC | PRN

## 2015-08-12 NOTE — Progress Notes (Signed)
[facility]   Provider:  Dr Dois Juarbe Referring Provider: Hendricks Limes, MD Primary Care Physician:  Binnie Rail, MD  Chief Complaint  Patient presents with  . Follow-up    insomnia still present    HPI:  Tina Molina is a 70 y.o. female here for follow up , Insomnia is her main  sleep problems since 2012 .    She has a history of sarcoidosis, chronic lymphocytosis, normocytic anemia and transient leukopenia.  MGUS followed by Dr Marin Olp.  The patient has hypertension for many years but had a spell of malignant hypertension in 2013, is now on 5 different antihypertensives, creatinine was 1.3 at the time.   allergic rhinitis, runny nose,  Chronic  insomnia and she has been using Ambien 10 mg with good success. She has rebound insomnia when not taking the medication she has also alternated with doxylamate , which is Unisom, and Doxepin. Tina Molina tests movements tells me that she still sleeping usually in the dental falling asleep in front of the running TV which of course is not optimal and the treatment of insomnia. I will give her today a booklet about insomnia that suggest some sleep habits to change she also has a circadian rhythm disorder we know that from her years of shift work. Often booklet with explanations regarding this as well. I will be refilling her Ambien today. She advised me that she has started the treatment of rheumatoid arthritis with Dr. Amil Amen and she is now taking Remicade infusions.  She developed some nods last week, after her fourth Iv treatment.. 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 She hasn't acquired circadian rhythm disorder and has also maintained an abnormal night and day rhythm,  since her sister who  Resides with her had been a night shift  worker ,too -  And stays  awake at night as well, watching TV together. She has used doxepin to help her sleep and was a that has also worked quite well for her,  she has used generic modafinil for the daytime(to stay awake) but had one episode of palpitation, it turned out this was on a day when she did not take modafinil.Since March 2013 she has increasing arthritis pain she used NSAIDS for  pain control , until  blood pressure started to rise  again. She reports that a kidney function evaluation and Doppler of the renal artery did not give evidence of a renal origin off her hypertension. She is now longer taking 3 blood pressure medications but 5. We are  meeting today to address the chronic insomnia and medication risks in review of her underlying condition of pulmonary sarcoidosis, obesity and hypertension, underwent a back surgery since her last visit with [place] on 02/15/2012 , and went into acute renal failure and respiratory failure,  and  Was finally discharged from the hospital in November  2013 she had to go through a long rehabilitation process . Last visit 2014 with Dr. Brett Fairy, patient has chronic insomnia related to her circadian rhythm disorder,  attributed to her shift work history of almost 3 decades. The hygiene behavior modifications have been discussed and partially implemented. The patient also is more related to lose weight but has been experiencing difficulties to exercise do to her a knee injury and  back pain. The patient's that failed to mirtazapine and Elavil and had nightmares on these working well for her our surgery milligrams of a generic pills of doxepin. None The patient is sarcoidosis pulmonary with dry eyes and CNS manifestations doxepin is worsening the dry eye component and dry mouth complement The patient is on multiple antihypertensives : fall risk is elevated at 7 point she is encouraged to do daily exercises with the pulmonary or cardial rehabilitation unit.      Review of Systems: Out of a complete 14 system review, the patient complains of only the following symptoms, and all other reviewed systems are negative.  insomnia , she had a back surgery was in 2013, and she feels she has returned to the same symptoms,. MRI scheduled for 08-15-14 . Back pain  Related to rheumatoid and osteo arthritis, the patient is fatigued, still endorses daytime sleepiness . 08-14-14  Epworth score  Is 5 , her fatigue severity score is 52 up form 43 points.    Social History   Social History  . Marital Status: Legally Separated    Spouse Name: N/A  . Number of Children: 0  . Years of Education: 12   Occupational History  . Retired    Social History Main Topics  . Smoking status: Former Smoker    Types: Cigarettes    Quit date: 05/17/1997  . Smokeless tobacco: Never Used  . Alcohol Use: 0.6 oz/week    1 Glasses of wine per week     Comment: occasionally   . Drug Use: No  . Sexual Activity: Not on file   Other Topics Concern  . Not on file   Social History Narrative   Patient is divorced and lives with her sister. Patient is retired.   Caffeine- sometimes - one cup   Right handed.    Family History  Problem Relation Age of Onset  . Anemia Father   . Arthritis Father   . Heart failure Father 24    No CAD  . Diabetes Mother   . Arthritis Mother   . Glaucoma Mother   . Heart disease Mother 16    No CAD  . Hypertension Brother   . Anemia Brother   . Kidney disease Brother   . Hypertension Sister   . Kidney disease Sister   . Anemia Sister   . Diabetes Maternal Aunt   . Arthritis Paternal Grandmother     Past Medical History  Diagnosis Date  . Pneumonia 04/2010    OP  . Anemia     Associated with leukopenia and lymphocytosis. This is been evaluated by Dr. Marin Olp . Her platelet count has been normal  . Hypertension   . Thyroid disease   . Chronic back pain   . Sarcoidosis ([place])   . Lymphocytosis   . GERD (gastroesophageal  reflux disease)   . Chronic kidney disease   . Renal insufficiency   . Arthritis   . MGUS (monoclonal gammopathy of unknown significance) 08/03/2012    IgA 792 11/04/11    Past Surgical History  Procedure Laterality Date  . Cholecystectomy    . Carpal tunnel release      Bilateral   . Cervical discectomy  06/2010    Dr.Pool  . Thyroidectomy  2003    For goiter  . Colonoscopy  2010  . Posterior lumbar fusion  02/29/2012    Dr Alyson Locket op respiratory & renal compromise  . Cataract extraction Right 05/2015    Current  Outpatient Prescriptions  Medication Sig Dispense Refill  . aspirin 81 MG tablet Take 81 mg by mouth daily.     . budesonide-formoterol (SYMBICORT) 160-4.5 MCG/ACT inhaler Inhale 2 puffs into the lungs as needed. For wheezing 1 Inhaler 5  . carvedilol (COREG) 25 MG tablet Take 1 tablet (25 mg total) by mouth 2 (two) times daily. 180 tablet 0  . cyclobenzaprine (FLEXERIL) 10 MG tablet TAKE 1/2 TO 1 TABLET BY MOUTH AT BEDTIME IF NEEDED DO NOT TAKE WITH AMBIEN OR OTHER SLEEP AID 10 tablet 0  . gabapentin (NEURONTIN) 300 MG capsule take 1 capsule by mouth every 8 hours if needed 90 capsule 0  . hydrALAZINE (APRESOLINE) 50 MG tablet Take 1 tablet (50 mg total) by mouth 3 (three) times daily. 270 tablet 3  . leflunomide (ARAVA) 20 MG tablet Take 20 mg by mouth daily.    Marland Kitchen levothyroxine (SYNTHROID, LEVOTHROID) 150 MCG tablet take 1 tablet by mouth once daily EXCEPT 1/2 TABLET ON WEDNESDAYS 90 tablet 3  . losartan (COZAAR) 100 MG tablet Take 1 tablet (100 mg total) by mouth daily. 90 tablet 0  . Multiple Vitamin (MULTIVITAMIN WITH MINERALS) TABS Take 1 tablet by mouth daily.    . ranitidine (ZANTAC) 150 MG tablet Take 1 tablet (150 mg total) by mouth 2 (two) times daily. 180 tablet 0  . spironolactone (ALDACTONE) 50 MG tablet Take 0.5 tablets (25 mg total) by mouth daily. 90 tablet 3  . traMADol (ULTRAM) 50 MG tablet Take 50 mg by mouth every 6 (six) hours as needed.    .  vitamin E 400 UNIT capsule Take 400 Units by mouth daily.     Marland Kitchen zolpidem (AMBIEN) 10 MG tablet take 1 tablet by mouth at bedtime if needed for sleep 30 tablet 1  . [DISCONTINUED] modafinil (PROVIGIL) 100 MG tablet Take 100 mg by mouth daily.    . [DISCONTINUED] potassium chloride (MICRO-K) 10 MEQ CR capsule Take 1 capsule (10 mEq total) by mouth 2 (two) times daily. 180 capsule 1   No current facility-administered medications for this visit.    Allergies as of 08/12/2015 - Review Complete 08/12/2015  Allergen Reaction Noted  . Penicillins    . Shellfish allergy Rash 08/11/2011  . Sulfonamide derivatives    . Azithromycin  06/27/2014  . Codeine  10/16/2009    Vitals: BP 150/68 mmHg  Pulse 88  Resp 20  Ht 5\' 4"  (1.626 m)  Wt 191 lb (86.637 kg)  BMI 32.77 kg/m2 Last Weight:  Wt Readings from Last 1 Encounters:  08/12/15 191 lb (86.637 kg)   Last Height:   Ht Readings from Last 1 Encounters:  08/12/15 5\' 4"  (1.626 m)    Physical exam:  General: The patient is awake, alert and appears not in acute distress. The patient is well groomed. Head: Normocephalic, atraumatic. Neck is supple. Mallampati3 neck circumference: 14.5 inches , no nasal deviation. No retrognathia.  Cardiovascular:  Regular rate and rhythm, without  murmurs or carotid bruit, and without distended neck veins. Respiratory: Lungs are clear to auscultation. Skin:  Without evidence of edema, or rash Trunk: BMI is elevated and patient  has normal posture. Neurologic exam : The patient is awake and alert, oriented to place and time.  Memory subjective  described as intact. There is a normal attention span & concentration ability. Speech is fluent without  dysarthria, dysphonia or aphasia. Mood and affect are appropriate. Cranial nerves: Pupils are equal and briskly reactive to light. Funduscopic exam  without  evidence of pallor or edema. Extraocular movements  in vertical and horizontal planes intact and without  nystagmus. Visual fields by finger perimetry are intact. Hearing to finger rub intact. Facial sensation intact to fine touch. Facial motor strength is symmetric and tongue and uvula move midline.   Motor exam:   Normal tone and normal muscle bulk and symmetric normal strength in upper extremities. Torn meniscus made ROM limited, left knee with crepitation.  Sensory:  Fine touch, pinprick and vibration were tested in all extremities,  Numbness in both feet, and left L4-5 s1 dermatome.  Proprioception is tested in the upper extremities only. This was normal. Coordination: Rapid alternating movements in the fingers/hands is normal, without evidence of ataxia, dysmetria or tremor. Gait and station: Patient walks without assistive device. Strength within normal limits. Stance is stable and normal. Tandem gait is slowed, deferred- unfragmented turns. Romberg testing is normal.   Assessment :  patient with chronic insomnia related to a long-standing shift work sleep disorder-circadian rhythm disorder. In addition high fatigue due to autoimmune disease-rheumatoid arthritis. The high degree of fatigue as reflected and the fatigue severity scale she is not excessively daytime sleepy anymore. I refilled her Ambien which she is taking chronically. I gave her information booklets for shift work disorder as well as for insomnia.  I asked her today about any advanced directives that she may have to establish. She stated that she has none at this time and place, but then she also told me that she is now him supposedly legally divorced but she has never seen the divorce- decree.(?)  I have refilled Tina Molina is Ambien she is using 10 mg each night for many years now I have had a long discussion about melatonin or doxepin to be used instead of a prescription medicine or to replace the Ambien 2-3 nights out of the week. This way the Ambien would work better and over a longer period of time. Please remember to try to  maintain good sleep hygiene, which means: Keep a regular sleep and wake schedule, try not to exercise or have a meal within 2 hours of your bedtime, try to keep your bedroom conducive for sleep, that is, cool and dark, without light distractors such as an illuminated alarm clock, and refrain from watching TV right before sleep or in the middle of the night and do not keep the TV or radio on during the night. Also, try not to use or play on electronic devices at bedtime, such as your cell phone, tablet PC or laptop. If you like to read at bedtime on an electronic device, try to dim the background light as much as possible. Do not eat in the middle of the night.    For chronic insomnia, you are best followed by a psychiatrist and/or sleep psychologist.   Tina Molina underwent a polysomnography study in [place] over a decade ago, she has not been reevaluated since but at that time did not have apnea. It would be beneficial to have a sleep study done to rule out organic reasons that may cause her chronic insomnia or at least contribute to it. Sleep hygiene instructions were given above and will be placed on the patient instructions.  Kito Cuffe, MD   Cc; Unice Cobble, MD  Celso Amy,

## 2015-08-12 NOTE — Patient Instructions (Signed)
Please remember to try to maintain good sleep hygiene, which means: Keep a regular sleep and wake schedule, try not to exercise or have a meal within 2 hours of your bedtime, try to keep your bedroom conducive for sleep, that is, cool and dark, without light distractors such as an illuminated alarm clock, and refrain from watching TV right before sleep or in the middle of the night and do not keep the TV or radio on during the night. Also, try not to use or play on electronic devices at bedtime, such as your cell phone, tablet PC or laptop. If you like to read at bedtime on an electronic device, try to dim the background light as much as possible. Do not eat in the middle of the night.   We will request a sleep study.    We will look for leg twitching and snoring or sleep apnea.   For chronic insomnia, you are best followed by a psychiatrist and/or sleep psychologist.   We will call you with the sleep study results and make a follow up appointment if needed.   Zolpidem tablets What is this medicine? ZOLPIDEM (zole PI dem) is used to treat insomnia. This medicine helps you to fall asleep and sleep through the night. This medicine may be used for other purposes; ask your health care provider or pharmacist if you have questions. What should I tell my health care provider before I take this medicine? They need to know if you have any of these conditions: -depression -history of drug abuse or addiction -if you often drink alcohol -liver disease -lung or breathing disease -myasthenia gravis -sleep apnea -suicidal thoughts, plans, or attempt; a previous suicide attempt by you or a family member -an unusual or allergic reaction to zolpidem, other medicines, foods, dyes, or preservatives -pregnant or trying to get pregnant -breast-feeding How should I use this medicine? Take this medicine by mouth with a glass of water. Follow the directions on the prescription label. It is better to take this  medicine on an empty stomach and only when you are ready for bed. Do not take your medicine more often than directed. If you have been taking this medicine for several weeks and suddenly stop taking it, you may get unpleasant withdrawal symptoms. Your doctor or health care professional may want to gradually reduce the dose. Do not stop taking this medicine on your own. Always follow your doctor or health care professional's advice. A special MedGuide will be given to you by the pharmacist with each prescription and refill. Be sure to read this information carefully each time. Talk to your pediatrician regarding the use of this medicine in children. Special care may be needed. Overdosage: If you think you have taken too much of this medicine contact a poison control center or emergency room at once. NOTE: This medicine is only for you. Do not share this medicine with others. What if I miss a dose? This does not apply. This medicine should only be taken immediately before going to sleep. Do not take double or extra doses. What may interact with this medicine? -alcohol -antihistamines for allergy, cough and cold -certain medicines for anxiety or sleep -certain medicines for depression, like amitriptyline, fluoxetine, sertraline -certain medicines for fungal infections like ketoconazole and itraconazole -certain medicines for seizures like phenobarbital, primidone -ciprofloxacin -dietary supplements for sleep, like valerian or kava kava -general anesthetics like halothane, isoflurane, methoxyflurane, propofol -local anesthetics like lidocaine, pramoxine, tetracaine -medicines that relax muscles for surgery -narcotic  medicines for pain -phenothiazines like chlorpromazine, mesoridazine, prochlorperazine, thioridazine -rifampin This list may not describe all possible interactions. Give your health care provider a list of all the medicines, herbs, non-prescription drugs, or dietary supplements you use.  Also tell them if you smoke, drink alcohol, or use illegal drugs. Some items may interact with your medicine. What should I watch for while using this medicine? Visit your doctor or health care professional for regular checks on your progress. Keep a regular sleep schedule by going to bed at about the same time each night. Avoid caffeine-containing drinks in the evening hours. When sleep medicines are used every night for more than a few weeks, they may stop working. Talk to your doctor if you still have trouble sleeping. After taking this medicine for sleep, you may get up out of bed while not being fully awake and do an activity that you do not know you are doing. The next morning, you may have no memory of the event. Activities such as driving a car ("sleep-driving"), making and eating food, talking on the phone, sexual activity, and sleep-walking have been reported. Call your doctor right away if you find out you have done any of these activities. Do not take this medicine if you have used alcohol that evening or before bed or taken another medicine for sleep since your risk of doing these sleep-related activities will be increased. Wait for at least 8 hours after you take a dose before driving or doing other activities that require full mental alertness. Do not take this medicine unless you are able to stay in bed for a full night (7 to 8 hours) before you must be active again. You may have a decrease in mental alertness the day after use, even if you feel that you are fully awake. Tell your doctor if you will need to perform activities requiring full alertness, such as driving, the next day. Do not stand or sit up quickly after taking this medicine, especially if you are an older patient. This reduces the risk of dizzy or fainting spells. If you or your family notice any changes in your behavior, such as new or worsening depression, thoughts of harming yourself, anxiety, other unusual or disturbing  thoughts, or memory loss, call your doctor right away. After you stop taking this medicine, you may have trouble falling asleep. This is called rebound insomnia. This problem usually goes away on its own after 1 or 2 nights. What side effects may I notice from receiving this medicine? Side effects that you should report to your doctor or health care professional as soon as possible: -allergic reactions like skin rash, itching or hives, swelling of the face, lips, or tongue -breathing problems -changes in vision -confusion -depressed mood or other changes in moods or emotions -feeling faint or lightheaded, falls -hallucinations -loss of balance or coordination -loss of memory -restlessness, excitability, or feelings of anxiety or agitation -suicidal thoughts -unusual activities while asleep like driving, eating, making phone calls, or sexual activity Side effects that usually do not require medical attention (report to your doctor or health care professional if they continue or are bothersome): -dizziness -drowsiness the day after you take this medicine -headache This list may not describe all possible side effects. Call your doctor for medical advice about side effects. You may report side effects to FDA at 1-800-FDA-1088. Where should I keep my medicine? Keep out of the reach of children. This medicine can be abused. Keep your medicine in a safe  place to protect it from theft. Do not share this medicine with anyone. Selling or giving away this medicine is dangerous and against the law. This medicine may cause accidental overdose and death if taken by other adults, children, or pets. Mix any unused medicine with a substance like cat litter or coffee grounds. Then throw the medicine away in a sealed container like a sealed bag or a coffee can with a lid. Do not use the medicine after the expiration date. Store at room temperature between 20 and 25 degrees C (68 and 77 degrees F). NOTE: This  sheet is a summary. It may not cover all possible information. If you have questions about this medicine, talk to your doctor, pharmacist, or health care provider.    2016, [employer]vier/Gold Standard. (2015-01-06 17:53:29)

## 2015-08-12 NOTE — Telephone Encounter (Signed)
RX for ambien faxed to [facility]. Received a receipt of confirmation.  

## 2015-08-26 DIAGNOSIS — H04123 Dry eye syndrome of bilateral lacrimal glands: Secondary | ICD-10-CM | POA: Diagnosis not present

## 2015-09-02 ENCOUNTER — Ambulatory Visit (INDEPENDENT_AMBULATORY_CARE_PROVIDER_SITE_OTHER): Payer: Medicare Other | Admitting: Neurology

## 2015-09-02 ENCOUNTER — Other Ambulatory Visit: Payer: Self-pay | Admitting: Emergency Medicine

## 2015-09-02 DIAGNOSIS — Z73819 Behavioral insomnia of childhood, unspecified type: Secondary | ICD-10-CM

## 2015-09-02 DIAGNOSIS — F5104 Psychophysiologic insomnia: Secondary | ICD-10-CM

## 2015-09-02 DIAGNOSIS — G4710 Hypersomnia, unspecified: Secondary | ICD-10-CM

## 2015-09-02 DIAGNOSIS — G4720 Circadian rhythm sleep disorder, unspecified type: Secondary | ICD-10-CM

## 2015-09-02 MED ORDER — GABAPENTIN 300 MG PO CAPS: ORAL_CAPSULE | ORAL | Status: DC

## 2015-09-02 NOTE — Telephone Encounter (Signed)
Please advise if okay to fill, last OV 06/25/15, last fill 05/23/15 #[id number]

## 2015-09-08 ENCOUNTER — Telehealth: Payer: Self-pay

## 2015-09-08 NOTE — Telephone Encounter (Signed)
I spoke to pt and advised her that her total sleep time was 73 minutes. Pt verbalized understanding.

## 2015-09-08 NOTE — Telephone Encounter (Signed)
Patient called, wants to know how many hours she actually slept during sleep study. Please call [phone].

## 2015-09-08 NOTE — Telephone Encounter (Signed)
I spoke to pt and advised her that her sleep study results did not reveal an organic reason for insomnia. I advised pt to avoid caffeine containing beverages and chocolate. I recommended a sleep psychology referral for chronic insomnia, but pt refused. A follow up appt was made with Dr. Brett Fairy on 10/22/15 at 1:30. Pt verbalized understanding.

## 2015-09-23 ENCOUNTER — Other Ambulatory Visit: Payer: Self-pay | Admitting: Emergency Medicine

## 2015-09-23 MED ORDER — LOSARTAN POTASSIUM 100 MG PO TABS: 100.0000 mg | ORAL_TABLET | Freq: Every day | ORAL | Status: DC

## 2015-09-23 MED ORDER — RANITIDINE HCL 150 MG PO TABS: 150.0000 mg | ORAL_TABLET | Freq: Two times a day (BID) | ORAL | Status: DC

## 2015-09-23 MED ORDER — CARVEDILOL 25 MG PO TABS: 25.0000 mg | ORAL_TABLET | Freq: Two times a day (BID) | ORAL | Status: DC

## 2015-10-22 ENCOUNTER — Ambulatory Visit (INDEPENDENT_AMBULATORY_CARE_PROVIDER_SITE_OTHER): Payer: Medicare Other | Admitting: Neurology

## 2015-10-22 ENCOUNTER — Encounter: Payer: Self-pay | Admitting: Neurology

## 2015-10-22 VITALS — BP 186/90 | HR 86 | Resp 20 | Ht 64.0 in | Wt 197.0 lb

## 2015-10-22 DIAGNOSIS — F5104 Psychophysiologic insomnia: Secondary | ICD-10-CM

## 2015-10-22 DIAGNOSIS — G4700 Insomnia, unspecified: Secondary | ICD-10-CM | POA: Diagnosis not present

## 2015-10-22 MED ORDER — ZOLPIDEM TARTRATE 10 MG PO TABS: 10.0000 mg | ORAL_TABLET | Freq: Every evening | ORAL | Status: DC | PRN

## 2015-10-22 NOTE — Patient Instructions (Signed)

## 2015-10-22 NOTE — Progress Notes (Signed)
[facility]   Provider:  Dr Kamar Callender Referring Provider: Binnie Rail, MD Primary Care Physician:  Binnie Rail, MD  Chief Complaint  Patient presents with  . Follow-up    discuss sleep study results, pt refused psychology referral for insomnia, BP 186/90    HPI:  KWAN BERLAND is a 70 y.o. female here for follow up , Insomnia is her main  sleep problems since 2012 .    She has a history of sarcoidosis, chronic lymphocytosis, normocytic anemia and transient leukopenia.  MGUS followed by Dr Marin Olp.  The patient has hypertension for many years but had a spell of malignant hypertension in 2013, is now on 5 different antihypertensives, creatinine was 1.3 at the time.   allergic rhinitis, runny nose,  Chronic  insomnia and she has been using Ambien 10 mg with good success. She has rebound insomnia when not taking the medication she has also alternated with doxylamate , which is Unisom, and Doxepin. Mrs. leipold tests movements tells me that she still sleeping usually in the dental falling asleep in front of the running TV which of course is not optimal and the treatment of insomnia. I will give her today a booklet about insomnia that suggest some sleep habits to change she also has a circadian rhythm disorder we know that from her years of shift work. Often booklet with explanations regarding this as well. I will be refilling her Ambien today. She advised me that she has started the treatment of rheumatoid arthritis with Dr. Amil Amen and she is now taking Remicade infusions.  She developed some nods last week, after her fourth Iv treatment.. 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 She hasn't acquired circadian rhythm disorder and has also maintained an abnormal night and day rhythm,  since  her sister  had been a night shift worker ,too -  And stays  awake at night as well,  The sisters are watching TV together.  Interval history from 10/22/2015. Mrs. bourdon is here today and is actually looking very well. She had a repeat sleep study on 09/02/2015 showing no apnea being present. The patient has a history of pulmonary sarcoidosis so another concern was that of possible hypoxemia but her desaturations overnight were minimal and she only had 2.5 minutes of total desaturation time. There was no organic reason for her insomnia being found and we resumed the diagnosis of former shift work disorder-circadian rhythm disorder. She is a chronic insomniac,  but her Epworth sleepiness score today is only endorsed at 4 points fatigue severity at 46 points and the geriatric depression score at 3 out of 15 points. She has been able to use Ambien 4 nights a week at the other nights Unisom and is doing well with this. Under the circumstances, I have no hesitation of providing the Ambien for her.    Review of Systems: Out of a complete 14 system review, the patient complains of only the following symptoms, and all other reviewed systems are negative. insomnia , she had a back surgery was in 2013, and she feels she has returned to the same symptoms,. MRI scheduled for 08-15-14 . Back pain related to rheumatoid and osteo arthritis, the patient is fatigued, still endorses daytime sleepiness .   Social History   Social History  .  Marital Status: Legally Separated    Spouse Name: N/A  . Number of Children: 0  . Years of Education: 12   Occupational History  . Retired    Social History Main Topics  . Smoking status: Former Smoker    Types: Cigarettes    Quit date: 05/17/1997  . Smokeless tobacco: Never Used  . Alcohol Use: 0.6 oz/week    1 Glasses of wine per week     Comment: occasionally   . Drug Use: No  . Sexual Activity: Not on file   Other Topics Concern  . Not on file   Social  History Narrative   Patient is divorced and lives with her sister. Patient is retired.   Caffeine- sometimes - one cup   Right handed.    Family History  Problem Relation Age of Onset  . Anemia Father   . Arthritis Father   . Heart failure Father 78    No CAD  . Diabetes Mother   . Arthritis Mother   . Glaucoma Mother   . Heart disease Mother 21    No CAD  . Hypertension Brother   . Anemia Brother   . Kidney disease Brother   . Hypertension Sister   . Kidney disease Sister   . Anemia Sister   . Diabetes Maternal Aunt   . Arthritis Paternal Grandmother     Past Medical History  Diagnosis Date  . Pneumonia 04/2010    OP  . Anemia     Associated with leukopenia and lymphocytosis. This is been evaluated by Dr. Marin Olp . Her platelet count has been normal  . Hypertension   . Thyroid disease   . Chronic back pain   . Sarcoidosis ([place])   . Lymphocytosis   . GERD (gastroesophageal reflux disease)   . Chronic kidney disease   . Renal insufficiency   . Arthritis   . MGUS (monoclonal gammopathy of unknown significance) 08/03/2012    IgA 792 11/04/11    Past Surgical History  Procedure Laterality Date  . Cholecystectomy    . Carpal tunnel release      Bilateral   . Cervical discectomy  06/2010    Dr.Pool  . Thyroidectomy  2003    For goiter  . Colonoscopy  2010  . Posterior lumbar fusion  02/29/2012    Dr Alyson Locket op respiratory & renal compromise  . Cataract extraction Right 05/2015    Current Outpatient Prescriptions  Medication Sig Dispense Refill  . aspirin 81 MG tablet Take 81 mg by mouth daily.     . budesonide-formoterol (SYMBICORT) 160-4.5 MCG/ACT inhaler Inhale 2 puffs into the lungs as needed. For wheezing 1 Inhaler 5  . carvedilol (COREG) 25 MG tablet Take 1 tablet (25 mg total) by mouth 2 (two) times daily. 180 tablet 2  . cyclobenzaprine (FLEXERIL) 10 MG tablet TAKE 1/2 TO 1 TABLET BY MOUTH AT BEDTIME IF NEEDED DO NOT TAKE WITH AMBIEN OR OTHER SLEEP  AID 10 tablet 0  . gabapentin (NEURONTIN) 300 MG capsule take 1 capsule by mouth every 8 hours if needed 90 capsule 3  . hydrALAZINE (APRESOLINE) 50 MG tablet Take 1 tablet (50 mg total) by mouth 3 (three) times daily. 270 tablet 3  . leflunomide (ARAVA) 20 MG tablet Take 20 mg by mouth daily.    Marland Kitchen levothyroxine (SYNTHROID, LEVOTHROID) 150 MCG tablet take 1 tablet by mouth once daily EXCEPT 1/2 TABLET ON WEDNESDAYS 90 tablet 3  . losartan (COZAAR) 100 MG tablet  Take 1 tablet (100 mg total) by mouth daily. 90 tablet 2  . Multiple Vitamin (MULTIVITAMIN WITH MINERALS) TABS Take 1 tablet by mouth daily.    . ranitidine (ZANTAC) 150 MG tablet Take 1 tablet (150 mg total) by mouth 2 (two) times daily. 180 tablet 2  . spironolactone (ALDACTONE) 50 MG tablet Take 0.5 tablets (25 mg total) by mouth daily. 90 tablet 3  . traMADol (ULTRAM) 50 MG tablet Take 50 mg by mouth every 6 (six) hours as needed.    . vitamin E 400 UNIT capsule Take 400 Units by mouth daily.     Marland Kitchen zolpidem (AMBIEN) 10 MG tablet Take 1 tablet (10 mg total) by mouth at bedtime as needed for sleep. 90 tablet 3  . Melatonin 3 MG TABS Take 1 tablet (3 mg total) by mouth daily at 10 pm. (Patient not taking: Reported on 10/22/2015) 30 tablet 0  . [DISCONTINUED] modafinil (PROVIGIL) 100 MG tablet Take 100 mg by mouth daily.    . [DISCONTINUED] potassium chloride (MICRO-K) 10 MEQ CR capsule Take 1 capsule (10 mEq total) by mouth 2 (two) times daily. 180 capsule 1   No current facility-administered medications for this visit.    Allergies as of 10/22/2015 - Review Complete 10/22/2015  Allergen Reaction Noted  . Penicillins    . Shellfish allergy Rash 08/11/2011  . Sulfonamide derivatives    . Azithromycin  06/27/2014  . Codeine  10/16/2009    Vitals: BP 186/90 mmHg  Pulse 86  Resp 20  Ht 5\' 4"  (1.626 m)  Wt 197 lb (89.359 kg)  BMI 33.80 kg/m2 Last Weight:  Wt Readings from Last 1 Encounters:  10/22/15 197 lb (89.359 kg)   Last  Height:   Ht Readings from Last 1 Encounters:  10/22/15 5\' 4"  (1.626 m)    Physical exam:  General: The patient is awake, alert and appears not in acute distress. The patient is well groomed. Head: Normocephalic, atraumatic. Neck is supple. Mallampati3 neck circumference: 14.5 inches , no nasal deviation. No retrognathia.  Cardiovascular:  Regular rate and rhythm, without  murmurs or carotid bruit, and without distended neck veins. Respiratory: Lungs are clear to auscultation. Skin:  Without evidence of edema, or rash Trunk: BMI is elevated and patient  has normal posture. Neurologic exam :The patient is awake and alert, oriented to place and time.  Mood and affect are appropriate. Cranial nerves: Pupils are equal and briskly reactive to light. Funduscopic exam without  evidence of pallor or edema. Extraocular movements  in vertical and horizontal planes intact and without nystagmus. Visual fields by finger perimetry are intact. Hearing to finger rub intact. Facial sensation intact to fine touch. Facial motor strength is symmetric and tongue and uvula move midline.   Assessment : Patient with chronic insomnia related to a long-standing shift work sleep disorder-circadian rhythm disorder.  In addition high fatigue due to autoimmune disease-rheumatoid arthritis. The high degree of fatigue as reflected and the fatigue severity scale she is not excessively daytime sleepy anymore. I refilled her Ambien; She is using 10 mg each night for many years now. I have had a long discussion about unisom to be used instead of a prescription medicine or to replace the Ambien 2-3 nights out of the week. This way the Ambien would work better and over a longer period of time. It has worked well for her !   Advise: Please remember to try to maintain good sleep hygiene, which means: Keep a regular sleep  and wake schedule, try not to exercise or have a meal within 2 hours of your bedtime, try to keep your bedroom  conducive for sleep, that is, cool and dark, without light distractors such as an illuminated alarm clock, and refrain from watching TV right before sleep or in the middle of the night and do not keep the TV or radio on during the night. Also, try not to use or play on electronic devices at bedtime, such as your cell phone, tablet PC or laptop. If you like to read at bedtime on an electronic device, try to dim the background light as much as possible. Do not eat in the middle of the night.  For chronic insomnia, Mrs. Losano would be best followed by a psychiatrist and/or sleep psychologist.   Mrs. Schillo underwent a polysomnography study in [place] over a decade ago, she has not been reevaluated since but at that time did not have apnea. It would be beneficial to have a sleep study done to rule out organic reasons that may cause her chronic insomnia or at least contribute to it. Sleep hygiene instructions were given above and will be placed on the patient instructions.  Anitria Andon, MD     Rv in 52 month with NP  Cc; Unice Cobble, MD  Celso Amy,

## 2015-10-31 DIAGNOSIS — H16223 Keratoconjunctivitis sicca, not specified as Sjogren's, bilateral: Secondary | ICD-10-CM | POA: Diagnosis not present

## 2015-10-31 DIAGNOSIS — H40013 Open angle with borderline findings, low risk, bilateral: Secondary | ICD-10-CM | POA: Diagnosis not present

## 2015-10-31 DIAGNOSIS — H04123 Dry eye syndrome of bilateral lacrimal glands: Secondary | ICD-10-CM | POA: Diagnosis not present

## 2015-11-06 DIAGNOSIS — M25511 Pain in right shoulder: Secondary | ICD-10-CM | POA: Diagnosis not present

## 2015-11-06 DIAGNOSIS — D860 Sarcoidosis of lung: Secondary | ICD-10-CM | POA: Diagnosis not present

## 2015-11-06 DIAGNOSIS — M5136 Other intervertebral disc degeneration, lumbar region: Secondary | ICD-10-CM | POA: Diagnosis not present

## 2015-11-06 DIAGNOSIS — M150 Primary generalized (osteo)arthritis: Secondary | ICD-10-CM | POA: Diagnosis not present

## 2015-11-06 DIAGNOSIS — M0589 Other rheumatoid arthritis with rheumatoid factor of multiple sites: Secondary | ICD-10-CM | POA: Diagnosis not present

## 2015-11-06 DIAGNOSIS — N183 Chronic kidney disease, stage 3 (moderate): Secondary | ICD-10-CM | POA: Diagnosis not present

## 2015-11-10 DIAGNOSIS — L811 Chloasma: Secondary | ICD-10-CM | POA: Diagnosis not present

## 2015-11-10 DIAGNOSIS — L818 Other specified disorders of pigmentation: Secondary | ICD-10-CM | POA: Diagnosis not present

## 2015-12-23 ENCOUNTER — Ambulatory Visit: Payer: Medicare Other | Admitting: Internal Medicine

## 2015-12-31 ENCOUNTER — Encounter: Payer: Self-pay | Admitting: Internal Medicine

## 2015-12-31 ENCOUNTER — Other Ambulatory Visit (INDEPENDENT_AMBULATORY_CARE_PROVIDER_SITE_OTHER): Payer: Medicare Other

## 2015-12-31 ENCOUNTER — Ambulatory Visit (INDEPENDENT_AMBULATORY_CARE_PROVIDER_SITE_OTHER): Payer: Medicare Other | Admitting: Internal Medicine

## 2015-12-31 VITALS — BP 158/76 | HR 93 | Temp 98.6°F | Ht 64.0 in | Wt 200.0 lb

## 2015-12-31 DIAGNOSIS — Z0000 Encounter for general adult medical examination without abnormal findings: Secondary | ICD-10-CM | POA: Diagnosis not present

## 2015-12-31 DIAGNOSIS — E785 Hyperlipidemia, unspecified: Secondary | ICD-10-CM

## 2015-12-31 DIAGNOSIS — K219 Gastro-esophageal reflux disease without esophagitis: Secondary | ICD-10-CM

## 2015-12-31 DIAGNOSIS — I10 Essential (primary) hypertension: Secondary | ICD-10-CM

## 2015-12-31 DIAGNOSIS — D649 Anemia, unspecified: Secondary | ICD-10-CM

## 2015-12-31 DIAGNOSIS — N289 Disorder of kidney and ureter, unspecified: Secondary | ICD-10-CM

## 2015-12-31 DIAGNOSIS — Z1159 Encounter for screening for other viral diseases: Secondary | ICD-10-CM | POA: Diagnosis not present

## 2015-12-31 DIAGNOSIS — R739 Hyperglycemia, unspecified: Secondary | ICD-10-CM

## 2015-12-31 DIAGNOSIS — D472 Monoclonal gammopathy: Secondary | ICD-10-CM

## 2015-12-31 DIAGNOSIS — E890 Postprocedural hypothyroidism: Secondary | ICD-10-CM

## 2015-12-31 LAB — IRON: Iron: 119 ug/dL (ref 42–145)

## 2015-12-31 LAB — CBC WITH DIFFERENTIAL/PLATELET
Basophils Absolute: 0 10*3/uL (ref 0.0–0.1)
Basophils Relative: 0.6 % (ref 0.0–3.0)
Eosinophils Absolute: 0.1 10*3/uL (ref 0.0–0.7)
Eosinophils Relative: 3.1 % (ref 0.0–5.0)
HCT: 31.3 % — ABNORMAL LOW (ref 36.0–46.0)
Hemoglobin: 10.4 g/dL — ABNORMAL LOW (ref 12.0–15.0)
Lymphocytes Relative: 47.6 % — ABNORMAL HIGH (ref 12.0–46.0)
Lymphs Abs: 2 10*3/uL (ref 0.7–4.0)
MCHC: 33.4 g/dL (ref 30.0–36.0)
MCV: 95.3 fl (ref 78.0–100.0)
Monocytes Absolute: 0.6 10*3/uL (ref 0.1–1.0)
Monocytes Relative: 15.2 % — ABNORMAL HIGH (ref 3.0–12.0)
Neutro Abs: 1.4 10*3/uL (ref 1.4–7.7)
Neutrophils Relative %: 33.5 % — ABNORMAL LOW (ref 43.0–77.0)
Platelets: 249 10*3/uL (ref 150.0–400.0)
RBC: 3.28 Mil/uL — ABNORMAL LOW (ref 3.87–5.11)
RDW: 14.5 % (ref 11.5–15.5)
WBC: 4.1 10*3/uL (ref 4.0–10.5)

## 2015-12-31 LAB — TSH: TSH: 0.33 u[IU]/mL — ABNORMAL LOW (ref 0.35–4.50)

## 2015-12-31 LAB — LIPID PANEL
Cholesterol: 310 mg/dL — ABNORMAL HIGH (ref 0–200)
HDL: 90.2 mg/dL (ref >39.00)
LDL Cholesterol: 198 mg/dL — ABNORMAL HIGH (ref 0–99)
NonHDL: 219.93
Total CHOL/HDL Ratio: 3
Triglycerides: 109 mg/dL (ref 0.0–149.0)
VLDL: 21.8 mg/dL (ref 0.0–40.0)

## 2015-12-31 LAB — COMPREHENSIVE METABOLIC PANEL
ALT: 20 U/L (ref 0–35)
AST: 19 U/L (ref 0–37)
Albumin: 4.6 g/dL (ref 3.5–5.2)
Alkaline Phosphatase: 49 U/L (ref 39–117)
BUN: 26 mg/dL — ABNORMAL HIGH (ref 6–23)
CO2: 28 mEq/L (ref 19–32)
Calcium: 9.5 mg/dL (ref 8.4–10.5)
Chloride: 107 mEq/L (ref 96–112)
Creatinine, Ser: 1.51 mg/dL — ABNORMAL HIGH (ref 0.40–1.20)
GFR: 43.85 mL/min — ABNORMAL LOW (ref >60.00)
Glucose, Bld: 107 mg/dL — ABNORMAL HIGH (ref 70–99)
Potassium: 3.9 mEq/L (ref 3.5–5.1)
Sodium: 143 mEq/L (ref 135–145)
Total Bilirubin: 0.4 mg/dL (ref 0.2–1.2)
Total Protein: 7.8 g/dL (ref 6.0–8.3)

## 2015-12-31 LAB — HEMOGLOBIN A1C: Hgb A1c MFr Bld: 5.1 % (ref 4.6–6.5)

## 2015-12-31 LAB — FERRITIN: Ferritin: 101.5 ng/mL (ref 10.0–291.0)

## 2015-12-31 NOTE — Progress Notes (Signed)
Subjective:   Tina Molina is a 70 y.o. female who presents for Medicare Annual (Subsequent) preventive examination.  Review of Systems: by Dr. Quay Burow at apt today post AWV   HRA completed during the assessment;   Lifestyle; (family OA; DM; HD; Glaucoma; HF; HTN)  Former Smoker; 05/1997 ETOH Rare; on special occasions  BP has been elevated recently visits  Neurology following insomnia  A1c normal Lipids (chol 249; trig 108; HDL 65 and LDL 162   BMI 34; Goal would be to lose weight; Goal 175  Hx of losing weight;  Was 165 at some point during the last 5 diet;  Went through divorce  Had back surgery x 3 weeks ago;   Diet: May eat breakfast; eating toast and eggs and coffee Grab toast Gets up at different times due to insomnia  Supper; sister cooks; and you fix sandwiches Does eat salads;  Eat fried; fast foods;   Exercise Stopped x 3 years ago Cleans the home;   Barriers thyroid and insomnia;  Neuro is helping with sleep habits; took sleep test Worked 3rd shift for many years  Is taking Ambien;  Can't sleep but 1 hour at hs Goes to bed in the am;   Education provided;   SAFETY Generalized Safety in the home reviewed/  Removing clutter and tripping hazzards,and grab bars in the bathroom; take shower; in the tub;  good lighting; annual eye exam; Thinks she tripped     Review Firearm safety as appropriate;  Smoke detectors; yes Assessed for community safety; yes  Driving: no  Sun protection; yes' dermatologist has told her to wear sunblock    Hearing; 4000 hz   Eye exam; had cataract surgery in April Every 2 weeks; every month / Dr. Peter Garter  No other issues; sarcoidosis in eye but is resolved now   Overdue screens:  Hep C/ educate Zostavax/ not sure if she had the chicken pox.  Has sarcodosis but is in remission currently; Last flare in 2000; and affected eyes  See Dr. Amil Amen apt in Sept / may ask about shinges  vaccine  [facility]   Colonoscopy due 09/2017 Dexa scan has been completed   Advanced Directive; Reviewed advanced directive and agreed to receipt of information and discussion.  Focused face to face x  20 minutes discussing HCPOA and Living will and reviewed all the questions in the [place] forms. The patient voices understanding of HCPOA; LW reviewed and information provided on each question. Educated on how to revoke this HCPOA or LW at any time.   Also  discussed life prolonging measures (given a few examples) and where she could choose to initiate or not;  the ability to give the HCPOA power to change her living will or not if she cannot speak for herself; as well as finalizing the will by 2 unrelated witnesses and notary.  Will call for questions and given information on [facility] pastoral department for further assistance.       Cardiac Risk Factors include: advanced age (>53men, >54 women);dyslipidemia;family history of premature cardiovascular disease;hypertension;obesity (BMI >30kg/m2);sedentary lifestyleDr. Trenton Gammon did back surgery      Objective:     Vitals: BP (!) 150/60   Pulse 93   Temp 98.6 F (37 C) (Oral)   Ht 5\' 4"  (1.626 m)   Wt 200 lb (90.7 kg)   SpO2 97%   BMI 34.33 kg/m   Body mass index is 34.33 kg/m.  Tobacco History  Smoking Status  . Former Smoker  . Packs/day: 0.50  . Years: 20.00  . Types: Cigarettes  . Quit date: 05/17/1997  Smokeless Tobacco  . Never Used     Counseling given: Yes   Past Medical History:  Diagnosis Date  . Anemia    Associated with leukopenia and lymphocytosis. This is been evaluated by Dr. Marin Olp . Her platelet count has been normal  . Arthritis   . Chronic back pain   . Chronic kidney disease   . GERD (gastroesophageal reflux disease)   . Hypertension   . Lymphocytosis   . MGUS (monoclonal gammopathy of unknown significance) 08/03/2012   IgA 792 11/04/11  . Pneumonia 04/2010   OP  . Renal insufficiency   .  Sarcoidosis ([place])   . Thyroid disease    Past Surgical History:  Procedure Laterality Date  . CARPAL TUNNEL RELEASE     Bilateral   . CATARACT EXTRACTION Right 05/2015  . CERVICAL DISCECTOMY  06/2010   Dr.Pool  . CHOLECYSTECTOMY    . COLONOSCOPY  2010  . POSTERIOR LUMBAR FUSION  02/29/2012   Dr Alyson Locket op respiratory & renal compromise  . THYROIDECTOMY  2003   For goiter   Family History  Problem Relation Age of Onset  . Anemia Father   . Arthritis Father   . Heart failure Father 65    No CAD  . Diabetes Mother   . Arthritis Mother   . Glaucoma Mother   . Heart disease Mother 9    No CAD  . Hypertension Brother   . Anemia Brother   . Kidney disease Brother   . Hypertension Sister   . Kidney disease Sister   . Anemia Sister   . Diabetes Maternal Aunt   . Arthritis Paternal Grandmother    History  Sexual Activity  . Sexual activity: Not on file    Outpatient Encounter Prescriptions as of 12/31/2015  Medication Sig  . aspirin 81 MG tablet Take 81 mg by mouth daily.   . budesonide-formoterol (SYMBICORT) 160-4.5 MCG/ACT inhaler Inhale 2 puffs into the lungs as needed. For wheezing  . carvedilol (COREG) 25 MG tablet Take 1 tablet (25 mg total) by mouth 2 (two) times daily.  . cyclobenzaprine (FLEXERIL) 10 MG tablet TAKE 1/2 TO 1 TABLET BY MOUTH AT BEDTIME IF NEEDED DO NOT TAKE WITH AMBIEN OR OTHER SLEEP AID  . gabapentin (NEURONTIN) 300 MG capsule take 1 capsule by mouth every 8 hours if needed  . hydrALAZINE (APRESOLINE) 50 MG tablet Take 1 tablet (50 mg total) by mouth 3 (three) times daily.  Marland Kitchen leflunomide (ARAVA) 20 MG tablet Take 20 mg by mouth daily.  Marland Kitchen levothyroxine (SYNTHROID, LEVOTHROID) 150 MCG tablet take 1 tablet by mouth once daily EXCEPT 1/2 TABLET ON WEDNESDAYS  . losartan (COZAAR) 100 MG tablet Take 1 tablet (100 mg total) by mouth daily.  . Multiple Vitamin (MULTIVITAMIN WITH MINERALS) TABS Take 1 tablet by mouth daily.  . ranitidine (ZANTAC) 150 MG  tablet Take 1 tablet (150 mg total) by mouth 2 (two) times daily.  Marland Kitchen spironolactone (ALDACTONE) 50 MG tablet Take 0.5 tablets (25 mg total) by mouth daily.  . traMADol (ULTRAM) 50 MG tablet Take 50 mg by mouth every 6 (six) hours as needed.  . vitamin E 400 UNIT capsule Take 400 Units by mouth daily.   Marland Kitchen zolpidem (AMBIEN) 10 MG tablet Take 1 tablet (10 mg total) by mouth at bedtime as needed for  sleep.  . Melatonin 3 MG TABS Take 1 tablet (3 mg total) by mouth daily at 10 pm. (Patient not taking: Reported on 10/22/2015)   No facility-administered encounter medications on file as of 12/31/2015.     Activities of Daily Living In your present state of health, do you have any difficulty performing the following activities: 12/31/2015  Hearing? N  Vision? N  Difficulty concentrating or making decisions? N  Walking or climbing stairs? N  Dressing or bathing? N  Doing errands, shopping? N  Preparing Food and eating ? N  Using the Toilet? N  In the past six months, have you accidently leaked urine? N  Do you have problems with loss of bowel control? N  Managing your Medications? N  Managing your Finances? N  Housekeeping or managing your Housekeeping? N  Some recent data might be hidden    Patient Care Team: Binnie Rail, MD as PCP - General (Internal Medicine)    Assessment:      Exercise Activities and Dietary recommendations Current Exercise Habits: Home exercise routine (sleep is interrupting exercise currently )  Goals    . patient          Try to eat less fried foods;        Fall Risk Fall Risk  12/31/2015 08/20/2013 05/30/2013  Falls in the past year? Yes No Yes  Number falls in past yr: 1 - 1  Injury with Fall? - - No  Risk for fall due to : - Other (Comment) -  Risk for fall due to (comments): - Post spinal surgery  -  Follow up Education provided - -   Depression Screen PHQ 2/9 Scores 12/31/2015 08/20/2013 05/30/2013  PHQ - 2 Score 0 0 0     Cognitive Testing No  flowsheet data found.  Immunization History  Administered Date(s) Administered  . Influenza Whole 01/20/2010  . Influenza, High Dose Seasonal PF 03/01/2013, 03/13/2015  . Pneumococcal Conjugate-13 01/13/2015  . Pneumococcal Polysaccharide-23 05/30/2013  . Td 06/01/2009   Screening Tests Health Maintenance  Topic Date Due  . Hepatitis C Screening  [birth year]  . ZOSTAVAX  04/06/2006  . MAMMOGRAM  02/01/2015  . INFLUENZA VACCINE  12/16/2015  . COLONOSCOPY  10/03/2017  . TETANUS/TDAP  06/02/2019  . DEXA SCAN  Completed  . PNA vac Low Risk Adult  Completed      Plan:   To draw Hep c  Will ask Dr. Amil Amen about shingles vaccine  Discussed Advanced Directive  offers free advance directive forms, as well as assistance in completing the forms themselves.  For assistance, contact the Spiritual Care Department at [phone], or the Clinical Social Work Department at [phone].  [facility] the [facility] and schedule mammogram   During the course of the visit the patient was educated and counseled about the following appropriate screening and preventive services:   Vaccines to include Pneumoccal, Influenza, Hepatitis B, Td, Zostavax, HCV  Electrocardiogram  Cardiovascular Disease  Colorectal cancer screening  Bone density screening  Diabetes screening  Glaucoma screening  Mammography/PAP  Nutrition counseling   Patient Instructions (the written plan) was given to the patient.   Wynetta Fines, RN  12/31/2015

## 2015-12-31 NOTE — Assessment & Plan Note (Signed)
Check A1c Stressed exercise and weight loss

## 2015-12-31 NOTE — Assessment & Plan Note (Signed)
Check cbc, iron/ferritn

## 2015-12-31 NOTE — Assessment & Plan Note (Addendum)
BP not controlled BP goal < 140/90 She is not compliant with a low-sodium diet and is currently not exercising She declined changes in her medication at this time and wants to work on lifestyle before adding an additional medication Advised her to start monitoring her blood pressure at home

## 2015-12-31 NOTE — Patient Instructions (Addendum)
  Tina Molina , Thank you for taking time to come for your Medicare Wellness Visit. I appreciate your ongoing commitment to your health goals. Please review the following plan we discussed and let me know if I can assist you in the future.   To draw Hep c  Will ask Dr. Amil Molina about shingles vaccine  Discussed Advanced Directive [name] offers free advance directive forms, as well as assistance in completing the forms themselves.  For assistance, contact the Spiritual Care Department at [phone], or the Clinical Social Work Department at [phone].  [facility] and schedule mammogram    These are the goals we discussed: Goals    . patient          Try to eat less fried foods;         This is a list of the screening recommended for you and due dates:  Health Maintenance  Topic Date Due  .  Hepatitis C: One time screening is recommended by Center for Disease Control  (CDC) for  adults born from [birth year] through 1965.   01-22-1946  . Shingles Vaccine  04/06/2006  . Mammogram  02/01/2015  . Flu Shot  12/16/2015  . Colon Cancer Screening  10/03/2017  . Tetanus Vaccine  06/02/2019  . DEXA scan (bone density measurement)  Completed  . Pneumonia vaccines  Completed      Test(s) ordered today. Your results will be released to [place] (or called to you) after review, usually within 72hours after test completion. If any changes need to be made, you will be notified at that same time.  All other Health Maintenance issues reviewed.   All recommended immunizations and age-appropriate screenings are up-to-date or discussed.  No immunizations administered today.   Medications reviewed and updated.  No changes recommended at this time.   Please followup in 2-3 months

## 2015-12-31 NOTE — Assessment & Plan Note (Signed)
Check tsh  Titrate med dose if needed  

## 2015-12-31 NOTE — Assessment & Plan Note (Signed)
GERD controlled Continue daily medication  

## 2015-12-31 NOTE — Progress Notes (Signed)
Subjective:    Patient ID: Gailen Shelter, female    DOB: [birth year], 70 y.o.   MRN: [id number]  HPI She is here for follow up.  Hypertension: She is taking her medication daily. She is not compliant with a low sodium diet.  She denies chest pain, palpitations and regular headaches. She is not exercising regularly.  She does monitor her blood pressure at home and it does run high.    Hypothyroidism: She is taking her medication daily as prescribed. She does feel more tired, but feels that can be explained by her current life situations.  GERD:  She is taking her medication daily as prescribed.  She denies any GERD symptoms and feels her GERD is well controlled.   Hyperlipidemia: She is taking her medication daily. She is compliant with a low fat/cholesterol diet. She is not exercising regularly. She denies myalgias.   Renal insufficiency: She does not take any NSAIDs on a regular basis. She does drink water throughout the day.   Medications and allergies reviewed with patient and updated if appropriate.  Patient Active Problem List   Diagnosis Date Noted  . Chronic insomnia 08/12/2015  . GERD (gastroesophageal reflux disease) 06/25/2015  . Hyperlipidemia 01/13/2015  . Hyperglycemia 01/13/2015  . Circadian rhythm sleep disorder 08/14/2014  . Rheumatoid factor positive 02/20/2014  . Renal insufficiency 11/03/2013  . Thoracic radiculopathy 10/30/2013  . Circadian rhythm sleep disorder, shift work type 11/03/2012  . Hematuria 08/05/2012  . MGUS (monoclonal gammopathy of unknown significance) 08/03/2012  . Hypotension 03/04/2012  . Acute respiratory failure ([place]) 03/04/2012  . Lumbar stenosis with neurogenic claudication 02/29/2012  . Abnormal CT scan 10/15/2011  . Edema 09/13/2011  . Abnormal complete blood count 05/27/2011  . HERNIATED LUMBAR DISC 12/19/2009  . PULMONARY SARCOIDOSIS 10/16/2009  . Hypothyroidism 10/16/2009  . ANEMIA-NOS 10/16/2009  . CARPAL TUNNEL  SYNDROME, BILATERAL 10/16/2009  . UVEITIS 10/16/2009  . Essential hypertension 10/16/2009  . ARTHRITIS 10/16/2009  . CERVICAL DISC DISORDER 10/16/2009    Current Outpatient Prescriptions on File Prior to Visit  Medication Sig Dispense Refill  . aspirin 81 MG tablet Take 81 mg by mouth daily.     . budesonide-formoterol (SYMBICORT) 160-4.5 MCG/ACT inhaler Inhale 2 puffs into the lungs as needed. For wheezing 1 Inhaler 5  . carvedilol (COREG) 25 MG tablet Take 1 tablet (25 mg total) by mouth 2 (two) times daily. 180 tablet 2  . cyclobenzaprine (FLEXERIL) 10 MG tablet TAKE 1/2 TO 1 TABLET BY MOUTH AT BEDTIME IF NEEDED DO NOT TAKE WITH AMBIEN OR OTHER SLEEP AID 10 tablet 0  . gabapentin (NEURONTIN) 300 MG capsule take 1 capsule by mouth every 8 hours if needed 90 capsule 3  . hydrALAZINE (APRESOLINE) 50 MG tablet Take 1 tablet (50 mg total) by mouth 3 (three) times daily. 270 tablet 3  . leflunomide (ARAVA) 20 MG tablet Take 20 mg by mouth daily.    Marland Kitchen levothyroxine (SYNTHROID, LEVOTHROID) 150 MCG tablet take 1 tablet by mouth once daily EXCEPT 1/2 TABLET ON WEDNESDAYS 90 tablet 3  . losartan (COZAAR) 100 MG tablet Take 1 tablet (100 mg total) by mouth daily. 90 tablet 2  . Multiple Vitamin (MULTIVITAMIN WITH MINERALS) TABS Take 1 tablet by mouth daily.    . ranitidine (ZANTAC) 150 MG tablet Take 1 tablet (150 mg total) by mouth 2 (two) times daily. 180 tablet 2  . spironolactone (ALDACTONE) 50 MG tablet Take 0.5 tablets (25 mg total) by mouth daily.  90 tablet 3  . traMADol (ULTRAM) 50 MG tablet Take 50 mg by mouth every 6 (six) hours as needed.    . vitamin E 400 UNIT capsule Take 400 Units by mouth daily.     Marland Kitchen zolpidem (AMBIEN) 10 MG tablet Take 1 tablet (10 mg total) by mouth at bedtime as needed for sleep. 90 tablet 3  . Melatonin 3 MG TABS Take 1 tablet (3 mg total) by mouth daily at 10 pm. (Patient not taking: Reported on 10/22/2015) 30 tablet 0  . [DISCONTINUED] modafinil (PROVIGIL) 100 MG  tablet Take 100 mg by mouth daily.    . [DISCONTINUED] potassium chloride (MICRO-K) 10 MEQ CR capsule Take 1 capsule (10 mEq total) by mouth 2 (two) times daily. 180 capsule 1   No current facility-administered medications on file prior to visit.     Past Medical History:  Diagnosis Date  . Anemia    Associated with leukopenia and lymphocytosis. This is been evaluated by Dr. Marin Olp . Her platelet count has been normal  . Arthritis   . Chronic back pain   . Chronic kidney disease   . GERD (gastroesophageal reflux disease)   . Hypertension   . Lymphocytosis   . MGUS (monoclonal gammopathy of unknown significance) 08/03/2012   IgA 792 11/04/11  . Pneumonia 04/2010   OP  . Renal insufficiency   . Sarcoidosis ([place])   . Thyroid disease     Past Surgical History:  Procedure Laterality Date  . CARPAL TUNNEL RELEASE     Bilateral   . CATARACT EXTRACTION Right 05/2015  . CERVICAL DISCECTOMY  06/2010   Dr.Pool  . CHOLECYSTECTOMY    . COLONOSCOPY  2010  . POSTERIOR LUMBAR FUSION  02/29/2012   Dr Alyson Locket op respiratory & renal compromise  . THYROIDECTOMY  2003   For goiter    Social History   Social History  . Marital status: Legally Separated    Spouse name: N/A  . Number of children: 0  . Years of education: 12   Occupational History  . Retired Retired   Social History Main Topics  . Smoking status: Former Smoker    Packs/day: 0.50    Years: 20.00    Types: Cigarettes    Quit date: 05/17/1997  . Smokeless tobacco: Never Used  . Alcohol use 0.6 oz/week    1 Glasses of wine per week     Comment: occasionally   . Drug use: No  . Sexual activity: Not on file   Other Topics Concern  . Not on file   Social History Narrative   Patient is divorced and lives with her sister. Patient is retired.   Caffeine- sometimes - one cup   Right handed.    Family History  Problem Relation Age of Onset  . Anemia Father   . Arthritis Father   . Heart failure Father 44    No  CAD  . Diabetes Mother   . Arthritis Mother   . Glaucoma Mother   . Heart disease Mother 54    No CAD  . Hypertension Brother   . Anemia Brother   . Kidney disease Brother   . Hypertension Sister   . Kidney disease Sister   . Anemia Sister   . Diabetes Maternal Aunt   . Arthritis Paternal Grandmother     Review of Systems  Constitutional: Positive for fatigue (low energy). Negative for fever.  Respiratory: Positive for shortness of breath (with prolonged activity). Negative for  cough and wheezing.   Cardiovascular: Positive for leg swelling (varies). Negative for chest pain and palpitations.  Gastrointestinal: Negative for abdominal pain and nausea.       Gerd controlled  Neurological: Negative for dizziness, light-headedness and headaches.       Objective:   Vitals:   12/31/15 1423 12/31/15 1548  BP: (!) 150/60 (!) 158/76  Pulse: 93   Temp: 98.6 F (37 C)    Filed Weights   12/31/15 1423  Weight: 200 lb (90.7 kg)   Body mass index is 34.33 kg/m.   Physical Exam     Constitutional: Appears well-developed and well-nourished. No distress.  HENT:  Head: Normocephalic and atraumatic.  Neck: Neck supple. No tracheal deviation present. No thyromegaly present.  Cardiovascular: Normal rate, regular rhythm and normal heart sounds.   No murmur heard. No carotid bruit  Pulmonary/Chest: Effort normal and breath sounds normal. No respiratory distress. No has no wheezes. No rales.  Musculoskeletal: trace edema.  Lymphadenopathy: No cervical adenopathy.  Skin: Skin is warm and dry. Not diaphoretic.  Psychiatric: Normal mood and affect. Behavior is normal.     Assessment & Plan:    See Problem List for Assessment and Plan of chronic medical problems.   F/u 3 months

## 2015-12-31 NOTE — Assessment & Plan Note (Signed)
Check lipid panel, CMP.

## 2015-12-31 NOTE — Assessment & Plan Note (Signed)
cmp

## 2016-01-01 LAB — HEPATITIS C ANTIBODY: HCV Ab: NEGATIVE

## 2016-01-07 ENCOUNTER — Telehealth: Payer: Self-pay | Admitting: Internal Medicine

## 2016-01-07 NOTE — Telephone Encounter (Signed)
Patient called to check on the status of cholesterol meds. States that she hasnt heard anything nothing showing being prescribed. Please send prescription as noted in lab results.   Please give her a call and let her know

## 2016-01-08 MED ORDER — ATORVASTATIN CALCIUM 20 MG PO TABS: 20.0000 mg | ORAL_TABLET | Freq: Every day | ORAL | 3 refills | Status: DC

## 2016-01-08 NOTE — Telephone Encounter (Signed)
Please advise on which cholesterol medication you would like sent.

## 2016-01-08 NOTE — Telephone Encounter (Signed)
Sent to pof - recheck cmp, lipid panel in 2 months (please order)

## 2016-01-08 NOTE — Telephone Encounter (Signed)
Spoke with pt to inform. Pt asked if she could have her blood work done at her next appt. Informed that she must be fasting.

## 2016-01-23 DIAGNOSIS — M5415 Radiculopathy, thoracolumbar region: Secondary | ICD-10-CM | POA: Diagnosis not present

## 2016-02-05 ENCOUNTER — Other Ambulatory Visit: Payer: Self-pay | Admitting: Oncology

## 2016-02-05 DIAGNOSIS — D649 Anemia, unspecified: Secondary | ICD-10-CM

## 2016-02-05 DIAGNOSIS — D472 Monoclonal gammopathy: Secondary | ICD-10-CM

## 2016-02-09 DIAGNOSIS — D860 Sarcoidosis of lung: Secondary | ICD-10-CM | POA: Diagnosis not present

## 2016-02-09 DIAGNOSIS — M25511 Pain in right shoulder: Secondary | ICD-10-CM | POA: Diagnosis not present

## 2016-02-09 DIAGNOSIS — M150 Primary generalized (osteo)arthritis: Secondary | ICD-10-CM | POA: Diagnosis not present

## 2016-02-09 DIAGNOSIS — M0589 Other rheumatoid arthritis with rheumatoid factor of multiple sites: Secondary | ICD-10-CM | POA: Diagnosis not present

## 2016-02-09 DIAGNOSIS — N183 Chronic kidney disease, stage 3 (moderate): Secondary | ICD-10-CM | POA: Diagnosis not present

## 2016-02-09 DIAGNOSIS — M5136 Other intervertebral disc degeneration, lumbar region: Secondary | ICD-10-CM | POA: Diagnosis not present

## 2016-02-10 ENCOUNTER — Other Ambulatory Visit: Payer: Medicare Other

## 2016-02-10 ENCOUNTER — Ambulatory Visit: Payer: Medicare Other | Admitting: Hematology & Oncology

## 2016-02-10 ENCOUNTER — Other Ambulatory Visit: Payer: Self-pay | Admitting: *Deleted

## 2016-02-11 ENCOUNTER — Encounter: Payer: Self-pay | Admitting: Hematology & Oncology

## 2016-02-11 ENCOUNTER — Ambulatory Visit (HOSPITAL_BASED_OUTPATIENT_CLINIC_OR_DEPARTMENT_OTHER): Payer: Medicare Other | Admitting: Hematology & Oncology

## 2016-02-11 ENCOUNTER — Other Ambulatory Visit (HOSPITAL_BASED_OUTPATIENT_CLINIC_OR_DEPARTMENT_OTHER): Payer: Medicare Other

## 2016-02-11 VITALS — BP 145/64 | HR 83 | Temp 98.3°F | Resp 16 | Ht 64.0 in | Wt 202.4 lb

## 2016-02-11 DIAGNOSIS — D649 Anemia, unspecified: Secondary | ICD-10-CM

## 2016-02-11 DIAGNOSIS — E079 Disorder of thyroid, unspecified: Secondary | ICD-10-CM

## 2016-02-11 DIAGNOSIS — D472 Monoclonal gammopathy: Secondary | ICD-10-CM

## 2016-02-11 DIAGNOSIS — N189 Chronic kidney disease, unspecified: Secondary | ICD-10-CM

## 2016-02-11 DIAGNOSIS — N289 Disorder of kidney and ureter, unspecified: Secondary | ICD-10-CM | POA: Diagnosis not present

## 2016-02-11 DIAGNOSIS — D631 Anemia in chronic kidney disease: Secondary | ICD-10-CM

## 2016-02-11 LAB — COMPREHENSIVE METABOLIC PANEL (CC13)
ALT: 19 IU/L (ref 0–32)
AST (SGOT): 18 IU/L (ref 0–40)
Albumin, Serum: 4.2 g/dL (ref 3.6–4.8)
Albumin/Globulin Ratio: 1.4 (ref 1.2–2.2)
Alkaline Phosphatase, S: 63 IU/L (ref 39–117)
BUN/Creatinine Ratio: 16 (ref 12–28)
BUN: 25 mg/dL (ref 8–27)
Bilirubin Total: 0.2 mg/dL (ref 0.0–1.2)
Calcium, Ser: 8.2 mg/dL — ABNORMAL LOW (ref 8.7–10.3)
Carbon Dioxide, Total: 23 mmol/L (ref 18–29)
Chloride, Ser: 107 mmol/L — ABNORMAL HIGH (ref 96–106)
Creatinine, Ser: 1.58 mg/dL — ABNORMAL HIGH (ref 0.57–1.00)
GFR calc Af Amer: 38 mL/min/{1.73_m2} — ABNORMAL LOW (ref >59)
GFR calc non Af Amer: 33 mL/min/{1.73_m2} — ABNORMAL LOW (ref >59)
Globulin, Total: 3 g/dL (ref 1.5–4.5)
Glucose: 90 mg/dL (ref 65–99)
Potassium, Ser: 3.7 mmol/L (ref 3.5–5.2)
Sodium: 134 mmol/L (ref 134–144)
Total Protein: 7.2 g/dL (ref 6.0–8.5)

## 2016-02-11 LAB — CBC WITH DIFFERENTIAL (CANCER CENTER ONLY)
BASO#: 0 10*3/uL (ref 0.0–0.2)
BASO%: 1 % (ref 0.0–2.0)
EOS%: 3.2 % (ref 0.0–7.0)
Eosinophils Absolute: 0.1 10*3/uL (ref 0.0–0.5)
HCT: 27.2 % — ABNORMAL LOW (ref 34.8–46.6)
HGB: 9 g/dL — ABNORMAL LOW (ref 11.6–15.9)
LYMPH#: 2.1 10*3/uL (ref 0.9–3.3)
LYMPH%: 51.5 % — ABNORMAL HIGH (ref 14.0–48.0)
MCH: 32.6 pg (ref 26.0–34.0)
MCHC: 33.1 g/dL (ref 32.0–36.0)
MCV: 99 fL (ref 81–101)
MONO#: 0.5 10*3/uL (ref 0.1–0.9)
MONO%: 11.8 % (ref 0.0–13.0)
NEUT#: 1.3 10*3/uL — ABNORMAL LOW (ref 1.5–6.5)
NEUT%: 32.5 % — ABNORMAL LOW (ref 39.6–80.0)
Platelets: 205 10*3/uL (ref 145–400)
RBC: 2.76 10*6/uL — ABNORMAL LOW (ref 3.70–5.32)
RDW: 12.6 % (ref 11.1–15.7)
WBC: 4.1 10*3/uL (ref 3.9–10.0)

## 2016-02-11 NOTE — Progress Notes (Signed)
Referral MD  Reason for Referral: anemia due to renal insufficiency                                   Iron deficiency anemia                                   IgA Kappa MGUS  Chief Complaint  Patient presents with  . Follow-up  : I just feel tired all the time.  HPI: Mrs. Winebarger comes back for a long-awaited follow-up. We saw her back in October 2014. At that point in time, she had multi-factorial anemia.  Nonstress Y she was lost to follow-up. She has not had any Aranesp for probably 3 years.  She has just felt more tired. She had lab work done in August. This showed a hemoglobin of 10.4 and hematocrit 31.3. Her white cell counts 4.1. Her platelet count which 149,000. Her MCV was 95. She had mild renal sufficiency with a BUN of 26 and creatinine of 1.5.  Back when we first we saw her back in 2014, we did a SPEP on her. Her M spike was 0.4 g/dL. Her IgA level was 661 mg/dL.  She just feels tired. She was told to come back to see [place] again.  She's had no obvious changes in her medications. I think she says she was put on a new cholesterol medicine a couple weeks ago. She is on quite a few medications.  She is not diabetic.  She has not had any surgery since we last saw her.  Her last mammogram was back in 2014. This appeared to be okay.  She is not a vegetarian. She has had no weight loss or weight gain. She has had no mouth sores. His been no obvious change in bowel or bladder habits.  Overall, her performance status is ECOG 1.    Past Medical History:  Diagnosis Date  . Anemia    Associated with leukopenia and lymphocytosis. This is been evaluated by Dr. Marin Olp . Her platelet count has been normal  . Arthritis   . Chronic back pain   . Chronic kidney disease   . GERD (gastroesophageal reflux disease)   . Hypertension   . Lymphocytosis   . MGUS (monoclonal gammopathy of unknown significance) 08/03/2012   IgA 792 11/04/11  . Pneumonia 04/2010   OP  . Renal insufficiency    . Sarcoidosis ([place])   . Thyroid disease   :  Past Surgical History:  Procedure Laterality Date  . CARPAL TUNNEL RELEASE     Bilateral   . CATARACT EXTRACTION Right 05/2015  . CERVICAL DISCECTOMY  06/2010   Dr.Pool  . CHOLECYSTECTOMY    . COLONOSCOPY  2010  . POSTERIOR LUMBAR FUSION  02/29/2012   Dr Alyson Locket op respiratory & renal compromise  . THYROIDECTOMY  2003   For goiter  :   Current Outpatient Prescriptions:  .  aspirin 81 MG tablet, Take 81 mg by mouth daily. , Disp: , Rfl:  .  atorvastatin (LIPITOR) 20 MG tablet, Take 1 tablet (20 mg total) by mouth daily., Disp: 30 tablet, Rfl: 3 .  budesonide-formoterol (SYMBICORT) 160-4.5 MCG/ACT inhaler, Inhale 2 puffs into the lungs as needed. For wheezing, Disp: 1 Inhaler, Rfl: 5 .  carvedilol (COREG) 25 MG tablet, Take 1 tablet (25 mg total) by mouth  2 (two) times daily., Disp: 180 tablet, Rfl: 2 .  cyclobenzaprine (FLEXERIL) 10 MG tablet, TAKE 1/2 TO 1 TABLET BY MOUTH AT BEDTIME IF NEEDED DO NOT TAKE WITH AMBIEN OR OTHER SLEEP AID, Disp: 10 tablet, Rfl: 0 .  gabapentin (NEURONTIN) 300 MG capsule, take 1 capsule by mouth every 8 hours if needed, Disp: 90 capsule, Rfl: 3 .  hydrALAZINE (APRESOLINE) 50 MG tablet, Take 1 tablet (50 mg total) by mouth 3 (three) times daily., Disp: 270 tablet, Rfl: 3 .  leflunomide (ARAVA) 20 MG tablet, Take 20 mg by mouth daily., Disp: , Rfl:  .  levothyroxine (SYNTHROID, LEVOTHROID) 150 MCG tablet, take 1 tablet by mouth once daily EXCEPT 1/2 TABLET ON WEDNESDAYS, Disp: 90 tablet, Rfl: 3 .  losartan (COZAAR) 100 MG tablet, Take 1 tablet (100 mg total) by mouth daily., Disp: 90 tablet, Rfl: 2 .  Multiple Vitamin (MULTIVITAMIN WITH MINERALS) TABS, Take 1 tablet by mouth daily., Disp: , Rfl:  .  ranitidine (ZANTAC) 150 MG tablet, Take 1 tablet (150 mg total) by mouth 2 (two) times daily., Disp: 180 tablet, Rfl: 2 .  spironolactone (ALDACTONE) 50 MG tablet, Take 0.5 tablets (25 mg total) by mouth daily.,  Disp: 90 tablet, Rfl: 3 .  traMADol (ULTRAM) 50 MG tablet, Take 50 mg by mouth every 6 (six) hours as needed., Disp: , Rfl:  .  vitamin E 400 UNIT capsule, Take 400 Units by mouth daily. , Disp: , Rfl:  .  zolpidem (AMBIEN) 10 MG tablet, Take 1 tablet (10 mg total) by mouth at bedtime as needed for sleep., Disp: 90 tablet, Rfl: 3:  :  Allergies  Allergen Reactions  . Penicillins     Intolerance or allergy not remembered; her sister stated penicillin caused "boils under the skin"  . Shellfish Allergy Rash  . Sulfonamide Derivatives     Arthralgias; her sister stated that this caused a rash.  . Azithromycin     itching  . Codeine     Nausea only with liquid codeine  . Infliximab     Other reaction(s): Confusion (intolerance)  :  Family History  Problem Relation Age of Onset  . Anemia Father   . Arthritis Father   . Heart failure Father 70    No CAD  . Diabetes Mother   . Arthritis Mother   . Glaucoma Mother   . Heart disease Mother 29    No CAD  . Hypertension Brother   . Anemia Brother   . Kidney disease Brother   . Hypertension Sister   . Kidney disease Sister   . Anemia Sister   . Diabetes Maternal Aunt   . Arthritis Paternal Grandmother   :  Social History   Social History  . Marital status: Legally Separated    Spouse name: N/A  . Number of children: 0  . Years of education: 12   Occupational History  . Retired Retired   Social History Main Topics  . Smoking status: Former Smoker    Packs/day: 0.50    Years: 20.00    Types: Cigarettes    Quit date: 05/17/1997  . Smokeless tobacco: Never Used  . Alcohol use 0.6 oz/week    1 Glasses of wine per week     Comment: occasionally   . Drug use: No  . Sexual activity: Not on file   Other Topics Concern  . Not on file   Social History Narrative   Patient is divorced and lives with  her sister. Patient is retired.   Caffeine- sometimes - one cup   Right handed.  :  Pertinent items are noted in  HPI.  Exam: @IPVITALS @ Well-developed and well-nourished Afro-American female in no obvious distress. Vital signs show temperature of 98.3. Pulse 83. Blood pressure 145/64. Weight is 202 pounds. Head and neck exam shows no ocular or oral lesions. There are no palpable cervical or supraclavicular lymph nodes. Lungs are clear bilaterally. Cardiac exam regular rate and rhythm with no murmurs, rubs or bruits. Abdomen is soft. She has good bowel sounds. She is mildly obese. There is no palpable liver or spleen tip. Extremities shows some chronic 1+ edema in her lower legs. She has good range of motion of her joints. Skin exam shows no rashes, ecchymoses or petechia. Neurological exam shows no focal neurological deficits.    Recent Labs  02/11/16 1451  WBC 4.1  HGB 9.0*  HCT 27.2*  PLT 205   No results for input(s): NA, K, CL, CO2, GLUCOSE, BUN, CREATININE, CALCIUM in the last 72 hours.  Blood smear review:  Normochromic and normocytic population of red blood cells. I do not see any nucleated red blood cells. There are no teardrop cells. I see no target cells. White cells are normal morphology maturation. I see no hyper segmented polys. There is no immature myeloid or lymphoid forms. Platelets are adequate in number and size.  Pathology: None     Assessment and Plan:  Ms. micale is a very nice 70 year old African-American female. She has anemia. This appears to be a low bit more prominent than when we last saw her.  We will see what her iron studies show. We will see what her erythropoietin level is. I would not think this is anything related to her MGUS. We are checking this.  I will I do think that once she gets Aranesp, that she will start have improvement of her anemia. Again, we'll have see what her iron studies show.  It was nice to see her again. I do not see that we have to do a bone marrow test on her. I would really think this would give [place] any additional information.  We will call  her back once we get the results I follow all of her labs and inferior out how we can improve her blood count so that she will feel better.

## 2016-02-12 LAB — LACTATE DEHYDROGENASE: LDH: 210 U/L (ref 125–245)

## 2016-02-12 LAB — RETICULOCYTES: Reticulocyte Count: 1.6 % (ref 0.6–2.6)

## 2016-02-12 LAB — ERYTHROPOIETIN: Erythropoietin: 27.4 m[IU]/mL — ABNORMAL HIGH (ref 2.6–18.5)

## 2016-02-12 LAB — IGG, IGA, IGM
IgA, Qn, Serum: 473 mg/dL — ABNORMAL HIGH (ref 87–352)
IgG, Qn, Serum: 891 mg/dL (ref 700–1600)
IgM, Qn, Serum: 29 mg/dL (ref 26–217)

## 2016-02-12 LAB — IRON AND TIBC
%SAT: 22 % (ref 21–57)
Iron: 64 ug/dL (ref 41–142)
TIBC: 288 ug/dL (ref 236–444)
UIBC: 224 ug/dL (ref 120–384)

## 2016-02-12 LAB — KAPPA/LAMBDA LIGHT CHAINS
Ig Kappa Free Light Chain: 18.3 mg/L (ref 3.3–19.4)
Ig Lambda Free Light Chain: 35.9 mg/L — ABNORMAL HIGH (ref 5.7–26.3)
Kappa/Lambda FluidC Ratio: 0.51 (ref 0.26–1.65)

## 2016-02-12 LAB — BETA 2 MICROGLOBULIN, SERUM: Beta-2: 2.8 mg/L — ABNORMAL HIGH (ref 0.6–2.4)

## 2016-02-12 LAB — FERRITIN: Ferritin: 136 ng/ml (ref 9–269)

## 2016-02-16 LAB — PROTEIN ELECTROPHORESIS, SERUM, WITH REFLEX
A/G Ratio: 1.4 (ref 0.7–1.7)
Albumin: 4 g/dL (ref 2.9–4.4)
Alpha 1: 0.2 g/dL (ref 0.0–0.4)
Alpha 2: 0.6 g/dL (ref 0.4–1.0)
Beta: 1.2 g/dL (ref 0.7–1.3)
Gamma Globulin: 0.8 g/dL (ref 0.4–1.8)
Globulin, Total: 2.8 g/dL (ref 2.2–3.9)
Interpretation(See Below): 0
M-Spike, %: 0.2 g/dL — ABNORMAL HIGH
PDF: 0
Total Protein: 6.8 g/dL (ref 6.0–8.5)

## 2016-02-23 ENCOUNTER — Encounter: Payer: Self-pay | Admitting: Hematology & Oncology

## 2016-02-23 ENCOUNTER — Telehealth: Payer: Self-pay | Admitting: Hematology & Oncology

## 2016-02-23 DIAGNOSIS — D631 Anemia in chronic kidney disease: Secondary | ICD-10-CM

## 2016-02-23 HISTORY — DX: Anemia in chronic kidney disease: D63.1

## 2016-02-23 NOTE — Telephone Encounter (Signed)
I left a phone message for Ms. Doerner.  I told her about the results from her lab work. I said that she looks like she has a low erythropoietin level. As such, we need to set her up with injections for Aranesp. These will hopefully help get her blood count up. I don't think that her iron is low right now.  I think that this will help get her blood count better so she will feel better.  We will do the injections every 2 weeks for right now once we get her blood count better, then we will be able to move her appointment setup  I told her that my scheduler will call her.  If she has any questions, she can always call us.  Laurey Arrow

## 2016-02-23 NOTE — Addendum Note (Signed)
Addended by: Burney Gauze R on: 02/23/2016 05:34 PM   Modules accepted: Orders

## 2016-03-01 DIAGNOSIS — H11433 Conjunctival hyperemia, bilateral: Secondary | ICD-10-CM | POA: Diagnosis not present

## 2016-03-01 DIAGNOSIS — H52223 Regular astigmatism, bilateral: Secondary | ICD-10-CM | POA: Diagnosis not present

## 2016-03-01 DIAGNOSIS — H524 Presbyopia: Secondary | ICD-10-CM | POA: Diagnosis not present

## 2016-03-01 DIAGNOSIS — H5203 Hypermetropia, bilateral: Secondary | ICD-10-CM | POA: Diagnosis not present

## 2016-03-01 DIAGNOSIS — H04123 Dry eye syndrome of bilateral lacrimal glands: Secondary | ICD-10-CM | POA: Diagnosis not present

## 2016-03-08 ENCOUNTER — Other Ambulatory Visit: Payer: Medicare Other

## 2016-03-08 ENCOUNTER — Ambulatory Visit: Payer: Medicare Other

## 2016-03-10 ENCOUNTER — Ambulatory Visit (HOSPITAL_BASED_OUTPATIENT_CLINIC_OR_DEPARTMENT_OTHER): Payer: Medicare Other

## 2016-03-10 ENCOUNTER — Other Ambulatory Visit (HOSPITAL_BASED_OUTPATIENT_CLINIC_OR_DEPARTMENT_OTHER): Payer: Medicare Other

## 2016-03-10 VITALS — BP 134/61 | HR 94 | Temp 98.2°F | Resp 16

## 2016-03-10 DIAGNOSIS — N289 Disorder of kidney and ureter, unspecified: Secondary | ICD-10-CM

## 2016-03-10 DIAGNOSIS — N182 Chronic kidney disease, stage 2 (mild): Secondary | ICD-10-CM

## 2016-03-10 DIAGNOSIS — D472 Monoclonal gammopathy: Secondary | ICD-10-CM | POA: Diagnosis present

## 2016-03-10 DIAGNOSIS — D631 Anemia in chronic kidney disease: Secondary | ICD-10-CM

## 2016-03-10 DIAGNOSIS — N189 Chronic kidney disease, unspecified: Secondary | ICD-10-CM

## 2016-03-10 LAB — CBC WITH DIFFERENTIAL (CANCER CENTER ONLY)
BASO#: 0.1 10*3/uL (ref 0.0–0.2)
BASO%: 1 % (ref 0.0–2.0)
EOS%: 6.4 % (ref 0.0–7.0)
Eosinophils Absolute: 0.3 10*3/uL (ref 0.0–0.5)
HCT: 29.1 % — ABNORMAL LOW (ref 34.8–46.6)
HGB: 9.5 g/dL — ABNORMAL LOW (ref 11.6–15.9)
LYMPH#: 2.7 10*3/uL (ref 0.9–3.3)
LYMPH%: 55.9 % — ABNORMAL HIGH (ref 14.0–48.0)
MCH: 32.2 pg (ref 26.0–34.0)
MCHC: 32.6 g/dL (ref 32.0–36.0)
MCV: 99 fL (ref 81–101)
MONO#: 0.6 10*3/uL (ref 0.1–0.9)
MONO%: 12.8 % (ref 0.0–13.0)
NEUT#: 1.2 10*3/uL — ABNORMAL LOW (ref 1.5–6.5)
NEUT%: 23.9 % — ABNORMAL LOW (ref 39.6–80.0)
Platelets: 194 10*3/uL (ref 145–400)
RBC: 2.95 10*6/uL — ABNORMAL LOW (ref 3.70–5.32)
RDW: 13 % (ref 11.1–15.7)
WBC: 4.9 10*3/uL (ref 3.9–10.0)

## 2016-03-10 MED ORDER — DARBEPOETIN ALFA 300 MCG/0.6ML IJ SOSY
300.0000 ug | PREFILLED_SYRINGE | Freq: Once | INTRAMUSCULAR | Status: AC
  Administered 2016-03-10: 300 ug via SUBCUTANEOUS

## 2016-03-10 MED ORDER — DARBEPOETIN ALFA 300 MCG/0.6ML IJ SOSY
PREFILLED_SYRINGE | INTRAMUSCULAR | Status: AC
  Filled 2016-03-10: qty 0.6

## 2016-03-10 NOTE — Patient Instructions (Signed)
Darbepoetin Alfa injection What is this medicine? DARBEPOETIN ALFA (dar be POE e tin AL fa) helps your body make more red blood cells. It is used to treat anemia caused by chronic kidney failure and chemotherapy. This medicine may be used for other purposes; ask your health care provider or pharmacist if you have questions. What should I tell my health care provider before I take this medicine? They need to know if you have any of these conditions: -blood clotting disorders or history of blood clots -cancer patient not on chemotherapy -cystic fibrosis -heart disease, such as angina, heart failure, or a history of a heart attack -hemoglobin level of 12 g/dL or greater -high blood pressure -low levels of folate, iron, or vitamin B12 -seizures -an unusual or allergic reaction to darbepoetin, erythropoietin, albumin, hamster proteins, latex, other medicines, foods, dyes, or preservatives -pregnant or trying to get pregnant -breast-feeding How should I use this medicine? This medicine is for injection into a vein or under the skin. It is usually given by a health care professional in a hospital or clinic setting. If you get this medicine at home, you will be taught how to prepare and give this medicine. Do not shake the solution before you withdraw a dose. Use exactly as directed. Take your medicine at regular intervals. Do not take your medicine more often than directed. It is important that you put your used needles and syringes in a special sharps container. Do not put them in a trash can. If you do not have a sharps container, call your pharmacist or healthcare provider to get one. Talk to your pediatrician regarding the use of this medicine in children. While this medicine may be used in children as young as 1 year for selected conditions, precautions do apply. Overdosage: If you think you have taken too much of this medicine contact a poison control center or emergency room at once. NOTE:  This medicine is only for you. Do not share this medicine with others. What if I miss a dose? If you miss a dose, take it as soon as you can. If it is almost time for your next dose, take only that dose. Do not take double or extra doses. What may interact with this medicine? Do not take this medicine with any of the following medications: -epoetin alfa This list may not describe all possible interactions. Give your health care provider a list of all the medicines, herbs, non-prescription drugs, or dietary supplements you use. Also tell them if you smoke, drink alcohol, or use illegal drugs. Some items may interact with your medicine. What should I watch for while using this medicine? Visit your prescriber or health care professional for regular checks on your progress and for the needed blood tests and blood pressure measurements. It is especially important for the doctor to make sure your hemoglobin level is in the desired range, to limit the risk of potential side effects and to give you the best benefit. Keep all appointments for any recommended tests. Check your blood pressure as directed. Ask your doctor what your blood pressure should be and when you should contact him or her. As your body makes more red blood cells, you may need to take iron, folic acid, or vitamin B supplements. Ask your doctor or health care provider which products are right for you. If you have kidney disease continue dietary restrictions, even though this medication can make you feel better. Talk with your doctor or health care professional about the   foods you eat and the vitamins that you take. What side effects may I notice from receiving this medicine? Side effects that you should report to your doctor or health care professional as soon as possible: -allergic reactions like skin rash, itching or hives, swelling of the face, lips, or tongue -breathing problems -changes in vision -chest pain -confusion, trouble speaking  or understanding -feeling faint or lightheaded, falls -high blood pressure -muscle aches or pains -pain, swelling, warmth in the leg -rapid weight gain -severe headaches -sudden numbness or weakness of the face, arm or leg -trouble walking, dizziness, loss of balance or coordination -seizures (convulsions) -swelling of the ankles, feet, hands -unusually weak or tired Side effects that usually do not require medical attention (report to your doctor or health care professional if they continue or are bothersome): -diarrhea -fever, chills (flu-like symptoms) -headaches -nausea, vomiting -redness, stinging, or swelling at site where injected This list may not describe all possible side effects. Call your doctor for medical advice about side effects. You may report side effects to FDA at 1-800-FDA-1088. Where should I keep my medicine? Keep out of the reach of children. Store in a refrigerator between 2 and 8 degrees C (36 and 46 degrees F). Do not freeze. Do not shake. Throw away any unused portion if using a single-dose vial. Throw away any unused medicine after the expiration date. NOTE: This sheet is a summary. It may not cover all possible information. If you have questions about this medicine, talk to your doctor, pharmacist, or health care provider.    2016, Elsevier/Gold Standard. (2008-04-16 10:23:57)  

## 2016-03-16 ENCOUNTER — Encounter: Payer: Self-pay | Admitting: Hematology & Oncology

## 2016-03-22 ENCOUNTER — Other Ambulatory Visit (HOSPITAL_BASED_OUTPATIENT_CLINIC_OR_DEPARTMENT_OTHER): Payer: Medicare Other

## 2016-03-22 ENCOUNTER — Ambulatory Visit (HOSPITAL_BASED_OUTPATIENT_CLINIC_OR_DEPARTMENT_OTHER): Payer: Medicare Other

## 2016-03-22 VITALS — BP 147/62 | HR 82 | Temp 98.0°F | Resp 16

## 2016-03-22 DIAGNOSIS — D631 Anemia in chronic kidney disease: Secondary | ICD-10-CM

## 2016-03-22 DIAGNOSIS — N189 Chronic kidney disease, unspecified: Secondary | ICD-10-CM

## 2016-03-22 DIAGNOSIS — N182 Chronic kidney disease, stage 2 (mild): Secondary | ICD-10-CM

## 2016-03-22 DIAGNOSIS — N289 Disorder of kidney and ureter, unspecified: Secondary | ICD-10-CM

## 2016-03-22 LAB — CBC WITH DIFFERENTIAL (CANCER CENTER ONLY)
BASO#: 0 10*3/uL (ref 0.0–0.2)
BASO%: 0.7 % (ref 0.0–2.0)
EOS%: 6.4 % (ref 0.0–7.0)
Eosinophils Absolute: 0.3 10*3/uL (ref 0.0–0.5)
HCT: 32.9 % — ABNORMAL LOW (ref 34.8–46.6)
HGB: 10.9 g/dL — ABNORMAL LOW (ref 11.6–15.9)
LYMPH#: 1.9 10*3/uL (ref 0.9–3.3)
LYMPH%: 46.9 % (ref 14.0–48.0)
MCH: 32.7 pg (ref 26.0–34.0)
MCHC: 33.1 g/dL (ref 32.0–36.0)
MCV: 99 fL (ref 81–101)
MONO#: 0.6 10*3/uL (ref 0.1–0.9)
MONO%: 15.5 % — ABNORMAL HIGH (ref 0.0–13.0)
NEUT#: 1.2 10*3/uL — ABNORMAL LOW (ref 1.5–6.5)
NEUT%: 30.5 % — ABNORMAL LOW (ref 39.6–80.0)
Platelets: 190 10*3/uL (ref 145–400)
RBC: 3.33 10*6/uL — ABNORMAL LOW (ref 3.70–5.32)
RDW: 15.4 % (ref 11.1–15.7)
WBC: 4.1 10*3/uL (ref 3.9–10.0)

## 2016-03-22 MED ORDER — DARBEPOETIN ALFA 300 MCG/0.6ML IJ SOSY
PREFILLED_SYRINGE | INTRAMUSCULAR | Status: AC
  Filled 2016-03-22: qty 0.6

## 2016-03-22 MED ORDER — DARBEPOETIN ALFA 300 MCG/0.6ML IJ SOSY
300.0000 ug | PREFILLED_SYRINGE | Freq: Once | INTRAMUSCULAR | Status: AC
  Administered 2016-03-22: 300 ug via SUBCUTANEOUS

## 2016-03-22 NOTE — Patient Instructions (Signed)
Darbepoetin Alfa injection What is this medicine? DARBEPOETIN ALFA (dar be POE e tin AL fa) helps your body make more red blood cells. It is used to treat anemia caused by chronic kidney failure and chemotherapy. This medicine may be used for other purposes; ask your health care provider or pharmacist if you have questions. What should I tell my health care provider before I take this medicine? They need to know if you have any of these conditions: -blood clotting disorders or history of blood clots -cancer patient not on chemotherapy -cystic fibrosis -heart disease, such as angina, heart failure, or a history of a heart attack -hemoglobin level of 12 g/dL or greater -high blood pressure -low levels of folate, iron, or vitamin B12 -seizures -an unusual or allergic reaction to darbepoetin, erythropoietin, albumin, hamster proteins, latex, other medicines, foods, dyes, or preservatives -pregnant or trying to get pregnant -breast-feeding How should I use this medicine? This medicine is for injection into a vein or under the skin. It is usually given by a health care professional in a hospital or clinic setting. If you get this medicine at home, you will be taught how to prepare and give this medicine. Do not shake the solution before you withdraw a dose. Use exactly as directed. Take your medicine at regular intervals. Do not take your medicine more often than directed. It is important that you put your used needles and syringes in a special sharps container. Do not put them in a trash can. If you do not have a sharps container, call your pharmacist or healthcare provider to get one. Talk to your pediatrician regarding the use of this medicine in children. While this medicine may be used in children as young as 1 year for selected conditions, precautions do apply. Overdosage: If you think you have taken too much of this medicine contact a poison control center or emergency room at once. NOTE:  This medicine is only for you. Do not share this medicine with others. What if I miss a dose? If you miss a dose, take it as soon as you can. If it is almost time for your next dose, take only that dose. Do not take double or extra doses. What may interact with this medicine? Do not take this medicine with any of the following medications: -epoetin alfa This list may not describe all possible interactions. Give your health care provider a list of all the medicines, herbs, non-prescription drugs, or dietary supplements you use. Also tell them if you smoke, drink alcohol, or use illegal drugs. Some items may interact with your medicine. What should I watch for while using this medicine? Visit your prescriber or health care professional for regular checks on your progress and for the needed blood tests and blood pressure measurements. It is especially important for the doctor to make sure your hemoglobin level is in the desired range, to limit the risk of potential side effects and to give you the best benefit. Keep all appointments for any recommended tests. Check your blood pressure as directed. Ask your doctor what your blood pressure should be and when you should contact him or her. As your body makes more red blood cells, you may need to take iron, folic acid, or vitamin B supplements. Ask your doctor or health care provider which products are right for you. If you have kidney disease continue dietary restrictions, even though this medication can make you feel better. Talk with your doctor or health care professional about the   foods you eat and the vitamins that you take. What side effects may I notice from receiving this medicine? Side effects that you should report to your doctor or health care professional as soon as possible: -allergic reactions like skin rash, itching or hives, swelling of the face, lips, or tongue -breathing problems -changes in vision -chest pain -confusion, trouble speaking  or understanding -feeling faint or lightheaded, falls -high blood pressure -muscle aches or pains -pain, swelling, warmth in the leg -rapid weight gain -severe headaches -sudden numbness or weakness of the face, arm or leg -trouble walking, dizziness, loss of balance or coordination -seizures (convulsions) -swelling of the ankles, feet, hands -unusually weak or tired Side effects that usually do not require medical attention (report to your doctor or health care professional if they continue or are bothersome): -diarrhea -fever, chills (flu-like symptoms) -headaches -nausea, vomiting -redness, stinging, or swelling at site where injected This list may not describe all possible side effects. Call your doctor for medical advice about side effects. You may report side effects to FDA at 1-800-FDA-1088. Where should I keep my medicine? Keep out of the reach of children. Store in a refrigerator between 2 and 8 degrees C (36 and 46 degrees F). Do not freeze. Do not shake. Throw away any unused portion if using a single-dose vial. Throw away any unused medicine after the expiration date. NOTE: This sheet is a summary. It may not cover all possible information. If you have questions about this medicine, talk to your doctor, pharmacist, or health care provider.    2016, Elsevier/Gold Standard. (2008-04-16 10:23:57)  

## 2016-04-05 ENCOUNTER — Other Ambulatory Visit: Payer: Medicare Other

## 2016-04-05 ENCOUNTER — Ambulatory Visit (HOSPITAL_BASED_OUTPATIENT_CLINIC_OR_DEPARTMENT_OTHER): Payer: Medicare Other | Admitting: Family

## 2016-04-05 ENCOUNTER — Other Ambulatory Visit (HOSPITAL_BASED_OUTPATIENT_CLINIC_OR_DEPARTMENT_OTHER): Payer: Medicare Other

## 2016-04-05 ENCOUNTER — Ambulatory Visit (HOSPITAL_BASED_OUTPATIENT_CLINIC_OR_DEPARTMENT_OTHER): Payer: Medicare Other

## 2016-04-05 VITALS — BP 144/67 | HR 86 | Temp 98.2°F | Resp 20 | Wt 204.0 lb

## 2016-04-05 DIAGNOSIS — N182 Chronic kidney disease, stage 2 (mild): Secondary | ICD-10-CM

## 2016-04-05 DIAGNOSIS — Z23 Encounter for immunization: Secondary | ICD-10-CM

## 2016-04-05 DIAGNOSIS — D631 Anemia in chronic kidney disease: Secondary | ICD-10-CM | POA: Diagnosis not present

## 2016-04-05 DIAGNOSIS — D509 Iron deficiency anemia, unspecified: Secondary | ICD-10-CM

## 2016-04-05 DIAGNOSIS — N189 Chronic kidney disease, unspecified: Secondary | ICD-10-CM

## 2016-04-05 DIAGNOSIS — N289 Disorder of kidney and ureter, unspecified: Secondary | ICD-10-CM

## 2016-04-05 DIAGNOSIS — D472 Monoclonal gammopathy: Secondary | ICD-10-CM

## 2016-04-05 LAB — CBC WITH DIFFERENTIAL (CANCER CENTER ONLY)
BASO#: 0 10*3/uL (ref 0.0–0.2)
BASO%: 1.1 % (ref 0.0–2.0)
EOS%: 5.8 % (ref 0.0–7.0)
Eosinophils Absolute: 0.2 10*3/uL (ref 0.0–0.5)
HCT: 38.5 % (ref 34.8–46.6)
HGB: 12.5 g/dL (ref 11.6–15.9)
LYMPH#: 1.8 10*3/uL (ref 0.9–3.3)
LYMPH%: 46.2 % (ref 14.0–48.0)
MCH: 32.3 pg (ref 26.0–34.0)
MCHC: 32.5 g/dL (ref 32.0–36.0)
MCV: 100 fL (ref 81–101)
MONO#: 0.5 10*3/uL (ref 0.1–0.9)
MONO%: 12.4 % (ref 0.0–13.0)
NEUT#: 1.3 10*3/uL — ABNORMAL LOW (ref 1.5–6.5)
NEUT%: 34.5 % — ABNORMAL LOW (ref 39.6–80.0)
Platelets: 202 10*3/uL (ref 145–400)
RBC: 3.87 10*6/uL (ref 3.70–5.32)
RDW: 15 % (ref 11.1–15.7)
WBC: 3.8 10*3/uL — ABNORMAL LOW (ref 3.9–10.0)

## 2016-04-05 LAB — COMPREHENSIVE METABOLIC PANEL
ALT: 22 U/L (ref 0–55)
AST: 21 U/L (ref 5–34)
Albumin: 3.6 g/dL (ref 3.5–5.0)
Alkaline Phosphatase: 77 U/L (ref 40–150)
Anion Gap: 11 mEq/L (ref 3–11)
BUN: 18.5 mg/dL (ref 7.0–26.0)
CO2: 21 mEq/L — ABNORMAL LOW (ref 22–29)
Calcium: 9.3 mg/dL (ref 8.4–10.4)
Chloride: 111 mEq/L — ABNORMAL HIGH (ref 98–109)
Creatinine: 1.5 mg/dL — ABNORMAL HIGH (ref 0.6–1.1)
EGFR: 40 mL/min/{1.73_m2} — ABNORMAL LOW (ref >90)
Glucose: 99 mg/dl (ref 70–140)
Potassium: 3.8 mEq/L (ref 3.5–5.1)
Sodium: 143 mEq/L (ref 136–145)
Total Bilirubin: 0.36 mg/dL (ref 0.20–1.20)
Total Protein: 7.7 g/dL (ref 6.4–8.3)

## 2016-04-05 MED ORDER — INFLUENZA VAC SPLIT QUAD 0.5 ML IM SUSY
0.5000 mL | PREFILLED_SYRINGE | Freq: Once | INTRAMUSCULAR | Status: AC
  Administered 2016-04-05: 0.5 mL via INTRAMUSCULAR
  Filled 2016-04-05: qty 0.5

## 2016-04-05 NOTE — Progress Notes (Signed)
Hematology and Oncology Follow Up Visit  Tina Molina [id number] [birth year] 70 y.o. 04/05/2016   Principle Diagnosis:  Anemia of chronic renal failure - stage II MGUS  Current Therapy:   Aranesp 300 mcg SQ to keep Hgb > 10    Interim History:  Tina Molina is here today for a follow-up. She has responded nicely to Aranesp. Her Hgb is now up to 12.5 with an MCV of 100. She is still having some fatigue but can tell this has improved.  M-spike in September was stable at 0.4 g/dL and IgA was 473 mg/dL. We will continue to check her protein studies every 6 months.  No fever, chills, n/v, cough, rash, dizziness, SOB, chest pain, palpitations, abdominal pain or changes in bowel or bladder habits.  No swelling or tenderness in her extremities. She has numbness and tingling in her hands and feet due to chronic back issues.  She has maintained a good appetite and is staying well hydrated. Her weight is stable.   Medications:    Medication List       Accurate as of 04/05/16 12:55 PM. Always use your most recent med list.          aspirin 81 MG tablet Take 81 mg by mouth daily.   atorvastatin 20 MG tablet Commonly known as:  LIPITOR Take 1 tablet (20 mg total) by mouth daily.   budesonide-formoterol 160-4.5 MCG/ACT inhaler Commonly known as:  SYMBICORT Inhale 2 puffs into the lungs as needed. For wheezing   carvedilol 25 MG tablet Commonly known as:  COREG Take 1 tablet (25 mg total) by mouth 2 (two) times daily.   cyclobenzaprine 10 MG tablet Commonly known as:  FLEXERIL TAKE 1/2 TO 1 TABLET BY MOUTH AT BEDTIME IF NEEDED DO NOT TAKE WITH AMBIEN OR OTHER SLEEP AID   gabapentin 300 MG capsule Commonly known as:  NEURONTIN take 1 capsule by mouth every 8 hours if needed   hydrALAZINE 50 MG tablet Commonly known as:  APRESOLINE Take 1 tablet (50 mg total) by mouth 3 (three) times daily.   leflunomide 20 MG tablet Commonly known as:  ARAVA Take 20 mg by mouth daily.     levothyroxine 150 MCG tablet Commonly known as:  SYNTHROID, LEVOTHROID take 1 tablet by mouth once daily EXCEPT 1/2 TABLET ON WEDNESDAYS   losartan 100 MG tablet Commonly known as:  COZAAR Take 1 tablet (100 mg total) by mouth daily.   multivitamin with minerals Tabs tablet Take 1 tablet by mouth daily.   ranitidine 150 MG tablet Commonly known as:  ZANTAC Take 1 tablet (150 mg total) by mouth 2 (two) times daily.   spironolactone 50 MG tablet Commonly known as:  ALDACTONE Take 0.5 tablets (25 mg total) by mouth daily.   traMADol 50 MG tablet Commonly known as:  ULTRAM Take 50 mg by mouth every 6 (six) hours as needed.   vitamin E 400 UNIT capsule Take 400 Units by mouth daily.   zolpidem 10 MG tablet Commonly known as:  AMBIEN Take 1 tablet (10 mg total) by mouth at bedtime as needed for sleep.       Allergies:  Allergies  Allergen Reactions  . Penicillins     Intolerance or allergy not remembered; her sister stated penicillin caused "boils under the skin"  . Shellfish Allergy Rash  . Sulfonamide Derivatives     Arthralgias; her sister stated that this caused a rash.  . Azithromycin     itching  .  Codeine     Nausea only with liquid codeine  . Infliximab     Other reaction(s): Confusion (intolerance)    Past Medical History, Surgical history, Social history, and Family History were reviewed and updated.  Review of Systems: All other 10 point review of systems is negative.   Physical Exam:  weight is 204 lb (92.5 kg). Her oral temperature is 98.2 F (36.8 C). Her blood pressure is 144/67 (abnormal) and her pulse is 86. Her respiration is 20.   Wt Readings from Last 3 Encounters:  04/05/16 204 lb (92.5 kg)  02/11/16 202 lb 6.4 oz (91.8 kg)  12/31/15 200 lb (90.7 kg)    Ocular: Sclerae unicteric, pupils equal, round and reactive to light Ear-nose-throat: Oropharynx clear, dentition fair Lymphatic: No cervical, supraclavicular or axillary  adenopathy Lungs no rales or rhonchi, good excursion bilaterally Heart regular rate and rhythm, no murmur appreciated Abd soft, nontender, positive bowel sounds, no liver or spleen tip palpated on exam, no fluid wave MSK no focal spinal tenderness, no joint edema Neuro: non-focal, well-oriented, appropriate affect Breasts: Deferred  Lab Results  Component Value Date   WBC 3.8 (L) 04/05/2016   HGB 12.5 04/05/2016   HCT 38.5 04/05/2016   MCV 100 04/05/2016   PLT 202 04/05/2016   Lab Results  Component Value Date   FERRITIN 136 02/11/2016   IRON 64 02/11/2016   TIBC 288 02/11/2016   UIBC 224 02/11/2016   IRONPCTSAT 22 02/11/2016   Lab Results  Component Value Date   RETICCTPCT 1.5 01/03/2013   RBC 3.87 04/05/2016   RETICCTABS 51.6 01/03/2013   Lab Results  Component Value Date   KPAFRELGTCHN 1.97 (H) 01/03/2013   LAMBDASER 5.55 (H) 01/03/2013   KAPLAMBRATIO 0.51 02/11/2016   Lab Results  Component Value Date   IGGSERUM 891 02/11/2016   IGA 661 (H) 01/03/2013   IGMSERUM 29 02/11/2016   Lab Results  Component Value Date   TOTALPROTELP 7.2 01/03/2013   ALBUMINELP 57.9 11/04/2011   A1GS 3.1 11/04/2011   A2GS 8.9 11/04/2011   BETS 5.9 11/04/2011   BETA2SER 10.3 (H) 11/04/2011   GAMS 13.9 11/04/2011   MSPIKE 0.2 (H) 02/11/2016   SPEI * 11/04/2011     Chemistry      Component Value Date/Time   NA 134 02/11/2016 1451   K 3.7 02/11/2016 1451   CL 107 (H) 02/11/2016 1451   CO2 23 02/11/2016 1451   BUN 25 02/11/2016 1451   CREATININE 1.58 (H) 02/11/2016 1451   CREATININE 1.28 (H) 08/04/2012 1030      Component Value Date/Time   CALCIUM 8.2 (L) 02/11/2016 1451   ALKPHOS 63 02/11/2016 1451   AST 18 02/11/2016 1451   ALT 19 02/11/2016 1451   BILITOT 0.2 02/11/2016 1451     Impression and Plan: Tina Molina is a very nice 70 yo African-American female with anemia of chronic renal disease and MGUS.  She has responded nicely to the Aranesp she received and her Hgb  is now 12.5. She can tell that her fatigue is starting to improved. She will not need an injection today.  We will recheck her protein studies at her next visit. Iron studies are pending.  We will plan to see her back in 3 months for repeat lab work and follow-up.  She will contact our office with any questions or concerns. We can certainly see her sooner if need be.   Eliezer Bottom, NP 11/20/201712:55 PM

## 2016-04-05 NOTE — Patient Instructions (Signed)

## 2016-04-06 ENCOUNTER — Other Ambulatory Visit: Payer: Self-pay | Admitting: Family

## 2016-04-06 DIAGNOSIS — D509 Iron deficiency anemia, unspecified: Secondary | ICD-10-CM | POA: Insufficient documentation

## 2016-04-06 DIAGNOSIS — D508 Other iron deficiency anemias: Secondary | ICD-10-CM

## 2016-04-06 LAB — FERRITIN: Ferritin: 41 ng/ml (ref 9–269)

## 2016-04-06 LAB — IRON AND TIBC
%SAT: 18 % — ABNORMAL LOW (ref 21–57)
Iron: 58 ug/dL (ref 41–142)
TIBC: 324 ug/dL (ref 236–444)
UIBC: 266 ug/dL (ref 120–384)

## 2016-04-07 ENCOUNTER — Other Ambulatory Visit: Payer: Self-pay | Admitting: Internal Medicine

## 2016-04-07 DIAGNOSIS — Z1231 Encounter for screening mammogram for malignant neoplasm of breast: Secondary | ICD-10-CM

## 2016-04-14 ENCOUNTER — Ambulatory Visit (INDEPENDENT_AMBULATORY_CARE_PROVIDER_SITE_OTHER): Payer: Medicare Other | Admitting: Internal Medicine

## 2016-04-14 ENCOUNTER — Other Ambulatory Visit (INDEPENDENT_AMBULATORY_CARE_PROVIDER_SITE_OTHER): Payer: Medicare Other

## 2016-04-14 VITALS — BP 142/66 | HR 99 | Temp 98.0°F | Resp 16 | Wt 204.0 lb

## 2016-04-14 DIAGNOSIS — N183 Chronic kidney disease, stage 3 unspecified: Secondary | ICD-10-CM

## 2016-04-14 DIAGNOSIS — I10 Essential (primary) hypertension: Secondary | ICD-10-CM | POA: Diagnosis not present

## 2016-04-14 DIAGNOSIS — E890 Postprocedural hypothyroidism: Secondary | ICD-10-CM

## 2016-04-14 DIAGNOSIS — K219 Gastro-esophageal reflux disease without esophagitis: Secondary | ICD-10-CM

## 2016-04-14 DIAGNOSIS — E785 Hyperlipidemia, unspecified: Secondary | ICD-10-CM

## 2016-04-14 LAB — COMPREHENSIVE METABOLIC PANEL
ALT: 16 U/L (ref 0–35)
AST: 18 U/L (ref 0–37)
Albumin: 4.4 g/dL (ref 3.5–5.2)
Alkaline Phosphatase: 63 U/L (ref 39–117)
BUN: 29 mg/dL — ABNORMAL HIGH (ref 6–23)
CO2: 28 mEq/L (ref 19–32)
Calcium: 9.7 mg/dL (ref 8.4–10.5)
Chloride: 108 mEq/L (ref 96–112)
Creatinine, Ser: 1.61 mg/dL — ABNORMAL HIGH (ref 0.40–1.20)
GFR: 40.69 mL/min — ABNORMAL LOW (ref >60.00)
Glucose, Bld: 89 mg/dL (ref 70–99)
Potassium: 4.5 mEq/L (ref 3.5–5.1)
Sodium: 142 mEq/L (ref 135–145)
Total Bilirubin: 0.4 mg/dL (ref 0.2–1.2)
Total Protein: 7.7 g/dL (ref 6.0–8.3)

## 2016-04-14 LAB — TSH: TSH: 0.23 u[IU]/mL — ABNORMAL LOW (ref 0.35–4.50)

## 2016-04-14 LAB — LIPID PANEL
Cholesterol: 171 mg/dL (ref 0–200)
HDL: 66 mg/dL (ref >39.00)
LDL Cholesterol: 85 mg/dL (ref 0–99)
NonHDL: 105.34
Total CHOL/HDL Ratio: 3
Triglycerides: 102 mg/dL (ref 0.0–149.0)
VLDL: 20.4 mg/dL (ref 0.0–40.0)

## 2016-04-14 NOTE — Assessment & Plan Note (Signed)
GERD controlled Continue daily medication  

## 2016-04-14 NOTE — Progress Notes (Signed)
Pre visit review using our clinic review tool, if applicable. No additional management support is needed unless otherwise documented below in the visit note. 

## 2016-04-14 NOTE — Progress Notes (Signed)
Subjective:    Patient ID: Tina Molina, female    DOB: 02-Nov-1945, 70 y.o.   MRN: [id number]  HPI The patient is here for follow up of her hypertension.  Hypertension: She is taking her medication daily. She is not compliant with a low sodium diet.  She denies chest pain, palpitations, shortness of breath and regular headaches. She is not exercising regularly.  She does not monitor her blood pressure at home.    GERD:  She is taking her medication daily as prescribed.  She denies any GERD symptoms and feels her GERD is well controlled.   Hyperlipidemia: She is taking her medication daily. She is compliant with a low fat/cholesterol diet. She is not exercising regularly. She denies myalgias.    Medications and allergies reviewed with patient and updated if appropriate.  Patient Active Problem List   Diagnosis Date Noted  . IDA (iron deficiency anemia) 04/06/2016  . Erythropoietin deficiency anemia 02/23/2016  . Chronic insomnia 08/12/2015  . GERD (gastroesophageal reflux disease) 06/25/2015  . Hyperlipidemia 01/13/2015  . Hyperglycemia 01/13/2015  . Circadian rhythm sleep disorder 08/14/2014  . Rheumatoid factor positive 02/20/2014  . Renal insufficiency 11/03/2013  . Thoracic radiculopathy 10/30/2013  . Circadian rhythm sleep disorder, shift work type 11/03/2012  . Hematuria 08/05/2012  . MGUS (monoclonal gammopathy of unknown significance) 08/03/2012  . Lumbar stenosis with neurogenic claudication 02/29/2012  . Edema 09/13/2011  . Abnormal complete blood count 05/27/2011  . HERNIATED LUMBAR DISC 12/19/2009  . PULMONARY SARCOIDOSIS 10/16/2009  . Hypothyroidism 10/16/2009  . Anemia associated with stage 2 chronic renal failure 10/16/2009  . CARPAL TUNNEL SYNDROME, BILATERAL 10/16/2009  . UVEITIS 10/16/2009  . Essential hypertension 10/16/2009  . ARTHRITIS 10/16/2009  . CERVICAL DISC DISORDER 10/16/2009    Current Outpatient Prescriptions on File Prior to Visit    Medication Sig Dispense Refill  . aspirin 81 MG tablet Take 81 mg by mouth daily.     Marland Kitchen atorvastatin (LIPITOR) 20 MG tablet Take 1 tablet (20 mg total) by mouth daily. 30 tablet 3  . budesonide-formoterol (SYMBICORT) 160-4.5 MCG/ACT inhaler Inhale 2 puffs into the lungs as needed. For wheezing 1 Inhaler 5  . carvedilol (COREG) 25 MG tablet Take 1 tablet (25 mg total) by mouth 2 (two) times daily. 180 tablet 2  . cyclobenzaprine (FLEXERIL) 10 MG tablet TAKE 1/2 TO 1 TABLET BY MOUTH AT BEDTIME IF NEEDED DO NOT TAKE WITH AMBIEN OR OTHER SLEEP AID 10 tablet 0  . gabapentin (NEURONTIN) 300 MG capsule take 1 capsule by mouth every 8 hours if needed 90 capsule 3  . hydrALAZINE (APRESOLINE) 50 MG tablet Take 1 tablet (50 mg total) by mouth 3 (three) times daily. 270 tablet 3  . leflunomide (ARAVA) 20 MG tablet Take 20 mg by mouth daily.    Marland Kitchen levothyroxine (SYNTHROID, LEVOTHROID) 150 MCG tablet take 1 tablet by mouth once daily EXCEPT 1/2 TABLET ON WEDNESDAYS 90 tablet 3  . losartan (COZAAR) 100 MG tablet Take 1 tablet (100 mg total) by mouth daily. 90 tablet 2  . Multiple Vitamin (MULTIVITAMIN WITH MINERALS) TABS Take 1 tablet by mouth daily.    . ranitidine (ZANTAC) 150 MG tablet Take 1 tablet (150 mg total) by mouth 2 (two) times daily. 180 tablet 2  . spironolactone (ALDACTONE) 50 MG tablet Take 0.5 tablets (25 mg total) by mouth daily. 90 tablet 3  . traMADol (ULTRAM) 50 MG tablet Take 50 mg by mouth every 6 (six) hours as  needed.    . vitamin E 400 UNIT capsule Take 400 Units by mouth daily.     Marland Kitchen zolpidem (AMBIEN) 10 MG tablet Take 1 tablet (10 mg total) by mouth at bedtime as needed for sleep. 90 tablet 3  . [DISCONTINUED] modafinil (PROVIGIL) 100 MG tablet Take 100 mg by mouth daily.    . [DISCONTINUED] potassium chloride (MICRO-K) 10 MEQ CR capsule Take 1 capsule (10 mEq total) by mouth 2 (two) times daily. 180 capsule 1   No current facility-administered medications on file prior to visit.      Past Medical History:  Diagnosis Date  . Anemia    Associated with leukopenia and lymphocytosis. This is been evaluated by Dr. Marin Olp . Her platelet count has been normal  . Arthritis   . Chronic back pain   . Chronic kidney disease   . Erythropoietin deficiency anemia 02/23/2016  . GERD (gastroesophageal reflux disease)   . Hypertension   . Lymphocytosis   . MGUS (monoclonal gammopathy of unknown significance) 08/03/2012   IgA 792 11/04/11  . Pneumonia 04/2010   OP  . Renal insufficiency   . Sarcoidosis ([place])   . Thyroid disease     Past Surgical History:  Procedure Laterality Date  . CARPAL TUNNEL RELEASE     Bilateral   . CATARACT EXTRACTION Right 05/2015  . CERVICAL DISCECTOMY  06/2010   Dr.Pool  . CHOLECYSTECTOMY    . COLONOSCOPY  2010  . POSTERIOR LUMBAR FUSION  02/29/2012   Dr Alyson Locket op respiratory & renal compromise  . THYROIDECTOMY  2003   For goiter    Social History   Social History  . Marital status: Legally Separated    Spouse name: N/A  . Number of children: 0  . Years of education: 12   Occupational History  . Retired Retired   Social History Main Topics  . Smoking status: Former Smoker    Packs/day: 0.50    Years: 20.00    Types: Cigarettes    Quit date: 05/17/1997  . Smokeless tobacco: Never Used  . Alcohol use 0.6 oz/week    1 Glasses of wine per week     Comment: occasionally   . Drug use: No  . Sexual activity: Not on file   Other Topics Concern  . Not on file   Social History Narrative   Patient is divorced and lives with her sister. Patient is retired.   Caffeine- sometimes - one cup   Right handed.    Family History  Problem Relation Age of Onset  . Anemia Father   . Arthritis Father   . Heart failure Father 60    No CAD  . Diabetes Mother   . Arthritis Mother   . Glaucoma Mother   . Heart disease Mother 41    No CAD  . Hypertension Brother   . Anemia Brother   . Kidney disease Brother   . Hypertension Sister    . Kidney disease Sister   . Anemia Sister   . Diabetes Maternal Aunt   . Arthritis Paternal Grandmother     Review of Systems  Constitutional: Negative for fever.  Respiratory: Negative for cough, shortness of breath and wheezing.   Cardiovascular: Positive for leg swelling (legs). Negative for chest pain and palpitations.  Neurological: Negative for light-headedness and headaches.       Objective:   Vitals:   04/14/16 1325  BP: (!) 142/66  Pulse: 99  Resp: 16  Temp: 98  F (36.7 C)   Filed Weights   04/14/16 1325  Weight: 204 lb (92.5 kg)   Body mass index is 35.02 kg/m.   Physical Exam    Constitutional: Appears well-developed and well-nourished. No distress.  HENT:  Head: Normocephalic and atraumatic.  Neck: Neck supple. No tracheal deviation present. No thyromegaly present.  No cervical lymphadenopathy Cardiovascular: Normal rate, regular rhythm and normal heart sounds.   No murmur heard. No carotid bruit .  Mild b/l le edema Pulmonary/Chest: Effort normal and breath sounds normal. No respiratory distress. No has no wheezes. No rales.  Skin: Skin is warm and dry. Not diaphoretic.  Psychiatric: Normal mood and affect. Behavior is normal.      Assessment & Plan:    See Problem List for Assessment and Plan of chronic medical problems.

## 2016-04-14 NOTE — Patient Instructions (Signed)
Test(s) ordered today. Your results will be released to [place] (or called to you) after review, usually within 72hours after test completion. If any changes need to be made, you will be notified at that same time.  All other Health Maintenance issues reviewed.   All recommended immunizations and age-appropriate screenings are up-to-date or discussed.  No immunizations administered today.   Medications reviewed and updated.  No changes recommended at this time.   Please followup in 6 months    Hypertension Hypertension, commonly called high blood pressure, is when the force of blood pumping through your arteries is too strong. Your arteries are the blood vessels that carry blood from your heart throughout your body. A blood pressure reading consists of a higher number over a lower number, such as 110/72. The higher number (systolic) is the pressure inside your arteries when your heart pumps. The lower number (diastolic) is the pressure inside your arteries when your heart relaxes. Ideally you want your blood pressure below 120/80. Hypertension forces your heart to work harder to pump blood. Your arteries may become narrow or stiff. Having untreated or uncontrolled hypertension can cause heart attack, stroke, kidney disease, and other problems. What increases the risk? Some risk factors for high blood pressure are controllable. Others are not. Risk factors you cannot control include:  Race. You may be at higher risk if you are African American.  Age. Risk increases with age.  Gender. Men are at higher risk than women before age 45 years. After age 93, women are at higher risk than men. Risk factors you can control include:  Not getting enough exercise or physical activity.  Being overweight.  Getting too much fat, sugar, calories, or salt in your diet.  Drinking too much alcohol. What are the signs or symptoms? Hypertension does not usually cause signs or symptoms. Extremely high  blood pressure (hypertensive crisis) may cause headache, anxiety, shortness of breath, and nosebleed. How is this diagnosed? To check if you have hypertension, your health care provider will measure your blood pressure while you are seated, with your arm held at the level of your heart. It should be measured at least twice using the same arm. Certain conditions can cause a difference in blood pressure between your right and left arms. A blood pressure reading that is higher than normal on one occasion does not mean that you need treatment. If it is not clear whether you have high blood pressure, you may be asked to return on a different day to have your blood pressure checked again. Or, you may be asked to monitor your blood pressure at home for 1 or more weeks. How is this treated? Treating high blood pressure includes making lifestyle changes and possibly taking medicine. Living a healthy lifestyle can help lower high blood pressure. You may need to change some of your habits. Lifestyle changes may include:  Following the DASH diet. This diet is high in fruits, vegetables, and whole grains. It is low in salt, red meat, and added sugars.  Keep your sodium intake below 2,300 mg per day.  Getting at least 30-45 minutes of aerobic exercise at least 4 times per week.  Losing weight if necessary.  Not smoking.  Limiting alcoholic beverages.  Learning ways to reduce stress. Your health care provider may prescribe medicine if lifestyle changes are not enough to get your blood pressure under control, and if one of the following is true:  You are 76-59 years of age and your systolic blood  pressure is above 140.  You are 69 years of age or older, and your systolic blood pressure is above 150.  Your diastolic blood pressure is above 90.  You have diabetes, and your systolic blood pressure is over 979 or your diastolic blood pressure is over 90.  You have kidney disease and your blood pressure is  above 140/90.  You have heart disease and your blood pressure is above 140/90. Your personal target blood pressure may vary depending on your medical conditions, your age, and other factors. Follow these instructions at home:  Have your blood pressure rechecked as directed by your health care provider.  Take medicines only as directed by your health care provider. Follow the directions carefully. Blood pressure medicines must be taken as prescribed. The medicine does not work as well when you skip doses. Skipping doses also puts you at risk for problems.  Do not smoke.  Monitor your blood pressure at home as directed by your health care provider. Contact a health care provider if:  You think you are having a reaction to medicines taken.  You have recurrent headaches or feel dizzy.  You have swelling in your ankles.  You have trouble with your vision. Get help right away if:  You develop a severe headache or confusion.  You have unusual weakness, numbness, or feel faint.  You have severe chest or abdominal pain.  You vomit repeatedly.  You have trouble breathing. This information is not intended to replace advice given to you by your health care provider. Make sure you discuss any questions you have with your health care provider. Document Released: 05/03/2005 Document Revised: 10/09/2015 Document Reviewed: 02/23/2013 Elsevier Interactive Patient Education  2017 [employer].

## 2016-04-15 ENCOUNTER — Other Ambulatory Visit: Payer: Self-pay | Admitting: Emergency Medicine

## 2016-04-15 ENCOUNTER — Encounter: Payer: Self-pay | Admitting: Internal Medicine

## 2016-04-15 DIAGNOSIS — E039 Hypothyroidism, unspecified: Secondary | ICD-10-CM

## 2016-04-15 MED ORDER — LEVOTHYROXINE SODIUM 125 MCG PO TABS: 125.0000 ug | ORAL_TABLET | Freq: Every day | ORAL | 3 refills | Status: DC

## 2016-04-15 NOTE — Assessment & Plan Note (Signed)
cmp

## 2016-04-15 NOTE — Assessment & Plan Note (Signed)
Check tsh  Titrate med dose if needed  

## 2016-04-15 NOTE — Progress Notes (Signed)
Labs and med ordered per MD request.

## 2016-04-15 NOTE — Assessment & Plan Note (Signed)
Check lipids. Continue statin.

## 2016-04-15 NOTE — Assessment & Plan Note (Signed)
BP slightly elevated - stressed need for better control Stressed low sodium diet, exercise and weight loss She agrees to buy a cuff for home and start monitoring Stressed better control to preserve kidney function Already on 4 medications Cmp, tsh

## 2016-04-19 ENCOUNTER — Other Ambulatory Visit: Payer: Medicare Other

## 2016-04-19 ENCOUNTER — Ambulatory Visit: Payer: Medicare Other

## 2016-04-20 ENCOUNTER — Ambulatory Visit (HOSPITAL_BASED_OUTPATIENT_CLINIC_OR_DEPARTMENT_OTHER): Payer: Medicare Other

## 2016-04-20 VITALS — BP 127/52 | HR 96 | Temp 98.6°F | Resp 18

## 2016-04-20 DIAGNOSIS — N182 Chronic kidney disease, stage 2 (mild): Secondary | ICD-10-CM | POA: Diagnosis not present

## 2016-04-20 DIAGNOSIS — D509 Iron deficiency anemia, unspecified: Secondary | ICD-10-CM

## 2016-04-20 DIAGNOSIS — D508 Other iron deficiency anemias: Secondary | ICD-10-CM

## 2016-04-20 DIAGNOSIS — D631 Anemia in chronic kidney disease: Secondary | ICD-10-CM

## 2016-04-20 MED ORDER — SODIUM CHLORIDE 0.9 % IV SOLN
Freq: Once | INTRAVENOUS | Status: AC
  Administered 2016-04-20: 13:00:00 via INTRAVENOUS

## 2016-04-20 MED ORDER — SODIUM CHLORIDE 0.9 % IV SOLN
510.0000 mg | Freq: Once | INTRAVENOUS | Status: AC
  Administered 2016-04-20: 510 mg via INTRAVENOUS
  Filled 2016-04-20: qty 17

## 2016-04-20 NOTE — Patient Instructions (Signed)
Ferumoxytol injection What is this medicine? FERUMOXYTOL is an iron complex. Iron is used to make healthy red blood cells, which carry oxygen and nutrients throughout the body. This medicine is used to treat iron deficiency anemia in people with chronic kidney disease. COMMON BRAND NAME(S): Feraheme What should I tell my health care provider before I take this medicine? They need to know if you have any of these conditions: -anemia not caused by low iron levels -high levels of iron in the blood -magnetic resonance imaging (MRI) test scheduled -an unusual or allergic reaction to iron, other medicines, foods, dyes, or preservatives -pregnant or trying to get pregnant -breast-feeding How should I use this medicine? This medicine is for injection into a vein. It is given by a health care professional in a hospital or clinic setting. Talk to your pediatrician regarding the use of this medicine in children. Special care may be needed. What if I miss a dose? It is important not to miss your dose. Call your doctor or health care professional if you are unable to keep an appointment. What may interact with this medicine? This medicine may interact with the following medications: -other iron products What should I watch for while using this medicine? Visit your doctor or healthcare professional regularly. Tell your doctor or healthcare professional if your symptoms do not start to get better or if they get worse. You may need blood work done while you are taking this medicine. You may need to follow a special diet. Talk to your doctor. Foods that contain iron include: whole grains/cereals, dried fruits, beans, or peas, leafy green vegetables, and organ meats (liver, kidney). What side effects may I notice from receiving this medicine? Side effects that you should report to your doctor or health care professional as soon as possible: -allergic reactions like skin rash, itching or hives, swelling of the  face, lips, or tongue -breathing problems -changes in blood pressure -feeling faint or lightheaded, falls -fever or chills -flushing, sweating, or hot feelings -swelling of the ankles or feet Side effects that usually do not require medical attention (report to your doctor or health care professional if they continue or are bothersome): -diarrhea -headache -nausea, vomiting -stomach pain Where should I keep my medicine? This drug is given in a hospital or clinic and will not be stored at home.  2017 Elsevier/Gold Standard (2015-06-05 12:41:49)  

## 2016-04-26 ENCOUNTER — Other Ambulatory Visit: Payer: Self-pay | Admitting: Internal Medicine

## 2016-04-30 ENCOUNTER — Ambulatory Visit
Admission: RE | Admit: 2016-04-30 | Discharge: 2016-04-30 | Disposition: A | Discharge status: Home / Self Care | Payer: Medicare Other | Source: Ambulatory Visit | Attending: Internal Medicine | Admitting: Internal Medicine

## 2016-04-30 DIAGNOSIS — Z1231 Encounter for screening mammogram for malignant neoplasm of breast: Secondary | ICD-10-CM

## 2016-05-09 ENCOUNTER — Other Ambulatory Visit: Payer: Self-pay | Admitting: Neurology

## 2016-05-09 DIAGNOSIS — F5104 Psychophysiologic insomnia: Secondary | ICD-10-CM

## 2016-05-12 ENCOUNTER — Other Ambulatory Visit: Payer: Self-pay | Admitting: Neurology

## 2016-05-12 DIAGNOSIS — M150 Primary generalized (osteo)arthritis: Secondary | ICD-10-CM | POA: Diagnosis not present

## 2016-05-12 DIAGNOSIS — M25511 Pain in right shoulder: Secondary | ICD-10-CM | POA: Diagnosis not present

## 2016-05-12 DIAGNOSIS — M5136 Other intervertebral disc degeneration, lumbar region: Secondary | ICD-10-CM | POA: Diagnosis not present

## 2016-05-12 DIAGNOSIS — F5104 Psychophysiologic insomnia: Secondary | ICD-10-CM

## 2016-05-12 DIAGNOSIS — N183 Chronic kidney disease, stage 3 (moderate): Secondary | ICD-10-CM | POA: Diagnosis not present

## 2016-05-12 DIAGNOSIS — E669 Obesity, unspecified: Secondary | ICD-10-CM | POA: Diagnosis not present

## 2016-05-12 DIAGNOSIS — Z6835 Body mass index (BMI) 35.0-35.9, adult: Secondary | ICD-10-CM | POA: Diagnosis not present

## 2016-05-12 DIAGNOSIS — M0589 Other rheumatoid arthritis with rheumatoid factor of multiple sites: Secondary | ICD-10-CM | POA: Diagnosis not present

## 2016-05-12 DIAGNOSIS — D860 Sarcoidosis of lung: Secondary | ICD-10-CM | POA: Diagnosis not present

## 2016-05-20 ENCOUNTER — Other Ambulatory Visit: Payer: Self-pay | Admitting: Neurology

## 2016-05-20 DIAGNOSIS — F5104 Psychophysiologic insomnia: Secondary | ICD-10-CM

## 2016-05-20 MED ORDER — ZOLPIDEM TARTRATE 10 MG PO TABS: 10.0000 mg | ORAL_TABLET | Freq: Every evening | ORAL | 1 refills | Status: DC | PRN

## 2016-05-20 NOTE — Telephone Encounter (Signed)
Controlled substance prescriptions are only valid for 6 months, therefore, pt is due for a refill on her ambien. Will send to Dr. Krista Blue [facility]) for review.

## 2016-05-20 NOTE — Telephone Encounter (Signed)
Pt requesting refill for zolpidem (AMBIEN) 10 MG tablet

## 2016-05-20 NOTE — Addendum Note (Signed)
Addended by: Lester Cobb A on: 05/20/2016 11:45 AM   Modules accepted: Orders

## 2016-05-20 NOTE — Telephone Encounter (Signed)
RX for [employer] [facility] [employer] faxed to [facility]. Received a receipt of confirmation.

## 2016-06-15 ENCOUNTER — Other Ambulatory Visit (INDEPENDENT_AMBULATORY_CARE_PROVIDER_SITE_OTHER): Payer: Medicare Other

## 2016-06-15 ENCOUNTER — Encounter: Payer: Self-pay | Admitting: Internal Medicine

## 2016-06-15 DIAGNOSIS — E039 Hypothyroidism, unspecified: Secondary | ICD-10-CM | POA: Diagnosis not present

## 2016-06-15 LAB — TSH: TSH: 2.14 u[IU]/mL (ref 0.35–4.50)

## 2016-06-20 ENCOUNTER — Other Ambulatory Visit: Payer: Self-pay | Admitting: Internal Medicine

## 2016-06-22 ENCOUNTER — Other Ambulatory Visit: Payer: Self-pay | Admitting: Internal Medicine

## 2016-07-07 ENCOUNTER — Ambulatory Visit (HOSPITAL_BASED_OUTPATIENT_CLINIC_OR_DEPARTMENT_OTHER): Payer: Medicare Other | Admitting: Family

## 2016-07-07 ENCOUNTER — Other Ambulatory Visit (HOSPITAL_BASED_OUTPATIENT_CLINIC_OR_DEPARTMENT_OTHER): Payer: Medicare Other

## 2016-07-07 ENCOUNTER — Ambulatory Visit (HOSPITAL_BASED_OUTPATIENT_CLINIC_OR_DEPARTMENT_OTHER): Payer: Medicare Other

## 2016-07-07 VITALS — BP 123/57 | HR 92 | Temp 98.3°F | Resp 20 | Wt 208.0 lb

## 2016-07-07 DIAGNOSIS — D509 Iron deficiency anemia, unspecified: Secondary | ICD-10-CM

## 2016-07-07 DIAGNOSIS — N182 Chronic kidney disease, stage 2 (mild): Secondary | ICD-10-CM | POA: Diagnosis not present

## 2016-07-07 DIAGNOSIS — D472 Monoclonal gammopathy: Secondary | ICD-10-CM

## 2016-07-07 DIAGNOSIS — D508 Other iron deficiency anemias: Secondary | ICD-10-CM

## 2016-07-07 DIAGNOSIS — D631 Anemia in chronic kidney disease: Secondary | ICD-10-CM | POA: Diagnosis not present

## 2016-07-07 DIAGNOSIS — N183 Chronic kidney disease, stage 3 unspecified: Secondary | ICD-10-CM

## 2016-07-07 LAB — CBC WITH DIFFERENTIAL (CANCER CENTER ONLY)
BASO#: 0 10*3/uL (ref 0.0–0.2)
BASO%: 0.8 % (ref 0.0–2.0)
EOS%: 5.6 % (ref 0.0–7.0)
Eosinophils Absolute: 0.2 10*3/uL (ref 0.0–0.5)
HCT: 27.7 % — ABNORMAL LOW (ref 34.8–46.6)
HGB: 9.4 g/dL — ABNORMAL LOW (ref 11.6–15.9)
LYMPH#: 1.6 10*3/uL (ref 0.9–3.3)
LYMPH%: 46.2 % (ref 14.0–48.0)
MCH: 32.3 pg (ref 26.0–34.0)
MCHC: 33.9 g/dL (ref 32.0–36.0)
MCV: 95 fL (ref 81–101)
MONO#: 0.4 10*3/uL (ref 0.1–0.9)
MONO%: 11.5 % (ref 0.0–13.0)
NEUT#: 1.3 10*3/uL — ABNORMAL LOW (ref 1.5–6.5)
NEUT%: 35.9 % — ABNORMAL LOW (ref 39.6–80.0)
Platelets: 174 10*3/uL (ref 145–400)
RBC: 2.91 10*6/uL — ABNORMAL LOW (ref 3.70–5.32)
RDW: 15.7 % (ref 11.1–15.7)
WBC: 3.6 10*3/uL — ABNORMAL LOW (ref 3.9–10.0)

## 2016-07-07 LAB — COMPREHENSIVE METABOLIC PANEL (CC13)
ALT: 12 IU/L (ref 0–32)
AST (SGOT): 16 IU/L (ref 0–40)
Albumin, Serum: 4.3 g/dL (ref 3.5–4.8)
Albumin/Globulin Ratio: 1.6 (ref 1.2–2.2)
Alkaline Phosphatase, S: 63 IU/L (ref 39–117)
BUN/Creatinine Ratio: 20 (ref 12–28)
BUN: 31 mg/dL — ABNORMAL HIGH (ref 8–27)
Bilirubin Total: 0.3 mg/dL (ref 0.0–1.2)
Calcium, Ser: 9.3 mg/dL (ref 8.7–10.3)
Carbon Dioxide, Total: 22 mmol/L (ref 18–29)
Chloride, Ser: 110 mmol/L — ABNORMAL HIGH (ref 96–106)
Creatinine, Ser: 1.58 mg/dL — ABNORMAL HIGH (ref 0.57–1.00)
GFR calc Af Amer: 38 — ABNORMAL LOW (ref >59)
GFR calc non Af Amer: 33 — ABNORMAL LOW (ref >59)
Globulin, Total: 2.7 (ref 1.5–4.5)
Glucose: 127 mg/dL — ABNORMAL HIGH (ref 65–99)
Potassium, Ser: 3.7 mmol/L (ref 3.5–5.2)
Sodium: 138 mmol/L (ref 134–144)
Total Protein: 7 g/dL (ref 6.0–8.5)

## 2016-07-07 MED ORDER — DARBEPOETIN ALFA 300 MCG/0.6ML IJ SOSY
300.0000 ug | PREFILLED_SYRINGE | Freq: Once | INTRAMUSCULAR | Status: AC
  Administered 2016-07-07: 300 ug via SUBCUTANEOUS

## 2016-07-07 MED ORDER — DARBEPOETIN ALFA 300 MCG/0.6ML IJ SOSY
PREFILLED_SYRINGE | INTRAMUSCULAR | Status: AC
  Filled 2016-07-07: qty 0.6

## 2016-07-07 NOTE — Progress Notes (Signed)
Hematology and Oncology Follow Up Visit  Tina Molina [id number] March 26, 1946 71 y.o. 07/07/2016   Principle Diagnosis:  Anemia of chronic renal failure - stage II MGUS Iron deficiency anemia   Current Therapy:   Aranesp 300 mcg SQ to keep Hgb > 10 IV iron as indicated  - last received in December 2017     Interim History:  Tina Molina is here today for a follow-up. She is symptomatic at this time with fatigue/weakness and SOB with over exertion. She takes time to rest as needed. Hgb is 9.4 with an MCV of 95. She denies any episodes of bleeding or bruising.  M-spike in September was stable at 0.2 g/dL and IgA was 473 mg/dL. Levels with todays lab work are pending.  She has had no problem with infections. No fever, chills, n/v, cough, rash, dizziness, SOB, chest pain, palpitations, abdominal pain or changes in bowel or bladder habits.  No lymphadenopathy found on exam.  No swelling or tenderness in her extremities. She has numbness and tingling in her hands and feet due to chronic back issues. This is unchanged.  She has maintained a good appetite and is staying well hydrated. Her weight is stable.   Medications:  Allergies as of 07/07/2016      Reactions   Penicillins    Intolerance or allergy not remembered; her sister stated penicillin caused "boils under the skin"   Shellfish Allergy Rash   Sulfonamide Derivatives    Arthralgias; her sister stated that this caused a rash.   Azithromycin    itching   Codeine    Nausea only with liquid codeine   Infliximab    Other reaction(s): Confusion (intolerance)      Medication List       Accurate as of 07/07/16  3:00 PM. Always use your most recent med list.          aspirin 81 MG tablet Take 81 mg by mouth daily.   atorvastatin 20 MG tablet Commonly known as:  LIPITOR take 1 tablet by mouth once daily   budesonide-formoterol 160-4.5 MCG/ACT inhaler Commonly known as:  SYMBICORT Inhale 2 puffs into the lungs as needed. For  wheezing   carvedilol 25 MG tablet Commonly known as:  COREG take 1 tablet by mouth twice a day   gabapentin 300 MG capsule Commonly known as:  NEURONTIN take 1 capsule by mouth every 8 hours if needed   hydrALAZINE 50 MG tablet Commonly known as:  APRESOLINE Take 1 tablet (50 mg total) by mouth 3 (three) times daily.   leflunomide 20 MG tablet Commonly known as:  ARAVA Take 20 mg by mouth daily.   levothyroxine 125 MCG tablet Commonly known as:  SYNTHROID, LEVOTHROID Take 1 tablet (125 mcg total) by mouth daily.   losartan 100 MG tablet Commonly known as:  COZAAR take 1 tablet by mouth once daily   methocarbamol 500 MG tablet Commonly known as:  ROBAXIN 500 mg.   multivitamin with minerals Tabs tablet Take 1 tablet by mouth daily.   ranitidine 150 MG tablet Commonly known as:  ZANTAC take 1 tablet by mouth twice a day   spironolactone 50 MG tablet Commonly known as:  ALDACTONE Take 0.5 tablets (25 mg total) by mouth daily.   vitamin E 400 UNIT capsule Take 400 Units by mouth daily.   zolpidem 10 MG tablet Commonly known as:  AMBIEN Take 1 tablet (10 mg total) by mouth at bedtime as needed for sleep.  Allergies:  Allergies  Allergen Reactions  . Penicillins     Intolerance or allergy not remembered; her sister stated penicillin caused "boils under the skin"  . Shellfish Allergy Rash  . Sulfonamide Derivatives     Arthralgias; her sister stated that this caused a rash.  . Azithromycin     itching  . Codeine     Nausea only with liquid codeine  . Infliximab     Other reaction(s): Confusion (intolerance)    Past Medical History, Surgical history, Social history, and Family History were reviewed and updated.  Review of Systems: All other 10 point review of systems is negative.   Physical Exam:  weight is 208 lb (94.3 kg). Her oral temperature is 98.3 F (36.8 C). Her blood pressure is 123/57 (abnormal) and her pulse is 92. Her respiration is  20.   Wt Readings from Last 3 Encounters:  07/07/16 208 lb (94.3 kg)  04/14/16 204 lb (92.5 kg)  04/05/16 204 lb (92.5 kg)    Ocular: Sclerae unicteric, pupils equal, round and reactive to light Ear-nose-throat: Oropharynx clear, dentition fair Lymphatic: No cervical, supraclavicular or axillary adenopathy Lungs no rales or rhonchi, good excursion bilaterally Heart regular rate and rhythm, no murmur appreciated Abd soft, nontender, positive bowel sounds, no liver or spleen tip palpated on exam, no fluid wave MSK no focal spinal tenderness, no joint edema Neuro: non-focal, well-oriented, appropriate affect Breasts: Deferred  Lab Results  Component Value Date   WBC 3.6 (L) 07/07/2016   HGB 9.4 (L) 07/07/2016   HCT 27.7 (L) 07/07/2016   MCV 95 07/07/2016   PLT 174 07/07/2016   Lab Results  Component Value Date   FERRITIN 41 04/05/2016   IRON 58 04/05/2016   TIBC 324 04/05/2016   UIBC 266 04/05/2016   IRONPCTSAT 18 (L) 04/05/2016   Lab Results  Component Value Date   RETICCTPCT 1.5 01/03/2013   RBC 2.91 (L) 07/07/2016   RETICCTABS 51.6 01/03/2013   Lab Results  Component Value Date   KPAFRELGTCHN 1.97 (H) 01/03/2013   LAMBDASER 5.55 (H) 01/03/2013   KAPLAMBRATIO 0.51 02/11/2016   Lab Results  Component Value Date   IGGSERUM 891 02/11/2016   IGA 661 (H) 01/03/2013   IGMSERUM 29 02/11/2016   Lab Results  Component Value Date   TOTALPROTELP 7.2 01/03/2013   ALBUMINELP 57.9 11/04/2011   A1GS 3.1 11/04/2011   A2GS 8.9 11/04/2011   BETS 5.9 11/04/2011   BETA2SER 10.3 (H) 11/04/2011   GAMS 13.9 11/04/2011   MSPIKE 0.2 (H) 02/11/2016   SPEI * 11/04/2011     Chemistry      Component Value Date/Time   NA 142 04/14/2016 1443   NA 143 04/05/2016 1156   K 4.5 04/14/2016 1443   K 3.8 04/05/2016 1156   CL 108 04/14/2016 1443   CL 107 (H) 02/11/2016 1451   CO2 28 04/14/2016 1443   CO2 21 (L) 04/05/2016 1156   BUN 29 (H) 04/14/2016 1443   BUN 18.5 04/05/2016  1156   CREATININE 1.61 (H) 04/14/2016 1443   CREATININE 1.5 (H) 04/05/2016 1156      Component Value Date/Time   CALCIUM 9.7 04/14/2016 1443   CALCIUM 9.3 04/05/2016 1156   ALKPHOS 63 04/14/2016 1443   ALKPHOS 77 04/05/2016 1156   AST 18 04/14/2016 1443   AST 21 04/05/2016 1156   ALT 16 04/14/2016 1443   ALT 22 04/05/2016 1156   BILITOT 0.4 04/14/2016 1443   BILITOT 0.36 04/05/2016 1156  Impression and Plan: Ms. Corvin is a very nice 71 yo African-American female with anemia of chronic renal disease and MGUS. Results for protein studies for today are pending. So far, these have been stable.  Hgb is 9.4 today so she will get Aranesp. We will see what her iron studies show and bring her back in next week for an infusion if needed.  We will plan to see her back in 3 months for repeat lab work and follow-up.  She will contact our office with any questions or concerns. We can certainly see her sooner if need be.   Eliezer Bottom, NP 2/21/20183:00 PM

## 2016-07-08 LAB — ERYTHROPOIETIN: Erythropoietin: 19.6 m[IU]/mL — ABNORMAL HIGH (ref 2.6–18.5)

## 2016-07-08 LAB — IRON AND TIBC
%SAT: 51 % (ref 21–57)
Iron: 139 ug/dL (ref 41–142)
TIBC: 273 ug/dL (ref 236–444)
UIBC: 133 ug/dL (ref 120–384)

## 2016-07-08 LAB — IGG, IGA, IGM
IgA, Qn, Serum: 517 mg/dL — ABNORMAL HIGH (ref 87–352)
IgG, Qn, Serum: 851 mg/dL (ref 700–1600)
IgM, Qn, Serum: 30 mg/dL (ref 26–217)

## 2016-07-08 LAB — KAPPA/LAMBDA LIGHT CHAINS
Ig Kappa Free Light Chain: 18.7 mg/L (ref 3.3–19.4)
Ig Lambda Free Light Chain: 33 mg/L — ABNORMAL HIGH (ref 5.7–26.3)
Kappa/Lambda FluidC Ratio: 0.57 (ref 0.26–1.65)

## 2016-07-08 LAB — FERRITIN: Ferritin: 286 ng/ml — ABNORMAL HIGH (ref 9–269)

## 2016-07-09 ENCOUNTER — Encounter: Payer: Self-pay | Admitting: *Deleted

## 2016-07-09 LAB — PROTEIN ELECTROPHORESIS, SERUM
A/G Ratio: 1.5 (ref 0.7–1.7)
Albumin: 4.1 g/dL (ref 2.9–4.4)
Alpha 1: 0.2 g/dL (ref 0.0–0.4)
Alpha 2: 0.6 g/dL (ref 0.4–1.0)
Beta: 1.2 g/dL (ref 0.7–1.3)
Gamma Globulin: 0.8 g/dL (ref 0.4–1.8)
Globulin, Total: 2.7 g/dL (ref 2.2–3.9)
M-Spike, %: 0.2 g/dL — ABNORMAL HIGH
Total Protein: 6.8 g/dL (ref 6.0–8.5)

## 2016-07-12 ENCOUNTER — Other Ambulatory Visit: Payer: Self-pay | Admitting: Internal Medicine

## 2016-08-02 ENCOUNTER — Other Ambulatory Visit: Payer: Self-pay | Admitting: Emergency Medicine

## 2016-08-02 MED ORDER — ATORVASTATIN CALCIUM 20 MG PO TABS: 20.0000 mg | ORAL_TABLET | Freq: Every day | ORAL | 2 refills | Status: DC

## 2016-08-03 ENCOUNTER — Telehealth: Payer: Self-pay

## 2016-08-03 NOTE — Telephone Encounter (Signed)
PA for Raymond completed, sent to [place], [place] has been approved until 05/16/2017.

## 2016-08-11 DIAGNOSIS — M150 Primary generalized (osteo)arthritis: Secondary | ICD-10-CM | POA: Diagnosis not present

## 2016-08-11 DIAGNOSIS — M0589 Other rheumatoid arthritis with rheumatoid factor of multiple sites: Secondary | ICD-10-CM | POA: Diagnosis not present

## 2016-08-11 DIAGNOSIS — D860 Sarcoidosis of lung: Secondary | ICD-10-CM | POA: Diagnosis not present

## 2016-08-11 DIAGNOSIS — M25511 Pain in right shoulder: Secondary | ICD-10-CM | POA: Diagnosis not present

## 2016-08-11 DIAGNOSIS — M5136 Other intervertebral disc degeneration, lumbar region: Secondary | ICD-10-CM | POA: Diagnosis not present

## 2016-08-11 DIAGNOSIS — E669 Obesity, unspecified: Secondary | ICD-10-CM | POA: Diagnosis not present

## 2016-08-11 DIAGNOSIS — N183 Chronic kidney disease, stage 3 (moderate): Secondary | ICD-10-CM | POA: Diagnosis not present

## 2016-08-11 DIAGNOSIS — M7989 Other specified soft tissue disorders: Secondary | ICD-10-CM | POA: Diagnosis not present

## 2016-08-11 DIAGNOSIS — Z6836 Body mass index (BMI) 36.0-36.9, adult: Secondary | ICD-10-CM | POA: Diagnosis not present

## 2016-08-18 ENCOUNTER — Other Ambulatory Visit: Payer: Self-pay | Admitting: Internal Medicine

## 2016-09-02 ENCOUNTER — Other Ambulatory Visit: Payer: Self-pay | Admitting: Internal Medicine

## 2016-09-08 ENCOUNTER — Other Ambulatory Visit: Payer: Self-pay | Admitting: Internal Medicine

## 2016-09-08 DIAGNOSIS — I10 Essential (primary) hypertension: Secondary | ICD-10-CM

## 2016-09-27 ENCOUNTER — Other Ambulatory Visit: Payer: Self-pay | Admitting: Internal Medicine

## 2016-10-06 ENCOUNTER — Other Ambulatory Visit (HOSPITAL_BASED_OUTPATIENT_CLINIC_OR_DEPARTMENT_OTHER): Payer: Medicare Other

## 2016-10-06 ENCOUNTER — Ambulatory Visit (HOSPITAL_BASED_OUTPATIENT_CLINIC_OR_DEPARTMENT_OTHER): Payer: Medicare Other

## 2016-10-06 ENCOUNTER — Ambulatory Visit (HOSPITAL_BASED_OUTPATIENT_CLINIC_OR_DEPARTMENT_OTHER): Payer: Medicare Other | Admitting: Hematology & Oncology

## 2016-10-06 VITALS — BP 145/71 | HR 94 | Temp 98.5°F | Resp 16 | Wt 202.8 lb

## 2016-10-06 DIAGNOSIS — D631 Anemia in chronic kidney disease: Secondary | ICD-10-CM | POA: Diagnosis not present

## 2016-10-06 DIAGNOSIS — D509 Iron deficiency anemia, unspecified: Secondary | ICD-10-CM | POA: Diagnosis not present

## 2016-10-06 DIAGNOSIS — D508 Other iron deficiency anemias: Secondary | ICD-10-CM | POA: Diagnosis not present

## 2016-10-06 DIAGNOSIS — N182 Chronic kidney disease, stage 2 (mild): Secondary | ICD-10-CM

## 2016-10-06 DIAGNOSIS — N183 Chronic kidney disease, stage 3 unspecified: Secondary | ICD-10-CM

## 2016-10-06 DIAGNOSIS — D472 Monoclonal gammopathy: Secondary | ICD-10-CM

## 2016-10-06 DIAGNOSIS — D500 Iron deficiency anemia secondary to blood loss (chronic): Secondary | ICD-10-CM

## 2016-10-06 LAB — CBC WITH DIFFERENTIAL (CANCER CENTER ONLY)
BASO#: 0 10*3/uL (ref 0.0–0.2)
BASO%: 0.5 % (ref 0.0–2.0)
EOS%: 4 % (ref 0.0–7.0)
Eosinophils Absolute: 0.2 10*3/uL (ref 0.0–0.5)
HCT: 31.3 % — ABNORMAL LOW (ref 34.8–46.6)
HGB: 10.3 g/dL — ABNORMAL LOW (ref 11.6–15.9)
LYMPH#: 2 10*3/uL (ref 0.9–3.3)
LYMPH%: 54 % — ABNORMAL HIGH (ref 14.0–48.0)
MCH: 31.7 pg (ref 26.0–34.0)
MCHC: 32.9 g/dL (ref 32.0–36.0)
MCV: 96 fL (ref 81–101)
MONO#: 0.4 10*3/uL (ref 0.1–0.9)
MONO%: 11 % (ref 0.0–13.0)
NEUT#: 1.1 10*3/uL — ABNORMAL LOW (ref 1.5–6.5)
NEUT%: 30.5 % — ABNORMAL LOW (ref 39.6–80.0)
Platelets: 190 10*3/uL (ref 145–400)
RBC: 3.25 10*6/uL — ABNORMAL LOW (ref 3.70–5.32)
RDW: 13.2 % (ref 11.1–15.7)
WBC: 3.7 10*3/uL — ABNORMAL LOW (ref 3.9–10.0)

## 2016-10-06 LAB — COMPREHENSIVE METABOLIC PANEL (CC13)
ALT: 14 IU/L (ref 0–32)
AST (SGOT): 20 IU/L (ref 0–40)
Albumin, Serum: 4.4 g/dL (ref 3.5–4.8)
Albumin/Globulin Ratio: 1.6 (ref 1.2–2.2)
Alkaline Phosphatase, S: 67 IU/L (ref 39–117)
BUN/Creatinine Ratio: 15 (ref 12–28)
BUN: 26 mg/dL (ref 8–27)
Bilirubin Total: 0.3 mg/dL (ref 0.0–1.2)
Calcium, Ser: 8.9 mg/dL (ref 8.7–10.3)
Carbon Dioxide, Total: 27 mmol/L (ref 18–29)
Chloride, Ser: 106 mmol/L (ref 96–106)
Creatinine, Ser: 1.73 mg/dL — ABNORMAL HIGH (ref 0.57–1.00)
GFR calc Af Amer: 34 mL/min/{1.73_m2} — ABNORMAL LOW (ref >59)
GFR calc non Af Amer: 29 mL/min/{1.73_m2} — ABNORMAL LOW (ref >59)
Globulin, Total: 2.7 g/dL (ref 1.5–4.5)
Glucose: 104 mg/dL — ABNORMAL HIGH (ref 65–99)
Potassium, Ser: 4.1 mmol/L (ref 3.5–5.2)
Sodium: 139 mmol/L (ref 134–144)
Total Protein: 7.1 g/dL (ref 6.0–8.5)

## 2016-10-06 MED ORDER — DARBEPOETIN ALFA 300 MCG/0.6ML IJ SOSY
300.0000 ug | PREFILLED_SYRINGE | Freq: Once | INTRAMUSCULAR | Status: AC
  Administered 2016-10-06: 300 ug via SUBCUTANEOUS

## 2016-10-06 MED ORDER — DARBEPOETIN ALFA 300 MCG/0.6ML IJ SOSY
PREFILLED_SYRINGE | INTRAMUSCULAR | Status: AC
  Filled 2016-10-06: qty 0.6

## 2016-10-06 NOTE — Patient Instructions (Signed)
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
## 2016-10-07 LAB — PROTEIN ELECTROPHORESIS, SERUM
A/G Ratio: 1.2 (ref 0.7–1.7)
Albumin: 3.9 g/dL (ref 2.9–4.4)
Alpha 1: 0.2 g/dL (ref 0.0–0.4)
Alpha 2: 0.7 g/dL (ref 0.4–1.0)
Beta: 1.4 g/dL — ABNORMAL HIGH (ref 0.7–1.3)
Gamma Globulin: 0.9 g/dL (ref 0.4–1.8)
Globulin, Total: 3.2 g/dL (ref 2.2–3.9)
M-Spike, %: 0.3 g/dL — ABNORMAL HIGH
Total Protein: 7.1 g/dL (ref 6.0–8.5)

## 2016-10-07 LAB — IRON AND TIBC
%SAT: 40 % (ref 21–57)
Iron: 111 ug/dL (ref 41–142)
TIBC: 278 ug/dL (ref 236–444)
UIBC: 167 ug/dL (ref 120–384)

## 2016-10-07 LAB — KAPPA/LAMBDA LIGHT CHAINS
Ig Kappa Free Light Chain: 17.6 mg/L (ref 3.3–19.4)
Ig Lambda Free Light Chain: 34.5 mg/L — ABNORMAL HIGH (ref 5.7–26.3)
Kappa/Lambda FluidC Ratio: 0.51 (ref 0.26–1.65)

## 2016-10-07 LAB — IGG, IGA, IGM
IgA, Qn, Serum: 523 mg/dL — ABNORMAL HIGH (ref 87–352)
IgG, Qn, Serum: 878 mg/dL (ref 700–1600)
IgM, Qn, Serum: 28 mg/dL (ref 26–217)

## 2016-10-07 LAB — FERRITIN: Ferritin: 241 ng/ml (ref 9–269)

## 2016-10-07 LAB — BETA 2 MICROGLOBULIN, SERUM: Beta-2: 2.9 mg/L — ABNORMAL HIGH (ref 0.6–2.4)

## 2016-10-19 ENCOUNTER — Encounter: Payer: Self-pay | Admitting: Internal Medicine

## 2016-10-19 ENCOUNTER — Ambulatory Visit (INDEPENDENT_AMBULATORY_CARE_PROVIDER_SITE_OTHER): Payer: Medicare Other | Admitting: Internal Medicine

## 2016-10-19 VITALS — BP 158/76 | HR 97 | Temp 98.7°F | Resp 16 | Wt 203.0 lb

## 2016-10-19 DIAGNOSIS — E890 Postprocedural hypothyroidism: Secondary | ICD-10-CM | POA: Diagnosis not present

## 2016-10-19 DIAGNOSIS — E785 Hyperlipidemia, unspecified: Secondary | ICD-10-CM

## 2016-10-19 DIAGNOSIS — K219 Gastro-esophageal reflux disease without esophagitis: Secondary | ICD-10-CM | POA: Diagnosis not present

## 2016-10-19 DIAGNOSIS — I10 Essential (primary) hypertension: Secondary | ICD-10-CM

## 2016-10-19 DIAGNOSIS — R739 Hyperglycemia, unspecified: Secondary | ICD-10-CM

## 2016-10-19 DIAGNOSIS — N183 Chronic kidney disease, stage 3 unspecified: Secondary | ICD-10-CM

## 2016-10-19 LAB — POCT GLYCOSYLATED HEMOGLOBIN (HGB A1C): Hemoglobin A1C: 4.9

## 2016-10-19 NOTE — Assessment & Plan Note (Addendum)
Controlled 50% of the time at home Work on weight loss, decrease sodium in diet, increase exercise Will refer to nephrology - medications may need to be revised

## 2016-10-19 NOTE — Assessment & Plan Note (Addendum)
Reviewed recent labs Will refer to nephrology Decrease pepsi and tea intake - increase water intake She avoids nsaids Stressed weight loss, low sodium diet

## 2016-10-19 NOTE — Assessment & Plan Note (Signed)
GERD controlled Continue daily medication  

## 2016-10-19 NOTE — Assessment & Plan Note (Signed)
Continue statin. 

## 2016-10-19 NOTE — Patient Instructions (Addendum)
BP goal is less than 130/80  Your  a1c was checked today.   All other Health Maintenance issues reviewed.   All recommended immunizations and age-appropriate screenings are up-to-date or discussed.  No immunizations administered today.   Medications reviewed and updated.  No changes recommended at this time.   A referral was ordered for a kidney specialist  Please followup in 6 months

## 2016-10-19 NOTE — Progress Notes (Signed)
Subjective:    Patient ID: Tina Molina, female    DOB: 1945/12/31, 71 y.o.   MRN: [id number]  HPI The patient is here for follow up.  Hypertension: She is taking her medication daily. She is compliant with a low sodium diet.  She denies chest pain, palpitations, shortness of breath and regular headaches. She is not exercising regularly.  She does monitor her blood pressure at home - 148/64, 147/71, 148/64, 111/56, 132/72, 112/70, 159/82, 128/70.    Hypothyroidism:  She is taking her medication daily.  She denies any recent changes in energy or weight that are unexplained.   GERD:  She is taking her medication daily as prescribed.  She denies any GERD symptoms and feels her GERD is well controlled.   Hyperlipidemia: She is taking her medication daily. She is compliant with a low fat/cholesterol diet. She is not exercising regularly.    CKD: She avoids nsaids.  She drinks about 6 glasses of water a day.  Regular pepsi every other day.  She drinks a lot of bottled green tea - lipton.   Hyperglycemia:  She is not exercising.  She is eating less sweets.  She drinks one pepsi about every other day.  She drinks bottled green tea.  She typically eats 2/day and will snack in the evening -chips.   Medications and allergies reviewed with patient and updated if appropriate.  Patient Active Problem List   Diagnosis Date Noted  . IDA (iron deficiency anemia) 04/06/2016  . Erythropoietin deficiency anemia 02/23/2016  . Chronic insomnia 08/12/2015  . GERD (gastroesophageal reflux disease) 06/25/2015  . Hyperlipidemia 01/13/2015  . Hyperglycemia 01/13/2015  . Circadian rhythm sleep disorder 08/14/2014  . Rheumatoid factor positive 02/20/2014  . Renal insufficiency 11/03/2013  . Thoracic radiculopathy 10/30/2013  . Circadian rhythm sleep disorder, shift work type 11/03/2012  . Hematuria 08/05/2012  . MGUS (monoclonal gammopathy of unknown significance) 08/03/2012  . Lumbar stenosis with  neurogenic claudication 02/29/2012  . Edema 09/13/2011  . Abnormal complete blood count 05/27/2011  . CKD (chronic kidney disease) stage 3, GFR 30-59 ml/min 12/19/2009  . HERNIATED LUMBAR DISC 12/19/2009  . PULMONARY SARCOIDOSIS 10/16/2009  . Hypothyroidism 10/16/2009  . Anemia associated with stage 2 chronic renal failure 10/16/2009  . CARPAL TUNNEL SYNDROME, BILATERAL 10/16/2009  . UVEITIS 10/16/2009  . Essential hypertension 10/16/2009  . ARTHRITIS 10/16/2009  . CERVICAL DISC DISORDER 10/16/2009    Current Outpatient Prescriptions on File Prior to Visit  Medication Sig Dispense Refill  . aspirin 81 MG tablet Take 81 mg by mouth daily.     Marland Kitchen atorvastatin (LIPITOR) 20 MG tablet Take 1 tablet (20 mg total) by mouth daily. 30 tablet 2  . carvedilol (COREG) 25 MG tablet take 1 tablet by mouth twice a day 180 tablet 1  . cyclobenzaprine (FLEXERIL) 10 MG tablet Take 10 mg by mouth 3 (three) times daily as needed for muscle spasms.    Marland Kitchen gabapentin (NEURONTIN) 300 MG capsule take 1 capsule by mouth every 8 hours if needed 90 capsule 1  . hydrALAZINE (APRESOLINE) 50 MG tablet take 1 tablet by mouth three times a day 270 tablet 0  . leflunomide (ARAVA) 20 MG tablet Take 20 mg by mouth daily.    Marland Kitchen levothyroxine (SYNTHROID, LEVOTHROID) 125 MCG tablet Take 1 tablet (125 mcg total) by mouth daily. 90 tablet 3  . losartan (COZAAR) 100 MG tablet take 1 tablet by mouth once daily 90 tablet 1  . Multiple Vitamin (  MULTIVITAMIN WITH MINERALS) TABS Take 1 tablet by mouth daily.    . ranitidine (ZANTAC) 150 MG tablet take 1 tablet by mouth twice a day 180 tablet 1  . spironolactone (ALDACTONE) 50 MG tablet take 1/2 tablet by mouth once daily 45 tablet 0  . SYMBICORT 160-4.5 MCG/ACT inhaler inhale 2 puffs if needed for wheezing 10.2 g 0  . vitamin E 400 UNIT capsule Take 400 Units by mouth daily.     Marland Kitchen zolpidem (AMBIEN) 10 MG tablet Take 1 tablet (10 mg total) by mouth at bedtime as needed for sleep. 90  tablet 1  . [DISCONTINUED] modafinil (PROVIGIL) 100 MG tablet Take 100 mg by mouth daily.    . [DISCONTINUED] potassium chloride (MICRO-K) 10 MEQ CR capsule Take 1 capsule (10 mEq total) by mouth 2 (two) times daily. 180 capsule 1   No current facility-administered medications on file prior to visit.     Past Medical History:  Diagnosis Date  . Anemia    Associated with leukopenia and lymphocytosis. This is been evaluated by Dr. Marin Olp . Her platelet count has been normal  . Arthritis   . Chronic back pain   . Chronic kidney disease   . Erythropoietin deficiency anemia 02/23/2016  . GERD (gastroesophageal reflux disease)   . Hypertension   . Lymphocytosis   . MGUS (monoclonal gammopathy of unknown significance) 08/03/2012   IgA 792 11/04/11  . Pneumonia 04/2010   OP  . Renal insufficiency   . Sarcoidosis ([place])   . Thyroid disease     Past Surgical History:  Procedure Laterality Date  . CARPAL TUNNEL RELEASE     Bilateral   . CATARACT EXTRACTION Right 05/2015  . CERVICAL DISCECTOMY  06/2010   Dr.Pool  . CHOLECYSTECTOMY    . COLONOSCOPY  2010  . POSTERIOR LUMBAR FUSION  02/29/2012   Dr Alyson Locket op respiratory & renal compromise  . THYROIDECTOMY  2003   For goiter    Social History   Social History  . Marital status: Legally Separated    Spouse name: N/A  . Number of children: 0  . Years of education: 12   Occupational History  . Retired Retired   Social History Main Topics  . Smoking status: Former Smoker    Packs/day: 0.50    Years: 20.00    Types: Cigarettes    Quit date: 05/17/1997  . Smokeless tobacco: Never Used  . Alcohol use 0.6 oz/week    1 Glasses of wine per week     Comment: occasionally   . Drug use: No  . Sexual activity: Not on file   Other Topics Concern  . Not on file   Social History Narrative   Patient is divorced and lives with her sister. Patient is retired.   Caffeine- sometimes - one cup   Right handed.    Family History    Problem Relation Age of Onset  . Anemia Father   . Arthritis Father   . Heart failure Father 58       No CAD  . Diabetes Mother   . Arthritis Mother   . Glaucoma Mother   . Heart disease Mother 22       No CAD  . Hypertension Brother   . Anemia Brother   . Kidney disease Brother   . Hypertension Sister   . Kidney disease Sister   . Anemia Sister   . Diabetes Maternal Aunt   . Arthritis Paternal Grandmother  Review of Systems  Constitutional: Positive for fatigue. Negative for chills and fever.  Respiratory: Negative for cough, shortness of breath and wheezing.   Cardiovascular: Positive for leg swelling. Negative for chest pain and palpitations.  Gastrointestinal: Negative for abdominal pain.  Musculoskeletal: Positive for back pain and joint swelling.  Neurological: Negative for light-headedness and headaches.       Objective:   Vitals:   10/19/16 1301  BP: (!) 158/76  Pulse: 97  Resp: 16  Temp: 98.7 F (37.1 C)   Wt Readings from Last 3 Encounters:  10/19/16 203 lb (92.1 kg)  10/06/16 202 lb 12.8 oz (92 kg)  07/07/16 208 lb (94.3 kg)   Body mass index is 34.84 kg/m.   Physical Exam    Constitutional: Appears well-developed and well-nourished. No distress.  HENT:  Head: Normocephalic and atraumatic.  Neck: Neck supple. No tracheal deviation present. No thyromegaly present.  No cervical lymphadenopathy Cardiovascular: Normal rate, regular rhythm and normal heart sounds.   No murmur heard. No carotid bruit .  Mild b/l LE, non pitting edema Pulmonary/Chest: Effort normal and breath sounds normal. No respiratory distress. No has no wheezes. No rales.  Skin: Skin is warm and dry. Not diaphoretic.  Psychiatric: Normal mood and affect. Behavior is normal.      Assessment & Plan:    See Problem List for Assessment and Plan of chronic medical problems.   FU in 6 months

## 2016-10-19 NOTE — Assessment & Plan Note (Addendum)
a1c today good, but has CKD so hard to know how reliable it is Continue to decrease sugar intake Work on increasing your activity/exercise Deferred nutrition referral

## 2016-10-19 NOTE — Assessment & Plan Note (Signed)
Lab Results  Component Value Date   TSH 2.14 06/15/2016    Continue current dose of medication

## 2016-10-21 ENCOUNTER — Ambulatory Visit (INDEPENDENT_AMBULATORY_CARE_PROVIDER_SITE_OTHER): Payer: Medicare Other | Admitting: Adult Health

## 2016-10-21 DIAGNOSIS — F5104 Psychophysiologic insomnia: Secondary | ICD-10-CM

## 2016-10-21 NOTE — Progress Notes (Signed)
I agree with the assessment and plan as directed by NP .The patient is known to me .   Hena Ewalt, MD  

## 2016-10-21 NOTE — Progress Notes (Signed)
PATIENT: Tina Molina DOB: 1946/01/21  REASON FOR VISIT: follow up HISTORY FROM: patient  HISTORY OF PRESENT ILLNESS: Today 10/21/2016 Tina Molina is a 71 year old female with a history of insomnia. She returns today for follow-up. She is currently taking Ambien 10 mg at bedtime. She states that with this medication she is able to obtain the most sleep. She states without taking Ambien she typically does not fall asleep at all. She has tried Unisom alternating with Ambien but reports Unisom was not beneficial. She was a shift worker for 30 years. She states that she typically cannot go to bed before 3 AM and usually will get up next day by 4 PM. She denies any new neurological symptoms. She returns today for evaluation.  HISTORY 10/22/2015 Copied from Dr. Edwena Felty note: Tina Molina is here today and is actually looking very well. She had a repeat sleep study on 09/02/2015 showing no apnea being present. The patient has a history of pulmonary sarcoidosis so another concern was that of possible hypoxemia but her desaturations overnight were minimal and she only had 2.5 minutes of total desaturation time. There was no organic reason for her insomnia being found and we resumed the diagnosis of former shift work disorder-circadian rhythm disorder. She is a chronic insomniac,  but her Epworth sleepiness score today is only endorsed at 4 points fatigue severity at 46 points and the geriatric depression score at 3 out of 15 points. She has been able to use Ambien 4 nights a week at the other nights Unisom and is doing well with this. Under the circumstances, I have no hesitation of providing the Ambien for her.     REVIEW OF SYSTEMS: Out of a complete 14 system review of symptoms, the patient complains only of the following symptoms, and all other reviewed systems are negative.  Wheezing, shortness of breath, frequency of urination, numbness, anemia  ALLERGIES: Allergies  Allergen Reactions    . Penicillins     Intolerance or allergy not remembered; her sister stated penicillin caused "boils under the skin"  . Shellfish Allergy Rash  . Sulfonamide Derivatives     Arthralgias; her sister stated that this caused a rash.  . Azithromycin     itching  . Codeine     Nausea only with liquid codeine  . Infliximab     Other reaction(s): Confusion (intolerance)    HOME MEDICATIONS: Outpatient Medications Prior to Visit  Medication Sig Dispense Refill  . aspirin 81 MG tablet Take 81 mg by mouth daily.     Marland Kitchen atorvastatin (LIPITOR) 20 MG tablet Take 1 tablet (20 mg total) by mouth daily. 30 tablet 2  . carvedilol (COREG) 25 MG tablet take 1 tablet by mouth twice a day 180 tablet 1  . cyclobenzaprine (FLEXERIL) 10 MG tablet Take 10 mg by mouth 3 (three) times daily as needed for muscle spasms.    Marland Kitchen gabapentin (NEURONTIN) 300 MG capsule take 1 capsule by mouth every 8 hours if needed 90 capsule 1  . hydrALAZINE (APRESOLINE) 50 MG tablet take 1 tablet by mouth three times a day 270 tablet 0  . leflunomide (ARAVA) 20 MG tablet Take 20 mg by mouth daily.    Marland Kitchen levothyroxine (SYNTHROID, LEVOTHROID) 125 MCG tablet Take 1 tablet (125 mcg total) by mouth daily. 90 tablet 3  . losartan (COZAAR) 100 MG tablet take 1 tablet by mouth once daily 90 tablet 1  . Multiple Vitamin (MULTIVITAMIN WITH MINERALS) TABS Take 1 tablet  by mouth daily.    . ranitidine (ZANTAC) 150 MG tablet take 1 tablet by mouth twice a day 180 tablet 1  . spironolactone (ALDACTONE) 50 MG tablet take 1/2 tablet by mouth once daily 45 tablet 0  . SYMBICORT 160-4.5 MCG/ACT inhaler inhale 2 puffs if needed for wheezing 10.2 g 0  . vitamin E 400 UNIT capsule Take 400 Units by mouth daily.     Marland Kitchen zolpidem (AMBIEN) 10 MG tablet Take 1 tablet (10 mg total) by mouth at bedtime as needed for sleep. 90 tablet 1   No facility-administered medications prior to visit.     PAST MEDICAL HISTORY: Past Medical History:  Diagnosis Date  .  Anemia    Associated with leukopenia and lymphocytosis. This is been evaluated by Dr. Marin Olp . Her platelet count has been normal  . Arthritis   . Chronic back pain   . Chronic kidney disease   . Erythropoietin deficiency anemia 02/23/2016  . GERD (gastroesophageal reflux disease)   . Hypertension   . Lymphocytosis   . MGUS (monoclonal gammopathy of unknown significance) 08/03/2012   IgA 792 11/04/11  . Pneumonia 04/2010   OP  . Renal insufficiency   . Sarcoidosis   . Thyroid disease     PAST SURGICAL HISTORY: Past Surgical History:  Procedure Laterality Date  . CARPAL TUNNEL RELEASE     Bilateral   . CATARACT EXTRACTION Right 05/2015  . CERVICAL DISCECTOMY  06/2010   Dr.Pool  . CHOLECYSTECTOMY    . COLONOSCOPY  2010  . POSTERIOR LUMBAR FUSION  02/29/2012   Dr Alyson Locket op respiratory & renal compromise  . THYROIDECTOMY  2003   For goiter    FAMILY HISTORY: Family History  Problem Relation Age of Onset  . Anemia Father   . Arthritis Father   . Heart failure Father 76       No CAD  . Diabetes Mother   . Arthritis Mother   . Glaucoma Mother   . Heart disease Mother 61       No CAD  . Hypertension Brother   . Anemia Brother   . Kidney disease Brother   . Hypertension Sister   . Kidney disease Sister   . Anemia Sister   . Diabetes Maternal Aunt   . Arthritis Paternal Grandmother     SOCIAL HISTORY: Social History   Social History  . Marital status: Legally Separated    Spouse name: N/A  . Number of children: 0  . Years of education: 12   Occupational History  . Retired Retired   Social History Main Topics  . Smoking status: Former Smoker    Packs/day: 0.50    Years: 20.00    Types: Cigarettes    Quit date: 05/17/1997  . Smokeless tobacco: Never Used  . Alcohol use 0.6 oz/week    1 Glasses of wine per week     Comment: occasionally   . Drug use: No  . Sexual activity: Not on file   Other Topics Concern  . Not on file   Social History Narrative    Patient is divorced and lives with her sister. Patient is retired.   Caffeine- sometimes - one cup   Right handed.      PHYSICAL EXAM  Vitals:   10/21/16 1347  BP: (!) 160/82  Pulse: 87  SpO2: 96%  Weight: 203 lb (92.1 kg)  Height: 5\' 4"  (1.626 m)   Body mass index is 34.84 kg/m.  Generalized:  Well developed, in no acute distress   Neurological examination  Mentation: Alert oriented to time, place, history taking. Follows all commands speech and language fluent Cranial nerve II-XII: Pupils were equal round reactive to light. Extraocular movements were full, visual field were full on confrontational test. Facial sensation and strength were normal. Uvula tongue midline. Head turning and shoulder shrug  were normal and symmetric. Motor: The motor testing reveals 5 over 5 strength of all 4 extremities. Good symmetric motor tone is noted throughout.  Sensory: Sensory testing is intact to soft touch on all 4 extremities. No evidence of extinction is noted.  Coordination: Cerebellar testing reveals good finger-nose-finger and heel-to-shin bilaterally.  Gait and station: Gait is normal. Tandem gait is slightly unsteady. Romberg is negative. No drift is seen.  Reflexes: Deep tendon reflexes are symmetric and normal bilaterally.   DIAGNOSTIC DATA (LABS, IMAGING, TESTING) - I reviewed patient records, labs, notes, testing and imaging myself where available.  Lab Results  Component Value Date   WBC 3.7 (L) 10/06/2016   HGB 10.3 (L) 10/06/2016   HCT 31.3 (L) 10/06/2016   MCV 96 10/06/2016   PLT 190 10/06/2016      Component Value Date/Time   NA 139 10/06/2016 1300   NA 143 04/05/2016 1156   K 4.1 10/06/2016 1300   K 3.8 04/05/2016 1156   CL 106 10/06/2016 1300   CO2 27 10/06/2016 1300   CO2 21 (L) 04/05/2016 1156   GLUCOSE 104 (H) 10/06/2016 1300   GLUCOSE 89 04/14/2016 1443   GLUCOSE 99 04/05/2016 1156   BUN 26 10/06/2016 1300   BUN 18.5 04/05/2016 1156   CREATININE  1.73 (H) 10/06/2016 1300   CREATININE 1.5 (H) 04/05/2016 1156   CALCIUM 8.9 10/06/2016 1300   CALCIUM 9.3 04/05/2016 1156   PROT 7.1 10/06/2016 1300   PROT 7.1 10/06/2016 1300   PROT 7.7 04/05/2016 1156   ALBUMIN 4.4 10/06/2016 1300   ALBUMIN 3.6 04/05/2016 1156   AST 20 10/06/2016 1300   AST 21 04/05/2016 1156   ALT 14 10/06/2016 1300   ALT 22 04/05/2016 1156   ALKPHOS 67 10/06/2016 1300   ALKPHOS 77 04/05/2016 1156   BILITOT 0.3 10/06/2016 1300   BILITOT 0.36 04/05/2016 1156   GFRNONAA 29 (L) 10/06/2016 1300   GFRAA 34 (L) 10/06/2016 1300   Lab Results  Component Value Date   CHOL 171 04/14/2016   HDL 66.00 04/14/2016   LDLCALC 85 04/14/2016   LDLDIRECT 156.5 05/27/2011   TRIG 102.0 04/14/2016   CHOLHDL 3 04/14/2016   Lab Results  Component Value Date   HGBA1C 4.9 10/19/2016   Lab Results  Component Value Date   TGGYIRSW54 627 04/24/2012   Lab Results  Component Value Date   TSH 2.14 06/15/2016      ASSESSMENT AND PLAN 71 y.o. year old female  has a past medical history of Anemia; Arthritis; Chronic back pain; Chronic kidney disease; Erythropoietin deficiency anemia (02/23/2016); GERD (gastroesophageal reflux disease); Hypertension; Lymphocytosis; MGUS (monoclonal gammopathy of unknown significance) (08/03/2012); Pneumonia (04/2010); Renal insufficiency; Sarcoidosis; and Thyroid disease. here with:  1. Insomnia  Overall the patient has remained stable. She will continue taking Ambien 10 mg at bedtime if needed for sleep. I have reviewed the side effects and potential risk associated with Ambien. Patient voiced understanding. She will follow-up in one year or sooner if needed.  I spent 15 minutes with the patient. 50% of this time was spent discussing side effects of Ambien with  the patient.      Ward Givens, MSN, NP-C 10/21/2016, 2:06 PM [facility] [address] [place], [place] [address] [phone]

## 2016-10-21 NOTE — Patient Instructions (Addendum)
Continue Ambien If your symptoms worsen or you develop new symptoms please let us know.   Zolpidem tablets What is this medicine? ZOLPIDEM (zole PI dem) is used to treat insomnia. This medicine helps you to fall asleep and sleep through the night. This medicine may be used for other purposes; ask your health care provider or pharmacist if you have questions. COMMON BRAND NAME(S): Ambien What should I tell my health care provider before I take this medicine? They need to know if you have any of these conditions: -depression -history of drug abuse or addiction -if you often drink alcohol -liver disease -lung or breathing disease -myasthenia gravis -sleep apnea -suicidal thoughts, plans, or attempt; a previous suicide attempt by you or a family member -an unusual or allergic reaction to zolpidem, other medicines, foods, dyes, or preservatives -pregnant or trying to get pregnant -breast-feeding How should I use this medicine? Take this medicine by mouth with a glass of water. Follow the directions on the prescription label. It is better to take this medicine on an empty stomach and only when you are ready for bed. Do not take your medicine more often than directed. If you have been taking this medicine for several weeks and suddenly stop taking it, you may get unpleasant withdrawal symptoms. Your doctor or health care professional may want to gradually reduce the dose. Do not stop taking this medicine on your own. Always follow your doctor or health care professional's advice. A special MedGuide will be given to you by the pharmacist with each prescription and refill. Be sure to read this information carefully each time. Talk to your pediatrician regarding the use of this medicine in children. Special care may be needed. Overdosage: If you think you have taken too much of this medicine contact a poison control center or emergency room at once. NOTE: This medicine is only for you. Do not share  this medicine with others. What if I miss a dose? This does not apply. This medicine should only be taken immediately before going to sleep. Do not take double or extra doses. What may interact with this medicine? -alcohol -antihistamines for allergy, cough and cold -certain medicines for anxiety or sleep -certain medicines for depression, like amitriptyline, fluoxetine, sertraline -certain medicines for fungal infections like ketoconazole and itraconazole -certain medicines for seizures like phenobarbital, primidone -ciprofloxacin -dietary supplements for sleep, like valerian or kava kava -general anesthetics like halothane, isoflurane, methoxyflurane, propofol -local anesthetics like lidocaine, pramoxine, tetracaine -medicines that relax muscles for surgery -narcotic medicines for pain -phenothiazines like chlorpromazine, mesoridazine, prochlorperazine, thioridazine -rifampin This list may not describe all possible interactions. Give your health care provider a list of all the medicines, herbs, non-prescription drugs, or dietary supplements you use. Also tell them if you smoke, drink alcohol, or use illegal drugs. Some items may interact with your medicine. What should I watch for while using this medicine? Visit your doctor or health care professional for regular checks on your progress. Keep a regular sleep schedule by going to bed at about the same time each night. Avoid caffeine-containing drinks in the evening hours. When sleep medicines are used every night for more than a few weeks, they may stop working. Talk to your doctor if you still have trouble sleeping. After taking this medicine for sleep, you may get up out of bed while not being fully awake and do an activity that you do not know you are doing. The next morning, you may have no memory of the event.  Activities such as driving a car ("sleep-driving"), making and eating food, talking on the phone, sexual activity, and  sleep-walking have been reported. Call your doctor right away if you find out you have done any of these activities. Do not take this medicine if you have used alcohol that evening or before bed or taken another medicine for sleep since your risk of doing these sleep-related activities will be increased. Wait for at least 8 hours after you take a dose before driving or doing other activities that require full mental alertness. Do not take this medicine unless you are able to stay in bed for a full night (7 to 8 hours) before you must be active again. You may have a decrease in mental alertness the day after use, even if you feel that you are fully awake. Tell your doctor if you will need to perform activities requiring full alertness, such as driving, the next day. Do not stand or sit up quickly after taking this medicine, especially if you are an older patient. This reduces the risk of dizzy or fainting spells. If you or your family notice any changes in your behavior, such as new or worsening depression, thoughts of harming yourself, anxiety, other unusual or disturbing thoughts, or memory loss, call your doctor right away. After you stop taking this medicine, you may have trouble falling asleep. This is called rebound insomnia. This problem usually goes away on its own after 1 or 2 nights. What side effects may I notice from receiving this medicine? Side effects that you should report to your doctor or health care professional as soon as possible: -allergic reactions like skin rash, itching or hives, swelling of the face, lips, or tongue -breathing problems -changes in vision -confusion -depressed mood or other changes in moods or emotions -feeling faint or lightheaded, falls -hallucinations -loss of balance or coordination -loss of memory -numbness or tingling of the tongue -restlessness, excitability, or feelings of anxiety or agitation -signs and symptoms of liver injury like dark yellow or  brown urine; general ill feeling or flu-like symptoms; light-colored stools; loss of appetite; nausea; right upper belly pain; unusually weak or tired; yellowing of the eyes or skin -suicidal thoughts -unusual activities while asleep like driving, eating, making phone calls, or sexual activity Side effects that usually do not require medical attention (report to your doctor or health care professional if they continue or are bothersome): -dizziness -drowsiness the day after you take this medicine -headache This list may not describe all possible side effects. Call your doctor for medical advice about side effects. You may report side effects to FDA at 1-800-FDA-1088. Where should I keep my medicine? Keep out of the reach of children. This medicine can be abused. Keep your medicine in a safe place to protect it from theft. Do not share this medicine with anyone. Selling or giving away this medicine is dangerous and against the law. This medicine may cause accidental overdose and death if taken by other adults, children, or pets. Mix any unused medicine with a substance like cat litter or coffee grounds. Then throw the medicine away in a sealed container like a sealed bag or a coffee can with a lid. Do not use the medicine after the expiration date. Store at room temperature between 20 and 25 degrees C (68 and 77 degrees F). NOTE: This sheet is a summary. It may not cover all possible information. If you have questions about this medicine, talk to your doctor, pharmacist, or health  care provider.  2018 Elsevier/Gold Standard (2015-08-06 14:38:20)

## 2016-10-28 ENCOUNTER — Other Ambulatory Visit: Payer: Self-pay | Admitting: Internal Medicine

## 2016-11-11 DIAGNOSIS — M7989 Other specified soft tissue disorders: Secondary | ICD-10-CM | POA: Diagnosis not present

## 2016-11-11 DIAGNOSIS — D860 Sarcoidosis of lung: Secondary | ICD-10-CM | POA: Diagnosis not present

## 2016-11-11 DIAGNOSIS — M5136 Other intervertebral disc degeneration, lumbar region: Secondary | ICD-10-CM | POA: Diagnosis not present

## 2016-11-11 DIAGNOSIS — E669 Obesity, unspecified: Secondary | ICD-10-CM | POA: Diagnosis not present

## 2016-11-11 DIAGNOSIS — N183 Chronic kidney disease, stage 3 (moderate): Secondary | ICD-10-CM | POA: Diagnosis not present

## 2016-11-11 DIAGNOSIS — M25511 Pain in right shoulder: Secondary | ICD-10-CM | POA: Diagnosis not present

## 2016-11-11 DIAGNOSIS — M0589 Other rheumatoid arthritis with rheumatoid factor of multiple sites: Secondary | ICD-10-CM | POA: Diagnosis not present

## 2016-11-11 DIAGNOSIS — M150 Primary generalized (osteo)arthritis: Secondary | ICD-10-CM | POA: Diagnosis not present

## 2016-11-11 DIAGNOSIS — Z6835 Body mass index (BMI) 35.0-35.9, adult: Secondary | ICD-10-CM | POA: Diagnosis not present

## 2016-11-19 ENCOUNTER — Other Ambulatory Visit: Payer: Self-pay | Admitting: Internal Medicine

## 2016-11-19 ENCOUNTER — Other Ambulatory Visit: Payer: Self-pay | Admitting: Neurology

## 2016-11-19 DIAGNOSIS — F5104 Psychophysiologic insomnia: Secondary | ICD-10-CM

## 2016-11-19 NOTE — Telephone Encounter (Signed)
Fax confirmation received [facility] Lorrin Mais [phone], sy

## 2016-12-08 ENCOUNTER — Ambulatory Visit: Payer: Medicare Other

## 2016-12-08 ENCOUNTER — Other Ambulatory Visit: Payer: Medicare Other

## 2016-12-08 ENCOUNTER — Ambulatory Visit: Payer: Medicare Other | Admitting: Hematology & Oncology

## 2016-12-13 ENCOUNTER — Other Ambulatory Visit: Payer: Self-pay | Admitting: Internal Medicine

## 2016-12-13 DIAGNOSIS — I10 Essential (primary) hypertension: Secondary | ICD-10-CM

## 2016-12-17 ENCOUNTER — Ambulatory Visit: Payer: Medicare Other

## 2016-12-17 ENCOUNTER — Other Ambulatory Visit (HOSPITAL_BASED_OUTPATIENT_CLINIC_OR_DEPARTMENT_OTHER): Payer: Medicare Other

## 2016-12-17 ENCOUNTER — Ambulatory Visit (HOSPITAL_BASED_OUTPATIENT_CLINIC_OR_DEPARTMENT_OTHER): Payer: Medicare Other | Admitting: Hematology & Oncology

## 2016-12-17 VITALS — BP 172/77 | HR 101 | Temp 98.7°F | Resp 19 | Wt 202.0 lb

## 2016-12-17 DIAGNOSIS — D500 Iron deficiency anemia secondary to blood loss (chronic): Secondary | ICD-10-CM

## 2016-12-17 DIAGNOSIS — D472 Monoclonal gammopathy: Secondary | ICD-10-CM | POA: Diagnosis not present

## 2016-12-17 DIAGNOSIS — N182 Chronic kidney disease, stage 2 (mild): Secondary | ICD-10-CM

## 2016-12-17 DIAGNOSIS — D631 Anemia in chronic kidney disease: Secondary | ICD-10-CM

## 2016-12-17 DIAGNOSIS — D509 Iron deficiency anemia, unspecified: Secondary | ICD-10-CM | POA: Diagnosis not present

## 2016-12-17 LAB — CMP (CANCER CENTER ONLY)
ALT(SGPT): 15 U/L (ref 10–47)
AST: 22 U/L (ref 11–38)
Albumin: 3.5 g/dL (ref 3.3–5.5)
Alkaline Phosphatase: 65 U/L (ref 26–84)
BUN, Bld: 17 mg/dL (ref 7–22)
CO2: 25 mEq/L (ref 18–33)
Calcium: 8.8 mg/dL (ref 8.0–10.3)
Chloride: 110 mEq/L — ABNORMAL HIGH (ref 98–108)
Creat: 1.6 mg/dl — ABNORMAL HIGH (ref 0.6–1.2)
Glucose, Bld: 107 mg/dL (ref 73–118)
Potassium: 3.4 mEq/L (ref 3.3–4.7)
Sodium: 140 mEq/L (ref 128–145)
Total Bilirubin: 0.5 mg/dl (ref 0.20–1.60)
Total Protein: 7.1 g/dL (ref 6.4–8.1)

## 2016-12-17 LAB — IRON AND TIBC
%SAT: 35 % (ref 21–57)
Iron: 91 ug/dL (ref 41–142)
TIBC: 263 ug/dL (ref 236–444)
UIBC: 171 ug/dL (ref 120–384)

## 2016-12-17 LAB — CBC WITH DIFFERENTIAL (CANCER CENTER ONLY)
BASO#: 0 10*3/uL (ref 0.0–0.2)
BASO%: 0.5 % (ref 0.0–2.0)
EOS%: 3.7 % (ref 0.0–7.0)
Eosinophils Absolute: 0.2 10*3/uL (ref 0.0–0.5)
HCT: 29.3 % — ABNORMAL LOW (ref 34.8–46.6)
HGB: 10 g/dL — ABNORMAL LOW (ref 11.6–15.9)
LYMPH#: 2.3 10*3/uL (ref 0.9–3.3)
LYMPH%: 57.1 % — ABNORMAL HIGH (ref 14.0–48.0)
MCH: 33.3 pg (ref 26.0–34.0)
MCHC: 34.1 g/dL (ref 32.0–36.0)
MCV: 98 fL (ref 81–101)
MONO#: 0.4 10*3/uL (ref 0.1–0.9)
MONO%: 10.7 % (ref 0.0–13.0)
NEUT#: 1.2 10*3/uL — ABNORMAL LOW (ref 1.5–6.5)
NEUT%: 28 % — ABNORMAL LOW (ref 39.6–80.0)
Platelets: 172 10*3/uL (ref 145–400)
RBC: 3 10*6/uL — ABNORMAL LOW (ref 3.70–5.32)
RDW: 13 % (ref 11.1–15.7)
WBC: 4.1 10*3/uL (ref 3.9–10.0)

## 2016-12-17 LAB — FERRITIN: Ferritin: 225 ng/ml (ref 9–269)

## 2016-12-17 MED ORDER — DARBEPOETIN ALFA 300 MCG/0.6ML IJ SOSY
PREFILLED_SYRINGE | INTRAMUSCULAR | Status: AC
  Filled 2016-12-17: qty 0.6
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
## 2016-12-18 LAB — IGG, IGA, IGM
IgA, Qn, Serum: 561 mg/dL — ABNORMAL HIGH (ref 87–352)
IgG, Qn, Serum: 893 mg/dL (ref 700–1600)
IgM, Qn, Serum: 30 mg/dL (ref 26–217)

## 2016-12-18 LAB — RETICULOCYTES: Reticulocyte Count: 1.1 % (ref 0.6–2.6)

## 2016-12-20 LAB — KAPPA/LAMBDA LIGHT CHAINS
Ig Kappa Free Light Chain: 17 mg/L (ref 3.3–19.4)
Ig Lambda Free Light Chain: 35 mg/L — ABNORMAL HIGH (ref 5.7–26.3)
Kappa/Lambda FluidC Ratio: 0.49 (ref 0.26–1.65)

## 2016-12-21 LAB — PROTEIN ELECTROPHORESIS, SERUM, WITH REFLEX
A/G Ratio: 1.3 (ref 0.7–1.7)
Albumin: 3.8 g/dL (ref 2.9–4.4)
Alpha 1: 0.2 g/dL (ref 0.0–0.4)
Alpha 2: 0.6 g/dL (ref 0.4–1.0)
Beta: 1.3 g/dL (ref 0.7–1.3)
Gamma Globulin: 0.8 g/dL (ref 0.4–1.8)
Globulin, Total: 2.9 g/dL (ref 2.2–3.9)
Interpretation(See Below): 0
M-Spike, %: 0.2 g/dL — ABNORMAL HIGH
Total Protein: 6.7 g/dL (ref 6.0–8.5)

## 2016-12-30 DIAGNOSIS — I129 Hypertensive chronic kidney disease with stage 1 through stage 4 chronic kidney disease, or unspecified chronic kidney disease: Secondary | ICD-10-CM | POA: Diagnosis not present

## 2016-12-30 DIAGNOSIS — D860 Sarcoidosis of lung: Secondary | ICD-10-CM | POA: Diagnosis not present

## 2016-12-30 DIAGNOSIS — D631 Anemia in chronic kidney disease: Secondary | ICD-10-CM | POA: Diagnosis not present

## 2016-12-30 DIAGNOSIS — R319 Hematuria, unspecified: Secondary | ICD-10-CM | POA: Diagnosis not present

## 2016-12-30 DIAGNOSIS — N183 Chronic kidney disease, stage 3 (moderate): Secondary | ICD-10-CM | POA: Diagnosis not present

## 2016-12-30 DIAGNOSIS — Z6834 Body mass index (BMI) 34.0-34.9, adult: Secondary | ICD-10-CM | POA: Diagnosis not present

## 2016-12-30 DIAGNOSIS — D472 Monoclonal gammopathy: Secondary | ICD-10-CM | POA: Diagnosis not present

## 2017-01-04 ENCOUNTER — Other Ambulatory Visit: Payer: Self-pay | Admitting: Nephrology

## 2017-01-04 DIAGNOSIS — N183 Chronic kidney disease, stage 3 unspecified: Secondary | ICD-10-CM

## 2017-01-12 ENCOUNTER — Ambulatory Visit
Admission: RE | Admit: 2017-01-12 | Discharge: 2017-01-12 | Disposition: A | Discharge status: Home / Self Care | Payer: Medicare Other | Source: Ambulatory Visit | Attending: Nephrology | Admitting: Nephrology

## 2017-01-12 DIAGNOSIS — N183 Chronic kidney disease, stage 3 unspecified: Secondary | ICD-10-CM

## 2017-01-19 ENCOUNTER — Ambulatory Visit: Payer: Medicare Other | Admitting: Family

## 2017-01-19 ENCOUNTER — Other Ambulatory Visit: Payer: Medicare Other

## 2017-01-19 ENCOUNTER — Ambulatory Visit: Payer: Medicare Other

## 2017-01-20 ENCOUNTER — Other Ambulatory Visit: Payer: Self-pay | Admitting: Internal Medicine

## 2017-01-26 ENCOUNTER — Other Ambulatory Visit: Payer: Medicare Other

## 2017-01-26 ENCOUNTER — Ambulatory Visit: Payer: Medicare Other | Admitting: Family

## 2017-01-26 ENCOUNTER — Ambulatory Visit: Payer: Medicare Other

## 2017-01-31 ENCOUNTER — Ambulatory Visit: Payer: Medicare Other

## 2017-01-31 ENCOUNTER — Ambulatory Visit: Payer: Medicare Other | Admitting: Family

## 2017-01-31 ENCOUNTER — Other Ambulatory Visit: Payer: Medicare Other

## 2017-03-01 ENCOUNTER — Other Ambulatory Visit (HOSPITAL_BASED_OUTPATIENT_CLINIC_OR_DEPARTMENT_OTHER): Payer: Medicare Other

## 2017-03-01 ENCOUNTER — Ambulatory Visit (HOSPITAL_BASED_OUTPATIENT_CLINIC_OR_DEPARTMENT_OTHER): Payer: Medicare Other | Admitting: Family

## 2017-03-01 ENCOUNTER — Ambulatory Visit (HOSPITAL_BASED_OUTPATIENT_CLINIC_OR_DEPARTMENT_OTHER): Payer: Medicare Other

## 2017-03-01 VITALS — BP 122/61 | HR 94 | Temp 98.2°F | Resp 16 | Wt 202.0 lb

## 2017-03-01 DIAGNOSIS — D472 Monoclonal gammopathy: Secondary | ICD-10-CM

## 2017-03-01 DIAGNOSIS — D631 Anemia in chronic kidney disease: Secondary | ICD-10-CM | POA: Diagnosis not present

## 2017-03-01 DIAGNOSIS — D508 Other iron deficiency anemias: Secondary | ICD-10-CM | POA: Diagnosis not present

## 2017-03-01 DIAGNOSIS — N182 Chronic kidney disease, stage 2 (mild): Secondary | ICD-10-CM | POA: Diagnosis not present

## 2017-03-01 LAB — CBC WITH DIFFERENTIAL (CANCER CENTER ONLY)
BASO#: 0 10*3/uL (ref 0.0–0.2)
BASO%: 0.5 % (ref 0.0–2.0)
EOS%: 4.2 % (ref 0.0–7.0)
Eosinophils Absolute: 0.2 10*3/uL (ref 0.0–0.5)
HCT: 29.6 % — ABNORMAL LOW (ref 34.8–46.6)
HGB: 9.9 g/dL — ABNORMAL LOW (ref 11.6–15.9)
LYMPH#: 2.2 10*3/uL (ref 0.9–3.3)
LYMPH%: 54.4 % — ABNORMAL HIGH (ref 14.0–48.0)
MCH: 33.6 pg (ref 26.0–34.0)
MCHC: 33.4 g/dL (ref 32.0–36.0)
MCV: 100 fL (ref 81–101)
MONO#: 0.5 10*3/uL (ref 0.1–0.9)
MONO%: 11.7 % (ref 0.0–13.0)
NEUT#: 1.2 10*3/uL — ABNORMAL LOW (ref 1.5–6.5)
NEUT%: 29.2 % — ABNORMAL LOW (ref 39.6–80.0)
Platelets: 182 10*3/uL (ref 145–400)
RBC: 2.95 10*6/uL — ABNORMAL LOW (ref 3.70–5.32)
RDW: 12.5 % (ref 11.1–15.7)
WBC: 4 10*3/uL (ref 3.9–10.0)

## 2017-03-01 LAB — CMP (CANCER CENTER ONLY)
ALT(SGPT): 18 U/L (ref 10–47)
AST: 23 U/L (ref 11–38)
Albumin: 3.7 g/dL (ref 3.3–5.5)
Alkaline Phosphatase: 55 U/L (ref 26–84)
BUN, Bld: 19 mg/dL (ref 7–22)
CO2: 28 mEq/L (ref 18–33)
Calcium: 9.7 mg/dL (ref 8.0–10.3)
Chloride: 109 mEq/L — ABNORMAL HIGH (ref 98–108)
Creat: 1.8 mg/dl — ABNORMAL HIGH (ref 0.6–1.2)
Glucose, Bld: 111 mg/dL (ref 73–118)
Potassium: 4 mEq/L (ref 3.3–4.7)
Sodium: 150 mEq/L — ABNORMAL HIGH (ref 128–145)
Total Bilirubin: 0.5 mg/dl (ref 0.20–1.60)
Total Protein: 7.6 g/dL (ref 6.4–8.1)

## 2017-03-01 MED ORDER — DARBEPOETIN ALFA 300 MCG/0.6ML IJ SOSY
300.0000 ug | PREFILLED_SYRINGE | Freq: Once | INTRAMUSCULAR | Status: AC
  Administered 2017-03-01: 300 ug via SUBCUTANEOUS

## 2017-03-01 MED ORDER — DARBEPOETIN ALFA 300 MCG/0.6ML IJ SOSY
PREFILLED_SYRINGE | INTRAMUSCULAR | Status: AC
  Filled 2017-03-01: qty 0.6

## 2017-03-01 NOTE — Patient Instructions (Signed)

## 2017-03-01 NOTE — Progress Notes (Signed)
Hematology and Oncology Follow Up Visit  Tina Molina [id number] [birth year] 70 y.o. 03/01/2017   Principle Diagnosis:  Anemia of chronic renal failure - stage II MGUS - IgA lambda Iron deficiency anemia   Current Therapy:   Aranesp 300 mcg SQ to keep Hgb > 11 IV iron as indicated  - last received in December 2017    Interim History:  Tina Molina is here today for follow-up. She is feeling a little fatigue. Hgb is 9.9 with an MCV of 100. Platelet count is 182. She will get Aranesp today.  Her iron studies have been stable for quite a while and she has not required IV iron since December 2017.   Her M-spike in August was 0.2 g/dL, IgA level was 561 mg/dL and lambda light chain was 35.0 mg/L.  She has had no issue with infections. No fever, chills, n/v, cough, rash, dizziness, SOB, chest pain, palpitations, abdominal pain or changes in bowel or bladder habits.  She denies an bleeding, bruising or petechiae. No lymphadenopathy found on exam.  No swelling or tenderness in her extremities. The numbness and tingling in her toes is unchanged since a previous back surgery.  She has maintained a good appetite and is staying hydrated. Her weight is stable.   ECOG Performance Status: 1 - Symptomatic but completely ambulatory  Medications:  Allergies as of 03/01/2017      Reactions   Penicillins    Intolerance or allergy not remembered; her sister stated penicillin caused "boils under the skin"   Shellfish Allergy Rash   Sulfonamide Derivatives    Arthralgias; her sister stated that this caused a rash.   Azithromycin    itching   Codeine    Nausea only with liquid codeine   Infliximab    Other reaction(s): Confusion (intolerance)      Medication List       Accurate as of 03/01/17  1:14 PM. Always use your most recent med list.          aspirin 81 MG tablet Take 81 mg by mouth daily.   atorvastatin 20 MG tablet Commonly known as:  LIPITOR take 1 tablet by mouth once daily   carvedilol 25 MG tablet Commonly known as:  COREG take 1 tablet by mouth twice a day   cyclobenzaprine 10 MG tablet Commonly known as:  FLEXERIL Take 10 mg by mouth 3 (three) times daily as needed for muscle spasms.   gabapentin 300 MG capsule Commonly known as:  NEURONTIN take 1 capsule by mouth every 8 hours if needed   hydrALAZINE 50 MG tablet Commonly known as:  APRESOLINE take 1 tablet by mouth three times a day   leflunomide 20 MG tablet Commonly known as:  ARAVA Take 20 mg by mouth daily.   levothyroxine 125 MCG tablet Commonly known as:  SYNTHROID, LEVOTHROID Take 1 tablet (125 mcg total) by mouth daily.   losartan 100 MG tablet Commonly known as:  COZAAR take 1 tablet by mouth once daily   multivitamin with minerals Tabs tablet Take 1 tablet by mouth daily.   ranitidine 150 MG tablet Commonly known as:  ZANTAC take 1 tablet by mouth twice a day   spironolactone 50 MG tablet Commonly known as:  ALDACTONE take 1/2 tablet by mouth once daily   SYMBICORT 160-4.5 MCG/ACT inhaler Generic drug:  budesonide-formoterol inhale 2 puffs if needed for wheezing   vitamin E 400 UNIT capsule Take 400 Units by mouth daily.   zolpidem 10 MG  tablet Commonly known as:  AMBIEN take 1 tablet by mouth at bedtime if needed for sleep       Allergies:  Allergies  Allergen Reactions  . Penicillins     Intolerance or allergy not remembered; her sister stated penicillin caused "boils under the skin"  . Shellfish Allergy Rash  . Sulfonamide Derivatives     Arthralgias; her sister stated that this caused a rash.  . Azithromycin     itching  . Codeine     Nausea only with liquid codeine  . Infliximab     Other reaction(s): Confusion (intolerance)    Past Medical History, Surgical history, Social history, and Family History were reviewed and updated.  Review of Systems: All other 10 point review of systems is negative.   Physical Exam:  vitals were not taken for  this visit.  Wt Readings from Last 3 Encounters:  12/17/16 202 lb (91.6 kg)  10/21/16 203 lb (92.1 kg)  10/19/16 203 lb (92.1 kg)    Ocular: Sclerae unicteric, pupils equal, round and reactive to light Ear-nose-throat: Oropharynx clear, dentition fair Lymphatic: No cervical, supraclavicular or axillary adenopathy Lungs no rales or rhonchi, good excursion bilaterally Heart regular rate and rhythm, no murmur appreciated Abd soft, nontender, positive bowel sounds, no liver or spleen tip palpated on exam, no fluid wave MSK no focal spinal tenderness, no joint edema Neuro: non-focal, well-oriented, appropriate affect Breasts: Deferred   Lab Results  Component Value Date   WBC 4.1 12/17/2016   HGB 10.0 (L) 12/17/2016   HCT 29.3 (L) 12/17/2016   MCV 98 12/17/2016   PLT 172 12/17/2016   Lab Results  Component Value Date   FERRITIN 225 12/17/2016   IRON 91 12/17/2016   TIBC 263 12/17/2016   UIBC 171 12/17/2016   IRONPCTSAT 35 12/17/2016   Lab Results  Component Value Date   RETICCTPCT 1.5 01/03/2013   RBC 3.00 (L) 12/17/2016   RETICCTABS 51.6 01/03/2013   Lab Results  Component Value Date   KPAFRELGTCHN 1.97 (H) 01/03/2013   LAMBDASER 5.55 (H) 01/03/2013   KAPLAMBRATIO 0.49 12/17/2016   Lab Results  Component Value Date   IGGSERUM 893 12/17/2016   IGA 661 (H) 01/03/2013   IGMSERUM 30 12/17/2016   Lab Results  Component Value Date   TOTALPROTELP 7.2 01/03/2013   ALBUMINELP 57.9 11/04/2011   A1GS 3.1 11/04/2011   A2GS 8.9 11/04/2011   BETS 5.9 11/04/2011   BETA2SER 10.3 (H) 11/04/2011   GAMS 13.9 11/04/2011   MSPIKE 0.2 (H) 12/17/2016   SPEI * 11/04/2011     Chemistry      Component Value Date/Time   NA 140 12/17/2016 1201   NA 143 04/05/2016 1156   K 3.4 12/17/2016 1201   K 3.8 04/05/2016 1156   CL 110 (H) 12/17/2016 1201   CO2 25 12/17/2016 1201   CO2 21 (L) 04/05/2016 1156   BUN 17 12/17/2016 1201   BUN 18.5 04/05/2016 1156   CREATININE 1.6 (H)  12/17/2016 1201   CREATININE 1.5 (H) 04/05/2016 1156      Component Value Date/Time   CALCIUM 8.8 12/17/2016 1201   CALCIUM 9.3 04/05/2016 1156   ALKPHOS 65 12/17/2016 1201   ALKPHOS 77 04/05/2016 1156   AST 22 12/17/2016 1201   AST 21 04/05/2016 1156   ALT 15 12/17/2016 1201   ALT 22 04/05/2016 1156   BILITOT 0.50 12/17/2016 1201   BILITOT 0.36 04/05/2016 1156      Impression and Plan:  Tina Molina is a very pleasant 71 yo African American female with history multifactorial anemia and an IgA lambda MGUS.  Her protein and iron studies have remained stable so far. Hgb is 9.9 and BP is 122/61 with HR 94. We will proceed with Aranesp today as planned.  We will see what her iron studies show and bring her back in later this week for an infusion if needed.  We will plan to see her back again in another 4 months for repeat lab work and follow-up.  She will contact our office with any questions or concerns. We can certainly see her sooner if need be.   Eliezer Bottom, NP 10/16/20181:14 PM

## 2017-03-02 LAB — IRON AND TIBC
%SAT: 29 % (ref 21–57)
Iron: 82 ug/dL (ref 41–142)
TIBC: 280 ug/dL (ref 236–444)
UIBC: 199 ug/dL (ref 120–384)

## 2017-03-02 LAB — RETICULOCYTES: Reticulocyte Count: 1.6 % (ref 0.6–2.6)

## 2017-03-02 LAB — FERRITIN: Ferritin: 191 ng/ml (ref 9–269)

## 2017-03-07 DIAGNOSIS — H40013 Open angle with borderline findings, low risk, bilateral: Secondary | ICD-10-CM | POA: Diagnosis not present

## 2017-03-07 DIAGNOSIS — H11433 Conjunctival hyperemia, bilateral: Secondary | ICD-10-CM | POA: Diagnosis not present

## 2017-03-07 DIAGNOSIS — H04123 Dry eye syndrome of bilateral lacrimal glands: Secondary | ICD-10-CM | POA: Diagnosis not present

## 2017-03-17 DIAGNOSIS — M5415 Radiculopathy, thoracolumbar region: Secondary | ICD-10-CM | POA: Diagnosis not present

## 2017-03-25 ENCOUNTER — Other Ambulatory Visit: Payer: Self-pay | Admitting: Internal Medicine

## 2017-03-26 ENCOUNTER — Other Ambulatory Visit: Payer: Self-pay | Admitting: Internal Medicine

## 2017-04-10 ENCOUNTER — Other Ambulatory Visit: Payer: Self-pay | Admitting: Internal Medicine

## 2017-04-10 DIAGNOSIS — E039 Hypothyroidism, unspecified: Secondary | ICD-10-CM

## 2017-04-19 NOTE — Progress Notes (Signed)
error 

## 2017-04-20 ENCOUNTER — Encounter: Payer: Self-pay | Admitting: Internal Medicine

## 2017-04-20 ENCOUNTER — Ambulatory Visit: Payer: Medicare Other | Admitting: Internal Medicine

## 2017-04-20 ENCOUNTER — Other Ambulatory Visit (INDEPENDENT_AMBULATORY_CARE_PROVIDER_SITE_OTHER): Payer: Medicare Other

## 2017-04-20 ENCOUNTER — Ambulatory Visit (INDEPENDENT_AMBULATORY_CARE_PROVIDER_SITE_OTHER): Payer: Medicare Other | Admitting: Internal Medicine

## 2017-04-20 VITALS — BP 168/76 | HR 101 | Temp 98.2°F | Resp 16 | Wt 206.0 lb

## 2017-04-20 DIAGNOSIS — Z23 Encounter for immunization: Secondary | ICD-10-CM

## 2017-04-20 DIAGNOSIS — I10 Essential (primary) hypertension: Secondary | ICD-10-CM

## 2017-04-20 DIAGNOSIS — R739 Hyperglycemia, unspecified: Secondary | ICD-10-CM

## 2017-04-20 DIAGNOSIS — K219 Gastro-esophageal reflux disease without esophagitis: Secondary | ICD-10-CM

## 2017-04-20 DIAGNOSIS — E7849 Other hyperlipidemia: Secondary | ICD-10-CM

## 2017-04-20 DIAGNOSIS — E890 Postprocedural hypothyroidism: Secondary | ICD-10-CM

## 2017-04-20 DIAGNOSIS — Z0000 Encounter for general adult medical examination without abnormal findings: Secondary | ICD-10-CM

## 2017-04-20 DIAGNOSIS — M549 Dorsalgia, unspecified: Secondary | ICD-10-CM | POA: Diagnosis not present

## 2017-04-20 DIAGNOSIS — R600 Localized edema: Secondary | ICD-10-CM | POA: Diagnosis not present

## 2017-04-20 LAB — COMPREHENSIVE METABOLIC PANEL
ALT: 17 U/L (ref 0–35)
AST: 19 U/L (ref 0–37)
Albumin: 4.6 g/dL (ref 3.5–5.2)
Alkaline Phosphatase: 55 U/L (ref 39–117)
BUN: 19 mg/dL (ref 6–23)
CO2: 28 mEq/L (ref 19–32)
Calcium: 9 mg/dL (ref 8.4–10.5)
Chloride: 108 mEq/L (ref 96–112)
Creatinine, Ser: 1.47 mg/dL — ABNORMAL HIGH (ref 0.40–1.20)
GFR: 45.06 mL/min — ABNORMAL LOW (ref >60.00)
Glucose, Bld: 96 mg/dL (ref 70–99)
Potassium: 4.4 mEq/L (ref 3.5–5.1)
Sodium: 143 mEq/L (ref 135–145)
Total Bilirubin: 0.4 mg/dL (ref 0.2–1.2)
Total Protein: 7.7 g/dL (ref 6.0–8.3)

## 2017-04-20 LAB — LIPID PANEL
Cholesterol: 171 mg/dL (ref 0–200)
HDL: 70.6 mg/dL (ref >39.00)
LDL Cholesterol: 83 mg/dL (ref 0–99)
NonHDL: 99.9
Total CHOL/HDL Ratio: 2
Triglycerides: 83 mg/dL (ref 0.0–149.0)
VLDL: 16.6 mg/dL (ref 0.0–40.0)

## 2017-04-20 LAB — TSH: TSH: 7.64 u[IU]/mL — ABNORMAL HIGH (ref 0.35–4.50)

## 2017-04-20 LAB — HEMOGLOBIN A1C: Hgb A1c MFr Bld: 5.1 % (ref 4.6–6.5)

## 2017-04-20 NOTE — Assessment & Plan Note (Signed)
Check A1c Encouraged regular exercise

## 2017-04-20 NOTE — Progress Notes (Signed)
Subjective:   Tina Molina is a 71 y.o. female who presents for Medicare Annual (Subsequent) preventive examination.  Review of Systems:  No ROS.  Medicare Wellness Visit. Additional risk factors are reflected in the social history.  Cardiac Risk Factors include: advanced age (>23men, >22 women);dyslipidemia;hypertension;obesity (BMI >30kg/m2);sedentary lifestyle Sleep patterns: has interrupted sleep, gets up 2-3 times nightly to void and sleeps 5-6 hours nightly.  Patient reports insomnia issues, discussed recommended sleep tips.   Home Safety/Smoke Alarms: Feels safe in home. Smoke alarms in place.  Living environment; residence and Firearm Safety: 1-story house/ trailer, no firearms. Lives with sister, no needs for DME, good support system Seat Belt Safety/Bike Helmet: Wears seat belt.     Objective:     Vitals: BP (!) 168/76   Pulse (!) 101   Temp 98.2 F (36.8 C) (Oral)   Resp 16   Wt 206 lb (93.4 kg)   SpO2 96%   BMI 35.36 kg/m   Body mass index is 35.36 kg/m.  Advanced Directives 04/20/2017 12/17/2016 10/06/2016 07/07/2016 04/20/2016 04/05/2016 03/22/2016  Does Patient Have a Medical Advance Directive? Yes No No No No No No  Does patient want to make changes to medical advance directive? Yes (ED - Information included in AVS) - - - - - -  Would patient like information on creating a medical advance directive? - - - No - Patient declined No - Patient declined - No - patient declined information  Pre-existing out of facility DNR order (yellow form or pink MOST form) - - - - - - -    Tobacco Social History   Tobacco Use  Smoking Status Former Smoker  . Packs/day: 0.50  . Years: 20.00  . Pack years: 10.00  . Types: Cigarettes  . Last attempt to quit: 05/17/1997  . Years since quitting: 19.9  Smokeless Tobacco Never Used     Counseling given: Not Answered  Past Medical History:  Diagnosis Date  . Anemia    Associated with leukopenia and lymphocytosis. This is been  evaluated by Dr. Marin Olp . Her platelet count has been normal  . Arthritis   . Chronic back pain   . Chronic kidney disease   . Erythropoietin deficiency anemia 02/23/2016  . GERD (gastroesophageal reflux disease)   . Hypertension   . Lymphocytosis   . MGUS (monoclonal gammopathy of unknown significance) 08/03/2012   IgA 792 11/04/11  . Pneumonia 04/2010   OP  . Renal insufficiency   . Sarcoidosis   . Thyroid disease    Past Surgical History:  Procedure Laterality Date  . CARPAL TUNNEL RELEASE     Bilateral   . CATARACT EXTRACTION Right 05/2015  . CERVICAL DISCECTOMY  06/2010   Dr.Pool  . CHOLECYSTECTOMY    . COLONOSCOPY  2010  . POSTERIOR LUMBAR FUSION  02/29/2012   Dr Alyson Locket op respiratory & renal compromise  . THYROIDECTOMY  2003   For goiter   Family History  Problem Relation Age of Onset  . Anemia Father   . Arthritis Father   . Heart failure Father 39       No CAD  . Diabetes Mother   . Arthritis Mother   . Glaucoma Mother   . Heart disease Mother 80       No CAD  . Hypertension Brother   . Anemia Brother   . Kidney disease Brother   . Hypertension Sister   . Kidney disease Sister   . Anemia Sister   .  Diabetes Maternal Aunt   . Arthritis Paternal Grandmother    Social History   Socioeconomic History  . Marital status: Divorced    Spouse name: None  . Number of children: 0  . Years of education: 13  . Highest education level: None  Social Needs  . Financial resource strain: None  . Food insecurity - worry: None  . Food insecurity - inability: None  . Transportation needs - medical: None  . Transportation needs - non-medical: None  Occupational History  . Occupation: Retired    Fish farm manager: RETIRED  Tobacco Use  . Smoking status: Former Smoker    Packs/day: 0.50    Years: 20.00    Pack years: 10.00    Types: Cigarettes    Last attempt to quit: 05/17/1997    Years since quitting: 19.9  . Smokeless tobacco: Never Used  Substance and Sexual  Activity  . Alcohol use: Yes    Alcohol/week: 0.6 oz    Types: 1 Glasses of Loetta Connelley per week    Comment: occasionally   . Drug use: No  . Sexual activity: None  Other Topics Concern  . None  Social History Narrative   Patient is divorced and lives with her sister. Patient is retired.   Caffeine- sometimes - one cup   Right handed.    Outpatient Encounter Medications as of 04/20/2017  Medication Sig  . aspirin 81 MG tablet Take 81 mg by mouth daily.   Marland Kitchen atorvastatin (LIPITOR) 20 MG tablet take 1 tablet by mouth once daily  . carvedilol (COREG) 25 MG tablet take 1 tablet by mouth twice a day  . cyclobenzaprine (FLEXERIL) 10 MG tablet Take 10 mg by mouth 3 (three) times daily as needed for muscle spasms.  Marland Kitchen gabapentin (NEURONTIN) 300 MG capsule take 1 capsule by mouth every 8 hours if needed  . hydrALAZINE (APRESOLINE) 50 MG tablet take 1 tablet by mouth three times a day  . leflunomide (ARAVA) 20 MG tablet Take 20 mg by mouth daily.  Marland Kitchen levothyroxine (SYNTHROID, LEVOTHROID) 125 MCG tablet take 1 tablet by mouth once daily  . losartan (COZAAR) 100 MG tablet take 1 tablet by mouth once daily  . Multiple Vitamin (MULTIVITAMIN WITH MINERALS) TABS Take 1 tablet by mouth daily.  . ranitidine (ZANTAC) 150 MG tablet take 1 tablet by mouth twice a day  . spironolactone (ALDACTONE) 50 MG tablet take 1/2 tablet by mouth once daily  . SYMBICORT 160-4.5 MCG/ACT inhaler inhale 2 puffs if needed for wheezing  . vitamin E 400 UNIT capsule Take 400 Units by mouth daily.   Marland Kitchen zolpidem (AMBIEN) 10 MG tablet take 1 tablet by mouth at bedtime if needed for sleep   No facility-administered encounter medications on file as of 04/20/2017.     Activities of Daily Living In your present state of health, do you have any difficulty performing the following activities: 04/20/2017  Hearing? N  Vision? N  Difficulty concentrating or making decisions? N  Walking or climbing stairs? N  Dressing or bathing? N  Doing  errands, shopping? N  Preparing Food and eating ? N  Using the Toilet? N  In the past six months, have you accidently leaked urine? N  Do you have problems with loss of bowel control? N  Managing your Medications? N  Managing your Finances? N  Housekeeping or managing your Housekeeping? N  Some recent data might be hidden    Patient Care Team: Binnie Rail, MD as PCP -  General (Internal Medicine)    Assessment:    Physical assessment deferred to PCP.  Exercise Activities and Dietary recommendations Current Exercise Habits: The patient does not participate in regular exercise at present, Exercise limited by: None identified  Diet (meal preparation, eat out, water intake, caffeinated beverages, dairy products, fruits and vegetables): in general, a "healthy" diet     Reviewed heart healthy diet, encouraged patient to increase daily water intake. Diet education was provided via handout.  Goals    . Patient Stated     Increase my physical activity. I can start to do the elliptical 3 times weekly for 5 minutes.      Fall Risk Fall Risk  04/20/2017 04/20/2017 07/07/2016 04/20/2016 04/05/2016  Falls in the past year? No Yes No No No  Number falls in past yr: 1 1 - - -  Injury with Fall? No No - - -  Risk for fall due to : - - - - -  Risk for fall due to: Comment - - - - -  Follow up - - - - -    Depression Screen PHQ 2/9 Scores 04/20/2017 04/20/2017 12/31/2015 08/20/2013  PHQ - 2 Score 2 0 0 0  PHQ- 9 Score 4 - - -     Cognitive Function MMSE - Mini Mental State Exam 04/20/2017 12/31/2015  Not completed: - (No Data)  Orientation to time 5 -  Orientation to Place 5 -  Registration 3 -  Attention/ Calculation 5 -  Recall 1 -  Language- name 2 objects 2 -  Language- repeat 1 -  Language- follow 3 step command 3 -  Language- read & follow direction 1 -  Write a sentence 1 -  Copy design 1 -  Total score 28 -        Immunization History  Administered Date(s) Administered    . Influenza Whole 01/20/2010  . Influenza, High Dose Seasonal PF 03/01/2013, 03/13/2015, 04/20/2017  . Influenza,inj,Quad PF,6+ Mos 04/05/2016  . Pneumococcal Conjugate-13 01/13/2015  . Pneumococcal Polysaccharide-23 05/30/2013  . Td 06/01/2009   Screening Tests Health Maintenance  Topic Date Due  . DEXA SCAN  05/29/2013  . COLONOSCOPY  10/03/2017  . MAMMOGRAM  04/30/2018  . TETANUS/TDAP  06/02/2019  . INFLUENZA VACCINE  Completed  . Hepatitis C Screening  Completed  . PNA vac Low Risk Adult  Completed       Plan:    Continue doing brain stimulating activities (puzzles, reading, adult coloring books, staying active) to keep memory sharp.   Continue to eat heart healthy diet (full of fruits, vegetables, whole grains, lean protein, water--limit salt, fat, and sugar intake) and increase physical activity as tolerated.  I have personally reviewed and noted the following in the patient's chart:   . Medical and social history . Use of alcohol, tobacco or illicit drugs  . Current medications and supplements . Functional ability and status . Nutritional status . Physical activity . Advanced directives . List of other physicians . Vitals . Screenings to include cognitive, depression, and falls . Referrals and appointments  In addition, I have reviewed and discussed with patient certain preventive protocols, quality metrics, and best practice recommendations. A written personalized care plan for preventive services as well as general preventive health recommendations were provided to patient.     Michiel Cowboy, RN  04/20/2017   Medical screening examination/treatment/procedure(s) were performed by non-physician practitioner and as supervising physician I was immediately available for consultation/collaboration. I agree with  above. Binnie Rail, MD

## 2017-04-20 NOTE — Assessment & Plan Note (Signed)
Blood pressure elevated here today, but has been well controlled at home Stressed monitoring it on a regular basis Encouraged regular exercise Continue current medications Check CMP

## 2017-04-20 NOTE — Assessment & Plan Note (Signed)
Check lipid panel  Continue daily statin Regular exercise and healthy diet encouraged  

## 2017-04-20 NOTE — Assessment & Plan Note (Signed)
Check tsh  Titrate med dose if needed  

## 2017-04-20 NOTE — Assessment & Plan Note (Addendum)
1+ bilateral lower extremity edema Taking 25 mg Spironolactone CMP

## 2017-04-20 NOTE — Assessment & Plan Note (Signed)
GERD controlled Continue daily medication  

## 2017-04-20 NOTE — Assessment & Plan Note (Signed)
Likely pinched nerve from either thoracic spine or muscle tightness/spasm Gabapentin and Flexeril seem to be helping-continue If no improvement can refer to sports medicine or for physical therapy, which she deferred today

## 2017-04-20 NOTE — Progress Notes (Signed)
Subjective:    Patient ID: Tina Molina, female    DOB: [birth year], 71 y.o.   MRN: [id number]  HPI The patient is here for follow up.  Hypertension: She is taking her medication daily. She is compliant with a low sodium diet.  She denies chest pain, and regular headaches. She is not exercising regularly.  She does monitor her blood pressure at home sometimes - 130/70's.    Hypothyroidism:  She is taking her medication daily.  She denies any recent changes in energy or weight that are unexplained.   Hyperlipidemia: She is taking her medication daily. She is compliant with a low fat/cholesterol diet. She is not exercising regularly. She denies myalgias.   GERD:  She is taking her medication daily as prescribed.  She denies any GERD symptoms and feels her GERD is well controlled.   Left posterior shoulder pain/upper back pain:  It started 6 weeks ago she started having stinging/burning type pain/pin sensation with sharp pain intermittently.  Her symptoms are intermittent.  It may start when she is laying in the bed or when she puts her head down.  She is taking gabapentin and flexeril.  They seem to help.    Medications and allergies reviewed with patient and updated if appropriate.  Patient Active Problem List   Diagnosis Date Noted  . Upper back pain on left side 04/20/2017  . IDA (iron deficiency anemia) 04/06/2016  . Erythropoietin deficiency anemia 02/23/2016  . Chronic insomnia 08/12/2015  . GERD (gastroesophageal reflux disease) 06/25/2015  . Hyperlipidemia 01/13/2015  . Hyperglycemia 01/13/2015  . Circadian rhythm sleep disorder 08/14/2014  . Rheumatoid factor positive 02/20/2014  . Renal insufficiency 11/03/2013  . Thoracic radiculopathy 10/30/2013  . Circadian rhythm sleep disorder, shift work type 11/03/2012  . Hematuria 08/05/2012  . MGUS (monoclonal gammopathy of unknown significance) 08/03/2012  . Lumbar stenosis with neurogenic claudication 02/29/2012  . Edema  09/13/2011  . Abnormal complete blood count 05/27/2011  . CKD (chronic kidney disease) stage 3, GFR 30-59 ml/min (HCC) 12/19/2009  . HERNIATED LUMBAR DISC 12/19/2009  . PULMONARY SARCOIDOSIS 10/16/2009  . Hypothyroidism 10/16/2009  . Anemia associated with stage 2 chronic renal failure 10/16/2009  . CARPAL TUNNEL SYNDROME, BILATERAL 10/16/2009  . UVEITIS 10/16/2009  . Essential hypertension 10/16/2009  . ARTHRITIS 10/16/2009  . CERVICAL DISC DISORDER 10/16/2009    Current Outpatient Medications on File Prior to Visit  Medication Sig Dispense Refill  . aspirin 81 MG tablet Take 81 mg by mouth daily.     Marland Kitchen atorvastatin (LIPITOR) 20 MG tablet take 1 tablet by mouth once daily 30 tablet 2  . carvedilol (COREG) 25 MG tablet take 1 tablet by mouth twice a day 180 tablet 1  . cyclobenzaprine (FLEXERIL) 10 MG tablet Take 10 mg by mouth 3 (three) times daily as needed for muscle spasms.    Marland Kitchen gabapentin (NEURONTIN) 300 MG capsule take 1 capsule by mouth every 8 hours if needed 90 capsule 1  . hydrALAZINE (APRESOLINE) 50 MG tablet take 1 tablet by mouth three times a day 270 tablet 1  . leflunomide (ARAVA) 20 MG tablet Take 20 mg by mouth daily.    Marland Kitchen levothyroxine (SYNTHROID, LEVOTHROID) 125 MCG tablet take 1 tablet by mouth once daily 90 tablet 0  . losartan (COZAAR) 100 MG tablet take 1 tablet by mouth once daily 90 tablet 1  . Multiple Vitamin (MULTIVITAMIN WITH MINERALS) TABS Take 1 tablet by mouth daily.    . ranitidine (  ZANTAC) 150 MG tablet take 1 tablet by mouth twice a day 180 tablet 1  . spironolactone (ALDACTONE) 50 MG tablet take 1/2 tablet by mouth once daily 45 tablet 1  . SYMBICORT 160-4.5 MCG/ACT inhaler inhale 2 puffs if needed for wheezing 10.2 g 0  . vitamin E 400 UNIT capsule Take 400 Units by mouth daily.     Marland Kitchen zolpidem (AMBIEN) 10 MG tablet take 1 tablet by mouth at bedtime if needed for sleep 90 tablet 1  . [DISCONTINUED] modafinil (PROVIGIL) 100 MG tablet Take 100 mg by  mouth daily.    . [DISCONTINUED] potassium chloride (MICRO-K) 10 MEQ CR capsule Take 1 capsule (10 mEq total) by mouth 2 (two) times daily. 180 capsule 1   No current facility-administered medications on file prior to visit.     Past Medical History:  Diagnosis Date  . Anemia    Associated with leukopenia and lymphocytosis. This is been evaluated by Dr. Marin Olp . Her platelet count has been normal  . Arthritis   . Chronic back pain   . Chronic kidney disease   . Erythropoietin deficiency anemia 02/23/2016  . GERD (gastroesophageal reflux disease)   . Hypertension   . Lymphocytosis   . MGUS (monoclonal gammopathy of unknown significance) 08/03/2012   IgA 792 11/04/11  . Pneumonia 04/2010   OP  . Renal insufficiency   . Sarcoidosis   . Thyroid disease     Past Surgical History:  Procedure Laterality Date  . CARPAL TUNNEL RELEASE     Bilateral   . CATARACT EXTRACTION Right 05/2015  . CERVICAL DISCECTOMY  06/2010   Dr.Pool  . CHOLECYSTECTOMY    . COLONOSCOPY  2010  . POSTERIOR LUMBAR FUSION  02/29/2012   Dr Alyson Locket op respiratory & renal compromise  . THYROIDECTOMY  2003   For goiter    Social History   Socioeconomic History  . Marital status: Divorced    Spouse name: None  . Number of children: 0  . Years of education: 26  . Highest education level: None  Social Needs  . Financial resource strain: None  . Food insecurity - worry: None  . Food insecurity - inability: None  . Transportation needs - medical: None  . Transportation needs - non-medical: None  Occupational History  . Occupation: Retired    Fish farm manager: RETIRED  Tobacco Use  . Smoking status: Former Smoker    Packs/day: 0.50    Years: 20.00    Pack years: 10.00    Types: Cigarettes    Last attempt to quit: 05/17/1997    Years since quitting: 19.9  . Smokeless tobacco: Never Used  Substance and Sexual Activity  . Alcohol use: Yes    Alcohol/week: 0.6 oz    Types: 1 Glasses of wine per week     Comment: occasionally   . Drug use: No  . Sexual activity: None  Other Topics Concern  . None  Social History Narrative   Patient is divorced and lives with her sister. Patient is retired.   Caffeine- sometimes - one cup   Right handed.    Family History  Problem Relation Age of Onset  . Anemia Father   . Arthritis Father   . Heart failure Father 66       No CAD  . Diabetes Mother   . Arthritis Mother   . Glaucoma Mother   . Heart disease Mother 73       No CAD  . Hypertension Brother   .  Anemia Brother   . Kidney disease Brother   . Hypertension Sister   . Kidney disease Sister   . Anemia Sister   . Diabetes Maternal Aunt   . Arthritis Paternal Grandmother     Review of Systems  Constitutional: Negative for chills and fever.  Respiratory: Positive for shortness of breath (with exertion). Negative for cough and wheezing.   Cardiovascular: Positive for palpitations (occ) and leg swelling (ankles and hands). Negative for chest pain.  Neurological: Positive for headaches (transient). Negative for light-headedness.       Objective:   Vitals:   04/20/17 1345  BP: (!) 168/76  Pulse: (!) 101  Resp: 16  Temp: 98.2 F (36.8 C)  SpO2: 96%   Wt Readings from Last 3 Encounters:  04/20/17 206 lb (93.4 kg)  03/01/17 202 lb (91.6 kg)  12/17/16 202 lb (91.6 kg)   Body mass index is 35.36 kg/m.   Physical Exam    Constitutional: Appears well-developed and well-nourished. No distress.  HENT:  Head: Normocephalic and atraumatic.  Neck: Neck supple. No tracheal deviation present. No thyromegaly present.  No cervical lymphadenopathy Cardiovascular: Normal rate, regular rhythm and normal heart sounds.   2/6 systolic murmur heard. No carotid bruit .  1+ bilateral lower extremity edema Pulmonary/Chest: Effort normal and breath sounds normal. No respiratory distress. No has no wheezes. No rales.  Skin: Skin is warm and dry. Not diaphoretic.  Psychiatric: Normal mood and  affect. Behavior is normal.      Assessment & Plan:    See Problem List for Assessment and Plan of chronic medical problems.

## 2017-04-20 NOTE — Patient Instructions (Addendum)
Have blood work done.   Follow up in 6 months.   Continue doing brain stimulating activities (puzzles, reading, adult coloring books, staying active) to keep memory sharp.   Continue to eat heart healthy diet (full of fruits, vegetables, whole grains, lean protein, water--limit salt, fat, and sugar intake) and increase physical activity as tolerated.   Tina Molina , Thank you for taking time to come for your Medicare Wellness Visit. I appreciate your ongoing commitment to your health goals. Please review the following plan we discussed and let me know if I can assist you in the future.   These are the goals we discussed: Goals    . Patient Stated     Increase my physical activity. I can start to do the elliptcal 3 times weekly for 5 minutes.       This is a list of the screening recommended for you and due dates:  Health Maintenance  Topic Date Due  . DEXA scan (bone density measurement)  05/29/2013  . Colon Cancer Screening  10/03/2017  . Mammogram  04/30/2018  . Tetanus Vaccine  06/02/2019  . Flu Shot  Completed  .  Hepatitis C: One time screening is recommended by Center for Disease Control  (CDC) for  adults born from [birth year] through 1965.   Completed  . Pneumonia vaccines  Completed

## 2017-04-21 DIAGNOSIS — H40013 Open angle with borderline findings, low risk, bilateral: Secondary | ICD-10-CM | POA: Diagnosis not present

## 2017-04-21 NOTE — Progress Notes (Addendum)
Subjective:   Tina Molina is a 71 y.o. female who presents for Medicare Annual (Subsequent) preventive examination.  Review of Systems:  No ROS.  Medicare Wellness Visit. Additional risk factors are reflected in the social history.  Cardiac Risk Factors include: advanced age (>37men, >25 women);dyslipidemia;hypertension;obesity (BMI >30kg/m2);sedentary lifestyle Sleep patterns: has interrupted sleep, gets up 2-3 times nightly to void and sleeps 5-6 hours nightly.  Patient reports insomnia issues, discussed recommended sleep tips.   Home Safety/Smoke Alarms: Feels safe in home. Smoke alarms in place.  Living environment; residence and Firearm Safety: 1-story house/ trailer, no firearms. Lives with sister, no needs for DME, good support system Seat Belt Safety/Bike Helmet: Wears seat belt.     Objective:     Vitals: BP (!) 168/76   Pulse (!) 101   Temp 98.2 F (36.8 C) (Oral)   Resp 16   Wt 206 lb (93.4 kg)   SpO2 96%   BMI 35.36 kg/m   Body mass index is 35.36 kg/m.  Advanced Directives 04/20/2017 12/17/2016 10/06/2016 07/07/2016 04/20/2016 04/05/2016 03/22/2016  Does Patient Have a Medical Advance Directive? Yes No No No No No No  Does patient want to make changes to medical advance directive? Yes (ED - Information included in AVS) - - - - - -  Would patient like information on creating a medical advance directive? - - - No - Patient declined No - Patient declined - No - patient declined information  Pre-existing out of facility DNR order (yellow form or pink MOST form) - - - - - - -    Tobacco Social History   Tobacco Use  Smoking Status Former Smoker  . Packs/day: 0.50  . Years: 20.00  . Pack years: 10.00  . Types: Cigarettes  . Last attempt to quit: 05/17/1997  . Years since quitting: 19.9  Smokeless Tobacco Never Used     Counseling given: Not Answered  Past Medical History:  Diagnosis Date  . Anemia    Associated with leukopenia and lymphocytosis. This is been  evaluated by Dr. Marin Olp . Her platelet count has been normal  . Arthritis   . Chronic back pain   . Chronic kidney disease   . Erythropoietin deficiency anemia 02/23/2016  . GERD (gastroesophageal reflux disease)   . Hypertension   . Lymphocytosis   . MGUS (monoclonal gammopathy of unknown significance) 08/03/2012   IgA 792 11/04/11  . Pneumonia 04/2010   OP  . Renal insufficiency   . Sarcoidosis   . Thyroid disease    Past Surgical History:  Procedure Laterality Date  . CARPAL TUNNEL RELEASE     Bilateral   . CATARACT EXTRACTION Right 05/2015  . CERVICAL DISCECTOMY  06/2010   Dr.Pool  . CHOLECYSTECTOMY    . COLONOSCOPY  2010  . POSTERIOR LUMBAR FUSION  02/29/2012   Dr Alyson Locket op respiratory & renal compromise  . THYROIDECTOMY  2003   For goiter   Family History  Problem Relation Age of Onset  . Anemia Father   . Arthritis Father   . Heart failure Father 64       No CAD  . Diabetes Mother   . Arthritis Mother   . Glaucoma Mother   . Heart disease Mother 59       No CAD  . Hypertension Brother   . Anemia Brother   . Kidney disease Brother   . Hypertension Sister   . Kidney disease Sister   . Anemia Sister   .  Diabetes Maternal Aunt   . Arthritis Paternal Grandmother    Social History   Socioeconomic History  . Marital status: Divorced    Spouse name: None  . Number of children: 0  . Years of education: 45  . Highest education level: None  Social Needs  . Financial resource strain: None  . Food insecurity - worry: None  . Food insecurity - inability: None  . Transportation needs - medical: None  . Transportation needs - non-medical: None  Occupational History  . Occupation: Retired    Fish farm manager: RETIRED  Tobacco Use  . Smoking status: Former Smoker    Packs/day: 0.50    Years: 20.00    Pack years: 10.00    Types: Cigarettes    Last attempt to quit: 05/17/1997    Years since quitting: 19.9  . Smokeless tobacco: Never Used  Substance and Sexual  Activity  . Alcohol use: Yes    Alcohol/week: 0.6 oz    Types: 1 Glasses of wine per week    Comment: occasionally   . Drug use: No  . Sexual activity: None  Other Topics Concern  . None  Social History Narrative   Patient is divorced and lives with her sister. Patient is retired.   Caffeine- sometimes - one cup   Right handed.    Outpatient Encounter Medications as of 04/20/2017  Medication Sig  . aspirin 81 MG tablet Take 81 mg by mouth daily.   Marland Kitchen atorvastatin (LIPITOR) 20 MG tablet take 1 tablet by mouth once daily  . carvedilol (COREG) 25 MG tablet take 1 tablet by mouth twice a day  . cyclobenzaprine (FLEXERIL) 10 MG tablet Take 10 mg by mouth 3 (three) times daily as needed for muscle spasms.  Marland Kitchen gabapentin (NEURONTIN) 300 MG capsule take 1 capsule by mouth every 8 hours if needed  . hydrALAZINE (APRESOLINE) 50 MG tablet take 1 tablet by mouth three times a day  . leflunomide (ARAVA) 20 MG tablet Take 20 mg by mouth daily.  Marland Kitchen levothyroxine (SYNTHROID, LEVOTHROID) 125 MCG tablet take 1 tablet by mouth once daily  . losartan (COZAAR) 100 MG tablet take 1 tablet by mouth once daily  . Multiple Vitamin (MULTIVITAMIN WITH MINERALS) TABS Take 1 tablet by mouth daily.  . ranitidine (ZANTAC) 150 MG tablet take 1 tablet by mouth twice a day  . spironolactone (ALDACTONE) 50 MG tablet take 1/2 tablet by mouth once daily  . SYMBICORT 160-4.5 MCG/ACT inhaler inhale 2 puffs if needed for wheezing  . vitamin E 400 UNIT capsule Take 400 Units by mouth daily.   Marland Kitchen zolpidem (AMBIEN) 10 MG tablet take 1 tablet by mouth at bedtime if needed for sleep   No facility-administered encounter medications on file as of 04/20/2017.     Activities of Daily Living In your present state of health, do you have any difficulty performing the following activities: 04/20/2017  Hearing? N  Vision? N  Difficulty concentrating or making decisions? N  Walking or climbing stairs? N  Dressing or bathing? N  Doing  errands, shopping? N  Preparing Food and eating ? N  Using the Toilet? N  In the past six months, have you accidently leaked urine? N  Do you have problems with loss of bowel control? N  Managing your Medications? N  Managing your Finances? N  Housekeeping or managing your Housekeeping? N  Some recent data might be hidden    Patient Care Team: Binnie Rail, MD as PCP -  General (Internal Medicine)    Assessment:    Physical assessment deferred to PCP.  Exercise Activities and Dietary recommendations Current Exercise Habits: The patient does not participate in regular exercise at present, Exercise limited by: None identified  Diet (meal preparation, eat out, water intake, caffeinated beverages, dairy products, fruits and vegetables): in general, a "healthy" diet     Reviewed heart healthy diet, encouraged patient to increase daily water intake. Diet education was provided via handout.  Goals    . Patient Stated     Increase my physical activity. I can start to do the elliptical 3 times weekly for 5 minutes.      Fall Risk Fall Risk  04/20/2017 04/20/2017 07/07/2016 04/20/2016 04/05/2016  Falls in the past year? No Yes No No No  Number falls in past yr: 1 1 - - -  Injury with Fall? No No - - -  Risk for fall due to : - - - - -  Risk for fall due to: Comment - - - - -  Follow up - - - - -    Depression Screen PHQ 2/9 Scores 04/20/2017 04/20/2017 12/31/2015 08/20/2013  PHQ - 2 Score 2 0 0 0  PHQ- 9 Score 4 - - -     Cognitive Function MMSE - Mini Mental State Exam 04/20/2017 12/31/2015  Not completed: - (No Data)  Orientation to time 5 -  Orientation to Place 5 -  Registration 3 -  Attention/ Calculation 5 -  Recall 1 -  Language- name 2 objects 2 -  Language- repeat 1 -  Language- follow 3 step command 3 -  Language- read & follow direction 1 -  Write a sentence 1 -  Copy design 1 -  Total score 28 -        Immunization History  Administered Date(s) Administered    . Influenza Whole 01/20/2010  . Influenza, High Dose Seasonal PF 03/01/2013, 03/13/2015, 04/20/2017  . Influenza,inj,Quad PF,6+ Mos 04/05/2016  . Pneumococcal Conjugate-13 01/13/2015  . Pneumococcal Polysaccharide-23 05/30/2013  . Td 06/01/2009   Screening Tests Health Maintenance  Topic Date Due  . DEXA SCAN  04/20/2018 (Originally 05/29/2013)  . COLONOSCOPY  10/03/2017  . MAMMOGRAM  04/30/2018  . TETANUS/TDAP  06/02/2019  . INFLUENZA VACCINE  Completed  . Hepatitis C Screening  Completed  . PNA vac Low Risk Adult  Completed       Plan:    Continue doing brain stimulating activities (puzzles, reading, adult coloring books, staying active) to keep memory sharp.   Continue to eat heart healthy diet (full of fruits, vegetables, whole grains, lean protein, water--limit salt, fat, and sugar intake) and increase physical activity as tolerated.  I have personally reviewed and noted the following in the patient's chart:   . Medical and social history . Use of alcohol, tobacco or illicit drugs  . Current medications and supplements . Functional ability and status . Nutritional status . Physical activity . Advanced directives . List of other physicians . Vitals . Screenings to include cognitive, depression, and falls . Referrals and appointments  In addition, I have reviewed and discussed with patient certain preventive protocols, quality metrics, and best practice recommendations. A written personalized care plan for preventive services as well as general preventive health recommendations were provided to patient.     Michiel Cowboy, RN  04/21/2017   Medical screening examination/treatment/procedure(s) were performed by non-physician practitioner and as supervising physician I was immediately available for consultation/collaboration. I  agree with above. Michiel Cowboy, RN   Medical screening examination/treatment/procedure(s) were performed by non-physician practitioner and as  supervising physician I was immediately available for consultation/collaboration. I agree with above. Binnie Rail, MD

## 2017-04-22 ENCOUNTER — Other Ambulatory Visit: Payer: Self-pay | Admitting: Emergency Medicine

## 2017-04-22 DIAGNOSIS — E890 Postprocedural hypothyroidism: Secondary | ICD-10-CM

## 2017-04-22 DIAGNOSIS — H40013 Open angle with borderline findings, low risk, bilateral: Secondary | ICD-10-CM | POA: Diagnosis not present

## 2017-04-22 MED ORDER — LEVOTHYROXINE SODIUM 137 MCG PO CAPS: 137.0000 ug | ORAL_CAPSULE | Freq: Every day | ORAL | 1 refills | Status: DC

## 2017-04-23 ENCOUNTER — Other Ambulatory Visit: Payer: Self-pay | Admitting: Internal Medicine

## 2017-04-28 ENCOUNTER — Other Ambulatory Visit: Payer: Self-pay | Admitting: Emergency Medicine

## 2017-04-28 ENCOUNTER — Telehealth: Payer: Self-pay | Admitting: Internal Medicine

## 2017-04-28 MED ORDER — LEVOTHYROXINE SODIUM 137 MCG PO TABS: 137.0000 ug | ORAL_TABLET | Freq: Every day | ORAL | 1 refills | Status: DC

## 2017-04-28 NOTE — Telephone Encounter (Signed)
Copied from [place]. Topic: Quick Communication - See Telephone Encounter >> Apr 28, 2017 12:06 PM Corie Chiquito, [place] wrote: CRM for notification. See Telephone encounter for: Patient called because her insurance will not cover the capsule form of her Levothyroxine and would like to know if she could get them in a pill form instead. If someone could please give her a call back about this at [phone]  04/28/17.

## 2017-04-29 NOTE — Telephone Encounter (Signed)
Pt. Is requesting a tablet form of her thyroid medication. Thanks.

## 2017-04-29 NOTE — Telephone Encounter (Signed)
This was taken care of as of 4:47 pm on 04/28/17

## 2017-05-08 ENCOUNTER — Other Ambulatory Visit: Payer: Self-pay | Admitting: Internal Medicine

## 2017-05-22 ENCOUNTER — Other Ambulatory Visit: Payer: Self-pay | Admitting: Neurology

## 2017-05-22 DIAGNOSIS — F5104 Psychophysiologic insomnia: Secondary | ICD-10-CM

## 2017-06-09 ENCOUNTER — Telehealth: Payer: Self-pay | Admitting: Internal Medicine

## 2017-06-09 MED ORDER — TELMISARTAN 80 MG PO TABS: 80.0000 mg | ORAL_TABLET | Freq: Every day | ORAL | 5 refills | Status: DC

## 2017-06-09 NOTE — Telephone Encounter (Signed)
Pt request medication change

## 2017-06-09 NOTE — Telephone Encounter (Signed)
A similar medication to losartan was sent to her pharmacy.  If it is not covered she should let us know.    She should monitor her BP or come in for a nurse visit for a BP check with change

## 2017-06-09 NOTE — Telephone Encounter (Signed)
Called pt no answer LMO w/MD response...Johny Chess

## 2017-06-09 NOTE — Telephone Encounter (Signed)
Copied from [place]. Topic: Quick Communication - See Telephone Encounter >> Jun 09, 2017  9:49 AM Bea Graff, [place] wrote: CRM for notification. See Telephone encounter for: Pt received a letter in the mail from [facility] [address] that states her  losartan (COZAAR) has been recalled. Pt would like to see if a different medication can be called into the pharmacy for her.   06/09/17.

## 2017-06-15 ENCOUNTER — Other Ambulatory Visit: Payer: Self-pay | Admitting: Internal Medicine

## 2017-06-21 DIAGNOSIS — M5136 Other intervertebral disc degeneration, lumbar region: Secondary | ICD-10-CM | POA: Diagnosis not present

## 2017-06-21 DIAGNOSIS — Z6834 Body mass index (BMI) 34.0-34.9, adult: Secondary | ICD-10-CM | POA: Diagnosis not present

## 2017-06-21 DIAGNOSIS — N183 Chronic kidney disease, stage 3 (moderate): Secondary | ICD-10-CM | POA: Diagnosis not present

## 2017-06-21 DIAGNOSIS — M25511 Pain in right shoulder: Secondary | ICD-10-CM | POA: Diagnosis not present

## 2017-06-21 DIAGNOSIS — E669 Obesity, unspecified: Secondary | ICD-10-CM | POA: Diagnosis not present

## 2017-06-21 DIAGNOSIS — D860 Sarcoidosis of lung: Secondary | ICD-10-CM | POA: Diagnosis not present

## 2017-06-21 DIAGNOSIS — M0589 Other rheumatoid arthritis with rheumatoid factor of multiple sites: Secondary | ICD-10-CM | POA: Diagnosis not present

## 2017-06-21 DIAGNOSIS — M150 Primary generalized (osteo)arthritis: Secondary | ICD-10-CM | POA: Diagnosis not present

## 2017-06-21 DIAGNOSIS — M898X1 Other specified disorders of bone, shoulder: Secondary | ICD-10-CM | POA: Diagnosis not present

## 2017-06-22 ENCOUNTER — Other Ambulatory Visit: Payer: Self-pay | Admitting: Internal Medicine

## 2017-06-22 DIAGNOSIS — I10 Essential (primary) hypertension: Secondary | ICD-10-CM

## 2017-06-23 ENCOUNTER — Other Ambulatory Visit: Payer: Self-pay | Admitting: Internal Medicine

## 2017-06-28 ENCOUNTER — Encounter: Payer: Self-pay | Admitting: Family

## 2017-06-28 ENCOUNTER — Other Ambulatory Visit: Payer: Self-pay

## 2017-06-28 ENCOUNTER — Inpatient Hospital Stay: Payer: Medicare Other

## 2017-06-28 ENCOUNTER — Inpatient Hospital Stay: Payer: Medicare Other | Attending: Family | Admitting: Family

## 2017-06-28 ENCOUNTER — Telehealth: Payer: Self-pay | Admitting: Internal Medicine

## 2017-06-28 VITALS — BP 135/62 | HR 96 | Temp 98.5°F | Resp 18 | Wt 204.8 lb

## 2017-06-28 DIAGNOSIS — N182 Chronic kidney disease, stage 2 (mild): Secondary | ICD-10-CM

## 2017-06-28 DIAGNOSIS — Z882 Allergy status to sulfonamides status: Secondary | ICD-10-CM | POA: Diagnosis not present

## 2017-06-28 DIAGNOSIS — Z7982 Long term (current) use of aspirin: Secondary | ICD-10-CM | POA: Diagnosis not present

## 2017-06-28 DIAGNOSIS — D509 Iron deficiency anemia, unspecified: Secondary | ICD-10-CM | POA: Insufficient documentation

## 2017-06-28 DIAGNOSIS — N183 Chronic kidney disease, stage 3 unspecified: Secondary | ICD-10-CM

## 2017-06-28 DIAGNOSIS — D631 Anemia in chronic kidney disease: Secondary | ICD-10-CM

## 2017-06-28 DIAGNOSIS — R0609 Other forms of dyspnea: Secondary | ICD-10-CM | POA: Diagnosis not present

## 2017-06-28 DIAGNOSIS — D508 Other iron deficiency anemias: Secondary | ICD-10-CM

## 2017-06-28 DIAGNOSIS — Z881 Allergy status to other antibiotic agents status: Secondary | ICD-10-CM | POA: Diagnosis not present

## 2017-06-28 DIAGNOSIS — Z79899 Other long term (current) drug therapy: Secondary | ICD-10-CM | POA: Diagnosis not present

## 2017-06-28 DIAGNOSIS — R5383 Other fatigue: Secondary | ICD-10-CM | POA: Insufficient documentation

## 2017-06-28 DIAGNOSIS — Z880 Allergy status to penicillin: Secondary | ICD-10-CM | POA: Diagnosis not present

## 2017-06-28 DIAGNOSIS — Z885 Allergy status to narcotic agent status: Secondary | ICD-10-CM | POA: Insufficient documentation

## 2017-06-28 DIAGNOSIS — D472 Monoclonal gammopathy: Secondary | ICD-10-CM | POA: Insufficient documentation

## 2017-06-28 LAB — CBC WITH DIFFERENTIAL (CANCER CENTER ONLY)
Basophils Absolute: 0 10*3/uL (ref 0.0–0.1)
Basophils Relative: 1 %
Eosinophils Absolute: 0.1 10*3/uL (ref 0.0–0.5)
Eosinophils Relative: 3 %
HCT: 28.9 % — ABNORMAL LOW (ref 34.8–46.6)
Hemoglobin: 9.4 g/dL — ABNORMAL LOW (ref 11.6–15.9)
Lymphocytes Relative: 43 %
Lymphs Abs: 1.6 10*3/uL (ref 0.9–3.3)
MCH: 32.8 pg (ref 26.0–34.0)
MCHC: 32.5 g/dL (ref 32.0–36.0)
MCV: 100.7 fL (ref 81.0–101.0)
Monocytes Absolute: 0.5 10*3/uL (ref 0.1–0.9)
Monocytes Relative: 12 %
Neutro Abs: 1.6 10*3/uL (ref 1.5–6.5)
Neutrophils Relative %: 41 %
Platelet Count: 193 10*3/uL (ref 145–400)
RBC: 2.87 MIL/uL — ABNORMAL LOW (ref 3.70–5.32)
RDW: 12.9 % (ref 11.1–15.7)
WBC Count: 3.8 10*3/uL — ABNORMAL LOW (ref 3.9–10.0)

## 2017-06-28 LAB — CMP (CANCER CENTER ONLY)
ALT: 16 U/L (ref 10–47)
AST: 23 U/L (ref 11–38)
Albumin: 3.4 g/dL — ABNORMAL LOW (ref 3.5–5.0)
Alkaline Phosphatase: 63 U/L (ref 26–84)
Anion gap: 10 (ref 5–15)
BUN: 25 mg/dL — ABNORMAL HIGH (ref 7–22)
CO2: 26 mmol/L (ref 18–33)
Calcium: 9.3 mg/dL (ref 8.0–10.3)
Chloride: 109 mmol/L — ABNORMAL HIGH (ref 98–108)
Creatinine: 1.7 mg/dL — ABNORMAL HIGH (ref 0.60–1.20)
Glucose, Bld: 130 mg/dL — ABNORMAL HIGH (ref 73–118)
Potassium: 3.5 mmol/L (ref 3.3–4.7)
Sodium: 145 mmol/L (ref 128–145)
Total Bilirubin: 0.7 mg/dL (ref 0.2–1.6)
Total Protein: 7.4 g/dL (ref 6.4–8.1)

## 2017-06-28 LAB — RETICULOCYTES
RBC.: 2.93 MIL/uL — ABNORMAL LOW (ref 3.70–5.45)
Retic Count, Absolute: 61.5 10*3/uL (ref 33.7–90.7)
Retic Ct Pct: 2.1 % (ref 0.7–2.1)

## 2017-06-28 MED ORDER — DARBEPOETIN ALFA 300 MCG/0.6ML IJ SOSY
PREFILLED_SYRINGE | INTRAMUSCULAR | Status: AC
  Filled 2017-06-28: qty 0.6

## 2017-06-28 MED ORDER — DARBEPOETIN ALFA 300 MCG/0.6ML IJ SOSY
300.0000 ug | PREFILLED_SYRINGE | Freq: Once | INTRAMUSCULAR | Status: AC
  Administered 2017-06-28: 300 ug via SUBCUTANEOUS

## 2017-06-28 NOTE — Telephone Encounter (Signed)
Ok to come in for a nurse visit anytime.  Has f/u with me in June

## 2017-06-28 NOTE — Patient Instructions (Signed)
Darbepoetin Alfa injection (Aranesp) What is this medicine? DARBEPOETIN ALFA (dar be POE e tin AL fa) helps your body make more red blood cells. It is used to treat anemia caused by chronic kidney failure and chemotherapy. This medicine may be used for other purposes; ask your health care provider or pharmacist if you have questions. COMMON BRAND NAME(S): Aranesp What should I tell my health care provider before I take this medicine? They need to know if you have any of these conditions: -blood clotting disorders or history of blood clots -cancer patient not on chemotherapy -cystic fibrosis -heart disease, such as angina, heart failure, or a history of a heart attack -hemoglobin level of 12 g/dL or greater -high blood pressure -low levels of folate, iron, or vitamin B12 -seizures -an unusual or allergic reaction to darbepoetin, erythropoietin, albumin, hamster proteins, latex, other medicines, foods, dyes, or preservatives -pregnant or trying to get pregnant -breast-feeding How should I use this medicine? This medicine is for injection into a vein or under the skin. It is usually given by a health care professional in a hospital or clinic setting. If you get this medicine at home, you will be taught how to prepare and give this medicine. Use exactly as directed. Take your medicine at regular intervals. Do not take your medicine more often than directed. It is important that you put your used needles and syringes in a special sharps container. Do not put them in a trash can. If you do not have a sharps container, call your pharmacist or healthcare provider to get one. A special MedGuide will be given to you by the pharmacist with each prescription and refill. Be sure to read this information carefully each time. Talk to your pediatrician regarding the use of this medicine in children. While this medicine may be used in children as young as 1 year for selected conditions, precautions do  apply. Overdosage: If you think you have taken too much of this medicine contact a poison control center or emergency room at once. NOTE: This medicine is only for you. Do not share this medicine with others. What if I miss a dose? If you miss a dose, take it as soon as you can. If it is almost time for your next dose, take only that dose. Do not take double or extra doses. What may interact with this medicine? Do not take this medicine with any of the following medications: -epoetin alfa This list may not describe all possible interactions. Give your health care provider a list of all the medicines, herbs, non-prescription drugs, or dietary supplements you use. Also tell them if you smoke, drink alcohol, or use illegal drugs. Some items may interact with your medicine. What should I watch for while using this medicine? Your condition will be monitored carefully while you are receiving this medicine. You may need blood work done while you are taking this medicine. What side effects may I notice from receiving this medicine? Side effects that you should report to your doctor or health care professional as soon as possible: -allergic reactions like skin rash, itching or hives, swelling of the face, lips, or tongue -breathing problems -changes in vision -chest pain -confusion, trouble speaking or understanding -feeling faint or lightheaded, falls -high blood pressure -muscle aches or pains -pain, swelling, warmth in the leg -rapid weight gain -severe headaches -sudden numbness or weakness of the face, arm or leg -trouble walking, dizziness, loss of balance or coordination -seizures (convulsions) -swelling of the ankles, feet,   hands -unusually weak or tired Side effects that usually do not require medical attention (report to your doctor or health care professional if they continue or are bothersome): -diarrhea -fever, chills (flu-like symptoms) -headaches -nausea, vomiting -redness,  stinging, or swelling at site where injected This list may not describe all possible side effects. Call your doctor for medical advice about side effects. You may report side effects to FDA at 1-800-FDA-1088. Where should I keep my medicine? Keep out of the reach of children. Store in a refrigerator between 2 and 8 degrees C (36 and 46 degrees F). Do not freeze. Do not shake. Throw away any unused portion if using a single-dose vial. Throw away any unused medicine after the expiration date. NOTE: This sheet is a summary. It may not cover all possible information. If you have questions about this medicine, talk to your doctor, pharmacist, or health care provider.  2018 Elsevier/Gold Standard (2015-12-22 19:52:26)  

## 2017-06-28 NOTE — Progress Notes (Signed)
Hematology and Oncology Follow Up Visit  Tina Molina [id number] 03-14-1946 71 y.o. 06/28/2017   Principle Diagnosis:  Anemia of chronic renal failure - stage II MGUS - IgA lambda Iron deficiency anemia   Current Therapy:   Aranesp 300 mcg SQ to keep Hgb > 11 IV iron as indicated - last received in December 2017    Interim History:  Tina Molina is here today for follow-up. She is symptomatic with fatigue, SOB with exertion (extended periods of walking) and craving cold drinks.  No episodes of bleeding, no bruising or petechiae.  No fever, chills, n/v, cough, rash, dizziness, SOB, chest pain, palpitations, abdominal pain or changes in bowel or bladder habits.  No lymphadenopathy found on exam.  She has occasional puffiness in her hands and feet. The numbness in her toes due to back issues and previous surgery is unchanged.  She has maintained a good appetite and is staying well hydrated. Her weight is stable.   ECOG Performance Status: 1 - Symptomatic but completely ambulatory  Medications:  Allergies as of 06/28/2017      Reactions   Penicillins    Intolerance or allergy not remembered; her sister stated penicillin caused "boils under the skin"   Shellfish Allergy Rash   Shellfish-derived Products Anaphylaxis   Sulfonamide Derivatives    Arthralgias; her sister stated that this caused a rash.   Azithromycin    itching   Codeine    Nausea only with liquid codeine   Infliximab    Other reaction(s): Confusion (intolerance)      Medication List        Accurate as of 06/28/17  1:17 PM. Always use your most recent med list.          aspirin 81 MG tablet Take 81 mg by mouth daily.   atorvastatin 20 MG tablet Commonly known as:  LIPITOR take 1 tablet by mouth once daily   carvedilol 25 MG tablet Commonly known as:  COREG take 1 tablet by mouth twice a day   cyclobenzaprine 10 MG tablet Commonly known as:  FLEXERIL Take 10 mg by mouth 3 (three) times daily as  needed for muscle spasms.   gabapentin 300 MG capsule Commonly known as:  NEURONTIN take 1 capsule by mouth every 8 hours if needed   hydrALAZINE 50 MG tablet Commonly known as:  APRESOLINE take 1 tablet by mouth three times a day   leflunomide 20 MG tablet Commonly known as:  ARAVA Take 20 mg by mouth daily.   levothyroxine 137 MCG tablet Commonly known as:  SYNTHROID, LEVOTHROID Take 1 tablet (137 mcg total) by mouth daily before breakfast.   multivitamin with minerals Tabs tablet Take 1 tablet by mouth daily.   ranitidine 150 MG tablet Commonly known as:  ZANTAC take 1 tablet by mouth twice a day   spironolactone 50 MG tablet Commonly known as:  ALDACTONE take 1/2 tablet by mouth once daily   SYMBICORT 160-4.5 MCG/ACT inhaler Generic drug:  budesonide-formoterol inhale 2 puffs if needed for wheezing   telmisartan 80 MG tablet Commonly known as:  MICARDIS Take 1 tablet (80 mg total) by mouth daily.   vitamin E 400 UNIT capsule Take 400 Units by mouth daily.   zolpidem 10 MG tablet Commonly known as:  AMBIEN take 1 tablet by mouth at bedtime if needed for sleep       Allergies:  Allergies  Allergen Reactions  . Penicillins     Intolerance or allergy not remembered;  her sister stated penicillin caused "boils under the skin"  . Shellfish Allergy Rash  . Shellfish-Derived Products Anaphylaxis  . Sulfonamide Derivatives     Arthralgias; her sister stated that this caused a rash.  . Azithromycin     itching  . Codeine     Nausea only with liquid codeine  . Infliximab     Other reaction(s): Confusion (intolerance)    Past Medical History, Surgical history, Social history, and Family History were reviewed and updated.  Review of Systems: All other 10 point review of systems is negative.   Physical Exam:  vitals were not taken for this visit.   Wt Readings from Last 3 Encounters:  04/20/17 206 lb (93.4 kg)  03/01/17 202 lb (91.6 kg)  12/17/16 202 lb  (91.6 kg)    Ocular: Sclerae unicteric, pupils equal, round and reactive to light Ear-nose-throat: Oropharynx clear, dentition fair Lymphatic: No cervical, supraclavicular or axillary adenopathy Lungs no rales or rhonchi, good excursion bilaterally Heart regular rate and rhythm, no murmur appreciated Abd soft, nontender, positive bowel sounds, no liver or spleen tip palpated on exam, no fluid wave  MSK no focal spinal tenderness, no joint edema Neuro: non-focal, well-oriented, appropriate affect Breasts: Deferred   Lab Results  Component Value Date   WBC 4.0 03/01/2017   HGB 9.9 (L) 03/01/2017   HCT 29.6 (L) 03/01/2017   MCV 100 03/01/2017   PLT 182 03/01/2017   Lab Results  Component Value Date   FERRITIN 191 03/01/2017   IRON 82 03/01/2017   TIBC 280 03/01/2017   UIBC 199 03/01/2017   IRONPCTSAT 29 03/01/2017   Lab Results  Component Value Date   RETICCTPCT 1.5 01/03/2013   RBC 2.95 (L) 03/01/2017   RETICCTABS 51.6 01/03/2013   Lab Results  Component Value Date   KPAFRELGTCHN 1.97 (H) 01/03/2013   LAMBDASER 5.55 (H) 01/03/2013   KAPLAMBRATIO 0.49 12/17/2016   Lab Results  Component Value Date   IGGSERUM 893 12/17/2016   IGA 661 (H) 01/03/2013   IGMSERUM 30 12/17/2016   Lab Results  Component Value Date   TOTALPROTELP 7.2 01/03/2013   ALBUMINELP 57.9 11/04/2011   A1GS 3.1 11/04/2011   A2GS 8.9 11/04/2011   BETS 5.9 11/04/2011   BETA2SER 10.3 (H) 11/04/2011   GAMS 13.9 11/04/2011   MSPIKE 0.2 (H) 12/17/2016   SPEI * 11/04/2011     Chemistry      Component Value Date/Time   NA 143 04/20/2017 1458   NA 150 (H) 03/01/2017 1308   NA 143 04/05/2016 1156   K 4.4 04/20/2017 1458   K 4.0 03/01/2017 1308   K 3.8 04/05/2016 1156   CL 108 04/20/2017 1458   CL 109 (H) 03/01/2017 1308   CO2 28 04/20/2017 1458   CO2 28 03/01/2017 1308   CO2 21 (L) 04/05/2016 1156   BUN 19 04/20/2017 1458   BUN 19 03/01/2017 1308   BUN 18.5 04/05/2016 1156   CREATININE  1.47 (H) 04/20/2017 1458   CREATININE 1.8 (H) 03/01/2017 1308   CREATININE 1.5 (H) 04/05/2016 1156      Component Value Date/Time   CALCIUM 9.0 04/20/2017 1458   CALCIUM 9.7 03/01/2017 1308   CALCIUM 9.3 04/05/2016 1156   ALKPHOS 55 04/20/2017 1458   ALKPHOS 55 03/01/2017 1308   ALKPHOS 77 04/05/2016 1156   AST 19 04/20/2017 1458   AST 23 03/01/2017 1308   AST 21 04/05/2016 1156   ALT 17 04/20/2017 1458   ALT 18  03/01/2017 1308   ALT 22 04/05/2016 1156   BILITOT 0.4 04/20/2017 1458   BILITOT 0.50 03/01/2017 1308   BILITOT 0.36 04/05/2016 1156      Impression and Plan: Ms. Wenberg is a very pleasant 72 yo African American female with multifactorial anemia (iron deficiency and anemia of chronic renal insufficiency) as well as an IgA lambda MGUS.  We have rechecked her protein studies today and results are pending.  Hgb is 9.4 so she will receive Aranesp today. She is symptomatic with fatigue and SOB with over exertion.  We will see what her iron studies show and bring her back in for an infusion if needed.  We will plan to see her back in another 4 months (per her request) for follow-up.  She will contact our office with any questions or concerns. We can certainly see her sooner if need be.   Laverna Peace, NP 2/12/20191:17 PM

## 2017-06-28 NOTE — Progress Notes (Signed)
Patient complains of left shoulder pain for about a month. Patient states she told her rheumatoid arthritis physician and was given a cortisone injection. Patient states the injection helped alleviate the pain for a couple of days and then the pain returned. Tina Peace, NP notified.

## 2017-06-28 NOTE — Telephone Encounter (Signed)
Copied from [place]. Topic: Quick Communication - See Telephone Encounter >> Jun 28, 2017  3:32 PM Boyd Kerbs wrote: CRM for notification. See Telephone encounter for:   She has been checking her Blood Pressure and it has been running around 135/62.    She is asking if she still needs to come in.  IF she does she wanting to come in tomorrow 2/13 early afternoon if possible.   Please call pt.   06/28/17.

## 2017-06-28 NOTE — Telephone Encounter (Signed)
Would you like the patient to come in for a nurse visit or appointment with Dr. Quay Burow?

## 2017-06-29 ENCOUNTER — Other Ambulatory Visit (INDEPENDENT_AMBULATORY_CARE_PROVIDER_SITE_OTHER): Payer: Medicare Other

## 2017-06-29 ENCOUNTER — Encounter: Payer: Self-pay | Admitting: Internal Medicine

## 2017-06-29 DIAGNOSIS — I129 Hypertensive chronic kidney disease with stage 1 through stage 4 chronic kidney disease, or unspecified chronic kidney disease: Secondary | ICD-10-CM | POA: Diagnosis not present

## 2017-06-29 DIAGNOSIS — E890 Postprocedural hypothyroidism: Secondary | ICD-10-CM | POA: Diagnosis not present

## 2017-06-29 DIAGNOSIS — D860 Sarcoidosis of lung: Secondary | ICD-10-CM | POA: Diagnosis not present

## 2017-06-29 DIAGNOSIS — N183 Chronic kidney disease, stage 3 (moderate): Secondary | ICD-10-CM | POA: Diagnosis not present

## 2017-06-29 DIAGNOSIS — D472 Monoclonal gammopathy: Secondary | ICD-10-CM | POA: Diagnosis not present

## 2017-06-29 DIAGNOSIS — D631 Anemia in chronic kidney disease: Secondary | ICD-10-CM | POA: Diagnosis not present

## 2017-06-29 LAB — IRON AND TIBC
Iron: 103 ug/dL (ref 41–142)
Saturation Ratios: 37 % (ref 21–57)
TIBC: 279 ug/dL (ref 236–444)
UIBC: 176 ug/dL

## 2017-06-29 LAB — IGG, IGA, IGM
IgA: 520 mg/dL — ABNORMAL HIGH (ref 64–422)
IgG (Immunoglobin G), Serum: 979 mg/dL (ref 700–1600)
IgM (Immunoglobulin M), Srm: 25 mg/dL — ABNORMAL LOW (ref 26–217)

## 2017-06-29 LAB — KAPPA/LAMBDA LIGHT CHAINS
Kappa free light chain: 17.6 mg/L (ref 3.3–19.4)
Kappa, lambda light chain ratio: 0.61 (ref 0.26–1.65)
Lambda free light chains: 28.8 mg/L — ABNORMAL HIGH (ref 5.7–26.3)

## 2017-06-29 LAB — TSH: TSH: 1.16 u[IU]/mL (ref 0.35–4.50)

## 2017-06-29 LAB — FERRITIN: Ferritin: 197 ng/mL (ref 9–269)

## 2017-06-29 NOTE — Telephone Encounter (Signed)
Spoke with patient. She states she is doing fine right now. She will see Dr.Burns in June. If she needs to come in for a BP check she will make an appointment with the nurse.

## 2017-06-30 LAB — PROTEIN ELECTROPHORESIS, SERUM
A/G Ratio: 1.2 (ref 0.7–1.7)
Albumin ELP: 3.7 g/dL (ref 2.9–4.4)
Alpha-1-Globulin: 0.2 g/dL (ref 0.0–0.4)
Alpha-2-Globulin: 0.6 g/dL (ref 0.4–1.0)
Beta Globulin: 1.3 g/dL (ref 0.7–1.3)
Gamma Globulin: 0.9 g/dL (ref 0.4–1.8)
Globulin, Total: 3 g/dL (ref 2.2–3.9)
M-Spike, %: 0.3 g/dL — ABNORMAL HIGH
Total Protein ELP: 6.7 g/dL (ref 6.0–8.5)

## 2017-07-03 ENCOUNTER — Other Ambulatory Visit: Payer: Self-pay | Admitting: Internal Medicine

## 2017-07-18 ENCOUNTER — Other Ambulatory Visit: Payer: Self-pay | Admitting: Internal Medicine

## 2017-07-18 DIAGNOSIS — H40013 Open angle with borderline findings, low risk, bilateral: Secondary | ICD-10-CM | POA: Diagnosis not present

## 2017-07-18 DIAGNOSIS — H40053 Ocular hypertension, bilateral: Secondary | ICD-10-CM | POA: Diagnosis not present

## 2017-07-19 ENCOUNTER — Other Ambulatory Visit: Payer: Self-pay | Admitting: Emergency Medicine

## 2017-07-19 MED ORDER — ATORVASTATIN CALCIUM 20 MG PO TABS: 20.0000 mg | ORAL_TABLET | Freq: Every day | ORAL | 1 refills | Status: DC

## 2017-08-08 DIAGNOSIS — H40053 Ocular hypertension, bilateral: Secondary | ICD-10-CM | POA: Diagnosis not present

## 2017-08-08 DIAGNOSIS — H40013 Open angle with borderline findings, low risk, bilateral: Secondary | ICD-10-CM | POA: Diagnosis not present

## 2017-08-24 ENCOUNTER — Telehealth: Payer: Self-pay | Admitting: Neurology

## 2017-08-24 NOTE — Telephone Encounter (Signed)
Attempting to do a PA for the patient for Ambien. When I enter the information in the system it comes up as Harries smoot being a potential name. I have called to ask the pt if she goes by this also or if smoot should not be included. There was no answer. LVM for the pt to call back and clarify.  IF patient calls can you just ask if her last name use to be Orea-smoot?

## 2017-08-24 NOTE — Telephone Encounter (Signed)
PA COMPLETED FOR THE PATIENT THROUGH [facility] [address] [facility] [address]. INFORMATION WAS PROVIDED BY THE PHARMACY.  RX LOP:[id number] RX GROUP: [id number] PCN: [id number] ID: [id number] WAS APPROVED UNTIL 08/24/2018 THROUGH [facility].  XOG:[id number]

## 2017-08-29 NOTE — Telephone Encounter (Signed)
Pt called back to inform RN Myriam Jacobson that the pharmacy  [facility] [place], [address] [place] [phone] (Phone) [phone] (Fax)     Told her they have not heard from insurance company.  This information was relayed to RN Myriam Jacobson and she stated she will call the pharmacy for pt.  Pt okayed and has not requested a call back

## 2017-08-29 NOTE — Telephone Encounter (Signed)
I have contacted the pharmacy and informed them that the PA was approved until 08/24/2018 through [place]. The pharmacy states that when they run it, it comes back with a rejection and states the patient can only get 90 limit in a years time. I have called the insurance company to bring to their attn since and they state that it needs to be ran as brand name cause that is what the PA was for. I have informed the pharmacy of this and had them process as brand name

## 2017-08-30 NOTE — Telephone Encounter (Signed)
Called the patient to ask if she was able to get her med. No answer. LVM for the pt to call back if she had any problems with getting it filled.

## 2017-09-06 ENCOUNTER — Other Ambulatory Visit: Payer: Self-pay | Admitting: Emergency Medicine

## 2017-09-06 MED ORDER — TELMISARTAN 80 MG PO TABS: 80.0000 mg | ORAL_TABLET | Freq: Every day | ORAL | 1 refills | Status: DC

## 2017-09-08 ENCOUNTER — Telehealth: Payer: Self-pay | Admitting: Emergency Medicine

## 2017-09-08 NOTE — Telephone Encounter (Signed)
Called patient to schedule AWV. Patient did not answer and no voicemail set up.

## 2017-09-15 ENCOUNTER — Other Ambulatory Visit: Payer: Self-pay | Admitting: Internal Medicine

## 2017-09-15 DIAGNOSIS — M5415 Radiculopathy, thoracolumbar region: Secondary | ICD-10-CM | POA: Diagnosis not present

## 2017-09-26 DIAGNOSIS — E669 Obesity, unspecified: Secondary | ICD-10-CM | POA: Diagnosis not present

## 2017-09-26 DIAGNOSIS — M0589 Other rheumatoid arthritis with rheumatoid factor of multiple sites: Secondary | ICD-10-CM | POA: Diagnosis not present

## 2017-09-26 DIAGNOSIS — Z6835 Body mass index (BMI) 35.0-35.9, adult: Secondary | ICD-10-CM | POA: Diagnosis not present

## 2017-09-26 DIAGNOSIS — N183 Chronic kidney disease, stage 3 (moderate): Secondary | ICD-10-CM | POA: Diagnosis not present

## 2017-09-26 DIAGNOSIS — M5136 Other intervertebral disc degeneration, lumbar region: Secondary | ICD-10-CM | POA: Diagnosis not present

## 2017-09-26 DIAGNOSIS — M150 Primary generalized (osteo)arthritis: Secondary | ICD-10-CM | POA: Diagnosis not present

## 2017-09-26 DIAGNOSIS — D860 Sarcoidosis of lung: Secondary | ICD-10-CM | POA: Diagnosis not present

## 2017-10-18 ENCOUNTER — Other Ambulatory Visit: Payer: Self-pay | Admitting: Internal Medicine

## 2017-10-25 ENCOUNTER — Encounter: Payer: Self-pay | Admitting: Neurology

## 2017-10-25 ENCOUNTER — Ambulatory Visit: Payer: Medicare Other | Admitting: Internal Medicine

## 2017-10-25 ENCOUNTER — Ambulatory Visit (INDEPENDENT_AMBULATORY_CARE_PROVIDER_SITE_OTHER): Payer: Medicare Other | Admitting: Neurology

## 2017-10-25 VITALS — BP 140/77 | HR 94 | Ht 64.0 in | Wt 203.0 lb

## 2017-10-25 DIAGNOSIS — G4701 Insomnia due to medical condition: Secondary | ICD-10-CM | POA: Diagnosis not present

## 2017-10-25 DIAGNOSIS — R351 Nocturia: Secondary | ICD-10-CM | POA: Insufficient documentation

## 2017-10-25 DIAGNOSIS — F5104 Psychophysiologic insomnia: Secondary | ICD-10-CM | POA: Diagnosis not present

## 2017-10-25 DIAGNOSIS — G4721 Circadian rhythm sleep disorder, delayed sleep phase type: Secondary | ICD-10-CM

## 2017-10-25 DIAGNOSIS — G8929 Other chronic pain: Secondary | ICD-10-CM

## 2017-10-25 MED ORDER — ZOLPIDEM TARTRATE 10 MG PO TABS: 5.0000 mg | ORAL_TABLET | Freq: Every evening | ORAL | 3 refills | Status: DC | PRN

## 2017-10-25 NOTE — Progress Notes (Signed)
[facility]   Provider:  Dr Brittnae Aschenbrenner Referring Provider: Binnie Rail, MD Primary Care Physician:  Binnie Rail, MD  Chief Complaint  Patient presents with  . Follow-up    pt alone, rm 11. pt still struggles with her sleep.     HPI:  Tina Molina is a 72 y.o. female here for follow up , Insomnia is her main sleep problems since 2012 .   She has a history of sarcoidosis, chronic lymphocytosis, normocytic anemia and transient leukopenia.  MGUS followed by Dr Marin Olp.  The patient has hypertension for many years but had a spell of malignant hypertension in 2013, is now on 5 different antihypertensives, creatinine was 1.3 at the time.   allergic rhinitis, runny nose. She carried a diagnosis of  Chronic insomnia and she has been using Ambien 10 mg with good success.  She has rebound insomnia when not taking the medication she has also alternated with doxylamate , which is Unisom, and Doxepin. Tina Molina tests movements tells me that she still sleeping usually in the dental falling asleep in front of the running TV which of course is not optimal and the treatment of insomnia. I will give her today a booklet about insomnia that suggest some sleep habits to change she also has a circadian rhythm disorder we know that from her years of shift work. Often booklet with explanations regarding this as well. I will be refilling her Ambien today. She advised me that she has started the treatment of rheumatoid arthritis with Dr. Amil Amen and she is now taking Remicade infusions.  She developed some nods last week, after her fourth Iv treatment.. 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 She hasn't acquired circadian rhythm disorder and has also maintained an abnormal night and day rhythm,  since her  sister who  Resides with her had been a night shift worker ,too -  And stays  awake at night as well, watching TV together. She has used doxepin to help her sleep and was a that has also worked quite well for her,  she has used generic modafinil for the daytime(to stay awake) but had one episode of palpitation, it turned out this was on a day when she did not take modafinil.Since March 2013 she has increasing arthritis pain she used NSAIDS for  pain control , until  blood pressure started to rise  again. She reports that a kidney function evaluation and Doppler of the renal artery did not give evidence of a renal origin off her hypertension. She is now longer taking 3 blood pressure medications but 5. We are  meeting today to address the chronic insomnia and medication risks in review of her underlying condition of pulmonary sarcoidosis, obesity and hypertension, underwent a back surgery since her last visit with [place] on 02/15/2012 , and went into acute renal failure and respiratory failure,  and  Was finally discharged from the hospital in November  2013 she had to go through a long rehabilitation process . Last visit 2014 with Dr. Brett Fairy, patient has chronic insomnia related to her circadian rhythm disorder,  attributed to her shift work history of almost 3 decades. The hygiene behavior modifications have been discussed and partially implemented. The patient also is more related to lose weight but has been  experiencing difficulties to exercise do to her a knee injury and back pain. The patient's that failed to mirtazapine and Elavil and had nightmares on these working well for her our surgery milligrams of a generic pills of doxepin. None The patient is sarcoidosis pulmonary with dry eyes and CNS manifestations doxepin is worsening the dry eye component and dry mouth complement The patient is on multiple antihypertensives : fall risk is elevated at 7 point she is encouraged to do daily exercises with the  pulmonary or cardial rehabilitation unit.   Interval history from 25 October 2017, I have the pleasure of meeting with Tina Molina today, meanwhile 72 year old right-hand-dominant African-American female patient who was last seen by Vaughan Browner nurse practitioner last year.  The patient has chronic insomnia but she also reports that she has significant pain interfering with her sleep.  She has an autoimmune condition that causes arthritic pain  (multi-joint pain ) and was on Aranesp infusions,  Now taking leflunomide 20 mg tablets once a day.  She also continues to take Flexeril as needed for muscle spasms, carvedilol, atorvastatin, telmisartan, levothyroxine, hydralazine, gabapentin up to 3 times a day, spironolactone, ranitidine, Ambien as needed 10 mg tablets, Symbicort inhaler as needed for wheezing, multivitamin and is also on vitamin E and D supplements.  Sleep habits: she still goes to bed between 3-4 AM , and she struggles to sleep. Once asleep- she may stay asleep 3 hours , waking hourly after this initial stretch of sleep.  Waking up every hour with the need to go to the bathroom, and after each nocturia she has trouble to go back to sleep.  She will finally rise usually at about 3 or 4 PM.  She will spent on average 12 hours a day in bed, but she may only sleep 5 to 6 hours due to these shift and circadian rhythm she has been diagnosed with a circadian rhythm disorder with delayed sleep phase.  Tina Molina was evaluated in a polysomnography study on 02 September 2015, at the time 72 years of age, she had presented with elevated blood pressures the night of the study 150/68 mmHg.  Her apnea and hypercapnia index was 0.0.  Her sleep efficiency was 17.5% only a total sleep time of only 73 minutes was recorded.  The patient sleep study took place between 1020 and 517 in the morning contrary to her daytime sleep habits with nighttime wakefulness.  Sleep study did not take place at the time she  normally sleeps.   I have to excuse and beg her pardon for that - I think we could do better with a daytime sleep study if she would be willing to arrive here at 6 in the morning or 7 in the morning and sleep she is a day.  This may actually mimic her normal sleep cycle a little bit more.  Alternatively now that we have ruled out sleep apnea she may just have a home sleep test to confirm that there is no other cardiac arrhythmia or hypoxemia at night or during her sleep time.      Review of Systems: Out of a complete 14 system review, the patient complains of only the following symptoms, and all other reviewed systems are negative. circadian shift disorder.  insomnia , she had a back surgery was in 2013, and she feels she has returned to the same symptoms,.. Back pain / joint paint related to rheumatoid and osteoarthritis, the patient is fatigued, still endorses daytime  sleepiness . 08-14-14  Epworth score  Is 5 , her fatigue severity score is 52 up form 43 points. Today's Epworth score was endorsed at 4 points, and her fatigue severity was very elevated at 55 points.  This is in keeping with the last 3 or 4 years.    Social History   Socioeconomic History  . Marital status: Divorced    Spouse name: Not on file  . Number of children: 0  . Years of education: 36  . Highest education level: Not on file  Occupational History  . Occupation: Retired    Fish farm manager: RETIRED  Social Needs  . Financial resource strain: Not on file  . Food insecurity:    Worry: Not on file    Inability: Not on file  . Transportation needs:    Medical: Not on file    Non-medical: Not on file  Tobacco Use  . Smoking status: Former Smoker    Packs/day: 0.50    Years: 20.00    Pack years: 10.00    Types: Cigarettes    Last attempt to quit: 05/17/1997    Years since quitting: 20.4  . Smokeless tobacco: Never Used  Substance and Sexual Activity  . Alcohol use: Yes    Alcohol/week: 0.6 oz    Types: 1 Glasses  of wine per week    Comment: occasionally   . Drug use: No  . Sexual activity: Not on file  Lifestyle  . Physical activity:    Days per week: Not on file    Minutes per session: Not on file  . Stress: Not on file  Relationships  . Social connections:    Talks on phone: Not on file    Gets together: Not on file    Attends religious service: Not on file    Active member of club or organization: Not on file    Attends meetings of clubs or organizations: Not on file    Relationship status: Not on file  . Intimate partner violence:    Fear of current or ex partner: Not on file    Emotionally abused: Not on file    Physically abused: Not on file    Forced sexual activity: Not on file  Other Topics Concern  . Not on file  Social History Narrative   Patient is divorced and lives with her sister. Patient is retired.   Caffeine- sometimes - one cup   Right handed.    Family History  Problem Relation Age of Onset  . Anemia Father   . Arthritis Father   . Heart failure Father 16       No CAD  . Diabetes Mother   . Arthritis Mother   . Glaucoma Mother   . Heart disease Mother 12       No CAD  . Hypertension Brother   . Anemia Brother   . Kidney disease Brother   . Hypertension Sister   . Kidney disease Sister   . Anemia Sister   . Diabetes Maternal Aunt   . Arthritis Paternal Grandmother     Past Medical History:  Diagnosis Date  . Anemia    Associated with leukopenia and lymphocytosis. This is been evaluated by Dr. Marin Olp . Her platelet count has been normal  . Arthritis   . Chronic back pain   . Chronic kidney disease   . Erythropoietin deficiency anemia 02/23/2016  . GERD (gastroesophageal reflux disease)   . Hypertension   . Lymphocytosis   .  MGUS (monoclonal gammopathy of unknown significance) 08/03/2012   IgA 792 11/04/11  . Pneumonia 04/2010   OP  . Renal insufficiency   . Sarcoidosis   . Thyroid disease     Past Surgical History:  Procedure Laterality  Date  . CARPAL TUNNEL RELEASE     Bilateral   . CATARACT EXTRACTION Right 05/2015  . CERVICAL DISCECTOMY  06/2010   Dr.Pool  . CHOLECYSTECTOMY    . COLONOSCOPY  2010  . POSTERIOR LUMBAR FUSION  02/29/2012   Dr Alyson Locket op respiratory & renal compromise  . THYROIDECTOMY  2003   For goiter    Current Outpatient Medications  Medication Sig Dispense Refill  . aspirin 81 MG tablet Take 81 mg by mouth daily.     Marland Kitchen atorvastatin (LIPITOR) 20 MG tablet Take 1 tablet (20 mg total) by mouth daily. 90 tablet 1  . carvedilol (COREG) 25 MG tablet take 1 tablet by mouth twice a day 180 tablet 1  . cyclobenzaprine (FLEXERIL) 10 MG tablet Take 10 mg by mouth 3 (three) times daily as needed for muscle spasms.    Marland Kitchen gabapentin (NEURONTIN) 300 MG capsule TAKE 1 CAPSULE BY MOUTH EVERY 8 HOURS IF NEEDED 90 capsule 0  . hydrALAZINE (APRESOLINE) 50 MG tablet take 1 tablet by mouth three times a day 270 tablet 1  . leflunomide (ARAVA) 20 MG tablet Take 20 mg by mouth daily.    Marland Kitchen levothyroxine (SYNTHROID, LEVOTHROID) 137 MCG tablet TAKE 1 TABLET BY MOUTH ONCE DAILY BEFORE BREAKFAST 90 tablet 0  . Multiple Vitamin (MULTIVITAMIN WITH MINERALS) TABS Take 1 tablet by mouth daily.    . ranitidine (ZANTAC) 150 MG tablet take 1 tablet by mouth twice a day 180 tablet 1  . spironolactone (ALDACTONE) 50 MG tablet take 1/2 tablet by mouth once daily 45 tablet 1  . SYMBICORT 160-4.5 MCG/ACT inhaler inhale 2 puffs if needed for wheezing 10.2 g 0  . telmisartan (MICARDIS) 80 MG tablet Take 1 tablet (80 mg total) by mouth daily. 90 tablet 1  . vitamin E 400 UNIT capsule Take 400 Units by mouth daily.     Marland Kitchen zolpidem (AMBIEN) 10 MG tablet take 1 tablet by mouth at bedtime if needed for sleep 90 tablet 1   No current facility-administered medications for this visit.     Allergies as of 10/25/2017 - Review Complete 10/25/2017  Allergen Reaction Noted  . Penicillins    . Shellfish allergy Rash 08/11/2011  .  Shellfish-derived products Anaphylaxis 11/10/2015  . Sulfonamide derivatives    . Azithromycin  06/27/2014  . Codeine  10/16/2009  . Infliximab  11/10/2015    Vitals: BP 140/77   Pulse 94   Ht 5\' 4"  (1.626 m)   Wt 203 lb (92.1 kg)   BMI 34.84 kg/m  Last Weight:  Wt Readings from Last 1 Encounters:  10/25/17 203 lb (92.1 kg)   Last Height:   Ht Readings from Last 1 Encounters:  10/25/17 5\' 4"  (1.626 m)    Physical exam:  General: The patient is awake, alert and appears not in acute distress. The patient is well groomed. Head: Normocephalic, atraumatic. Neck is supple TMJ click noted, she had bite marks on her tongue- uses a mouth guard.   Mallampati3 neck circumference: 16 (!!) inches- up from 15 , no nasal deviation. No retrognathia. Good nasal airflow patency.  Cardiovascular:  Regular rate and rhythm, without murmurs or carotid bruit, and without distended neck veins. Respiratory: Lungs are clear  to auscultation. No wheezing.   Skin:  Without evidence of edema, or rash Trunk: The BMI is further elevated and patient  has now a stooped  posture. Neurologic exam : The patient is awake and alert, oriented to place and time.  Memory subjective  described as intact. There is a normal attention span & concentration ability. Speech is fluent without  dysarthria, dysphonia or aphasia. Mood and affect are appropriate. Cranial nerves:  No changes in taste and smell.  Pupils are equal and briskly reactive to light. Extraocular movements  in vertical and horizontal planes intact and without nystagmus. Visual fields by finger perimetry are intact. Hearing to finger rub intact.  Facial sensation intact to fine touch. Facial motor strength is symmetric and tongue and uvula move midline.   Motor exam:   Normal tone and normal muscle bulk and symmetric normal strength in upper extremities. Torn meniscus made ROM limited, left knee with crepitation.  Sensory:  Fine touch, pinprick and vibration  were tested in all extremities,  Numbness in both feet, and left L4-5 s1 dermatome.  Proprioception is tested in the upper extremities only. This was normal. Coordination: Rapid alternating movements in the fingers/hands is normal, without evidence of ataxia, dysmetria or tremor. Gait and station: Patient walks without assistive device. Strength within normal limits. Stance is stable and normal. Tandem gait is slowed, deferred- unfragmented turns. Romberg testing is normal.  Assessment : The  patient with chronic insomnia related to a long-standing shift work sleep disorder-circadian rhythm disorder.  In addition high fatigue due to autoimmune disease-rheumatoid arthritis.   The high degree of fatigue as reflected and the fatigue severity scale , while she is not excessively daytime sleepy .  I refilled her Ambien which she is taking chronically.  I gave her information booklets for shift work disorder as well as for insomnia.  I asked her today about any advanced directives that she may have to establish. She stated that she has none at this time and place, but then she also told me that she is now him supposedly legally divorced but she has never seen the divorce- decree.(?)  I have refilled Tina Molina is Ambien she is using 10 mg each night for many years now - we have had yearly, long and detailed discussions about melatonin or doxepin to be used instead of a prescription medicine or to replace the Ambien 2-3 nights out of the week.  This way the Ambien would work better and over a longer period of time.  Please remember to try to maintain good sleep hygiene, which means: Keep a regular sleep and wake schedule, try not to exercise or have a meal within 2 hours of your bedtime, try to keep your bedroom conducive for sleep, that is, cool and dark, without light distractors such as an illuminated alarm clock, and refrain from watching TV right before sleep or in the middle of the night and do not  keep the TV or radio on during the night. Also, try not to use or play on electronic devices at bedtime, such as your cell phone, tablet PC or laptop. If you like to read at bedtime on an electronic device, try to dim the background light as much as possible. Do not eat in the middle of the night.  For chronic insomnia, you are best followed by a psychiatrist and/or sleep psychologist.   Sleep hygiene instructions were given as quoted above - she has received additional  patient instructions.  She  related her insomnia is now often due to pain.   Larey Seat, MD   Diplomate of the ABPN and ABSM, Fellow of the AASM and AAN.  Medical Director of [facility].  10-25-2017    Cc; Unice Cobble, MD  Celso Amy,

## 2017-10-25 NOTE — Patient Instructions (Signed)
Insomnia Insomnia is a sleep disorder that makes it difficult to fall asleep or to stay asleep. Insomnia can cause tiredness (fatigue), low energy, difficulty concentrating, mood swings, and poor performance at work or school. There are three different ways to classify insomnia:  Difficulty falling asleep.  Difficulty staying asleep.  Waking up too early in the morning.  Any type of insomnia can be long-term (chronic) or short-term (acute). Both are common. Short-term insomnia usually lasts for three months or less. Chronic insomnia occurs at least three times a week for longer than three months. What are the causes? Insomnia may be caused by another condition, situation, or substance, such as:  Anxiety.  Certain medicines.  Gastroesophageal reflux disease (GERD) or other gastrointestinal conditions.  Asthma or other breathing conditions.  Restless legs syndrome, sleep apnea, or other sleep disorders.  Chronic pain.  Menopause. This may include hot flashes.  Stroke.  Abuse of alcohol, tobacco, or illegal drugs.  Depression.  Caffeine.  Neurological disorders, such as Alzheimer disease.  An overactive thyroid (hyperthyroidism).  The cause of insomnia may not be known. What increases the risk? Risk factors for insomnia include:  Gender. Women are more commonly affected than men.  Age. Insomnia is more common as you get older.  Stress. This may involve your professional or personal life.  Income. Insomnia is more common in people with lower income.  Lack of exercise.  Irregular work schedule or night shifts.  Traveling between different time zones.  What are the signs or symptoms? If you have insomnia, trouble falling asleep or trouble staying asleep is the main symptom. This may lead to other symptoms, such as:  Feeling fatigued.  Feeling nervous about going to sleep.  Not feeling rested in the morning.  Having trouble concentrating.  Feeling  irritable, anxious, or depressed.  How is this treated? Treatment for insomnia depends on the cause. If your insomnia is caused by an underlying condition, treatment will focus on addressing the condition. Treatment may also include:  Medicines to help you sleep.  Counseling or therapy.  Lifestyle adjustments.  Follow these instructions at home:  Take medicines only as directed by your health care provider.  Keep regular sleeping and waking hours. Avoid naps.  Keep a sleep diary to help you and your health care provider figure out what could be causing your insomnia. Include: ? When you sleep. ? When you wake up during the night. ? How well you sleep. ? How rested you feel the next day. ? Any side effects of medicines you are taking. ? What you eat and drink.  Make your bedroom a comfortable place where it is easy to fall asleep: ? Put up shades or special blackout curtains to block light from outside. ? Use a white noise machine to block noise. ? Keep the temperature cool.  Exercise regularly as directed by your health care provider. Avoid exercising right before bedtime.  Use relaxation techniques to manage stress. Ask your health care provider to suggest some techniques that may work well for you. These may include: ? Breathing exercises. ? Routines to release muscle tension. ? Visualizing peaceful scenes.  Cut back on alcohol, caffeinated beverages, and cigarettes, especially close to bedtime. These can disrupt your sleep.  Do not overeat or eat spicy foods right before bedtime. This can lead to digestive discomfort that can make it hard for you to sleep.  Limit screen use before bedtime. This includes: ? Watching TV. ? Using your smartphone, tablet,  and computer.  Stick to a routine. This can help you fall asleep faster. Try to do a quiet activity, brush your teeth, and go to bed at the same time each night.  Get out of bed if you are still awake after 15 minutes  of trying to sleep. Keep the lights down, but try reading or doing a quiet activity. When you feel sleepy, go back to bed.  Make sure that you drive carefully. Avoid driving if you feel very sleepy.  Keep all follow-up appointments as directed by your health care provider. This is important. Contact a health care provider if:  You are tired throughout the day or have trouble in your daily routine due to sleepiness.  You continue to have sleep problems or your sleep problems get worse. Get help right away if:  You have serious thoughts about hurting yourself or someone else. This information is not intended to replace advice given to you by your health care provider. Make sure you discuss any questions you have with your health care provider. Document Released: 04/30/2000 Document Revised: 10/03/2015 Document Reviewed: 02/01/2014 Elsevier Interactive Patient Education  2018 [employer]. Please remember to try to maintain good sleep hygiene, which means: Keep a regular sleep and wake schedule, try not to exercise or have a meal within 2 hours of your bedtime, try to keep your bedroom conducive for sleep, that is, cool and dark, without light distractors such as an illuminated alarm clock, and refrain from watching TV right before sleep or in the middle of the night and do not keep the TV or radio on during the night. Also, try not to use or play on electronic devices at bedtime, such as your cell phone, tablet PC or laptop. If you like to read at bedtime on an electronic device, try to dim the background light as much as possible. Do not eat in the middle of the night.   We will request a sleep study.    We will look for leg twitching and snoring or sleep apnea.   For chronic insomnia, you are best followed by a psychiatrist and/or sleep psychologist.   We will call you with the sleep study results and make a follow up appointment if needed.

## 2017-10-26 ENCOUNTER — Inpatient Hospital Stay: Payer: Medicare Other | Attending: Family | Admitting: Family

## 2017-10-26 ENCOUNTER — Inpatient Hospital Stay: Payer: Medicare Other

## 2017-10-26 ENCOUNTER — Encounter: Payer: Self-pay | Admitting: Family

## 2017-10-26 ENCOUNTER — Other Ambulatory Visit: Payer: Self-pay

## 2017-10-26 VITALS — BP 117/57 | HR 91 | Temp 98.4°F | Resp 16 | Wt 203.0 lb

## 2017-10-26 DIAGNOSIS — Z7982 Long term (current) use of aspirin: Secondary | ICD-10-CM | POA: Diagnosis not present

## 2017-10-26 DIAGNOSIS — D500 Iron deficiency anemia secondary to blood loss (chronic): Secondary | ICD-10-CM

## 2017-10-26 DIAGNOSIS — D472 Monoclonal gammopathy: Secondary | ICD-10-CM | POA: Insufficient documentation

## 2017-10-26 DIAGNOSIS — D508 Other iron deficiency anemias: Secondary | ICD-10-CM

## 2017-10-26 DIAGNOSIS — N182 Chronic kidney disease, stage 2 (mild): Secondary | ICD-10-CM | POA: Insufficient documentation

## 2017-10-26 DIAGNOSIS — D509 Iron deficiency anemia, unspecified: Secondary | ICD-10-CM | POA: Diagnosis not present

## 2017-10-26 DIAGNOSIS — R5383 Other fatigue: Secondary | ICD-10-CM | POA: Insufficient documentation

## 2017-10-26 DIAGNOSIS — N183 Chronic kidney disease, stage 3 unspecified: Secondary | ICD-10-CM

## 2017-10-26 DIAGNOSIS — N289 Disorder of kidney and ureter, unspecified: Secondary | ICD-10-CM

## 2017-10-26 DIAGNOSIS — M1990 Unspecified osteoarthritis, unspecified site: Secondary | ICD-10-CM | POA: Diagnosis not present

## 2017-10-26 DIAGNOSIS — Z79899 Other long term (current) drug therapy: Secondary | ICD-10-CM | POA: Diagnosis not present

## 2017-10-26 DIAGNOSIS — D631 Anemia in chronic kidney disease: Secondary | ICD-10-CM | POA: Diagnosis not present

## 2017-10-26 LAB — CBC WITH DIFFERENTIAL (CANCER CENTER ONLY)
Basophils Absolute: 0 10*3/uL (ref 0.0–0.1)
Basophils Relative: 1 %
Eosinophils Absolute: 0.2 10*3/uL (ref 0.0–0.5)
Eosinophils Relative: 5 %
HCT: 26.6 % — ABNORMAL LOW (ref 34.8–46.6)
Hemoglobin: 8.6 g/dL — ABNORMAL LOW (ref 11.6–15.9)
Lymphocytes Relative: 40 %
Lymphs Abs: 1.4 10*3/uL (ref 0.9–3.3)
MCH: 33.1 pg (ref 26.0–34.0)
MCHC: 32.3 g/dL (ref 32.0–36.0)
MCV: 102.3 fL — ABNORMAL HIGH (ref 81.0–101.0)
Monocytes Absolute: 0.5 10*3/uL (ref 0.1–0.9)
Monocytes Relative: 14 %
Neutro Abs: 1.4 10*3/uL — ABNORMAL LOW (ref 1.5–6.5)
Neutrophils Relative %: 40 %
Platelet Count: 175 10*3/uL (ref 145–400)
RBC: 2.6 MIL/uL — ABNORMAL LOW (ref 3.70–5.32)
RDW: 13.2 % (ref 11.1–15.7)
WBC Count: 3.5 10*3/uL — ABNORMAL LOW (ref 3.9–10.0)

## 2017-10-26 LAB — CMP (CANCER CENTER ONLY)
ALT: 12 U/L (ref 0–55)
AST: 21 U/L (ref 5–34)
Albumin: 3.8 g/dL (ref 3.5–5.0)
Alkaline Phosphatase: 61 U/L (ref 40–150)
Anion gap: 10 (ref 3–11)
BUN: 18 mg/dL (ref 7–26)
CO2: 26 mmol/L (ref 22–29)
Calcium: 9 mg/dL (ref 8.4–10.4)
Chloride: 107 mmol/L (ref 98–109)
Creatinine: 1.61 mg/dL — ABNORMAL HIGH (ref 0.60–1.10)
GFR, Est AFR Am: 36 mL/min — ABNORMAL LOW (ref >60)
GFR, Estimated: 31 mL/min — ABNORMAL LOW (ref >60)
Glucose, Bld: 96 mg/dL (ref 70–140)
Potassium: 3.7 mmol/L (ref 3.5–5.1)
Sodium: 143 mmol/L (ref 136–145)
Total Bilirubin: 0.3 mg/dL (ref 0.2–1.2)
Total Protein: 7.2 g/dL (ref 6.4–8.3)

## 2017-10-26 LAB — RETICULOCYTES
RBC.: 2.59 MIL/uL — ABNORMAL LOW (ref 3.70–5.45)
Retic Count, Absolute: 44 10*3/uL (ref 33.7–90.7)
Retic Ct Pct: 1.7 % (ref 0.7–2.1)

## 2017-10-26 MED ORDER — SODIUM CHLORIDE 0.9 % IV SOLN: Freq: Once | INTRAVENOUS | Status: DC

## 2017-10-26 MED ORDER — SODIUM CHLORIDE 0.9 % IV SOLN: 510.0000 mg | Freq: Once | INTRAVENOUS | Status: DC

## 2017-10-26 MED ORDER — DARBEPOETIN ALFA 300 MCG/0.6ML IJ SOSY
PREFILLED_SYRINGE | INTRAMUSCULAR | Status: AC
  Filled 2017-10-26: qty 0.6

## 2017-10-26 MED ORDER — DARBEPOETIN ALFA 300 MCG/0.6ML IJ SOSY
300.0000 ug | PREFILLED_SYRINGE | Freq: Once | INTRAMUSCULAR | Status: AC
  Administered 2017-10-26: 300 ug via SUBCUTANEOUS

## 2017-10-26 NOTE — Patient Instructions (Signed)
Anemia Anemia is a condition in which you do not have enough red blood cells or hemoglobin. Hemoglobin is a substance in red blood cells that carries oxygen. When you do not have enough red blood cells or hemoglobin (are anemic), your body cannot get enough oxygen and your organs may not work properly. As a result, you may feel very tired or have other problems. What are the causes? Common causes of anemia include:  Excessive bleeding. Anemia can be caused by excessive bleeding inside or outside the body, including bleeding from the intestine or from periods in women.  Poor nutrition.  Long-lasting (chronic) kidney, thyroid, and liver disease.  Bone marrow disorders.  Cancer and treatments for cancer.  HIV (human immunodeficiency virus) and AIDS (acquired immunodeficiency syndrome).  Treatments for HIV and AIDS.  Spleen problems.  Blood disorders.  Infections, medicines, and autoimmune disorders that destroy red blood cells.  What are the signs or symptoms? Symptoms of this condition include:  Minor weakness.  Dizziness.  Headache.  Feeling heartbeats that are irregular or faster than normal (palpitations).  Shortness of breath, especially with exercise.  Paleness.  Cold sensitivity.  Indigestion.  Nausea.  Difficulty sleeping.  Difficulty concentrating.  Symptoms may occur suddenly or develop slowly. If your anemia is mild, you may not have symptoms. How is this diagnosed? This condition is diagnosed based on:  Blood tests.  Your medical history.  A physical exam.  Bone marrow biopsy.  Your health care provider may also check your stool (feces) for blood and may do additional testing to look for the cause of your bleeding. You may also have other tests, including:  Imaging tests, such as a CT scan or MRI.  Endoscopy.  Colonoscopy.  How is this treated? Treatment for this condition depends on the cause. If you continue to lose a lot of blood,  you may need to be treated at a hospital. Treatment may include:  Taking supplements of iron, vitamin B12, or folic acid.  Taking a hormone medicine (erythropoietin) that can help to stimulate red blood cell growth.  Having a blood transfusion. This may be needed if you lose a lot of blood.  Making changes to your diet.  Having surgery to remove your spleen.  Follow these instructions at home:  Take over-the-counter and prescription medicines only as told by your health care provider.  Take supplements only as told by your health care provider.  Follow any diet instructions that you were given.  Keep all follow-up visits as told by your health care provider. This is important. Contact a health care provider if:  You develop new bleeding anywhere in the body. Get help right away if:  You are very weak.  You are short of breath.  You have pain in your abdomen or chest.  You are dizzy or feel faint.  You have trouble concentrating.  You have bloody or black, tarry stools.  You vomit repeatedly or you vomit up blood. Summary  Anemia is a condition in which you do not have enough red blood cells or enough of a substance in your red blood cells that carries oxygen (hemoglobin).  Symptoms may occur suddenly or develop slowly.  If your anemia is mild, you may not have symptoms.  This condition is diagnosed with blood tests as well as a medical history and physical exam. Other tests may be needed.  Treatment for this condition depends on the cause of the anemia. This information is not intended to replace advice   given to you by your health care provider. Make sure you discuss any questions you have with your health care provider. Document Released: 06/10/2004 Document Revised: 06/04/2016 Document Reviewed: 06/04/2016 Elsevier Interactive Patient Education  [employer].

## 2017-10-26 NOTE — Progress Notes (Signed)
Hematology and Oncology Follow Up Visit  Tina Molina [id number] October 31, 1945 72 y.o. 10/26/2017   Principle Diagnosis:  Anemia of chronic renal failure - stage II MGUS - IgA lambda Iron deficiency anemia  Current Therapy:   Aranesp 300 mcg SQ to keep Hgb > 11 IV iron as indicated - last received in December 2017   Interim History:  Tina Molina is here today for follow-up. She is symptomatic with fatigue. Her Hgb is 8.6, MCV 102.  It is hard for her to find a ride here and her appointments have been every 4 months. We will see about arranging for lab and injection at [facility]. She would like to stay here for provider visits.  Her IgA level in February was 520 and lambda light chain was 2.88 mg/dL.  She has had no fever, chills, n/v, cough, rash, dizziness, SOB, chest pain, palpitations, abdominal pain or changes in bowel or bladder habits.  The puffiness in her hands and feet comes and goes. She feels that this is due to arthritis.  Numbness and tingling in her toes due to back surgery is unchanged.  She has a good appetite and is staying well hydrated. Her weight is stable.   ECOG Performance Status: 1 - Symptomatic but completely ambulatory  Medications:  Allergies as of 10/26/2017      Reactions   Penicillins    Intolerance or allergy not remembered; her sister stated penicillin caused "boils under the skin"   Shellfish Allergy Rash   Shellfish-derived Products Anaphylaxis   Sulfonamide Derivatives    Arthralgias; her sister stated that this caused a rash.   Azithromycin    itching   Codeine    Nausea only with liquid codeine   Infliximab    Other reaction(s): Confusion (intolerance)      Medication List        Accurate as of 10/26/17  3:19 PM. Always use your most recent med list.          aspirin 81 MG tablet Take 81 mg by mouth daily.   atorvastatin 20 MG tablet Commonly known as:  LIPITOR Take 1 tablet (20 mg total) by mouth daily.   carvedilol 25  MG tablet Commonly known as:  COREG take 1 tablet by mouth twice a day   cyclobenzaprine 10 MG tablet Commonly known as:  FLEXERIL Take 10 mg by mouth 3 (three) times daily as needed for muscle spasms.   gabapentin 300 MG capsule Commonly known as:  NEURONTIN TAKE 1 CAPSULE BY MOUTH EVERY 8 HOURS IF NEEDED   hydrALAZINE 50 MG tablet Commonly known as:  APRESOLINE take 1 tablet by mouth three times a day   leflunomide 20 MG tablet Commonly known as:  ARAVA Take 20 mg by mouth daily.   levothyroxine 137 MCG tablet Commonly known as:  SYNTHROID, LEVOTHROID TAKE 1 TABLET BY MOUTH ONCE DAILY BEFORE BREAKFAST   multivitamin with minerals Tabs tablet Take 1 tablet by mouth daily.   ranitidine 150 MG tablet Commonly known as:  ZANTAC take 1 tablet by mouth twice a day   spironolactone 50 MG tablet Commonly known as:  ALDACTONE take 1/2 tablet by mouth once daily   SYMBICORT 160-4.5 MCG/ACT inhaler Generic drug:  budesonide-formoterol inhale 2 puffs if needed for wheezing   telmisartan 80 MG tablet Commonly known as:  MICARDIS Take 1 tablet (80 mg total) by mouth daily.   vitamin E 400 UNIT capsule Take 400 Units by mouth daily.   zolpidem  10 MG tablet Commonly known as:  AMBIEN Take 0.5 tablets (5 mg total) by mouth at bedtime as needed for sleep.       Allergies:  Allergies  Allergen Reactions  . Penicillins     Intolerance or allergy not remembered; her sister stated penicillin caused "boils under the skin"  . Shellfish Allergy Rash  . Shellfish-Derived Products Anaphylaxis  . Sulfonamide Derivatives     Arthralgias; her sister stated that this caused a rash.  . Azithromycin     itching  . Codeine     Nausea only with liquid codeine  . Infliximab     Other reaction(s): Confusion (intolerance)    Past Medical History, Surgical history, Social history, and Family History were reviewed and updated.  Review of Systems: All other 10 point review of  systems is negative.   Physical Exam:  weight is 203 lb (92.1 kg). Her oral temperature is 98.4 F (36.9 C). Her blood pressure is 117/57 (abnormal) and her pulse is 91. Her respiration is 16 and oxygen saturation is 97%.   Wt Readings from Last 3 Encounters:  10/26/17 203 lb (92.1 kg)  10/25/17 203 lb (92.1 kg)  06/28/17 204 lb 12 oz (92.9 kg)    Ocular: Sclerae unicteric, pupils equal, round and reactive to light Ear-nose-throat: Oropharynx clear, dentition fair Lymphatic: No cervical, supraclavicular or axillary adenopathy Lungs no rales or rhonchi, good excursion bilaterally Heart regular rate and rhythm, no murmur appreciated Abd soft, nontender, positive bowel sounds, no liver or spleen tip palpated on exam, no fluid wave  MSK no focal spinal tenderness, no joint edema Neuro: non-focal, well-oriented, appropriate affect Breasts: Deferred   Lab Results  Component Value Date   WBC 3.5 (L) 10/26/2017   HGB 8.6 (L) 10/26/2017   HCT 26.6 (L) 10/26/2017   MCV 102.3 (H) 10/26/2017   PLT 175 10/26/2017   Lab Results  Component Value Date   FERRITIN 197 06/28/2017   IRON 103 06/28/2017   TIBC 279 06/28/2017   UIBC 176 06/28/2017   IRONPCTSAT 37 06/28/2017   Lab Results  Component Value Date   RETICCTPCT 2.1 06/28/2017   RBC 2.60 (L) 10/26/2017   RETICCTABS 51.6 01/03/2013   Lab Results  Component Value Date   KPAFRELGTCHN 17.6 06/28/2017   LAMBDASER 28.8 (H) 06/28/2017   KAPLAMBRATIO 0.61 06/28/2017   Lab Results  Component Value Date   IGGSERUM 979 06/28/2017   IGA 520 (H) 06/28/2017   IGMSERUM 25 (L) 06/28/2017   Lab Results  Component Value Date   TOTALPROTELP 6.7 06/28/2017   ALBUMINELP 3.7 06/28/2017   A1GS 0.2 06/28/2017   A2GS 0.6 06/28/2017   BETS 1.3 06/28/2017   BETA2SER 10.3 (H) 11/04/2011   GAMS 0.9 06/28/2017   MSPIKE 0.3 (H) 06/28/2017   SPEI Comment 06/28/2017     Chemistry      Component Value Date/Time   NA 145 06/28/2017 1259   NA  150 (H) 03/01/2017 1308   NA 143 04/05/2016 1156   K 3.5 06/28/2017 1259   K 4.0 03/01/2017 1308   K 3.8 04/05/2016 1156   CL 109 (H) 06/28/2017 1259   CL 109 (H) 03/01/2017 1308   CO2 26 06/28/2017 1259   CO2 28 03/01/2017 1308   CO2 21 (L) 04/05/2016 1156   BUN 25 (H) 06/28/2017 1259   BUN 19 03/01/2017 1308   BUN 18.5 04/05/2016 1156   CREATININE 1.70 (H) 06/28/2017 1259   CREATININE 1.8 (H) 03/01/2017 1308  CREATININE 1.5 (H) 04/05/2016 1156      Component Value Date/Time   CALCIUM 9.3 06/28/2017 1259   CALCIUM 9.7 03/01/2017 1308   CALCIUM 9.3 04/05/2016 1156   ALKPHOS 63 06/28/2017 1259   ALKPHOS 55 03/01/2017 1308   ALKPHOS 77 04/05/2016 1156   AST 23 06/28/2017 1259   AST 21 04/05/2016 1156   ALT 16 06/28/2017 1259   ALT 18 03/01/2017 1308   ALT 22 04/05/2016 1156   BILITOT 0.7 06/28/2017 1259   BILITOT 0.36 04/05/2016 1156      Impression and Plan: Ms. Mcneely is a very pleasant 72 yo African American female with multifactorial anemia as well as an IgA lambda MGUS.  Protein studies are pending.  Hgb is 8.6 so we did give her Aranesp while she was here.  We will see what her iron studies show and bring her back in for infusion if needed.  We will get her set up for lab and injection if needed every 2 months at [facility].  We will plan to see her back in another 6 months for follow-up.  She will contact our office with any questions or concerns. We can certainly see her sooner if need be.   Laverna Peace, NP 6/12/20193:19 PM

## 2017-10-27 LAB — KAPPA/LAMBDA LIGHT CHAINS
Kappa free light chain: 18.4 mg/L (ref 3.3–19.4)
Kappa, lambda light chain ratio: 0.62 (ref 0.26–1.65)
Lambda free light chains: 29.8 mg/L — ABNORMAL HIGH (ref 5.7–26.3)

## 2017-10-27 LAB — IGG, IGA, IGM
IgA: 495 mg/dL — ABNORMAL HIGH (ref 64–422)
IgG (Immunoglobin G), Serum: 908 mg/dL (ref 700–1600)
IgM (Immunoglobulin M), Srm: 27 mg/dL (ref 26–217)

## 2017-10-27 LAB — IRON AND TIBC
Iron: 98 ug/dL (ref 41–142)
Saturation Ratios: 36 % (ref 21–57)
TIBC: 269 ug/dL (ref 236–444)
UIBC: 171 ug/dL

## 2017-10-27 LAB — FERRITIN: Ferritin: 223 ng/mL (ref 9–269)

## 2017-10-27 NOTE — Patient Instructions (Addendum)
  Medications reviewed and updated.  No changes recommended at this time.  Your prescription(s) have been submitted to your pharmacy. Please take as directed and contact our office if you believe you are having problem(s) with the medication(s).    Please followup in 6 months   

## 2017-10-27 NOTE — Progress Notes (Signed)
Subjective:    Patient ID: Tina Molina, female    DOB: [birth year], 72 y.o.   MRN: [id number]  HPI The patient is here for follow up.  She has swelling in her hands and legs.  It usually goes away over night.  She is not compliant with a low sodium diet.  She eats fast food - hamburgers/french fries  - maybe twice a week. Her sister cooks otherwise.  She does not check food labels and is unsure how much sodium she eats.  She is too tired to exercise.    Hypertension: She is taking her medication daily. She is not compliant with a low sodium diet.  She denies chest pain, and regular headaches. She is not exercising regularly.  She does monitor her blood pressure at home - 140's/70's..    Hypothyroidism:  She is taking her medication daily.  She denies any recent changes in energy or weight that are unexplained.   Hyperlipidemia: She is taking her medication daily. She is compliant with a low fat/cholesterol diet. She is not exercising regularly. She denies myalgias.   GERD:  She is taking her medication daily as prescribed.  She denies any GERD symptoms and feels her GERD is well controlled.    Medications and allergies reviewed with patient and updated if appropriate.  Patient Active Problem List   Diagnosis Date Noted  . Nocturia more than twice per night 10/25/2017  . Insomnia secondary to chronic pain 10/25/2017  . Upper back pain on left side 04/20/2017  . IDA (iron deficiency anemia) 04/06/2016  . Erythropoietin deficiency anemia 02/23/2016  . Chronic insomnia 08/12/2015  . GERD (gastroesophageal reflux disease) 06/25/2015  . Hyperlipidemia 01/13/2015  . Hyperglycemia 01/13/2015  . Circadian rhythm sleep disorder 08/14/2014  . Rheumatoid factor positive 02/20/2014  . Renal insufficiency 11/03/2013  . Thoracic radiculopathy 10/30/2013  . Circadian rhythm sleep disorder, shift work type 11/03/2012  . Hematuria 08/05/2012  . MGUS (monoclonal gammopathy of unknown  significance) 08/03/2012  . Lumbar stenosis with neurogenic claudication 02/29/2012  . Edema 09/13/2011  . Abnormal complete blood count 05/27/2011  . CKD (chronic kidney disease) stage 3, GFR 30-59 ml/min (HCC) 12/19/2009  . HERNIATED LUMBAR DISC 12/19/2009  . PULMONARY SARCOIDOSIS 10/16/2009  . Hypothyroidism 10/16/2009  . Anemia associated with stage 2 chronic renal failure 10/16/2009  . CARPAL TUNNEL SYNDROME, BILATERAL 10/16/2009  . UVEITIS 10/16/2009  . Essential hypertension 10/16/2009  . ARTHRITIS 10/16/2009  . CERVICAL DISC DISORDER 10/16/2009    Current Outpatient Medications on File Prior to Visit  Medication Sig Dispense Refill  . aspirin 81 MG tablet Take 81 mg by mouth daily.     Marland Kitchen atorvastatin (LIPITOR) 20 MG tablet Take 1 tablet (20 mg total) by mouth daily. 90 tablet 1  . carvedilol (COREG) 25 MG tablet take 1 tablet by mouth twice a day 180 tablet 1  . cyclobenzaprine (FLEXERIL) 10 MG tablet Take 10 mg by mouth 3 (three) times daily as needed for muscle spasms.    Marland Kitchen gabapentin (NEURONTIN) 300 MG capsule TAKE 1 CAPSULE BY MOUTH EVERY 8 HOURS IF NEEDED 90 capsule 0  . hydrALAZINE (APRESOLINE) 50 MG tablet take 1 tablet by mouth three times a day 270 tablet 1  . leflunomide (ARAVA) 20 MG tablet Take 20 mg by mouth daily.    Marland Kitchen levothyroxine (SYNTHROID, LEVOTHROID) 137 MCG tablet TAKE 1 TABLET BY MOUTH ONCE DAILY BEFORE BREAKFAST 90 tablet 0  . Multiple Vitamin (MULTIVITAMIN WITH MINERALS)  TABS Take 1 tablet by mouth daily.    . ranitidine (ZANTAC) 150 MG tablet take 1 tablet by mouth twice a day 180 tablet 1  . spironolactone (ALDACTONE) 50 MG tablet take 1/2 tablet by mouth once daily 45 tablet 1  . SYMBICORT 160-4.5 MCG/ACT inhaler inhale 2 puffs if needed for wheezing 10.2 g 0  . telmisartan (MICARDIS) 80 MG tablet Take 1 tablet (80 mg total) by mouth daily. 90 tablet 1  . vitamin E 400 UNIT capsule Take 400 Units by mouth daily.     Marland Kitchen zolpidem (AMBIEN) 10 MG tablet  Take 0.5 tablets (5 mg total) by mouth at bedtime as needed for sleep. 45 tablet 3  . [DISCONTINUED] modafinil (PROVIGIL) 100 MG tablet Take 100 mg by mouth daily.    . [DISCONTINUED] potassium chloride (MICRO-K) 10 MEQ CR capsule Take 1 capsule (10 mEq total) by mouth 2 (two) times daily. 180 capsule 1   No current facility-administered medications on file prior to visit.     Past Medical History:  Diagnosis Date  . Anemia    Associated with leukopenia and lymphocytosis. This is been evaluated by Dr. Marin Olp . Her platelet count has been normal  . Arthritis   . Chronic back pain   . Chronic kidney disease   . Erythropoietin deficiency anemia 02/23/2016  . GERD (gastroesophageal reflux disease)   . Hypertension   . Lymphocytosis   . MGUS (monoclonal gammopathy of unknown significance) 08/03/2012   IgA 792 11/04/11  . Pneumonia 04/2010   OP  . Renal insufficiency   . Sarcoidosis   . Thyroid disease     Past Surgical History:  Procedure Laterality Date  . CARPAL TUNNEL RELEASE     Bilateral   . CATARACT EXTRACTION Right 05/2015  . CERVICAL DISCECTOMY  06/2010   Dr.Pool  . CHOLECYSTECTOMY    . COLONOSCOPY  2010  . POSTERIOR LUMBAR FUSION  02/29/2012   Dr Alyson Locket op respiratory & renal compromise  . THYROIDECTOMY  2003   For goiter    Social History   Socioeconomic History  . Marital status: Divorced    Spouse name: Not on file  . Number of children: 0  . Years of education: 65  . Highest education level: Not on file  Occupational History  . Occupation: Retired    Fish farm manager: RETIRED  Social Needs  . Financial resource strain: Not on file  . Food insecurity:    Worry: Not on file    Inability: Not on file  . Transportation needs:    Medical: Not on file    Non-medical: Not on file  Tobacco Use  . Smoking status: Former Smoker    Packs/day: 0.50    Years: 20.00    Pack years: 10.00    Types: Cigarettes    Last attempt to quit: 05/17/1997    Years since  quitting: 20.4  . Smokeless tobacco: Never Used  Substance and Sexual Activity  . Alcohol use: Yes    Alcohol/week: 0.6 oz    Types: 1 Glasses of wine per week    Comment: occasionally   . Drug use: No  . Sexual activity: Not on file  Lifestyle  . Physical activity:    Days per week: Not on file    Minutes per session: Not on file  . Stress: Not on file  Relationships  . Social connections:    Talks on phone: Not on file    Gets together: Not on file  Attends religious service: Not on file    Active member of club or organization: Not on file    Attends meetings of clubs or organizations: Not on file    Relationship status: Not on file  Other Topics Concern  . Not on file  Social History Narrative   Patient is divorced and lives with her sister. Patient is retired.   Caffeine- sometimes - one cup   Right handed.    Family History  Problem Relation Age of Onset  . Anemia Father   . Arthritis Father   . Heart failure Father 76       No CAD  . Diabetes Mother   . Arthritis Mother   . Glaucoma Mother   . Heart disease Mother 78       No CAD  . Hypertension Brother   . Anemia Brother   . Kidney disease Brother   . Hypertension Sister   . Kidney disease Sister   . Anemia Sister   . Diabetes Maternal Aunt   . Arthritis Paternal Grandmother     Review of Systems  Constitutional: Negative for chills and fever.  Respiratory: Positive for shortness of breath (with walking) and wheezing (at night - little). Negative for cough.   Cardiovascular: Positive for palpitations (occ) and leg swelling. Negative for chest pain.  Gastrointestinal:       No gerd - controlled  Neurological: Negative for light-headedness and headaches.       Objective:   Vitals:   10/28/17 1416  BP: (!) 164/86  Pulse: 95  Resp: 16  Temp: 98.2 F (36.8 C)  SpO2: 96%   BP Readings from Last 3 Encounters:  10/28/17 (!) 164/86  10/26/17 (!) 117/57  10/25/17 140/77   Wt Readings from  Last 3 Encounters:  10/28/17 204 lb (92.5 kg)  10/26/17 203 lb (92.1 kg)  10/25/17 203 lb (92.1 kg)   Body mass index is 35.02 kg/m.   Physical Exam    Constitutional: Appears well-developed and well-nourished. No distress.  HENT:  Head: Normocephalic and atraumatic.  Neck: Neck supple. No tracheal deviation present. No thyromegaly present.  No cervical lymphadenopathy Cardiovascular: Normal rate, regular rhythm and normal heart sounds.   2/6 systolic murmur heard. No carotid bruit .  Mild bilateral hand and ankle edema Pulmonary/Chest: Effort normal and breath sounds normal. No respiratory distress. No has no wheezes. No rales.  Skin: Skin is warm and dry. Not diaphoretic.  Psychiatric: Normal mood and affect. Behavior is normal.      Assessment & Plan:    See Problem List for Assessment and Plan of chronic medical problems.

## 2017-10-28 ENCOUNTER — Encounter: Payer: Self-pay | Admitting: Internal Medicine

## 2017-10-28 ENCOUNTER — Ambulatory Visit (INDEPENDENT_AMBULATORY_CARE_PROVIDER_SITE_OTHER): Payer: Medicare Other | Admitting: Internal Medicine

## 2017-10-28 VITALS — BP 164/86 | HR 95 | Temp 98.2°F | Resp 16 | Wt 204.0 lb

## 2017-10-28 DIAGNOSIS — E890 Postprocedural hypothyroidism: Secondary | ICD-10-CM | POA: Diagnosis not present

## 2017-10-28 DIAGNOSIS — K219 Gastro-esophageal reflux disease without esophagitis: Secondary | ICD-10-CM | POA: Diagnosis not present

## 2017-10-28 DIAGNOSIS — N183 Chronic kidney disease, stage 3 unspecified: Secondary | ICD-10-CM

## 2017-10-28 DIAGNOSIS — I10 Essential (primary) hypertension: Secondary | ICD-10-CM | POA: Diagnosis not present

## 2017-10-28 DIAGNOSIS — E7849 Other hyperlipidemia: Secondary | ICD-10-CM | POA: Diagnosis not present

## 2017-10-28 LAB — PROTEIN ELECTROPHORESIS, SERUM
A/G Ratio: 1.5 (ref 0.7–1.7)
Albumin ELP: 4 g/dL (ref 2.9–4.4)
Alpha-1-Globulin: 0.2 g/dL (ref 0.0–0.4)
Alpha-2-Globulin: 0.6 g/dL (ref 0.4–1.0)
Beta Globulin: 1.2 g/dL (ref 0.7–1.3)
Gamma Globulin: 0.7 g/dL (ref 0.4–1.8)
Globulin, Total: 2.7 g/dL (ref 2.2–3.9)
M-Spike, %: 0.1 g/dL — ABNORMAL HIGH
Total Protein ELP: 6.7 g/dL (ref 6.0–8.5)

## 2017-10-28 MED ORDER — TELMISARTAN 80 MG PO TABS: 80.0000 mg | ORAL_TABLET | Freq: Every day | ORAL | 1 refills | Status: DC

## 2017-10-28 MED ORDER — SPIRONOLACTONE 50 MG PO TABS: 25.0000 mg | ORAL_TABLET | Freq: Every day | ORAL | 1 refills | Status: DC

## 2017-10-28 NOTE — Assessment & Plan Note (Signed)
Continue statin Will check lipid panel, CMP at next visit since she had blood work done not too long ago

## 2017-10-28 NOTE — Assessment & Plan Note (Signed)
Check tsh  Titrate med dose if needed  

## 2017-10-28 NOTE — Assessment & Plan Note (Signed)
BP Readings from Last 3 Encounters:  10/28/17 (!) 164/86  10/26/17 (!) 117/57  10/25/17 140/77   Blood pressure has been controlled, but is elevated today-she states she took her medication later than usual She needs to continue monitoring this at home May need to increase medication if blood pressure is not tolerated Saw nephrology earlier this year Stressed low-sodium diet, which she is not compliant with Stressed the importance of regular exercise-advised this will improve her energy level

## 2017-10-28 NOTE — Assessment & Plan Note (Signed)
Following with nephrology-saw them last February 2019 She is retaining some fluid-likely related to noncompliance with low-sodium diet Blood pressure slightly elevated here today Stressed low-sodium diet If edema persists will need to return sooner or will need to see nephrology

## 2017-10-28 NOTE — Assessment & Plan Note (Signed)
GERD controlled Continue daily medication  

## 2017-11-03 ENCOUNTER — Other Ambulatory Visit: Payer: Self-pay | Admitting: Internal Medicine

## 2017-11-08 DIAGNOSIS — H40053 Ocular hypertension, bilateral: Secondary | ICD-10-CM | POA: Diagnosis not present

## 2017-12-15 DIAGNOSIS — I129 Hypertensive chronic kidney disease with stage 1 through stage 4 chronic kidney disease, or unspecified chronic kidney disease: Secondary | ICD-10-CM | POA: Diagnosis not present

## 2017-12-15 DIAGNOSIS — M069 Rheumatoid arthritis, unspecified: Secondary | ICD-10-CM | POA: Diagnosis not present

## 2017-12-15 DIAGNOSIS — D472 Monoclonal gammopathy: Secondary | ICD-10-CM | POA: Diagnosis not present

## 2017-12-15 DIAGNOSIS — N183 Chronic kidney disease, stage 3 (moderate): Secondary | ICD-10-CM | POA: Diagnosis not present

## 2017-12-17 ENCOUNTER — Other Ambulatory Visit: Payer: Self-pay | Admitting: Internal Medicine

## 2017-12-17 DIAGNOSIS — I10 Essential (primary) hypertension: Secondary | ICD-10-CM

## 2017-12-21 ENCOUNTER — Other Ambulatory Visit: Payer: Self-pay | Admitting: Internal Medicine

## 2017-12-26 ENCOUNTER — Other Ambulatory Visit: Payer: Self-pay | Admitting: Internal Medicine

## 2017-12-28 ENCOUNTER — Inpatient Hospital Stay: Payer: Medicare Other

## 2017-12-28 ENCOUNTER — Inpatient Hospital Stay: Payer: Medicare Other | Attending: Family

## 2017-12-28 VITALS — BP 130/77 | HR 91 | Temp 98.0°F | Resp 18

## 2017-12-28 DIAGNOSIS — D631 Anemia in chronic kidney disease: Secondary | ICD-10-CM

## 2017-12-28 DIAGNOSIS — N182 Chronic kidney disease, stage 2 (mild): Secondary | ICD-10-CM | POA: Diagnosis not present

## 2017-12-28 DIAGNOSIS — N289 Disorder of kidney and ureter, unspecified: Secondary | ICD-10-CM

## 2017-12-28 DIAGNOSIS — I129 Hypertensive chronic kidney disease with stage 1 through stage 4 chronic kidney disease, or unspecified chronic kidney disease: Secondary | ICD-10-CM | POA: Diagnosis not present

## 2017-12-28 DIAGNOSIS — D472 Monoclonal gammopathy: Secondary | ICD-10-CM | POA: Insufficient documentation

## 2017-12-28 DIAGNOSIS — D508 Other iron deficiency anemias: Secondary | ICD-10-CM

## 2017-12-28 LAB — CBC WITH DIFFERENTIAL (CANCER CENTER ONLY)
Basophils Absolute: 0 10*3/uL (ref 0.0–0.1)
Basophils Relative: 1 %
Eosinophils Absolute: 0.2 10*3/uL (ref 0.0–0.5)
Eosinophils Relative: 5 %
HCT: 29 % — ABNORMAL LOW (ref 34.8–46.6)
Hemoglobin: 9.6 g/dL — ABNORMAL LOW (ref 11.6–15.9)
Lymphocytes Relative: 51 %
Lymphs Abs: 1.7 10*3/uL (ref 0.9–3.3)
MCH: 31.8 pg (ref 25.1–34.0)
MCHC: 33.1 g/dL (ref 31.5–36.0)
MCV: 96 fL (ref 79.5–101.0)
Monocytes Absolute: 0.4 10*3/uL (ref 0.1–0.9)
Monocytes Relative: 13 %
Neutro Abs: 1 10*3/uL — ABNORMAL LOW (ref 1.5–6.5)
Neutrophils Relative %: 30 %
Platelet Count: 168 10*3/uL (ref 145–400)
RBC: 3.02 MIL/uL — ABNORMAL LOW (ref 3.70–5.45)
RDW: 14.1 % (ref 11.2–14.5)
WBC Count: 3.3 10*3/uL — ABNORMAL LOW (ref 3.9–10.3)

## 2017-12-28 MED ORDER — DARBEPOETIN ALFA 300 MCG/0.6ML IJ SOSY
300.0000 ug | PREFILLED_SYRINGE | Freq: Once | INTRAMUSCULAR | Status: AC
  Administered 2017-12-28: 300 ug via SUBCUTANEOUS

## 2017-12-28 NOTE — Patient Instructions (Signed)

## 2018-01-04 DIAGNOSIS — M150 Primary generalized (osteo)arthritis: Secondary | ICD-10-CM | POA: Diagnosis not present

## 2018-01-04 DIAGNOSIS — M5136 Other intervertebral disc degeneration, lumbar region: Secondary | ICD-10-CM | POA: Diagnosis not present

## 2018-01-04 DIAGNOSIS — M0589 Other rheumatoid arthritis with rheumatoid factor of multiple sites: Secondary | ICD-10-CM | POA: Diagnosis not present

## 2018-01-04 DIAGNOSIS — Z6834 Body mass index (BMI) 34.0-34.9, adult: Secondary | ICD-10-CM | POA: Diagnosis not present

## 2018-01-04 DIAGNOSIS — D860 Sarcoidosis of lung: Secondary | ICD-10-CM | POA: Diagnosis not present

## 2018-01-04 DIAGNOSIS — E669 Obesity, unspecified: Secondary | ICD-10-CM | POA: Diagnosis not present

## 2018-01-04 DIAGNOSIS — N183 Chronic kidney disease, stage 3 (moderate): Secondary | ICD-10-CM | POA: Diagnosis not present

## 2018-01-14 ENCOUNTER — Other Ambulatory Visit: Payer: Self-pay | Admitting: Internal Medicine

## 2018-02-10 ENCOUNTER — Other Ambulatory Visit: Payer: Self-pay | Admitting: Internal Medicine

## 2018-03-01 ENCOUNTER — Inpatient Hospital Stay: Payer: Medicare Other | Attending: Family

## 2018-03-01 ENCOUNTER — Inpatient Hospital Stay: Payer: Medicare Other

## 2018-03-01 VITALS — BP 116/59 | HR 102 | Temp 98.1°F | Resp 18

## 2018-03-01 DIAGNOSIS — D631 Anemia in chronic kidney disease: Secondary | ICD-10-CM

## 2018-03-01 DIAGNOSIS — N182 Chronic kidney disease, stage 2 (mild): Secondary | ICD-10-CM

## 2018-03-01 DIAGNOSIS — N289 Disorder of kidney and ureter, unspecified: Secondary | ICD-10-CM

## 2018-03-01 DIAGNOSIS — I129 Hypertensive chronic kidney disease with stage 1 through stage 4 chronic kidney disease, or unspecified chronic kidney disease: Secondary | ICD-10-CM | POA: Insufficient documentation

## 2018-03-01 DIAGNOSIS — D472 Monoclonal gammopathy: Secondary | ICD-10-CM | POA: Insufficient documentation

## 2018-03-01 DIAGNOSIS — D508 Other iron deficiency anemias: Secondary | ICD-10-CM

## 2018-03-01 LAB — CBC WITH DIFFERENTIAL (CANCER CENTER ONLY)
Abs Immature Granulocytes: 0.01 10*3/uL (ref 0.00–0.07)
Basophils Absolute: 0 10*3/uL (ref 0.0–0.1)
Basophils Relative: 1 %
Eosinophils Absolute: 0.2 10*3/uL (ref 0.0–0.5)
Eosinophils Relative: 5 %
HCT: 27.1 % — ABNORMAL LOW (ref 36.0–46.0)
Hemoglobin: 8.7 g/dL — ABNORMAL LOW (ref 12.0–15.0)
Immature Granulocytes: 0 %
Lymphocytes Relative: 54 %
Lymphs Abs: 2 10*3/uL (ref 0.7–4.0)
MCH: 32 pg (ref 26.0–34.0)
MCHC: 32.1 g/dL (ref 30.0–36.0)
MCV: 99.6 fL (ref 80.0–100.0)
Monocytes Absolute: 0.5 10*3/uL (ref 0.1–1.0)
Monocytes Relative: 14 %
Neutro Abs: 1 10*3/uL — ABNORMAL LOW (ref 1.7–7.7)
Neutrophils Relative %: 26 %
Platelet Count: 142 10*3/uL — ABNORMAL LOW (ref 150–400)
RBC: 2.72 MIL/uL — ABNORMAL LOW (ref 3.87–5.11)
RDW: 14.3 % (ref 11.5–15.5)
WBC Count: 3.7 10*3/uL — ABNORMAL LOW (ref 4.0–10.5)
nRBC: 0 % (ref 0.0–0.2)

## 2018-03-01 MED ORDER — DARBEPOETIN ALFA 300 MCG/0.6ML IJ SOSY
300.0000 ug | PREFILLED_SYRINGE | Freq: Once | INTRAMUSCULAR | Status: AC
  Administered 2018-03-01: 300 ug via SUBCUTANEOUS

## 2018-03-01 NOTE — Patient Instructions (Signed)

## 2018-03-10 DIAGNOSIS — H40053 Ocular hypertension, bilateral: Secondary | ICD-10-CM | POA: Diagnosis not present

## 2018-03-10 DIAGNOSIS — H26493 Other secondary cataract, bilateral: Secondary | ICD-10-CM | POA: Diagnosis not present

## 2018-03-10 DIAGNOSIS — H04123 Dry eye syndrome of bilateral lacrimal glands: Secondary | ICD-10-CM | POA: Diagnosis not present

## 2018-03-14 ENCOUNTER — Other Ambulatory Visit: Payer: Self-pay | Admitting: Internal Medicine

## 2018-03-22 DIAGNOSIS — M5415 Radiculopathy, thoracolumbar region: Secondary | ICD-10-CM | POA: Diagnosis not present

## 2018-03-22 DIAGNOSIS — I10 Essential (primary) hypertension: Secondary | ICD-10-CM | POA: Diagnosis not present

## 2018-03-22 DIAGNOSIS — Z6835 Body mass index (BMI) 35.0-35.9, adult: Secondary | ICD-10-CM | POA: Diagnosis not present

## 2018-03-30 ENCOUNTER — Ambulatory Visit (INDEPENDENT_AMBULATORY_CARE_PROVIDER_SITE_OTHER): Payer: Medicare Other

## 2018-03-30 DIAGNOSIS — M150 Primary generalized (osteo)arthritis: Secondary | ICD-10-CM | POA: Diagnosis not present

## 2018-03-30 DIAGNOSIS — M25512 Pain in left shoulder: Secondary | ICD-10-CM | POA: Diagnosis not present

## 2018-03-30 DIAGNOSIS — M0589 Other rheumatoid arthritis with rheumatoid factor of multiple sites: Secondary | ICD-10-CM | POA: Diagnosis not present

## 2018-03-30 DIAGNOSIS — M5136 Other intervertebral disc degeneration, lumbar region: Secondary | ICD-10-CM | POA: Diagnosis not present

## 2018-03-30 DIAGNOSIS — E669 Obesity, unspecified: Secondary | ICD-10-CM | POA: Diagnosis not present

## 2018-03-30 DIAGNOSIS — N183 Chronic kidney disease, stage 3 (moderate): Secondary | ICD-10-CM | POA: Diagnosis not present

## 2018-03-30 DIAGNOSIS — Z6836 Body mass index (BMI) 36.0-36.9, adult: Secondary | ICD-10-CM | POA: Diagnosis not present

## 2018-03-30 DIAGNOSIS — D860 Sarcoidosis of lung: Secondary | ICD-10-CM | POA: Diagnosis not present

## 2018-03-30 DIAGNOSIS — Z23 Encounter for immunization: Secondary | ICD-10-CM

## 2018-04-14 ENCOUNTER — Other Ambulatory Visit: Payer: Self-pay | Admitting: Internal Medicine

## 2018-04-15 ENCOUNTER — Other Ambulatory Visit: Payer: Self-pay | Admitting: Internal Medicine

## 2018-05-02 ENCOUNTER — Other Ambulatory Visit: Payer: Self-pay | Admitting: Internal Medicine

## 2018-05-03 ENCOUNTER — Ambulatory Visit: Payer: Self-pay | Admitting: Family

## 2018-05-03 ENCOUNTER — Ambulatory Visit: Payer: Self-pay

## 2018-05-03 ENCOUNTER — Other Ambulatory Visit: Payer: Self-pay

## 2018-05-08 ENCOUNTER — Other Ambulatory Visit: Payer: Self-pay | Admitting: Internal Medicine

## 2018-05-12 DIAGNOSIS — H40053 Ocular hypertension, bilateral: Secondary | ICD-10-CM | POA: Diagnosis not present

## 2018-05-13 ENCOUNTER — Other Ambulatory Visit: Payer: Self-pay | Admitting: Internal Medicine

## 2018-05-15 ENCOUNTER — Other Ambulatory Visit: Payer: Self-pay | Admitting: Internal Medicine

## 2018-05-18 ENCOUNTER — Telehealth: Payer: Self-pay | Admitting: *Deleted

## 2018-05-18 NOTE — Telephone Encounter (Signed)
LVM for patient to call back in regards to scheduling AWV with our health coach.  

## 2018-05-23 ENCOUNTER — Ambulatory Visit: Payer: Self-pay | Admitting: Internal Medicine

## 2018-05-24 ENCOUNTER — Ambulatory Visit: Payer: Medicare Other | Admitting: Adult Health

## 2018-06-01 ENCOUNTER — Inpatient Hospital Stay: Payer: Medicare Other

## 2018-06-01 ENCOUNTER — Encounter: Payer: Self-pay | Admitting: Family

## 2018-06-01 ENCOUNTER — Inpatient Hospital Stay: Payer: Medicare Other | Attending: Family

## 2018-06-01 ENCOUNTER — Inpatient Hospital Stay (HOSPITAL_BASED_OUTPATIENT_CLINIC_OR_DEPARTMENT_OTHER): Payer: Medicare Other | Admitting: Family

## 2018-06-01 VITALS — BP 190/85 | HR 97 | Temp 98.1°F | Resp 18 | Ht 64.0 in | Wt 211.1 lb

## 2018-06-01 DIAGNOSIS — D509 Iron deficiency anemia, unspecified: Secondary | ICD-10-CM | POA: Insufficient documentation

## 2018-06-01 DIAGNOSIS — Z7982 Long term (current) use of aspirin: Secondary | ICD-10-CM | POA: Insufficient documentation

## 2018-06-01 DIAGNOSIS — D500 Iron deficiency anemia secondary to blood loss (chronic): Secondary | ICD-10-CM

## 2018-06-01 DIAGNOSIS — Z79899 Other long term (current) drug therapy: Secondary | ICD-10-CM | POA: Diagnosis not present

## 2018-06-01 DIAGNOSIS — D508 Other iron deficiency anemias: Secondary | ICD-10-CM

## 2018-06-01 DIAGNOSIS — D472 Monoclonal gammopathy: Secondary | ICD-10-CM | POA: Diagnosis not present

## 2018-06-01 DIAGNOSIS — N183 Chronic kidney disease, stage 3 unspecified: Secondary | ICD-10-CM

## 2018-06-01 DIAGNOSIS — D631 Anemia in chronic kidney disease: Secondary | ICD-10-CM | POA: Diagnosis not present

## 2018-06-01 DIAGNOSIS — N182 Chronic kidney disease, stage 2 (mild): Secondary | ICD-10-CM

## 2018-06-01 LAB — CBC WITH DIFFERENTIAL (CANCER CENTER ONLY)
Abs Immature Granulocytes: 0 10*3/uL (ref 0.00–0.07)
Basophils Absolute: 0 10*3/uL (ref 0.0–0.1)
Basophils Relative: 1 %
Eosinophils Absolute: 0.2 10*3/uL (ref 0.0–0.5)
Eosinophils Relative: 6 %
HCT: 27.8 % — ABNORMAL LOW (ref 36.0–46.0)
Hemoglobin: 8.8 g/dL — ABNORMAL LOW (ref 12.0–15.0)
Immature Granulocytes: 0 %
Lymphocytes Relative: 49 %
Lymphs Abs: 1.6 10*3/uL (ref 0.7–4.0)
MCH: 32.7 pg (ref 26.0–34.0)
MCHC: 31.7 g/dL (ref 30.0–36.0)
MCV: 103.3 fL — ABNORMAL HIGH (ref 80.0–100.0)
Monocytes Absolute: 0.4 10*3/uL (ref 0.1–1.0)
Monocytes Relative: 11 %
Neutro Abs: 1.1 10*3/uL — ABNORMAL LOW (ref 1.7–7.7)
Neutrophils Relative %: 33 %
Platelet Count: 178 10*3/uL (ref 150–400)
RBC: 2.69 MIL/uL — ABNORMAL LOW (ref 3.87–5.11)
RDW: 15.3 % (ref 11.5–15.5)
WBC Count: 3.3 10*3/uL — ABNORMAL LOW (ref 4.0–10.5)
nRBC: 0 % (ref 0.0–0.2)

## 2018-06-01 LAB — CMP (CANCER CENTER ONLY)
ALT: 10 U/L (ref 0–44)
AST: 14 U/L — ABNORMAL LOW (ref 15–41)
Albumin: 4.1 g/dL (ref 3.5–5.0)
Alkaline Phosphatase: 53 U/L (ref 38–126)
Anion gap: 7 (ref 5–15)
BUN: 22 mg/dL (ref 8–23)
CO2: 28 mmol/L (ref 22–32)
Calcium: 8.7 mg/dL — ABNORMAL LOW (ref 8.9–10.3)
Chloride: 109 mmol/L (ref 98–111)
Creatinine: 1.87 mg/dL — ABNORMAL HIGH (ref 0.44–1.00)
GFR, Est AFR Am: 31 mL/min — ABNORMAL LOW (ref >60)
GFR, Estimated: 26 mL/min — ABNORMAL LOW (ref >60)
Glucose, Bld: 103 mg/dL — ABNORMAL HIGH (ref 70–99)
Potassium: 3.6 mmol/L (ref 3.5–5.1)
Sodium: 144 mmol/L (ref 135–145)
Total Bilirubin: 0.3 mg/dL (ref 0.3–1.2)
Total Protein: 6.7 g/dL (ref 6.5–8.1)

## 2018-06-01 LAB — RETICULOCYTES
Immature Retic Fract: 18.6 % — ABNORMAL HIGH (ref 2.3–15.9)
RBC.: 2.69 MIL/uL — ABNORMAL LOW (ref 3.87–5.11)
Retic Count, Absolute: 54.1 10*3/uL (ref 19.0–186.0)
Retic Ct Pct: 2 % (ref 0.4–3.1)

## 2018-06-01 NOTE — Progress Notes (Signed)
Hematology and Oncology Follow Up Visit  Tina Molina [id number] April 11, 1946 73 y.o. 06/01/2018   Principle Diagnosis:  Anemia of chronic renal failure - stage II MGUS - IgA lambda Iron deficiency anemia  Current Therapy:   Aranesp 300 mcg SQ to keep Hgb > 11 IV iron as indicated   Interim History:  Tina Molina is here today for follow-up. She is having some mild fatigue and SOB with over exertion. She is also chewing ice.  Her Hgb is down at 8.8 and MCV is up at 103.  She states that she has hemorrhoids and will occasionally note bright red blood on her toilet tissue. No other bleeding noted, no bruising or petechiae.  In June 2019 her M-spike was 0.1, IgA 495 mg/dL and lamda free light chains 2.98 mg/dL.  She states that she forgot to take her blood pressure medication and her BP is 190/85, HG 97.  No fever, chills, n/v, cough, rash, dizziness, chest pain, palpitations, abdominal pain or changes in bowel or bladder habits.  She has swelling in her hands and feet that comes and goes.  Since having back surgery in 2013, she has had numbness and tingling in her toes. This is unchanged.  She denies any falls or syncopal episodes.  No lymphadenopathy noted on exam.  She has maintained a good appetite but admits that she needs to drink more fluids daily.   ECOG Performance Status: 0 - Asymptomatic  Medications:  Allergies as of 06/01/2018      Reactions   Penicillins Other (See Comments)   Intolerance or allergy not remembered; her sister stated penicillin caused "boils under the skin"   Shellfish Allergy Rash   Shellfish-derived Products Anaphylaxis   Sulfonamide Derivatives    Arthralgias; her sister stated that this caused a rash.   Codeine Nausea Only   Nausea only with liquid codeine   Infliximab Other (See Comments)    Confusion (intolerance)   Azithromycin Itching      Medication List       Accurate as of June 01, 2018  1:25 PM. Always use your most recent med  list.        aspirin 81 MG tablet Take 81 mg by mouth daily.   atorvastatin 20 MG tablet Commonly known as:  LIPITOR TAKE 1 TABLET BY MOUTH DAILY   carvedilol 25 MG tablet Commonly known as:  COREG TAKE 1 TABLET BY MOUTH TWICE A DAY   cyclobenzaprine 10 MG tablet Commonly known as:  FLEXERIL Take 10 mg by mouth 3 (three) times daily as needed for muscle spasms.   gabapentin 300 MG capsule Commonly known as:  NEURONTIN TAKE 1 CAPSULE BY MOUTH EVERY 8 HOURS IF NEEDED   hydrALAZINE 50 MG tablet Commonly known as:  APRESOLINE TAKE 1 TABLET BY MOUTH THREE TIMES A DAY   leflunomide 20 MG tablet Commonly known as:  ARAVA Take 20 mg by mouth daily.   levothyroxine 137 MCG tablet Commonly known as:  SYNTHROID, LEVOTHROID TAKE 1 TABLET BY MOUTH ONCE DAILY BEFORE BREAKFAST   multivitamin with minerals Tabs tablet Take 1 tablet by mouth daily.   ranitidine 150 MG tablet Commonly known as:  ZANTAC TAKE 1 TABLET BY MOUTH TWICE A DAY   spironolactone 50 MG tablet Commonly known as:  ALDACTONE TAKE 1/2 TABLET(25 MG) BY MOUTH DAILY   SYMBICORT 160-4.5 MCG/ACT inhaler Generic drug:  budesonide-formoterol inhale 2 puffs if needed for wheezing   telmisartan 80 MG tablet Commonly known as:  MICARDIS TAKE 1 TABLET(80 MG) BY MOUTH DAILY   vitamin E 400 UNIT capsule Take 400 Units by mouth daily.   zolpidem 10 MG tablet Commonly known as:  AMBIEN Take 0.5 tablets (5 mg total) by mouth at bedtime as needed for sleep.       Allergies:  Allergies  Allergen Reactions  . Penicillins Other (See Comments)    Intolerance or allergy not remembered; her sister stated penicillin caused "boils under the skin"  . Shellfish Allergy Rash  . Shellfish-Derived Products Anaphylaxis  . Sulfonamide Derivatives     Arthralgias; her sister stated that this caused a rash.  . Codeine Nausea Only    Nausea only with liquid codeine  . Infliximab Other (See Comments)     Confusion  (intolerance)  . Azithromycin Itching    Past Medical History, Surgical history, Social history, and Family History were reviewed and updated.  Review of Systems: All other 10 point review of systems is negative.   Physical Exam:  height is 5\' 4"  (1.626 m) and weight is 211 lb 1.9 oz (95.8 kg). Her oral temperature is 98.1 F (36.7 C). Her blood pressure is 190/85 (abnormal) and her pulse is 97. Her respiration is 18 and oxygen saturation is 99%.   Wt Readings from Last 3 Encounters:  06/01/18 211 lb 1.9 oz (95.8 kg)  10/28/17 204 lb (92.5 kg)  10/26/17 203 lb (92.1 kg)    Ocular: Sclerae unicteric, pupils equal, round and reactive to light Ear-nose-throat: Oropharynx clear, dentition fair Lymphatic: No cervical, supraclavicular or axillary adenopathy Lungs no rales or rhonchi, good excursion bilaterally Heart regular rate and rhythm, no murmur appreciated Abd soft, nontender, positive bowel sounds, no liver or spleen tip palpated on exam, no fluid wave  MSK no focal spinal tenderness, no joint edema Neuro: non-focal, well-oriented, appropriate affect Breasts: Deferred   Lab Results  Component Value Date   WBC 3.3 (L) 06/01/2018   HGB 8.8 (L) 06/01/2018   HCT 27.8 (L) 06/01/2018   MCV 103.3 (H) 06/01/2018   PLT 178 06/01/2018   Lab Results  Component Value Date   FERRITIN 223 10/26/2017   IRON 98 10/26/2017   TIBC 269 10/26/2017   UIBC 171 10/26/2017   IRONPCTSAT 36 10/26/2017   Lab Results  Component Value Date   RETICCTPCT 2.0 06/01/2018   RBC 2.69 (L) 06/01/2018   RBC 2.69 (L) 06/01/2018   RETICCTABS 51.6 01/03/2013   Lab Results  Component Value Date   KPAFRELGTCHN 18.4 10/26/2017   LAMBDASER 29.8 (H) 10/26/2017   KAPLAMBRATIO 0.62 10/26/2017   Lab Results  Component Value Date   IGGSERUM 908 10/26/2017   IGA 495 (H) 10/26/2017   IGMSERUM 27 10/26/2017   Lab Results  Component Value Date   TOTALPROTELP 6.7 10/26/2017   ALBUMINELP 4.0 10/26/2017     A1GS 0.2 10/26/2017   A2GS 0.6 10/26/2017   BETS 1.2 10/26/2017   BETA2SER 10.3 (H) 11/04/2011   GAMS 0.7 10/26/2017   MSPIKE 0.1 (H) 10/26/2017   SPEI Comment 10/26/2017     Chemistry      Component Value Date/Time   NA 143 10/26/2017 1355   NA 150 (H) 03/01/2017 1308   NA 143 04/05/2016 1156   K 3.7 10/26/2017 1355   K 4.0 03/01/2017 1308   K 3.8 04/05/2016 1156   CL 107 10/26/2017 1355   CL 109 (H) 03/01/2017 1308   CO2 26 10/26/2017 1355   CO2 28 03/01/2017 1308  CO2 21 (L) 04/05/2016 1156   BUN 18 10/26/2017 1355   BUN 19 03/01/2017 1308   BUN 18.5 04/05/2016 1156   CREATININE 1.61 (H) 10/26/2017 1355   CREATININE 1.8 (H) 03/01/2017 1308   CREATININE 1.5 (H) 04/05/2016 1156      Component Value Date/Time   CALCIUM 9.0 10/26/2017 1355   CALCIUM 9.7 03/01/2017 1308   CALCIUM 9.3 04/05/2016 1156   ALKPHOS 61 10/26/2017 1355   ALKPHOS 55 03/01/2017 1308   ALKPHOS 77 04/05/2016 1156   AST 21 10/26/2017 1355   AST 21 04/05/2016 1156   ALT 12 10/26/2017 1355   ALT 18 03/01/2017 1308   ALT 22 04/05/2016 1156   BILITOT 0.3 10/26/2017 1355   BILITOT 0.36 04/05/2016 1156       Impression and Plan: Ms. Balbuena is a very pleasant 73 yo African American female with multifactorial anemia as well as an IgA lambda MGUS.  She is symptomatic at this time with fatigue, SOB with exertion and chewing ice.  Hgb is down at 8.8 but with her BP we will have to hold Aranesp for now. We may be able to give next week once BP comes down.  She was given stool sample supplies and verbalized understanding on how to prepare the cards and will return when complete.  We will see what her iron studies show and brign her back in for infusion if needed.  We will see her back in another 6 weeks.  She will contact our office with any questions or concerns. We can certainly see her sooner if need be.   Laverna Peace, NP 1/16/20201:25 PM

## 2018-06-02 LAB — KAPPA/LAMBDA LIGHT CHAINS
Kappa free light chain: 18.6 mg/L (ref 3.3–19.4)
Kappa, lambda light chain ratio: 0.62 (ref 0.26–1.65)
Lambda free light chains: 30 mg/L — ABNORMAL HIGH (ref 5.7–26.3)

## 2018-06-02 LAB — PROTEIN ELECTROPHORESIS, SERUM
A/G Ratio: 1.8 — ABNORMAL HIGH (ref 0.7–1.7)
Albumin ELP: 4.2 g/dL (ref 2.9–4.4)
Alpha-1-Globulin: 0.2 g/dL (ref 0.0–0.4)
Alpha-2-Globulin: 0.5 g/dL (ref 0.4–1.0)
Beta Globulin: 1.1 g/dL (ref 0.7–1.3)
Gamma Globulin: 0.7 g/dL (ref 0.4–1.8)
Globulin, Total: 2.4 g/dL (ref 2.2–3.9)
M-Spike, %: 0.2 g/dL — ABNORMAL HIGH
Total Protein ELP: 6.6 g/dL (ref 6.0–8.5)

## 2018-06-02 LAB — IGG, IGA, IGM
IgA: 513 mg/dL — ABNORMAL HIGH (ref 64–422)
IgG (Immunoglobin G), Serum: 902 mg/dL (ref 700–1600)
IgM (Immunoglobulin M), Srm: 26 mg/dL (ref 26–217)

## 2018-06-02 LAB — IRON AND TIBC
Iron: 67 ug/dL (ref 41–142)
Saturation Ratios: 24 % (ref 21–57)
TIBC: 279 ug/dL (ref 236–444)
UIBC: 212 ug/dL (ref 120–384)

## 2018-06-02 LAB — FERRITIN: Ferritin: 176 ng/mL (ref 11–307)

## 2018-06-09 DIAGNOSIS — N182 Chronic kidney disease, stage 2 (mild): Secondary | ICD-10-CM | POA: Diagnosis not present

## 2018-06-09 DIAGNOSIS — D509 Iron deficiency anemia, unspecified: Secondary | ICD-10-CM | POA: Diagnosis not present

## 2018-06-09 DIAGNOSIS — Z7982 Long term (current) use of aspirin: Secondary | ICD-10-CM | POA: Diagnosis not present

## 2018-06-09 DIAGNOSIS — D472 Monoclonal gammopathy: Secondary | ICD-10-CM | POA: Diagnosis not present

## 2018-06-09 DIAGNOSIS — D631 Anemia in chronic kidney disease: Secondary | ICD-10-CM | POA: Diagnosis not present

## 2018-06-09 DIAGNOSIS — Z79899 Other long term (current) drug therapy: Secondary | ICD-10-CM | POA: Diagnosis not present

## 2018-06-09 LAB — OCCULT BLOOD X 1 CARD TO LAB, STOOL
Fecal Occult Bld: NEGATIVE
Fecal Occult Bld: NEGATIVE
Fecal Occult Bld: NEGATIVE

## 2018-06-13 ENCOUNTER — Other Ambulatory Visit: Payer: Self-pay | Admitting: Internal Medicine

## 2018-06-25 ENCOUNTER — Other Ambulatory Visit: Payer: Self-pay | Admitting: Internal Medicine

## 2018-06-25 DIAGNOSIS — I10 Essential (primary) hypertension: Secondary | ICD-10-CM

## 2018-06-26 ENCOUNTER — Other Ambulatory Visit: Payer: Self-pay | Admitting: Internal Medicine

## 2018-06-26 DIAGNOSIS — I10 Essential (primary) hypertension: Secondary | ICD-10-CM

## 2018-06-27 DIAGNOSIS — D860 Sarcoidosis of lung: Secondary | ICD-10-CM | POA: Diagnosis not present

## 2018-06-27 DIAGNOSIS — M5136 Other intervertebral disc degeneration, lumbar region: Secondary | ICD-10-CM | POA: Diagnosis not present

## 2018-06-27 DIAGNOSIS — M150 Primary generalized (osteo)arthritis: Secondary | ICD-10-CM | POA: Diagnosis not present

## 2018-06-27 DIAGNOSIS — N183 Chronic kidney disease, stage 3 (moderate): Secondary | ICD-10-CM | POA: Diagnosis not present

## 2018-06-27 DIAGNOSIS — E669 Obesity, unspecified: Secondary | ICD-10-CM | POA: Diagnosis not present

## 2018-06-27 DIAGNOSIS — M0589 Other rheumatoid arthritis with rheumatoid factor of multiple sites: Secondary | ICD-10-CM | POA: Diagnosis not present

## 2018-06-27 DIAGNOSIS — Z6835 Body mass index (BMI) 35.0-35.9, adult: Secondary | ICD-10-CM | POA: Diagnosis not present

## 2018-07-03 ENCOUNTER — Other Ambulatory Visit: Payer: Self-pay | Admitting: Internal Medicine

## 2018-07-05 DIAGNOSIS — D472 Monoclonal gammopathy: Secondary | ICD-10-CM | POA: Diagnosis not present

## 2018-07-05 DIAGNOSIS — E785 Hyperlipidemia, unspecified: Secondary | ICD-10-CM | POA: Diagnosis not present

## 2018-07-05 DIAGNOSIS — D631 Anemia in chronic kidney disease: Secondary | ICD-10-CM | POA: Diagnosis not present

## 2018-07-05 DIAGNOSIS — N183 Chronic kidney disease, stage 3 (moderate): Secondary | ICD-10-CM | POA: Diagnosis not present

## 2018-07-05 DIAGNOSIS — I129 Hypertensive chronic kidney disease with stage 1 through stage 4 chronic kidney disease, or unspecified chronic kidney disease: Secondary | ICD-10-CM | POA: Diagnosis not present

## 2018-07-11 ENCOUNTER — Telehealth: Payer: Self-pay | Admitting: *Deleted

## 2018-07-11 NOTE — Telephone Encounter (Signed)
LVM for patient to call back in regards to scheduling AWV with our health coach.  

## 2018-07-12 ENCOUNTER — Other Ambulatory Visit: Payer: Self-pay

## 2018-07-12 ENCOUNTER — Inpatient Hospital Stay: Payer: Medicare Other

## 2018-07-12 ENCOUNTER — Inpatient Hospital Stay (HOSPITAL_BASED_OUTPATIENT_CLINIC_OR_DEPARTMENT_OTHER): Payer: Medicare Other | Admitting: Family

## 2018-07-12 ENCOUNTER — Inpatient Hospital Stay: Payer: Medicare Other | Attending: Family

## 2018-07-12 VITALS — BP 135/57 | HR 92 | Temp 97.7°F | Resp 20 | Wt 206.5 lb

## 2018-07-12 DIAGNOSIS — D631 Anemia in chronic kidney disease: Secondary | ICD-10-CM

## 2018-07-12 DIAGNOSIS — N183 Chronic kidney disease, stage 3 unspecified: Secondary | ICD-10-CM

## 2018-07-12 DIAGNOSIS — D508 Other iron deficiency anemias: Secondary | ICD-10-CM

## 2018-07-12 DIAGNOSIS — N182 Chronic kidney disease, stage 2 (mild): Secondary | ICD-10-CM | POA: Insufficient documentation

## 2018-07-12 DIAGNOSIS — D472 Monoclonal gammopathy: Secondary | ICD-10-CM | POA: Insufficient documentation

## 2018-07-12 DIAGNOSIS — D500 Iron deficiency anemia secondary to blood loss (chronic): Secondary | ICD-10-CM

## 2018-07-12 DIAGNOSIS — N289 Disorder of kidney and ureter, unspecified: Secondary | ICD-10-CM

## 2018-07-12 LAB — CBC WITH DIFFERENTIAL (CANCER CENTER ONLY)
Abs Immature Granulocytes: 0 10*3/uL (ref 0.00–0.07)
Basophils Absolute: 0.1 10*3/uL (ref 0.0–0.1)
Basophils Relative: 1 %
Eosinophils Absolute: 0.2 10*3/uL (ref 0.0–0.5)
Eosinophils Relative: 5 %
HCT: 28.6 % — ABNORMAL LOW (ref 36.0–46.0)
Hemoglobin: 9.1 g/dL — ABNORMAL LOW (ref 12.0–15.0)
Immature Granulocytes: 0 %
Lymphocytes Relative: 46 %
Lymphs Abs: 1.9 10*3/uL (ref 0.7–4.0)
MCH: 33 pg (ref 26.0–34.0)
MCHC: 31.8 g/dL (ref 30.0–36.0)
MCV: 103.6 fL — ABNORMAL HIGH (ref 80.0–100.0)
Monocytes Absolute: 0.5 10*3/uL (ref 0.1–1.0)
Monocytes Relative: 13 %
Neutro Abs: 1.4 10*3/uL — ABNORMAL LOW (ref 1.7–7.7)
Neutrophils Relative %: 35 %
Platelet Count: 199 10*3/uL (ref 150–400)
RBC: 2.76 MIL/uL — ABNORMAL LOW (ref 3.87–5.11)
RDW: 13.7 % (ref 11.5–15.5)
WBC Count: 4 10*3/uL (ref 4.0–10.5)
nRBC: 0 % (ref 0.0–0.2)

## 2018-07-12 LAB — CMP (CANCER CENTER ONLY)
ALT: 11 U/L (ref 0–44)
AST: 17 U/L (ref 15–41)
Albumin: 4.6 g/dL (ref 3.5–5.0)
Alkaline Phosphatase: 53 U/L (ref 38–126)
Anion gap: 9 (ref 5–15)
BUN: 32 mg/dL — ABNORMAL HIGH (ref 8–23)
CO2: 26 mmol/L (ref 22–32)
Calcium: 9.7 mg/dL (ref 8.9–10.3)
Chloride: 108 mmol/L (ref 98–111)
Creatinine: 2 mg/dL — ABNORMAL HIGH (ref 0.44–1.00)
GFR, Est AFR Am: 28 mL/min — ABNORMAL LOW (ref >60)
GFR, Estimated: 24 mL/min — ABNORMAL LOW (ref >60)
Glucose, Bld: 120 mg/dL — ABNORMAL HIGH (ref 70–99)
Potassium: 3.9 mmol/L (ref 3.5–5.1)
Sodium: 143 mmol/L (ref 135–145)
Total Bilirubin: 0.3 mg/dL (ref 0.3–1.2)
Total Protein: 7.6 g/dL (ref 6.5–8.1)

## 2018-07-12 LAB — RETICULOCYTES
Immature Retic Fract: 15.1 % (ref 2.3–15.9)
RBC.: 2.76 MIL/uL — ABNORMAL LOW (ref 3.87–5.11)
Retic Count, Absolute: 57.1 10*3/uL (ref 19.0–186.0)
Retic Ct Pct: 2.1 % (ref 0.4–3.1)

## 2018-07-12 MED ORDER — DARBEPOETIN ALFA 300 MCG/0.6ML IJ SOSY
300.0000 ug | PREFILLED_SYRINGE | Freq: Once | INTRAMUSCULAR | Status: AC
  Administered 2018-07-12: 300 ug via SUBCUTANEOUS

## 2018-07-12 MED ORDER — DARBEPOETIN ALFA 300 MCG/0.6ML IJ SOSY
PREFILLED_SYRINGE | INTRAMUSCULAR | Status: AC
  Filled 2018-07-12: qty 0.6

## 2018-07-12 NOTE — Progress Notes (Signed)
Hematology and Oncology Follow Up Visit  Tina Molina [id number] [birth year] 73 y.o. 07/12/2018   Principle Diagnosis:  Anemia of chronic renal failure - stage II MGUS - IgA lambda Iron deficiency anemia  Current Therapy:   Aranesp 300 mcg SQ to keep Hgb > 11 IV iron as indicated   Interim History:  Tina Molina is here today for follow-up. She is still having some fatigue. Hgb today is 9.1, MCV 103.  No episodes of bleeding. No bruising or petechiae.  She has some SOB with over exertion and will take a break to rest when needed.  M-spike in January was 0.2, IgA 513 mg/dL and lamda light chains 3.00 mg/dL. No fever, chills, n/v, cough, rash, dizziness, chest pain, palpitations, abdominal pain or changes in bowel or bladder habits.  Her PCP started her on lasix which she feels has significantly reduced her fluid retention.  No swelling or tenderness in her extremities at this time.  She has numbness and tingling in the toes since having back surgery several years ago.  No falls or syncopal episodes to report.  No lymphadenopathy noted on exam.  She has maintained a good appetite and is staying well hydrated. Her weight is stable.   ECOG Performance Status: 1 - Symptomatic but completely ambulatory  Medications:  Allergies as of 07/12/2018      Reactions   Penicillins Other (See Comments)   Intolerance or allergy not remembered; her sister stated penicillin caused "boils under the skin"   Shellfish Allergy Rash   Shellfish-derived Products Anaphylaxis   Sulfonamide Derivatives    Arthralgias; her sister stated that this caused a rash.   Codeine Nausea Only   Nausea only with liquid codeine   Infliximab Other (See Comments)    Confusion (intolerance)   Azithromycin Itching      Medication List       Accurate as of July 12, 2018 12:54 PM. Always use your most recent med list.        aspirin 81 MG tablet Take 81 mg by mouth daily.   atorvastatin 20 MG  tablet Commonly known as:  LIPITOR TAKE 1 TABLET BY MOUTH DAILY   carvedilol 25 MG tablet Commonly known as:  COREG TAKE 1 TABLET BY MOUTH TWICE A DAY   cyclobenzaprine 10 MG tablet Commonly known as:  FLEXERIL Take 10 mg by mouth 3 (three) times daily as needed for muscle spasms.   gabapentin 300 MG capsule Commonly known as:  NEURONTIN TAKE 1 CAPSULE BY MOUTH EVERY 8 HOURS IF NEEDED   hydrALAZINE 50 MG tablet Commonly known as:  APRESOLINE TAKE 1 TABLET(50 MG) BY MOUTH THREE TIMES DAILY NEED OFFICE VISIT FOR MORE REFILLS   leflunomide 20 MG tablet Commonly known as:  ARAVA Take 20 mg by mouth daily.   levothyroxine 137 MCG tablet Commonly known as:  SYNTHROID, LEVOTHROID TAKE 1 TABLET BY MOUTH ONCE DAILY BEFORE BREAKFAST   multivitamin with minerals Tabs tablet Take 1 tablet by mouth daily.   ranitidine 150 MG tablet Commonly known as:  ZANTAC TAKE 1 TABLET(150 MG) BY MOUTH TWICE DAILY NEEDS OFFICE VISIT FOR MORE REFILLS   spironolactone 50 MG tablet Commonly known as:  ALDACTONE TAKE 1/2 TABLET(25 MG) BY MOUTH DAILY   SYMBICORT 160-4.5 MCG/ACT inhaler Generic drug:  budesonide-formoterol inhale 2 puffs if needed for wheezing   telmisartan 80 MG tablet Commonly known as:  MICARDIS TAKE 1 TABLET(80 MG) BY MOUTH DAILY   vitamin E 400 UNIT capsule Take  400 Units by mouth daily.   zolpidem 10 MG tablet Commonly known as:  AMBIEN Take 0.5 tablets (5 mg total) by mouth at bedtime as needed for sleep.       Allergies:  Allergies  Allergen Reactions  . Penicillins Other (See Comments)    Intolerance or allergy not remembered; her sister stated penicillin caused "boils under the skin"  . Shellfish Allergy Rash  . Shellfish-Derived Products Anaphylaxis  . Sulfonamide Derivatives     Arthralgias; her sister stated that this caused a rash.  . Codeine Nausea Only    Nausea only with liquid codeine  . Infliximab Other (See Comments)     Confusion (intolerance)   . Azithromycin Itching    Past Medical History, Surgical history, Social history, and Family History were reviewed and updated.  Review of Systems: All other 10 point review of systems is negative.   Physical Exam:  vitals were not taken for this visit.   Wt Readings from Last 3 Encounters:  06/01/18 211 lb 1.9 oz (95.8 kg)  10/28/17 204 lb (92.5 kg)  10/26/17 203 lb (92.1 kg)    Ocular: Sclerae unicteric, pupils equal, round and reactive to light Ear-nose-throat: Oropharynx clear, dentition fair Lymphatic: No cervical, supraclavicular or axillary adenopathy Lungs no rales or rhonchi, good excursion bilaterally Heart regular rate and rhythm, no murmur appreciated Abd soft, nontender, positive bowel sounds, no liver or spleen tip palpated on exam, no fluid wave  MSK no focal spinal tenderness, no joint edema Neuro: non-focal, well-oriented, appropriate affect Breasts: Deferred   Lab Results  Component Value Date   WBC 3.3 (L) 06/01/2018   HGB 8.8 (L) 06/01/2018   HCT 27.8 (L) 06/01/2018   MCV 103.3 (H) 06/01/2018   PLT 178 06/01/2018   Lab Results  Component Value Date   FERRITIN 176 06/01/2018   IRON 67 06/01/2018   TIBC 279 06/01/2018   UIBC 212 06/01/2018   IRONPCTSAT 24 06/01/2018   Lab Results  Component Value Date   RETICCTPCT 2.0 06/01/2018   RBC 2.69 (L) 06/01/2018   RBC 2.69 (L) 06/01/2018   RETICCTABS 51.6 01/03/2013   Lab Results  Component Value Date   KPAFRELGTCHN 18.6 06/01/2018   LAMBDASER 30.0 (H) 06/01/2018   KAPLAMBRATIO 0.62 06/01/2018   Lab Results  Component Value Date   IGGSERUM 902 06/01/2018   IGA 513 (H) 06/01/2018   IGMSERUM 26 06/01/2018   Lab Results  Component Value Date   TOTALPROTELP 6.6 06/01/2018   ALBUMINELP 4.2 06/01/2018   A1GS 0.2 06/01/2018   A2GS 0.5 06/01/2018   BETS 1.1 06/01/2018   BETA2SER 10.3 (H) 11/04/2011   GAMS 0.7 06/01/2018   MSPIKE 0.2 (H) 06/01/2018   SPEI Comment 06/01/2018     Chemistry       Component Value Date/Time   NA 144 06/01/2018 1259   NA 150 (H) 03/01/2017 1308   NA 143 04/05/2016 1156   K 3.6 06/01/2018 1259   K 4.0 03/01/2017 1308   K 3.8 04/05/2016 1156   CL 109 06/01/2018 1259   CL 109 (H) 03/01/2017 1308   CO2 28 06/01/2018 1259   CO2 28 03/01/2017 1308   CO2 21 (L) 04/05/2016 1156   BUN 22 06/01/2018 1259   BUN 19 03/01/2017 1308   BUN 18.5 04/05/2016 1156   CREATININE 1.87 (H) 06/01/2018 1259   CREATININE 1.8 (H) 03/01/2017 1308   CREATININE 1.5 (H) 04/05/2016 1156      Component Value Date/Time  CALCIUM 8.7 (L) 06/01/2018 1259   CALCIUM 9.7 03/01/2017 1308   CALCIUM 9.3 04/05/2016 1156   ALKPHOS 53 06/01/2018 1259   ALKPHOS 55 03/01/2017 1308   ALKPHOS 77 04/05/2016 1156   AST 14 (L) 06/01/2018 1259   AST 21 04/05/2016 1156   ALT 10 06/01/2018 1259   ALT 18 03/01/2017 1308   ALT 22 04/05/2016 1156   BILITOT 0.3 06/01/2018 1259   BILITOT 0.36 04/05/2016 1156       Impression and Plan: Ms. Burrowes is a very pleasant 73 yo African American female with multifactorial anemia as well as an IgA lambda MGUS.   She is doing well but still has some fatigue.  She received Aranesp today for Hgb of 9.1.  Results for protein studies are pending.  We will plan to see her back in another 6 weeks for follow-up.  She will contact our office with any questions or concerns. We can certainly see her sooner if need be.   Laverna Peace, NP 2/26/202012:54 PM

## 2018-07-13 ENCOUNTER — Telehealth: Payer: Self-pay | Admitting: *Deleted

## 2018-07-13 LAB — IRON AND TIBC
Iron: 70 ug/dL (ref 41–142)
Saturation Ratios: 24 % (ref 21–57)
TIBC: 289 ug/dL (ref 236–444)
UIBC: 219 ug/dL (ref 120–384)

## 2018-07-13 LAB — FERRITIN: Ferritin: 205 ng/mL (ref 11–307)

## 2018-07-13 NOTE — Telephone Encounter (Signed)
LVM for patient to inform that nurse scheduled AWV on 07/17/18 following PCP visit.

## 2018-07-14 ENCOUNTER — Other Ambulatory Visit: Payer: Self-pay | Admitting: Internal Medicine

## 2018-07-16 NOTE — Patient Instructions (Addendum)
  Tests ordered today. Your results will be released to [facility] (or called to you) after review, usually within 72hours after test completion. If any changes need to be made, you will be notified at that same time.  Medications reviewed and updated.  Changes include :   none  Your prescription(s) have been submitted to your pharmacy. Please take as directed and contact our office if you believe you are having problem(s) with the medication(s).  A mammogram was ordered.   Please followup in 6 months

## 2018-07-16 NOTE — Progress Notes (Signed)
Subjective:    Patient ID: Tina Molina, female    DOB: Oct 31, 1945, 73 y.o.   MRN: [id number]  HPI The patient is here for follow up.  Left breast pain:  Her left breast has ached intermittently for a while.  She thinks it starts when a cramp comes around from her back.  She has always had lumps in her breast and they are still there. .    Hypertension: She is taking her medication daily. She is compliant with a low sodium diet.  She denies chest pain, palpitations, edema, shortness of breath and regular headaches. She is not exercising regularly.  She does not monitor her blood pressure at home.    Hypothyroidism:  She is taking her medication daily.  She denies any recent changes in energy or weight that are unexplained.   Hyperlipidemia: She is taking her medication daily. She is compliant with a low fat/cholesterol diet. She is not exercising regularly. She denies myalgias.   GERD:  She is taking her medication daily as prescribed.  She denies any GERD symptoms and feels her GERD is well controlled.   Chronic kidney disease:  She follows with nephrology.  She was placed on lasix and that has helped her leg swelling.    Hyperglycemia:  She is compliant with a low sugar/carbohydrate diet.  She is not exercising regularly.   Medications and allergies reviewed with patient and updated if appropriate.  Patient Active Problem List   Diagnosis Date Noted  . Nocturia more than twice per night 10/25/2017  . Insomnia secondary to chronic pain 10/25/2017  . Upper back pain on left side 04/20/2017  . IDA (iron deficiency anemia) 04/06/2016  . Erythropoietin deficiency anemia 02/23/2016  . GERD (gastroesophageal reflux disease) 06/25/2015  . Hyperlipidemia 01/13/2015  . Hyperglycemia 01/13/2015  . Rheumatoid factor positive 02/20/2014  . Thoracic radiculopathy 10/30/2013  . Circadian rhythm sleep disorder, shift work type 11/03/2012  . Hematuria 08/05/2012  . MGUS (monoclonal  gammopathy of unknown significance) 08/03/2012  . Lumbar stenosis with neurogenic claudication 02/29/2012  . Edema 09/13/2011  . CKD (chronic kidney disease) stage 3, GFR 30-59 ml/min (HCC) 12/19/2009  . HERNIATED LUMBAR DISC 12/19/2009  . PULMONARY SARCOIDOSIS 10/16/2009  . Hypothyroidism 10/16/2009  . Anemia associated with stage 2 chronic renal failure 10/16/2009  . CARPAL TUNNEL SYNDROME, BILATERAL 10/16/2009  . UVEITIS 10/16/2009  . Essential hypertension 10/16/2009  . ARTHRITIS 10/16/2009  . CERVICAL DISC DISORDER 10/16/2009    Current Outpatient Medications on File Prior to Visit  Medication Sig Dispense Refill  . aspirin 81 MG tablet Take 81 mg by mouth daily.     Marland Kitchen atorvastatin (LIPITOR) 20 MG tablet TAKE 1 TABLET BY MOUTH DAILY 90 tablet 0  . carvedilol (COREG) 25 MG tablet TAKE 1 TABLET BY MOUTH TWICE A DAY 180 tablet 1  . cyclobenzaprine (FLEXERIL) 10 MG tablet Take 10 mg by mouth 3 (three) times daily as needed for muscle spasms.    . furosemide (LASIX) 20 MG tablet Take 20 mg by mouth daily.    Marland Kitchen gabapentin (NEURONTIN) 300 MG capsule TAKE 1 CAPSULE BY MOUTH EVERY 8 HOURS IF NEEDED 90 capsule 0  . hydrALAZINE (APRESOLINE) 50 MG tablet TAKE 1 TABLET(50 MG) BY MOUTH THREE TIMES DAILY NEED OFFICE VISIT FOR MORE REFILLS 270 tablet 0  . leflunomide (ARAVA) 20 MG tablet Take 20 mg by mouth daily.    Marland Kitchen levothyroxine (SYNTHROID, LEVOTHROID) 137 MCG tablet TAKE 1 TABLET BY MOUTH  ONCE DAILY BEFORE BREAKFAST 90 tablet 0  . LUMIGAN 0.01 % SOLN INSTILL 1 DROP INTO BOTH EYES ONCE DAILY AT BEDTIME  0  . Multiple Vitamin (MULTIVITAMIN WITH MINERALS) TABS Take 1 tablet by mouth daily.    Marland Kitchen spironolactone (ALDACTONE) 50 MG tablet TAKE 1/2 TABLET(25 MG) BY MOUTH DAILY 45 tablet 0  . SYMBICORT 160-4.5 MCG/ACT inhaler inhale 2 puffs if needed for wheezing 10.2 g 0  . telmisartan (MICARDIS) 80 MG tablet TAKE 1 TABLET(80 MG) BY MOUTH DAILY 90 tablet 1  . vitamin E 400 UNIT capsule Take 400 Units  by mouth daily.     Marland Kitchen zolpidem (AMBIEN) 10 MG tablet Take 0.5 tablets (5 mg total) by mouth at bedtime as needed for sleep. 45 tablet 3  . [DISCONTINUED] modafinil (PROVIGIL) 100 MG tablet Take 100 mg by mouth daily.    . [DISCONTINUED] potassium chloride (MICRO-K) 10 MEQ CR capsule Take 1 capsule (10 mEq total) by mouth 2 (two) times daily. 180 capsule 1   No current facility-administered medications on file prior to visit.     Past Medical History:  Diagnosis Date  . Anemia    Associated with leukopenia and lymphocytosis. This is been evaluated by Dr. Marin Olp . Her platelet count has been normal  . Arthritis   . Chronic back pain   . Chronic kidney disease   . Erythropoietin deficiency anemia 02/23/2016  . GERD (gastroesophageal reflux disease)   . Hypertension   . Lymphocytosis   . MGUS (monoclonal gammopathy of unknown significance) 08/03/2012   IgA 792 11/04/11  . Pneumonia 04/2010   OP  . Renal insufficiency   . Sarcoidosis   . Thyroid disease     Past Surgical History:  Procedure Laterality Date  . CARPAL TUNNEL RELEASE     Bilateral   . CATARACT EXTRACTION Right 05/2015  . CERVICAL DISCECTOMY  06/2010   Dr.Pool  . CHOLECYSTECTOMY    . COLONOSCOPY  2010  . POSTERIOR LUMBAR FUSION  02/29/2012   Dr Alyson Locket op respiratory & renal compromise  . THYROIDECTOMY  2003   For goiter    Social History   Socioeconomic History  . Marital status: Divorced    Spouse name: Not on file  . Number of children: 0  . Years of education: 3  . Highest education level: Not on file  Occupational History  . Occupation: Retired    Fish farm manager: RETIRED  Social Needs  . Financial resource strain: Not on file  . Food insecurity:    Worry: Not on file    Inability: Not on file  . Transportation needs:    Medical: Not on file    Non-medical: Not on file  Tobacco Use  . Smoking status: Former Smoker    Packs/day: 0.50    Years: 20.00    Pack years: 10.00    Types: Cigarettes     Last attempt to quit: 05/17/1997    Years since quitting: 21.1  . Smokeless tobacco: Never Used  Substance and Sexual Activity  . Alcohol use: Yes    Alcohol/week: 1.0 standard drinks    Types: 1 Glasses of wine per week    Comment: occasionally   . Drug use: No  . Sexual activity: Not on file  Lifestyle  . Physical activity:    Days per week: Not on file    Minutes per session: Not on file  . Stress: Not on file  Relationships  . Social connections:    Talks  on phone: Not on file    Gets together: Not on file    Attends religious service: Not on file    Active member of club or organization: Not on file    Attends meetings of clubs or organizations: Not on file    Relationship status: Not on file  Other Topics Concern  . Not on file  Social History Narrative   Patient is divorced and lives with her sister. Patient is retired.   Caffeine- sometimes - one cup   Right handed.    Family History  Problem Relation Age of Onset  . Anemia Father   . Arthritis Father   . Heart failure Father 94       No CAD  . Diabetes Mother   . Arthritis Mother   . Glaucoma Mother   . Heart disease Mother 61       No CAD  . Hypertension Brother   . Anemia Brother   . Kidney disease Brother   . Hypertension Sister   . Kidney disease Sister   . Anemia Sister   . Diabetes Maternal Aunt   . Arthritis Paternal Grandmother     Review of Systems  Constitutional: Negative for chills and fever.  Respiratory: Positive for shortness of breath (with exertion). Negative for cough and wheezing.   Cardiovascular: Positive for chest pain (just the left breast - no other chest pain) and leg swelling (intermittent). Negative for palpitations.  Neurological: Negative for light-headedness and headaches.       Objective:   Vitals:   07/17/18 1311  BP: 114/60  Pulse: 91  Resp: 16  Temp: 98.2 F (36.8 C)  SpO2: 96%   BP Readings from Last 3 Encounters:  07/17/18 114/60  07/12/18 (!) 135/57    06/01/18 (!) 190/85   Wt Readings from Last 3 Encounters:  07/17/18 207 lb 12.8 oz (94.3 kg)  07/12/18 206 lb 8 oz (93.7 kg)  06/01/18 211 lb 1.9 oz (95.8 kg)   Body mass index is 35.67 kg/m.   Physical Exam    Constitutional: Appears well-developed and well-nourished. No distress.  HENT:  Head: Normocephalic and atraumatic.  Neck: Neck supple. No tracheal deviation present. No thyromegaly present.  No cervical lymphadenopathy Cardiovascular: Normal rate, regular rhythm and normal heart sounds.   No murmur heard. No carotid bruit .  No edema Pulmonary/Chest: Effort normal and breath sounds normal. No respiratory distress. No has no wheezes. No rales.  Skin: Skin is warm and dry. Not diaphoretic.  Psychiatric: Normal mood and affect. Behavior is normal.      Assessment & Plan:    See Problem List for Assessment and Plan of chronic medical problems.

## 2018-07-17 ENCOUNTER — Other Ambulatory Visit: Payer: Self-pay | Admitting: Internal Medicine

## 2018-07-17 ENCOUNTER — Other Ambulatory Visit (INDEPENDENT_AMBULATORY_CARE_PROVIDER_SITE_OTHER): Payer: Medicare Other

## 2018-07-17 ENCOUNTER — Encounter: Payer: Self-pay | Admitting: Internal Medicine

## 2018-07-17 ENCOUNTER — Ambulatory Visit (INDEPENDENT_AMBULATORY_CARE_PROVIDER_SITE_OTHER): Payer: Medicare Other | Admitting: Internal Medicine

## 2018-07-17 ENCOUNTER — Ambulatory Visit (INDEPENDENT_AMBULATORY_CARE_PROVIDER_SITE_OTHER): Payer: Medicare Other | Admitting: *Deleted

## 2018-07-17 VITALS — BP 114/60 | HR 91 | Resp 16 | Ht 64.0 in | Wt 207.0 lb

## 2018-07-17 VITALS — BP 114/60 | HR 91 | Temp 98.2°F | Resp 16 | Ht 64.0 in | Wt 207.8 lb

## 2018-07-17 DIAGNOSIS — N644 Mastodynia: Secondary | ICD-10-CM

## 2018-07-17 DIAGNOSIS — R739 Hyperglycemia, unspecified: Secondary | ICD-10-CM | POA: Diagnosis not present

## 2018-07-17 DIAGNOSIS — Z1211 Encounter for screening for malignant neoplasm of colon: Secondary | ICD-10-CM

## 2018-07-17 DIAGNOSIS — Z780 Asymptomatic menopausal state: Secondary | ICD-10-CM

## 2018-07-17 DIAGNOSIS — I10 Essential (primary) hypertension: Secondary | ICD-10-CM

## 2018-07-17 DIAGNOSIS — N183 Chronic kidney disease, stage 3 unspecified: Secondary | ICD-10-CM

## 2018-07-17 DIAGNOSIS — K219 Gastro-esophageal reflux disease without esophagitis: Secondary | ICD-10-CM | POA: Diagnosis not present

## 2018-07-17 DIAGNOSIS — E890 Postprocedural hypothyroidism: Secondary | ICD-10-CM

## 2018-07-17 DIAGNOSIS — Z0000 Encounter for general adult medical examination without abnormal findings: Secondary | ICD-10-CM | POA: Diagnosis not present

## 2018-07-17 DIAGNOSIS — E7849 Other hyperlipidemia: Secondary | ICD-10-CM

## 2018-07-17 LAB — COMPREHENSIVE METABOLIC PANEL
ALT: 10 U/L (ref 0–35)
AST: 15 U/L (ref 0–37)
Albumin: 4.5 g/dL (ref 3.5–5.2)
Alkaline Phosphatase: 51 U/L (ref 39–117)
BUN: 27 mg/dL — ABNORMAL HIGH (ref 6–23)
CO2: 28 mEq/L (ref 19–32)
Calcium: 9.4 mg/dL (ref 8.4–10.5)
Chloride: 105 mEq/L (ref 96–112)
Creatinine, Ser: 2.08 mg/dL — ABNORMAL HIGH (ref 0.40–1.20)
GFR: 28.3 mL/min — ABNORMAL LOW (ref >60.00)
Glucose, Bld: 96 mg/dL (ref 70–99)
Potassium: 4.3 mEq/L (ref 3.5–5.1)
Sodium: 141 mEq/L (ref 135–145)
Total Bilirubin: 0.3 mg/dL (ref 0.2–1.2)
Total Protein: 7.5 g/dL (ref 6.0–8.3)

## 2018-07-17 LAB — LIPID PANEL
Cholesterol: 187 mg/dL (ref 0–200)
HDL: 72.9 mg/dL (ref >39.00)
LDL Cholesterol: 93 mg/dL (ref 0–99)
NonHDL: 114.2
Total CHOL/HDL Ratio: 3
Triglycerides: 105 mg/dL (ref 0.0–149.0)
VLDL: 21 mg/dL (ref 0.0–40.0)

## 2018-07-17 LAB — HEMOGLOBIN A1C: Hgb A1c MFr Bld: 4.7 % (ref 4.6–6.5)

## 2018-07-17 LAB — TSH: TSH: 17.21 u[IU]/mL — ABNORMAL HIGH (ref 0.35–4.50)

## 2018-07-17 MED ORDER — LEVOTHYROXINE SODIUM 137 MCG PO TABS: ORAL_TABLET | ORAL | 0 refills | Status: DC

## 2018-07-17 MED ORDER — RANITIDINE HCL 150 MG PO TABS: ORAL_TABLET | ORAL | 0 refills | Status: DC

## 2018-07-17 NOTE — Assessment & Plan Note (Signed)
Has had mildly elevated sugar changes in the past A1c

## 2018-07-17 NOTE — Assessment & Plan Note (Signed)
BP well controlled Current regimen effective and well tolerated Continue current medications at current doses cmp  

## 2018-07-17 NOTE — Assessment & Plan Note (Signed)
Following with nephrology Taking lasix for LE edema cmp

## 2018-07-17 NOTE — Patient Instructions (Addendum)
Continue doing brain stimulating activities (puzzles, reading, adult coloring books, staying active) to keep memory sharp.   Continue to eat heart healthy diet (full of fruits, vegetables, whole grains, lean protein, water--limit salt, fat, and sugar intake) and increase physical activity as tolerated.   Tina Molina , Thank you for taking time to come for your Medicare Wellness Visit. I appreciate your ongoing commitment to your health goals. Please review the following plan we discussed and let me know if I can assist you in the future.   These are the goals we discussed: Goals    . patient     Try to eat less fried foods;      Marland Kitchen Patient Stated     Increase my physical activity. I can start to do the elliptical 3 times weekly for 5 minutes.    . Patient Stated     Increase physical activity by riding elipical.        This is a list of the screening recommended for you and due dates:  Health Maintenance  Topic Date Due  . DEXA scan (bone density measurement)  05/29/2013  . Colon Cancer Screening  10/03/2017  . Mammogram  04/30/2018  . Tetanus Vaccine  06/02/2019  . Flu Shot  Completed  .  Hepatitis C: One time screening is recommended by Center for Disease Control  (CDC) for  adults born from [birth year] through 1965.   Completed  . Pneumonia vaccines  Completed   Health Maintenance, Female Adopting a healthy lifestyle and getting preventive care can go a long way to promote health and wellness. Talk with your health care provider about what schedule of regular examinations is right for you. This is a good chance for you to check in with your provider about disease prevention and staying healthy. In between checkups, there are plenty of things you can do on your own. Experts have done a lot of research about which lifestyle changes and preventive measures are most likely to keep you healthy. Ask your health care provider for more information. Weight and diet Eat a healthy diet  Be sure  to include plenty of vegetables, fruits, low-fat dairy products, and lean protein.  Do not eat a lot of foods high in solid fats, added sugars, or salt.  Get regular exercise. This is one of the most important things you can do for your health. ? Most adults should exercise for at least 150 minutes each week. The exercise should increase your heart rate and make you sweat (moderate-intensity exercise). ? Most adults should also do strengthening exercises at least twice a week. This is in addition to the moderate-intensity exercise. Maintain a healthy weight  Body mass index (BMI) is a measurement that can be used to identify possible weight problems. It estimates body fat based on height and weight. Your health care provider can help determine your BMI and help you achieve or maintain a healthy weight.  For females 80 years of age and older: ? A BMI below 18.5 is considered underweight. ? A BMI of 18.5 to 24.9 is normal. ? A BMI of 25 to 29.9 is considered overweight. ? A BMI of 30 and above is considered obese. Watch levels of cholesterol and blood lipids  You should start having your blood tested for lipids and cholesterol at 73 years of age, then have this test every 5 years.  You may need to have your cholesterol levels checked more often if: ? Your lipid or cholesterol levels  are high. ? You are older than 73 years of age. ? You are at high risk for heart disease. Cancer screening Lung Cancer  Lung cancer screening is recommended for adults 17-60 years old who are at high risk for lung cancer because of a history of smoking.  A yearly low-dose CT scan of the lungs is recommended for people who: ? Currently smoke. ? Have quit within the past 15 years. ? Have at least a 30-pack-year history of smoking. A pack year is smoking an average of one pack of cigarettes a day for 1 year.  Yearly screening should continue until it has been 15 years since you quit.  Yearly screening  should stop if you develop a health problem that would prevent you from having lung cancer treatment. Breast Cancer  Practice breast self-awareness. This means understanding how your breasts normally appear and feel.  It also means doing regular breast self-exams. Let your health care provider know about any changes, no matter how small.  If you are in your 20s or 30s, you should have a clinical breast exam (CBE) by a health care provider every 1-3 years as part of a regular health exam.  If you are 42 or older, have a CBE every year. Also consider having a breast X-ray (mammogram) every year.  If you have a family history of breast cancer, talk to your health care provider about genetic screening.  If you are at high risk for breast cancer, talk to your health care provider about having an MRI and a mammogram every year.  Breast cancer gene (BRCA) assessment is recommended for women who have family members with BRCA-related cancers. BRCA-related cancers include: ? Breast. ? Ovarian. ? Tubal. ? Peritoneal cancers.  Results of the assessment will determine the need for genetic counseling and BRCA1 and BRCA2 testing. Cervical Cancer Your health care provider may recommend that you be screened regularly for cancer of the pelvic organs (ovaries, uterus, and vagina). This screening involves a pelvic examination, including checking for microscopic changes to the surface of your cervix (Pap test). You may be encouraged to have this screening done every 3 years, beginning at age 38.  For women ages 72-65, health care providers may recommend pelvic exams and Pap testing every 3 years, or they may recommend the Pap and pelvic exam, combined with testing for human papilloma virus (HPV), every 5 years. Some types of HPV increase your risk of cervical cancer. Testing for HPV may also be done on women of any age with unclear Pap test results.  Other health care providers may not recommend any screening  for nonpregnant women who are considered low risk for pelvic cancer and who do not have symptoms. Ask your health care provider if a screening pelvic exam is right for you.  If you have had past treatment for cervical cancer or a condition that could lead to cancer, you need Pap tests and screening for cancer for at least 20 years after your treatment. If Pap tests have been discontinued, your risk factors (such as having a new sexual partner) need to be reassessed to determine if screening should resume. Some women have medical problems that increase the chance of getting cervical cancer. In these cases, your health care provider may recommend more frequent screening and Pap tests. Colorectal Cancer  This type of cancer can be detected and often prevented.  Routine colorectal cancer screening usually begins at 73 years of age and continues through 73 years of age.  Your health care provider may recommend screening at an earlier age if you have risk factors for colon cancer.  Your health care provider may also recommend using home test kits to check for hidden blood in the stool.  A small camera at the end of a tube can be used to examine your colon directly (sigmoidoscopy or colonoscopy). This is done to check for the earliest forms of colorectal cancer.  Routine screening usually begins at age 27.  Direct examination of the colon should be repeated every 5-10 years through 73 years of age. However, you may need to be screened more often if early forms of precancerous polyps or small growths are found. Skin Cancer  Check your skin from head to toe regularly.  Tell your health care provider about any new moles or changes in moles, especially if there is a change in a mole's shape or color.  Also tell your health care provider if you have a mole that is larger than the size of a pencil eraser.  Always use sunscreen. Apply sunscreen liberally and repeatedly throughout the day.  Protect  yourself by wearing long sleeves, pants, a wide-brimmed hat, and sunglasses whenever you are outside. Heart disease, diabetes, and high blood pressure  High blood pressure causes heart disease and increases the risk of stroke. High blood pressure is more likely to develop in: ? People who have blood pressure in the high end of the normal range (130-139/85-89 mm Hg). ? People who are overweight or obese. ? People who are African American.  If you are 10-73 years of age, have your blood pressure checked every 3-5 years. If you are 5 years of age or older, have your blood pressure checked every year. You should have your blood pressure measured twice-once when you are at a hospital or clinic, and once when you are not at a hospital or clinic. Record the average of the two measurements. To check your blood pressure when you are not at a hospital or clinic, you can use: ? An automated blood pressure machine at a pharmacy. ? A home blood pressure monitor.  If you are between 41 years and 59 years old, ask your health care provider if you should take aspirin to prevent strokes.  Have regular diabetes screenings. This involves taking a blood sample to check your fasting blood sugar level. ? If you are at a normal weight and have a low risk for diabetes, have this test once every three years after 73 years of age. ? If you are overweight and have a high risk for diabetes, consider being tested at a younger age or more often. Preventing infection Hepatitis B  If you have a higher risk for hepatitis B, you should be screened for this virus. You are considered at high risk for hepatitis B if: ? You were born in a country where hepatitis B is common. Ask your health care provider which countries are considered high risk. ? Your parents were born in a high-risk country, and you have not been immunized against hepatitis B (hepatitis B vaccine). ? You have HIV or AIDS. ? You use needles to inject street  drugs. ? You live with someone who has hepatitis B. ? You have had sex with someone who has hepatitis B. ? You get hemodialysis treatment. ? You take certain medicines for conditions, including cancer, organ transplantation, and autoimmune conditions. Hepatitis C  Blood testing is recommended for: ? Everyone born from [birth year] through 1965. ? Anyone  with known risk factors for hepatitis C. Sexually transmitted infections (STIs)  You should be screened for sexually transmitted infections (STIs) including gonorrhea and chlamydia if: ? You are sexually active and are younger than 74 years of age. ? You are older than 73 years of age and your health care provider tells you that you are at risk for this type of infection. ? Your sexual activity has changed since you were last screened and you are at an increased risk for chlamydia or gonorrhea. Ask your health care provider if you are at risk.  If you do not have HIV, but are at risk, it may be recommended that you take a prescription medicine daily to prevent HIV infection. This is called pre-exposure prophylaxis (PrEP). You are considered at risk if: ? You are sexually active and do not regularly use condoms or know the HIV status of your partner(s). ? You take drugs by injection. ? You are sexually active with a partner who has HIV. Talk with your health care provider about whether you are at high risk of being infected with HIV. If you choose to begin PrEP, you should first be tested for HIV. You should then be tested every 3 months for as long as you are taking PrEP. Pregnancy  If you are premenopausal and you may become pregnant, ask your health care provider about preconception counseling.  If you may become pregnant, take 400 to 800 micrograms (mcg) of folic acid every day.  If you want to prevent pregnancy, talk to your health care provider about birth control (contraception). Osteoporosis and menopause  Osteoporosis is a disease in  which the bones lose minerals and strength with aging. This can result in serious bone fractures. Your risk for osteoporosis can be identified using a bone density scan.  If you are 3 years of age or older, or if you are at risk for osteoporosis and fractures, ask your health care provider if you should be screened.  Ask your health care provider whether you should take a calcium or vitamin D supplement to lower your risk for osteoporosis.  Menopause may have certain physical symptoms and risks.  Hormone replacement therapy may reduce some of these symptoms and risks. Talk to your health care provider about whether hormone replacement therapy is right for you. Follow these instructions at home:  Schedule regular health, dental, and eye exams.  Stay current with your immunizations.  Do not use any tobacco products including cigarettes, chewing tobacco, or electronic cigarettes.  If you are pregnant, do not drink alcohol.  If you are breastfeeding, limit how much and how often you drink alcohol.  Limit alcohol intake to no more than 1 drink per day for nonpregnant women. One drink equals 12 ounces of beer, 5 ounces of Caiden Arteaga, or 1 ounces of hard liquor.  Do not use street drugs.  Do not share needles.  Ask your health care provider for help if you need support or information about quitting drugs.  Tell your health care provider if you often feel depressed.  Tell your health care provider if you have ever been abused or do not feel safe at home. This information is not intended to replace advice given to you by your health care provider. Make sure you discuss any questions you have with your health care provider. Document Released: 11/16/2010 Document Revised: 10/09/2015 Document Reviewed: 02/04/2015 [employer]vier Interactive Patient Education  2019 [employer].

## 2018-07-17 NOTE — Assessment & Plan Note (Addendum)
GERD controlled Continue daily medication - discussed zantac recall

## 2018-07-17 NOTE — Progress Notes (Addendum)
Subjective:   Tina Molina is a 73 y.o. female who presents for Medicare Annual (Subsequent) preventive examination.  Review of Systems:  No ROS.  Medicare Wellness Visit. Additional risk factors are reflected in the social history.  Cardiac Risk Factors include: advanced age (>83men, >29 women);dyslipidemia;hypertension;sedentary lifestyle Sleep patterns: has difficulty falling asleep, has interrupted sleep, gets up 2 times nightly to void and sleeps 5-6 hours nightly. Patient reports insomnia issues, discussed recommended sleep tips.   Home Safety/Smoke Alarms: Feels safe in home. Smoke alarms in place.  Living environment; residence and Firearm Safety: 1-story house/ trailer. Lives with sister, no needs for DME, good support system Seat Belt Safety/Bike Helmet: Wears seat belt.     Objective:     Vitals: BP 114/60   Pulse 91   Resp 16   Ht 5\' 4"  (1.626 m)   Wt 207 lb (93.9 kg)   SpO2 96%   BMI 35.53 kg/m   Body mass index is 35.53 kg/m.  Advanced Directives 07/17/2018 07/12/2018 06/01/2018 06/28/2017 04/20/2017 12/17/2016 10/06/2016  Does Patient Have a Medical Advance Directive? No No No No Yes No No  Does patient want to make changes to medical advance directive? - - - - Yes (ED - Information included in AVS) - -  Would patient like information on creating a medical advance directive? Yes (ED - Information included in AVS) No - Patient declined No - Patient declined No - Patient declined - - -  Pre-existing out of facility DNR order (yellow form or pink MOST form) - - - - - - -    Tobacco Social History   Tobacco Use  Smoking Status Former Smoker  . Packs/day: 0.50  . Years: 20.00  . Pack years: 10.00  . Types: Cigarettes  . Last attempt to quit: 05/17/1997  . Years since quitting: 21.1  Smokeless Tobacco Never Used     Counseling given: Not Answered  Past Medical History:  Diagnosis Date  . Anemia    Associated with leukopenia and lymphocytosis. This is been  evaluated by Dr. Marin Olp . Her platelet count has been normal  . Arthritis   . Chronic back pain   . Chronic kidney disease   . Erythropoietin deficiency anemia 02/23/2016  . GERD (gastroesophageal reflux disease)   . Hypertension   . Lymphocytosis   . MGUS (monoclonal gammopathy of unknown significance) 08/03/2012   IgA 792 11/04/11  . Pneumonia 04/2010   OP  . Renal insufficiency   . Sarcoidosis   . Thyroid disease    Past Surgical History:  Procedure Laterality Date  . CARPAL TUNNEL RELEASE     Bilateral   . CATARACT EXTRACTION Right 05/2015  . CERVICAL DISCECTOMY  06/2010   Dr.Pool  . CHOLECYSTECTOMY    . COLONOSCOPY  2010  . POSTERIOR LUMBAR FUSION  02/29/2012   Dr Alyson Locket op respiratory & renal compromise  . THYROIDECTOMY  2003   For goiter   Family History  Problem Relation Age of Onset  . Anemia Father   . Arthritis Father   . Heart failure Father 89       No CAD  . Diabetes Mother   . Arthritis Mother   . Glaucoma Mother   . Heart disease Mother 69       No CAD  . Hypertension Brother   . Anemia Brother   . Kidney disease Brother   . Hypertension Sister   . Kidney disease Sister   . Anemia Sister   .  Diabetes Maternal Aunt   . Arthritis Paternal Grandmother    Social History   Socioeconomic History  . Marital status: Divorced    Spouse name: Not on file  . Number of children: 0  . Years of education: 46  . Highest education level: Not on file  Occupational History  . Occupation: Retired    Fish farm manager: RETIRED  Social Needs  . Financial resource strain: Not hard at all  . Food insecurity:    Worry: Never true    Inability: Never true  . Transportation needs:    Medical: No    Non-medical: No  Tobacco Use  . Smoking status: Former Smoker    Packs/day: 0.50    Years: 20.00    Pack years: 10.00    Types: Cigarettes    Last attempt to quit: 05/17/1997    Years since quitting: 21.1  . Smokeless tobacco: Never Used  Substance and Sexual  Activity  . Alcohol use: Yes    Alcohol/week: 1.0 standard drinks    Types: 1 Glasses of Fransisca Shawn per week    Comment: occasionally   . Drug use: No  . Sexual activity: Not Currently  Lifestyle  . Physical activity:    Days per week: 0 days    Minutes per session: 0 min  . Stress: Not at all  Relationships  . Social connections:    Talks on phone: More than three times a week    Gets together: More than three times a week    Attends religious service: 1 to 4 times per year    Active member of club or organization: Yes    Attends meetings of clubs or organizations: 1 to 4 times per year    Relationship status: Divorced  Other Topics Concern  . Not on file  Social History Narrative   Patient is divorced and lives with her sister. Patient is retired.   Caffeine- sometimes - one cup   Right handed.    Outpatient Encounter Medications as of 07/17/2018  Medication Sig  . aspirin 81 MG tablet Take 81 mg by mouth daily.   Marland Kitchen atorvastatin (LIPITOR) 20 MG tablet TAKE 1 TABLET BY MOUTH DAILY  . carvedilol (COREG) 25 MG tablet TAKE 1 TABLET BY MOUTH TWICE A DAY  . cyclobenzaprine (FLEXERIL) 10 MG tablet Take 10 mg by mouth 3 (three) times daily as needed for muscle spasms.  . furosemide (LASIX) 20 MG tablet Take 20 mg by mouth daily.  Marland Kitchen gabapentin (NEURONTIN) 300 MG capsule TAKE 1 CAPSULE BY MOUTH EVERY 8 HOURS IF NEEDED  . hydrALAZINE (APRESOLINE) 50 MG tablet TAKE 1 TABLET(50 MG) BY MOUTH THREE TIMES DAILY NEED OFFICE VISIT FOR MORE REFILLS  . leflunomide (ARAVA) 20 MG tablet Take 20 mg by mouth daily.  Marland Kitchen levothyroxine (SYNTHROID, LEVOTHROID) 137 MCG tablet TAKE 1 TABLET BY MOUTH ONCE DAILY BEFORE BREAKFAST  . LUMIGAN 0.01 % SOLN INSTILL 1 DROP INTO BOTH EYES ONCE DAILY AT BEDTIME  . Multiple Vitamin (MULTIVITAMIN WITH MINERALS) TABS Take 1 tablet by mouth daily.  . ranitidine (ZANTAC) 150 MG tablet TAKE 1 TABLET(150 MG) BY MOUTH TWICE DAILY.  Marland Kitchen spironolactone (ALDACTONE) 50 MG tablet TAKE  1/2 TABLET(25 MG) BY MOUTH DAILY  . SYMBICORT 160-4.5 MCG/ACT inhaler inhale 2 puffs if needed for wheezing  . telmisartan (MICARDIS) 80 MG tablet TAKE 1 TABLET(80 MG) BY MOUTH DAILY  . vitamin E 400 UNIT capsule Take 400 Units by mouth daily.   Marland Kitchen zolpidem (AMBIEN) 10  MG tablet Take 0.5 tablets (5 mg total) by mouth at bedtime as needed for sleep.  . [DISCONTINUED] levothyroxine (SYNTHROID, LEVOTHROID) 137 MCG tablet TAKE 1 TABLET BY MOUTH ONCE DAILY BEFORE BREAKFAST  . [DISCONTINUED] modafinil (PROVIGIL) 100 MG tablet Take 100 mg by mouth daily.  . [DISCONTINUED] potassium chloride (MICRO-K) 10 MEQ CR capsule Take 1 capsule (10 mEq total) by mouth 2 (two) times daily.  . [DISCONTINUED] ranitidine (ZANTAC) 150 MG tablet TAKE 1 TABLET(150 MG) BY MOUTH TWICE DAILY NEEDS OFFICE VISIT FOR MORE REFILLS   No facility-administered encounter medications on file as of 07/17/2018.     Activities of Daily Living In your present state of health, do you have any difficulty performing the following activities: 07/17/2018  Hearing? N  Vision? N  Difficulty concentrating or making decisions? N  Walking or climbing stairs? N  Dressing or bathing? N  Doing errands, shopping? N  Preparing Food and eating ? N  Using the Toilet? N  In the past six months, have you accidently leaked urine? N  Do you have problems with loss of bowel control? N  Managing your Medications? N  Managing your Finances? N  Housekeeping or managing your Housekeeping? N  Some recent data might be hidden    Patient Care Team: Binnie Rail, MD as PCP - General (Internal Medicine)    Assessment:   This is a routine wellness examination for Tina Molina. Physical assessment deferred to PCP.  Exercise Activities and Dietary recommendations Current Exercise Habits: The patient does not participate in regular exercise at present(chair exercise print-outs provided)  Diet (meal preparation, eat out, water intake, caffeinated beverages,  dairy products, fruits and vegetables): in general, a "healthy" diet  , well balanced   Reviewed heart healthy diet. Encouraged patient to increase daily water and healthy fluid intake. Discussed weight loss tips and diet. Relevant patient education assigned to patient using Emmi.  Goals    . patient     Try to eat less fried foods;      Marland Kitchen Patient Stated     Increase my physical activity. I can start to do the elliptical 3 times weekly for 5 minutes.    . Patient Stated     Increase physical activity by riding elipical.        Fall Risk Fall Risk  07/17/2018 07/17/2018 10/25/2017 04/20/2017 04/20/2017  Falls in the past year? 0 0 No No Yes  Number falls in past yr: - - - 1 1  Injury with Fall? - - - No No  Risk for fall due to : - - - - -  Risk for fall due to: Comment - - - - -  Follow up - - - - -    Depression Screen PHQ 2/9 Scores 07/17/2018 04/20/2017 04/20/2017 12/31/2015  PHQ - 2 Score 0 2 0 0  PHQ- 9 Score - 4 - -     Cognitive Function MMSE - Mini Mental State Exam 04/20/2017 12/31/2015  Not completed: - (No Data)  Orientation to time 5 -  Orientation to Place 5 -  Registration 3 -  Attention/ Calculation 5 -  Recall 1 -  Language- name 2 objects 2 -  Language- repeat 1 -  Language- follow 3 step command 3 -  Language- read & follow direction 1 -  Write a sentence 1 -  Copy design 1 -  Total score 28 -       Ad8 score reviewed for issues:  Issues  making decisions: no  Less interest in hobbies / activities: no  Repeats questions, stories (family complaining): no  Trouble using ordinary gadgets (microwave, computer, phone):no  Forgets the month or year: no  Mismanaging finances: no  Remembering appts: no  Daily problems with thinking and/or memory: no Ad8 score is= 0  Immunization History  Administered Date(s) Administered  . Influenza Whole 01/20/2010  . Influenza, High Dose Seasonal PF 03/01/2013, 03/13/2015, 04/20/2017, 03/30/2018  .  Influenza,inj,Quad PF,6+ Mos 04/05/2016  . Pneumococcal Conjugate-13 01/13/2015  . Pneumococcal Polysaccharide-23 05/30/2013  . Td 06/01/2009   Screening Tests Health Maintenance  Topic Date Due  . DEXA SCAN  05/29/2013  . COLONOSCOPY  10/03/2017  . MAMMOGRAM  04/30/2018  . TETANUS/TDAP  06/02/2019  . INFLUENZA VACCINE  Completed  . Hepatitis C Screening  Completed  . PNA vac Low Risk Adult  Completed      Plan:     Reviewed health maintenance screenings with patient today and relevant education, vaccines, and/or referrals were provided.   Continue doing brain stimulating activities (puzzles, reading, adult coloring books, staying active) to keep memory sharp.   Continue to eat heart healthy diet (full of fruits, vegetables, whole grains, lean protein, water--limit salt, fat, and sugar intake) and increase physical activity as tolerated.  I have personally reviewed and noted the following in the patient's chart:   . Medical and social history . Use of alcohol, tobacco or illicit drugs  . Current medications and supplements . Functional ability and status . Nutritional status . Physical activity . Advanced directives . List of other physicians . Vitals . Screenings to include cognitive, depression, and falls . Referrals and appointments  In addition, I have reviewed and discussed with patient certain preventive protocols, quality metrics, and best practice recommendations. A written personalized care plan for preventive services as well as general preventive health recommendations were provided to patient.     Michiel Cowboy, RN  07/17/2018   Medical screening examination/treatment/procedure(s) were performed by non-physician practitioner and as supervising physician I was immediately available for consultation/collaboration. I agree with above. Binnie Rail, MD

## 2018-07-17 NOTE — Assessment & Plan Note (Signed)
Check lipid panel  Continue daily statin Regular exercise and healthy diet encouraged  

## 2018-07-17 NOTE — Assessment & Plan Note (Signed)
Intermittent left lateral breast tenderness-?  Referred pain that is musculoskeletal in nature Last mammogram 2 years ago Mildly tender lump 2:00 lateral breast Diagnostic mammogram, ultrasound ordered

## 2018-07-17 NOTE — Assessment & Plan Note (Signed)
Clinically euthyroid Check tsh  Titrate med dose if needed  

## 2018-07-19 MED ORDER — LEVOTHYROXINE SODIUM 150 MCG PO TABS: 150.0000 ug | ORAL_TABLET | Freq: Every day | ORAL | 0 refills | Status: DC

## 2018-07-28 ENCOUNTER — Ambulatory Visit
Admission: RE | Admit: 2018-07-28 | Discharge: 2018-07-28 | Disposition: A | Discharge status: Home / Self Care | Payer: Medicare Other | Source: Ambulatory Visit | Attending: Internal Medicine | Admitting: Internal Medicine

## 2018-07-28 ENCOUNTER — Other Ambulatory Visit: Payer: Self-pay

## 2018-07-28 DIAGNOSIS — R922 Inconclusive mammogram: Secondary | ICD-10-CM | POA: Diagnosis not present

## 2018-07-28 DIAGNOSIS — N644 Mastodynia: Secondary | ICD-10-CM

## 2018-07-28 DIAGNOSIS — Z13820 Encounter for screening for osteoporosis: Secondary | ICD-10-CM | POA: Diagnosis not present

## 2018-07-28 DIAGNOSIS — Z780 Asymptomatic menopausal state: Secondary | ICD-10-CM

## 2018-07-28 DIAGNOSIS — N6489 Other specified disorders of breast: Secondary | ICD-10-CM | POA: Diagnosis not present

## 2018-07-29 ENCOUNTER — Encounter: Payer: Self-pay | Admitting: Internal Medicine

## 2018-07-30 ENCOUNTER — Other Ambulatory Visit: Payer: Self-pay | Admitting: Internal Medicine

## 2018-08-14 ENCOUNTER — Other Ambulatory Visit: Payer: Self-pay | Admitting: Internal Medicine

## 2018-08-23 ENCOUNTER — Other Ambulatory Visit: Payer: Self-pay

## 2018-08-23 ENCOUNTER — Ambulatory Visit: Payer: Self-pay

## 2018-08-23 ENCOUNTER — Ambulatory Visit: Payer: Self-pay | Admitting: Family

## 2018-08-24 ENCOUNTER — Telehealth: Payer: Self-pay | Admitting: Internal Medicine

## 2018-08-24 NOTE — Telephone Encounter (Signed)
Pt called stated that zantac has been recalled an want to know if she can get another mediation sent in to the pharmacy .

## 2018-08-26 MED ORDER — FAMOTIDINE 40 MG PO TABS: 40.0000 mg | ORAL_TABLET | Freq: Two times a day (BID) | ORAL | 5 refills | Status: DC

## 2018-08-26 NOTE — Telephone Encounter (Signed)
Generic pepcid sent to [facility] - if this is not controlling her heartburn she should let us know

## 2018-08-28 NOTE — Telephone Encounter (Signed)
LVM for pt in regards.

## 2018-09-05 NOTE — Telephone Encounter (Signed)
Pt will take it OTC.

## 2018-09-05 NOTE — Telephone Encounter (Signed)
Pt states that new medication called into pharmacy is on back order and they do not believe they will have it for at least a month.  Pt wants to know what she needs to do.

## 2018-09-14 ENCOUNTER — Other Ambulatory Visit: Payer: Self-pay | Admitting: Internal Medicine

## 2018-09-29 ENCOUNTER — Other Ambulatory Visit: Payer: Self-pay | Admitting: Internal Medicine

## 2018-09-29 DIAGNOSIS — I10 Essential (primary) hypertension: Secondary | ICD-10-CM

## 2018-10-06 ENCOUNTER — Other Ambulatory Visit: Payer: Self-pay | Admitting: Internal Medicine

## 2018-10-14 ENCOUNTER — Other Ambulatory Visit: Payer: Self-pay | Admitting: Internal Medicine

## 2018-11-01 ENCOUNTER — Other Ambulatory Visit: Payer: Self-pay | Admitting: Internal Medicine

## 2018-11-04 ENCOUNTER — Other Ambulatory Visit: Payer: Self-pay | Admitting: Internal Medicine

## 2018-11-19 ENCOUNTER — Other Ambulatory Visit: Payer: Self-pay | Admitting: Internal Medicine

## 2019-01-01 ENCOUNTER — Other Ambulatory Visit: Payer: Self-pay | Admitting: Internal Medicine

## 2019-01-01 DIAGNOSIS — I10 Essential (primary) hypertension: Secondary | ICD-10-CM

## 2019-01-03 ENCOUNTER — Other Ambulatory Visit: Payer: Self-pay | Admitting: Internal Medicine

## 2019-01-17 ENCOUNTER — Ambulatory Visit: Payer: Self-pay | Admitting: Internal Medicine

## 2019-01-28 ENCOUNTER — Other Ambulatory Visit: Payer: Self-pay | Admitting: Internal Medicine

## 2019-01-29 ENCOUNTER — Other Ambulatory Visit: Payer: Self-pay | Admitting: Internal Medicine

## 2019-02-04 ENCOUNTER — Other Ambulatory Visit: Payer: Self-pay | Admitting: Internal Medicine

## 2019-02-05 ENCOUNTER — Other Ambulatory Visit: Payer: Self-pay | Admitting: Internal Medicine

## 2019-02-08 ENCOUNTER — Inpatient Hospital Stay: Payer: Medicare Other

## 2019-02-08 ENCOUNTER — Other Ambulatory Visit: Payer: Self-pay

## 2019-02-08 ENCOUNTER — Encounter: Payer: Self-pay | Admitting: Family

## 2019-02-08 ENCOUNTER — Inpatient Hospital Stay: Payer: Medicare Other | Attending: Family | Admitting: Family

## 2019-02-08 VITALS — BP 145/62 | HR 92 | Temp 97.1°F | Resp 16 | Wt 207.0 lb

## 2019-02-08 DIAGNOSIS — D631 Anemia in chronic kidney disease: Secondary | ICD-10-CM

## 2019-02-08 DIAGNOSIS — N182 Chronic kidney disease, stage 2 (mild): Secondary | ICD-10-CM | POA: Insufficient documentation

## 2019-02-08 DIAGNOSIS — D508 Other iron deficiency anemias: Secondary | ICD-10-CM

## 2019-02-08 DIAGNOSIS — D500 Iron deficiency anemia secondary to blood loss (chronic): Secondary | ICD-10-CM

## 2019-02-08 LAB — CMP (CANCER CENTER ONLY)
ALT: 8 U/L (ref 0–44)
AST: 13 U/L — ABNORMAL LOW (ref 15–41)
Albumin: 4.2 g/dL (ref 3.5–5.0)
Alkaline Phosphatase: 55 U/L (ref 38–126)
Anion gap: 8 (ref 5–15)
BUN: 41 mg/dL — ABNORMAL HIGH (ref 8–23)
CO2: 26 mmol/L (ref 22–32)
Calcium: 9.2 mg/dL (ref 8.9–10.3)
Chloride: 108 mmol/L (ref 98–111)
Creatinine: 2.83 mg/dL — ABNORMAL HIGH (ref 0.44–1.00)
GFR, Est AFR Am: 19 mL/min — ABNORMAL LOW (ref >60)
GFR, Estimated: 16 mL/min — ABNORMAL LOW (ref >60)
Glucose, Bld: 125 mg/dL — ABNORMAL HIGH (ref 70–99)
Potassium: 4.5 mmol/L (ref 3.5–5.1)
Sodium: 142 mmol/L (ref 135–145)
Total Bilirubin: 0.3 mg/dL (ref 0.3–1.2)
Total Protein: 7.4 g/dL (ref 6.5–8.1)

## 2019-02-08 LAB — CBC WITH DIFFERENTIAL (CANCER CENTER ONLY)
Abs Immature Granulocytes: 0.01 10*3/uL (ref 0.00–0.07)
Basophils Absolute: 0 10*3/uL (ref 0.0–0.1)
Basophils Relative: 1 %
Eosinophils Absolute: 0.2 10*3/uL (ref 0.0–0.5)
Eosinophils Relative: 4 %
HCT: 27.8 % — ABNORMAL LOW (ref 36.0–46.0)
Hemoglobin: 9 g/dL — ABNORMAL LOW (ref 12.0–15.0)
Immature Granulocytes: 0 %
Lymphocytes Relative: 45 %
Lymphs Abs: 2.5 10*3/uL (ref 0.7–4.0)
MCH: 32.1 pg (ref 26.0–34.0)
MCHC: 32.4 g/dL (ref 30.0–36.0)
MCV: 99.3 fL (ref 80.0–100.0)
Monocytes Absolute: 0.7 10*3/uL (ref 0.1–1.0)
Monocytes Relative: 12 %
Neutro Abs: 2.1 10*3/uL (ref 1.7–7.7)
Neutrophils Relative %: 38 %
Platelet Count: 184 10*3/uL (ref 150–400)
RBC: 2.8 MIL/uL — ABNORMAL LOW (ref 3.87–5.11)
RDW: 13.1 % (ref 11.5–15.5)
WBC Count: 5.4 10*3/uL (ref 4.0–10.5)
nRBC: 0 % (ref 0.0–0.2)

## 2019-02-08 LAB — RETICULOCYTES
Immature Retic Fract: 10.5 % (ref 2.3–15.9)
RBC.: 2.82 MIL/uL — ABNORMAL LOW (ref 3.87–5.11)
Retic Count, Absolute: 43.7 10*3/uL (ref 19.0–186.0)
Retic Ct Pct: 1.6 % (ref 0.4–3.1)

## 2019-02-08 MED ORDER — DARBEPOETIN ALFA 300 MCG/0.6ML IJ SOSY
300.0000 ug | PREFILLED_SYRINGE | Freq: Once | INTRAMUSCULAR | Status: AC
  Administered 2019-02-08: 300 ug via SUBCUTANEOUS

## 2019-02-08 MED ORDER — DARBEPOETIN ALFA 300 MCG/0.6ML IJ SOSY
PREFILLED_SYRINGE | INTRAMUSCULAR | Status: AC
  Filled 2019-02-08: qty 0.6

## 2019-02-08 NOTE — Progress Notes (Signed)
Hematology and Oncology Follow Up Visit  Tina Molina [id number] 1945-05-31 73 y.o. 02/08/2019   Principle Diagnosis:  Anemia of chronic renal failure - stage II MGUS - IgA lambda Iron deficiency anemia  Current Therapy:   Aranesp 300 mcg SQ to keep Hgb > 11 IV iron as indicated   Interim History:  [name] is here today for follow-up. She is doing well but has noted some fatigue. She states that today is the first day she has been out of her home since February! She has not noted had any episodes of bleeding. No bruising or petechiae.  No fever, chills, n/v, cough, rash, dizziness, SOB, chest pain, palpitations, abdominal pain or changes in bowel or bladder habits.  No swelling or tenderness in her extremities. She has some numbness and tingling in her toes all the time since an old back surgery. She will sometimes have tingling in her fingertips as well.  No falls or syncopal episodes.  She has a good appetite and is staying well hydrated. Her weight is stable.   ECOG Performance Status: 1 - Symptomatic but completely ambulatory  Medications:  Allergies as of 02/08/2019      Reactions   Penicillins Other (See Comments)   Intolerance or allergy not remembered; her sister stated penicillin caused "boils under the skin"   Shellfish Allergy Rash   Shellfish-derived Products Anaphylaxis   Sulfonamide Derivatives    Arthralgias; her sister stated that this caused a rash.   Codeine Nausea Only   Nausea only with liquid codeine   Infliximab Other (See Comments)    Confusion (intolerance)   Azithromycin Itching      Medication List       Accurate as of February 08, 2019  2:54 PM. If you have any questions, ask your nurse or doctor.        aspirin 81 MG tablet Take 81 mg by mouth daily.   atorvastatin 20 MG tablet Commonly known as: LIPITOR Take 1 tablet (20 mg total) by mouth daily. Need office visit for more refills.   carvedilol 25 MG tablet Commonly known as:  COREG TAKE 1 TABLET BY MOUTH TWICE A DAY   cyclobenzaprine 10 MG tablet Commonly known as: FLEXERIL Take 10 mg by mouth 3 (three) times daily as needed for muscle spasms.   famotidine 40 MG tablet Commonly known as: Pepcid Take 1 tablet (40 mg total) by mouth 2 (two) times daily.   furosemide 20 MG tablet Commonly known as: LASIX Take 20 mg by mouth daily.   gabapentin 300 MG capsule Commonly known as: NEURONTIN TAKE 1 CAPSULE BY MOUTH EVERY 8 HOURS AS NEEDED   hydrALAZINE 50 MG tablet Commonly known as: APRESOLINE TAKE 1 TABLET(50 MG) BY MOUTH THREE TIMES DAILY   leflunomide 20 MG tablet Commonly known as: ARAVA Take 20 mg by mouth daily.   levothyroxine 137 MCG tablet Commonly known as: SYNTHROID TAKE 1 TABLET BY MOUTH EVERY DAY BEFORE BREAKFAST   levothyroxine 150 MCG tablet Commonly known as: SYNTHROID Take 1 tablet by mouth once daily before breakfast. Need office visit for more refills.   Lumigan 0.01 % Soln Generic drug: bimatoprost INSTILL 1 DROP INTO BOTH EYES ONCE DAILY AT BEDTIME   multivitamin with minerals Tabs tablet Take 1 tablet by mouth daily.   spironolactone 50 MG tablet Commonly known as: ALDACTONE Take 1/2 tablet (25 MCG) by mouth daily. Need office visit for more refills   Symbicort 160-4.5 MCG/ACT inhaler Generic drug: budesonide-formoterol inhale  2 puffs if needed for wheezing   telmisartan 80 MG tablet Commonly known as: MICARDIS TAKE 1 TABLET(80 MG) BY MOUTH DAILY   vitamin E 400 UNIT capsule Take 400 Units by mouth daily.   zolpidem 10 MG tablet Commonly known as: AMBIEN Take 0.5 tablets (5 mg total) by mouth at bedtime as needed for sleep.       Allergies:  Allergies  Allergen Reactions  . Penicillins Other (See Comments)    Intolerance or allergy not remembered; her sister stated penicillin caused "boils under the skin"  . Shellfish Allergy Rash  . Shellfish-Derived Products Anaphylaxis  . Sulfonamide Derivatives      Arthralgias; her sister stated that this caused a rash.  . Codeine Nausea Only    Nausea only with liquid codeine  . Infliximab Other (See Comments)     Confusion (intolerance)  . Azithromycin Itching    Past Medical History, Surgical history, Social history, and Family History were reviewed and updated.  Review of Systems: All other 10 point review of systems is negative.   Physical Exam:  vitals were not taken for this visit.   Wt Readings from Last 3 Encounters:  07/17/18 207 lb (93.9 kg)  07/17/18 207 lb 12.8 oz (94.3 kg)  07/12/18 206 lb 8 oz (93.7 kg)    Ocular: Sclerae unicteric, pupils equal, round and reactive to light Ear-nose-throat: Oropharynx clear, dentition fair Lymphatic: No cervical or supraclavicular adenopathy Lungs no rales or rhonchi, good excursion bilaterally Heart regular rate and rhythm, no murmur appreciated Abd soft, nontender, positive bowel sounds, no liver or spleen tip palpated on exam, no fluid wave  MSK no focal spinal tenderness, no joint edema Neuro: non-focal, well-oriented, appropriate affect Breasts: Deferred   Lab Results  Component Value Date   WBC 5.4 02/08/2019   HGB 9.0 (L) 02/08/2019   HCT 27.8 (L) 02/08/2019   MCV 99.3 02/08/2019   PLT 184 02/08/2019   Lab Results  Component Value Date   FERRITIN 205 07/12/2018   IRON 70 07/12/2018   TIBC 289 07/12/2018   UIBC 219 07/12/2018   IRONPCTSAT 24 07/12/2018   Lab Results  Component Value Date   RETICCTPCT 1.6 02/08/2019   RBC 2.80 (L) 02/08/2019   RBC 2.82 (L) 02/08/2019   RETICCTABS 51.6 01/03/2013   Lab Results  Component Value Date   KPAFRELGTCHN 18.6 06/01/2018   LAMBDASER 30.0 (H) 06/01/2018   KAPLAMBRATIO 0.62 06/01/2018   Lab Results  Component Value Date   IGGSERUM 902 06/01/2018   IGA 513 (H) 06/01/2018   IGMSERUM 26 06/01/2018   Lab Results  Component Value Date   TOTALPROTELP 6.6 06/01/2018   ALBUMINELP 4.2 06/01/2018   A1GS 0.2 06/01/2018   A2GS  0.5 06/01/2018   BETS 1.1 06/01/2018   BETA2SER 10.3 (H) 11/04/2011   GAMS 0.7 06/01/2018   MSPIKE 0.2 (H) 06/01/2018   SPEI Comment 06/01/2018     Chemistry      Component Value Date/Time   NA 141 07/17/2018 1444   NA 150 (H) 03/01/2017 1308   NA 143 04/05/2016 1156   K 4.3 07/17/2018 1444   K 4.0 03/01/2017 1308   K 3.8 04/05/2016 1156   CL 105 07/17/2018 1444   CL 109 (H) 03/01/2017 1308   CO2 28 07/17/2018 1444   CO2 28 03/01/2017 1308   CO2 21 (L) 04/05/2016 1156   BUN 27 (H) 07/17/2018 1444   BUN 19 03/01/2017 1308   BUN 18.5 04/05/2016 1156  CREATININE 2.08 (H) 07/17/2018 1444   CREATININE 2.00 (H) 07/12/2018 1238   CREATININE 1.8 (H) 03/01/2017 1308   CREATININE 1.5 (H) 04/05/2016 1156      Component Value Date/Time   CALCIUM 9.4 07/17/2018 1444   CALCIUM 9.7 03/01/2017 1308   CALCIUM 9.3 04/05/2016 1156   ALKPHOS 51 07/17/2018 1444   ALKPHOS 55 03/01/2017 1308   ALKPHOS 77 04/05/2016 1156   AST 15 07/17/2018 1444   AST 17 07/12/2018 1238   AST 21 04/05/2016 1156   ALT 10 07/17/2018 1444   ALT 11 07/12/2018 1238   ALT 18 03/01/2017 1308   ALT 22 04/05/2016 1156   BILITOT 0.3 07/17/2018 1444   BILITOT 0.3 07/12/2018 1238   BILITOT 0.36 04/05/2016 1156       Impression and Plan: Ms. Hegg is a very pleasant 73 yo African American female with multifactorial anemia as well as an IgA lambda MGUS.   She is having some mild fatigue.  She received Aranesp today. We will see what her iron studies show and bring her back in for infusion if needed.  We will plan to see her back ina other month.  She will contact our office with any questions or concerns. We can certainly see her sooner if needed.   Laverna Peace, NP 9/24/20202:54 PM

## 2019-02-09 ENCOUNTER — Telehealth: Payer: Self-pay | Admitting: Family

## 2019-02-09 LAB — IRON AND TIBC
Iron: 79 ug/dL (ref 41–142)
Saturation Ratios: 27 % (ref 21–57)
TIBC: 291 ug/dL (ref 236–444)
UIBC: 212 ug/dL (ref 120–384)

## 2019-02-09 LAB — FERRITIN: Ferritin: 123 ng/mL (ref 11–307)

## 2019-02-09 NOTE — Telephone Encounter (Signed)
lmom to inform patient of appts per 9/24 LOS

## 2019-02-11 NOTE — Patient Instructions (Addendum)
  Tests ordered today. Your results will be released to [facility] (or called to you) after review.  If any changes need to be made, you will be notified at that same time.  Flu immunization administered today.    Medications reviewed and updated.  Changes include :   none  Your prescription(s) have been submitted to your pharmacy. Please take as directed and contact our office if you believe you are having problem(s) with the medication(s).    Please followup in 6 months

## 2019-02-11 NOTE — Progress Notes (Signed)
Subjective:    Patient ID: Tina Molina, female    DOB: 06-17-1945, 73 y.o.   MRN: [id number]  HPI The patient is here for follow up.  She is not exercising regularly.    Hypertension: She is taking her medication daily. She is not compliant with a low sodium diet-her sister has been doing some cooking for her.  She denies chest pain and regular headaches.  Hypothyroidism:  She is taking her medication daily.  She denies any recent changes in energy or weight that are unexplained.  Overall she feels her energy level is low, but that is not new.  Her last TSH was high and she has not had her TSH rechecked since then.  Hyperlipidemia: She is taking her medication daily. She is compliant with a low fat/cholesterol diet. She denies myalgias.   GERD:  She is taking her medication daily as prescribed.  She denies any GERD symptoms and feels her GERD is well controlled.   Chronic kidney disease:  She is following with nephrology.  She is taking lasix daily.  She thinks her leg swelling is worse.  It is typically minimal to none first thing in the morning and then gets worse as the day goes on.  She has not been compliant with a low-sodium diet.    Medications and allergies reviewed with patient and updated if appropriate.  Patient Active Problem List   Diagnosis Date Noted  . Breast tenderness in female 07/17/2018  . Nocturia more than twice per night 10/25/2017  . Insomnia secondary to chronic pain 10/25/2017  . Upper back pain on left side 04/20/2017  . IDA (iron deficiency anemia) 04/06/2016  . Erythropoietin deficiency anemia 02/23/2016  . GERD (gastroesophageal reflux disease) 06/25/2015  . Hyperlipidemia 01/13/2015  . Hyperglycemia 01/13/2015  . Rheumatoid factor positive 02/20/2014  . Thoracic radiculopathy 10/30/2013  . Circadian rhythm sleep disorder, shift work type 11/03/2012  . Hematuria 08/05/2012  . MGUS (monoclonal gammopathy of unknown significance) 08/03/2012   . Lumbar stenosis with neurogenic claudication 02/29/2012  . Edema 09/13/2011  . CKD (chronic kidney disease) stage 3, GFR 30-59 ml/min (HCC) 12/19/2009  . HERNIATED LUMBAR DISC 12/19/2009  . PULMONARY SARCOIDOSIS 10/16/2009  . Hypothyroidism 10/16/2009  . Anemia associated with stage 2 chronic renal failure 10/16/2009  . CARPAL TUNNEL SYNDROME, BILATERAL 10/16/2009  . UVEITIS 10/16/2009  . Essential hypertension 10/16/2009  . ARTHRITIS 10/16/2009  . CERVICAL DISC DISORDER 10/16/2009    Current Outpatient Medications on File Prior to Visit  Medication Sig Dispense Refill  . aspirin 81 MG tablet Take 81 mg by mouth daily.     Marland Kitchen atorvastatin (LIPITOR) 20 MG tablet Take 1 tablet (20 mg total) by mouth daily. Need office visit for more refills. 30 tablet 0  . carvedilol (COREG) 25 MG tablet TAKE 1 TABLET BY MOUTH TWICE A DAY 180 tablet 1  . cyclobenzaprine (FLEXERIL) 10 MG tablet Take 10 mg by mouth 3 (three) times daily as needed for muscle spasms.    . famotidine (PEPCID) 40 MG tablet Take 1 tablet (40 mg total) by mouth 2 (two) times daily. 60 tablet 5  . furosemide (LASIX) 20 MG tablet Take 20 mg by mouth daily.    Marland Kitchen gabapentin (NEURONTIN) 300 MG capsule TAKE 1 CAPSULE BY MOUTH EVERY 8 HOURS AS NEEDED 90 capsule 0  . hydrALAZINE (APRESOLINE) 50 MG tablet TAKE 1 TABLET(50 MG) BY MOUTH THREE TIMES DAILY 270 tablet 0  . leflunomide (ARAVA) 20 MG  tablet Take 20 mg by mouth daily.    Marland Kitchen levothyroxine (SYNTHROID) 150 MCG tablet Take 1 tablet by mouth once daily before breakfast. Need office visit for more refills. 30 tablet 0  . LUMIGAN 0.01 % SOLN INSTILL 1 DROP INTO BOTH EYES ONCE DAILY AT BEDTIME  0  . MELATONIN GUMMIES PO Take by mouth at bedtime as needed.    . Multiple Vitamin (MULTIVITAMIN WITH MINERALS) TABS Take 1 tablet by mouth daily.    Marland Kitchen spironolactone (ALDACTONE) 50 MG tablet Take 1/2 tablet (25 MCG) by mouth daily. Need office visit for more refills 15 tablet 0  . SYMBICORT  160-4.5 MCG/ACT inhaler inhale 2 puffs if needed for wheezing 10.2 g 0  . telmisartan (MICARDIS) 80 MG tablet TAKE 1 TABLET(80 MG) BY MOUTH DAILY 90 tablet 0  . vitamin E 400 UNIT capsule Take 400 Units by mouth daily.     . [DISCONTINUED] modafinil (PROVIGIL) 100 MG tablet Take 100 mg by mouth daily.    . [DISCONTINUED] potassium chloride (MICRO-K) 10 MEQ CR capsule Take 1 capsule (10 mEq total) by mouth 2 (two) times daily. 180 capsule 1   No current facility-administered medications on file prior to visit.     Past Medical History:  Diagnosis Date  . Anemia    Associated with leukopenia and lymphocytosis. This is been evaluated by Dr. Marin Olp . Her platelet count has been normal  . Arthritis   . Chronic back pain   . Chronic kidney disease   . Erythropoietin deficiency anemia 02/23/2016  . GERD (gastroesophageal reflux disease)   . Hypertension   . Lymphocytosis   . MGUS (monoclonal gammopathy of unknown significance) 08/03/2012   IgA 792 11/04/11  . Pneumonia 04/2010   OP  . Renal insufficiency   . Sarcoidosis   . Thyroid disease     Past Surgical History:  Procedure Laterality Date  . CARPAL TUNNEL RELEASE     Bilateral   . CATARACT EXTRACTION Right 05/2015  . CERVICAL DISCECTOMY  06/2010   Dr.Pool  . CHOLECYSTECTOMY    . COLONOSCOPY  2010  . POSTERIOR LUMBAR FUSION  02/29/2012   Dr Alyson Locket op respiratory & renal compromise  . THYROIDECTOMY  2003   For goiter    Social History   Socioeconomic History  . Marital status: Divorced    Spouse name: Not on file  . Number of children: 0  . Years of education: 24  . Highest education level: Not on file  Occupational History  . Occupation: Retired    Fish farm manager: RETIRED  Social Needs  . Financial resource strain: Not hard at all  . Food insecurity    Worry: Never true    Inability: Never true  . Transportation needs    Medical: No    Non-medical: No  Tobacco Use  . Smoking status: Former Smoker    Packs/day:  0.50    Years: 20.00    Pack years: 10.00    Types: Cigarettes    Quit date: 05/17/1997    Years since quitting: 21.7  . Smokeless tobacco: Never Used  Substance and Sexual Activity  . Alcohol use: Yes    Alcohol/week: 1.0 standard drinks    Types: 1 Glasses of wine per week    Comment: occasionally   . Drug use: No  . Sexual activity: Not Currently  Lifestyle  . Physical activity    Days per week: 0 days    Minutes per session: 0 min  .  Stress: Not at all  Relationships  . Social connections    Talks on phone: More than three times a week    Gets together: More than three times a week    Attends religious service: 1 to 4 times per year    Active member of club or organization: Yes    Attends meetings of clubs or organizations: 1 to 4 times per year    Relationship status: Divorced  Other Topics Concern  . Not on file  Social History Narrative   Patient is divorced and lives with her sister. Patient is retired.   Caffeine- sometimes - one cup   Right handed.    Family History  Problem Relation Age of Onset  . Anemia Father   . Arthritis Father   . Heart failure Father 30       No CAD  . Diabetes Mother   . Arthritis Mother   . Glaucoma Mother   . Heart disease Mother 32       No CAD  . Hypertension Brother   . Anemia Brother   . Kidney disease Brother   . Hypertension Sister   . Kidney disease Sister   . Anemia Sister   . Diabetes Maternal Aunt   . Arthritis Paternal Grandmother     Review of Systems  Constitutional: Positive for fatigue. Negative for chills and fever.  Respiratory: Positive for shortness of breath (with exertion - maybe worse). Negative for cough and wheezing.   Cardiovascular: Positive for palpitations (with exertion) and leg swelling. Negative for chest pain.  Neurological: Positive for light-headedness (occ). Negative for headaches.  Psychiatric/Behavioral: Negative for sleep disturbance.       Objective:   Vitals:   02/12/19 1053   BP: 132/60  Pulse: 89  Resp: 16  Temp: 99.3 F (37.4 C)  SpO2: 96%   BP Readings from Last 3 Encounters:  02/12/19 132/60  02/08/19 (!) 145/62  07/17/18 114/60   Wt Readings from Last 3 Encounters:  02/12/19 206 lb 12.8 oz (93.8 kg)  02/08/19 207 lb (93.9 kg)  07/17/18 207 lb (93.9 kg)   Body mass index is 35.5 kg/m.   Physical Exam    Constitutional: Appears well-developed and well-nourished. No distress.  HENT:  Head: Normocephalic and atraumatic.  Neck: Neck supple. No tracheal deviation present. No thyromegaly present.  No cervical lymphadenopathy Cardiovascular: Normal rate, regular rhythm and normal heart sounds.   2/6 systolic murmur heard. No carotid bruit .  Trace bilateral lower extremity edema Pulmonary/Chest: Effort normal and breath sounds normal. No respiratory distress. No has no wheezes. No rales.  Skin: Skin is warm and dry. Not diaphoretic.  Psychiatric: Normal mood and affect. Behavior is normal.      Assessment & Plan:    See Problem List for Assessment and Plan of chronic medical problems.

## 2019-02-12 ENCOUNTER — Encounter: Payer: Self-pay | Admitting: Internal Medicine

## 2019-02-12 ENCOUNTER — Other Ambulatory Visit (INDEPENDENT_AMBULATORY_CARE_PROVIDER_SITE_OTHER): Payer: Medicare Other

## 2019-02-12 ENCOUNTER — Other Ambulatory Visit: Payer: Self-pay

## 2019-02-12 ENCOUNTER — Ambulatory Visit (INDEPENDENT_AMBULATORY_CARE_PROVIDER_SITE_OTHER): Payer: Medicare Other | Admitting: Internal Medicine

## 2019-02-12 VITALS — BP 132/60 | HR 89 | Temp 99.3°F | Resp 16 | Ht 64.0 in | Wt 206.8 lb

## 2019-02-12 DIAGNOSIS — N184 Chronic kidney disease, stage 4 (severe): Secondary | ICD-10-CM

## 2019-02-12 DIAGNOSIS — N183 Chronic kidney disease, stage 3 unspecified: Secondary | ICD-10-CM

## 2019-02-12 DIAGNOSIS — R600 Localized edema: Secondary | ICD-10-CM | POA: Diagnosis not present

## 2019-02-12 DIAGNOSIS — K219 Gastro-esophageal reflux disease without esophagitis: Secondary | ICD-10-CM

## 2019-02-12 DIAGNOSIS — E890 Postprocedural hypothyroidism: Secondary | ICD-10-CM

## 2019-02-12 DIAGNOSIS — Z23 Encounter for immunization: Secondary | ICD-10-CM | POA: Diagnosis not present

## 2019-02-12 DIAGNOSIS — I10 Essential (primary) hypertension: Secondary | ICD-10-CM

## 2019-02-12 DIAGNOSIS — E7849 Other hyperlipidemia: Secondary | ICD-10-CM

## 2019-02-12 LAB — COMPREHENSIVE METABOLIC PANEL
ALT: 9 U/L (ref 0–35)
AST: 13 U/L (ref 0–37)
Albumin: 4.3 g/dL (ref 3.5–5.2)
Alkaline Phosphatase: 55 U/L (ref 39–117)
BUN: 42 mg/dL — ABNORMAL HIGH (ref 6–23)
CO2: 28 mEq/L (ref 19–32)
Calcium: 9.4 mg/dL (ref 8.4–10.5)
Chloride: 107 mEq/L (ref 96–112)
Creatinine, Ser: 3.23 mg/dL — ABNORMAL HIGH (ref 0.40–1.20)
GFR: 17 mL/min — ABNORMAL LOW (ref >60.00)
Glucose, Bld: 107 mg/dL — ABNORMAL HIGH (ref 70–99)
Potassium: 4.7 mEq/L (ref 3.5–5.1)
Sodium: 143 mEq/L (ref 135–145)
Total Bilirubin: 0.3 mg/dL (ref 0.2–1.2)
Total Protein: 7.5 g/dL (ref 6.0–8.3)

## 2019-02-12 LAB — TSH: TSH: 7.83 u[IU]/mL — ABNORMAL HIGH (ref 0.35–4.50)

## 2019-02-12 MED ORDER — SPIRONOLACTONE 50 MG PO TABS: ORAL_TABLET | ORAL | 1 refills | Status: DC

## 2019-02-12 MED ORDER — CARVEDILOL 25 MG PO TABS: 25.0000 mg | ORAL_TABLET | Freq: Two times a day (BID) | ORAL | 1 refills | Status: DC

## 2019-02-12 MED ORDER — TELMISARTAN 80 MG PO TABS: ORAL_TABLET | ORAL | 1 refills | Status: DC

## 2019-02-12 MED ORDER — ATORVASTATIN CALCIUM 20 MG PO TABS: 20.0000 mg | ORAL_TABLET | Freq: Every day | ORAL | 1 refills | Status: DC

## 2019-02-12 NOTE — Assessment & Plan Note (Signed)
Last saw Dr. Hollie Salk February Kidney function worse last week, will repeat today She has not been compliant with a low-sodium diet and stressed the importance of a low-sodium diet Encourage more regular exercise

## 2019-02-12 NOTE — Assessment & Plan Note (Signed)
Continue statin. 

## 2019-02-12 NOTE — Assessment & Plan Note (Signed)
Chronic leg edema She thinks her leg edema is worse, but currently has minimal edema on exam Edema tends to get worse during the day-advised more compliance with low-sodium diet and elevating legs Continue furosemide 20 mg daily CMP today

## 2019-02-12 NOTE — Assessment & Plan Note (Signed)
GERD controlled Continue daily medication  

## 2019-02-12 NOTE — Assessment & Plan Note (Signed)
Has fatigue, which is somewhat chronic-likely multifactorial in nature, but TSH was elevated at the last appointment and this has not been rechecked Check TSH We will titrate medication dose if necessary

## 2019-02-12 NOTE — Assessment & Plan Note (Signed)
Blood pressure well controlled Continue current medications at current doses CMP 

## 2019-02-13 ENCOUNTER — Other Ambulatory Visit: Payer: Self-pay | Admitting: Internal Medicine

## 2019-02-13 MED ORDER — LEVOTHYROXINE SODIUM 175 MCG PO TABS: 175.0000 ug | ORAL_TABLET | Freq: Every day | ORAL | 1 refills | Status: DC

## 2019-02-20 DIAGNOSIS — H52223 Regular astigmatism, bilateral: Secondary | ICD-10-CM | POA: Diagnosis not present

## 2019-02-20 DIAGNOSIS — H40053 Ocular hypertension, bilateral: Secondary | ICD-10-CM | POA: Diagnosis not present

## 2019-02-20 DIAGNOSIS — H524 Presbyopia: Secondary | ICD-10-CM | POA: Diagnosis not present

## 2019-02-26 DIAGNOSIS — M5136 Other intervertebral disc degeneration, lumbar region: Secondary | ICD-10-CM | POA: Diagnosis not present

## 2019-02-26 DIAGNOSIS — Z6835 Body mass index (BMI) 35.0-35.9, adult: Secondary | ICD-10-CM | POA: Diagnosis not present

## 2019-02-26 DIAGNOSIS — M0589 Other rheumatoid arthritis with rheumatoid factor of multiple sites: Secondary | ICD-10-CM | POA: Diagnosis not present

## 2019-02-26 DIAGNOSIS — D860 Sarcoidosis of lung: Secondary | ICD-10-CM | POA: Diagnosis not present

## 2019-02-26 DIAGNOSIS — M150 Primary generalized (osteo)arthritis: Secondary | ICD-10-CM | POA: Diagnosis not present

## 2019-02-26 DIAGNOSIS — E669 Obesity, unspecified: Secondary | ICD-10-CM | POA: Diagnosis not present

## 2019-03-07 ENCOUNTER — Other Ambulatory Visit: Payer: Self-pay | Admitting: Internal Medicine

## 2019-03-08 ENCOUNTER — Encounter: Payer: Self-pay | Admitting: Family

## 2019-03-08 ENCOUNTER — Other Ambulatory Visit: Payer: Self-pay

## 2019-03-08 ENCOUNTER — Inpatient Hospital Stay: Payer: Medicare Other

## 2019-03-08 ENCOUNTER — Inpatient Hospital Stay: Payer: Medicare Other | Attending: Family | Admitting: Family

## 2019-03-08 VITALS — BP 135/57 | HR 91 | Temp 97.1°F | Resp 18 | Wt 201.0 lb

## 2019-03-08 DIAGNOSIS — D508 Other iron deficiency anemias: Secondary | ICD-10-CM

## 2019-03-08 DIAGNOSIS — D472 Monoclonal gammopathy: Secondary | ICD-10-CM | POA: Insufficient documentation

## 2019-03-08 DIAGNOSIS — D631 Anemia in chronic kidney disease: Secondary | ICD-10-CM | POA: Diagnosis not present

## 2019-03-08 DIAGNOSIS — E611 Iron deficiency: Secondary | ICD-10-CM | POA: Insufficient documentation

## 2019-03-08 DIAGNOSIS — N182 Chronic kidney disease, stage 2 (mild): Secondary | ICD-10-CM | POA: Diagnosis not present

## 2019-03-08 LAB — CMP (CANCER CENTER ONLY)
ALT: 9 U/L (ref 0–44)
AST: 15 U/L (ref 15–41)
Albumin: 4.6 g/dL (ref 3.5–5.0)
Alkaline Phosphatase: 53 U/L (ref 38–126)
Anion gap: 10 (ref 5–15)
BUN: 52 mg/dL — ABNORMAL HIGH (ref 8–23)
CO2: 25 mmol/L (ref 22–32)
Calcium: 9.9 mg/dL (ref 8.9–10.3)
Chloride: 108 mmol/L (ref 98–111)
Creatinine: 2.78 mg/dL — ABNORMAL HIGH (ref 0.44–1.00)
GFR, Est AFR Am: 19 mL/min — ABNORMAL LOW (ref >60)
GFR, Estimated: 16 mL/min — ABNORMAL LOW (ref >60)
Glucose, Bld: 113 mg/dL — ABNORMAL HIGH (ref 70–99)
Potassium: 4.7 mmol/L (ref 3.5–5.1)
Sodium: 143 mmol/L (ref 135–145)
Total Bilirubin: 0.4 mg/dL (ref 0.3–1.2)
Total Protein: 7.6 g/dL (ref 6.5–8.1)

## 2019-03-08 LAB — CBC WITH DIFFERENTIAL (CANCER CENTER ONLY)
Abs Immature Granulocytes: 0 10*3/uL (ref 0.00–0.07)
Basophils Absolute: 0 10*3/uL (ref 0.0–0.1)
Basophils Relative: 1 %
Eosinophils Absolute: 0.2 10*3/uL (ref 0.0–0.5)
Eosinophils Relative: 5 %
HCT: 33.1 % — ABNORMAL LOW (ref 36.0–46.0)
Hemoglobin: 10.5 g/dL — ABNORMAL LOW (ref 12.0–15.0)
Immature Granulocytes: 0 %
Lymphocytes Relative: 53 %
Lymphs Abs: 1.8 10*3/uL (ref 0.7–4.0)
MCH: 32.3 pg (ref 26.0–34.0)
MCHC: 31.7 g/dL (ref 30.0–36.0)
MCV: 101.8 fL — ABNORMAL HIGH (ref 80.0–100.0)
Monocytes Absolute: 0.4 10*3/uL (ref 0.1–1.0)
Monocytes Relative: 13 %
Neutro Abs: 1 10*3/uL — ABNORMAL LOW (ref 1.7–7.7)
Neutrophils Relative %: 28 %
Platelet Count: 209 10*3/uL (ref 150–400)
RBC: 3.25 MIL/uL — ABNORMAL LOW (ref 3.87–5.11)
RDW: 13.9 % (ref 11.5–15.5)
WBC Count: 3.5 10*3/uL — ABNORMAL LOW (ref 4.0–10.5)
nRBC: 0 % (ref 0.0–0.2)

## 2019-03-08 LAB — RETICULOCYTES
Immature Retic Fract: 8.5 % (ref 2.3–15.9)
RBC.: 3.31 MIL/uL — ABNORMAL LOW (ref 3.87–5.11)
Retic Count, Absolute: 42 10*3/uL (ref 19.0–186.0)
Retic Ct Pct: 1.3 % (ref 0.4–3.1)

## 2019-03-08 MED ORDER — DARBEPOETIN ALFA 300 MCG/0.6ML IJ SOSY
300.0000 ug | PREFILLED_SYRINGE | Freq: Once | INTRAMUSCULAR | Status: AC
  Administered 2019-03-08: 300 ug via SUBCUTANEOUS

## 2019-03-08 MED ORDER — DARBEPOETIN ALFA 300 MCG/0.6ML IJ SOSY
PREFILLED_SYRINGE | INTRAMUSCULAR | Status: AC
  Filled 2019-03-08: qty 0.6

## 2019-03-08 NOTE — Patient Instructions (Signed)
Darbepoetin Alfa injection What is this medicine? DARBEPOETIN ALFA (dar be POE e tin AL fa) helps your body make more red blood cells. It is used to treat anemia caused by chronic kidney failure and chemotherapy. This medicine may be used for other purposes; ask your health care provider or pharmacist if you have questions. COMMON BRAND NAME(S): Aranesp What should I tell my health care provider before I take this medicine? They need to know if you have any of these conditions:  blood clotting disorders or history of blood clots  cancer patient not on chemotherapy  cystic fibrosis  heart disease, such as angina, heart failure, or a history of a heart attack  hemoglobin level of 12 g/dL or greater  high blood pressure  low levels of folate, iron, or vitamin B12  seizures  an unusual or allergic reaction to darbepoetin, erythropoietin, albumin, hamster proteins, latex, other medicines, foods, dyes, or preservatives  pregnant or trying to get pregnant  breast-feeding How should I use this medicine? This medicine is for injection into a vein or under the skin. It is usually given by a health care professional in a hospital or clinic setting. If you get this medicine at home, you will be taught how to prepare and give this medicine. Use exactly as directed. Take your medicine at regular intervals. Do not take your medicine more often than directed. It is important that you put your used needles and syringes in a special sharps container. Do not put them in a trash can. If you do not have a sharps container, call your pharmacist or healthcare provider to get one. A special MedGuide will be given to you by the pharmacist with each prescription and refill. Be sure to read this information carefully each time. Talk to your pediatrician regarding the use of this medicine in children. While this medicine may be used in children as young as 1 month of age for selected conditions, precautions do  apply. Overdosage: If you think you have taken too much of this medicine contact a poison control center or emergency room at once. NOTE: This medicine is only for you. Do not share this medicine with others. What if I miss a dose? If you miss a dose, take it as soon as you can. If it is almost time for your next dose, take only that dose. Do not take double or extra doses. What may interact with this medicine? Do not take this medicine with any of the following medications:  epoetin alfa This list may not describe all possible interactions. Give your health care provider a list of all the medicines, herbs, non-prescription drugs, or dietary supplements you use. Also tell them if you smoke, drink alcohol, or use illegal drugs. Some items may interact with your medicine. What should I watch for while using this medicine? Your condition will be monitored carefully while you are receiving this medicine. You may need blood work done while you are taking this medicine. This medicine may cause a decrease in vitamin B6. You should make sure that you get enough vitamin B6 while you are taking this medicine. Discuss the foods you eat and the vitamins you take with your health care professional. What side effects may I notice from receiving this medicine? Side effects that you should report to your doctor or health care professional as soon as possible:  allergic reactions like skin rash, itching or hives, swelling of the face, lips, or tongue  breathing problems  changes in   vision  chest pain  confusion, trouble speaking or understanding  feeling faint or lightheaded, falls  high blood pressure  muscle aches or pains  pain, swelling, warmth in the leg  rapid weight gain  severe headaches  sudden numbness or weakness of the face, arm or leg  trouble walking, dizziness, loss of balance or coordination  seizures (convulsions)  swelling of the ankles, feet, hands  unusually weak or  tired Side effects that usually do not require medical attention (report to your doctor or health care professional if they continue or are bothersome):  diarrhea  fever, chills (flu-like symptoms)  headaches  nausea, vomiting  redness, stinging, or swelling at site where injected This list may not describe all possible side effects. Call your doctor for medical advice about side effects. You may report side effects to FDA at 1-800-FDA-1088. Where should I keep my medicine? Keep out of the reach of children. Store in a refrigerator between 2 and 8 degrees C (36 and 46 degrees F). Do not freeze. Do not shake. Throw away any unused portion if using a single-dose vial. Throw away any unused medicine after the expiration date. NOTE: This sheet is a summary. It may not cover all possible information. If you have questions about this medicine, talk to your doctor, pharmacist, or health care provider.  2020 Elsevier/Gold Standard (2017-05-18 16:44:20)  

## 2019-03-08 NOTE — Progress Notes (Signed)
Hematology and Oncology Follow Up Visit  Tina Molina [id number] 1946-02-14 73 y.o. 03/08/2019   Principle Diagnosis:  Anemia of chronic renal failure - stage II MGUS - IgA lambda Iron deficiency anemia  Current Therapy:   Aranesp 300 mcg SQ to keep Hgb > 11 IV iron as indicated   Interim History:  Tina Molina is here today for follow-up. She is doing well but is feeling fatigued.  Hgb is improved at 10.5 (from 9.0), MCV 101 and platelets 209.  She sometimes has SOB with over exertion (walking long distances) and will take a break to rest when needed. , She denies having any episodes of bleeding. No bruising or petechiae.  No fever, chills, n/v, cough, rash, dizziness, chest pain, palpitations, abdominal pain or changes in bowel or bladder habits.  No tenderness, numbness or tingling in her extremities at this time.  She gets puffiness in her ankles that comes and goes. She takes lasix daily which helps reduce the fluid retention.  She states that she sees her nephrologist next week on Thursday for her routine follow-up.  No calls or syncopal episodes to report.  She is eating well and staying hydrated. Her weight is stable.   ECOG Performance Status: 1 - Symptomatic but completely ambulatory  Medications:  Allergies as of 03/08/2019      Reactions   Penicillins Other (See Comments)   Intolerance or allergy not remembered; her sister stated penicillin caused "boils under the skin"   Shellfish Allergy Rash   Shellfish-derived Products Anaphylaxis   Sulfonamide Derivatives    Arthralgias; her sister stated that this caused a rash.   Codeine Nausea Only   Nausea only with liquid codeine   Infliximab Other (See Comments)    Confusion (intolerance)   Azithromycin Itching      Medication List       Accurate as of March 08, 2019  1:48 PM. If you have any questions, ask your nurse or doctor.        aspirin 81 MG tablet Take 81 mg by mouth daily.   atorvastatin 20 MG  tablet Commonly known as: LIPITOR TAKE 1 TABLET(20 MG) BY MOUTH DAILY   carvedilol 25 MG tablet Commonly known as: COREG Take 1 tablet (25 mg total) by mouth 2 (two) times daily.   cyclobenzaprine 10 MG tablet Commonly known as: FLEXERIL Take 10 mg by mouth 3 (three) times daily as needed for muscle spasms.   famotidine 40 MG tablet Commonly known as: Pepcid Take 1 tablet (40 mg total) by mouth 2 (two) times daily.   furosemide 20 MG tablet Commonly known as: LASIX Take 20 mg by mouth daily.   gabapentin 300 MG capsule Commonly known as: NEURONTIN TAKE 1 CAPSULE BY MOUTH EVERY 8 HOURS AS NEEDED   hydrALAZINE 50 MG tablet Commonly known as: APRESOLINE TAKE 1 TABLET(50 MG) BY MOUTH THREE TIMES DAILY   leflunomide 20 MG tablet Commonly known as: ARAVA Take 20 mg by mouth daily.   levothyroxine 175 MCG tablet Commonly known as: SYNTHROID Take 1 tablet (175 mcg total) by mouth daily before breakfast.   Lumigan 0.01 % Soln Generic drug: bimatoprost INSTILL 1 DROP INTO BOTH EYES ONCE DAILY AT BEDTIME   MELATONIN GUMMIES PO Take by mouth at bedtime as needed.   multivitamin with minerals Tabs tablet Take 1 tablet by mouth daily.   spironolactone 50 MG tablet Commonly known as: ALDACTONE Take 1/2 tablet (25 MCG) by mouth daily.   Symbicort 160-4.5 MCG/ACT  inhaler Generic drug: budesonide-formoterol inhale 2 puffs if needed for wheezing   telmisartan 80 MG tablet Commonly known as: MICARDIS TAKE 1 TABLET(80 MG) BY MOUTH DAILY   vitamin E 400 UNIT capsule Take 400 Units by mouth daily.       Allergies:  Allergies  Allergen Reactions  . Penicillins Other (See Comments)    Intolerance or allergy not remembered; her sister stated penicillin caused "boils under the skin"  . Shellfish Allergy Rash  . Shellfish-Derived Products Anaphylaxis  . Sulfonamide Derivatives     Arthralgias; her sister stated that this caused a rash.  . Codeine Nausea Only    Nausea  only with liquid codeine  . Infliximab Other (See Comments)     Confusion (intolerance)  . Azithromycin Itching    Past Medical History, Surgical history, Social history, and Family History were reviewed and updated.  Review of Systems: All other 10 point review of systems is negative.   Physical Exam:  vitals were not taken for this visit.   Wt Readings from Last 3 Encounters:  02/12/19 206 lb 12.8 oz (93.8 kg)  02/08/19 207 lb (93.9 kg)  07/17/18 207 lb (93.9 kg)    Ocular: Sclerae unicteric, pupils equal, round and reactive to light Ear-nose-throat: Oropharynx clear, dentition fair Lymphatic: No cervical or supraclavicular adenopathy Lungs no rales or rhonchi, good excursion bilaterally Heart regular rate and rhythm, no murmur appreciated Abd soft, nontender, positive bowel sounds, no liver or spleen tip palpated on exam, no fluid wave  MSK no focal spinal tenderness, no joint edema Neuro: non-focal, well-oriented, appropriate affect Breasts: Deferred   Lab Results  Component Value Date   WBC 3.5 (L) 03/08/2019   HGB 10.5 (L) 03/08/2019   HCT 33.1 (L) 03/08/2019   MCV 101.8 (H) 03/08/2019   PLT 209 03/08/2019   Lab Results  Component Value Date   FERRITIN 123 02/08/2019   IRON 79 02/08/2019   TIBC 291 02/08/2019   UIBC 212 02/08/2019   IRONPCTSAT 27 02/08/2019   Lab Results  Component Value Date   RETICCTPCT 1.3 03/08/2019   RBC 3.25 (L) 03/08/2019   RBC 3.31 (L) 03/08/2019   RETICCTABS 51.6 01/03/2013   Lab Results  Component Value Date   KPAFRELGTCHN 18.6 06/01/2018   LAMBDASER 30.0 (H) 06/01/2018   KAPLAMBRATIO 0.62 06/01/2018   Lab Results  Component Value Date   IGGSERUM 902 06/01/2018   IGA 513 (H) 06/01/2018   IGMSERUM 26 06/01/2018   Lab Results  Component Value Date   TOTALPROTELP 6.6 06/01/2018   ALBUMINELP 4.2 06/01/2018   A1GS 0.2 06/01/2018   A2GS 0.5 06/01/2018   BETS 1.1 06/01/2018   BETA2SER 10.3 (H) 11/04/2011   GAMS 0.7  06/01/2018   MSPIKE 0.2 (H) 06/01/2018   SPEI Comment 06/01/2018     Chemistry      Component Value Date/Time   NA 143 02/12/2019 1130   NA 150 (H) 03/01/2017 1308   NA 143 04/05/2016 1156   K 4.7 02/12/2019 1130   K 4.0 03/01/2017 1308   K 3.8 04/05/2016 1156   CL 107 02/12/2019 1130   CL 109 (H) 03/01/2017 1308   CO2 28 02/12/2019 1130   CO2 28 03/01/2017 1308   CO2 21 (L) 04/05/2016 1156   BUN 42 (H) 02/12/2019 1130   BUN 19 03/01/2017 1308   BUN 18.5 04/05/2016 1156   CREATININE 3.23 (H) 02/12/2019 1130   CREATININE 2.83 (H) 02/08/2019 1437   CREATININE 1.8 (H)  03/01/2017 1308   CREATININE 1.5 (H) 04/05/2016 1156      Component Value Date/Time   CALCIUM 9.4 02/12/2019 1130   CALCIUM 9.7 03/01/2017 1308   CALCIUM 9.3 04/05/2016 1156   ALKPHOS 55 02/12/2019 1130   ALKPHOS 55 03/01/2017 1308   ALKPHOS 77 04/05/2016 1156   AST 13 02/12/2019 1130   AST 13 (L) 02/08/2019 1437   AST 21 04/05/2016 1156   ALT 9 02/12/2019 1130   ALT 8 02/08/2019 1437   ALT 18 03/01/2017 1308   ALT 22 04/05/2016 1156   BILITOT 0.3 02/12/2019 1130   BILITOT 0.3 02/08/2019 1437   BILITOT 0.36 04/05/2016 1156       Impression and Plan: Ms. Nickle is a very pleasant 73 yo African American female with multifactorial anemia as well as an IgA lambda MGUS. She continues to do well but does have episodes of fatigue.  We will proceed with Aranesp today for Hgb 10.5.  We will plan to see her back in another 6 weeks.  She will contact our office with any questions or concerns. We can certainly see him sooner if needed.   Laverna Peace, NP 10/22/20201:48 PM

## 2019-03-09 LAB — IRON AND TIBC
Iron: 113 ug/dL (ref 41–142)
Saturation Ratios: 34 % (ref 21–57)
TIBC: 331 ug/dL (ref 236–444)
UIBC: 218 ug/dL (ref 120–384)

## 2019-03-09 LAB — FERRITIN: Ferritin: 88 ng/mL (ref 11–307)

## 2019-03-10 ENCOUNTER — Other Ambulatory Visit: Payer: Self-pay | Admitting: Internal Medicine

## 2019-03-15 DIAGNOSIS — D472 Monoclonal gammopathy: Secondary | ICD-10-CM | POA: Diagnosis not present

## 2019-03-15 DIAGNOSIS — D860 Sarcoidosis of lung: Secondary | ICD-10-CM | POA: Diagnosis not present

## 2019-03-15 DIAGNOSIS — M069 Rheumatoid arthritis, unspecified: Secondary | ICD-10-CM | POA: Diagnosis not present

## 2019-03-15 DIAGNOSIS — D631 Anemia in chronic kidney disease: Secondary | ICD-10-CM | POA: Diagnosis not present

## 2019-03-15 DIAGNOSIS — N1832 Chronic kidney disease, stage 3b: Secondary | ICD-10-CM | POA: Diagnosis not present

## 2019-03-23 ENCOUNTER — Other Ambulatory Visit (INDEPENDENT_AMBULATORY_CARE_PROVIDER_SITE_OTHER): Payer: Medicare Other

## 2019-03-23 ENCOUNTER — Other Ambulatory Visit: Payer: Self-pay | Admitting: *Deleted

## 2019-03-23 ENCOUNTER — Other Ambulatory Visit: Payer: Self-pay | Admitting: Internal Medicine

## 2019-03-23 ENCOUNTER — Other Ambulatory Visit: Payer: Self-pay

## 2019-03-23 DIAGNOSIS — E890 Postprocedural hypothyroidism: Secondary | ICD-10-CM

## 2019-03-23 LAB — TSH: TSH: 5.64 u[IU]/mL — ABNORMAL HIGH (ref 0.35–4.50)

## 2019-03-23 MED ORDER — LEVOTHYROXINE SODIUM 200 MCG PO TABS: 200.0000 ug | ORAL_TABLET | Freq: Every day | ORAL | 1 refills | Status: DC

## 2019-03-29 ENCOUNTER — Other Ambulatory Visit: Payer: Self-pay | Admitting: Internal Medicine

## 2019-03-29 DIAGNOSIS — I10 Essential (primary) hypertension: Secondary | ICD-10-CM

## 2019-03-29 DIAGNOSIS — D631 Anemia in chronic kidney disease: Secondary | ICD-10-CM | POA: Diagnosis not present

## 2019-03-29 DIAGNOSIS — N1832 Chronic kidney disease, stage 3b: Secondary | ICD-10-CM | POA: Diagnosis not present

## 2019-03-29 DIAGNOSIS — I129 Hypertensive chronic kidney disease with stage 1 through stage 4 chronic kidney disease, or unspecified chronic kidney disease: Secondary | ICD-10-CM | POA: Diagnosis not present

## 2019-03-29 DIAGNOSIS — D472 Monoclonal gammopathy: Secondary | ICD-10-CM | POA: Diagnosis not present

## 2019-03-29 DIAGNOSIS — M069 Rheumatoid arthritis, unspecified: Secondary | ICD-10-CM | POA: Diagnosis not present

## 2019-04-05 ENCOUNTER — Inpatient Hospital Stay: Payer: Medicare Other | Attending: Family | Admitting: Family

## 2019-04-05 ENCOUNTER — Other Ambulatory Visit: Payer: Self-pay

## 2019-04-05 ENCOUNTER — Other Ambulatory Visit: Payer: Self-pay | Admitting: Internal Medicine

## 2019-04-05 ENCOUNTER — Inpatient Hospital Stay: Payer: Medicare Other

## 2019-04-05 ENCOUNTER — Telehealth: Payer: Self-pay | Admitting: Family

## 2019-04-05 VITALS — BP 117/58 | HR 86 | Temp 96.0°F | Wt 199.5 lb

## 2019-04-05 DIAGNOSIS — D508 Other iron deficiency anemias: Secondary | ICD-10-CM

## 2019-04-05 DIAGNOSIS — N182 Chronic kidney disease, stage 2 (mild): Secondary | ICD-10-CM | POA: Diagnosis not present

## 2019-04-05 DIAGNOSIS — D509 Iron deficiency anemia, unspecified: Secondary | ICD-10-CM | POA: Insufficient documentation

## 2019-04-05 DIAGNOSIS — I129 Hypertensive chronic kidney disease with stage 1 through stage 4 chronic kidney disease, or unspecified chronic kidney disease: Secondary | ICD-10-CM | POA: Insufficient documentation

## 2019-04-05 DIAGNOSIS — Z79899 Other long term (current) drug therapy: Secondary | ICD-10-CM | POA: Diagnosis not present

## 2019-04-05 DIAGNOSIS — N183 Chronic kidney disease, stage 3 unspecified: Secondary | ICD-10-CM | POA: Diagnosis not present

## 2019-04-05 DIAGNOSIS — Z7982 Long term (current) use of aspirin: Secondary | ICD-10-CM | POA: Diagnosis not present

## 2019-04-05 DIAGNOSIS — E1122 Type 2 diabetes mellitus with diabetic chronic kidney disease: Secondary | ICD-10-CM | POA: Insufficient documentation

## 2019-04-05 DIAGNOSIS — D472 Monoclonal gammopathy: Secondary | ICD-10-CM | POA: Diagnosis not present

## 2019-04-05 DIAGNOSIS — D631 Anemia in chronic kidney disease: Secondary | ICD-10-CM | POA: Insufficient documentation

## 2019-04-05 LAB — CMP (CANCER CENTER ONLY)
ALT: 15 U/L (ref 0–44)
AST: 18 U/L (ref 15–41)
Albumin: 4.4 g/dL (ref 3.5–5.0)
Alkaline Phosphatase: 55 U/L (ref 38–126)
Anion gap: 8 (ref 5–15)
BUN: 50 mg/dL — ABNORMAL HIGH (ref 8–23)
CO2: 26 mmol/L (ref 22–32)
Calcium: 9.3 mg/dL (ref 8.9–10.3)
Chloride: 108 mmol/L (ref 98–111)
Creatinine: 2.84 mg/dL — ABNORMAL HIGH (ref 0.44–1.00)
GFR, Est AFR Am: 18 mL/min — ABNORMAL LOW (ref >60)
GFR, Estimated: 16 mL/min — ABNORMAL LOW (ref >60)
Glucose, Bld: 116 mg/dL — ABNORMAL HIGH (ref 70–99)
Potassium: 4 mmol/L (ref 3.5–5.1)
Sodium: 142 mmol/L (ref 135–145)
Total Bilirubin: 0.4 mg/dL (ref 0.3–1.2)
Total Protein: 6.7 g/dL (ref 6.5–8.1)

## 2019-04-05 LAB — CBC WITH DIFFERENTIAL (CANCER CENTER ONLY)
Abs Immature Granulocytes: 0.01 10*3/uL (ref 0.00–0.07)
Basophils Absolute: 0 10*3/uL (ref 0.0–0.1)
Basophils Relative: 1 %
Eosinophils Absolute: 0.1 10*3/uL (ref 0.0–0.5)
Eosinophils Relative: 4 %
HCT: 35.9 % — ABNORMAL LOW (ref 36.0–46.0)
Hemoglobin: 11.2 g/dL — ABNORMAL LOW (ref 12.0–15.0)
Immature Granulocytes: 0 %
Lymphocytes Relative: 54 %
Lymphs Abs: 1.8 10*3/uL (ref 0.7–4.0)
MCH: 31.3 pg (ref 26.0–34.0)
MCHC: 31.2 g/dL (ref 30.0–36.0)
MCV: 100.3 fL — ABNORMAL HIGH (ref 80.0–100.0)
Monocytes Absolute: 0.5 10*3/uL (ref 0.1–1.0)
Monocytes Relative: 14 %
Neutro Abs: 0.9 10*3/uL — ABNORMAL LOW (ref 1.7–7.7)
Neutrophils Relative %: 27 %
Platelet Count: 184 10*3/uL (ref 150–400)
RBC: 3.58 MIL/uL — ABNORMAL LOW (ref 3.87–5.11)
RDW: 14 % (ref 11.5–15.5)
WBC Count: 3.4 10*3/uL — ABNORMAL LOW (ref 4.0–10.5)
nRBC: 0 % (ref 0.0–0.2)

## 2019-04-05 LAB — RETICULOCYTES
Immature Retic Fract: 6.6 % (ref 2.3–15.9)
RBC.: 3.55 MIL/uL — ABNORMAL LOW (ref 3.87–5.11)
Retic Count, Absolute: 27.3 10*3/uL (ref 19.0–186.0)
Retic Ct Pct: 0.8 % (ref 0.4–3.1)

## 2019-04-05 MED ORDER — DARBEPOETIN ALFA 300 MCG/0.6ML IJ SOSY
PREFILLED_SYRINGE | INTRAMUSCULAR | Status: AC
  Filled 2019-04-05: qty 0.6

## 2019-04-05 NOTE — Telephone Encounter (Signed)
Appointments scheduled letter/calendar mailed per 11/19 los

## 2019-04-05 NOTE — Progress Notes (Signed)
Hematology and Oncology Follow Up Visit  Tina Molina [id number] 01/03/1946 73 y.o. 04/05/2019   Principle Diagnosis:  Anemia of chronic renal failure - stage II MGUS - IgA lambda Iron deficiency anemia  Current Therapy:   Aranesp 300 mcg SQ to keep Hgb > 11 IV iron as indicated   Interim History:  Tina Molina is here today for follow-up. She is doing well but still has fatigue.  Hgb is stable at 11.2. She has not noted any blood loss. No bruising or petechiae.  She will sometimes note SOB with walking. She will take a break to rest as needed.  No fever, chills, n/v, cough, rash, dizziness, chest pain, palpitations, abdominal pain or changes bowel or bladder habits.  No tenderness in her extremities at this time.  She has numbness and tingling in her toes since a previous lower back surgery.  She has occasional swelling in her legs. This comes and goes.  No falls or syncope.  She has a good appetite and is staying well hydrated. Her weight is stable.   ECOG Performance Status: 1 - Symptomatic but completely ambulatory  Medications:  Allergies as of 04/05/2019      Reactions   Penicillins Other (See Comments)   Intolerance or allergy not remembered; her sister stated penicillin caused "boils under the skin"   Shellfish Allergy Rash   Shellfish-derived Products Anaphylaxis   Sulfonamide Derivatives    Arthralgias; her sister stated that this caused a rash.   Codeine Nausea Only   Nausea only with liquid codeine   Infliximab Other (See Comments)    Confusion (intolerance)   Azithromycin Itching      Medication List       Accurate as of April 05, 2019  1:23 PM. If you have any questions, ask your nurse or doctor.        aspirin 81 MG tablet Take 81 mg by mouth daily.   atorvastatin 20 MG tablet Commonly known as: LIPITOR TAKE 1 TABLET(20 MG) BY MOUTH DAILY   carvedilol 25 MG tablet Commonly known as: COREG Take 1 tablet (25 mg total) by mouth 2 (two) times  daily.   cyclobenzaprine 10 MG tablet Commonly known as: FLEXERIL Take 10 mg by mouth 3 (three) times daily as needed for muscle spasms.   famotidine 40 MG tablet Commonly known as: PEPCID TAKE 1 TABLET(40 MG) BY MOUTH TWICE DAILY   furosemide 20 MG tablet Commonly known as: LASIX Take 20 mg by mouth daily.   gabapentin 300 MG capsule Commonly known as: NEURONTIN TAKE 1 CAPSULE BY MOUTH EVERY 8 HOURS AS NEEDED   hydrALAZINE 50 MG tablet Commonly known as: APRESOLINE TAKE 1 TABLET(50 MG) BY MOUTH THREE TIMES DAILY   leflunomide 20 MG tablet Commonly known as: ARAVA Take 20 mg by mouth daily.   levothyroxine 200 MCG tablet Commonly known as: SYNTHROID TAKE 1 TABLET(200 MCG) BY MOUTH DAILY BEFORE BREAKFAST   Lumigan 0.01 % Soln Generic drug: bimatoprost INSTILL 1 DROP INTO BOTH EYES ONCE DAILY AT BEDTIME   MELATONIN GUMMIES PO Take by mouth at bedtime as needed.   multivitamin with minerals Tabs tablet Take 1 tablet by mouth daily.   spironolactone 50 MG tablet Commonly known as: ALDACTONE Take 1/2 tablet (25 MCG) by mouth daily.   Symbicort 160-4.5 MCG/ACT inhaler Generic drug: budesonide-formoterol inhale 2 puffs if needed for wheezing   telmisartan 80 MG tablet Commonly known as: MICARDIS TAKE 1 TABLET(80 MG) BY MOUTH DAILY   vitamin  E 400 UNIT capsule Take 400 Units by mouth daily.       Allergies:  Allergies  Allergen Reactions  . Penicillins Other (See Comments)    Intolerance or allergy not remembered; her sister stated penicillin caused "boils under the skin"  . Shellfish Allergy Rash  . Shellfish-Derived Products Anaphylaxis  . Sulfonamide Derivatives     Arthralgias; her sister stated that this caused a rash.  . Codeine Nausea Only    Nausea only with liquid codeine  . Infliximab Other (See Comments)     Confusion (intolerance)  . Azithromycin Itching    Past Medical History, Surgical history, Social history, and Family History were  reviewed and updated.  Review of Systems: All other 10 point review of systems is negative.   Physical Exam:  weight is 199 lb 8 oz (90.5 kg). Her tympanic temperature is 96 F (35.6 C) (abnormal). Her blood pressure is 117/58 (abnormal) and her pulse is 86. Her oxygen saturation is 97%.   Wt Readings from Last 3 Encounters:  04/05/19 199 lb 8 oz (90.5 kg)  03/08/19 201 lb (91.2 kg)  02/12/19 206 lb 12.8 oz (93.8 kg)    Ocular: Sclerae unicteric, pupils equal, round and reactive to light Ear-nose-throat: Oropharynx clear, dentition fair Lymphatic: No cervical or supraclavicular adenopathy Lungs no rales or rhonchi, good excursion bilaterally Heart regular rate and rhythm, no murmur appreciated Abd soft, nontender, positive bowel sounds, no liver or spleen tip palpated on exam, no fluid wave  MSK no focal spinal tenderness, no joint edema Neuro: non-focal, well-oriented, appropriate affect Breasts: Deferred   Lab Results  Component Value Date   WBC 3.4 (L) 04/05/2019   HGB 11.2 (L) 04/05/2019   HCT 35.9 (L) 04/05/2019   MCV 100.3 (H) 04/05/2019   PLT 184 04/05/2019   Lab Results  Component Value Date   FERRITIN 88 03/08/2019   IRON 113 03/08/2019   TIBC 331 03/08/2019   UIBC 218 03/08/2019   IRONPCTSAT 34 03/08/2019   Lab Results  Component Value Date   RETICCTPCT 0.8 04/05/2019   RBC 3.55 (L) 04/05/2019   RETICCTABS 51.6 01/03/2013   Lab Results  Component Value Date   KPAFRELGTCHN 18.6 06/01/2018   LAMBDASER 30.0 (H) 06/01/2018   KAPLAMBRATIO 0.62 06/01/2018   Lab Results  Component Value Date   IGGSERUM 902 06/01/2018   IGA 513 (H) 06/01/2018   IGMSERUM 26 06/01/2018   Lab Results  Component Value Date   TOTALPROTELP 6.6 06/01/2018   ALBUMINELP 4.2 06/01/2018   A1GS 0.2 06/01/2018   A2GS 0.5 06/01/2018   BETS 1.1 06/01/2018   BETA2SER 10.3 (H) 11/04/2011   GAMS 0.7 06/01/2018   MSPIKE 0.2 (H) 06/01/2018   SPEI Comment 06/01/2018     Chemistry       Component Value Date/Time   NA 143 03/08/2019 1321   NA 150 (H) 03/01/2017 1308   NA 143 04/05/2016 1156   K 4.7 03/08/2019 1321   K 4.0 03/01/2017 1308   K 3.8 04/05/2016 1156   CL 108 03/08/2019 1321   CL 109 (H) 03/01/2017 1308   CO2 25 03/08/2019 1321   CO2 28 03/01/2017 1308   CO2 21 (L) 04/05/2016 1156   BUN 52 (H) 03/08/2019 1321   BUN 19 03/01/2017 1308   BUN 18.5 04/05/2016 1156   CREATININE 2.78 (H) 03/08/2019 1321   CREATININE 1.8 (H) 03/01/2017 1308   CREATININE 1.5 (H) 04/05/2016 1156      Component Value  Date/Time   CALCIUM 9.9 03/08/2019 1321   CALCIUM 9.7 03/01/2017 1308   CALCIUM 9.3 04/05/2016 1156   ALKPHOS 53 03/08/2019 1321   ALKPHOS 55 03/01/2017 1308   ALKPHOS 77 04/05/2016 1156   AST 15 03/08/2019 1321   AST 21 04/05/2016 1156   ALT 9 03/08/2019 1321   ALT 18 03/01/2017 1308   ALT 22 04/05/2016 1156   BILITOT 0.4 03/08/2019 1321   BILITOT 0.36 04/05/2016 1156       Impression and Plan: Ms. Seay is a very pleasant 89 yoAfrican American female with multifactorial anemia as well as an IgA lambda MGUS. We will see what her iron studies show and replace if needed.  No Aranesp needed this visit, Hgb 11.2.  We will plan to see her back in another 2 months.  She will contact our office with any questions or concerns. We can certainly see her sooner if needed.   Laverna Peace, NP 11/19/20201:24 PM

## 2019-04-06 LAB — IGG, IGA, IGM
IgA: 597 mg/dL — ABNORMAL HIGH (ref 64–422)
IgG (Immunoglobin G), Serum: 1021 mg/dL (ref 586–1602)
IgM (Immunoglobulin M), Srm: 26 mg/dL (ref 26–217)

## 2019-04-06 LAB — KAPPA/LAMBDA LIGHT CHAINS
Kappa free light chain: 35.8 mg/L — ABNORMAL HIGH (ref 3.3–19.4)
Kappa, lambda light chain ratio: 0.67 (ref 0.26–1.65)
Lambda free light chains: 53.4 mg/L — ABNORMAL HIGH (ref 5.7–26.3)

## 2019-04-06 LAB — IRON AND TIBC
Iron: 107 ug/dL (ref 41–142)
Saturation Ratios: 37 % (ref 21–57)
TIBC: 285 ug/dL (ref 236–444)
UIBC: 178 ug/dL (ref 120–384)

## 2019-04-06 LAB — FERRITIN: Ferritin: 99 ng/mL (ref 11–307)

## 2019-04-09 LAB — PROTEIN ELECTROPHORESIS, SERUM
A/G Ratio: 1.3 (ref 0.7–1.7)
Albumin ELP: 3.9 g/dL (ref 2.9–4.4)
Alpha-1-Globulin: 0.2 g/dL (ref 0.0–0.4)
Alpha-2-Globulin: 0.6 g/dL (ref 0.4–1.0)
Beta Globulin: 1.3 g/dL (ref 0.7–1.3)
Gamma Globulin: 0.9 g/dL (ref 0.4–1.8)
Globulin, Total: 3 g/dL (ref 2.2–3.9)
Total Protein ELP: 6.9 g/dL (ref 6.0–8.5)

## 2019-06-05 ENCOUNTER — Ambulatory Visit: Payer: Medicare Other

## 2019-06-05 ENCOUNTER — Ambulatory Visit: Payer: Medicare Other | Admitting: Family

## 2019-06-05 ENCOUNTER — Other Ambulatory Visit: Payer: Medicare Other

## 2019-06-18 ENCOUNTER — Other Ambulatory Visit: Payer: Self-pay | Admitting: Internal Medicine

## 2019-06-18 DIAGNOSIS — E890 Postprocedural hypothyroidism: Secondary | ICD-10-CM

## 2019-06-25 DIAGNOSIS — Z20828 Contact with and (suspected) exposure to other viral communicable diseases: Secondary | ICD-10-CM | POA: Diagnosis not present

## 2019-06-26 ENCOUNTER — Other Ambulatory Visit: Payer: Self-pay | Admitting: Internal Medicine

## 2019-06-26 DIAGNOSIS — I10 Essential (primary) hypertension: Secondary | ICD-10-CM

## 2019-06-28 ENCOUNTER — Other Ambulatory Visit: Payer: Self-pay | Admitting: Internal Medicine

## 2019-07-12 DIAGNOSIS — M069 Rheumatoid arthritis, unspecified: Secondary | ICD-10-CM | POA: Diagnosis not present

## 2019-07-12 DIAGNOSIS — D472 Monoclonal gammopathy: Secondary | ICD-10-CM | POA: Diagnosis not present

## 2019-07-12 DIAGNOSIS — D631 Anemia in chronic kidney disease: Secondary | ICD-10-CM | POA: Diagnosis not present

## 2019-07-12 DIAGNOSIS — I129 Hypertensive chronic kidney disease with stage 1 through stage 4 chronic kidney disease, or unspecified chronic kidney disease: Secondary | ICD-10-CM | POA: Diagnosis not present

## 2019-07-12 DIAGNOSIS — N1832 Chronic kidney disease, stage 3b: Secondary | ICD-10-CM | POA: Diagnosis not present

## 2019-07-17 ENCOUNTER — Telehealth: Payer: Self-pay | Admitting: *Deleted

## 2019-07-17 NOTE — Telephone Encounter (Signed)
Call received from patient stating that her hgb was 7.6 at Dr. Bishop [place] office last week and would like to come in for an aranesp injection.  Instructed pt to have Dr. Bishop [place] office fax lab results to [phone].  Message sent to scheduling.

## 2019-07-18 ENCOUNTER — Telehealth: Payer: Self-pay | Admitting: Hematology & Oncology

## 2019-07-18 NOTE — Telephone Encounter (Signed)
lvm to inform patient of lab.ov.inj appt 3/5 at 115 pm per 3/2 sch msg

## 2019-07-19 ENCOUNTER — Telehealth: Payer: Self-pay

## 2019-07-20 ENCOUNTER — Inpatient Hospital Stay: Payer: Medicare Other | Attending: Hematology & Oncology

## 2019-07-20 ENCOUNTER — Other Ambulatory Visit: Payer: Self-pay

## 2019-07-20 ENCOUNTER — Encounter: Payer: Self-pay | Admitting: Family

## 2019-07-20 ENCOUNTER — Inpatient Hospital Stay: Payer: Medicare Other

## 2019-07-20 ENCOUNTER — Inpatient Hospital Stay (HOSPITAL_BASED_OUTPATIENT_CLINIC_OR_DEPARTMENT_OTHER): Payer: Medicare Other | Admitting: Family

## 2019-07-20 VITALS — BP 167/78 | HR 97 | Temp 96.2°F | Resp 18 | Ht 64.0 in | Wt 200.8 lb

## 2019-07-20 DIAGNOSIS — R5383 Other fatigue: Secondary | ICD-10-CM | POA: Diagnosis not present

## 2019-07-20 DIAGNOSIS — D509 Iron deficiency anemia, unspecified: Secondary | ICD-10-CM | POA: Insufficient documentation

## 2019-07-20 DIAGNOSIS — Z79899 Other long term (current) drug therapy: Secondary | ICD-10-CM | POA: Insufficient documentation

## 2019-07-20 DIAGNOSIS — N182 Chronic kidney disease, stage 2 (mild): Secondary | ICD-10-CM | POA: Diagnosis not present

## 2019-07-20 DIAGNOSIS — R05 Cough: Secondary | ICD-10-CM | POA: Insufficient documentation

## 2019-07-20 DIAGNOSIS — D508 Other iron deficiency anemias: Secondary | ICD-10-CM

## 2019-07-20 DIAGNOSIS — R002 Palpitations: Secondary | ICD-10-CM | POA: Diagnosis not present

## 2019-07-20 DIAGNOSIS — D631 Anemia in chronic kidney disease: Secondary | ICD-10-CM | POA: Insufficient documentation

## 2019-07-20 DIAGNOSIS — R200 Anesthesia of skin: Secondary | ICD-10-CM | POA: Insufficient documentation

## 2019-07-20 DIAGNOSIS — R531 Weakness: Secondary | ICD-10-CM | POA: Insufficient documentation

## 2019-07-20 DIAGNOSIS — R0602 Shortness of breath: Secondary | ICD-10-CM | POA: Insufficient documentation

## 2019-07-20 DIAGNOSIS — D472 Monoclonal gammopathy: Secondary | ICD-10-CM

## 2019-07-20 DIAGNOSIS — R202 Paresthesia of skin: Secondary | ICD-10-CM | POA: Insufficient documentation

## 2019-07-20 LAB — CBC WITH DIFFERENTIAL (CANCER CENTER ONLY)
Abs Immature Granulocytes: 0.01 10*3/uL (ref 0.00–0.07)
Basophils Absolute: 0 10*3/uL (ref 0.0–0.1)
Basophils Relative: 1 %
Eosinophils Absolute: 0.1 10*3/uL (ref 0.0–0.5)
Eosinophils Relative: 3 %
HCT: 25.6 % — ABNORMAL LOW (ref 36.0–46.0)
Hemoglobin: 7.8 g/dL — ABNORMAL LOW (ref 12.0–15.0)
Immature Granulocytes: 0 %
Lymphocytes Relative: 47 %
Lymphs Abs: 1.6 10*3/uL (ref 0.7–4.0)
MCH: 32.1 pg (ref 26.0–34.0)
MCHC: 30.5 g/dL (ref 30.0–36.0)
MCV: 105.3 fL — ABNORMAL HIGH (ref 80.0–100.0)
Monocytes Absolute: 0.6 10*3/uL (ref 0.1–1.0)
Monocytes Relative: 16 %
Neutro Abs: 1.2 10*3/uL — ABNORMAL LOW (ref 1.7–7.7)
Neutrophils Relative %: 33 %
Platelet Count: 204 10*3/uL (ref 150–400)
RBC: 2.43 MIL/uL — ABNORMAL LOW (ref 3.87–5.11)
RDW: 14.9 % (ref 11.5–15.5)
WBC Count: 3.5 10*3/uL — ABNORMAL LOW (ref 4.0–10.5)
nRBC: 0 % (ref 0.0–0.2)

## 2019-07-20 LAB — CMP (CANCER CENTER ONLY)
ALT: 14 U/L (ref 0–44)
AST: 15 U/L (ref 15–41)
Albumin: 4.3 g/dL (ref 3.5–5.0)
Alkaline Phosphatase: 48 U/L (ref 38–126)
Anion gap: 10 (ref 5–15)
BUN: 40 mg/dL — ABNORMAL HIGH (ref 8–23)
CO2: 24 mmol/L (ref 22–32)
Calcium: 9.2 mg/dL (ref 8.9–10.3)
Chloride: 108 mmol/L (ref 98–111)
Creatinine: 2.03 mg/dL — ABNORMAL HIGH (ref 0.44–1.00)
GFR, Est AFR Am: 27 mL/min — ABNORMAL LOW (ref >60)
GFR, Estimated: 24 mL/min — ABNORMAL LOW (ref >60)
Glucose, Bld: 113 mg/dL — ABNORMAL HIGH (ref 70–99)
Potassium: 4.1 mmol/L (ref 3.5–5.1)
Sodium: 142 mmol/L (ref 135–145)
Total Bilirubin: 0.3 mg/dL (ref 0.3–1.2)
Total Protein: 7.3 g/dL (ref 6.5–8.1)

## 2019-07-20 LAB — RETICULOCYTES
Immature Retic Fract: 16.1 % — ABNORMAL HIGH (ref 2.3–15.9)
RBC.: 2.4 MIL/uL — ABNORMAL LOW (ref 3.87–5.11)
Retic Count, Absolute: 66 10*3/uL (ref 19.0–186.0)
Retic Ct Pct: 2.8 % (ref 0.4–3.1)

## 2019-07-20 MED ORDER — DARBEPOETIN ALFA 300 MCG/0.6ML IJ SOSY
300.0000 ug | PREFILLED_SYRINGE | Freq: Once | INTRAMUSCULAR | Status: AC
  Administered 2019-07-20: 300 ug via SUBCUTANEOUS

## 2019-07-20 MED ORDER — DARBEPOETIN ALFA 300 MCG/0.6ML IJ SOSY
PREFILLED_SYRINGE | INTRAMUSCULAR | Status: AC
  Filled 2019-07-20: qty 0.6

## 2019-07-20 NOTE — Progress Notes (Signed)
Hematology and Oncology Follow Up Visit  Tina Molina [id number] September 06, 1945 74 y.o. 07/20/2019   Principle Diagnosis:  Anemia of chronic renal failure - stage II MGUS - IgA lambda Iron deficiency anemia  Current Therapy:   Aranesp 300 mcg SQ to keep Hgb > 11 IV iron as indicated   Interim History:  Tina Molina is here today for follow-up and Aranesp. We have not seen her since November of last year. She states that she had not felt well and did not follow-up.  She has felt fatigued/weak and between 1/20-2/14 had a cough and just "felt terrible". Hgb today is down to 7.8, MCV 105, platelets 204.  She has not noted any abnormal blood loss or bruising, no petechiae.  She has occasional bleeding along the gum line when brushing her teeth.  No fever, chills, n/v, cough, rash, dizziness, chest pain, palpitations, abdominal pain or changes in bowel or bladder habits.  No swelling or tenderness in her extremities right now. She has occasional puffiness in her feet.  She has numbness and tingling in her toes and fingertips since a previous back surgery.  No falls or syncopal episodes to report.  She has maintained a good appetite and is staying well hydrated. Her weight is stable.   ECOG Performance Status: 1 - Symptomatic but completely ambulatory  Medications:  Allergies as of 07/20/2019      Reactions   Penicillins Other (See Comments)   Intolerance or allergy not remembered; her sister stated penicillin caused "boils under the skin"   Shellfish Allergy Rash   Shellfish-derived Products Anaphylaxis   Sulfonamide Derivatives    Arthralgias; her sister stated that this caused a rash.   Codeine Nausea Only   Nausea only with liquid codeine   Infliximab Other (See Comments)    Confusion (intolerance)   Azithromycin Itching      Medication List       Accurate as of July 20, 2019  1:29 PM. If you have any questions, ask your nurse or doctor.        aspirin 81 MG tablet Take 81  mg by mouth daily.   atorvastatin 20 MG tablet Commonly known as: LIPITOR TAKE 1 TABLET(20 MG) BY MOUTH DAILY   carvedilol 25 MG tablet Commonly known as: COREG Take 1 tablet (25 mg total) by mouth 2 (two) times daily.   cyclobenzaprine 10 MG tablet Commonly known as: FLEXERIL Take 10 mg by mouth 3 (three) times daily as needed for muscle spasms.   famotidine 40 MG tablet Commonly known as: PEPCID TAKE 1 TABLET(40 MG) BY MOUTH TWICE DAILY   furosemide 20 MG tablet Commonly known as: LASIX Take 20 mg by mouth daily.   gabapentin 300 MG capsule Commonly known as: NEURONTIN TAKE 1 CAPSULE BY MOUTH EVERY 8 HOURS AS NEEDED   hydrALAZINE 50 MG tablet Commonly known as: APRESOLINE TAKE 1 TABLET(50 MG) BY MOUTH THREE TIMES DAILY   leflunomide 20 MG tablet Commonly known as: ARAVA Take 20 mg by mouth daily.   levothyroxine 200 MCG tablet Commonly known as: SYNTHROID TAKE 1 TABLET(200 MCG) BY MOUTH DAILY BEFORE BREAKFAST   Lumigan 0.01 % Soln Generic drug: bimatoprost INSTILL 1 DROP INTO BOTH EYES ONCE DAILY AT BEDTIME   MELATONIN GUMMIES PO Take by mouth at bedtime as needed.   multivitamin with minerals Tabs tablet Take 1 tablet by mouth daily.   spironolactone 50 MG tablet Commonly known as: ALDACTONE Take 1/2 tablet (25 MCG) by mouth daily.  Symbicort 160-4.5 MCG/ACT inhaler Generic drug: budesonide-formoterol inhale 2 puffs if needed for wheezing   telmisartan 40 MG tablet Commonly known as: MICARDIS Take 40 mg by mouth daily.   vitamin E 180 MG (400 UNITS) capsule Take 400 Units by mouth daily.       Allergies:  Allergies  Allergen Reactions  . Penicillins Other (See Comments)    Intolerance or allergy not remembered; her sister stated penicillin caused "boils under the skin"  . Shellfish Allergy Rash  . Shellfish-Derived Products Anaphylaxis  . Sulfonamide Derivatives     Arthralgias; her sister stated that this caused a rash.  . Codeine Nausea  Only    Nausea only with liquid codeine  . Infliximab Other (See Comments)     Confusion (intolerance)  . Azithromycin Itching    Past Medical History, Surgical history, Social history, and Family History were reviewed and updated.  Review of Systems: All other 10 point review of systems is negative.   Physical Exam:  vitals were not taken for this visit.   Wt Readings from Last 3 Encounters:  04/05/19 199 lb 8 oz (90.5 kg)  03/08/19 201 lb (91.2 kg)  02/12/19 206 lb 12.8 oz (93.8 kg)    Ocular: Sclerae unicteric, pupils equal, round and reactive to light Ear-nose-throat: Oropharynx clear, dentition fair Lymphatic: No cervical or supraclavicular adenopathy Lungs no rales or rhonchi, good excursion bilaterally Heart regular rate and rhythm, no murmur appreciated Abd soft, nontender, positive bowel sounds, no liver or spleen tip palpated on exam, no fluid wave  MSK no focal spinal tenderness, no joint edema Neuro: non-focal, well-oriented, appropriate affect Breasts: Deferred   Lab Results  Component Value Date   WBC 3.4 (L) 04/05/2019   HGB 11.2 (L) 04/05/2019   HCT 35.9 (L) 04/05/2019   MCV 100.3 (H) 04/05/2019   PLT 184 04/05/2019   Lab Results  Component Value Date   FERRITIN 99 04/05/2019   IRON 107 04/05/2019   TIBC 285 04/05/2019   UIBC 178 04/05/2019   IRONPCTSAT 37 04/05/2019   Lab Results  Component Value Date   RETICCTPCT 0.8 04/05/2019   RBC 3.55 (L) 04/05/2019   RETICCTABS 51.6 01/03/2013   Lab Results  Component Value Date   KPAFRELGTCHN 35.8 (H) 04/05/2019   LAMBDASER 53.4 (H) 04/05/2019   KAPLAMBRATIO 0.67 04/05/2019   Lab Results  Component Value Date   IGGSERUM 1,021 04/05/2019   IGA 597 (H) 04/05/2019   IGMSERUM 26 04/05/2019   Lab Results  Component Value Date   TOTALPROTELP 6.9 04/05/2019   ALBUMINELP 3.9 04/05/2019   A1GS 0.2 04/05/2019   A2GS 0.6 04/05/2019   BETS 1.3 04/05/2019   BETA2SER 10.3 (H) 11/04/2011   GAMS 0.9  04/05/2019   MSPIKE Not Observed 04/05/2019   SPEI Comment 04/05/2019     Chemistry      Component Value Date/Time   NA 142 04/05/2019 1257   NA 150 (H) 03/01/2017 1308   NA 143 04/05/2016 1156   K 4.0 04/05/2019 1257   K 4.0 03/01/2017 1308   K 3.8 04/05/2016 1156   CL 108 04/05/2019 1257   CL 109 (H) 03/01/2017 1308   CO2 26 04/05/2019 1257   CO2 28 03/01/2017 1308   CO2 21 (L) 04/05/2016 1156   BUN 50 (H) 04/05/2019 1257   BUN 19 03/01/2017 1308   BUN 18.5 04/05/2016 1156   CREATININE 2.84 (H) 04/05/2019 1257   CREATININE 1.8 (H) 03/01/2017 1308   CREATININE 1.5 (  H) 04/05/2016 1156      Component Value Date/Time   CALCIUM 9.3 04/05/2019 1257   CALCIUM 9.7 03/01/2017 1308   CALCIUM 9.3 04/05/2016 1156   ALKPHOS 55 04/05/2019 1257   ALKPHOS 55 03/01/2017 1308   ALKPHOS 77 04/05/2016 1156   AST 18 04/05/2019 1257   AST 21 04/05/2016 1156   ALT 15 04/05/2019 1257   ALT 18 03/01/2017 1308   ALT 22 04/05/2016 1156   BILITOT 0.4 04/05/2019 1257   BILITOT 0.36 04/05/2016 1156       Impression and Plan: Tina Molina is a very pleasant 78 yoAfrican American female with multifactorial anemia as well as an IgA lambda MGUS. Aranesp given today for Hgb 7.8. Patient is fatigued but not in any distress at this time.  We will see what her iron studies look like and replace next week if needed.  We will have her come back in another 3 weeks for follow-up and repeat lab work.  She is in agreement and will contact our office with any questions or concerns. She will go to the ED in the event of an emergency.   Laverna Peace, NP 3/5/20211:29 PM

## 2019-07-20 NOTE — Patient Instructions (Signed)
Darbepoetin Alfa injection What is this medicine? DARBEPOETIN ALFA (dar be POE e tin AL fa) helps your body make more red blood cells. It is used to treat anemia caused by chronic kidney failure and chemotherapy. This medicine may be used for other purposes; ask your health care provider or pharmacist if you have questions. COMMON BRAND NAME(S): Aranesp What should I tell my health care provider before I take this medicine? They need to know if you have any of these conditions:  blood clotting disorders or history of blood clots  cancer patient not on chemotherapy  cystic fibrosis  heart disease, such as angina, heart failure, or a history of a heart attack  hemoglobin level of 12 g/dL or greater  high blood pressure  low levels of folate, iron, or vitamin B12  seizures  an unusual or allergic reaction to darbepoetin, erythropoietin, albumin, hamster proteins, latex, other medicines, foods, dyes, or preservatives  pregnant or trying to get pregnant  breast-feeding How should I use this medicine? This medicine is for injection into a vein or under the skin. It is usually given by a health care professional in a hospital or clinic setting. If you get this medicine at home, you will be taught how to prepare and give this medicine. Use exactly as directed. Take your medicine at regular intervals. Do not take your medicine more often than directed. It is important that you put your used needles and syringes in a special sharps container. Do not put them in a trash can. If you do not have a sharps container, call your pharmacist or healthcare provider to get one. A special MedGuide will be given to you by the pharmacist with each prescription and refill. Be sure to read this information carefully each time. Talk to your pediatrician regarding the use of this medicine in children. While this medicine may be used in children as young as 1 month of age for selected conditions, precautions do  apply. Overdosage: If you think you have taken too much of this medicine contact a poison control center or emergency room at once. NOTE: This medicine is only for you. Do not share this medicine with others. What if I miss a dose? If you miss a dose, take it as soon as you can. If it is almost time for your next dose, take only that dose. Do not take double or extra doses. What may interact with this medicine? Do not take this medicine with any of the following medications:  epoetin alfa This list may not describe all possible interactions. Give your health care provider a list of all the medicines, herbs, non-prescription drugs, or dietary supplements you use. Also tell them if you smoke, drink alcohol, or use illegal drugs. Some items may interact with your medicine. What should I watch for while using this medicine? Your condition will be monitored carefully while you are receiving this medicine. You may need blood work done while you are taking this medicine. This medicine may cause a decrease in vitamin B6. You should make sure that you get enough vitamin B6 while you are taking this medicine. Discuss the foods you eat and the vitamins you take with your health care professional. What side effects may I notice from receiving this medicine? Side effects that you should report to your doctor or health care professional as soon as possible:  allergic reactions like skin rash, itching or hives, swelling of the face, lips, or tongue  breathing problems  changes in   vision  chest pain  confusion, trouble speaking or understanding  feeling faint or lightheaded, falls  high blood pressure  muscle aches or pains  pain, swelling, warmth in the leg  rapid weight gain  severe headaches  sudden numbness or weakness of the face, arm or leg  trouble walking, dizziness, loss of balance or coordination  seizures (convulsions)  swelling of the ankles, feet, hands  unusually weak or  tired Side effects that usually do not require medical attention (report to your doctor or health care professional if they continue or are bothersome):  diarrhea  fever, chills (flu-like symptoms)  headaches  nausea, vomiting  redness, stinging, or swelling at site where injected This list may not describe all possible side effects. Call your doctor for medical advice about side effects. You may report side effects to FDA at 1-800-FDA-1088. Where should I keep my medicine? Keep out of the reach of children. Store in a refrigerator between 2 and 8 degrees C (36 and 46 degrees F). Do not freeze. Do not shake. Throw away any unused portion if using a single-dose vial. Throw away any unused medicine after the expiration date. NOTE: This sheet is a summary. It may not cover all possible information. If you have questions about this medicine, talk to your doctor, pharmacist, or health care provider.  2020 Elsevier/Gold Standard (2017-05-18 16:44:20)  

## 2019-07-21 LAB — IGG, IGA, IGM
IgA: 516 mg/dL — ABNORMAL HIGH (ref 64–422)
IgG (Immunoglobin G), Serum: 1091 mg/dL (ref 586–1602)
IgM (Immunoglobulin M), Srm: 31 mg/dL (ref 26–217)

## 2019-07-23 LAB — PROTEIN ELECTROPHORESIS, SERUM
A/G Ratio: 1.3 (ref 0.7–1.7)
Albumin ELP: 3.8 g/dL (ref 2.9–4.4)
Alpha-1-Globulin: 0.2 g/dL (ref 0.0–0.4)
Alpha-2-Globulin: 0.6 g/dL (ref 0.4–1.0)
Beta Globulin: 1.2 g/dL (ref 0.7–1.3)
Gamma Globulin: 0.9 g/dL (ref 0.4–1.8)
Globulin, Total: 2.9 g/dL (ref 2.2–3.9)
Total Protein ELP: 6.7 g/dL (ref 6.0–8.5)

## 2019-07-23 LAB — KAPPA/LAMBDA LIGHT CHAINS
Kappa free light chain: 28.6 mg/L — ABNORMAL HIGH (ref 3.3–19.4)
Kappa, lambda light chain ratio: 0.65 (ref 0.26–1.65)
Lambda free light chains: 43.7 mg/L — ABNORMAL HIGH (ref 5.7–26.3)

## 2019-07-23 LAB — FERRITIN: Ferritin: 344 ng/mL — ABNORMAL HIGH (ref 11–307)

## 2019-07-23 LAB — IRON AND TIBC
Iron: 78 ug/dL (ref 41–142)
Saturation Ratios: 27 % (ref 21–57)
TIBC: 286 ug/dL (ref 236–444)
UIBC: 208 ug/dL (ref 120–384)

## 2019-07-29 ENCOUNTER — Other Ambulatory Visit: Payer: Self-pay | Admitting: Internal Medicine

## 2019-08-10 ENCOUNTER — Inpatient Hospital Stay (HOSPITAL_BASED_OUTPATIENT_CLINIC_OR_DEPARTMENT_OTHER): Payer: Medicare Other | Admitting: Family

## 2019-08-10 ENCOUNTER — Inpatient Hospital Stay: Payer: Medicare Other

## 2019-08-10 ENCOUNTER — Telehealth: Payer: Self-pay | Admitting: Family

## 2019-08-10 ENCOUNTER — Encounter: Payer: Self-pay | Admitting: Family

## 2019-08-10 ENCOUNTER — Other Ambulatory Visit: Payer: Self-pay

## 2019-08-10 VITALS — BP 126/59 | HR 84 | Temp 96.0°F | Resp 18 | Ht 64.0 in | Wt 199.4 lb

## 2019-08-10 DIAGNOSIS — D631 Anemia in chronic kidney disease: Secondary | ICD-10-CM

## 2019-08-10 DIAGNOSIS — N182 Chronic kidney disease, stage 2 (mild): Secondary | ICD-10-CM

## 2019-08-10 DIAGNOSIS — R531 Weakness: Secondary | ICD-10-CM | POA: Diagnosis not present

## 2019-08-10 DIAGNOSIS — D472 Monoclonal gammopathy: Secondary | ICD-10-CM | POA: Diagnosis not present

## 2019-08-10 DIAGNOSIS — D508 Other iron deficiency anemias: Secondary | ICD-10-CM | POA: Diagnosis not present

## 2019-08-10 DIAGNOSIS — R5383 Other fatigue: Secondary | ICD-10-CM | POA: Diagnosis not present

## 2019-08-10 DIAGNOSIS — D509 Iron deficiency anemia, unspecified: Secondary | ICD-10-CM | POA: Diagnosis not present

## 2019-08-10 LAB — CBC WITH DIFFERENTIAL (CANCER CENTER ONLY)
Abs Immature Granulocytes: 0 10*3/uL (ref 0.00–0.07)
Basophils Absolute: 0.1 10*3/uL (ref 0.0–0.1)
Basophils Relative: 1 %
Eosinophils Absolute: 0.1 10*3/uL (ref 0.0–0.5)
Eosinophils Relative: 3 %
HCT: 36.1 % (ref 36.0–46.0)
Hemoglobin: 11 g/dL — ABNORMAL LOW (ref 12.0–15.0)
Immature Granulocytes: 0 %
Lymphocytes Relative: 51 %
Lymphs Abs: 2.1 10*3/uL (ref 0.7–4.0)
MCH: 32.3 pg (ref 26.0–34.0)
MCHC: 30.5 g/dL (ref 30.0–36.0)
MCV: 105.9 fL — ABNORMAL HIGH (ref 80.0–100.0)
Monocytes Absolute: 0.6 10*3/uL (ref 0.1–1.0)
Monocytes Relative: 15 %
Neutro Abs: 1.3 10*3/uL — ABNORMAL LOW (ref 1.7–7.7)
Neutrophils Relative %: 30 %
Platelet Count: 249 10*3/uL (ref 150–400)
RBC: 3.41 MIL/uL — ABNORMAL LOW (ref 3.87–5.11)
RDW: 15.3 % (ref 11.5–15.5)
WBC Count: 4.2 10*3/uL (ref 4.0–10.5)
nRBC: 0 % (ref 0.0–0.2)

## 2019-08-10 LAB — CMP (CANCER CENTER ONLY)
ALT: 15 U/L (ref 0–44)
AST: 15 U/L (ref 15–41)
Albumin: 4.5 g/dL (ref 3.5–5.0)
Alkaline Phosphatase: 66 U/L (ref 38–126)
Anion gap: 9 (ref 5–15)
BUN: 46 mg/dL — ABNORMAL HIGH (ref 8–23)
CO2: 26 mmol/L (ref 22–32)
Calcium: 9.9 mg/dL (ref 8.9–10.3)
Chloride: 110 mmol/L (ref 98–111)
Creatinine: 2.44 mg/dL — ABNORMAL HIGH (ref 0.44–1.00)
GFR, Est AFR Am: 22 mL/min — ABNORMAL LOW (ref >60)
GFR, Estimated: 19 mL/min — ABNORMAL LOW (ref >60)
Glucose, Bld: 125 mg/dL — ABNORMAL HIGH (ref 70–99)
Potassium: 4.7 mmol/L (ref 3.5–5.1)
Sodium: 145 mmol/L (ref 135–145)
Total Bilirubin: 0.3 mg/dL (ref 0.3–1.2)
Total Protein: 7.6 g/dL (ref 6.5–8.1)

## 2019-08-10 NOTE — Telephone Encounter (Signed)
Appointments scheduled calendar/avs printed per 3/26 los

## 2019-08-10 NOTE — Progress Notes (Signed)
Hematology and Oncology Follow Up Visit  Tina Molina [id number] 1945-10-02 74 y.o. 08/10/2019   Principle Diagnosis:  Anemia of chronic renal failure - stage II MGUS - IgA lambda Iron deficiency anemia  Current Therapy:        Aranesp 300 mcg SQ to keep Hgb > 11 IV iron as indicated   Interim History:  Tina Molina is here today for follow-up. She is doing well and feeling much better since her last Aranesp injection. Hgb is up to 11.0 from 7.8.  She still has some mild fatigue.  She has occasional SOB and palpitations with over exertion. She states that she is still able to finish what she is doing and will take a break to rest after if needed.   She has not noted any blood loss. No bruising or petechiae. No M-spike noted earlier this month.  IgA level 516 and lambda light chains 4.37 mg/dL.  No fever, chills, n/v, cough, rash, dizziness, chest pain, abdominal pain or changes in bowel or bladder habits.  No swelling or tenderness in her extremities at this time.  The numbness and tingling in her feet is unchanged.  No falls or syncopal episodes to report.  She has maintained a good appetite and is staying well hydrated. Her weight is stable.   ECOG Performance Status: 1 - Symptomatic but completely ambulatory  Medications:  Allergies as of 08/10/2019      Reactions   Penicillins Other (See Comments)   Intolerance or allergy not remembered; her sister stated penicillin caused "boils under the skin"   Shellfish Allergy Rash   Shellfish-derived Products Anaphylaxis   Sulfonamide Derivatives    Arthralgias; her sister stated that this caused a rash.   Codeine Nausea Only   Nausea only with liquid codeine   Infliximab Other (See Comments)    Confusion (intolerance)   Azithromycin Itching      Medication List       Accurate as of August 10, 2019 12:24 PM. If you have any questions, ask your nurse or doctor.        aspirin 81 MG tablet Take 81 mg by mouth daily.     atorvastatin 20 MG tablet Commonly known as: LIPITOR TAKE 1 TABLET(20 MG) BY MOUTH DAILY   carvedilol 25 MG tablet Commonly known as: COREG Take 1 tablet (25 mg total) by mouth 2 (two) times daily.   cyclobenzaprine 10 MG tablet Commonly known as: FLEXERIL Take 10 mg by mouth 3 (three) times daily as needed for muscle spasms.   famotidine 40 MG tablet Commonly known as: PEPCID TAKE 1 TABLET(40 MG) BY MOUTH TWICE DAILY   furosemide 20 MG tablet Commonly known as: LASIX Take 20 mg by mouth daily.   gabapentin 300 MG capsule Commonly known as: NEURONTIN TAKE 1 CAPSULE BY MOUTH EVERY 8 HOURS AS NEEDED   hydrALAZINE 50 MG tablet Commonly known as: APRESOLINE TAKE 1 TABLET(50 MG) BY MOUTH THREE TIMES DAILY   leflunomide 20 MG tablet Commonly known as: ARAVA Take 20 mg by mouth daily.   levothyroxine 200 MCG tablet Commonly known as: SYNTHROID TAKE 1 TABLET(200 MCG) BY MOUTH DAILY BEFORE BREAKFAST   Lumigan 0.01 % Soln Generic drug: bimatoprost INSTILL 1 DROP INTO BOTH EYES ONCE DAILY AT BEDTIME   MELATONIN GUMMIES PO Take by mouth at bedtime as needed.   multivitamin with minerals Tabs tablet Take 1 tablet by mouth daily.   spironolactone 50 MG tablet Commonly known as: ALDACTONE Take 1/2 tablet (  25 MCG) by mouth daily.   Symbicort 160-4.5 MCG/ACT inhaler Generic drug: budesonide-formoterol inhale 2 puffs if needed for wheezing   telmisartan 40 MG tablet Commonly known as: MICARDIS Take 40 mg by mouth daily.   vitamin E 180 MG (400 UNITS) capsule Take 400 Units by mouth daily.       Allergies:  Allergies  Allergen Reactions  . Penicillins Other (See Comments)    Intolerance or allergy not remembered; her sister stated penicillin caused "boils under the skin"  . Shellfish Allergy Rash  . Shellfish-Derived Products Anaphylaxis  . Sulfonamide Derivatives     Arthralgias; her sister stated that this caused a rash.  . Codeine Nausea Only    Nausea only  with liquid codeine  . Infliximab Other (See Comments)     Confusion (intolerance)  . Azithromycin Itching    Past Medical History, Surgical history, Social history, and Family History were reviewed and updated.  Review of Systems: All other 10 point review of systems is negative.   Physical Exam:  height is 5\' 4"  (1.626 m) and weight is 199 lb 6.4 oz (90.4 kg). Her temporal temperature is 96 F (35.6 C) (abnormal). Her blood pressure is 126/59 (abnormal) and her pulse is 84. Her respiration is 18 and oxygen saturation is 100%.   Wt Readings from Last 3 Encounters:  08/10/19 199 lb 6.4 oz (90.4 kg)  07/20/19 200 lb 12.8 oz (91.1 kg)  04/05/19 199 lb 8 oz (90.5 kg)    Ocular: Sclerae unicteric, pupils equal, round and reactive to light Ear-nose-throat: Oropharynx clear, dentition fair Lymphatic: No cervical or supraclavicular adenopathy Lungs no rales or rhonchi, good excursion bilaterally Heart regular rate and rhythm, no murmur appreciated Abd soft, nontender, positive bowel sounds, no liver or spleen tip palpated on exam, no fluid wave  MSK no focal spinal tenderness, no joint edema Neuro: non-focal, well-oriented, appropriate affect Breasts: Deferred   Lab Results  Component Value Date   WBC 4.2 08/10/2019   HGB 11.0 (L) 08/10/2019   HCT 36.1 08/10/2019   MCV 105.9 (H) 08/10/2019   PLT 249 08/10/2019   Lab Results  Component Value Date   FERRITIN 344 (H) 07/20/2019   IRON 78 07/20/2019   TIBC 286 07/20/2019   UIBC 208 07/20/2019   IRONPCTSAT 27 07/20/2019   Lab Results  Component Value Date   RETICCTPCT 2.8 07/20/2019   RBC 3.41 (L) 08/10/2019   RETICCTABS 51.6 01/03/2013   Lab Results  Component Value Date   KPAFRELGTCHN 28.6 (H) 07/20/2019   LAMBDASER 43.7 (H) 07/20/2019   KAPLAMBRATIO 0.65 07/20/2019   Lab Results  Component Value Date   IGGSERUM 1,091 07/20/2019   IGA 516 (H) 07/20/2019   IGMSERUM 31 07/20/2019   Lab Results  Component Value  Date   TOTALPROTELP 6.7 07/20/2019   ALBUMINELP 3.8 07/20/2019   A1GS 0.2 07/20/2019   A2GS 0.6 07/20/2019   BETS 1.2 07/20/2019   BETA2SER 10.3 (H) 11/04/2011   GAMS 0.9 07/20/2019   MSPIKE Not Observed 07/20/2019   SPEI Comment 07/20/2019     Chemistry      Component Value Date/Time   NA 145 08/10/2019 1103   NA 150 (H) 03/01/2017 1308   NA 143 04/05/2016 1156   K 4.7 08/10/2019 1103   K 4.0 03/01/2017 1308   K 3.8 04/05/2016 1156   CL 110 08/10/2019 1103   CL 109 (H) 03/01/2017 1308   CO2 26 08/10/2019 1103   CO2 28 03/01/2017  1308   CO2 21 (L) 04/05/2016 1156   BUN 46 (H) 08/10/2019 1103   BUN 19 03/01/2017 1308   BUN 18.5 04/05/2016 1156   CREATININE 2.44 (H) 08/10/2019 1103   CREATININE 1.8 (H) 03/01/2017 1308   CREATININE 1.5 (H) 04/05/2016 1156      Component Value Date/Time   CALCIUM 9.9 08/10/2019 1103   CALCIUM 9.7 03/01/2017 1308   CALCIUM 9.3 04/05/2016 1156   ALKPHOS 66 08/10/2019 1103   ALKPHOS 55 03/01/2017 1308   ALKPHOS 77 04/05/2016 1156   AST 15 08/10/2019 1103   AST 21 04/05/2016 1156   ALT 15 08/10/2019 1103   ALT 18 03/01/2017 1308   ALT 22 04/05/2016 1156   BILITOT 0.3 08/10/2019 1103   BILITOT 0.36 04/05/2016 1156       Impression and Plan: Tina Molina is a very pleasant 28 yoAfrican American female with multifactorial anemia as well as an IgA lambda MGUS. No Aranesp needed this visit. Hgb is 11.0.  We will see what her iron studies look like and replace next week if needed.  We will see her again in another 3 weeks for follow-up.  She is in agreement and will contact our office with any questions or concerns. We can certainly see her sooner if needed.   Laverna Peace, NP 3/26/202112:24 PM

## 2019-08-13 NOTE — Progress Notes (Signed)
Subjective:    Patient ID: Tina Molina, female    DOB: 10-Jun-1945, 74 y.o.   MRN: [id number]  HPI The patient is here for follow up of their chronic medical problems, including hypertension, hypothyroidism, hyperlipidemia, GERD, CKD.  She is taking all of her medications as prescribed.   She is not exercising regularly.   She feels she is eating well.    She is following with nephrology, hematology.     Medications and allergies reviewed with patient and updated if appropriate.  Patient Active Problem List   Diagnosis Date Noted  . Breast tenderness in female 07/17/2018  . Nocturia more than twice per night 10/25/2017  . Insomnia secondary to chronic pain 10/25/2017  . Upper back pain on left side 04/20/2017  . IDA (iron deficiency anemia) 04/06/2016  . Erythropoietin deficiency anemia 02/23/2016  . GERD (gastroesophageal reflux disease) 06/25/2015  . Hyperlipidemia 01/13/2015  . Hyperglycemia 01/13/2015  . Rheumatoid factor positive 02/20/2014  . Thoracic radiculopathy 10/30/2013  . Circadian rhythm sleep disorder, shift work type 11/03/2012  . Hematuria 08/05/2012  . MGUS (monoclonal gammopathy of unknown significance) 08/03/2012  . Lumbar stenosis with neurogenic claudication 02/29/2012  . Edema 09/13/2011  . Chronic kidney disease, stage IV (severe) ([place]) 12/19/2009  . HERNIATED LUMBAR DISC 12/19/2009  . PULMONARY SARCOIDOSIS 10/16/2009  . Hypothyroidism 10/16/2009  . Anemia associated with stage 2 chronic renal failure 10/16/2009  . CARPAL TUNNEL SYNDROME, BILATERAL 10/16/2009  . UVEITIS 10/16/2009  . Essential hypertension 10/16/2009  . ARTHRITIS 10/16/2009  . CERVICAL DISC DISORDER 10/16/2009    Current Outpatient Medications on File Prior to Visit  Medication Sig Dispense Refill  . aspirin 81 MG tablet Take 81 mg by mouth daily.     Marland Kitchen atorvastatin (LIPITOR) 20 MG tablet TAKE 1 TABLET(20 MG) BY MOUTH DAILY 90 tablet 1  . carvedilol (COREG) 25 MG  tablet Take 1 tablet (25 mg total) by mouth 2 (two) times daily. 180 tablet 1  . cyclobenzaprine (FLEXERIL) 10 MG tablet Take 10 mg by mouth 3 (three) times daily as needed for muscle spasms.    . famotidine (PEPCID) 40 MG tablet TAKE 1 TABLET(40 MG) BY MOUTH TWICE DAILY 60 tablet 5  . furosemide (LASIX) 20 MG tablet Take 20 mg by mouth daily.    Marland Kitchen gabapentin (NEURONTIN) 300 MG capsule TAKE 1 CAPSULE BY MOUTH EVERY 8 HOURS AS NEEDED 90 capsule 0  . hydrALAZINE (APRESOLINE) 50 MG tablet TAKE 1 TABLET(50 MG) BY MOUTH THREE TIMES DAILY 270 tablet 0  . leflunomide (ARAVA) 20 MG tablet Take 20 mg by mouth daily.    Marland Kitchen levothyroxine (SYNTHROID) 200 MCG tablet TAKE 1 TABLET(200 MCG) BY MOUTH DAILY BEFORE BREAKFAST 90 tablet 0  . LUMIGAN 0.01 % SOLN INSTILL 1 DROP INTO BOTH EYES ONCE DAILY AT BEDTIME  0  . MELATONIN GUMMIES PO Take by mouth at bedtime as needed.    . Multiple Vitamin (MULTIVITAMIN WITH MINERALS) TABS Take 1 tablet by mouth daily.    Marland Kitchen spironolactone (ALDACTONE) 50 MG tablet Take 1/2 tablet (25 MCG) by mouth daily. 45 tablet 1  . SYMBICORT 160-4.5 MCG/ACT inhaler inhale 2 puffs if needed for wheezing 10.2 g 0  . telmisartan (MICARDIS) 40 MG tablet Take 40 mg by mouth daily. Taking half a tablet    . vitamin E 400 UNIT capsule Take 400 Units by mouth daily.     . [DISCONTINUED] modafinil (PROVIGIL) 100 MG tablet Take 100 mg by mouth  daily.    . [DISCONTINUED] potassium chloride (MICRO-K) 10 MEQ CR capsule Take 1 capsule (10 mEq total) by mouth 2 (two) times daily. 180 capsule 1   No current facility-administered medications on file prior to visit.    Past Medical History:  Diagnosis Date  . Anemia    Associated with leukopenia and lymphocytosis. This is been evaluated by Dr. Marin Olp . Her platelet count has been normal  . Arthritis   . Chronic back pain   . Chronic kidney disease   . Erythropoietin deficiency anemia 02/23/2016  . GERD (gastroesophageal reflux disease)   .  Hypertension   . Lymphocytosis   . MGUS (monoclonal gammopathy of unknown significance) 08/03/2012   IgA 792 11/04/11  . Pneumonia 04/2010   OP  . Renal insufficiency   . Sarcoidosis   . Thyroid disease     Past Surgical History:  Procedure Laterality Date  . CARPAL TUNNEL RELEASE     Bilateral   . CATARACT EXTRACTION Right 05/2015  . CERVICAL DISCECTOMY  06/2010   Dr.Pool  . CHOLECYSTECTOMY    . COLONOSCOPY  2010  . POSTERIOR LUMBAR FUSION  02/29/2012   Dr Alyson Locket op respiratory & renal compromise  . THYROIDECTOMY  2003   For goiter    Social History   Socioeconomic History  . Marital status: Divorced    Spouse name: Not on file  . Number of children: 0  . Years of education: 51  . Highest education level: Not on file  Occupational History  . Occupation: Retired    Fish farm manager: RETIRED  Tobacco Use  . Smoking status: Former Smoker    Packs/day: 0.50    Years: 20.00    Pack years: 10.00    Types: Cigarettes    Quit date: 05/17/1997    Years since quitting: 22.2  . Smokeless tobacco: Never Used  Substance and Sexual Activity  . Alcohol use: Yes    Alcohol/week: 1.0 standard drinks    Types: 1 Glasses of wine per week    Comment: occasionally   . Drug use: No  . Sexual activity: Not Currently  Other Topics Concern  . Not on file  Social History Narrative   Patient is divorced and lives with her sister. Patient is retired.   Caffeine- sometimes - one cup   Right handed.   Social Determinants of Health   Financial Resource Strain:   . Difficulty of Paying Living Expenses:   Food Insecurity:   . Worried About Charity fundraiser in the Last Year:   . Arboriculturist in the Last Year:   Transportation Needs:   . Film/video editor (Medical):   Marland Kitchen Lack of Transportation (Non-Medical):   Physical Activity:   . Days of Exercise per Week:   . Minutes of Exercise per Session:   Stress:   . Feeling of Stress :   Social Connections:   . Frequency of  Communication with Friends and Family:   . Frequency of Social Gatherings with Friends and Family:   . Attends Religious Services:   . Active Member of Clubs or Organizations:   . Attends Archivist Meetings:   Marland Kitchen Marital Status:     Family History  Problem Relation Age of Onset  . Anemia Father   . Arthritis Father   . Heart failure Father 23       No CAD  . Diabetes Mother   . Arthritis Mother   . Glaucoma Mother   .  Heart disease Mother 68       No CAD  . Hypertension Brother   . Anemia Brother   . Kidney disease Brother   . Hypertension Sister   . Kidney disease Sister   . Anemia Sister   . Diabetes Maternal Aunt   . Arthritis Paternal Grandmother     Review of Systems  Constitutional: Negative for chills and fever.  Respiratory: Positive for shortness of breath (wtih exertion - chronic, no change). Negative for cough and wheezing.   Cardiovascular: Positive for palpitations (with exertion) and leg swelling (mild at times). Negative for chest pain.  Neurological: Negative for light-headedness and headaches.  Psychiatric/Behavioral: Negative for sleep disturbance.       Objective:   Vitals:   08/14/19 1317  BP: (!) 156/70  Pulse: 81  Resp: 16  Temp: 98.6 F (37 C)  SpO2: 96%   BP Readings from Last 3 Encounters:  08/14/19 (!) 156/70  08/10/19 (!) 126/59  07/20/19 (!) 167/78   Wt Readings from Last 3 Encounters:  08/14/19 201 lb 12.8 oz (91.5 kg)  08/10/19 199 lb 6.4 oz (90.4 kg)  07/20/19 200 lb 12.8 oz (91.1 kg)   Body mass index is 34.64 kg/m.   Physical Exam    Constitutional: Appears well-developed and well-nourished. No distress.  HENT:  Head: Normocephalic and atraumatic.  Neck: Neck supple. No tracheal deviation present. No thyromegaly present.  No cervical lymphadenopathy Cardiovascular: Normal rate, regular rhythm and normal heart sounds.   No murmur heard. No carotid bruit .  No edema Pulmonary/Chest: Effort normal and breath  sounds normal. No respiratory distress. No has no wheezes. No rales.  Skin: Skin is warm and dry. Not diaphoretic.  Psychiatric: Normal mood and affect. Behavior is normal.      Assessment & Plan:    See Problem List for Assessment and Plan of chronic medical problems.    This visit occurred during the SARS-CoV-2 public health emergency.  Safety protocols were in place, including screening questions prior to the visit, additional usage of staff PPE, and extensive cleaning of exam room while observing appropriate contact time as indicated for disinfecting solutions.

## 2019-08-13 NOTE — Patient Instructions (Addendum)
  Blood work was ordered.   ° ° °Medications reviewed and updated.  Changes include :   none ° ° ° °Please followup in 6 months ° ° °

## 2019-08-14 ENCOUNTER — Encounter: Payer: Self-pay | Admitting: Internal Medicine

## 2019-08-14 ENCOUNTER — Other Ambulatory Visit: Payer: Self-pay

## 2019-08-14 ENCOUNTER — Ambulatory Visit (INDEPENDENT_AMBULATORY_CARE_PROVIDER_SITE_OTHER): Payer: Medicare Other | Admitting: Internal Medicine

## 2019-08-14 VITALS — BP 156/70 | HR 81 | Temp 98.6°F | Resp 16 | Ht 64.0 in | Wt 201.8 lb

## 2019-08-14 DIAGNOSIS — E890 Postprocedural hypothyroidism: Secondary | ICD-10-CM | POA: Diagnosis not present

## 2019-08-14 DIAGNOSIS — R739 Hyperglycemia, unspecified: Secondary | ICD-10-CM

## 2019-08-14 DIAGNOSIS — Z1211 Encounter for screening for malignant neoplasm of colon: Secondary | ICD-10-CM | POA: Diagnosis not present

## 2019-08-14 DIAGNOSIS — I10 Essential (primary) hypertension: Secondary | ICD-10-CM | POA: Diagnosis not present

## 2019-08-14 DIAGNOSIS — K219 Gastro-esophageal reflux disease without esophagitis: Secondary | ICD-10-CM

## 2019-08-14 DIAGNOSIS — N184 Chronic kidney disease, stage 4 (severe): Secondary | ICD-10-CM

## 2019-08-14 DIAGNOSIS — E7849 Other hyperlipidemia: Secondary | ICD-10-CM | POA: Diagnosis not present

## 2019-08-14 LAB — LIPID PANEL
Cholesterol: 167 mg/dL (ref 0–200)
HDL: 55.7 mg/dL (ref >39.00)
LDL Cholesterol: 88 mg/dL (ref 0–99)
NonHDL: 111.68
Total CHOL/HDL Ratio: 3
Triglycerides: 116 mg/dL (ref 0.0–149.0)
VLDL: 23.2 mg/dL (ref 0.0–40.0)

## 2019-08-14 LAB — HEMOGLOBIN A1C: Hgb A1c MFr Bld: 4.5 % — ABNORMAL LOW (ref 4.6–6.5)

## 2019-08-14 LAB — TSH: TSH: 0.34 u[IU]/mL — ABNORMAL LOW (ref 0.35–4.50)

## 2019-08-14 NOTE — Assessment & Plan Note (Signed)
Following with nephrology-visits up-to-date

## 2019-08-14 NOTE — Assessment & Plan Note (Signed)
Chronic Check lipid panel  Continue daily statin Regular exercise and healthy diet encouraged  

## 2019-08-14 NOTE — Assessment & Plan Note (Addendum)
Chronic BP not ideally controlled here but better controlled at home Current regimen effective and well tolerated Continue current medications at current doses

## 2019-08-14 NOTE — Assessment & Plan Note (Signed)
Chronic GERD controlled Continue daily medication-Pepcid 40 mg

## 2019-08-14 NOTE — Assessment & Plan Note (Signed)
Chronic Check A1c Low sugar/carb diet

## 2019-08-14 NOTE — Assessment & Plan Note (Signed)
Chronic  Clinically euthyroid Check tsh  Titrate med dose if needed  

## 2019-08-16 ENCOUNTER — Other Ambulatory Visit: Payer: Self-pay | Admitting: Internal Medicine

## 2019-08-16 MED ORDER — LEVOTHYROXINE SODIUM 175 MCG PO TABS: 175.0000 ug | ORAL_TABLET | Freq: Every day | ORAL | 1 refills | Status: DC

## 2019-08-16 NOTE — Addendum Note (Signed)
Addended by: Delice Bison E on: 08/16/2019 02:38 PM   Modules accepted: Orders

## 2019-08-26 ENCOUNTER — Other Ambulatory Visit: Payer: Self-pay | Admitting: Internal Medicine

## 2019-08-28 ENCOUNTER — Other Ambulatory Visit: Payer: Medicare Other

## 2019-08-28 ENCOUNTER — Ambulatory Visit: Payer: Medicare Other

## 2019-08-28 ENCOUNTER — Ambulatory Visit: Payer: Medicare Other | Admitting: Family

## 2019-08-28 DIAGNOSIS — H40053 Ocular hypertension, bilateral: Secondary | ICD-10-CM | POA: Diagnosis not present

## 2019-08-28 NOTE — Telephone Encounter (Signed)
A user error has taken place: closing encounter../lmb  

## 2019-08-30 ENCOUNTER — Ambulatory Visit: Payer: Medicare Other

## 2019-08-30 ENCOUNTER — Ambulatory Visit: Payer: Medicare Other | Admitting: Family

## 2019-08-30 ENCOUNTER — Other Ambulatory Visit: Payer: Medicare Other

## 2019-09-01 ENCOUNTER — Other Ambulatory Visit: Payer: Self-pay | Admitting: Internal Medicine

## 2019-09-04 ENCOUNTER — Inpatient Hospital Stay: Payer: Medicare Other

## 2019-09-04 ENCOUNTER — Inpatient Hospital Stay (HOSPITAL_BASED_OUTPATIENT_CLINIC_OR_DEPARTMENT_OTHER): Payer: Medicare Other | Admitting: Family

## 2019-09-04 ENCOUNTER — Inpatient Hospital Stay: Payer: Medicare Other | Attending: Hematology & Oncology

## 2019-09-04 ENCOUNTER — Encounter: Payer: Self-pay | Admitting: Family

## 2019-09-04 ENCOUNTER — Other Ambulatory Visit: Payer: Self-pay

## 2019-09-04 VITALS — BP 142/62 | HR 84 | Temp 96.9°F | Resp 18 | Ht 64.0 in | Wt 202.0 lb

## 2019-09-04 DIAGNOSIS — D508 Other iron deficiency anemias: Secondary | ICD-10-CM | POA: Diagnosis not present

## 2019-09-04 DIAGNOSIS — D631 Anemia in chronic kidney disease: Secondary | ICD-10-CM

## 2019-09-04 DIAGNOSIS — Z7982 Long term (current) use of aspirin: Secondary | ICD-10-CM | POA: Insufficient documentation

## 2019-09-04 DIAGNOSIS — D472 Monoclonal gammopathy: Secondary | ICD-10-CM

## 2019-09-04 DIAGNOSIS — N182 Chronic kidney disease, stage 2 (mild): Secondary | ICD-10-CM

## 2019-09-04 DIAGNOSIS — Z79899 Other long term (current) drug therapy: Secondary | ICD-10-CM | POA: Insufficient documentation

## 2019-09-04 LAB — CBC WITH DIFFERENTIAL (CANCER CENTER ONLY)
Abs Immature Granulocytes: 0.02 10*3/uL (ref 0.00–0.07)
Basophils Absolute: 0 10*3/uL (ref 0.0–0.1)
Basophils Relative: 1 %
Eosinophils Absolute: 0.2 10*3/uL (ref 0.0–0.5)
Eosinophils Relative: 5 %
HCT: 31.2 % — ABNORMAL LOW (ref 36.0–46.0)
Hemoglobin: 9.9 g/dL — ABNORMAL LOW (ref 12.0–15.0)
Immature Granulocytes: 1 %
Lymphocytes Relative: 55 %
Lymphs Abs: 2 10*3/uL (ref 0.7–4.0)
MCH: 31.3 pg (ref 26.0–34.0)
MCHC: 31.7 g/dL (ref 30.0–36.0)
MCV: 98.7 fL (ref 80.0–100.0)
Monocytes Absolute: 0.5 10*3/uL (ref 0.1–1.0)
Monocytes Relative: 15 %
Neutro Abs: 0.8 10*3/uL — ABNORMAL LOW (ref 1.7–7.7)
Neutrophils Relative %: 23 %
Platelet Count: 166 10*3/uL (ref 150–400)
RBC: 3.16 MIL/uL — ABNORMAL LOW (ref 3.87–5.11)
RDW: 13.5 % (ref 11.5–15.5)
WBC Count: 3.5 10*3/uL — ABNORMAL LOW (ref 4.0–10.5)
nRBC: 0 % (ref 0.0–0.2)

## 2019-09-04 LAB — CMP (CANCER CENTER ONLY)
ALT: 15 U/L (ref 0–44)
AST: 17 U/L (ref 15–41)
Albumin: 4.3 g/dL (ref 3.5–5.0)
Alkaline Phosphatase: 62 U/L (ref 38–126)
Anion gap: 6 (ref 5–15)
BUN: 42 mg/dL — ABNORMAL HIGH (ref 8–23)
CO2: 26 mmol/L (ref 22–32)
Calcium: 10.1 mg/dL (ref 8.9–10.3)
Chloride: 112 mmol/L — ABNORMAL HIGH (ref 98–111)
Creatinine: 2.12 mg/dL — ABNORMAL HIGH (ref 0.44–1.00)
GFR, Est AFR Am: 26 mL/min — ABNORMAL LOW (ref >60)
GFR, Estimated: 23 mL/min — ABNORMAL LOW (ref >60)
Glucose, Bld: 107 mg/dL — ABNORMAL HIGH (ref 70–99)
Potassium: 5.4 mmol/L — ABNORMAL HIGH (ref 3.5–5.1)
Sodium: 144 mmol/L (ref 135–145)
Total Bilirubin: 0.3 mg/dL (ref 0.3–1.2)
Total Protein: 7.3 g/dL (ref 6.5–8.1)

## 2019-09-04 LAB — RETICULOCYTES
Immature Retic Fract: 9.5 % (ref 2.3–15.9)
RBC.: 3.11 MIL/uL — ABNORMAL LOW (ref 3.87–5.11)
Retic Count, Absolute: 31.1 10*3/uL (ref 19.0–186.0)
Retic Ct Pct: 1 % (ref 0.4–3.1)

## 2019-09-04 MED ORDER — DARBEPOETIN ALFA 200 MCG/0.4ML IJ SOSY
PREFILLED_SYRINGE | INTRAMUSCULAR | Status: AC
  Filled 2019-09-04: qty 0.4

## 2019-09-04 MED ORDER — DARBEPOETIN ALFA 300 MCG/0.6ML IJ SOSY
300.0000 ug | PREFILLED_SYRINGE | Freq: Once | INTRAMUSCULAR | Status: AC
  Administered 2019-09-04: 300 ug via SUBCUTANEOUS

## 2019-09-04 MED ORDER — DARBEPOETIN ALFA 300 MCG/0.6ML IJ SOSY
PREFILLED_SYRINGE | INTRAMUSCULAR | Status: AC
  Filled 2019-09-04: qty 0.6

## 2019-09-04 NOTE — Patient Instructions (Signed)
Darbepoetin Alfa injection What is this medicine? DARBEPOETIN ALFA (dar be POE e tin AL fa) helps your body make more red blood cells. It is used to treat anemia caused by chronic kidney failure and chemotherapy. This medicine may be used for other purposes; ask your health care provider or pharmacist if you have questions. COMMON BRAND NAME(S): Aranesp What should I tell my health care provider before I take this medicine? They need to know if you have any of these conditions:  blood clotting disorders or history of blood clots  cancer patient not on chemotherapy  cystic fibrosis  heart disease, such as angina, heart failure, or a history of a heart attack  hemoglobin level of 12 g/dL or greater  high blood pressure  low levels of folate, iron, or vitamin B12  seizures  an unusual or allergic reaction to darbepoetin, erythropoietin, albumin, hamster proteins, latex, other medicines, foods, dyes, or preservatives  pregnant or trying to get pregnant  breast-feeding How should I use this medicine? This medicine is for injection into a vein or under the skin. It is usually given by a health care professional in a hospital or clinic setting. If you get this medicine at home, you will be taught how to prepare and give this medicine. Use exactly as directed. Take your medicine at regular intervals. Do not take your medicine more often than directed. It is important that you put your used needles and syringes in a special sharps container. Do not put them in a trash can. If you do not have a sharps container, call your pharmacist or healthcare provider to get one. A special MedGuide will be given to you by the pharmacist with each prescription and refill. Be sure to read this information carefully each time. Talk to your pediatrician regarding the use of this medicine in children. While this medicine may be used in children as young as 1 month of age for selected conditions, precautions do  apply. Overdosage: If you think you have taken too much of this medicine contact a poison control center or emergency room at once. NOTE: This medicine is only for you. Do not share this medicine with others. What if I miss a dose? If you miss a dose, take it as soon as you can. If it is almost time for your next dose, take only that dose. Do not take double or extra doses. What may interact with this medicine? Do not take this medicine with any of the following medications:  epoetin alfa This list may not describe all possible interactions. Give your health care provider a list of all the medicines, herbs, non-prescription drugs, or dietary supplements you use. Also tell them if you smoke, drink alcohol, or use illegal drugs. Some items may interact with your medicine. What should I watch for while using this medicine? Your condition will be monitored carefully while you are receiving this medicine. You may need blood work done while you are taking this medicine. This medicine may cause a decrease in vitamin B6. You should make sure that you get enough vitamin B6 while you are taking this medicine. Discuss the foods you eat and the vitamins you take with your health care professional. What side effects may I notice from receiving this medicine? Side effects that you should report to your doctor or health care professional as soon as possible:  allergic reactions like skin rash, itching or hives, swelling of the face, lips, or tongue  breathing problems  changes in   vision  chest pain  confusion, trouble speaking or understanding  feeling faint or lightheaded, falls  high blood pressure  muscle aches or pains  pain, swelling, warmth in the leg  rapid weight gain  severe headaches  sudden numbness or weakness of the face, arm or leg  trouble walking, dizziness, loss of balance or coordination  seizures (convulsions)  swelling of the ankles, feet, hands  unusually weak or  tired Side effects that usually do not require medical attention (report to your doctor or health care professional if they continue or are bothersome):  diarrhea  fever, chills (flu-like symptoms)  headaches  nausea, vomiting  redness, stinging, or swelling at site where injected This list may not describe all possible side effects. Call your doctor for medical advice about side effects. You may report side effects to FDA at 1-800-FDA-1088. Where should I keep my medicine? Keep out of the reach of children. Store in a refrigerator between 2 and 8 degrees C (36 and 46 degrees F). Do not freeze. Do not shake. Throw away any unused portion if using a single-dose vial. Throw away any unused medicine after the expiration date. NOTE: This sheet is a summary. It may not cover all possible information. If you have questions about this medicine, talk to your doctor, pharmacist, or health care provider.  2020 Elsevier/Gold Standard (2017-05-18 16:44:20)  

## 2019-09-04 NOTE — Progress Notes (Signed)
Hematology and Oncology Follow Up Visit  Tina Molina [id number] 1945/08/30 74 y.o. 09/04/2019   Principle Diagnosis:  Anemia of chronic renal failure - stage II MGUS - IgA lambda Iron deficiency anemia  Current Therapy: Aranesp 300 mcg SQ to keep Hgb > 11 IV iron as indicated   Interim History:  Tina Molina is here today for follow-up. She is feeling fatigued and states that she is not sleeping well.  Hgb is 9.9, MCV 98, platelet count 166 and WBC count 3.5.  Last month her M-spike was not detected, IgA level was 516 mg/dL and lambda light chains were 43.7 mg/L.  No fever, chills, n/v, cough, rash, dizziness, SOB, chest pain, palpitations, abdominal pain or changes in bowel or bladder habits.  No episodes of blood loss noted. No bruising or petechiae.  She has intermittent numbness and tingling in her hands and feet due to chronic back issues.  She has noted puffiness in her hands and feet in the early morning when she gets up. This resolves quickly once she gets up and moves around.  No falls or syncopal episodes to report.  She has had both her Pfizer Covid vaccines.  She has maintained a good appetite and is staying well hydrated. Her weight is stable.   ECOG Performance Status: 1 - Symptomatic but completely ambulatory  Medications:  Allergies as of 09/04/2019      Reactions   Penicillins Other (See Comments)   Intolerance or allergy not remembered; her sister stated penicillin caused "boils under the skin"   Shellfish Allergy Rash   Shellfish-derived Products Anaphylaxis   Sulfonamide Derivatives    Arthralgias; her sister stated that this caused a rash.   Codeine Nausea Only   Nausea only with liquid codeine   Infliximab Other (See Comments)    Confusion (intolerance)   Azithromycin Itching      Medication List       Accurate as of September 04, 2019  1:59 PM. If you have any questions, ask your nurse or doctor.        aspirin 81 MG tablet Take 81 mg by  mouth daily.   atorvastatin 20 MG tablet Commonly known as: LIPITOR TAKE 1 TABLET(20 MG) BY MOUTH DAILY   carvedilol 25 MG tablet Commonly known as: COREG TAKE 1 TABLET(25 MG) BY MOUTH TWICE DAILY   cyclobenzaprine 10 MG tablet Commonly known as: FLEXERIL Take 10 mg by mouth 3 (three) times daily as needed for muscle spasms.   famotidine 40 MG tablet Commonly known as: PEPCID TAKE 1 TABLET(40 MG) BY MOUTH TWICE DAILY   furosemide 20 MG tablet Commonly known as: LASIX Take 20 mg by mouth daily.   gabapentin 300 MG capsule Commonly known as: NEURONTIN TAKE 1 CAPSULE BY MOUTH EVERY 8 HOURS AS NEEDED   hydrALAZINE 50 MG tablet Commonly known as: APRESOLINE TAKE 1 TABLET(50 MG) BY MOUTH THREE TIMES DAILY   leflunomide 20 MG tablet Commonly known as: ARAVA Take 20 mg by mouth daily.   levothyroxine 175 MCG tablet Commonly known as: SYNTHROID Take 1 tablet (175 mcg total) by mouth daily before breakfast.   Lumigan 0.01 % Soln Generic drug: bimatoprost INSTILL 1 DROP INTO BOTH EYES ONCE DAILY AT BEDTIME   MELATONIN GUMMIES PO Take by mouth at bedtime as needed.   multivitamin with minerals Tabs tablet Take 1 tablet by mouth daily.   spironolactone 50 MG tablet Commonly known as: ALDACTONE TAKE 1/2 TABLET BY MOUTH ONCE DAILY   Symbicort  160-4.5 MCG/ACT inhaler Generic drug: budesonide-formoterol inhale 2 puffs if needed for wheezing   telmisartan 40 MG tablet Commonly known as: MICARDIS Take 40 mg by mouth daily. Taking half a tablet   vitamin E 180 MG (400 UNITS) capsule Take 400 Units by mouth daily.       Allergies:  Allergies  Allergen Reactions  . Penicillins Other (See Comments)    Intolerance or allergy not remembered; her sister stated penicillin caused "boils under the skin"  . Shellfish Allergy Rash  . Shellfish-Derived Products Anaphylaxis  . Sulfonamide Derivatives     Arthralgias; her sister stated that this caused a rash.  . Codeine  Nausea Only    Nausea only with liquid codeine  . Infliximab Other (See Comments)     Confusion (intolerance)  . Azithromycin Itching    Past Medical History, Surgical history, Social history, and Family History were reviewed and updated.  Review of Systems: All other 10 point review of systems is negative.   Physical Exam:  vitals were not taken for this visit.   Wt Readings from Last 3 Encounters:  08/14/19 201 lb 12.8 oz (91.5 kg)  08/10/19 199 lb 6.4 oz (90.4 kg)  07/20/19 200 lb 12.8 oz (91.1 kg)    Ocular: Sclerae unicteric, pupils equal, round and reactive to light Ear-nose-throat: Oropharynx clear, dentition fair Lymphatic: No cervical or supraclavicular adenopathy Lungs no rales or rhonchi, good excursion bilaterally Heart regular rate and rhythm, no murmur appreciated Abd soft, nontender, positive bowel sounds, no liver or spleen tip palpated on exam, no fluid wave  MSK no focal spinal tenderness, no joint edema Neuro: non-focal, well-oriented, appropriate affect Breasts: Deferred   Lab Results  Component Value Date   WBC 4.2 08/10/2019   HGB 11.0 (L) 08/10/2019   HCT 36.1 08/10/2019   MCV 105.9 (H) 08/10/2019   PLT 249 08/10/2019   Lab Results  Component Value Date   FERRITIN 344 (H) 07/20/2019   IRON 78 07/20/2019   TIBC 286 07/20/2019   UIBC 208 07/20/2019   IRONPCTSAT 27 07/20/2019   Lab Results  Component Value Date   RETICCTPCT 2.8 07/20/2019   RBC 3.41 (L) 08/10/2019   RETICCTABS 51.6 01/03/2013   Lab Results  Component Value Date   KPAFRELGTCHN 28.6 (H) 07/20/2019   LAMBDASER 43.7 (H) 07/20/2019   KAPLAMBRATIO 0.65 07/20/2019   Lab Results  Component Value Date   IGGSERUM 1,091 07/20/2019   IGA 516 (H) 07/20/2019   IGMSERUM 31 07/20/2019   Lab Results  Component Value Date   TOTALPROTELP 6.7 07/20/2019   ALBUMINELP 3.8 07/20/2019   A1GS 0.2 07/20/2019   A2GS 0.6 07/20/2019   BETS 1.2 07/20/2019   BETA2SER 10.3 (H) 11/04/2011    GAMS 0.9 07/20/2019   MSPIKE Not Observed 07/20/2019   SPEI Comment 07/20/2019     Chemistry      Component Value Date/Time   NA 145 08/10/2019 1103   NA 150 (H) 03/01/2017 1308   NA 143 04/05/2016 1156   K 4.7 08/10/2019 1103   K 4.0 03/01/2017 1308   K 3.8 04/05/2016 1156   CL 110 08/10/2019 1103   CL 109 (H) 03/01/2017 1308   CO2 26 08/10/2019 1103   CO2 28 03/01/2017 1308   CO2 21 (L) 04/05/2016 1156   BUN 46 (H) 08/10/2019 1103   BUN 19 03/01/2017 1308   BUN 18.5 04/05/2016 1156   CREATININE 2.44 (H) 08/10/2019 1103   CREATININE 1.8 (H) 03/01/2017 1308  CREATININE 1.5 (H) 04/05/2016 1156      Component Value Date/Time   CALCIUM 9.9 08/10/2019 1103   CALCIUM 9.7 03/01/2017 1308   CALCIUM 9.3 04/05/2016 1156   ALKPHOS 66 08/10/2019 1103   ALKPHOS 55 03/01/2017 1308   ALKPHOS 77 04/05/2016 1156   AST 15 08/10/2019 1103   AST 21 04/05/2016 1156   ALT 15 08/10/2019 1103   ALT 18 03/01/2017 1308   ALT 22 04/05/2016 1156   BILITOT 0.3 08/10/2019 1103   BILITOT 0.36 04/05/2016 1156       Impression and Plan: Ms. Colborn is a very pleasant 41 yoAfrican American female with multifactorial anemia as well as an IgA lambda MGUS. Aranesp given for Hgb 9.9.  We will see what her iron studies look like and replace next week if needed.  We will see her again in another 3 weeks for follow-up.  She is in agreement and will contact our office with any questions or concerns. We can certainly see her sooner if needed.   Laverna Peace, NP 4/20/20211:59 PM

## 2019-09-05 ENCOUNTER — Telehealth: Payer: Self-pay | Admitting: Family

## 2019-09-05 LAB — IRON AND TIBC
Iron: 123 ug/dL (ref 41–142)
Saturation Ratios: 43 % (ref 21–57)
TIBC: 286 ug/dL (ref 236–444)
UIBC: 164 ug/dL (ref 120–384)

## 2019-09-05 LAB — FERRITIN: Ferritin: 220 ng/mL (ref 11–307)

## 2019-09-05 NOTE — Telephone Encounter (Signed)
Appointments scheduled calendar printed & mailed per 4/20 los 

## 2019-09-12 ENCOUNTER — Encounter: Payer: Self-pay | Admitting: Gastroenterology

## 2019-09-25 ENCOUNTER — Other Ambulatory Visit: Payer: Self-pay

## 2019-09-25 ENCOUNTER — Inpatient Hospital Stay: Payer: Medicare Other

## 2019-09-25 ENCOUNTER — Inpatient Hospital Stay: Payer: Medicare Other | Attending: Hematology & Oncology

## 2019-09-25 DIAGNOSIS — N182 Chronic kidney disease, stage 2 (mild): Secondary | ICD-10-CM | POA: Diagnosis not present

## 2019-09-25 DIAGNOSIS — D472 Monoclonal gammopathy: Secondary | ICD-10-CM | POA: Insufficient documentation

## 2019-09-25 DIAGNOSIS — I129 Hypertensive chronic kidney disease with stage 1 through stage 4 chronic kidney disease, or unspecified chronic kidney disease: Secondary | ICD-10-CM | POA: Diagnosis not present

## 2019-09-25 DIAGNOSIS — D508 Other iron deficiency anemias: Secondary | ICD-10-CM

## 2019-09-25 DIAGNOSIS — D631 Anemia in chronic kidney disease: Secondary | ICD-10-CM | POA: Diagnosis not present

## 2019-09-25 LAB — CBC WITH DIFFERENTIAL (CANCER CENTER ONLY)
Abs Immature Granulocytes: 0.03 10*3/uL (ref 0.00–0.07)
Basophils Absolute: 0 10*3/uL (ref 0.0–0.1)
Basophils Relative: 1 %
Eosinophils Absolute: 0.1 10*3/uL (ref 0.0–0.5)
Eosinophils Relative: 3 %
HCT: 35.6 % — ABNORMAL LOW (ref 36.0–46.0)
Hemoglobin: 11 g/dL — ABNORMAL LOW (ref 12.0–15.0)
Immature Granulocytes: 1 %
Lymphocytes Relative: 42 %
Lymphs Abs: 1.6 10*3/uL (ref 0.7–4.0)
MCH: 31.8 pg (ref 26.0–34.0)
MCHC: 30.9 g/dL (ref 30.0–36.0)
MCV: 102.9 fL — ABNORMAL HIGH (ref 80.0–100.0)
Monocytes Absolute: 0.5 10*3/uL (ref 0.1–1.0)
Monocytes Relative: 13 %
Neutro Abs: 1.5 10*3/uL — ABNORMAL LOW (ref 1.7–7.7)
Neutrophils Relative %: 40 %
Platelet Count: 201 10*3/uL (ref 150–400)
RBC: 3.46 MIL/uL — ABNORMAL LOW (ref 3.87–5.11)
RDW: 16 % — ABNORMAL HIGH (ref 11.5–15.5)
WBC Count: 3.7 10*3/uL — ABNORMAL LOW (ref 4.0–10.5)
nRBC: 0 % (ref 0.0–0.2)

## 2019-09-25 LAB — CMP (CANCER CENTER ONLY)
ALT: 11 U/L (ref 0–44)
AST: 14 U/L — ABNORMAL LOW (ref 15–41)
Albumin: 4.4 g/dL (ref 3.5–5.0)
Alkaline Phosphatase: 60 U/L (ref 38–126)
Anion gap: 8 (ref 5–15)
BUN: 38 mg/dL — ABNORMAL HIGH (ref 8–23)
CO2: 26 mmol/L (ref 22–32)
Calcium: 9.5 mg/dL (ref 8.9–10.3)
Chloride: 110 mmol/L (ref 98–111)
Creatinine: 2.32 mg/dL — ABNORMAL HIGH (ref 0.44–1.00)
GFR, Est AFR Am: 23 mL/min — ABNORMAL LOW (ref >60)
GFR, Estimated: 20 mL/min — ABNORMAL LOW (ref >60)
Glucose, Bld: 132 mg/dL — ABNORMAL HIGH (ref 70–99)
Potassium: 4 mmol/L (ref 3.5–5.1)
Sodium: 144 mmol/L (ref 135–145)
Total Bilirubin: 0.4 mg/dL (ref 0.3–1.2)
Total Protein: 7 g/dL (ref 6.5–8.1)

## 2019-09-25 LAB — RETICULOCYTES
Immature Retic Fract: 9.8 % (ref 2.3–15.9)
RBC.: 3.45 MIL/uL — ABNORMAL LOW (ref 3.87–5.11)
Retic Count, Absolute: 53.5 10*3/uL (ref 19.0–186.0)
Retic Ct Pct: 1.6 % (ref 0.4–3.1)

## 2019-09-25 NOTE — Progress Notes (Signed)
HGB-11.0 from today's CBC. Jory Ee NP notified and order received to hold Aranesp today.  Pt without complaints at this time.

## 2019-09-26 LAB — FERRITIN: Ferritin: 69 ng/mL (ref 11–307)

## 2019-09-26 LAB — IRON AND TIBC
Iron: 73 ug/dL (ref 41–142)
Saturation Ratios: 24 % (ref 21–57)
TIBC: 303 ug/dL (ref 236–444)
UIBC: 230 ug/dL (ref 120–384)

## 2019-09-27 DIAGNOSIS — M5136 Other intervertebral disc degeneration, lumbar region: Secondary | ICD-10-CM | POA: Diagnosis not present

## 2019-09-27 DIAGNOSIS — M150 Primary generalized (osteo)arthritis: Secondary | ICD-10-CM | POA: Diagnosis not present

## 2019-09-27 DIAGNOSIS — M0589 Other rheumatoid arthritis with rheumatoid factor of multiple sites: Secondary | ICD-10-CM | POA: Diagnosis not present

## 2019-09-27 DIAGNOSIS — N184 Chronic kidney disease, stage 4 (severe): Secondary | ICD-10-CM | POA: Diagnosis not present

## 2019-09-27 DIAGNOSIS — E669 Obesity, unspecified: Secondary | ICD-10-CM | POA: Diagnosis not present

## 2019-09-27 DIAGNOSIS — D860 Sarcoidosis of lung: Secondary | ICD-10-CM | POA: Diagnosis not present

## 2019-09-27 DIAGNOSIS — Z6835 Body mass index (BMI) 35.0-35.9, adult: Secondary | ICD-10-CM | POA: Diagnosis not present

## 2019-10-02 ENCOUNTER — Other Ambulatory Visit: Payer: Self-pay

## 2019-10-02 MED ORDER — FAMOTIDINE 40 MG PO TABS: ORAL_TABLET | ORAL | 1 refills | Status: DC

## 2019-10-09 ENCOUNTER — Other Ambulatory Visit: Payer: Self-pay

## 2019-10-09 ENCOUNTER — Ambulatory Visit (AMBULATORY_SURGERY_CENTER): Payer: Self-pay

## 2019-10-09 VITALS — Ht 64.0 in | Wt 205.0 lb

## 2019-10-09 DIAGNOSIS — Z1211 Encounter for screening for malignant neoplasm of colon: Secondary | ICD-10-CM

## 2019-10-09 NOTE — Progress Notes (Signed)
No egg or soy allergy known to patient  No issues with past sedation with any surgeries  or procedures, no intubation problems  No diet pills per patient No home 02 use per patient  No blood thinners per patient   Pt STATES issues with constipation, 2 DAY MIRALAX PREP ORDERED.  No A fib or A flutter  EMMI video sent to pt's e mail   Pt has completed covid vaccine series  Due to the COVID-19 pandemic we are asking patients to follow these guidelines. Please only bring one care partner. Please be aware that your care partner may wait in the car in the parking lot or if they feel like they will be too hot to wait in the car, they may wait in the lobby on the 4th floor. All care partners are required to wear a mask the entire time (we do not have any that we can provide them), they need to practice social distancing, and we will do a Covid check for all patient's and care partners when you arrive. Also we will check their temperature and your temperature. If the care partner waits in their car they need to stay in the parking lot the entire time and we will call them on their cell phone when the patient is ready for discharge so they can bring the car to the front of the building. Also all patient's will need to wear a mask into building.

## 2019-10-10 DIAGNOSIS — M5415 Radiculopathy, thoracolumbar region: Secondary | ICD-10-CM | POA: Diagnosis not present

## 2019-10-16 ENCOUNTER — Inpatient Hospital Stay (HOSPITAL_BASED_OUTPATIENT_CLINIC_OR_DEPARTMENT_OTHER): Payer: Medicare Other | Admitting: Family

## 2019-10-16 ENCOUNTER — Inpatient Hospital Stay: Payer: Medicare Other

## 2019-10-16 ENCOUNTER — Other Ambulatory Visit: Payer: Self-pay

## 2019-10-16 ENCOUNTER — Inpatient Hospital Stay: Payer: Medicare Other | Attending: Hematology & Oncology

## 2019-10-16 VITALS — BP 140/72 | HR 83 | Temp 96.9°F | Resp 18 | Wt 206.0 lb

## 2019-10-16 DIAGNOSIS — D631 Anemia in chronic kidney disease: Secondary | ICD-10-CM

## 2019-10-16 DIAGNOSIS — Z7951 Long term (current) use of inhaled steroids: Secondary | ICD-10-CM | POA: Insufficient documentation

## 2019-10-16 DIAGNOSIS — I129 Hypertensive chronic kidney disease with stage 1 through stage 4 chronic kidney disease, or unspecified chronic kidney disease: Secondary | ICD-10-CM | POA: Diagnosis not present

## 2019-10-16 DIAGNOSIS — R5383 Other fatigue: Secondary | ICD-10-CM | POA: Insufficient documentation

## 2019-10-16 DIAGNOSIS — N183 Chronic kidney disease, stage 3 unspecified: Secondary | ICD-10-CM | POA: Diagnosis not present

## 2019-10-16 DIAGNOSIS — D472 Monoclonal gammopathy: Secondary | ICD-10-CM | POA: Insufficient documentation

## 2019-10-16 DIAGNOSIS — N182 Chronic kidney disease, stage 2 (mild): Secondary | ICD-10-CM

## 2019-10-16 DIAGNOSIS — Z79899 Other long term (current) drug therapy: Secondary | ICD-10-CM | POA: Insufficient documentation

## 2019-10-16 DIAGNOSIS — D508 Other iron deficiency anemias: Secondary | ICD-10-CM

## 2019-10-16 DIAGNOSIS — R0609 Other forms of dyspnea: Secondary | ICD-10-CM | POA: Diagnosis not present

## 2019-10-16 DIAGNOSIS — D509 Iron deficiency anemia, unspecified: Secondary | ICD-10-CM | POA: Diagnosis not present

## 2019-10-16 DIAGNOSIS — Z7982 Long term (current) use of aspirin: Secondary | ICD-10-CM | POA: Diagnosis not present

## 2019-10-16 DIAGNOSIS — E611 Iron deficiency: Secondary | ICD-10-CM | POA: Insufficient documentation

## 2019-10-16 LAB — CMP (CANCER CENTER ONLY)
ALT: 13 U/L (ref 0–44)
AST: 16 U/L (ref 15–41)
Albumin: 4.3 g/dL (ref 3.5–5.0)
Alkaline Phosphatase: 63 U/L (ref 38–126)
Anion gap: 9 (ref 5–15)
BUN: 41 mg/dL — ABNORMAL HIGH (ref 8–23)
CO2: 27 mmol/L (ref 22–32)
Calcium: 9.7 mg/dL (ref 8.9–10.3)
Chloride: 107 mmol/L (ref 98–111)
Creatinine: 2.58 mg/dL — ABNORMAL HIGH (ref 0.44–1.00)
GFR, Est AFR Am: 21 mL/min — ABNORMAL LOW (ref >60)
GFR, Estimated: 18 mL/min — ABNORMAL LOW (ref >60)
Glucose, Bld: 110 mg/dL — ABNORMAL HIGH (ref 70–99)
Potassium: 4.5 mmol/L (ref 3.5–5.1)
Sodium: 143 mmol/L (ref 135–145)
Total Bilirubin: 0.4 mg/dL (ref 0.3–1.2)
Total Protein: 7.4 g/dL (ref 6.5–8.1)

## 2019-10-16 LAB — CBC WITH DIFFERENTIAL (CANCER CENTER ONLY)
Abs Immature Granulocytes: 0.02 10*3/uL (ref 0.00–0.07)
Basophils Absolute: 0 10*3/uL (ref 0.0–0.1)
Basophils Relative: 1 %
Eosinophils Absolute: 0.1 10*3/uL (ref 0.0–0.5)
Eosinophils Relative: 3 %
HCT: 32.8 % — ABNORMAL LOW (ref 36.0–46.0)
Hemoglobin: 10.5 g/dL — ABNORMAL LOW (ref 12.0–15.0)
Immature Granulocytes: 1 %
Lymphocytes Relative: 56 %
Lymphs Abs: 2.2 10*3/uL (ref 0.7–4.0)
MCH: 31.6 pg (ref 26.0–34.0)
MCHC: 32 g/dL (ref 30.0–36.0)
MCV: 98.8 fL (ref 80.0–100.0)
Monocytes Absolute: 0.4 10*3/uL (ref 0.1–1.0)
Monocytes Relative: 11 %
Neutro Abs: 1.1 10*3/uL — ABNORMAL LOW (ref 1.7–7.7)
Neutrophils Relative %: 28 %
Platelet Count: 153 10*3/uL (ref 150–400)
RBC: 3.32 MIL/uL — ABNORMAL LOW (ref 3.87–5.11)
RDW: 14.3 % (ref 11.5–15.5)
WBC Count: 3.9 10*3/uL — ABNORMAL LOW (ref 4.0–10.5)
nRBC: 0 % (ref 0.0–0.2)

## 2019-10-16 LAB — RETICULOCYTES
Immature Retic Fract: 11.6 % (ref 2.3–15.9)
RBC.: 3.31 MIL/uL — ABNORMAL LOW (ref 3.87–5.11)
Retic Count, Absolute: 31.1 10*3/uL (ref 19.0–186.0)
Retic Ct Pct: 0.9 % (ref 0.4–3.1)

## 2019-10-16 MED ORDER — SODIUM CHLORIDE 0.9 % IV SOLN: 510.0000 mg | Freq: Once | INTRAVENOUS | Status: DC

## 2019-10-16 MED ORDER — DARBEPOETIN ALFA 300 MCG/0.6ML IJ SOSY
300.0000 ug | PREFILLED_SYRINGE | Freq: Once | INTRAMUSCULAR | Status: AC
  Administered 2019-10-16: 300 ug via SUBCUTANEOUS

## 2019-10-16 MED ORDER — DARBEPOETIN ALFA 300 MCG/0.6ML IJ SOSY
PREFILLED_SYRINGE | INTRAMUSCULAR | Status: AC
  Filled 2019-10-16: qty 0.6

## 2019-10-16 NOTE — Patient Instructions (Signed)
Darbepoetin Alfa injection What is this medicine? DARBEPOETIN ALFA (dar be POE e tin AL fa) helps your body make more red blood cells. It is used to treat anemia caused by chronic kidney failure and chemotherapy. This medicine may be used for other purposes; ask your health care provider or pharmacist if you have questions. COMMON BRAND NAME(S): Aranesp What should I tell my health care provider before I take this medicine? They need to know if you have any of these conditions:  blood clotting disorders or history of blood clots  cancer patient not on chemotherapy  cystic fibrosis  heart disease, such as angina, heart failure, or a history of a heart attack  hemoglobin level of 12 g/dL or greater  high blood pressure  low levels of folate, iron, or vitamin B12  seizures  an unusual or allergic reaction to darbepoetin, erythropoietin, albumin, hamster proteins, latex, other medicines, foods, dyes, or preservatives  pregnant or trying to get pregnant  breast-feeding How should I use this medicine? This medicine is for injection into a vein or under the skin. It is usually given by a health care professional in a hospital or clinic setting. If you get this medicine at home, you will be taught how to prepare and give this medicine. Use exactly as directed. Take your medicine at regular intervals. Do not take your medicine more often than directed. It is important that you put your used needles and syringes in a special sharps container. Do not put them in a trash can. If you do not have a sharps container, call your pharmacist or healthcare provider to get one. A special MedGuide will be given to you by the pharmacist with each prescription and refill. Be sure to read this information carefully each time. Talk to your pediatrician regarding the use of this medicine in children. While this medicine may be used in children as young as 1 month of age for selected conditions, precautions do  apply. Overdosage: If you think you have taken too much of this medicine contact a poison control center or emergency room at once. NOTE: This medicine is only for you. Do not share this medicine with others. What if I miss a dose? If you miss a dose, take it as soon as you can. If it is almost time for your next dose, take only that dose. Do not take double or extra doses. What may interact with this medicine? Do not take this medicine with any of the following medications:  epoetin alfa This list may not describe all possible interactions. Give your health care provider a list of all the medicines, herbs, non-prescription drugs, or dietary supplements you use. Also tell them if you smoke, drink alcohol, or use illegal drugs. Some items may interact with your medicine. What should I watch for while using this medicine? Your condition will be monitored carefully while you are receiving this medicine. You may need blood work done while you are taking this medicine. This medicine may cause a decrease in vitamin B6. You should make sure that you get enough vitamin B6 while you are taking this medicine. Discuss the foods you eat and the vitamins you take with your health care professional. What side effects may I notice from receiving this medicine? Side effects that you should report to your doctor or health care professional as soon as possible:  allergic reactions like skin rash, itching or hives, swelling of the face, lips, or tongue  breathing problems  changes in   vision  chest pain  confusion, trouble speaking or understanding  feeling faint or lightheaded, falls  high blood pressure  muscle aches or pains  pain, swelling, warmth in the leg  rapid weight gain  severe headaches  sudden numbness or weakness of the face, arm or leg  trouble walking, dizziness, loss of balance or coordination  seizures (convulsions)  swelling of the ankles, feet, hands  unusually weak or  tired Side effects that usually do not require medical attention (report to your doctor or health care professional if they continue or are bothersome):  diarrhea  fever, chills (flu-like symptoms)  headaches  nausea, vomiting  redness, stinging, or swelling at site where injected This list may not describe all possible side effects. Call your doctor for medical advice about side effects. You may report side effects to FDA at 1-800-FDA-1088. Where should I keep my medicine? Keep out of the reach of children. Store in a refrigerator between 2 and 8 degrees C (36 and 46 degrees F). Do not freeze. Do not shake. Throw away any unused portion if using a single-dose vial. Throw away any unused medicine after the expiration date. NOTE: This sheet is a summary. It may not cover all possible information. If you have questions about this medicine, talk to your doctor, pharmacist, or health care provider.  2020 Elsevier/Gold Standard (2017-05-18 16:44:20)  

## 2019-10-16 NOTE — Progress Notes (Signed)
Hematology and Oncology Follow Up Visit  Tina Molina [id number] [birth year] 74 y.o. 10/16/2019   Principle Diagnosis:  Anemia of chronic renal failure - stage II MGUS - IgA lambda Iron deficiency anemia  Current Therapy: Aranesp 300 mcg SQ to keep Hgb > 11 IV iron as indicated   Interim History:  Tina Molina is here today for follow-up. She is doing well but still has some fatigue at times. She has SOB occasionally with over exertion.  Hgb today is 10.5.  She has not noted any blood loss. No bruising or petechiae.  No fever, chills, n/v, cough, rash, dizziness, SOB, chest pain, palpitations, abdominal pain or changes in bowel or bladder habits.  She is scheduled for her routine colonoscopy next week.  No swelling, tenderness, numbness or tingling in her extremities.  No falls or syncope.  She has maintained a good appetite and is staying well hydrated. Her weight is stable.   ECOG Performance Status: 1 - Symptomatic but completely ambulatory  Medications:  Allergies as of 10/16/2019      Reactions   Penicillins Other (See Comments)   Intolerance or allergy not remembered; her sister stated penicillin caused "boils under the skin"   Shellfish Allergy Rash   Shellfish-derived Products Anaphylaxis   Codeine Nausea Only   Nausea only with liquid codeine   Infliximab Other (See Comments)    Confusion (intolerance)   Sulfonamide Derivatives Rash   Azithromycin Itching      Medication List       Accurate as of October 16, 2019  2:23 PM. If you have any questions, ask your nurse or doctor.        aspirin 81 MG tablet Take 81 mg by mouth daily.   atorvastatin 20 MG tablet Commonly known as: LIPITOR TAKE 1 TABLET(20 MG) BY MOUTH DAILY   carvedilol 25 MG tablet Commonly known as: COREG TAKE 1 TABLET(25 MG) BY MOUTH TWICE DAILY   cyclobenzaprine 10 MG tablet Commonly known as: FLEXERIL Take 10 mg by mouth 3 (three) times daily as needed for muscle spasms.     famotidine 40 MG tablet Commonly known as: PEPCID TAKE 1 TABLET(40 MG) BY MOUTH TWICE DAILY   furosemide 20 MG tablet Commonly known as: LASIX Take 20 mg by mouth daily.   gabapentin 300 MG capsule Commonly known as: NEURONTIN TAKE 1 CAPSULE BY MOUTH EVERY 8 HOURS AS NEEDED   hydrALAZINE 50 MG tablet Commonly known as: APRESOLINE TAKE 1 TABLET(50 MG) BY MOUTH THREE TIMES DAILY   leflunomide 20 MG tablet Commonly known as: ARAVA Take 20 mg by mouth daily.   levothyroxine 175 MCG tablet Commonly known as: SYNTHROID Take 1 tablet (175 mcg total) by mouth daily before breakfast.   Lumigan 0.01 % Soln Generic drug: bimatoprost INSTILL 1 DROP INTO BOTH EYES ONCE DAILY AT BEDTIME   bimatoprost 0.03 % ophthalmic solution Commonly known as: LUMIGAN 1 drop at bedtime.   MELATONIN GUMMIES PO Take by mouth at bedtime as needed.   multivitamin with minerals Tabs tablet Take 1 tablet by mouth daily.   spironolactone 50 MG tablet Commonly known as: ALDACTONE TAKE 1/2 TABLET BY MOUTH ONCE DAILY   Symbicort 160-4.5 MCG/ACT inhaler Generic drug: budesonide-formoterol inhale 2 puffs if needed for wheezing   telmisartan 40 MG tablet Commonly known as: MICARDIS Take 40 mg by mouth daily. Taking half a tablet   vitamin E 180 MG (400 UNITS) capsule Take 400 Units by mouth daily.  Allergies:  Allergies  Allergen Reactions  . Penicillins Other (See Comments)    Intolerance or allergy not remembered; her sister stated penicillin caused "boils under the skin"  . Shellfish Allergy Rash  . Shellfish-Derived Products Anaphylaxis  . Codeine Nausea Only    Nausea only with liquid codeine  . Infliximab Other (See Comments)     Confusion (intolerance)  . Sulfonamide Derivatives Rash  . Azithromycin Itching    Past Medical History, Surgical history, Social history, and Family History were reviewed and updated.  Review of Systems: All other 10 point review of systems is  negative.   Physical Exam:  weight is 206 lb (93.4 kg). Her temporal temperature is 96.9 F (36.1 C) (abnormal). Her blood pressure is 140/72 and her pulse is 83. Her respiration is 18 and oxygen saturation is 100%.   Wt Readings from Last 3 Encounters:  10/16/19 206 lb (93.4 kg)  10/09/19 205 lb (93 kg)  09/04/19 202 lb (91.6 kg)    Ocular: Sclerae unicteric, pupils equal, round and reactive to light Ear-nose-throat: Oropharynx clear, dentition fair Lymphatic: No cervical or supraclavicular adenopathy Lungs no rales or rhonchi, good excursion bilaterally Heart regular rate and rhythm, no murmur appreciated Abd soft, nontender, positive bowel sounds, no liver or spleen tip palpated on exam, no fluid wave  MSK no focal spinal tenderness, no joint edema Neuro: non-focal, well-oriented, appropriate affect Breasts: Deferred   Lab Results  Component Value Date   WBC 3.9 (L) 10/16/2019   HGB 10.5 (L) 10/16/2019   HCT 32.8 (L) 10/16/2019   MCV 98.8 10/16/2019   PLT 153 10/16/2019   Lab Results  Component Value Date   FERRITIN 69 09/25/2019   IRON 73 09/25/2019   TIBC 303 09/25/2019   UIBC 230 09/25/2019   IRONPCTSAT 24 09/25/2019   Lab Results  Component Value Date   RETICCTPCT 0.9 10/16/2019   RBC 3.32 (L) 10/16/2019   RBC 3.31 (L) 10/16/2019   RETICCTABS 51.6 01/03/2013   Lab Results  Component Value Date   KPAFRELGTCHN 28.6 (H) 07/20/2019   LAMBDASER 43.7 (H) 07/20/2019   KAPLAMBRATIO 0.65 07/20/2019   Lab Results  Component Value Date   IGGSERUM 1,091 07/20/2019   IGA 516 (H) 07/20/2019   IGMSERUM 31 07/20/2019   Lab Results  Component Value Date   TOTALPROTELP 6.7 07/20/2019   ALBUMINELP 3.8 07/20/2019   A1GS 0.2 07/20/2019   A2GS 0.6 07/20/2019   BETS 1.2 07/20/2019   BETA2SER 10.3 (H) 11/04/2011   GAMS 0.9 07/20/2019   MSPIKE Not Observed 07/20/2019   SPEI Comment 07/20/2019     Chemistry      Component Value Date/Time   NA 144 09/25/2019 1340     NA 150 (H) 03/01/2017 1308   NA 143 04/05/2016 1156   K 4.0 09/25/2019 1340   K 4.0 03/01/2017 1308   K 3.8 04/05/2016 1156   CL 110 09/25/2019 1340   CL 109 (H) 03/01/2017 1308   CO2 26 09/25/2019 1340   CO2 28 03/01/2017 1308   CO2 21 (L) 04/05/2016 1156   BUN 38 (H) 09/25/2019 1340   BUN 19 03/01/2017 1308   BUN 18.5 04/05/2016 1156   CREATININE 2.32 (H) 09/25/2019 1340   CREATININE 1.8 (H) 03/01/2017 1308   CREATININE 1.5 (H) 04/05/2016 1156      Component Value Date/Time   CALCIUM 9.5 09/25/2019 1340   CALCIUM 9.7 03/01/2017 1308   CALCIUM 9.3 04/05/2016 1156   ALKPHOS 60 09/25/2019  1340   ALKPHOS 55 03/01/2017 1308   ALKPHOS 77 04/05/2016 1156   AST 14 (L) 09/25/2019 1340   AST 21 04/05/2016 1156   ALT 11 09/25/2019 1340   ALT 18 03/01/2017 1308   ALT 22 04/05/2016 1156   BILITOT 0.4 09/25/2019 1340   BILITOT 0.36 04/05/2016 1156       Impression and Plan: Ms. Fiske is a very pleasant 43yoAfrican American female with multifactorial anemia as well as an IgA lambda MGUS. Aranesp given for Hgb 10.5.  We will see what her iron studies look like and replace next week if needed.  We willsee her againin another 3 weeks for lab and injection at [facility] per her request and then follow-up in 6 weeks.  She is in agreement and will contact our office with any questions or concerns.We can certainly see her sooner if needed.  Laverna Peace, NP 6/1/20212:23 PM

## 2019-10-17 ENCOUNTER — Telehealth: Payer: Self-pay | Admitting: Family

## 2019-10-17 ENCOUNTER — Other Ambulatory Visit (INDEPENDENT_AMBULATORY_CARE_PROVIDER_SITE_OTHER): Payer: Medicare Other

## 2019-10-17 ENCOUNTER — Other Ambulatory Visit: Payer: Self-pay

## 2019-10-17 DIAGNOSIS — E890 Postprocedural hypothyroidism: Secondary | ICD-10-CM

## 2019-10-17 LAB — IRON AND TIBC
Iron: 113 ug/dL (ref 28–170)
Saturation Ratios: 34 % — ABNORMAL HIGH (ref 10.4–31.8)
TIBC: 334 ug/dL (ref 250–450)
UIBC: 221 ug/dL

## 2019-10-17 LAB — TSH: TSH: 4.64 u[IU]/mL — ABNORMAL HIGH (ref 0.35–4.50)

## 2019-10-17 LAB — FERRITIN: Ferritin: 144 ng/mL (ref 11–307)

## 2019-10-17 NOTE — Telephone Encounter (Signed)
Appointments scheduled calendar printed & mailed per 6/1 los 

## 2019-10-18 ENCOUNTER — Other Ambulatory Visit: Payer: Self-pay | Admitting: Internal Medicine

## 2019-10-18 MED ORDER — LEVOTHYROXINE SODIUM 175 MCG PO TABS: ORAL_TABLET | ORAL | 1 refills | Status: DC

## 2019-10-22 ENCOUNTER — Telehealth: Payer: Self-pay

## 2019-10-22 ENCOUNTER — Other Ambulatory Visit: Payer: Self-pay | Admitting: Internal Medicine

## 2019-10-22 NOTE — Telephone Encounter (Signed)
New message    Calling for test results  

## 2019-10-23 ENCOUNTER — Encounter: Payer: Medicare Other | Admitting: Gastroenterology

## 2019-10-23 NOTE — Telephone Encounter (Signed)
Results given.

## 2019-10-25 ENCOUNTER — Encounter: Payer: Self-pay | Admitting: Certified Registered Nurse Anesthetist

## 2019-10-26 ENCOUNTER — Telehealth: Payer: Self-pay | Admitting: Internal Medicine

## 2019-10-26 ENCOUNTER — Encounter: Payer: Self-pay | Admitting: Gastroenterology

## 2019-10-26 ENCOUNTER — Other Ambulatory Visit: Payer: Self-pay

## 2019-10-26 ENCOUNTER — Ambulatory Visit (AMBULATORY_SURGERY_CENTER): Payer: Medicare Other | Admitting: Gastroenterology

## 2019-10-26 VITALS — BP 113/64 | HR 81 | Temp 95.9°F | Resp 21 | Ht 64.0 in | Wt 205.0 lb

## 2019-10-26 DIAGNOSIS — K640 First degree hemorrhoids: Secondary | ICD-10-CM

## 2019-10-26 DIAGNOSIS — Z1211 Encounter for screening for malignant neoplasm of colon: Secondary | ICD-10-CM | POA: Diagnosis not present

## 2019-10-26 DIAGNOSIS — I129 Hypertensive chronic kidney disease with stage 1 through stage 4 chronic kidney disease, or unspecified chronic kidney disease: Secondary | ICD-10-CM | POA: Diagnosis not present

## 2019-10-26 DIAGNOSIS — K644 Residual hemorrhoidal skin tags: Secondary | ICD-10-CM

## 2019-10-26 DIAGNOSIS — N184 Chronic kidney disease, stage 4 (severe): Secondary | ICD-10-CM | POA: Diagnosis not present

## 2019-10-26 MED ORDER — SODIUM CHLORIDE 0.9 % IV SOLN: 500.0000 mL | INTRAVENOUS | Status: DC

## 2019-10-26 MED ORDER — GABAPENTIN 300 MG PO CAPS: 300.0000 mg | ORAL_CAPSULE | Freq: Every day | ORAL | 0 refills | Status: DC

## 2019-10-26 MED ORDER — LEVOTHYROXINE SODIUM 175 MCG PO TABS: ORAL_TABLET | ORAL | 1 refills | Status: DC

## 2019-10-26 NOTE — Telephone Encounter (Signed)
Erx has been sent as requested  

## 2019-10-26 NOTE — Telephone Encounter (Signed)
New message:    1.Medication Requested: gabapentin (NEURONTIN) 300 MG capsule levothyroxine (SYNTHROID) 175 MCG tablet 2. Pharmacy (Name, Street, [place]): [facility] [phone] - [place], [address]. On Med List: Yes  4. Last Visit with PCP: 08/14/19  5. Next visit date with PCP: 02/15/20   Pt states the levothyroxine was supposed to be sent to the pharmacy for the pt a couple of days ago but pharmacy states they have not received it. Please advise.  Agent: Please be advised that RX refills may take up to 3 business days. We ask that you follow-up with your pharmacy.

## 2019-10-26 NOTE — Progress Notes (Signed)
Report given to PACU, vss 

## 2019-10-26 NOTE — Patient Instructions (Signed)
Impression/Recommendations:  Hemorrhoid handout given to patient.  Resume previous diet. Continue present medications.Repeat colonoscopy not recommended for screening purposes.  Return to GI office as needed.  Use fiber supplement such as Citrucel, Fibercon, Konsyl or Metamucil.  YOU HAD AN ENDOSCOPIC PROCEDURE TODAY AT [facility]:   Refer to the procedure report that was given to you for any specific questions about what was found during the examination.  If the procedure report does not answer your questions, please call your gastroenterologist to clarify.  If you requested that your care partner not be given the details of your procedure findings, then the procedure report has been included in a sealed envelope for you to review at your convenience later.  YOU SHOULD EXPECT: Some feelings of bloating in the abdomen. Passage of more gas than usual.  Walking can help get rid of the air that was put into your GI tract during the procedure and reduce the bloating. If you had a lower endoscopy (such as a colonoscopy or flexible sigmoidoscopy) you may notice spotting of blood in your stool or on the toilet paper. If you underwent a bowel prep for your procedure, you may not have a normal bowel movement for a few days.  Please Note:  You might notice some irritation and congestion in your nose or some drainage.  This is from the oxygen used during your procedure.  There is no need for concern and it should clear up in a day or so.  SYMPTOMS TO REPORT IMMEDIATELY:   Following lower endoscopy (colonoscopy or flexible sigmoidoscopy):  Excessive amounts of blood in the stool  Significant tenderness or worsening of abdominal pains  Swelling of the abdomen that is new, acute  Fever of 100F or higher For urgent or emergent issues, a gastroenterologist can be reached at any hour by calling [phone]. Do not use MyChart messaging for urgent concerns.    DIET:  We do recommend  a small meal at first, but then you may proceed to your regular diet.  Drink plenty of fluids but you should avoid alcoholic beverages for 24 hours.  ACTIVITY:  You should plan to take it easy for the rest of today and you should NOT DRIVE or use heavy machinery until tomorrow (because of the sedation medicines used during the test).    FOLLOW UP: Our staff will call the number listed on your records 48-72 hours following your procedure to check on you and address any questions or concerns that you may have regarding the information given to you following your procedure. If we do not reach you, we will leave a message.  We will attempt to reach you two times.  During this call, we will ask if you have developed any symptoms of COVID 19. If you develop any symptoms (ie: fever, flu-like symptoms, shortness of breath, cough etc.) before then, please call [phone].  If you test positive for Covid 19 in the 2 weeks post procedure, please call and report this information to [place].    If any biopsies were taken you will be contacted by phone or by letter within the next 1-3 weeks.  Please call us at [phone] if you have not heard about the biopsies in 3 weeks.    SIGNATURES/CONFIDENTIALITY: You and/or your care partner have signed paperwork which will be entered into your electronic medical record.  These signatures attest to the fact that that the information above on your After Visit Summary has been reviewed  and is understood.  Full responsibility of the confidentiality of this discharge information lies with you and/or your care-partner. 

## 2019-10-26 NOTE — Op Note (Signed)
[facility] Patient Name: Tina Molina Procedure Date: 10/26/2019 8:38 AM MRN: [id number] Endoscopist: Gerrit Heck , MD Age: 74 Referring MD:  Date of Birth: 1945/07/19 Gender: Female Account #: [id number] Procedure:                Colonoscopy Indications:              Screening for colorectal malignant neoplasm (last                            colonoscopy was more than 10 years ago)                           Last colonoscopy was in 2009 and only notable for                            hemorrhoids, with recommendation to repeat in 10                            years. Otherwise, no active GI symptoms. Medicines:                Monitored Anesthesia Care Procedure:                Pre-Anesthesia Assessment:                           - Prior to the procedure, a History and Physical                            was performed, and patient medications and                            allergies were reviewed. The patient's tolerance of                            previous anesthesia was also reviewed. The risks                            and benefits of the procedure and the sedation                            options and risks were discussed with the patient.                            All questions were answered, and informed consent                            was obtained. Prior Anticoagulants: The patient has                            taken no previous anticoagulant or antiplatelet                            agents. ASA Grade Assessment: III - A patient with  severe systemic disease. After reviewing the risks                            and benefits, the patient was deemed in                            satisfactory condition to undergo the procedure.                           After obtaining informed consent, the colonoscope                            was passed under direct vision. Throughout the                            procedure, the patient's blood  pressure, pulse, and                            oxygen saturations were monitored continuously. The                            Colonoscope was introduced through the anus and                            advanced to the the terminal ileum. The colonoscopy                            was performed without difficulty. The patient                            tolerated the procedure well. The quality of the                            bowel preparation was good. The terminal ileum,                            ileocecal valve, appendiceal orifice, and rectum                            were photographed. Scope In: 8:49:51 AM Scope Out: 9:06:08 AM Scope Withdrawal Time: 0 hours 11 minutes 45 seconds  Total Procedure Duration: 0 hours 16 minutes 17 seconds  Findings:                 Skin tags were found on perianal exam.                           The colon (entire examined portion) appeared normal.                           Non-bleeding internal hemorrhoids were found during                            retroflexion. The hemorrhoids were small and Grade  I (internal hemorrhoids that do not prolapse).                           The terminal ileum appeared normal. Complications:            No immediate complications. Estimated Blood Loss:     Estimated blood loss: none. Impression:               - Perianal skin tags found on perianal exam.                           - The entire examined colon is normal.                           - Non-bleeding internal hemorrhoids.                           - The examined portion of the ileum was normal.                           - No specimens collected. Recommendation:           - Patient has a contact number available for                            emergencies. The signs and symptoms of potential                            delayed complications were discussed with the                            patient. Return to normal activities tomorrow.                             Written discharge instructions were provided to the                            patient.                           - Resume previous diet.                           - Continue present medications.                           - Repeat colonoscopy is not recommended for                            screening purposes due to current age (58 years or                            older).                           - Return to GI office PRN.                           -  Use fiber, for example Citrucel, Fibercon, Konsyl                            or Metamucil. Gerrit Heck, MD 10/26/2019 9:10:19 AM

## 2019-10-30 ENCOUNTER — Telehealth: Payer: Self-pay

## 2019-10-30 NOTE — Telephone Encounter (Signed)
  Follow up Call-  Call back number 10/26/2019  Post procedure Call Back phone  # [phone]  Permission to leave phone message Yes  Some recent data might be hidden     Patient questions:  Do you have a fever, pain , or abdominal swelling? No. Pain Score  0 *  Have you tolerated food without any problems? Yes.    Have you been able to return to your normal activities? Yes.    Do you have any questions about your discharge instructions: Diet   No. Medications  No. Follow up visit  No.  Do you have questions or concerns about your Care? No. Covid-19 screening questions       1. Have you developed a fever since your procedure? no  2.   Have you had an respiratory symptoms (SOB or cough) since your procedure? no  3.   Have you tested positive for COVID 19 since your procedure no  4.   Have you had any family members/close contacts diagnosed with the COVID 19 since your procedure?  no   If yes to any of these questions please route to Joylene John, RN and Erenest Rasher, RN  Actions: * If pain score is 4 or above: No action needed, pain <4.

## 2019-11-06 ENCOUNTER — Inpatient Hospital Stay: Payer: Medicare Other

## 2019-11-06 ENCOUNTER — Other Ambulatory Visit: Payer: Self-pay

## 2019-11-06 ENCOUNTER — Ambulatory Visit: Payer: Medicare Other

## 2019-11-06 ENCOUNTER — Other Ambulatory Visit: Payer: Medicare Other

## 2019-11-06 DIAGNOSIS — D631 Anemia in chronic kidney disease: Secondary | ICD-10-CM

## 2019-11-06 DIAGNOSIS — D508 Other iron deficiency anemias: Secondary | ICD-10-CM

## 2019-11-06 DIAGNOSIS — R5383 Other fatigue: Secondary | ICD-10-CM | POA: Diagnosis not present

## 2019-11-06 DIAGNOSIS — D509 Iron deficiency anemia, unspecified: Secondary | ICD-10-CM | POA: Diagnosis not present

## 2019-11-06 DIAGNOSIS — N183 Chronic kidney disease, stage 3 unspecified: Secondary | ICD-10-CM | POA: Diagnosis not present

## 2019-11-06 DIAGNOSIS — D472 Monoclonal gammopathy: Secondary | ICD-10-CM | POA: Diagnosis not present

## 2019-11-06 DIAGNOSIS — I129 Hypertensive chronic kidney disease with stage 1 through stage 4 chronic kidney disease, or unspecified chronic kidney disease: Secondary | ICD-10-CM | POA: Diagnosis not present

## 2019-11-06 LAB — CBC WITH DIFFERENTIAL (CANCER CENTER ONLY)
Abs Immature Granulocytes: 0 10*3/uL (ref 0.00–0.07)
Basophils Absolute: 0 10*3/uL (ref 0.0–0.1)
Basophils Relative: 1 %
Eosinophils Absolute: 0.2 10*3/uL (ref 0.0–0.5)
Eosinophils Relative: 4 %
HCT: 34.6 % — ABNORMAL LOW (ref 36.0–46.0)
Hemoglobin: 11.1 g/dL — ABNORMAL LOW (ref 12.0–15.0)
Immature Granulocytes: 0 %
Lymphocytes Relative: 53 %
Lymphs Abs: 1.8 10*3/uL (ref 0.7–4.0)
MCH: 32.3 pg (ref 26.0–34.0)
MCHC: 32.1 g/dL (ref 30.0–36.0)
MCV: 100.6 fL — ABNORMAL HIGH (ref 80.0–100.0)
Monocytes Absolute: 0.6 10*3/uL (ref 0.1–1.0)
Monocytes Relative: 17 %
Neutro Abs: 0.9 10*3/uL — ABNORMAL LOW (ref 1.7–7.7)
Neutrophils Relative %: 25 %
Platelet Count: 169 10*3/uL (ref 150–400)
RBC: 3.44 MIL/uL — ABNORMAL LOW (ref 3.87–5.11)
RDW: 16.9 % — ABNORMAL HIGH (ref 11.5–15.5)
WBC Count: 3.4 10*3/uL — ABNORMAL LOW (ref 4.0–10.5)
nRBC: 0 % (ref 0.0–0.2)

## 2019-11-06 NOTE — Progress Notes (Signed)
Pt did not receive Aranesp today d/t hgb 11.1 per parameters. Pt provided w/cody of lab results.

## 2019-11-09 IMAGING — US ULTRASOUND LEFT BREAST LIMITED
1 series · 2 of 2 positions shown · non-contrast
Comparison: Previous exam(s).

CLINICAL DATA: 72-year-old female with a physician palpated lump at
the 2 o'clock position on left breast. The patient states she feels
many lumps throughout the left breast for many years, without any
recent changes.

EXAM:
DIGITAL DIAGNOSTIC BILATERAL MAMMOGRAM WITH CAD AND TOMO
ULTRASOUND LEFT BREAST

[Series 1: ultrasound left breast limited · 0.07mm/px · 2 of 2 slices shown]
[im 1/2]
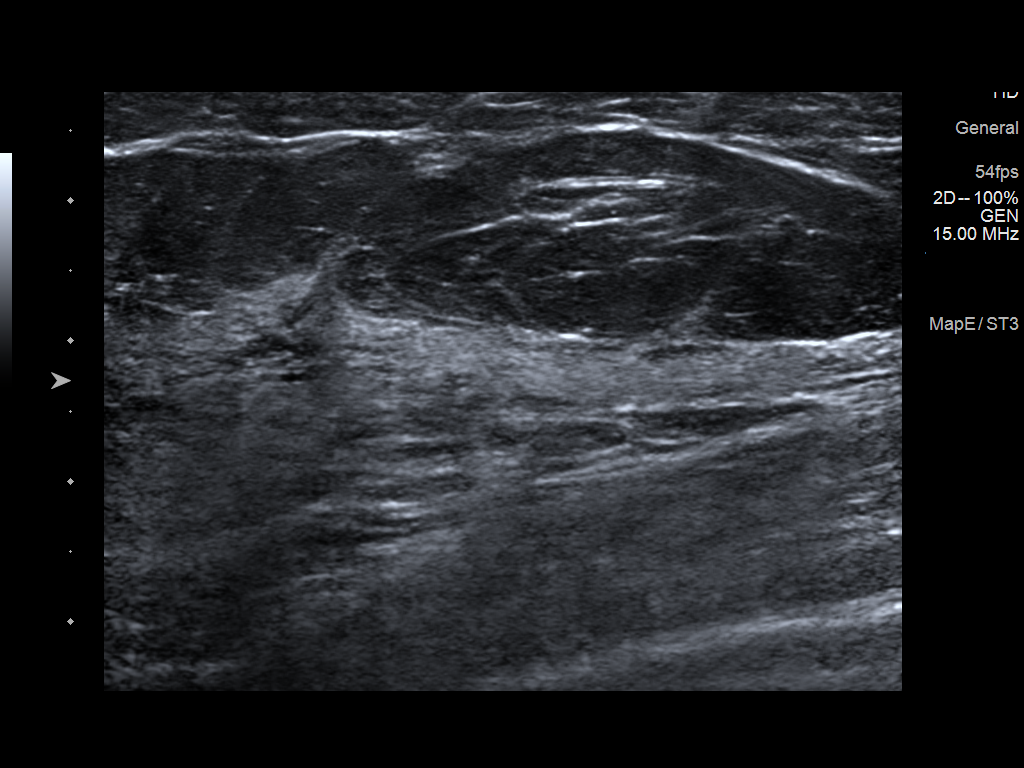
[im 2/2]
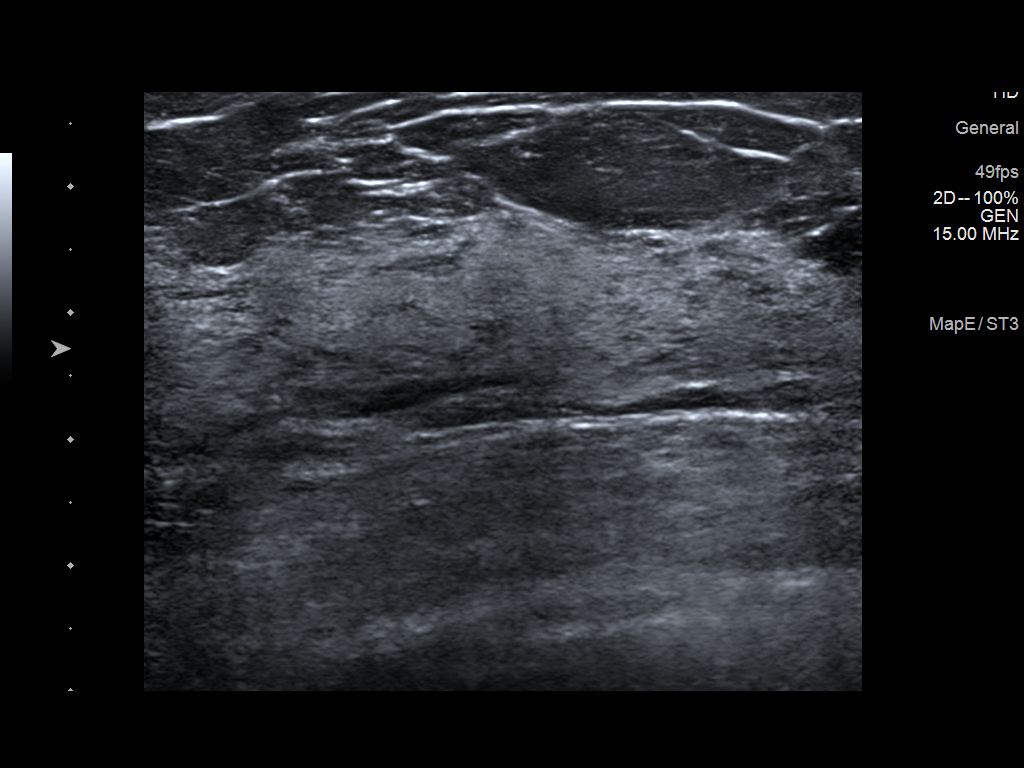

[2 of 2 positions shown; findings below may reference images not displayed]

ACR Breast Density Category c: The breast tissue is heterogeneously
dense, which may obscure small masses.
FINDINGS: No suspicious mammographic findings are identified in either breast.
The parenchymal pattern is stable.

Mammographic images were processed with CAD.

Targeted ultrasound is performed, showing normal fibroglandular
tissue without focal or suspicious sonographic abnormality.
Evaluation of the upper outer left breast was performed.
IMPRESSION: 1. No mammographic evidence of malignancy in either breast.
2. No focal sonographic findings at the site of the physician
palpated left breast lump.

RECOMMENDATION:
1. Clinical follow-up recommended for the palpable area of concern
in the left breast. Any further workup should be based on clinical
grounds.
2.  Screening mammogram in one year.(Code:DU-S-WDA)

I have discussed the findings and recommendations with the patient.
Results were also provided in writing at the conclusion of the
visit. If applicable, a reminder letter will be sent to the patient
regarding the next appointment.

BI-RADS CATEGORY  1: Negative.

## 2019-11-09 IMAGING — MG DIGITAL DIAGNOSTIC BILATERAL MAMMOGRAM WITH TOMO AND CAD
8 series · 8 of 24 positions shown · non-contrast
Comparison: Previous exam(s).

CLINICAL DATA: 72-year-old female with a physician palpated lump at
the 2 o'clock position on left breast. The patient states she feels
many lumps throughout the left breast for many years, without any
recent changes.

EXAM:
DIGITAL DIAGNOSTIC BILATERAL MAMMOGRAM WITH CAD AND TOMO
ULTRASOUND LEFT BREAST

[R MLO synth-2D]
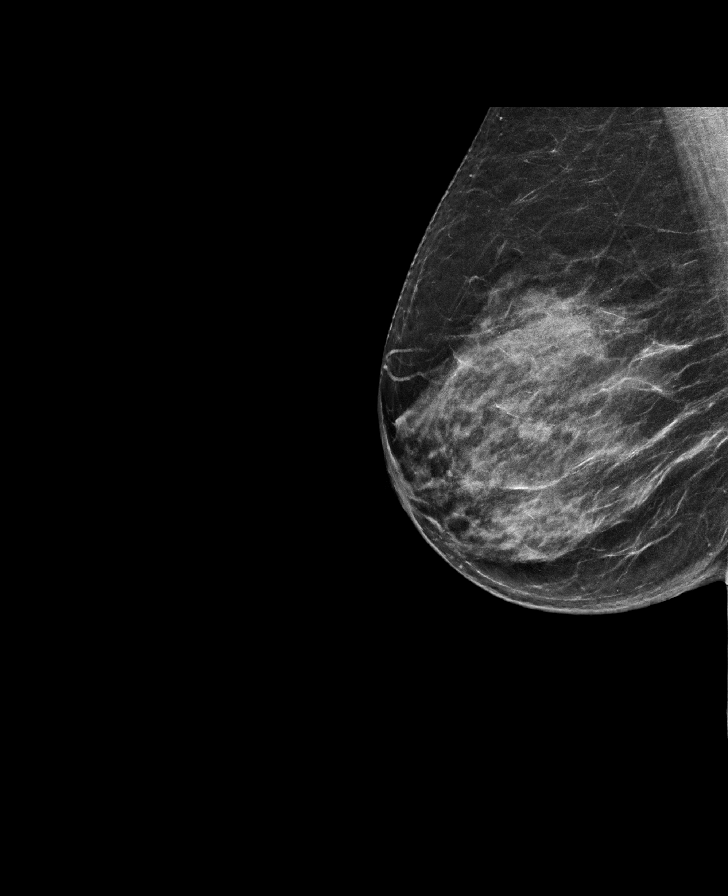

[L MLO synth-2D]
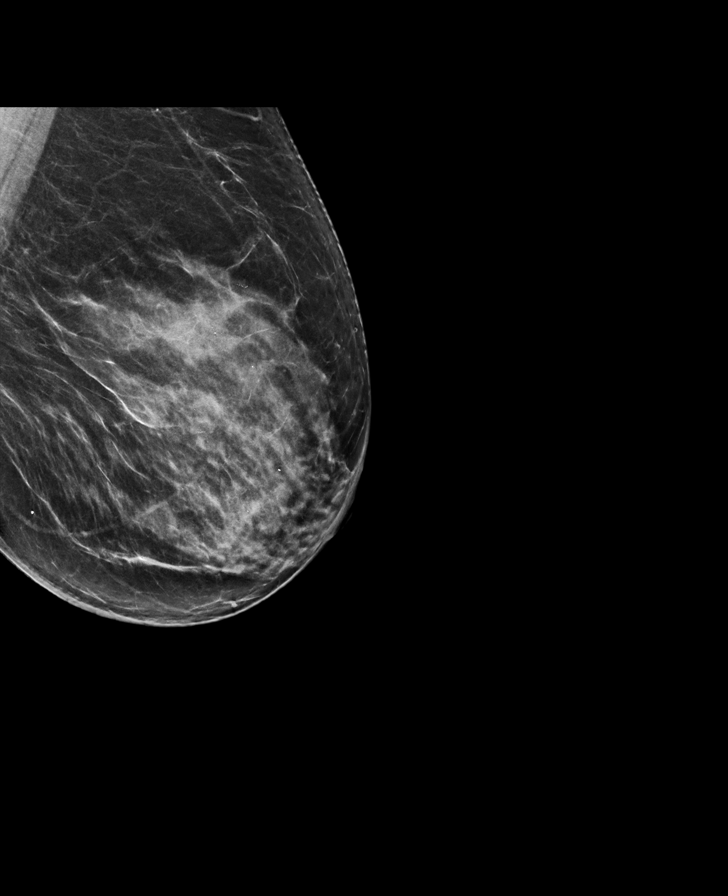

[L CC synth-2D]
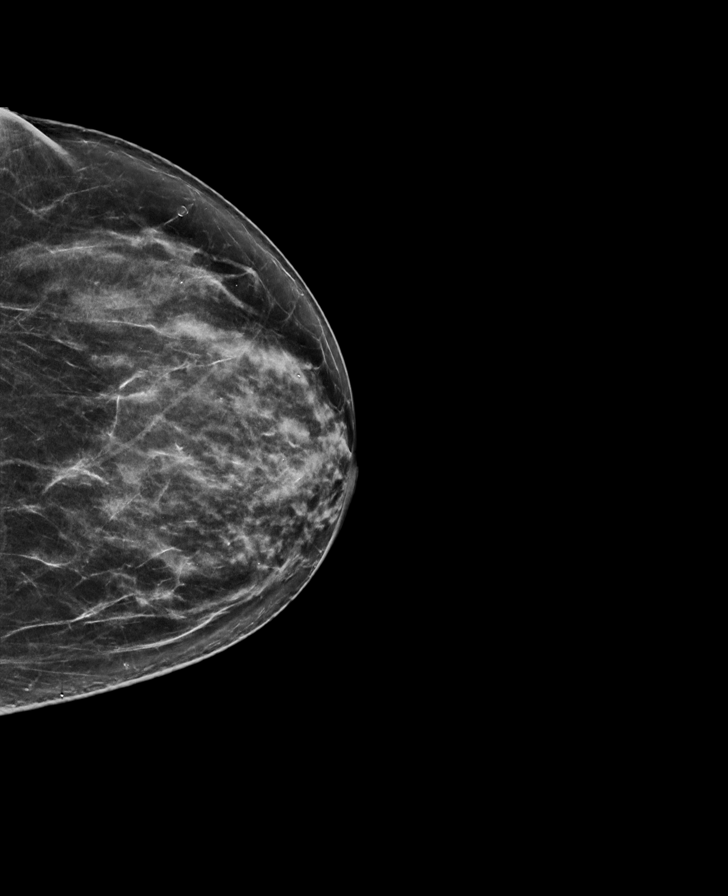

[R CC synth-2D]
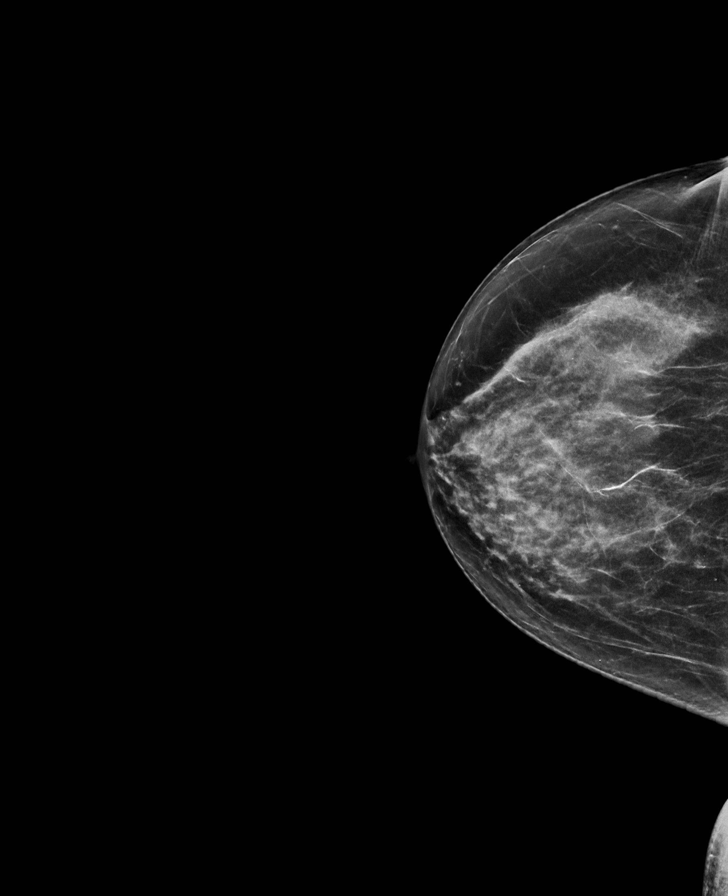

[L CC tomo · tomo slice 38/75.0]
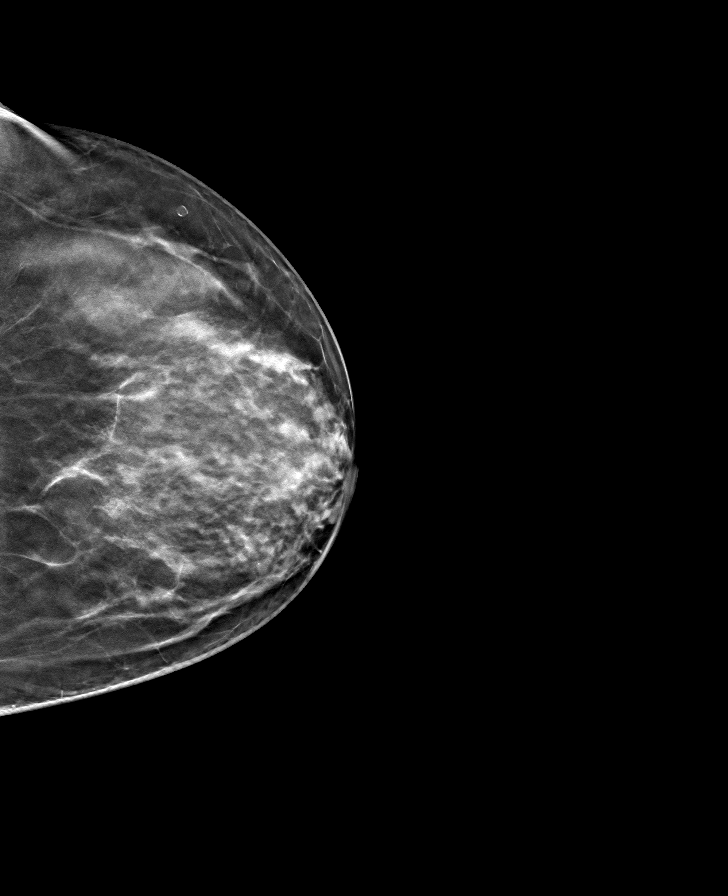

[R CC tomo · tomo slice 35/69.0]
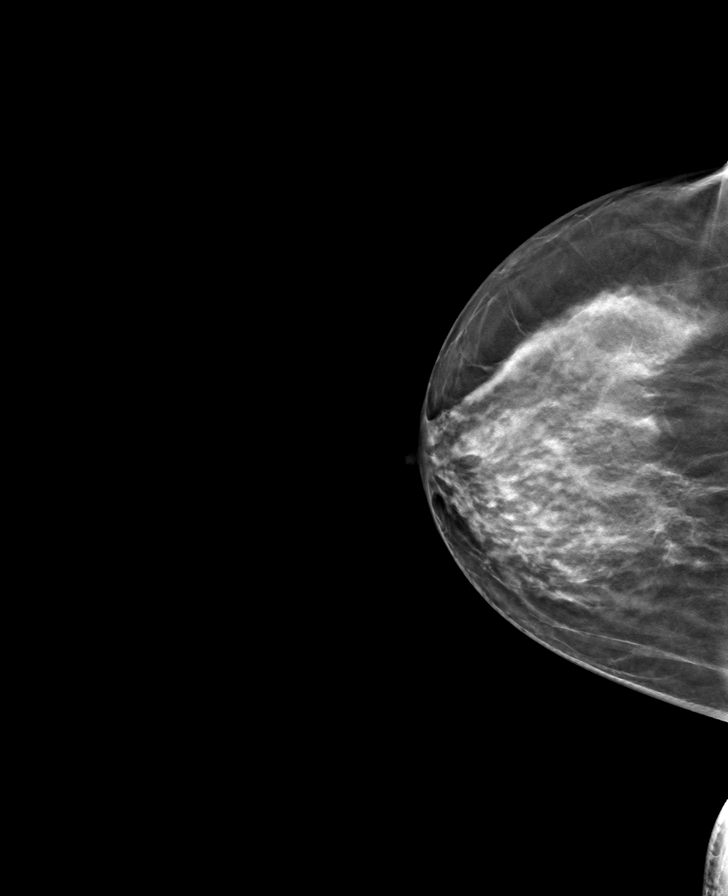

[L MLO tomo · tomo slice 41/81.0]
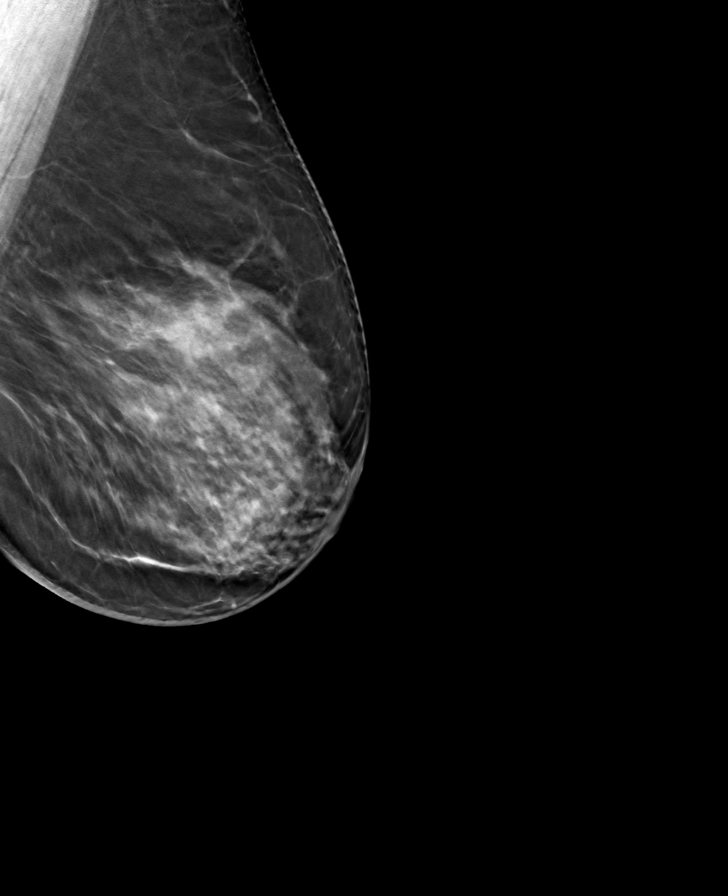

[R MLO tomo · tomo slice 37/72.0]
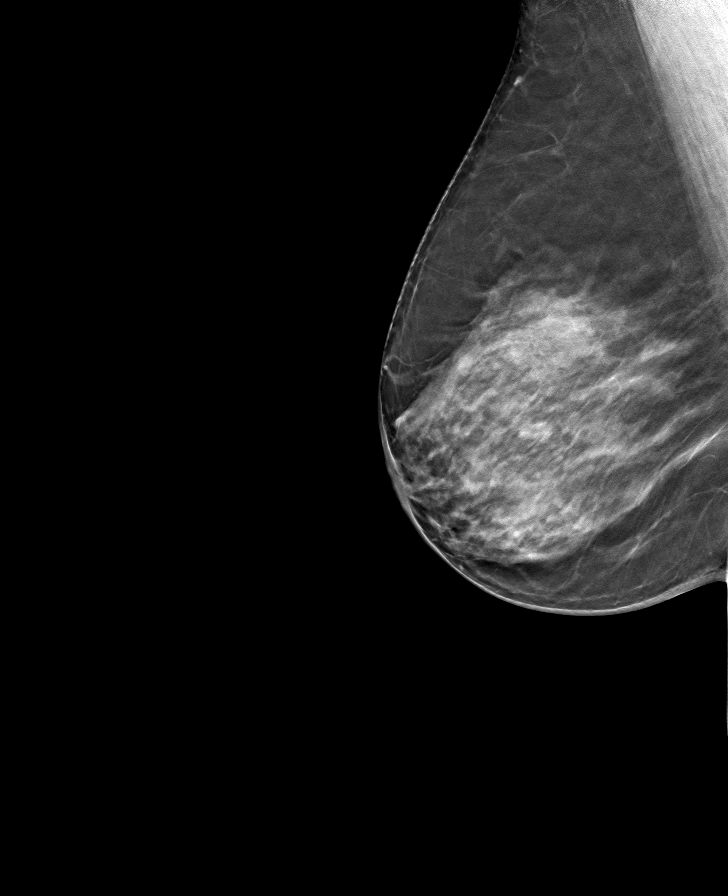

[8 of 24 positions shown; findings below may reference images not displayed]

ACR Breast Density Category c: The breast tissue is heterogeneously
dense, which may obscure small masses.
FINDINGS: No suspicious mammographic findings are identified in either breast.
The parenchymal pattern is stable.

Mammographic images were processed with CAD.

Targeted ultrasound is performed, showing normal fibroglandular
tissue without focal or suspicious sonographic abnormality.
Evaluation of the upper outer left breast was performed.
IMPRESSION: 1. No mammographic evidence of malignancy in either breast.
2. No focal sonographic findings at the site of the physician
palpated left breast lump.

RECOMMENDATION:
1. Clinical follow-up recommended for the palpable area of concern
in the left breast. Any further workup should be based on clinical
grounds.
2.  Screening mammogram in one year.(Code:DU-S-WDA)

I have discussed the findings and recommendations with the patient.
Results were also provided in writing at the conclusion of the
visit. If applicable, a reminder letter will be sent to the patient
regarding the next appointment.

BI-RADS CATEGORY  1: Negative.

## 2019-11-27 ENCOUNTER — Inpatient Hospital Stay: Payer: Medicare Other | Attending: Hematology & Oncology

## 2019-11-27 ENCOUNTER — Inpatient Hospital Stay (HOSPITAL_BASED_OUTPATIENT_CLINIC_OR_DEPARTMENT_OTHER): Payer: Medicare Other | Admitting: Family

## 2019-11-27 ENCOUNTER — Other Ambulatory Visit: Payer: Self-pay

## 2019-11-27 ENCOUNTER — Inpatient Hospital Stay: Payer: Medicare Other

## 2019-11-27 ENCOUNTER — Telehealth: Payer: Self-pay | Admitting: Family

## 2019-11-27 VITALS — BP 132/78 | HR 88 | Temp 98.8°F | Resp 18 | Wt 204.5 lb

## 2019-11-27 DIAGNOSIS — N182 Chronic kidney disease, stage 2 (mild): Secondary | ICD-10-CM

## 2019-11-27 DIAGNOSIS — D472 Monoclonal gammopathy: Secondary | ICD-10-CM | POA: Diagnosis not present

## 2019-11-27 DIAGNOSIS — D631 Anemia in chronic kidney disease: Secondary | ICD-10-CM

## 2019-11-27 DIAGNOSIS — D508 Other iron deficiency anemias: Secondary | ICD-10-CM | POA: Diagnosis not present

## 2019-11-27 LAB — CBC WITH DIFFERENTIAL (CANCER CENTER ONLY)
Abs Immature Granulocytes: 0.03 10*3/uL (ref 0.00–0.07)
Basophils Absolute: 0 10*3/uL (ref 0.0–0.1)
Basophils Relative: 1 %
Eosinophils Absolute: 0.1 10*3/uL (ref 0.0–0.5)
Eosinophils Relative: 3 %
HCT: 33.6 % — ABNORMAL LOW (ref 36.0–46.0)
Hemoglobin: 10.6 g/dL — ABNORMAL LOW (ref 12.0–15.0)
Immature Granulocytes: 1 %
Lymphocytes Relative: 56 %
Lymphs Abs: 2.2 10*3/uL (ref 0.7–4.0)
MCH: 31.9 pg (ref 26.0–34.0)
MCHC: 31.5 g/dL (ref 30.0–36.0)
MCV: 101.2 fL — ABNORMAL HIGH (ref 80.0–100.0)
Monocytes Absolute: 0.5 10*3/uL (ref 0.1–1.0)
Monocytes Relative: 12 %
Neutro Abs: 1 10*3/uL — ABNORMAL LOW (ref 1.7–7.7)
Neutrophils Relative %: 27 %
Platelet Count: 139 10*3/uL — ABNORMAL LOW (ref 150–400)
RBC: 3.32 MIL/uL — ABNORMAL LOW (ref 3.87–5.11)
RDW: 14 % (ref 11.5–15.5)
WBC Count: 3.9 10*3/uL — ABNORMAL LOW (ref 4.0–10.5)
nRBC: 0 % (ref 0.0–0.2)

## 2019-11-27 LAB — CMP (CANCER CENTER ONLY)
ALT: 15 U/L (ref 0–44)
AST: 17 U/L (ref 15–41)
Albumin: 4.5 g/dL (ref 3.5–5.0)
Alkaline Phosphatase: 65 U/L (ref 38–126)
Anion gap: 9 (ref 5–15)
BUN: 34 mg/dL — ABNORMAL HIGH (ref 8–23)
CO2: 27 mmol/L (ref 22–32)
Calcium: 9.7 mg/dL (ref 8.9–10.3)
Chloride: 110 mmol/L (ref 98–111)
Creatinine: 2.13 mg/dL — ABNORMAL HIGH (ref 0.44–1.00)
GFR, Est AFR Am: 26 mL/min — ABNORMAL LOW (ref >60)
GFR, Estimated: 22 mL/min — ABNORMAL LOW (ref >60)
Glucose, Bld: 124 mg/dL — ABNORMAL HIGH (ref 70–99)
Potassium: 4.6 mmol/L (ref 3.5–5.1)
Sodium: 146 mmol/L — ABNORMAL HIGH (ref 135–145)
Total Bilirubin: 0.3 mg/dL (ref 0.3–1.2)
Total Protein: 7.1 g/dL (ref 6.5–8.1)

## 2019-11-27 LAB — RETICULOCYTES
Immature Retic Fract: 8.8 % (ref 2.3–15.9)
RBC.: 3.29 MIL/uL — ABNORMAL LOW (ref 3.87–5.11)
Retic Count, Absolute: 27.6 10*3/uL (ref 19.0–186.0)
Retic Ct Pct: 0.8 % (ref 0.4–3.1)

## 2019-11-27 MED ORDER — DARBEPOETIN ALFA 300 MCG/0.6ML IJ SOSY
300.0000 ug | PREFILLED_SYRINGE | Freq: Once | INTRAMUSCULAR | Status: AC
  Administered 2019-11-27: 300 ug via SUBCUTANEOUS

## 2019-11-27 MED ORDER — DARBEPOETIN ALFA 300 MCG/0.6ML IJ SOSY
PREFILLED_SYRINGE | INTRAMUSCULAR | Status: AC
  Filled 2019-11-27: qty 0.6

## 2019-11-27 NOTE — Patient Instructions (Signed)
Darbepoetin Alfa injection (Aranesp) O que  este medicamento? A ALFADARBEPOETINA ajuda o seu organismo a produzir mais glbulos vermelhos do sangue.  usada para tratar a anemia provocada por doena renal crnica ou quimioterapia. Este medicamento pode ser usado para outros propsitos; em caso de dvidas, pergunte ao seu profissional de sade ou farmacutico. NOMES DE MARCAS COMUNS: Aranesp O que devo dizer a meu profissional de sade antes de tomar este medicamento? Precisam saber se voc tem algum dos seguintes problemas ou estados de sade:  distrbios da coagulao sangunea ou histria pregressa de cogulos (trombose)  tem cncer e no est fazendo quimioterapia  fibrose cstica  doenas cardacas, inclusive dor no peito (angina), insuficincia cardaca ou ataque do corao prvio  hemoglobina de 12 g/dL ou acima  presso alta  carncia de cido flico, ferro ou vitamina B12  convulses  reao estranha ou alergia  alfadarbepoetina,  eritropoetina,  albumina, s protenas de hamster ou ao ltex  reao estranha ou alergia a outros medicamentos, alimentos, corantes ou conservantes  est grvida ou tentando engravidar  est amamentando Como devo usar este medicamento? Este medicamento deve ser injetado por via subcutnea ou intravenosa. Este medicamento costuma ser administrado por um profissional da sade no hospital ou em consultrio. Se este medicamento for administrado em casa, voc ser ensinado a preparar e aplicar o medicamento. Use exatamente como indicado. Tome este medicamento em intervalos regulares. No tome este medicamento com frequncia maior do que a indicada.  muito importante que as agulhas e seringas usadas sejam descartadas em um coletor especial para materiais perfurocortantes. No as coloque na lata de lixo. Se no tiver um coletor para materiais perfurocortantes, pea um a seu farmacutico ou profissional de sade. O farmacutico lhe dar um folheto  informativo especial a cada compra do medicamento. No se esquea de ler atentamente essas informaes todas as vezes. Fale com seu pediatra a respeito do uso deste medicamento em crianas. Embora este medicamento possa ser administrado em crianas a partir de 1 ms de idade para certos quadros clnicos, algumas precaues so necessrias. Superdosagem: Se achar que tomou uma superdosagem deste medicamento, entre em contato imediatamente com o [facility] ou v a um pronto-socorro. OBSERVAO: Este medicamento  s para voc. No compartilhe este medicamento com outras pessoas. E se eu deixar de tomar uma dose? Se perder uma dose, tome-a assim que possvel. Se j estiver quase na hora da sua prxima dose, tome somente essa dose. No tome o remdio em dobro, nem tome uma dose adicional. O que pode interagir com este medicamento? No tome este medicamento com nenhum dos seguintes:  alfaepoetina Esta lista pode no descrever todas as interaes possveis. D ao seu profissional de sade uma lista de todos os medicamentos, ervas medicinais, remdios de venda livre, ou suplementos alimentares que voc usa. Diga tambm se voc fuma, bebe, ou usa drogas ilcitas. Alguns destes podem interagir com o seu medicamento. Ao que devo ficar atento quando estiver usando este medicamento? Voc ser monitorado(a) atentamente enquanto estiver tomando este medicamento. Voc precisar fazer exames de sangue peridicos enquanto estiver tomando este medicamento. Este medicamento pode causar uma diminuio nos nveis de vitamina B6. Voc deve consumir vitamina B6 em quantidade suficiente enquanto estiver tomando este medicamento. Converse com seu mdico ou profissional de sade a respeito dos alimentos que consome e das vitaminas que toma. Que efeitos colaterais posso sentir aps usar este medicamento? Efeitos colaterais que devem ser informados ao seu mdico ou profissional de sade o mais rpido    possvel:  reaes alrgicas, como erupo na pele, coceira, urticria, ou inchao do rosto, dos lbios ou da lngua  dificuldade para respirar  alteraes na viso  dor no peito  confuso, dificuldade para falar ou entender os outros  sensao de tontura, desmaio, quedas  presso alta  dores musculares  dor, inchao, calor na perna  ganho rpido de peso  dor de cabea forte  dormncia ou fraqueza sbita do rosto, brao ou perna  dificuldade para andar, tontura, perda de equilbrio ou coordenao  convulses (crises convulsivas)  inchao dos tornozelos, ps ou mos  fraqueza ou cansao fora do comum Efeitos colaterais que normalmente no precisam de cuidados mdicos (avise ao seu mdico ou profissional de sade se persistirem ou forem incmodos):  diarreia  febre, calafrios (sintomas gripais)  dor de cabea  enjoo ou vmitos  vermelhido, ardncia ou inchao no local da injeo Esta lista pode no descrever todos os efeitos colaterais possveis. Para mais orientaes sobre efeitos colaterais, consulte o seu mdico. Voc pode relatar a ocorrncia de efeitos colaterais  FDA pelo telefone [phone]. Onde devo guardar meu medicamento? Manter fora do alcance das crianas. Conservar sob refrigerao, entre 2 e 8 degreesC (36 e 46 degreesF). No congelar. No agite. Descartar qualquer poro no utilizada se estiver usando um frasco-ampola de dose nica. Descartar qualquer medicamento no utilizado aps a data de validade impressa no rtulo ou embalagem. OBSERVAO: Este folheto  um resumo. Pode no cobrir todas as informaes possveis. Se tiver dvidas a respeito deste medicamento, fale com seu mdico, farmacutico ou profissional de sade.  2020 Elsevier/Gold Standard (2017-07-26 00:00:00)  

## 2019-11-27 NOTE — Progress Notes (Signed)
Hematology and Oncology Follow Up Visit  Tina Molina [id number] Nov 26, 1945 74 y.o. 11/27/2019   Principle Diagnosis:  Anemia of chronic renal failure - stage II MGUS - IgA lambda Iron deficiency anemia  Current Therapy: Aranesp 300 mcg SQ to keep Hgb > 11 IV iron as indicated   Interim History:  [name] is here today for follow-up. She is doing well but has some occasional fatigue.  She has tenderness in her left knee which she states is due to arthritis.  No fever, chills, n/v, cough, rash, dizziness, SOB, chest pain, palpitations, abdominal pain or changes in bowel or bladder habits.  She notes occasional swelling in her hands and lower extremities. She takes lasix daily which she feels does reduce the fluid retention.  The numbness in her toes is unchanged. No falls or syncopal episodes to report.  She states that she has a good appetite and is staying well hydrated. Her weight is stable.   ECOG Performance Status: 1 - Symptomatic but completely ambulatory  Medications:  Allergies as of 11/27/2019      Reactions   Penicillins Other (See Comments)   Intolerance or allergy not remembered; her sister stated penicillin caused "boils under the skin"   Shellfish Allergy Rash   Shellfish-derived Products Anaphylaxis   Codeine Nausea Only   Nausea only with liquid codeine   Infliximab Other (See Comments)    Confusion (intolerance)   Sulfonamide Derivatives Rash   Azithromycin Itching      Medication List       Accurate as of November 27, 2019  1:53 PM. If you have any questions, ask your nurse or doctor.        aspirin 81 MG tablet Take 81 mg by mouth daily.   atorvastatin 20 MG tablet Commonly known as: LIPITOR TAKE 1 TABLET(20 MG) BY MOUTH DAILY   carvedilol 25 MG tablet Commonly known as: COREG TAKE 1 TABLET(25 MG) BY MOUTH TWICE DAILY   cyclobenzaprine 10 MG tablet Commonly known as: FLEXERIL Take 10 mg by mouth 3 (three) times daily as needed for  muscle spasms.   famotidine 40 MG tablet Commonly known as: PEPCID TAKE 1 TABLET(40 MG) BY MOUTH TWICE DAILY   furosemide 20 MG tablet Commonly known as: LASIX Take 20 mg by mouth daily.   gabapentin 300 MG capsule Commonly known as: NEURONTIN Take 1 capsule (300 mg total) by mouth daily.   hydrALAZINE 50 MG tablet Commonly known as: APRESOLINE TAKE 1 TABLET(50 MG) BY MOUTH THREE TIMES DAILY   leflunomide 20 MG tablet Commonly known as: ARAVA Take 20 mg by mouth daily.   levothyroxine 175 MCG tablet Commonly known as: SYNTHROID Take one pill daily before breakfast 6 days a week and 1.5 pills one day a week before breakfast   Lumigan 0.01 % Soln Generic drug: bimatoprost INSTILL 1 DROP INTO BOTH EYES ONCE DAILY AT BEDTIME   bimatoprost 0.03 % ophthalmic solution Commonly known as: LUMIGAN 1 drop at bedtime.   MELATONIN GUMMIES PO Take by mouth at bedtime as needed.   multivitamin with minerals Tabs tablet Take 1 tablet by mouth daily.   spironolactone 50 MG tablet Commonly known as: ALDACTONE TAKE 1/2 TABLET BY MOUTH ONCE DAILY   Symbicort 160-4.5 MCG/ACT inhaler Generic drug: budesonide-formoterol inhale 2 puffs if needed for wheezing   telmisartan 40 MG tablet Commonly known as: MICARDIS Take 40 mg by mouth daily. Taking half a tablet   vitamin E 180 MG (400 UNITS) capsule Take  400 Units by mouth daily.       Allergies:  Allergies  Allergen Reactions  . Penicillins Other (See Comments)    Intolerance or allergy not remembered; her sister stated penicillin caused "boils under the skin"  . Shellfish Allergy Rash  . Shellfish-Derived Products Anaphylaxis  . Codeine Nausea Only    Nausea only with liquid codeine  . Infliximab Other (See Comments)     Confusion (intolerance)  . Sulfonamide Derivatives Rash  . Azithromycin Itching    Past Medical History, Surgical history, Social history, and Family History were reviewed and updated.  Review of  Systems: All other 10 point review of systems is negative.   Physical Exam:  weight is 204 lb 8 oz (92.8 kg). Her oral temperature is 98.8 F (37.1 C). Her blood pressure is 132/78 and her pulse is 88. Her respiration is 18 and oxygen saturation is 99%.   Wt Readings from Last 3 Encounters:  11/27/19 204 lb 8 oz (92.8 kg)  10/26/19 205 lb (93 kg)  10/16/19 206 lb (93.4 kg)    Ocular: Sclerae unicteric, pupils equal, round and reactive to light Ear-nose-throat: Oropharynx clear, dentition fair Lymphatic: No cervical or supraclavicular adenopathy Lungs no rales or rhonchi, good excursion bilaterally Heart regular rate and rhythm, no murmur appreciated Abd soft, nontender, positive bowel sounds, no liver or spleen tip palpated on exam, no fluid wave  MSK no focal spinal tenderness, no joint edema Neuro: non-focal, well-oriented, appropriate affect Breasts: Deferred   Lab Results  Component Value Date   WBC 3.9 (L) 11/27/2019   HGB 10.6 (L) 11/27/2019   HCT 33.6 (L) 11/27/2019   MCV 101.2 (H) 11/27/2019   PLT 139 (L) 11/27/2019   Lab Results  Component Value Date   FERRITIN 144 10/16/2019   IRON 113 10/16/2019   TIBC 334 10/16/2019   UIBC 221 10/16/2019   IRONPCTSAT 34 (H) 10/16/2019   Lab Results  Component Value Date   RETICCTPCT 0.8 11/27/2019   RBC 3.32 (L) 11/27/2019   RBC 3.29 (L) 11/27/2019   RETICCTABS 51.6 01/03/2013   Lab Results  Component Value Date   KPAFRELGTCHN 28.6 (H) 07/20/2019   LAMBDASER 43.7 (H) 07/20/2019   KAPLAMBRATIO 0.65 07/20/2019   Lab Results  Component Value Date   IGGSERUM 1,091 07/20/2019   IGA 516 (H) 07/20/2019   IGMSERUM 31 07/20/2019   Lab Results  Component Value Date   TOTALPROTELP 6.7 07/20/2019   ALBUMINELP 3.8 07/20/2019   A1GS 0.2 07/20/2019   A2GS 0.6 07/20/2019   BETS 1.2 07/20/2019   BETA2SER 10.3 (H) 11/04/2011   GAMS 0.9 07/20/2019   MSPIKE Not Observed 07/20/2019   SPEI Comment 07/20/2019     Chemistry        Component Value Date/Time   NA 146 (H) 11/27/2019 1311   NA 150 (H) 03/01/2017 1308   NA 143 04/05/2016 1156   K 4.6 11/27/2019 1311   K 4.0 03/01/2017 1308   K 3.8 04/05/2016 1156   CL 110 11/27/2019 1311   CL 109 (H) 03/01/2017 1308   CO2 27 11/27/2019 1311   CO2 28 03/01/2017 1308   CO2 21 (L) 04/05/2016 1156   BUN 34 (H) 11/27/2019 1311   BUN 19 03/01/2017 1308   BUN 18.5 04/05/2016 1156   CREATININE 2.13 (H) 11/27/2019 1311   CREATININE 1.8 (H) 03/01/2017 1308   CREATININE 1.5 (H) 04/05/2016 1156      Component Value Date/Time   CALCIUM 9.7 11/27/2019  1311   CALCIUM 9.7 03/01/2017 1308   CALCIUM 9.3 04/05/2016 1156   ALKPHOS 65 11/27/2019 1311   ALKPHOS 55 03/01/2017 1308   ALKPHOS 77 04/05/2016 1156   AST 17 11/27/2019 1311   AST 21 04/05/2016 1156   ALT 15 11/27/2019 1311   ALT 18 03/01/2017 1308   ALT 22 04/05/2016 1156   BILITOT 0.3 11/27/2019 1311   BILITOT 0.36 04/05/2016 1156       Impression and Plan: Ms. Deliz is a very pleasant 42yoAfrican American female with multifactorial anemia as well as an IgA lambda MGUS. She received Aranesp today for Hgb 10.6.  We will see what her iron studies look like and replace next week if needed.  We willsee her againin another 6 weeks.  She is in agreement and will contact our office with any questions or concerns.We can certainly see her sooner if needed.  Laverna Peace, NP 7/13/20211:53 PM

## 2019-11-27 NOTE — Telephone Encounter (Signed)
Appointments scheduled calendar printed per 7/13 los

## 2019-11-28 LAB — IRON AND TIBC
Iron: 107 ug/dL (ref 41–142)
Saturation Ratios: 38 % (ref 21–57)
TIBC: 279 ug/dL (ref 236–444)
UIBC: 171 ug/dL (ref 120–384)

## 2019-11-28 LAB — FERRITIN: Ferritin: 160 ng/mL (ref 11–307)

## 2019-12-03 DIAGNOSIS — H40053 Ocular hypertension, bilateral: Secondary | ICD-10-CM | POA: Diagnosis not present

## 2019-12-23 ENCOUNTER — Other Ambulatory Visit: Payer: Self-pay | Admitting: Internal Medicine

## 2019-12-23 DIAGNOSIS — I10 Essential (primary) hypertension: Secondary | ICD-10-CM

## 2019-12-26 ENCOUNTER — Other Ambulatory Visit: Payer: Self-pay | Admitting: Internal Medicine

## 2019-12-27 DIAGNOSIS — Z79899 Other long term (current) drug therapy: Secondary | ICD-10-CM | POA: Diagnosis not present

## 2019-12-27 DIAGNOSIS — M0589 Other rheumatoid arthritis with rheumatoid factor of multiple sites: Secondary | ICD-10-CM | POA: Diagnosis not present

## 2020-01-07 DIAGNOSIS — M069 Rheumatoid arthritis, unspecified: Secondary | ICD-10-CM | POA: Diagnosis not present

## 2020-01-07 DIAGNOSIS — D631 Anemia in chronic kidney disease: Secondary | ICD-10-CM | POA: Diagnosis not present

## 2020-01-07 DIAGNOSIS — I129 Hypertensive chronic kidney disease with stage 1 through stage 4 chronic kidney disease, or unspecified chronic kidney disease: Secondary | ICD-10-CM | POA: Diagnosis not present

## 2020-01-07 DIAGNOSIS — N1832 Chronic kidney disease, stage 3b: Secondary | ICD-10-CM | POA: Diagnosis not present

## 2020-01-07 DIAGNOSIS — D472 Monoclonal gammopathy: Secondary | ICD-10-CM | POA: Diagnosis not present

## 2020-01-08 ENCOUNTER — Inpatient Hospital Stay: Payer: Medicare Other

## 2020-01-08 ENCOUNTER — Inpatient Hospital Stay: Payer: Medicare Other | Admitting: Family

## 2020-01-11 ENCOUNTER — Encounter: Payer: Self-pay | Admitting: Family

## 2020-01-11 ENCOUNTER — Inpatient Hospital Stay: Payer: Medicare Other

## 2020-01-11 ENCOUNTER — Inpatient Hospital Stay: Payer: Medicare Other | Attending: Hematology & Oncology

## 2020-01-11 ENCOUNTER — Inpatient Hospital Stay (HOSPITAL_BASED_OUTPATIENT_CLINIC_OR_DEPARTMENT_OTHER): Payer: Medicare Other | Admitting: Family

## 2020-01-11 ENCOUNTER — Other Ambulatory Visit: Payer: Self-pay

## 2020-01-11 VITALS — BP 150/75 | HR 84 | Temp 97.9°F | Resp 18 | Ht 64.0 in | Wt 203.4 lb

## 2020-01-11 DIAGNOSIS — D631 Anemia in chronic kidney disease: Secondary | ICD-10-CM

## 2020-01-11 DIAGNOSIS — Z79899 Other long term (current) drug therapy: Secondary | ICD-10-CM | POA: Diagnosis not present

## 2020-01-11 DIAGNOSIS — Z882 Allergy status to sulfonamides status: Secondary | ICD-10-CM | POA: Insufficient documentation

## 2020-01-11 DIAGNOSIS — D508 Other iron deficiency anemias: Secondary | ICD-10-CM

## 2020-01-11 DIAGNOSIS — R200 Anesthesia of skin: Secondary | ICD-10-CM | POA: Insufficient documentation

## 2020-01-11 DIAGNOSIS — R5383 Other fatigue: Secondary | ICD-10-CM | POA: Diagnosis not present

## 2020-01-11 DIAGNOSIS — R0602 Shortness of breath: Secondary | ICD-10-CM | POA: Diagnosis not present

## 2020-01-11 DIAGNOSIS — Z880 Allergy status to penicillin: Secondary | ICD-10-CM | POA: Insufficient documentation

## 2020-01-11 DIAGNOSIS — N182 Chronic kidney disease, stage 2 (mild): Secondary | ICD-10-CM | POA: Diagnosis not present

## 2020-01-11 DIAGNOSIS — E611 Iron deficiency: Secondary | ICD-10-CM | POA: Diagnosis not present

## 2020-01-11 DIAGNOSIS — Z881 Allergy status to other antibiotic agents status: Secondary | ICD-10-CM | POA: Insufficient documentation

## 2020-01-11 DIAGNOSIS — Z885 Allergy status to narcotic agent status: Secondary | ICD-10-CM | POA: Insufficient documentation

## 2020-01-11 DIAGNOSIS — D472 Monoclonal gammopathy: Secondary | ICD-10-CM | POA: Diagnosis not present

## 2020-01-11 LAB — CMP (CANCER CENTER ONLY)
ALT: 15 U/L (ref 0–44)
AST: 16 U/L (ref 15–41)
Albumin: 4.3 g/dL (ref 3.5–5.0)
Alkaline Phosphatase: 62 U/L (ref 38–126)
Anion gap: 8 (ref 5–15)
BUN: 28 mg/dL — ABNORMAL HIGH (ref 8–23)
CO2: 26 mmol/L (ref 22–32)
Calcium: 9.5 mg/dL (ref 8.9–10.3)
Chloride: 108 mmol/L (ref 98–111)
Creatinine: 1.93 mg/dL — ABNORMAL HIGH (ref 0.44–1.00)
GFR, Est AFR Am: 29 mL/min — ABNORMAL LOW (ref >60)
GFR, Estimated: 25 mL/min — ABNORMAL LOW (ref >60)
Glucose, Bld: 107 mg/dL — ABNORMAL HIGH (ref 70–99)
Potassium: 4.1 mmol/L (ref 3.5–5.1)
Sodium: 142 mmol/L (ref 135–145)
Total Bilirubin: 0.4 mg/dL (ref 0.3–1.2)
Total Protein: 7.4 g/dL (ref 6.5–8.1)

## 2020-01-11 LAB — CBC WITH DIFFERENTIAL (CANCER CENTER ONLY)
Abs Immature Granulocytes: 0.02 10*3/uL (ref 0.00–0.07)
Basophils Absolute: 0 10*3/uL (ref 0.0–0.1)
Basophils Relative: 1 %
Eosinophils Absolute: 0.1 10*3/uL (ref 0.0–0.5)
Eosinophils Relative: 3 %
HCT: 32.1 % — ABNORMAL LOW (ref 36.0–46.0)
Hemoglobin: 10.4 g/dL — ABNORMAL LOW (ref 12.0–15.0)
Immature Granulocytes: 1 %
Lymphocytes Relative: 44 %
Lymphs Abs: 1.8 10*3/uL (ref 0.7–4.0)
MCH: 31.6 pg (ref 26.0–34.0)
MCHC: 32.4 g/dL (ref 30.0–36.0)
MCV: 97.6 fL (ref 80.0–100.0)
Monocytes Absolute: 0.5 10*3/uL (ref 0.1–1.0)
Monocytes Relative: 11 %
Neutro Abs: 1.6 10*3/uL — ABNORMAL LOW (ref 1.7–7.7)
Neutrophils Relative %: 40 %
Platelet Count: 149 10*3/uL — ABNORMAL LOW (ref 150–400)
RBC: 3.29 MIL/uL — ABNORMAL LOW (ref 3.87–5.11)
RDW: 13.5 % (ref 11.5–15.5)
WBC Count: 4 10*3/uL (ref 4.0–10.5)
nRBC: 0 % (ref 0.0–0.2)

## 2020-01-11 LAB — RETICULOCYTES
Immature Retic Fract: 12.4 % (ref 2.3–15.9)
RBC.: 3.24 MIL/uL — ABNORMAL LOW (ref 3.87–5.11)
Retic Count, Absolute: 31.4 10*3/uL (ref 19.0–186.0)
Retic Ct Pct: 1 % (ref 0.4–3.1)

## 2020-01-11 MED ORDER — DARBEPOETIN ALFA 300 MCG/0.6ML IJ SOSY
300.0000 ug | PREFILLED_SYRINGE | Freq: Once | INTRAMUSCULAR | Status: AC
  Administered 2020-01-11: 300 ug via SUBCUTANEOUS

## 2020-01-11 NOTE — Patient Instructions (Signed)
Darbepoetin Alfa injection What is this medicine? DARBEPOETIN ALFA (dar be POE e tin AL fa) helps your body make more red blood cells. It is used to treat anemia caused by chronic kidney failure and chemotherapy. This medicine may be used for other purposes; ask your health care provider or pharmacist if you have questions. COMMON BRAND NAME(S): Aranesp What should I tell my health care provider before I take this medicine? They need to know if you have any of these conditions:  blood clotting disorders or history of blood clots  cancer patient not on chemotherapy  cystic fibrosis  heart disease, such as angina, heart failure, or a history of a heart attack  hemoglobin level of 12 g/dL or greater  high blood pressure  low levels of folate, iron, or vitamin B12  seizures  an unusual or allergic reaction to darbepoetin, erythropoietin, albumin, hamster proteins, latex, other medicines, foods, dyes, or preservatives  pregnant or trying to get pregnant  breast-feeding How should I use this medicine? This medicine is for injection into a vein or under the skin. It is usually given by a health care professional in a hospital or clinic setting. If you get this medicine at home, you will be taught how to prepare and give this medicine. Use exactly as directed. Take your medicine at regular intervals. Do not take your medicine more often than directed. It is important that you put your used needles and syringes in a special sharps container. Do not put them in a trash can. If you do not have a sharps container, call your pharmacist or healthcare provider to get one. A special MedGuide will be given to you by the pharmacist with each prescription and refill. Be sure to read this information carefully each time. Talk to your pediatrician regarding the use of this medicine in children. While this medicine may be used in children as young as 1 month of age for selected conditions, precautions do  apply. Overdosage: If you think you have taken too much of this medicine contact a poison control center or emergency room at once. NOTE: This medicine is only for you. Do not share this medicine with others. What if I miss a dose? If you miss a dose, take it as soon as you can. If it is almost time for your next dose, take only that dose. Do not take double or extra doses. What may interact with this medicine? Do not take this medicine with any of the following medications:  epoetin alfa This list may not describe all possible interactions. Give your health care provider a list of all the medicines, herbs, non-prescription drugs, or dietary supplements you use. Also tell them if you smoke, drink alcohol, or use illegal drugs. Some items may interact with your medicine. What should I watch for while using this medicine? Your condition will be monitored carefully while you are receiving this medicine. You may need blood work done while you are taking this medicine. This medicine may cause a decrease in vitamin B6. You should make sure that you get enough vitamin B6 while you are taking this medicine. Discuss the foods you eat and the vitamins you take with your health care professional. What side effects may I notice from receiving this medicine? Side effects that you should report to your doctor or health care professional as soon as possible:  allergic reactions like skin rash, itching or hives, swelling of the face, lips, or tongue  breathing problems  changes in   vision  chest pain  confusion, trouble speaking or understanding  feeling faint or lightheaded, falls  high blood pressure  muscle aches or pains  pain, swelling, warmth in the leg  rapid weight gain  severe headaches  sudden numbness or weakness of the face, arm or leg  trouble walking, dizziness, loss of balance or coordination  seizures (convulsions)  swelling of the ankles, feet, hands  unusually weak or  tired Side effects that usually do not require medical attention (report to your doctor or health care professional if they continue or are bothersome):  diarrhea  fever, chills (flu-like symptoms)  headaches  nausea, vomiting  redness, stinging, or swelling at site where injected This list may not describe all possible side effects. Call your doctor for medical advice about side effects. You may report side effects to FDA at 1-800-FDA-1088. Where should I keep my medicine? Keep out of the reach of children. Store in a refrigerator between 2 and 8 degrees C (36 and 46 degrees F). Do not freeze. Do not shake. Throw away any unused portion if using a single-dose vial. Throw away any unused medicine after the expiration date. NOTE: This sheet is a summary. It may not cover all possible information. If you have questions about this medicine, talk to your doctor, pharmacist, or health care provider.  2020 Elsevier/Gold Standard (2017-05-18 16:44:20)  

## 2020-01-11 NOTE — Progress Notes (Signed)
Hematology and Oncology Follow Up Visit  Tina Molina [id number] 09/02/1945 74 y.o. 01/11/2020   Principle Diagnosis:  Anemia of chronic renal failure - stage II MGUS - IgA lambda Iron deficiency anemia  Current Therapy: Aranesp 300 mcg SQ to keep Hgb > 11 IV iron as indicated   Interim History:  Tina Molina is here today for follow-up. She is symptomatic with fatigue and SOB with exertion.  Hgb is 10.4, MCV 97, platelets 149 and WBC count 4.0.  SPEP is pending. IgA level 583 and lambda light chains are pending.  She has not noted any blood loss. No bruising or petechiae. No fever, chills, n/v, cough, rash, dizziness, chest pain, palpitations, abdominal pain or changes in bowel or bladder habits.   No swelling or tenderness in her extremities.  She has numbness in her toes that comes and goes.  No falls or syncope.  She has maintained a good appetite and is staying well hydrated. Her weight is stable.   ECOG Performance Status: 1 - Symptomatic but completely ambulatory  Medications:  Allergies as of 01/11/2020      Reactions   Penicillins Other (See Comments)   Intolerance or allergy not remembered; her sister stated penicillin caused "boils under the skin"   Shellfish Allergy Rash   Shellfish-derived Products Anaphylaxis   Codeine Nausea Only   Nausea only with liquid codeine   Infliximab Other (See Comments)    Confusion (intolerance)   Sulfonamide Derivatives Rash   Azithromycin Itching      Medication List       Accurate as of January 11, 2020  2:29 PM. If you have any questions, ask your nurse or doctor.        aspirin 81 MG tablet Take 81 mg by mouth daily.   atorvastatin 20 MG tablet Commonly known as: LIPITOR TAKE 1 TABLET(20 MG) BY MOUTH DAILY   carvedilol 25 MG tablet Commonly known as: COREG TAKE 1 TABLET(25 MG) BY MOUTH TWICE DAILY   cyclobenzaprine 10 MG tablet Commonly known as: FLEXERIL Take 10 mg by mouth 3 (three) times daily as  needed for muscle spasms.   famotidine 40 MG tablet Commonly known as: PEPCID TAKE 1 TABLET(40 MG) BY MOUTH TWICE DAILY   furosemide 20 MG tablet Commonly known as: LASIX Take 20 mg by mouth daily.   gabapentin 300 MG capsule Commonly known as: NEURONTIN TAKE 1 CAPSULE(300 MG) BY MOUTH DAILY   hydrALAZINE 50 MG tablet Commonly known as: APRESOLINE TAKE 1 TABLET BY MOUTH THREE TIMES DAILY   leflunomide 20 MG tablet Commonly known as: ARAVA Take 20 mg by mouth daily.   levothyroxine 175 MCG tablet Commonly known as: SYNTHROID Take one pill daily before breakfast 6 days a week and 1.5 pills one day a week before breakfast   Lumigan 0.01 % Soln Generic drug: bimatoprost INSTILL 1 DROP INTO BOTH EYES ONCE DAILY AT BEDTIME   bimatoprost 0.03 % ophthalmic solution Commonly known as: LUMIGAN 1 drop at bedtime.   MELATONIN GUMMIES PO Take by mouth at bedtime as needed.   multivitamin with minerals Tabs tablet Take 1 tablet by mouth daily.   spironolactone 50 MG tablet Commonly known as: ALDACTONE TAKE 1/2 TABLET BY MOUTH ONCE DAILY   Symbicort 160-4.5 MCG/ACT inhaler Generic drug: budesonide-formoterol inhale 2 puffs if needed for wheezing   telmisartan 40 MG tablet Commonly known as: MICARDIS Take 40 mg by mouth daily. Taking half a tablet   vitamin E 180 MG (400 UNITS)  capsule Take 400 Units by mouth daily.       Allergies:  Allergies  Allergen Reactions   Penicillins Other (See Comments)    Intolerance or allergy not remembered; her sister stated penicillin caused "boils under the skin"   Shellfish Allergy Rash   Shellfish-Derived Products Anaphylaxis   Codeine Nausea Only    Nausea only with liquid codeine   Infliximab Other (See Comments)     Confusion (intolerance)   Sulfonamide Derivatives Rash   Azithromycin Itching    Past Medical History, Surgical history, Social history, and Family History were reviewed and updated.  Review of  Systems: All other 10 point review of systems is negative.   Physical Exam:  height is 5\' 4"  (1.626 m) and weight is 203 lb 6.4 oz (92.3 kg). Her oral temperature is 97.9 F (36.6 C). Her blood pressure is 150/75 (abnormal) and her pulse is 84. Her respiration is 18 and oxygen saturation is 100%.   Wt Readings from Last 3 Encounters:  01/11/20 203 lb 6.4 oz (92.3 kg)  11/27/19 204 lb 8 oz (92.8 kg)  10/26/19 205 lb (93 kg)    Ocular: Sclerae unicteric, pupils equal, round and reactive to light Ear-nose-throat: Oropharynx clear, dentition fair Lymphatic: No cervical or supraclavicular adenopathy Lungs no rales or rhonchi, good excursion bilaterally Heart regular rate and rhythm, no murmur appreciated Abd soft, nontender, positive bowel sounds, no liver or spleen tip palpated on exam, no fluid wave  MSK no focal spinal tenderness, no joint edema Neuro: non-focal, well-oriented, appropriate affect Breasts: Deferred   Lab Results  Component Value Date   WBC 4.0 01/11/2020   HGB 10.4 (L) 01/11/2020   HCT 32.1 (L) 01/11/2020   MCV 97.6 01/11/2020   PLT 149 (L) 01/11/2020   Lab Results  Component Value Date   FERRITIN 160 11/27/2019   IRON 107 11/27/2019   TIBC 279 11/27/2019   UIBC 171 11/27/2019   IRONPCTSAT 38 11/27/2019   Lab Results  Component Value Date   RETICCTPCT 1.0 01/11/2020   RBC 3.24 (L) 01/11/2020   RETICCTABS 51.6 01/03/2013   Lab Results  Component Value Date   KPAFRELGTCHN 28.6 (H) 07/20/2019   LAMBDASER 43.7 (H) 07/20/2019   KAPLAMBRATIO 0.65 07/20/2019   Lab Results  Component Value Date   IGGSERUM 1,091 07/20/2019   IGA 516 (H) 07/20/2019   IGMSERUM 31 07/20/2019   Lab Results  Component Value Date   TOTALPROTELP 6.7 07/20/2019   ALBUMINELP 3.8 07/20/2019   A1GS 0.2 07/20/2019   A2GS 0.6 07/20/2019   BETS 1.2 07/20/2019   BETA2SER 10.3 (H) 11/04/2011   GAMS 0.9 07/20/2019   MSPIKE Not Observed 07/20/2019   SPEI Comment 07/20/2019      Chemistry      Component Value Date/Time   NA 146 (H) 11/27/2019 1311   NA 150 (H) 03/01/2017 1308   NA 143 04/05/2016 1156   K 4.6 11/27/2019 1311   K 4.0 03/01/2017 1308   K 3.8 04/05/2016 1156   CL 110 11/27/2019 1311   CL 109 (H) 03/01/2017 1308   CO2 27 11/27/2019 1311   CO2 28 03/01/2017 1308   CO2 21 (L) 04/05/2016 1156   BUN 34 (H) 11/27/2019 1311   BUN 19 03/01/2017 1308   BUN 18.5 04/05/2016 1156   CREATININE 2.13 (H) 11/27/2019 1311   CREATININE 1.8 (H) 03/01/2017 1308   CREATININE 1.5 (H) 04/05/2016 1156      Component Value Date/Time   CALCIUM  9.7 11/27/2019 1311   CALCIUM 9.7 03/01/2017 1308   CALCIUM 9.3 04/05/2016 1156   ALKPHOS 65 11/27/2019 1311   ALKPHOS 55 03/01/2017 1308   ALKPHOS 77 04/05/2016 1156   AST 17 11/27/2019 1311   AST 21 04/05/2016 1156   ALT 15 11/27/2019 1311   ALT 18 03/01/2017 1308   ALT 22 04/05/2016 1156   BILITOT 0.3 11/27/2019 1311   BILITOT 0.36 04/05/2016 1156       Impression and Plan: Tina Molina is a very pleasant 49yoAfrican American female with multifactorial anemia as well as an IgA lambda MGUS. She received Aranesp for Hgb 10.4.  We will see what her iron studies look like and replace if needed.  We will see her again in another 6 weeks.  She can contact our office with any questions or concerns.   Laverna Peace, NP 8/27/20212:29 PM

## 2020-01-12 LAB — IGG, IGA, IGM
IgA: 583 mg/dL — ABNORMAL HIGH (ref 64–422)
IgG (Immunoglobin G), Serum: 962 mg/dL (ref 586–1602)
IgM (Immunoglobulin M), Srm: 25 mg/dL — ABNORMAL LOW (ref 26–217)

## 2020-01-14 LAB — IRON AND TIBC
Iron: 113 ug/dL (ref 41–142)
Saturation Ratios: 44 % (ref 21–57)
TIBC: 260 ug/dL (ref 236–444)
UIBC: 147 ug/dL (ref 120–384)

## 2020-01-14 LAB — PROTEIN ELECTROPHORESIS, SERUM
A/G Ratio: 1.3 (ref 0.7–1.7)
Albumin ELP: 3.9 g/dL (ref 2.9–4.4)
Alpha-1-Globulin: 0.2 g/dL (ref 0.0–0.4)
Alpha-2-Globulin: 0.7 g/dL (ref 0.4–1.0)
Beta Globulin: 1.3 g/dL (ref 0.7–1.3)
Gamma Globulin: 0.9 g/dL (ref 0.4–1.8)
Globulin, Total: 3.1 g/dL (ref 2.2–3.9)
M-Spike, %: 0.3 g/dL — ABNORMAL HIGH
Total Protein ELP: 7 g/dL (ref 6.0–8.5)

## 2020-01-14 LAB — KAPPA/LAMBDA LIGHT CHAINS
Kappa free light chain: 25.9 mg/L — ABNORMAL HIGH (ref 3.3–19.4)
Kappa, lambda light chain ratio: 0.69 (ref 0.26–1.65)
Lambda free light chains: 37.5 mg/L — ABNORMAL HIGH (ref 5.7–26.3)

## 2020-01-14 LAB — FERRITIN: Ferritin: 233 ng/mL (ref 11–307)

## 2020-02-14 NOTE — Patient Instructions (Addendum)
  Blood work was ordered.    Flu immunization administered today.      Medications reviewed and updated.  Changes include :   none     Please followup in 6 months   

## 2020-02-14 NOTE — Progress Notes (Signed)
Subjective:    Patient ID: Tina Molina, female    DOB: 1945-11-12, 74 y.o.   MRN: [id number]  HPI The patient is here for follow up of their chronic medical problems, including htn, hypothyroidism, hyperlipidemia, hyperglycemia,  GERD, CKD  She is taking all of her medications as prescribed.   She is not exercising regularly.     She does feel tired.    Medications and allergies reviewed with patient and updated if appropriate.  Patient Active Problem List   Diagnosis Date Noted  . Nocturia more than twice per night 10/25/2017  . Insomnia secondary to chronic pain 10/25/2017  . Upper back pain on left side 04/20/2017  . IDA (iron deficiency anemia) 04/06/2016  . Erythropoietin deficiency anemia 02/23/2016  . GERD (gastroesophageal reflux disease) 06/25/2015  . Hyperlipidemia 01/13/2015  . Hyperglycemia 01/13/2015  . Rheumatoid factor positive 02/20/2014  . Thoracic radiculopathy 10/30/2013  . Circadian rhythm sleep disorder, shift work type 11/03/2012  . Hematuria 08/05/2012  . MGUS (monoclonal gammopathy of unknown significance) 08/03/2012  . Lumbar stenosis with neurogenic claudication 02/29/2012  . Edema 09/13/2011  . Chronic kidney disease, stage IV (severe) ([place]) 12/19/2009  . HERNIATED LUMBAR DISC 12/19/2009  . PULMONARY SARCOIDOSIS 10/16/2009  . Hypothyroidism 10/16/2009  . Anemia associated with stage 2 chronic renal failure 10/16/2009  . CARPAL TUNNEL SYNDROME, BILATERAL 10/16/2009  . UVEITIS 10/16/2009  . Essential hypertension 10/16/2009  . ARTHRITIS 10/16/2009  . CERVICAL DISC DISORDER 10/16/2009    Current Outpatient Medications on File Prior to Visit  Medication Sig Dispense Refill  . aspirin 81 MG tablet Take 81 mg by mouth daily.     Marland Kitchen atorvastatin (LIPITOR) 20 MG tablet TAKE 1 TABLET(20 MG) BY MOUTH DAILY 90 tablet 1  . bimatoprost (LUMIGAN) 0.03 % ophthalmic solution 1 drop at bedtime.    . carvedilol (COREG) 25 MG tablet TAKE 1 TABLET(25 MG)  BY MOUTH TWICE DAILY 180 tablet 1  . cyclobenzaprine (FLEXERIL) 10 MG tablet Take 10 mg by mouth 3 (three) times daily as needed for muscle spasms.    . famotidine (PEPCID) 40 MG tablet TAKE 1 TABLET(40 MG) BY MOUTH TWICE DAILY 180 tablet 1  . furosemide (LASIX) 20 MG tablet Take 20 mg by mouth daily.    Marland Kitchen gabapentin (NEURONTIN) 300 MG capsule TAKE 1 CAPSULE(300 MG) BY MOUTH DAILY 90 capsule 0  . hydrALAZINE (APRESOLINE) 50 MG tablet TAKE 1 TABLET BY MOUTH THREE TIMES DAILY 270 tablet 0  . leflunomide (ARAVA) 20 MG tablet Take 20 mg by mouth daily.    Marland Kitchen levothyroxine (SYNTHROID) 175 MCG tablet Take one pill daily before breakfast 6 days a week and 1.5 pills one day a week before breakfast 92 tablet 1  . LUMIGAN 0.01 % SOLN INSTILL 1 DROP INTO BOTH EYES ONCE DAILY AT BEDTIME  0  . MELATONIN GUMMIES PO Take by mouth at bedtime as needed.    . Multiple Vitamin (MULTIVITAMIN WITH MINERALS) TABS Take 1 tablet by mouth daily.    Marland Kitchen spironolactone (ALDACTONE) 50 MG tablet TAKE 1/2 TABLET BY MOUTH ONCE DAILY 45 tablet 1  . SYMBICORT 160-4.5 MCG/ACT inhaler inhale 2 puffs if needed for wheezing 10.2 g 0  . telmisartan (MICARDIS) 40 MG tablet Take 40 mg by mouth daily. Taking half a tablet    . vitamin E 400 UNIT capsule Take 400 Units by mouth daily.     . [DISCONTINUED] modafinil (PROVIGIL) 100 MG tablet Take 100 mg by mouth  daily.    . [DISCONTINUED] potassium chloride (MICRO-K) 10 MEQ CR capsule Take 1 capsule (10 mEq total) by mouth 2 (two) times daily. 180 capsule 1   No current facility-administered medications on file prior to visit.    Past Medical History:  Diagnosis Date  . Anemia    Associated with leukopenia and lymphocytosis. This is been evaluated by Dr. Marin Olp . Her platelet count has been normal  . Arthritis   . Chronic back pain   . Chronic kidney disease   . Erythropoietin deficiency anemia 02/23/2016  . GERD (gastroesophageal reflux disease)   . Hypertension   . Lymphocytosis    . MGUS (monoclonal gammopathy of unknown significance) 08/03/2012   IgA 792 11/04/11  . Pneumonia 04/2010   OP  . Renal insufficiency   . Sarcoidosis   . Thyroid disease     Past Surgical History:  Procedure Laterality Date  . CARPAL TUNNEL RELEASE     Bilateral   . CATARACT EXTRACTION Right 05/2015  . CATARACT EXTRACTION Left   . CERVICAL DISCECTOMY  06/2010   Dr.Pool  . CHOLECYSTECTOMY    . COLONOSCOPY  2010  . POSTERIOR LUMBAR FUSION  02/29/2012   Dr Alyson Locket op respiratory & renal compromise  . THYROIDECTOMY  2003   For goiter  . UPPER GASTROINTESTINAL ENDOSCOPY  12/01/2011   DB    Social History   Socioeconomic History  . Marital status: Divorced    Spouse name: Not on file  . Number of children: 0  . Years of education: 88  . Highest education level: Not on file  Occupational History  . Occupation: Retired    Fish farm manager: RETIRED  Tobacco Use  . Smoking status: Former Smoker    Packs/day: 0.50    Years: 20.00    Pack years: 10.00    Types: Cigarettes    Quit date: 05/17/1997    Years since quitting: 22.7  . Smokeless tobacco: Never Used  Vaping Use  . Vaping Use: Never used  Substance and Sexual Activity  . Alcohol use: Yes    Alcohol/week: 1.0 standard drink    Types: 1 Glasses of wine per week    Comment: occasionally , less than weekly  . Drug use: No  . Sexual activity: Not Currently  Other Topics Concern  . Not on file  Social History Narrative   Patient is divorced and lives with her sister. Patient is retired.   Caffeine- sometimes - one cup   Right handed.   Social Determinants of Health   Financial Resource Strain:   . Difficulty of Paying Living Expenses: Not on file  Food Insecurity:   . Worried About Charity fundraiser in the Last Year: Not on file  . Ran Out of Food in the Last Year: Not on file  Transportation Needs:   . Lack of Transportation (Medical): Not on file  . Lack of Transportation (Non-Medical): Not on file  Physical  Activity:   . Days of Exercise per Week: Not on file  . Minutes of Exercise per Session: Not on file  Stress:   . Feeling of Stress : Not on file  Social Connections:   . Frequency of Communication with Friends and Family: Not on file  . Frequency of Social Gatherings with Friends and Family: Not on file  . Attends Religious Services: Not on file  . Active Member of Clubs or Organizations: Not on file  . Attends Archivist Meetings: Not on file  .  Marital Status: Not on file    Family History  Problem Relation Age of Onset  . Anemia Father   . Arthritis Father   . Heart failure Father 76       No CAD  . Diabetes Mother   . Arthritis Mother   . Glaucoma Mother   . Heart disease Mother 78       No CAD  . Hypertension Brother   . Anemia Brother   . Kidney disease Brother   . Hypertension Sister   . Kidney disease Sister   . Anemia Sister   . Diabetes Maternal Aunt   . Arthritis Paternal Grandmother   . Colon cancer Neg Hx   . Colon polyps Neg Hx   . Esophageal cancer Neg Hx   . Rectal cancer Neg Hx   . Stomach cancer Neg Hx     Review of Systems  Constitutional: Positive for fatigue. Negative for chills and fever.  Respiratory: Positive for wheezing (at night). Negative for cough and shortness of breath.   Cardiovascular: Positive for palpitations (occ, transient). Negative for chest pain and leg swelling.  Skin:       Hair loss - front of head  Neurological: Negative for light-headedness and headaches.       Objective:   Vitals:   02/15/20 1309  BP: 138/70  Pulse: 85  Temp: 98.6 F (37 C)  SpO2: 96%   BP Readings from Last 3 Encounters:  02/15/20 138/70  01/11/20 (!) 150/75  11/27/19 132/78   Wt Readings from Last 3 Encounters:  02/15/20 203 lb 3.2 oz (92.2 kg)  01/11/20 203 lb 6.4 oz (92.3 kg)  11/27/19 204 lb 8 oz (92.8 kg)   Body mass index is 34.88 kg/m.     Office Visit from 02/15/2020 in [place] at [facility]  PHQ-2  Total Score 0        Physical Exam    Constitutional: Appears well-developed and well-nourished. No distress.  HENT:  Head: Normocephalic and atraumatic.  Neck: Neck supple. No tracheal deviation present. No thyromegaly present.  No cervical lymphadenopathy Cardiovascular: Normal rate, regular rhythm and normal heart sounds.   No murmur heard. No carotid bruit .  No edema Pulmonary/Chest: Effort normal and breath sounds normal. No respiratory distress. No has no wheezes. No rales.  Skin: Skin is warm and dry. Not diaphoretic.  Psychiatric: Normal mood and affect. Behavior is normal.      Assessment & Plan:    Screened for depression using the PHQ 2 scale.  No evidence of depression.    See Problem List for Assessment and Plan of chronic medical problems.    This visit occurred during the SARS-CoV-2 public health emergency.  Safety protocols were in place, including screening questions prior to the visit, additional usage of staff PPE, and extensive cleaning of exam room while observing appropriate contact time as indicated for disinfecting solutions.

## 2020-02-15 ENCOUNTER — Other Ambulatory Visit: Payer: Self-pay

## 2020-02-15 ENCOUNTER — Ambulatory Visit (INDEPENDENT_AMBULATORY_CARE_PROVIDER_SITE_OTHER): Payer: Medicare Other | Admitting: Internal Medicine

## 2020-02-15 ENCOUNTER — Encounter: Payer: Self-pay | Admitting: Internal Medicine

## 2020-02-15 VITALS — BP 138/70 | HR 85 | Temp 98.6°F | Wt 203.2 lb

## 2020-02-15 DIAGNOSIS — E7849 Other hyperlipidemia: Secondary | ICD-10-CM

## 2020-02-15 DIAGNOSIS — I10 Essential (primary) hypertension: Secondary | ICD-10-CM | POA: Diagnosis not present

## 2020-02-15 DIAGNOSIS — K219 Gastro-esophageal reflux disease without esophagitis: Secondary | ICD-10-CM | POA: Diagnosis not present

## 2020-02-15 DIAGNOSIS — N184 Chronic kidney disease, stage 4 (severe): Secondary | ICD-10-CM

## 2020-02-15 DIAGNOSIS — Z23 Encounter for immunization: Secondary | ICD-10-CM

## 2020-02-15 DIAGNOSIS — E890 Postprocedural hypothyroidism: Secondary | ICD-10-CM

## 2020-02-15 DIAGNOSIS — R739 Hyperglycemia, unspecified: Secondary | ICD-10-CM

## 2020-02-15 LAB — TSH: TSH: 8.56 u[IU]/mL — ABNORMAL HIGH (ref 0.35–4.50)

## 2020-02-15 NOTE — Assessment & Plan Note (Signed)
Chronic GERD controlled Continue pepcid 40 mg BID

## 2020-02-15 NOTE — Assessment & Plan Note (Signed)
Chronic  Clinically euthyroid Check tsh  Continue levothyroxine 175 mcg daily Titrate med dose if needed

## 2020-02-15 NOTE — Assessment & Plan Note (Addendum)
Chronic BP well controlled Continue hydralazine 50 mg TID, telmisartan 20 mg daily, lasix 20 mg daily, coreg 25 mg BID cmp

## 2020-02-16 NOTE — Assessment & Plan Note (Signed)
Chronic Sugars have been controlled Encouraged regular exercise Advised low sugar/carb diet

## 2020-02-16 NOTE — Assessment & Plan Note (Signed)
Chronic Check lipid panel  Continue atorvastatin 20 mg daily Regular exercise and healthy diet encouraged  

## 2020-02-16 NOTE — Assessment & Plan Note (Signed)
Chronic Following with [facility]

## 2020-02-17 MED ORDER — LEVOTHYROXINE SODIUM 200 MCG PO TABS: 200.0000 ug | ORAL_TABLET | Freq: Every day | ORAL | 1 refills | Status: DC

## 2020-02-17 NOTE — Addendum Note (Signed)
Addended by: Binnie Rail on: 02/17/2020 06:43 PM   Modules accepted: Orders

## 2020-02-20 ENCOUNTER — Telehealth: Payer: Self-pay | Admitting: Internal Medicine

## 2020-02-20 ENCOUNTER — Other Ambulatory Visit: Payer: Self-pay

## 2020-02-20 MED ORDER — SPIRONOLACTONE 50 MG PO TABS: ORAL_TABLET | ORAL | 1 refills | Status: DC

## 2020-02-20 NOTE — Telephone Encounter (Signed)
Faxed in today. 

## 2020-02-20 NOTE — Telephone Encounter (Signed)
   Patient requesting refill for spironolactone (ALDACTONE) 50 MG tablet [place] [address] [place] Last ov 02/15/20

## 2020-02-21 ENCOUNTER — Other Ambulatory Visit: Payer: Self-pay

## 2020-02-21 ENCOUNTER — Inpatient Hospital Stay: Payer: Medicare Other

## 2020-02-21 ENCOUNTER — Encounter: Payer: Self-pay | Admitting: Family

## 2020-02-21 ENCOUNTER — Inpatient Hospital Stay: Payer: Medicare Other | Attending: Hematology & Oncology

## 2020-02-21 ENCOUNTER — Telehealth: Payer: Self-pay | Admitting: Internal Medicine

## 2020-02-21 ENCOUNTER — Inpatient Hospital Stay (HOSPITAL_BASED_OUTPATIENT_CLINIC_OR_DEPARTMENT_OTHER): Payer: Medicare Other | Admitting: Family

## 2020-02-21 VITALS — BP 127/62 | HR 85 | Temp 98.2°F | Resp 18 | Ht 64.0 in | Wt 206.0 lb

## 2020-02-21 DIAGNOSIS — Z7982 Long term (current) use of aspirin: Secondary | ICD-10-CM | POA: Insufficient documentation

## 2020-02-21 DIAGNOSIS — Z79899 Other long term (current) drug therapy: Secondary | ICD-10-CM | POA: Diagnosis not present

## 2020-02-21 DIAGNOSIS — D508 Other iron deficiency anemias: Secondary | ICD-10-CM

## 2020-02-21 DIAGNOSIS — D631 Anemia in chronic kidney disease: Secondary | ICD-10-CM | POA: Diagnosis not present

## 2020-02-21 DIAGNOSIS — N182 Chronic kidney disease, stage 2 (mild): Secondary | ICD-10-CM

## 2020-02-21 DIAGNOSIS — D509 Iron deficiency anemia, unspecified: Secondary | ICD-10-CM | POA: Insufficient documentation

## 2020-02-21 DIAGNOSIS — E611 Iron deficiency: Secondary | ICD-10-CM | POA: Insufficient documentation

## 2020-02-21 DIAGNOSIS — D472 Monoclonal gammopathy: Secondary | ICD-10-CM

## 2020-02-21 DIAGNOSIS — R5383 Other fatigue: Secondary | ICD-10-CM | POA: Insufficient documentation

## 2020-02-21 LAB — CBC WITH DIFFERENTIAL (CANCER CENTER ONLY)
Abs Immature Granulocytes: 0.02 10*3/uL (ref 0.00–0.07)
Basophils Absolute: 0 10*3/uL (ref 0.0–0.1)
Basophils Relative: 1 %
Eosinophils Absolute: 0.1 10*3/uL (ref 0.0–0.5)
Eosinophils Relative: 3 %
HCT: 32.3 % — ABNORMAL LOW (ref 36.0–46.0)
Hemoglobin: 10.3 g/dL — ABNORMAL LOW (ref 12.0–15.0)
Immature Granulocytes: 1 %
Lymphocytes Relative: 53 %
Lymphs Abs: 2.2 10*3/uL (ref 0.7–4.0)
MCH: 32.5 pg (ref 26.0–34.0)
MCHC: 31.9 g/dL (ref 30.0–36.0)
MCV: 101.9 fL — ABNORMAL HIGH (ref 80.0–100.0)
Monocytes Absolute: 0.5 10*3/uL (ref 0.1–1.0)
Monocytes Relative: 13 %
Neutro Abs: 1.2 10*3/uL — ABNORMAL LOW (ref 1.7–7.7)
Neutrophils Relative %: 29 %
Platelet Count: 145 10*3/uL — ABNORMAL LOW (ref 150–400)
RBC: 3.17 MIL/uL — ABNORMAL LOW (ref 3.87–5.11)
RDW: 14.1 % (ref 11.5–15.5)
WBC Count: 4 10*3/uL (ref 4.0–10.5)
nRBC: 0 % (ref 0.0–0.2)

## 2020-02-21 LAB — CMP (CANCER CENTER ONLY)
ALT: 11 U/L (ref 0–44)
AST: 16 U/L (ref 15–41)
Albumin: 4.4 g/dL (ref 3.5–5.0)
Alkaline Phosphatase: 61 U/L (ref 38–126)
Anion gap: 9 (ref 5–15)
BUN: 49 mg/dL — ABNORMAL HIGH (ref 8–23)
CO2: 28 mmol/L (ref 22–32)
Calcium: 9.4 mg/dL (ref 8.9–10.3)
Chloride: 107 mmol/L (ref 98–111)
Creatinine: 2.15 mg/dL — ABNORMAL HIGH (ref 0.44–1.00)
GFR, Estimated: 22 mL/min — ABNORMAL LOW (ref >60)
Glucose, Bld: 107 mg/dL — ABNORMAL HIGH (ref 70–99)
Potassium: 4.6 mmol/L (ref 3.5–5.1)
Sodium: 144 mmol/L (ref 135–145)
Total Bilirubin: 0.4 mg/dL (ref 0.3–1.2)
Total Protein: 7.1 g/dL (ref 6.5–8.1)

## 2020-02-21 LAB — RETICULOCYTES
Immature Retic Fract: 8.6 % (ref 2.3–15.9)
RBC.: 3.12 MIL/uL — ABNORMAL LOW (ref 3.87–5.11)
Retic Count, Absolute: 31.5 10*3/uL (ref 19.0–186.0)
Retic Ct Pct: 1 % (ref 0.4–3.1)

## 2020-02-21 MED ORDER — DARBEPOETIN ALFA 300 MCG/0.6ML IJ SOSY
300.0000 ug | PREFILLED_SYRINGE | Freq: Once | INTRAMUSCULAR | Status: AC
  Administered 2020-02-21: 300 ug via SUBCUTANEOUS

## 2020-02-21 NOTE — Progress Notes (Signed)
  Chronic Care Management   Outreach Note  02/21/2020 Name: Tina Molina MRN: [id number] DOB: December 29, 1945  Referred by: Binnie Rail, MD Reason for referral : No chief complaint on file.   An unsuccessful telephone outreach was attempted today. The patient was referred to the pharmacist for assistance with care management and care coordination.   Follow Up Plan:   Carley Perdue [employer] Scheduler

## 2020-02-21 NOTE — Patient Instructions (Signed)
Darbepoetin Alfa injection What is this medicine? DARBEPOETIN ALFA (dar be POE e tin AL fa) helps your body make more red blood cells. It is used to treat anemia caused by chronic kidney failure and chemotherapy. This medicine may be used for other purposes; ask your health care provider or pharmacist if you have questions. COMMON BRAND NAME(S): Aranesp What should I tell my health care provider before I take this medicine? They need to know if you have any of these conditions:  blood clotting disorders or history of blood clots  cancer patient not on chemotherapy  cystic fibrosis  heart disease, such as angina, heart failure, or a history of a heart attack  hemoglobin level of 12 g/dL or greater  high blood pressure  low levels of folate, iron, or vitamin B12  seizures  an unusual or allergic reaction to darbepoetin, erythropoietin, albumin, hamster proteins, latex, other medicines, foods, dyes, or preservatives  pregnant or trying to get pregnant  breast-feeding How should I use this medicine? This medicine is for injection into a vein or under the skin. It is usually given by a health care professional in a hospital or clinic setting. If you get this medicine at home, you will be taught how to prepare and give this medicine. Use exactly as directed. Take your medicine at regular intervals. Do not take your medicine more often than directed. It is important that you put your used needles and syringes in a special sharps container. Do not put them in a trash can. If you do not have a sharps container, call your pharmacist or healthcare provider to get one. A special MedGuide will be given to you by the pharmacist with each prescription and refill. Be sure to read this information carefully each time. Talk to your pediatrician regarding the use of this medicine in children. While this medicine may be used in children as young as 1 month of age for selected conditions, precautions do  apply. Overdosage: If you think you have taken too much of this medicine contact a poison control center or emergency room at once. NOTE: This medicine is only for you. Do not share this medicine with others. What if I miss a dose? If you miss a dose, take it as soon as you can. If it is almost time for your next dose, take only that dose. Do not take double or extra doses. What may interact with this medicine? Do not take this medicine with any of the following medications:  epoetin alfa This list may not describe all possible interactions. Give your health care provider a list of all the medicines, herbs, non-prescription drugs, or dietary supplements you use. Also tell them if you smoke, drink alcohol, or use illegal drugs. Some items may interact with your medicine. What should I watch for while using this medicine? Your condition will be monitored carefully while you are receiving this medicine. You may need blood work done while you are taking this medicine. This medicine may cause a decrease in vitamin B6. You should make sure that you get enough vitamin B6 while you are taking this medicine. Discuss the foods you eat and the vitamins you take with your health care professional. What side effects may I notice from receiving this medicine? Side effects that you should report to your doctor or health care professional as soon as possible:  allergic reactions like skin rash, itching or hives, swelling of the face, lips, or tongue  breathing problems  changes in   vision  chest pain  confusion, trouble speaking or understanding  feeling faint or lightheaded, falls  high blood pressure  muscle aches or pains  pain, swelling, warmth in the leg  rapid weight gain  severe headaches  sudden numbness or weakness of the face, arm or leg  trouble walking, dizziness, loss of balance or coordination  seizures (convulsions)  swelling of the ankles, feet, hands  unusually weak or  tired Side effects that usually do not require medical attention (report to your doctor or health care professional if they continue or are bothersome):  diarrhea  fever, chills (flu-like symptoms)  headaches  nausea, vomiting  redness, stinging, or swelling at site where injected This list may not describe all possible side effects. Call your doctor for medical advice about side effects. You may report side effects to FDA at 1-800-FDA-1088. Where should I keep my medicine? Keep out of the reach of children. Store in a refrigerator between 2 and 8 degrees C (36 and 46 degrees F). Do not freeze. Do not shake. Throw away any unused portion if using a single-dose vial. Throw away any unused medicine after the expiration date. NOTE: This sheet is a summary. It may not cover all possible information. If you have questions about this medicine, talk to your doctor, pharmacist, or health care provider.  2020 Elsevier/Gold Standard (2017-05-18 16:44:20)  

## 2020-02-21 NOTE — Progress Notes (Signed)
Hematology and Oncology Follow Up Visit  AUTUM Molina [id number] 1945-07-08 74 y.o. 02/21/2020   Principle Diagnosis:  Anemia of chronic renal failure - stage II MGUS - IgA lambda Iron deficiency anemia  Current Therapy: Aranesp 300 mcg SQ to keep Hgb > 11 IV iron as indicated   Interim History:  [name] is here today for follow-up. She is doing well but notes fatigue at this time.  She has not noted any blood loss. No petechiae.  She sometimes has SOB with over exertion and will take a break to rest when needed.  No fever, chills, n/v, cough, rash, dizziness, chest pain, palpitations, abdominal pain or changes in bowel or bladder habits.  In August her M-spike was 0.3 g/dL, IgA level 583 mg/dL and lambda light chains 3.75 mg/dL.  No swelling or tenderness in her extremities.  She has numbness and tinging in her toes since a previous back surgery.  She did have a fall 2 weeks ago in the shower. The floor was slippery when she stepped out. Thankfully she was not seriously injured, only a few bruises on her back.  No syncope.  She has maintained a good appetite and is staying well hydrated. Her weight is stable.   ECOG Performance Status: 1 - Symptomatic but completely ambulatory  Medications:  Allergies as of 02/21/2020      Reactions   Penicillins Other (See Comments)   Intolerance or allergy not remembered; her sister stated penicillin caused "boils under the skin"   Shellfish Allergy Rash   Shellfish-derived Products Anaphylaxis   Codeine Nausea Only   Nausea only with liquid codeine   Infliximab Other (See Comments)    Confusion (intolerance)   Sulfonamide Derivatives Rash   Azithromycin Itching      Medication List       Accurate as of February 21, 2020  3:06 PM. If you have any questions, ask your nurse or doctor.        aspirin 81 MG tablet Take 81 mg by mouth daily.   atorvastatin 20 MG tablet Commonly known as: LIPITOR TAKE 1 TABLET(20 MG) BY  MOUTH DAILY   carvedilol 25 MG tablet Commonly known as: COREG TAKE 1 TABLET(25 MG) BY MOUTH TWICE DAILY   cyclobenzaprine 10 MG tablet Commonly known as: FLEXERIL Take 10 mg by mouth 3 (three) times daily as needed for muscle spasms.   famotidine 40 MG tablet Commonly known as: PEPCID TAKE 1 TABLET(40 MG) BY MOUTH TWICE DAILY   furosemide 20 MG tablet Commonly known as: LASIX Take 20 mg by mouth daily.   gabapentin 300 MG capsule Commonly known as: NEURONTIN TAKE 1 CAPSULE(300 MG) BY MOUTH DAILY   hydrALAZINE 50 MG tablet Commonly known as: APRESOLINE TAKE 1 TABLET BY MOUTH THREE TIMES DAILY   leflunomide 20 MG tablet Commonly known as: ARAVA Take 20 mg by mouth daily.   levothyroxine 200 MCG tablet Commonly known as: Synthroid Take 1 tablet (200 mcg total) by mouth daily before breakfast.   Lumigan 0.01 % Soln Generic drug: bimatoprost INSTILL 1 DROP INTO BOTH EYES ONCE DAILY AT BEDTIME   bimatoprost 0.03 % ophthalmic solution Commonly known as: LUMIGAN 1 drop at bedtime.   MELATONIN GUMMIES PO Take by mouth at bedtime as needed.   multivitamin with minerals Tabs tablet Take 1 tablet by mouth daily.   spironolactone 50 MG tablet Commonly known as: ALDACTONE TAKE 1/2 TABLET BY MOUTH ONCE DAILY   Symbicort 160-4.5 MCG/ACT inhaler Generic drug: budesonide-formoterol  inhale 2 puffs if needed for wheezing   telmisartan 40 MG tablet Commonly known as: MICARDIS Take 40 mg by mouth daily. Taking half a tablet   vitamin E 180 MG (400 UNITS) capsule Take 400 Units by mouth daily.       Allergies:  Allergies  Allergen Reactions  . Penicillins Other (See Comments)    Intolerance or allergy not remembered; her sister stated penicillin caused "boils under the skin"  . Shellfish Allergy Rash  . Shellfish-Derived Products Anaphylaxis  . Codeine Nausea Only    Nausea only with liquid codeine  . Infliximab Other (See Comments)     Confusion (intolerance)    . Sulfonamide Derivatives Rash  . Azithromycin Itching    Past Medical History, Surgical history, Social history, and Family History were reviewed and updated.  Review of Systems: All other 10 point review of systems is negative.   Physical Exam:  vitals were not taken for this visit.   Wt Readings from Last 3 Encounters:  02/15/20 203 lb 3.2 oz (92.2 kg)  01/11/20 203 lb 6.4 oz (92.3 kg)  11/27/19 204 lb 8 oz (92.8 kg)    Ocular: Sclerae unicteric, pupils equal, round and reactive to light Ear-nose-throat: Oropharynx clear, dentition fair Lymphatic: No cervical or supraclavicular adenopathy Lungs no rales or rhonchi, good excursion bilaterally Heart regular rate and rhythm, no murmur appreciated Abd soft, nontender, positive bowel sounds MSK no focal spinal tenderness, no joint edema Neuro: non-focal, well-oriented, appropriate affect Breasts: Deferred   Lab Results  Component Value Date   WBC 4.0 02/21/2020   HGB 10.3 (L) 02/21/2020   HCT 32.3 (L) 02/21/2020   MCV 101.9 (H) 02/21/2020   PLT 145 (L) 02/21/2020   Lab Results  Component Value Date   FERRITIN 233 01/11/2020   IRON 113 01/11/2020   TIBC 260 01/11/2020   UIBC 147 01/11/2020   IRONPCTSAT 44 01/11/2020   Lab Results  Component Value Date   RETICCTPCT 1.0 02/21/2020   RBC 3.17 (L) 02/21/2020   RBC 3.12 (L) 02/21/2020   RETICCTABS 51.6 01/03/2013   Lab Results  Component Value Date   KPAFRELGTCHN 25.9 (H) 01/11/2020   LAMBDASER 37.5 (H) 01/11/2020   KAPLAMBRATIO 0.69 01/11/2020   Lab Results  Component Value Date   IGGSERUM 962 01/11/2020   IGA 583 (H) 01/11/2020   IGMSERUM 25 (L) 01/11/2020   Lab Results  Component Value Date   TOTALPROTELP 7.0 01/11/2020   ALBUMINELP 3.9 01/11/2020   A1GS 0.2 01/11/2020   A2GS 0.7 01/11/2020   BETS 1.3 01/11/2020   BETA2SER 10.3 (H) 11/04/2011   GAMS 0.9 01/11/2020   MSPIKE 0.3 (H) 01/11/2020   SPEI Comment 01/11/2020     Chemistry       Component Value Date/Time   NA 142 01/11/2020 1349   NA 150 (H) 03/01/2017 1308   NA 143 04/05/2016 1156   K 4.1 01/11/2020 1349   K 4.0 03/01/2017 1308   K 3.8 04/05/2016 1156   CL 108 01/11/2020 1349   CL 109 (H) 03/01/2017 1308   CO2 26 01/11/2020 1349   CO2 28 03/01/2017 1308   CO2 21 (L) 04/05/2016 1156   BUN 28 (H) 01/11/2020 1349   BUN 19 03/01/2017 1308   BUN 18.5 04/05/2016 1156   CREATININE 1.93 (H) 01/11/2020 1349   CREATININE 1.8 (H) 03/01/2017 1308   CREATININE 1.5 (H) 04/05/2016 1156      Component Value Date/Time   CALCIUM 9.5 01/11/2020 1349  CALCIUM 9.7 03/01/2017 1308   CALCIUM 9.3 04/05/2016 1156   ALKPHOS 62 01/11/2020 1349   ALKPHOS 55 03/01/2017 1308   ALKPHOS 77 04/05/2016 1156   AST 16 01/11/2020 1349   AST 21 04/05/2016 1156   ALT 15 01/11/2020 1349   ALT 18 03/01/2017 1308   ALT 22 04/05/2016 1156   BILITOT 0.4 01/11/2020 1349   BILITOT 0.36 04/05/2016 1156       Impression and Plan: Ms. Sobecki is a very pleasant 41yoAfrican American female with multifactorial anemia as well as an IgA lambda MGUS. She received Aranesp today for Hgb 10.3.  Iron studies are pending. We will replace if needed.  Follow-up in 6 weeks.  She can contact our office with any questions or concerns.   Laverna Peace, NP 10/7/20213:06 PM

## 2020-02-22 ENCOUNTER — Other Ambulatory Visit: Payer: Self-pay | Admitting: Internal Medicine

## 2020-02-22 LAB — IRON AND TIBC
Iron: 109 ug/dL (ref 41–142)
Saturation Ratios: 38 % (ref 21–57)
TIBC: 284 ug/dL (ref 236–444)
UIBC: 175 ug/dL (ref 120–384)

## 2020-02-22 LAB — FERRITIN: Ferritin: 205 ng/mL (ref 11–307)

## 2020-02-25 ENCOUNTER — Telehealth: Payer: Self-pay | Admitting: Internal Medicine

## 2020-02-25 NOTE — Progress Notes (Signed)
°  Chronic Care Management   Note  02/25/2020 Name: Tina Molina MRN: [id number] DOB: [birth year]  Tina Molina is a 74 y.o. year old female who is a primary care patient of Burns, Claudina Lick, MD. I reached out to Gailen Shelter by phone today in response to a referral sent by [name]'s PCP, Binnie Rail, MD.   [name] was given information about Chronic Care Management services today including:  1. CCM service includes personalized support from designated clinical staff supervised by her physician, including individualized plan of care and coordination with other care providers 2. 24/7 contact phone numbers for assistance for urgent and routine care needs. 3. Service will only be billed when office clinical staff spend 20 minutes or more in a month to coordinate care. 4. Only one practitioner may furnish and bill the service in a calendar month. 5. The patient may stop CCM services at any time (effective at the end of the month) by phone call to the office staff.   Patient agreed to services and verbal consent obtained.   Follow up plan:   Carley Perdue [employer] Scheduler

## 2020-03-02 ENCOUNTER — Other Ambulatory Visit: Payer: Self-pay | Admitting: Internal Medicine

## 2020-03-03 DIAGNOSIS — H40053 Ocular hypertension, bilateral: Secondary | ICD-10-CM | POA: Diagnosis not present

## 2020-03-03 DIAGNOSIS — H26493 Other secondary cataract, bilateral: Secondary | ICD-10-CM | POA: Diagnosis not present

## 2020-03-03 DIAGNOSIS — H16223 Keratoconjunctivitis sicca, not specified as Sjogren's, bilateral: Secondary | ICD-10-CM | POA: Diagnosis not present

## 2020-03-04 ENCOUNTER — Other Ambulatory Visit: Payer: Self-pay | Admitting: Internal Medicine

## 2020-03-13 ENCOUNTER — Telehealth: Payer: Self-pay | Admitting: Internal Medicine

## 2020-03-13 ENCOUNTER — Other Ambulatory Visit: Payer: Self-pay

## 2020-03-13 MED ORDER — CARVEDILOL 25 MG PO TABS: ORAL_TABLET | ORAL | 1 refills | Status: DC

## 2020-03-13 MED ORDER — ATORVASTATIN CALCIUM 20 MG PO TABS: ORAL_TABLET | ORAL | 1 refills | Status: DC

## 2020-03-13 NOTE — Telephone Encounter (Signed)
I do not think I have prescribed either of these  -- the eye drop has to come from her eye doctor.  Not sure who last prescribed the flexeril

## 2020-03-13 NOTE — Telephone Encounter (Signed)
atorvastatin (LIPITOR) 20 MG tablet carvedilol (COREG) 25 MG tablet bimatoprost (LUMIGAN) 0.03 % ophthalmic solution cyclobenzaprine (FLEXERIL) 10 MG tablet  [facility] [place], [place] Phone:  [phone]  Fax:  [phone]     Patient requesting refills

## 2020-03-17 NOTE — Telephone Encounter (Signed)
Message left for patient today that she would need to get drops from eye doctor and we would not be able to refill Flexeril as Dr. Quay Burow is not the original prescriber.

## 2020-03-23 ENCOUNTER — Telehealth: Payer: Self-pay | Admitting: Internal Medicine

## 2020-03-23 DIAGNOSIS — I10 Essential (primary) hypertension: Secondary | ICD-10-CM

## 2020-03-27 DIAGNOSIS — M5136 Other intervertebral disc degeneration, lumbar region: Secondary | ICD-10-CM | POA: Diagnosis not present

## 2020-03-27 DIAGNOSIS — Z6834 Body mass index (BMI) 34.0-34.9, adult: Secondary | ICD-10-CM | POA: Diagnosis not present

## 2020-03-27 DIAGNOSIS — M150 Primary generalized (osteo)arthritis: Secondary | ICD-10-CM | POA: Diagnosis not present

## 2020-03-27 DIAGNOSIS — M0589 Other rheumatoid arthritis with rheumatoid factor of multiple sites: Secondary | ICD-10-CM | POA: Diagnosis not present

## 2020-03-27 DIAGNOSIS — E669 Obesity, unspecified: Secondary | ICD-10-CM | POA: Diagnosis not present

## 2020-03-27 DIAGNOSIS — N184 Chronic kidney disease, stage 4 (severe): Secondary | ICD-10-CM | POA: Diagnosis not present

## 2020-03-27 DIAGNOSIS — D860 Sarcoidosis of lung: Secondary | ICD-10-CM | POA: Diagnosis not present

## 2020-03-28 DIAGNOSIS — Z23 Encounter for immunization: Secondary | ICD-10-CM | POA: Diagnosis not present

## 2020-04-03 ENCOUNTER — Inpatient Hospital Stay: Payer: Medicare Other

## 2020-04-03 ENCOUNTER — Other Ambulatory Visit: Payer: Self-pay

## 2020-04-03 ENCOUNTER — Inpatient Hospital Stay: Payer: Medicare Other | Attending: Hematology & Oncology | Admitting: Family

## 2020-04-03 DIAGNOSIS — D508 Other iron deficiency anemias: Secondary | ICD-10-CM | POA: Diagnosis not present

## 2020-04-03 DIAGNOSIS — N182 Chronic kidney disease, stage 2 (mild): Secondary | ICD-10-CM

## 2020-04-03 DIAGNOSIS — D631 Anemia in chronic kidney disease: Secondary | ICD-10-CM | POA: Diagnosis not present

## 2020-04-03 DIAGNOSIS — D472 Monoclonal gammopathy: Secondary | ICD-10-CM

## 2020-04-03 LAB — CMP (CANCER CENTER ONLY)
ALT: 22 U/L (ref 0–44)
AST: 20 U/L (ref 15–41)
Albumin: 4.3 g/dL (ref 3.5–5.0)
Alkaline Phosphatase: 65 U/L (ref 38–126)
Anion gap: 7 (ref 5–15)
BUN: 35 mg/dL — ABNORMAL HIGH (ref 8–23)
CO2: 26 mmol/L (ref 22–32)
Calcium: 9.5 mg/dL (ref 8.9–10.3)
Chloride: 108 mmol/L (ref 98–111)
Creatinine: 2.21 mg/dL — ABNORMAL HIGH (ref 0.44–1.00)
GFR, Estimated: 23 mL/min — ABNORMAL LOW (ref >60)
Glucose, Bld: 94 mg/dL (ref 70–99)
Potassium: 4.9 mmol/L (ref 3.5–5.1)
Sodium: 141 mmol/L (ref 135–145)
Total Bilirubin: 0.3 mg/dL (ref 0.3–1.2)
Total Protein: 7.5 g/dL (ref 6.5–8.1)

## 2020-04-03 LAB — CBC WITH DIFFERENTIAL (CANCER CENTER ONLY)
Abs Immature Granulocytes: 0.04 10*3/uL (ref 0.00–0.07)
Basophils Absolute: 0 10*3/uL (ref 0.0–0.1)
Basophils Relative: 1 %
Eosinophils Absolute: 0.1 10*3/uL (ref 0.0–0.5)
Eosinophils Relative: 2 %
HCT: 33.3 % — ABNORMAL LOW (ref 36.0–46.0)
Hemoglobin: 10.7 g/dL — ABNORMAL LOW (ref 12.0–15.0)
Immature Granulocytes: 1 %
Lymphocytes Relative: 51 %
Lymphs Abs: 2.5 10*3/uL (ref 0.7–4.0)
MCH: 32.1 pg (ref 26.0–34.0)
MCHC: 32.1 g/dL (ref 30.0–36.0)
MCV: 100 fL (ref 80.0–100.0)
Monocytes Absolute: 0.6 10*3/uL (ref 0.1–1.0)
Monocytes Relative: 11 %
Neutro Abs: 1.7 10*3/uL (ref 1.7–7.7)
Neutrophils Relative %: 34 %
Platelet Count: 142 10*3/uL — ABNORMAL LOW (ref 150–400)
RBC: 3.33 MIL/uL — ABNORMAL LOW (ref 3.87–5.11)
RDW: 13.2 % (ref 11.5–15.5)
WBC Count: 5 10*3/uL (ref 4.0–10.5)
nRBC: 0 % (ref 0.0–0.2)

## 2020-04-03 LAB — RETICULOCYTES
Immature Retic Fract: 8.6 % (ref 2.3–15.9)
RBC.: 3.33 MIL/uL — ABNORMAL LOW (ref 3.87–5.11)
Retic Count, Absolute: 27.6 10*3/uL (ref 19.0–186.0)
Retic Ct Pct: 0.8 % (ref 0.4–3.1)

## 2020-04-03 MED ORDER — DARBEPOETIN ALFA 300 MCG/0.6ML IJ SOSY
300.0000 ug | PREFILLED_SYRINGE | Freq: Once | INTRAMUSCULAR | Status: AC
  Administered 2020-04-03: 300 ug via SUBCUTANEOUS

## 2020-04-03 NOTE — Progress Notes (Signed)
Hematology and Oncology Follow Up Visit  Tina Molina [id number] [birth year] 74 y.o. 04/03/2020   Principle Diagnosis:  Anemia of chronic renal failure - stage II MGUS - IgA lambda Iron deficiency anemia  Current Therapy: Aranesp 300 mcg SQ to keep Hgb > 11 IV iron as indicated   Interim History:  Tina Molina is here today for follow-up and Aranesp injection. She still has some fatigue.  No fever, chills, n/v, cough, rash, dizziness, SOB, chest pain, palpitations, abdominal pain or changes in bowel or bladder habits.  No blood loss noted. No bruising or petechiae.  No swelling or tenderness in her extremities at this time.  Numbness and tingling in her toes from previous back surgery is unchanged.  No falls or syncope.  She has maintained a good appetite and is staying well hydrated. Her weight is stable.   ECOG Performance Status: 1 - Symptomatic but completely ambulatory  Medications:  Allergies as of 04/03/2020      Reactions   Penicillins Other (See Comments)   Intolerance or allergy not remembered; her sister stated penicillin caused "boils under the skin"   Shellfish Allergy Rash   Shellfish-derived Products Anaphylaxis   Codeine Nausea Only   Nausea only with liquid codeine   Infliximab Other (See Comments)    Confusion (intolerance)   Sulfonamide Derivatives Rash   Azithromycin Itching      Medication List       Accurate as of April 03, 2020  2:01 PM. If you have any questions, ask your nurse or doctor.        aspirin 81 MG tablet Take 81 mg by mouth daily.   atorvastatin 20 MG tablet Commonly known as: LIPITOR TAKE 1 TABLET(20 MG) BY MOUTH DAILY   carvedilol 25 MG tablet Commonly known as: COREG TAKE 1 TABLET(25 MG) BY MOUTH TWICE DAILY   cyclobenzaprine 10 MG tablet Commonly known as: FLEXERIL Take 10 mg by mouth 3 (three) times daily as needed for muscle spasms.   famotidine 40 MG tablet Commonly known as: PEPCID TAKE 1 TABLET(40  MG) BY MOUTH TWICE DAILY   furosemide 20 MG tablet Commonly known as: LASIX Take 20 mg by mouth daily.   gabapentin 300 MG capsule Commonly known as: NEURONTIN TAKE 1 CAPSULE(300 MG) BY MOUTH DAILY   hydrALAZINE 50 MG tablet Commonly known as: APRESOLINE TAKE 1 TABLET BY MOUTH THREE TIMES DAILY   leflunomide 20 MG tablet Commonly known as: ARAVA Take 20 mg by mouth daily.   levothyroxine 200 MCG tablet Commonly known as: Synthroid Take 1 tablet (200 mcg total) by mouth daily before breakfast.   Lumigan 0.01 % Soln Generic drug: bimatoprost INSTILL 1 DROP INTO BOTH EYES ONCE DAILY AT BEDTIME   bimatoprost 0.03 % ophthalmic solution Commonly known as: LUMIGAN 1 drop at bedtime.   MELATONIN GUMMIES PO Take by mouth at bedtime as needed.   multivitamin with minerals Tabs tablet Take 1 tablet by mouth daily.   spironolactone 50 MG tablet Commonly known as: ALDACTONE TAKE 1/2 TABLET BY MOUTH ONCE DAILY   Symbicort 160-4.5 MCG/ACT inhaler Generic drug: budesonide-formoterol inhale 2 puffs if needed for wheezing   telmisartan 40 MG tablet Commonly known as: MICARDIS Take 40 mg by mouth daily. Taking half a tablet   telmisartan 80 MG tablet Commonly known as: MICARDIS TAKE 1 TABLET(80 MG) BY MOUTH DAILY   vitamin E 180 MG (400 UNITS) capsule Take 400 Units by mouth daily.       Allergies:  Allergies  Allergen Reactions  . Penicillins Other (See Comments)    Intolerance or allergy not remembered; her sister stated penicillin caused "boils under the skin"  . Shellfish Allergy Rash  . Shellfish-Derived Products Anaphylaxis  . Codeine Nausea Only    Nausea only with liquid codeine  . Infliximab Other (See Comments)     Confusion (intolerance)  . Sulfonamide Derivatives Rash  . Azithromycin Itching    Past Medical History, Surgical history, Social history, and Family History were reviewed and updated.  Review of Systems: All other 10 point review of  systems is negative.   Physical Exam:  vitals were not taken for this visit.   Wt Readings from Last 3 Encounters:  02/21/20 206 lb (93.4 kg)  02/15/20 203 lb 3.2 oz (92.2 kg)  01/11/20 203 lb 6.4 oz (92.3 kg)    Ocular: Sclerae unicteric, pupils equal, round and reactive to light Ear-nose-throat: Oropharynx clear, dentition fair Lymphatic: No cervical or supraclavicular adenopathy Lungs no rales or rhonchi, good excursion bilaterally Heart regular rate and rhythm, no murmur appreciated Abd soft, nontender, positive bowel sounds MSK no focal spinal tenderness, no joint edema Neuro: non-focal, well-oriented, appropriate affect Breasts: Deferred   Lab Results  Component Value Date   WBC 5.0 04/03/2020   HGB 10.7 (L) 04/03/2020   HCT 33.3 (L) 04/03/2020   MCV 100.0 04/03/2020   PLT 142 (L) 04/03/2020   Lab Results  Component Value Date   FERRITIN 205 02/21/2020   IRON 109 02/21/2020   TIBC 284 02/21/2020   UIBC 175 02/21/2020   IRONPCTSAT 38 02/21/2020   Lab Results  Component Value Date   RETICCTPCT 0.8 04/03/2020   RBC 3.33 (L) 04/03/2020   RBC 3.33 (L) 04/03/2020   RETICCTABS 51.6 01/03/2013   Lab Results  Component Value Date   KPAFRELGTCHN 25.9 (H) 01/11/2020   LAMBDASER 37.5 (H) 01/11/2020   KAPLAMBRATIO 0.69 01/11/2020   Lab Results  Component Value Date   IGGSERUM 962 01/11/2020   IGA 583 (H) 01/11/2020   IGMSERUM 25 (L) 01/11/2020   Lab Results  Component Value Date   TOTALPROTELP 7.0 01/11/2020   ALBUMINELP 3.9 01/11/2020   A1GS 0.2 01/11/2020   A2GS 0.7 01/11/2020   BETS 1.3 01/11/2020   BETA2SER 10.3 (H) 11/04/2011   GAMS 0.9 01/11/2020   MSPIKE 0.3 (H) 01/11/2020   SPEI Comment 01/11/2020     Chemistry      Component Value Date/Time   NA 141 04/03/2020 1322   NA 150 (H) 03/01/2017 1308   NA 143 04/05/2016 1156   K 4.9 04/03/2020 1322   K 4.0 03/01/2017 1308   K 3.8 04/05/2016 1156   CL 108 04/03/2020 1322   CL 109 (H) 03/01/2017  1308   CO2 26 04/03/2020 1322   CO2 28 03/01/2017 1308   CO2 21 (L) 04/05/2016 1156   BUN 35 (H) 04/03/2020 1322   BUN 19 03/01/2017 1308   BUN 18.5 04/05/2016 1156   CREATININE 2.21 (H) 04/03/2020 1322   CREATININE 1.8 (H) 03/01/2017 1308   CREATININE 1.5 (H) 04/05/2016 1156      Component Value Date/Time   CALCIUM 9.5 04/03/2020 1322   CALCIUM 9.7 03/01/2017 1308   CALCIUM 9.3 04/05/2016 1156   ALKPHOS 65 04/03/2020 1322   ALKPHOS 55 03/01/2017 1308   ALKPHOS 77 04/05/2016 1156   AST 20 04/03/2020 1322   AST 21 04/05/2016 1156   ALT 22 04/03/2020 1322   ALT 18 03/01/2017 1308  ALT 22 04/05/2016 1156   BILITOT 0.3 04/03/2020 1322   BILITOT 0.36 04/05/2016 1156       Impression and Plan: Tina Molina is a very pleasant 82yoAfrican American female with multifactorial anemia as well as an IgA lambda MGUS. Protein studies in August were stable.  Aranesp given today for Hgb 10.7.  Iron studies are pending and we will replace if needed.  Follow-up in 6 weeks.  She will contact our office with any questions or concerns.   Laverna Peace, NP 11/18/20212:01 PM

## 2020-04-03 NOTE — Patient Instructions (Signed)
Darbepoetin Alfa injection What is this medicine? DARBEPOETIN ALFA (dar be POE e tin AL fa) helps your body make more red blood cells. It is used to treat anemia caused by chronic kidney failure and chemotherapy. This medicine may be used for other purposes; ask your health care provider or pharmacist if you have questions. COMMON BRAND NAME(S): Aranesp What should I tell my health care provider before I take this medicine? They need to know if you have any of these conditions:  blood clotting disorders or history of blood clots  cancer patient not on chemotherapy  cystic fibrosis  heart disease, such as angina, heart failure, or a history of a heart attack  hemoglobin level of 12 g/dL or greater  high blood pressure  low levels of folate, iron, or vitamin B12  seizures  an unusual or allergic reaction to darbepoetin, erythropoietin, albumin, hamster proteins, latex, other medicines, foods, dyes, or preservatives  pregnant or trying to get pregnant  breast-feeding How should I use this medicine? This medicine is for injection into a vein or under the skin. It is usually given by a health care professional in a hospital or clinic setting. If you get this medicine at home, you will be taught how to prepare and give this medicine. Use exactly as directed. Take your medicine at regular intervals. Do not take your medicine more often than directed. It is important that you put your used needles and syringes in a special sharps container. Do not put them in a trash can. If you do not have a sharps container, call your pharmacist or healthcare provider to get one. A special MedGuide will be given to you by the pharmacist with each prescription and refill. Be sure to read this information carefully each time. Talk to your pediatrician regarding the use of this medicine in children. While this medicine may be used in children as young as 1 month of age for selected conditions, precautions do  apply. Overdosage: If you think you have taken too much of this medicine contact a poison control center or emergency room at once. NOTE: This medicine is only for you. Do not share this medicine with others. What if I miss a dose? If you miss a dose, take it as soon as you can. If it is almost time for your next dose, take only that dose. Do not take double or extra doses. What may interact with this medicine? Do not take this medicine with any of the following medications:  epoetin alfa This list may not describe all possible interactions. Give your health care provider a list of all the medicines, herbs, non-prescription drugs, or dietary supplements you use. Also tell them if you smoke, drink alcohol, or use illegal drugs. Some items may interact with your medicine. What should I watch for while using this medicine? Your condition will be monitored carefully while you are receiving this medicine. You may need blood work done while you are taking this medicine. This medicine may cause a decrease in vitamin B6. You should make sure that you get enough vitamin B6 while you are taking this medicine. Discuss the foods you eat and the vitamins you take with your health care professional. What side effects may I notice from receiving this medicine? Side effects that you should report to your doctor or health care professional as soon as possible:  allergic reactions like skin rash, itching or hives, swelling of the face, lips, or tongue  breathing problems  changes in   vision  chest pain  confusion, trouble speaking or understanding  feeling faint or lightheaded, falls  high blood pressure  muscle aches or pains  pain, swelling, warmth in the leg  rapid weight gain  severe headaches  sudden numbness or weakness of the face, arm or leg  trouble walking, dizziness, loss of balance or coordination  seizures (convulsions)  swelling of the ankles, feet, hands  unusually weak or  tired Side effects that usually do not require medical attention (report to your doctor or health care professional if they continue or are bothersome):  diarrhea  fever, chills (flu-like symptoms)  headaches  nausea, vomiting  redness, stinging, or swelling at site where injected This list may not describe all possible side effects. Call your doctor for medical advice about side effects. You may report side effects to FDA at 1-800-FDA-1088. Where should I keep my medicine? Keep out of the reach of children. Store in a refrigerator between 2 and 8 degrees C (36 and 46 degrees F). Do not freeze. Do not shake. Throw away any unused portion if using a single-dose vial. Throw away any unused medicine after the expiration date. NOTE: This sheet is a summary. It may not cover all possible information. If you have questions about this medicine, talk to your doctor, pharmacist, or health care provider.  2020 Elsevier/Gold Standard (2017-05-18 16:44:20)  

## 2020-04-03 NOTE — Progress Notes (Signed)
Pt discharged in no apparent distress. Pt left ambulatory without assistance. Pt aware of discharge instructions and verbalized understanding and had no further questions.  

## 2020-04-04 ENCOUNTER — Telehealth: Payer: Self-pay

## 2020-04-04 LAB — IRON AND TIBC
Iron: 140 ug/dL (ref 41–142)
Saturation Ratios: 50 % (ref 21–57)
TIBC: 278 ug/dL (ref 236–444)
UIBC: 138 ug/dL (ref 120–384)

## 2020-04-04 LAB — FERRITIN: Ferritin: 222 ng/mL (ref 11–307)

## 2020-04-04 NOTE — Telephone Encounter (Signed)
Called and left a message with follow up appt per 11/18\21 los

## 2020-04-08 NOTE — Telephone Encounter (Signed)
   Patient states she no longer uses [facility]. Please send medication to [facility] on file She has no famotidine (PEPCID) 40 MG tablet remaining

## 2020-04-09 ENCOUNTER — Other Ambulatory Visit: Payer: Self-pay

## 2020-04-09 MED ORDER — FAMOTIDINE 40 MG PO TABS
ORAL_TABLET | ORAL | 1 refills | Status: DC
Start: 2020-04-09 — End: 2020-05-01

## 2020-04-09 NOTE — Telephone Encounter (Signed)
Script sent in today. 

## 2020-04-21 ENCOUNTER — Other Ambulatory Visit: Payer: Self-pay | Admitting: Internal Medicine

## 2020-04-21 DIAGNOSIS — I10 Essential (primary) hypertension: Secondary | ICD-10-CM

## 2020-04-21 MED ORDER — GABAPENTIN 300 MG PO CAPS
ORAL_CAPSULE | ORAL | 1 refills | Status: DC
Start: 2020-04-21 — End: 2020-05-01

## 2020-04-21 MED ORDER — HYDRALAZINE HCL 50 MG PO TABS: 50.0000 mg | ORAL_TABLET | Freq: Three times a day (TID) | ORAL | 0 refills | Status: DC

## 2020-04-28 ENCOUNTER — Telehealth: Payer: Self-pay | Admitting: Pharmacist

## 2020-04-28 NOTE — Progress Notes (Signed)
Chronic Care Management Pharmacy Assistant   Name: Tina Molina  MRN: [id number] DOB: [birth year]  Reason for Encounter: Initial Questions   PCP : Binnie Rail, MD  Allergies:   Allergies  Allergen Reactions  . Penicillins Other (See Comments)    Intolerance or allergy not remembered; her sister stated penicillin caused "boils under the skin"  . Shellfish Allergy Rash  . Shellfish-Derived Products Anaphylaxis  . Codeine Nausea Only    Nausea only with liquid codeine  . Infliximab Other (See Comments)     Confusion (intolerance)  . Sulfonamide Derivatives Rash  . Azithromycin Itching    Medications: Outpatient Encounter Medications as of 04/28/2020  Medication Sig Note  . aspirin 81 MG tablet Take 81 mg by mouth daily.    Marland Kitchen atorvastatin (LIPITOR) 20 MG tablet TAKE 1 TABLET(20 MG) BY MOUTH DAILY   . bimatoprost (LUMIGAN) 0.03 % ophthalmic solution 1 drop at bedtime.   . carvedilol (COREG) 25 MG tablet TAKE 1 TABLET(25 MG) BY MOUTH TWICE DAILY   . cyclobenzaprine (FLEXERIL) 10 MG tablet Take 10 mg by mouth 3 (three) times daily as needed for muscle spasms.   . famotidine (PEPCID) 40 MG tablet TAKE 1 TABLET(40 MG) BY MOUTH TWICE DAILY   . furosemide (LASIX) 20 MG tablet Take 20 mg by mouth daily.   Marland Kitchen gabapentin (NEURONTIN) 300 MG capsule TAKE 1 CAPSULE(300 MG) BY MOUTH DAILY   . hydrALAZINE (APRESOLINE) 50 MG tablet Take 1 tablet (50 mg total) by mouth 3 (three) times daily.   Marland Kitchen leflunomide (ARAVA) 20 MG tablet Take 20 mg by mouth daily.   Marland Kitchen levothyroxine (SYNTHROID) 200 MCG tablet Take 1 tablet (200 mcg total) by mouth daily before breakfast.   . LUMIGAN 0.01 % SOLN INSTILL 1 DROP INTO BOTH EYES ONCE DAILY AT BEDTIME   . MELATONIN GUMMIES PO Take by mouth at bedtime as needed.   . Multiple Vitamin (MULTIVITAMIN WITH MINERALS) TABS Take 1 tablet by mouth daily.   Marland Kitchen spironolactone (ALDACTONE) 50 MG tablet TAKE 1/2 TABLET BY MOUTH ONCE DAILY   . SYMBICORT 160-4.5 MCG/ACT  inhaler inhale 2 puffs if needed for wheezing   . telmisartan (MICARDIS) 40 MG tablet Take 40 mg by mouth daily. Taking half a tablet 08/10/2019: Patient stated she is taking half a tablet (20 mg).  Marland Kitchen telmisartan (MICARDIS) 80 MG tablet TAKE 1 TABLET(80 MG) BY MOUTH DAILY   . vitamin E 400 UNIT capsule Take 400 Units by mouth daily.    . [DISCONTINUED] modafinil (PROVIGIL) 100 MG tablet Take 100 mg by mouth daily.   . [DISCONTINUED] potassium chloride (MICRO-K) 10 MEQ CR capsule Take 1 capsule (10 mEq total) by mouth 2 (two) times daily.    No facility-administered encounter medications on file as of 04/28/2020.    Current Diagnosis: Patient Active Problem List   Diagnosis Date Noted  . Nocturia more than twice per night 10/25/2017  . Insomnia secondary to chronic pain 10/25/2017  . Upper back pain on left side 04/20/2017  . IDA (iron deficiency anemia) 04/06/2016  . Erythropoietin deficiency anemia 02/23/2016  . GERD (gastroesophageal reflux disease) 06/25/2015  . Hyperlipidemia 01/13/2015  . Hyperglycemia 01/13/2015  . Rheumatoid factor positive 02/20/2014  . Thoracic radiculopathy 10/30/2013  . Circadian rhythm sleep disorder, shift work type 11/03/2012  . Hematuria 08/05/2012  . MGUS (monoclonal gammopathy of unknown significance) 08/03/2012  . Lumbar stenosis with neurogenic claudication 02/29/2012  . Edema 09/13/2011  . Chronic kidney disease,  stage IV (severe) ([place]) 12/19/2009  . HERNIATED LUMBAR DISC 12/19/2009  . PULMONARY SARCOIDOSIS 10/16/2009  . Hypothyroidism 10/16/2009  . Anemia associated with stage 2 chronic renal failure 10/16/2009  . CARPAL TUNNEL SYNDROME, BILATERAL 10/16/2009  . UVEITIS 10/16/2009  . Essential hypertension 10/16/2009  . ARTHRITIS 10/16/2009  . CERVICAL DISC DISORDER 10/16/2009    Goals Addressed   None     Follow-Up:  Pharmacist Review   Have you seen any other providers since your last visit? The patient states that she last seen a  Dr. Michaela Corner on 04/03/20  Any changes in your medications or health? The patient states that for her Levothyroxine she is taking 1 tablet for six days and on the seventh day she takes 1/2 tab   Any side effects from any medications? The patient is not having any side affects from medication  Do you have an symptoms or problems not managed by your medications? The patient states she just have the normal aches and pains  Any concerns about your health right now? The patient does not have any concerns about health at this time  Has your provider asked that you check blood pressure, blood sugar, or follow special diet at home? The patient does not check blood pressure at home and is not following any special diet.  Do you get any type of exercise on a regular basis? The patient states that she is active around the house  Can you think of a goal you would like to reach for your health? The patient states that she would like to be pain free and to stay as healthy as she can  Do you have any problems getting your medications? The patient has no problems with getting her medication from the pharmacy  Is there anything that you would like to discuss during the appointment? The patient states that she would like to discuss the cost of her medication Lumigan and the Symbicort.   Please bring medications and supplements to appointment   Wendy Poet, Clinical Pharmacist Assistant [facility]

## 2020-04-29 ENCOUNTER — Other Ambulatory Visit: Payer: Self-pay

## 2020-04-29 ENCOUNTER — Ambulatory Visit: Payer: Medicare Other | Admitting: Pharmacist

## 2020-04-29 DIAGNOSIS — M549 Dorsalgia, unspecified: Secondary | ICD-10-CM

## 2020-04-29 DIAGNOSIS — I10 Essential (primary) hypertension: Secondary | ICD-10-CM

## 2020-04-29 DIAGNOSIS — E7849 Other hyperlipidemia: Secondary | ICD-10-CM

## 2020-04-29 DIAGNOSIS — E890 Postprocedural hypothyroidism: Secondary | ICD-10-CM

## 2020-04-29 NOTE — Progress Notes (Signed)
Chronic Care Management Pharmacy  Name: SHAMIKA PEDREGON  MRN: [id number] DOB: Aug 28, 1945   Chief Complaint/ HPI  Gailen Shelter,  74 y.o. , female presents for her Initial CCM visit with the clinical pharmacist via telephone due to COVID-19 Pandemic.  PCP : Binnie Rail, MD Patient Care Team: Binnie Rail, MD as PCP - General (Internal Medicine) Charlton Haws, [facility] as Pharmacist (Pharmacist)  Patient's chronic conditions include: Hypertension, Hyperlipidemia, GERD, Chronic Kidney Disease, Hypothyroidism and Osteoarthritis, Anemia, chronic pain, pulmonary sarcoidosis  Patient lives alone. She recently transferred her medications from [facility] to [facility] so she could use [facility] for leflunomide and Lumigan.   Office Visits: 02/15/20 Dr Quay Burow OV: chronic F/U. TSH elevated, increased levothyroxine to 200 mcg daily.   Consult Visit: 04/03/20 NP Laverna Peace (heme/onc): f/u anemia. Aranesp injection  01/07/20 Dr Arty Baumgartner (nephrology): f/u for CKD stage IV.  12/03/19 Dr Peter Garter (optometry): f/u for ocular HTN.  Allergies  Allergen Reactions  . Penicillins Other (See Comments)    Intolerance or allergy not remembered; her sister stated penicillin caused "boils under the skin"  . Shellfish Allergy Rash  . Shellfish-Derived Products Anaphylaxis  . Codeine Nausea Only    Nausea only with liquid codeine  . Infliximab Other (See Comments)     Confusion (intolerance)  . Sulfonamide Derivatives Rash  . Azithromycin Itching   Medications: Outpatient Encounter Medications as of 04/29/2020  Medication Sig Note  . aspirin 81 MG tablet Take 81 mg by mouth daily.   Marland Kitchen atorvastatin (LIPITOR) 20 MG tablet TAKE 1 TABLET(20 MG) BY MOUTH DAILY   . bimatoprost (LUMIGAN) 0.03 % ophthalmic solution 1 drop at bedtime.   . carvedilol (COREG) 25 MG tablet TAKE 1 TABLET(25 MG) BY MOUTH TWICE DAILY   . cyclobenzaprine (FLEXERIL) 10 MG tablet Take 10 mg by mouth 3 (three) times daily  as needed for muscle spasms.   . famotidine (PEPCID) 40 MG tablet TAKE 1 TABLET(40 MG) BY MOUTH TWICE DAILY   . furosemide (LASIX) 20 MG tablet Take 20 mg by mouth daily.   Marland Kitchen gabapentin (NEURONTIN) 300 MG capsule TAKE 1 CAPSULE(300 MG) BY MOUTH DAILY   . hydrALAZINE (APRESOLINE) 50 MG tablet Take 1 tablet (50 mg total) by mouth 3 (three) times daily.   Marland Kitchen leflunomide (ARAVA) 20 MG tablet Take 20 mg by mouth daily.   Marland Kitchen levothyroxine (SYNTHROID) 200 MCG tablet Take 1 tablet (200 mcg total) by mouth daily before breakfast.   . MELATONIN GUMMIES PO Take by mouth at bedtime as needed.   . Multiple Vitamin (MULTIVITAMIN WITH MINERALS) TABS Take 1 tablet by mouth daily.   Marland Kitchen spironolactone (ALDACTONE) 50 MG tablet TAKE 1/2 TABLET BY MOUTH ONCE DAILY   . SYMBICORT 160-4.5 MCG/ACT inhaler inhale 2 puffs if needed for wheezing   . telmisartan (MICARDIS) 80 MG tablet TAKE 1 TABLET(80 MG) BY MOUTH DAILY (Patient taking differently: Take 40 mg by mouth daily. TAKE 1/2 TABLET (40 MG) BY MOUTH DAILY)   . vitamin E 400 UNIT capsule Take 400 Units by mouth daily.    . [DISCONTINUED] LUMIGAN 0.01 % SOLN INSTILL 1 DROP INTO BOTH EYES ONCE DAILY AT BEDTIME   . [DISCONTINUED] telmisartan (MICARDIS) 40 MG tablet Take 40 mg by mouth daily. Taking half a tablet 08/10/2019: Patient stated she is taking half a tablet (20 mg).  . [DISCONTINUED] modafinil (PROVIGIL) 100 MG tablet Take 100 mg by mouth daily.   . [DISCONTINUED] potassium chloride (MICRO-K)  10 MEQ CR capsule Take 1 capsule (10 mEq total) by mouth 2 (two) times daily.    No facility-administered encounter medications on file as of 04/29/2020.    Wt Readings from Last 3 Encounters:  04/03/20 (P) 203 lb 1.9 oz (92.1 kg)  02/21/20 206 lb (93.4 kg)  02/15/20 203 lb 3.2 oz (92.2 kg)   Lab Results  Component Value Date   CREATININE 2.21 (H) 04/03/2020   BUN 35 (H) 04/03/2020   GFR 17.00 (L) 02/12/2019   GFRNONAA 23 (L) 04/03/2020   GFRAA 29 (L) 01/11/2020    NA 141 04/03/2020   K 4.9 04/03/2020   CALCIUM 9.5 04/03/2020   CO2 26 04/03/2020    Current Diagnosis/Assessment:  SDOH Interventions   Flowsheet Row Most Recent Value  SDOH Interventions   Financial Strain Interventions Other (Comment)  [pt was using non-preferred pharmacy, switching to preferred]      Goals Addressed            This Visit's Progress   . Pharmacy Care Plan       CARE PLAN ENTRY (see longitudinal plan of care for additional care plan information)  Current Barriers:  . Chronic Disease Management support, education, and care coordination needs related to Hypertension, Hyperlipidemia, Hypothyroidism, and chronic pain   Hypertension BP Readings from Last 3 Encounters:  04/03/20 (!) (P) 146/79  02/21/20 127/62  02/15/20 138/70 .  Pharmacist Clinical Goal(s): o Over the next 90 days, patient will work with PharmD and providers to maintain BP goal <130/80 . Current regimen:  o Carvedilol 25 mg twice a day o Hydralazine 50 mg 3 times a day o Spironolactone 50 mg - 1/2 tab daily o Telmisartan 80 mg daily - 1/2 tab daily o Furosemide 20 mg daily . Interventions: o Discussed BP goals and benefits of medications for prevention of heart attack / stroke o Recommend to change telmisartan Rx to 40 mg and spironolactone Rx to 25 mg to avoid splitting pills . Patient self care activities - Over the next 90 days, patient will: o Check BP daily, document, and provide at future appointments o Ensure daily salt intake < 2300 mg/day  Hyperlipidemia Lab Results  Component Value Date/Time   LDLCALC 88 08/14/2019 01:47 PM   LDLDIRECT 156.5 05/27/2011 04:19 PM .  Pharmacist Clinical Goal(s): o Over the next 90 days, patient will work with PharmD and providers to maintain LDL goal < 100 . Current regimen:  o Atorvastatin 20 mg daily . Interventions: o Discussed cholesterol goals and benefits of medications for prevention of heart attack / stroke . Patient self care  activities - Over the next 90 days, patient will: o Continue current medications  Chronic pain . Pharmacist Clinical Goal(s) o Over the next 90 days, patient will work with PharmD and providers to optimize therapy . Current regimen:  o Leflunomide 20 mg daily  o Gabapentin 300 mg daily (taking 2-3 times daily) o Cyclobenzaprine 10 mg 3 times daily as needed . Interventions: o Discussed gabapentin can cause oversedation; advised to reduce dose to once daily as prescribed . Patient self care activities - Over the next 90 days, patient will: o Reduce gabapentin to once daily  Hypothyroidism . Pharmacist Clinical Goal(s) o Over the next 90 days, patient will work with PharmD and providers to optimize therapy . Current regimen:  o Levothyroxine 200 mcg daily (taking extra 1/2 dose once a week) . Interventions: o Discussed extra 1/2 is unnecessary since PCP increased dosage; advised  to just take one tablet daily . Patient self care activities - Over the next 90 days, patient will: o Take levothyroxine once daily as prescribed  Medication management . Pharmacist Clinical Goal(s): o Over the next 90 days, patient will work with PharmD and providers to achieve optimal medication adherence . [facility] . Interventions o Comprehensive medication review performed. o Utilize [facility] for medication synchronization, packaging and delivery . Patient self care activities - Over the next 90 days, patient will: o Focus on medication adherence by fill date o Take medications as prescribed o Report any questions or concerns to PharmD and/or provider(s)  Initial goal documentation       Hypertension   BP goal is:  <130/80  Office blood pressures are  BP Readings from Last 3 Encounters:  04/03/20 (!) (P) 146/79  02/21/20 127/62  02/15/20 138/70   Patient checks BP at home 3-5x per week Patient home BP readings are ranging: 133/69 today; range  120s-140s  Patient has failed these meds in the past: n/a Patient is currently controlled on the following medications:  . Carvedilol 25 mg BID . Hydralazine 50 mg TID . Spironolactone 50 mg - 1/2 tab daily . Telmisartan 80 mg daily - 1/2 tab daily . Furosemide 20 mg daily   We discussed diet and exercise extensively; BP goals; pt is taking 1/2 of prescribed dose of telmisartan, however BP is relatively controlled at home; given poor renal function, will not increase telmisartan to 80 mg -consider switching telmisartan rx to 40 mg and spironolactone rx to 25 mg to avoid splitting tablets (spiro 25 mg is also cheaper with insurance)  Plan  Continue current medications  Recommend changing telmisartan rx to 40 mg and spironolactone rx to 25 mg to avoid splitting tablets  Hyperlipidemia   LDL goal < 100  Last lipids Lab Results  Component Value Date   CHOL 167 08/14/2019   HDL 55.70 08/14/2019   LDLCALC 88 08/14/2019   LDLDIRECT 156.5 05/27/2011   TRIG 116.0 08/14/2019   CHOLHDL 3 08/14/2019   Hepatic Function Latest Ref Rng & Units 04/03/2020 02/21/2020 01/11/2020  Total Protein 6.5 - 8.1 g/dL 7.5 7.1 7.4  Albumin 3.5 - 5.0 g/dL 4.3 4.4 4.3  AST 15 - 41 U/L $Remo'20 16 16  'GNgfu$ ALT 0 - 44 U/L $Remo'22 11 15  'YlqwR$ Alk Phosphatase 38 - 126 U/L 65 61 62  Total Bilirubin 0.3 - 1.2 mg/dL 0.3 0.4 0.4  Bilirubin, Direct 0.0 - 0.3 mg/dL - - -    The 10-year ASCVD risk score Mikey Bussing DC Jr., et al., 2013) is: 15.2%   Values used to calculate the score:     Age: 2 years     Sex: Female     Is Non-Hispanic African American: Yes     Diabetic: No     Tobacco smoker: No     Systolic Blood Pressure: 591 mmHg     Is BP treated: Yes     HDL Cholesterol: 55.7 mg/dL     Total Cholesterol: 167 mg/dL   Patient has failed these meds in past: n/a Patient is currently controlled on the following medications:  . Atorvastatin 20 mg daily . Aspirin 81 mg daily  We discussed:  diet and exercise extensively  ;Cholesterol goals; benefits of statin for ASCVD risk reduction  Plan  Continue current medications and control with diet and exercise  Hypothyroidism   Lab Results  Component Value Date/Time   TSH 8.56 (H)  02/15/2020 01:36 PM   TSH 4.64 (H) 10/17/2019 01:54 PM   Patient has failed these meds in past: levothyroxine 175 mcg Patient is currently controlled on the following medications:  . Levothyroxine 200 mcg - 1 tab daily except 1.5 tab once a week   We discussed:  Pt was taking an extra 1/2 tab once a week because that is how she took her previous dose; discussed why dose was increased from 175 to 200 mcg; advised pt to take one tablet daily as prescribed; pt does not endorse s/sx of hypo- or hyperthyroidism, ok to wait for PCP visit in April 2022 to recheck TSH  Plan  Continue current medications  GERD   Patient has failed these meds in past: n/a Patient is currently controlled on the following medications:  . Famotidine 40 mg BID  We discussed: Indication and benefits of famotidine; Patient is satisfied with current regimen and denies issues  Plan  Continue current medications  Pulmonary sarcoidosis   Patient has failed these meds in past: n/a Patient is currently uncontrolled on the following medications:  Marland Kitchen Symbicort 160-4.5 mg 2 puffs PRN  We discussed:  Pt has not been able to pick up Symbicort due to high cost; of note she has been using non-preferred pharmacy ([place] [facility]); Symbicort is Tier 3 with insurance, ~$35 per month after deductible     Plan  Continue current medications  Chronic pain   Rheumatoid factor positive Arthritis Thoracic radiculopathy  Patient has failed these meds in past: n/a Patient is currently controlled on the following medications:  . Leflunomide 20 mg daily (Dr Amil Amen) - $30 w/ [facility] . Gabapentin 300 mg daily . Cyclobenzaprine 10 mg TID prn  We discussed:  Pt has been taking gabapentin BID-TID; pt endorses  fatigue/sleepiness; advised to reduce gabapentin to daily as prescribed  Plan  Recommend to reduce gabapentin to once daily as prescribed  Anemia   Follows w/ hematology: Anemia of CKD MGUS- IgA lambda Iron-deficiency anemia  CBC Latest Ref Rng & Units 04/03/2020 02/21/2020 01/11/2020  WBC 4.0 - 10.5 K/uL 5.0 4.0 4.0  Hemoglobin 12.0 - 15.0 g/dL 10.7(L) 10.3(L) 10.4(L)  Hematocrit 36.0 - 46.0 % 33.3(L) 32.3(L) 32.1(L)  Platelets 150 - 400 K/uL 142(L) 145(L) 149(L)   Iron/TIBC/Ferritin/ %Sat    Component Value Date/Time   IRON 140 04/03/2020 1322   IRON 82 03/01/2017 1308   TIBC 278 04/03/2020 1322   TIBC 280 03/01/2017 1308   FERRITIN 222 04/03/2020 1322   FERRITIN 191 03/01/2017 1308   IRONPCTSAT 50 04/03/2020 1322   IRONPCTSAT 29 03/01/2017 1308   IRONPCTSAT 28 08/03/2012 1454   Patient has failed these meds in past: n/a Patient is currently controlled on the following medications:  . Aranesp 300 mcg injection . IV iron as indicated  We discussed:  Pt repots Aranesp is covered 100% because prescriber appealed to insurance; Patient is satisfied with current regimen and denies issues  Plan  Continue current medications  Insomnia   Patient has failed these meds in past: n/a Patient is currently controlled on the following medications:  Marland Kitchen Melatonin  We discussed:  Patient is satisfied with current regimen and denies issues  Plan  Continue current medications  Glaucoma   Patient has failed these meds in past: n/a Patient is currently controlled on the following medications:  . Lumigan (bimatoprost) eye drops - [facility]  We discussed:  Pt reports eye drops were expensive so [facility]; per formulary Lumigan brand is preferred Tier 3 ($35/month);   Plan  Continue current medications  Vaccines   Reviewed and discussed patient's vaccination history.    Immunization History  Administered Date(s) Administered  . Fluad  Quad(high Dose 65+) 02/12/2019, 02/15/2020  . Influenza Whole 01/20/2010  . Influenza, High Dose Seasonal PF 03/01/2013, 03/13/2015, 04/20/2017, 03/30/2018  . Influenza,inj,Quad PF,6+ Mos 04/05/2016  . PFIZER SARS-COV-2 Vaccination 07/26/2019, 08/16/2019  . Pneumococcal Conjugate-13 01/13/2015  . Pneumococcal Polysaccharide-23 05/30/2013  . Td 06/01/2009   Plan   Recommended patient receive Shingrix vaccine   Health Maintenance   Patient is currently controlled on the following medications:  Marland Kitchen Multivitamin  We discussed:  Patient is satisfied with current regimen and denies issues  Plan  Continue current medications  Medication Management   Patient's preferred pharmacy is:  [facility] [address], [place] [address] Phone: [phone] Fax: [phone]  Uses pill box? No - prefers bottles Pt endorses 95% compliance  We discussed: Verbal consent obtained for [facility] enhanced pharmacy services (medication synchronization, adherence packaging, delivery coordination). A medication sync plan was created to allow patient to get all medications delivered once every 30 to 90 days per patient preference. Patient understands they have freedom to choose pharmacy and clinical pharmacist will coordinate care between all prescribers and [facility].  Pt has not been using preferred pharmacies - [facility] preferred by [place]. Generic medications will be $0-5 and brand name will be $35 per month. Pt reports this is reasonable   Plan  Utilize [facility] pharmacy for medication synchronization, packaging and delivery    Follow up: 3 month phone visit  Charlene Brooke, PharmD, [facility] Clinical Pharmacist [facility] at [facility] [phone]

## 2020-04-30 ENCOUNTER — Telehealth: Payer: Self-pay | Admitting: Pharmacist

## 2020-04-30 DIAGNOSIS — Z6835 Body mass index (BMI) 35.0-35.9, adult: Secondary | ICD-10-CM | POA: Diagnosis not present

## 2020-04-30 DIAGNOSIS — M5415 Radiculopathy, thoracolumbar region: Secondary | ICD-10-CM | POA: Diagnosis not present

## 2020-04-30 NOTE — Progress Notes (Signed)
Chronic Care Management Pharmacy Assistant   Name: Tina Molina  MRN: [id number] DOB: 1946-04-23  Reason for Encounter: Enrolling patient for Dispensing Services    PCP : Binnie Rail, MD  Allergies:   Allergies  Allergen Reactions  . Penicillins Other (See Comments)    Intolerance or allergy not remembered; her sister stated penicillin caused "boils under the skin"  . Shellfish Allergy Rash  . Shellfish-Derived Products Anaphylaxis  . Codeine Nausea Only    Nausea only with liquid codeine  . Infliximab Other (See Comments)     Confusion (intolerance)  . Sulfonamide Derivatives Rash  . Azithromycin Itching    Medications: Outpatient Encounter Medications as of 04/30/2020  Medication Sig Note  . aspirin 81 MG tablet Take 81 mg by mouth daily.    Marland Kitchen atorvastatin (LIPITOR) 20 MG tablet TAKE 1 TABLET(20 MG) BY MOUTH DAILY   . bimatoprost (LUMIGAN) 0.03 % ophthalmic solution 1 drop at bedtime.   . carvedilol (COREG) 25 MG tablet TAKE 1 TABLET(25 MG) BY MOUTH TWICE DAILY   . cyclobenzaprine (FLEXERIL) 10 MG tablet Take 10 mg by mouth 3 (three) times daily as needed for muscle spasms.   . famotidine (PEPCID) 40 MG tablet TAKE 1 TABLET(40 MG) BY MOUTH TWICE DAILY   . furosemide (LASIX) 20 MG tablet Take 20 mg by mouth daily.   Marland Kitchen gabapentin (NEURONTIN) 300 MG capsule TAKE 1 CAPSULE(300 MG) BY MOUTH DAILY   . hydrALAZINE (APRESOLINE) 50 MG tablet Take 1 tablet (50 mg total) by mouth 3 (three) times daily.   Marland Kitchen leflunomide (ARAVA) 20 MG tablet Take 20 mg by mouth daily.   Marland Kitchen levothyroxine (SYNTHROID) 200 MCG tablet Take 1 tablet (200 mcg total) by mouth daily before breakfast.   . LUMIGAN 0.01 % SOLN INSTILL 1 DROP INTO BOTH EYES ONCE DAILY AT BEDTIME   . MELATONIN GUMMIES PO Take by mouth at bedtime as needed.   . Multiple Vitamin (MULTIVITAMIN WITH MINERALS) TABS Take 1 tablet by mouth daily.   Marland Kitchen spironolactone (ALDACTONE) 50 MG tablet TAKE 1/2 TABLET BY MOUTH ONCE DAILY   .  SYMBICORT 160-4.5 MCG/ACT inhaler inhale 2 puffs if needed for wheezing   . telmisartan (MICARDIS) 40 MG tablet Take 40 mg by mouth daily. Taking half a tablet 08/10/2019: Patient stated she is taking half a tablet (20 mg).  Marland Kitchen telmisartan (MICARDIS) 80 MG tablet TAKE 1 TABLET(80 MG) BY MOUTH DAILY   . vitamin E 400 UNIT capsule Take 400 Units by mouth daily.    . [DISCONTINUED] modafinil (PROVIGIL) 100 MG tablet Take 100 mg by mouth daily.   . [DISCONTINUED] potassium chloride (MICRO-K) 10 MEQ CR capsule Take 1 capsule (10 mEq total) by mouth 2 (two) times daily.    No facility-administered encounter medications on file as of 04/30/2020.    Current Diagnosis: Patient Active Problem List   Diagnosis Date Noted  . Nocturia more than twice per night 10/25/2017  . Insomnia secondary to chronic pain 10/25/2017  . Upper back pain on left side 04/20/2017  . IDA (iron deficiency anemia) 04/06/2016  . Erythropoietin deficiency anemia 02/23/2016  . GERD (gastroesophageal reflux disease) 06/25/2015  . Hyperlipidemia 01/13/2015  . Hyperglycemia 01/13/2015  . Rheumatoid factor positive 02/20/2014  . Thoracic radiculopathy 10/30/2013  . Circadian rhythm sleep disorder, shift work type 11/03/2012  . Hematuria 08/05/2012  . MGUS (monoclonal gammopathy of unknown significance) 08/03/2012  . Lumbar stenosis with neurogenic claudication 02/29/2012  . Edema 09/13/2011  .  Chronic kidney disease, stage IV (severe) ([place]) 12/19/2009  . HERNIATED LUMBAR DISC 12/19/2009  . PULMONARY SARCOIDOSIS 10/16/2009  . Hypothyroidism 10/16/2009  . Anemia associated with stage 2 chronic renal failure 10/16/2009  . CARPAL TUNNEL SYNDROME, BILATERAL 10/16/2009  . UVEITIS 10/16/2009  . Essential hypertension 10/16/2009  . ARTHRITIS 10/16/2009  . CERVICAL DISC DISORDER 10/16/2009    Goals Addressed   None     Follow-Up:  Pharmacist Review   The patient had an initial televist with the clinical pharmacist Charlene Brooke on 04/29/20. Upon completetion of the visit the patient has agreed to try [facility] services for their dispensing and delivery of medications. Per clinical pharmacist request I completed an on-boarding form with the list of the patients medication, current pharmacy, demographics, allergies and insurance information. The form was then forwarded to the clinical pharmacist for review.   Wendy Poet, Clinical Pharmacist Assistant [facility]

## 2020-04-30 NOTE — Patient Instructions (Addendum)
Visit Information  Phone number for Pharmacist: [phone]  Thank you for meeting with me to discuss your medications! I look forward to working with you to achieve your health care goals. Below is a summary of what we talked about during the visit:  Goals Addressed            This Visit's Progress   . Pharmacy Care Plan       CARE PLAN ENTRY (see longitudinal plan of care for additional care plan information)  Current Barriers:  . Chronic Disease Management support, education, and care coordination needs related to Hypertension, Hyperlipidemia, Hypothyroidism, and chronic pain   Hypertension BP Readings from Last 3 Encounters:  04/03/20 (!) (P) 146/79  02/21/20 127/62  02/15/20 138/70 .  Pharmacist Clinical Goal(s): o Over the next 90 days, patient will work with PharmD and providers to maintain BP goal <130/80 . Current regimen:  o Carvedilol 25 mg twice a day o Hydralazine 50 mg 3 times a day o Spironolactone 50 mg - 1/2 tab daily o Telmisartan 80 mg daily - 1/2 tab daily o Furosemide 20 mg daily . Interventions: o Discussed BP goals and benefits of medications for prevention of heart attack / stroke o Recommend to change telmisartan Rx to 40 mg and spironolactone Rx to 25 mg to avoid splitting pills . Patient self care activities - Over the next 90 days, patient will: o Check BP daily, document, and provide at future appointments o Ensure daily salt intake < 2300 mg/day  Hyperlipidemia Lab Results  Component Value Date/Time   LDLCALC 88 08/14/2019 01:47 PM   LDLDIRECT 156.5 05/27/2011 04:19 PM .  Pharmacist Clinical Goal(s): o Over the next 90 days, patient will work with PharmD and providers to maintain LDL goal < 100 . Current regimen:  o Atorvastatin 20 mg daily . Interventions: o Discussed cholesterol goals and benefits of medications for prevention of heart attack / stroke . Patient self care activities - Over the next 90 days, patient will: o Continue  current medications  Chronic pain . Pharmacist Clinical Goal(s) o Over the next 90 days, patient will work with PharmD and providers to optimize therapy . Current regimen:  o Leflunomide 20 mg daily  o Gabapentin 300 mg daily (taking 2-3 times daily) o Cyclobenzaprine 10 mg 3 times daily as needed . Interventions: o Discussed gabapentin can cause oversedation; advised to reduce dose to once daily as prescribed . Patient self care activities - Over the next 90 days, patient will: o Reduce gabapentin to once daily  Hypothyroidism . Pharmacist Clinical Goal(s) o Over the next 90 days, patient will work with PharmD and providers to optimize therapy . Current regimen:  o Levothyroxine 200 mcg daily (taking extra 1/2 dose once a week) . Interventions: o Discussed extra 1/2 is unnecessary since PCP increased dosage; advised to just take one tablet daily . Patient self care activities - Over the next 90 days, patient will: o Take levothyroxine once daily as prescribed  Medication management . Pharmacist Clinical Goal(s): o Over the next 90 days, patient will work with PharmD and providers to achieve optimal medication adherence . [facility] . Interventions o Comprehensive medication review performed. o Utilize [facility] for medication synchronization, packaging and delivery . Patient self care activities - Over the next 90 days, patient will: o Focus on medication adherence by fill date o Take medications as prescribed o Report any questions or concerns to PharmD and/or provider(s)  Initial goal documentation  Tina Molina was given information about Chronic Care Management services today including:  1. CCM service includes personalized support from designated clinical staff supervised by her physician, including individualized plan of care and coordination with other care providers 2. 24/7 contact phone numbers for assistance for urgent and routine  care needs. 3. Standard insurance, coinsurance, copays and deductibles apply for chronic care management only during months in which we provide at least 20 minutes of these services. Most insurances cover these services at 100%, however patients may be responsible for any copay, coinsurance and/or deductible if applicable. This service may help you avoid the need for more expensive face-to-face services. 4. Only one practitioner may furnish and bill the service in a calendar month. 5. The patient may stop CCM services at any time (effective at the end of the month) by phone call to the office staff.  Patient agreed to services and verbal consent obtained.   The patient verbalized understanding of instructions, educational materials, and care plan provided today and agreed to receive a mailed copy of patient instructions, educational materials, and care plan.  Telephone follow up appointment with pharmacy team member scheduled for:  Charlene Brooke, PharmD, BCACP Clinical Pharmacist [facility] [phone]  Heart-Healthy Eating Plan Many factors influence your heart (coronary) health, including eating and exercise habits. Coronary risk increases with abnormal blood fat (lipid) levels. Heart-healthy meal planning includes limiting unhealthy fats, increasing healthy fats, and making other diet and lifestyle changes. What is my plan? Your health care provider may recommend that you:  Limit your fat intake to _________% or less of your total calories each day.  Limit your saturated fat intake to _________% or less of your total calories each day.  Limit the amount of cholesterol in your diet to less than _________ mg per day. What are tips for following this plan? Cooking Cook foods using methods other than frying. Baking, boiling, grilling, and broiling are all good options. Other ways to reduce fat include:  Removing the skin from poultry.  Removing all visible  fats from meats.  Steaming vegetables in water or broth. Meal planning   At meals, imagine dividing your plate into fourths: ? Fill one-half of your plate with vegetables and green salads. ? Fill one-fourth of your plate with whole grains. ? Fill one-fourth of your plate with lean protein foods.  Eat 4-5 servings of vegetables per day. One serving equals 1 cup raw or cooked vegetable, or 2 cups raw leafy greens.  Eat 4-5 servings of fruit per day. One serving equals 1 medium whole fruit,  cup dried fruit,  cup fresh, frozen, or canned fruit, or  cup 100% fruit juice.  Eat more foods that contain soluble fiber. Examples include apples, broccoli, carrots, beans, peas, and barley. Aim to get 25-30 g of fiber per day.  Increase your consumption of legumes, nuts, and seeds to 4-5 servings per week. One serving of dried beans or legumes equals  cup cooked, 1 serving of nuts is  cup, and 1 serving of seeds equals 1 tablespoon. Fats  Choose healthy fats more often. Choose monounsaturated and polyunsaturated fats, such as olive and canola oils, flaxseeds, walnuts, almonds, and seeds.  Eat more omega-3 fats. Choose salmon, mackerel, sardines, tuna, flaxseed oil, and ground flaxseeds. Aim to eat fish at least 2 times each week.  Check food labels carefully to identify foods with trans fats or high amounts of saturated fat.  Limit saturated fats. These are found  in animal products, such as meats, butter, and cream. Plant sources of saturated fats include palm oil, palm kernel oil, and coconut oil.  Avoid foods with partially hydrogenated oils in them. These contain trans fats. Examples are stick margarine, some tub margarines, cookies, crackers, and other baked goods.  Avoid fried foods. General information  Eat more home-cooked food and less restaurant, buffet, and fast food.  Limit or avoid alcohol.  Limit foods that are high in starch and sugar.  Lose weight if you are overweight.  Losing just 5-10% of your body weight can help your overall health and prevent diseases such as diabetes and heart disease.  Monitor your salt (sodium) intake, especially if you have high blood pressure. Talk with your health care provider about your sodium intake.  Try to incorporate more vegetarian meals weekly. What foods can I eat? Fruits All fresh, canned (in natural juice), or frozen fruits. Vegetables Fresh or frozen vegetables (raw, steamed, roasted, or grilled). Green salads. Grains Most grains. Choose whole wheat and whole grains most of the time. Rice and pasta, including brown rice and pastas made with whole wheat. Meats and other proteins Lean, well-trimmed beef, veal, pork, and lamb. Chicken and [place] without skin. All fish and shellfish. Wild duck, rabbit, pheasant, and venison. Egg whites or low-cholesterol egg substitutes. Dried beans, peas, lentils, and tofu. Seeds and most nuts. Dairy Low-fat or nonfat cheeses, including ricotta and mozzarella. Skim or 1% milk (liquid, powdered, or evaporated). Buttermilk made with low-fat milk. Nonfat or low-fat yogurt. Fats and oils Non-hydrogenated (trans-free) margarines. Vegetable oils, including soybean, sesame, sunflower, olive, peanut, safflower, corn, canola, and cottonseed. Salad dressings or mayonnaise made with a vegetable oil. Beverages Water (mineral or sparkling). Coffee and tea. Diet carbonated beverages. Sweets and desserts Sherbet, gelatin, and fruit ice. Small amounts of dark chocolate. Limit all sweets and desserts. Seasonings and condiments All seasonings and condiments. The items listed above may not be a complete list of foods and beverages you can eat. Contact a dietitian for more options. What foods are not recommended? Fruits Canned fruit in heavy syrup. Fruit in cream or butter sauce. Fried fruit. Limit coconut. Vegetables Vegetables cooked in cheese, cream, or butter sauce. Fried  vegetables. Grains Breads made with saturated or trans fats, oils, or whole milk. Croissants. Sweet rolls. Donuts. High-fat crackers, such as cheese crackers. Meats and other proteins Fatty meats, such as hot dogs, ribs, sausage, bacon, rib-eye roast or steak. High-fat deli meats, such as salami and bologna. Caviar. Domestic duck and goose. Organ meats, such as liver. Dairy Cream, sour cream, cream cheese, and creamed cottage cheese. Whole milk cheeses. Whole or 2% milk (liquid, evaporated, or condensed). Whole buttermilk. Cream sauce or high-fat cheese sauce. Whole-milk yogurt. Fats and oils Meat fat, or shortening. Cocoa butter, hydrogenated oils, palm oil, coconut oil, palm kernel oil. Solid fats and shortenings, including bacon fat, salt pork, lard, and butter. Nondairy cream substitutes. Salad dressings with cheese or sour cream. Beverages Regular sodas and any drinks with added sugar. Sweets and desserts Frosting. Pudding. Cookies. Cakes. Pies. Milk chocolate or white chocolate. Buttered syrups. Full-fat ice cream or ice cream drinks. The items listed above may not be a complete list of foods and beverages to avoid. Contact a dietitian for more information. Summary  Heart-healthy meal planning includes limiting unhealthy fats, increasing healthy fats, and making other diet and lifestyle changes.  Lose weight if you are overweight. Losing just 5-10% of your body weight can help your overall  health and prevent diseases such as diabetes and heart disease.  Focus on eating a balance of foods, including fruits and vegetables, low-fat or nonfat dairy, lean protein, nuts and legumes, whole grains, and heart-healthy oils and fats. This information is not intended to replace advice given to you by your health care provider. Make sure you discuss any questions you have with your health care provider. Document Revised: 06/10/2017 Document Reviewed: 06/10/2017 Elsevier Patient Education  2020  [employer].

## 2020-05-01 ENCOUNTER — Other Ambulatory Visit: Payer: Self-pay | Admitting: Internal Medicine

## 2020-05-01 ENCOUNTER — Other Ambulatory Visit: Payer: Self-pay

## 2020-05-01 DIAGNOSIS — I10 Essential (primary) hypertension: Secondary | ICD-10-CM

## 2020-05-01 MED ORDER — ATORVASTATIN CALCIUM 20 MG PO TABS
ORAL_TABLET | ORAL | 1 refills | Status: DC
Start: 2020-05-01 — End: 2020-09-08

## 2020-05-01 MED ORDER — LEVOTHYROXINE SODIUM 200 MCG PO TABS
200.0000 ug | ORAL_TABLET | Freq: Every day | ORAL | 1 refills | Status: DC
Start: 2020-05-01 — End: 2020-09-08

## 2020-05-01 MED ORDER — FUROSEMIDE 20 MG PO TABS: 20.0000 mg | ORAL_TABLET | Freq: Every day | ORAL | 1 refills | Status: DC

## 2020-05-01 MED ORDER — BUDESONIDE-FORMOTEROL FUMARATE 160-4.5 MCG/ACT IN AERO: INHALATION_SPRAY | RESPIRATORY_TRACT | 0 refills | Status: DC

## 2020-05-01 MED ORDER — HYDRALAZINE HCL 50 MG PO TABS: 50.0000 mg | ORAL_TABLET | Freq: Three times a day (TID) | ORAL | 0 refills | Status: DC

## 2020-05-01 MED ORDER — CARVEDILOL 25 MG PO TABS
ORAL_TABLET | ORAL | 1 refills | Status: DC
Start: 2020-05-01 — End: 2020-10-14

## 2020-05-01 MED ORDER — GABAPENTIN 300 MG PO CAPS: ORAL_CAPSULE | ORAL | 1 refills | Status: DC

## 2020-05-01 MED ORDER — FAMOTIDINE 40 MG PO TABS
ORAL_TABLET | ORAL | 1 refills | Status: DC
Start: 2020-05-01 — End: 2020-12-08

## 2020-05-02 ENCOUNTER — Other Ambulatory Visit: Payer: Self-pay

## 2020-05-02 ENCOUNTER — Other Ambulatory Visit: Payer: Self-pay | Admitting: Internal Medicine

## 2020-05-02 DIAGNOSIS — I10 Essential (primary) hypertension: Secondary | ICD-10-CM

## 2020-05-02 MED ORDER — TELMISARTAN 40 MG PO TABS: 40.0000 mg | ORAL_TABLET | Freq: Every day | ORAL | 1 refills | Status: DC

## 2020-05-02 MED ORDER — SPIRONOLACTONE 25 MG PO TABS: 25.0000 mg | ORAL_TABLET | Freq: Every day | ORAL | 1 refills | Status: DC

## 2020-05-02 NOTE — Telephone Encounter (Signed)
Received message from pharmacy regarding bimatoprost 0.03%.   Medication required a prior authorization. However per insurance formulary, Lumigan brand is actually the preferred product and will be $35/month. Per discussion with pharmacy Lumigan brand is only available in 0.01% concentration.  Contacted prescriber Dr. Marylouise Stacks office to see if bimatoprost concentration can be changed to 0.01% to allow pharmacy to fill preferred product. Awaiting response from prescriber.

## 2020-05-05 ENCOUNTER — Other Ambulatory Visit: Payer: Self-pay | Admitting: Internal Medicine

## 2020-05-13 ENCOUNTER — Ambulatory Visit (INDEPENDENT_AMBULATORY_CARE_PROVIDER_SITE_OTHER): Payer: Medicare Other

## 2020-05-13 DIAGNOSIS — Z0000 Encounter for general adult medical examination without abnormal findings: Secondary | ICD-10-CM | POA: Diagnosis not present

## 2020-05-13 NOTE — Progress Notes (Signed)
I connected with Tina Molina today by telephone and verified that I am speaking with the correct person using two identifiers. Location patient: home Location provider: work Persons participating in the virtual visit: Yanni Ruberg and Lisette Abu, LPN.   I discussed the limitations, risks, security and privacy concerns of performing an evaluation and management service by telephone and the availability of in person appointments. I also discussed with the patient that there may be a patient responsible charge related to this service. The patient expressed understanding and verbally consented to this telephonic visit.    Interactive audio and video telecommunications were attempted between this provider and patient, however failed, due to patient having technical difficulties OR patient did not have access to video capability.  We continued and completed visit with audio only.  Some vital signs may be absent or patient reported.   Time Spent with patient on telephone encounter: 30 minutes  Subjective:   Tina Molina is a 74 y.o. female who presents for Medicare Annual (Subsequent) preventive examination.  Review of Systems    No ROS. Medicare Wellness Visit. Additional risk factors are reflected in social history. Cardiac Risk Factors include: advanced age (>61men, >43 women);dyslipidemia;family history of premature cardiovascular disease;hypertension     Objective:    There were no vitals filed for this visit. There is no height or weight on file to calculate BMI.  Advanced Directives 05/13/2020 02/21/2020 01/11/2020 11/27/2019 10/16/2019 09/04/2019 08/10/2019  Does Patient Have a Medical Advance Directive? No No No No No No No  Does patient want to make changes to medical advance directive? - - - - - - -  Would patient like information on creating a medical advance directive? No - Patient declined No - Patient declined No - Patient declined No - Patient declined No - Patient  declined No - Patient declined No - Patient declined  Pre-existing out of facility DNR order (yellow form or pink MOST form) - - - - - - -    Current Medications (verified) Outpatient Encounter Medications as of 05/13/2020  Medication Sig  . aspirin 81 MG tablet Take 81 mg by mouth daily.  Marland Kitchen atorvastatin (LIPITOR) 20 MG tablet TAKE 1 TABLET(20 MG) BY MOUTH DAILY  . bimatoprost (LUMIGAN) 0.03 % ophthalmic solution 1 drop at bedtime.  . budesonide-formoterol (SYMBICORT) 160-4.5 MCG/ACT inhaler inhale 2 puffs if needed for wheezing  . carvedilol (COREG) 25 MG tablet TAKE 1 TABLET(25 MG) BY MOUTH TWICE DAILY  . cyclobenzaprine (FLEXERIL) 10 MG tablet Take 10 mg by mouth 3 (three) times daily as needed for muscle spasms.  . famotidine (PEPCID) 40 MG tablet TAKE 1 TABLET(40 MG) BY MOUTH TWICE DAILY  . furosemide (LASIX) 20 MG tablet Take 1 tablet (20 mg total) by mouth daily.  Marland Kitchen gabapentin (NEURONTIN) 300 MG capsule TAKE 1 CAPSULE(300 MG) BY MOUTH DAILY  . hydrALAZINE (APRESOLINE) 50 MG tablet Take 1 tablet (50 mg total) by mouth 3 (three) times daily.  Marland Kitchen leflunomide (ARAVA) 20 MG tablet Take 20 mg by mouth daily.  Marland Kitchen levothyroxine (SYNTHROID) 200 MCG tablet Take 1 tablet (200 mcg total) by mouth daily before breakfast.  . MELATONIN GUMMIES PO Take by mouth at bedtime as needed.  . Multiple Vitamin (MULTIVITAMIN WITH MINERALS) TABS Take 1 tablet by mouth daily.  Marland Kitchen spironolactone (ALDACTONE) 25 MG tablet Take 1 tablet (25 mg total) by mouth daily.  Marland Kitchen telmisartan (MICARDIS) 40 MG tablet Take 1 tablet (40 mg total) by mouth daily. (Patient taking  differently: Take 40 mg by mouth daily. Take 1/2 tablet daily)  . vitamin E 400 UNIT capsule Take 400 Units by mouth daily.   . [DISCONTINUED] modafinil (PROVIGIL) 100 MG tablet Take 100 mg by mouth daily.  . [DISCONTINUED] potassium chloride (MICRO-K) 10 MEQ CR capsule Take 1 capsule (10 mEq total) by mouth 2 (two) times daily.   No facility-administered  encounter medications on file as of 05/13/2020.    Allergies (verified) Penicillins, Shellfish allergy, Shellfish-derived products, Codeine, Infliximab, Sulfonamide derivatives, and Azithromycin   History: Past Medical History:  Diagnosis Date  . Anemia    Associated with leukopenia and lymphocytosis. This is been evaluated by Dr. Marin Olp . Her platelet count has been normal  . Arthritis   . Chronic back pain   . Chronic kidney disease   . Erythropoietin deficiency anemia 02/23/2016  . GERD (gastroesophageal reflux disease)   . Hypertension   . Lymphocytosis   . MGUS (monoclonal gammopathy of unknown significance) 08/03/2012   IgA 792 11/04/11  . Pneumonia 04/2010   OP  . Renal insufficiency   . Sarcoidosis   . Thyroid disease    Past Surgical History:  Procedure Laterality Date  . CARPAL TUNNEL RELEASE     Bilateral   . CATARACT EXTRACTION Right 05/2015  . CATARACT EXTRACTION Left   . CERVICAL DISCECTOMY  06/2010   Dr.Pool  . CHOLECYSTECTOMY    . COLONOSCOPY  2010  . POSTERIOR LUMBAR FUSION  02/29/2012   Dr Alyson Locket op respiratory & renal compromise  . THYROIDECTOMY  2003   For goiter  . UPPER GASTROINTESTINAL ENDOSCOPY  12/01/2011   DB   Family History  Problem Relation Age of Onset  . Anemia Father   . Arthritis Father   . Heart failure Father 43       No CAD  . Diabetes Mother   . Arthritis Mother   . Glaucoma Mother   . Heart disease Mother 40       No CAD  . Hypertension Brother   . Anemia Brother   . Kidney disease Brother   . Hypertension Sister   . Kidney disease Sister   . Anemia Sister   . Diabetes Maternal Aunt   . Arthritis Paternal Grandmother   . Colon cancer Neg Hx   . Colon polyps Neg Hx   . Esophageal cancer Neg Hx   . Rectal cancer Neg Hx   . Stomach cancer Neg Hx    Social History   Socioeconomic History  . Marital status: Divorced    Spouse name: Not on file  . Number of children: 0  . Years of education: 59  . Highest  education level: Not on file  Occupational History  . Occupation: Retired    Fish farm manager: RETIRED  Tobacco Use  . Smoking status: Former Smoker    Packs/day: 0.50    Years: 20.00    Pack years: 10.00    Types: Cigarettes    Quit date: 05/17/1997    Years since quitting: 23.0  . Smokeless tobacco: Never Used  Vaping Use  . Vaping Use: Never used  Substance and Sexual Activity  . Alcohol use: Yes    Alcohol/week: 1.0 standard drink    Types: 1 Glasses of wine per week    Comment: occasionally , less than weekly  . Drug use: No  . Sexual activity: Not Currently  Other Topics Concern  . Not on file  Social History Narrative   Patient is divorced  and lives with her sister. Patient is retired.   Caffeine- sometimes - one cup   Right handed.   Social Determinants of Health   Financial Resource Strain: Medium Risk  . Difficulty of Paying Living Expenses: Somewhat hard  Food Insecurity: Not on file  Transportation Needs: Not on file  Physical Activity: Not on file  Stress: Not on file  Social Connections: Not on file    Tobacco Counseling Counseling given: Not Answered   Clinical Intake:  Pre-visit preparation completed: Yes  Pain : No/denies pain     Nutritional Risks: None Diabetes: No  What is the last grade level you completed in school?: High School Graduate  Diabetic? no  Interpreter Needed?: No  Information entered by :: Lisette Abu, LPN   Activities of Daily Living In your present state of health, do you have any difficulty performing the following activities: 05/13/2020  Hearing? N  Vision? N  Difficulty concentrating or making decisions? N  Walking or climbing stairs? N  Dressing or bathing? N  Doing errands, shopping? N  Preparing Food and eating ? N  Using the Toilet? N  In the past six months, have you accidently leaked urine? N  Do you have problems with loss of bowel control? N  Managing your Medications? N  Managing your Finances? N   Housekeeping or managing your Housekeeping? N  Some recent data might be hidden    Patient Care Team: Binnie Rail, MD as PCP - General (Internal Medicine) Charlton Haws, [facility] as Pharmacist (Pharmacist)  Indicate any recent Medical Services you may have received from other than [facility] providers in the past year (date may be approximate).     Assessment:   This is a routine wellness examination for Tina Molina.  Hearing/Vision screen No exam data present  Dietary issues and exercise activities discussed: Current Exercise Habits: Home exercise routine, Type of exercise: Other - see comments (ellipitcal bike), Time (Minutes): 20, Frequency (Times/Week): 1, Weekly Exercise (Minutes/Week): 20, Intensity: Mild (depends on how she feels), Exercise limited by: respiratory conditions(s);cardiac condition(s)  Goals    . patient     Try to eat less fried foods;      Marland Kitchen Patient Stated     Increase my physical activity. I Molina start to do the elliptical 3 times weekly for 5 minutes.    . Patient Stated     Increase physical activity by riding elipical.     . [place] (see longitudinal plan of care for additional care plan information)  Current Barriers:  . Chronic Disease Management support, education, and care coordination needs related to Hypertension, Hyperlipidemia, Hypothyroidism, and chronic pain   Hypertension BP Readings from Last 3 Encounters:  04/03/20 (!) (P) 146/79  02/21/20 127/62  02/15/20 138/70 .  Pharmacist Clinical Goal(s): o Over the next 90 days, patient will work with PharmD and providers to maintain BP goal <130/80 . Current regimen:  o Carvedilol 25 mg twice a day o Hydralazine 50 mg 3 times a day o Spironolactone 50 mg - 1/2 tab daily o Telmisartan 80 mg daily - 1/2 tab daily o Furosemide 20 mg daily . Interventions: o Discussed BP goals and benefits of medications for prevention of heart attack / stroke o Recommend to  change telmisartan Rx to 40 mg and spironolactone Rx to 25 mg to avoid splitting pills . Patient self care activities - Over the next 90 days, patient will: o Check  BP daily, document, and provide at future appointments o Ensure daily salt intake < 2300 mg/day  Hyperlipidemia Lab Results  Component Value Date/Time   LDLCALC 88 08/14/2019 01:47 PM   LDLDIRECT 156.5 05/27/2011 04:19 PM .  Pharmacist Clinical Goal(s): o Over the next 90 days, patient will work with PharmD and providers to maintain LDL goal < 100 . Current regimen:  o Atorvastatin 20 mg daily . Interventions: o Discussed cholesterol goals and benefits of medications for prevention of heart attack / stroke . Patient self care activities - Over the next 90 days, patient will: o Continue current medications  Chronic pain . Pharmacist Clinical Goal(s) o Over the next 90 days, patient will work with PharmD and providers to optimize therapy . Current regimen:  o Leflunomide 20 mg daily  o Gabapentin 300 mg daily (taking 2-3 times daily) o Cyclobenzaprine 10 mg 3 times daily as needed . Interventions: o Discussed gabapentin Molina cause oversedation; advised to reduce dose to once daily as prescribed . Patient self care activities - Over the next 90 days, patient will: o Reduce gabapentin to once daily  Hypothyroidism . Pharmacist Clinical Goal(s) o Over the next 90 days, patient will work with PharmD and providers to optimize therapy . Current regimen:  o Levothyroxine 200 mcg daily (taking extra 1/2 dose once a week) . Interventions: o Discussed extra 1/2 is unnecessary since PCP increased dosage; advised to just take one tablet daily . Patient self care activities - Over the next 90 days, patient will: o Take levothyroxine once daily as prescribed  Medication management . Pharmacist Clinical Goal(s): o Over the next 90 days, patient will work with PharmD and providers to achieve optimal medication adherence . [facility]  [facility]acy[facility] . Interventions o Comprehensive medication review performed. o Utilize [facility] pharmacy for medication synchronization, packaging and delivery . Patient self care activities - Over the next 90 days, patient will: o Focus on medication adherence by fill date o Take medications as prescribed o Report any questions or concerns to PharmD and/or provider(s)  Initial goal documentation      Depression Screen PHQ 2/9 Scores 05/13/2020 02/15/2020 02/12/2019 07/17/2018 04/20/2017 04/20/2017 12/31/2015  PHQ - 2 Score 0 0 0 0 2 0 0  PHQ- 9 Score - - - - 4 - -    Fall Risk Fall Risk  05/13/2020 02/12/2019 07/17/2018 07/17/2018 10/25/2017  Falls in the past year? 1 0 0 0 No  Number falls in past yr: 0 0 - - -  Injury with Fall? 0 - - - -  Risk for fall due to : - - - - -  Risk for fall due to: Comment - - - - -  Follow up Falls evaluation completed - - - -    FALL RISK PREVENTION PERTAINING TO THE HOME:  Any stairs in or around the home? No  If so, are there any without handrails? No  Home free of loose throw rugs in walkways, pet beds, electrical cords, etc? Yes  Adequate lighting in your home to reduce risk of falls? Yes   ASSISTIVE DEVICES UTILIZED TO PREVENT FALLS:  Life alert? No  Use of a cane, walker or w/c? Yes  Grab bars in the bathroom? Yes  Shower chair or bench in shower? Yes  Elevated toilet seat or a handicapped toilet? Yes   TIMED UP AND GO:  Was the test performed? No .  Length of time to ambulate 10 feet: 0 sec.  Gait steady and fast without use of assistive device  Cognitive Function: MMSE - Mini Mental State Exam 04/20/2017 12/31/2015  Not completed: - (No Data)  Orientation to time 5 -  Orientation to Place 5 -  Registration 3 -  Attention/ Calculation 5 -  Recall 1 -  Language- name 2 objects 2 -  Language- repeat 1 -  Language- follow 3 step command 3 -  Language- read & follow direction 1 -  Write a sentence 1 -  Copy design 1 -   Total score 28 -        Immunizations Immunization History  Administered Date(s) Administered  . Fluad Quad(high Dose 65+) 02/12/2019, 02/15/2020  . Influenza Whole 01/20/2010  . Influenza, High Dose Seasonal PF 03/01/2013, 03/13/2015, 04/20/2017, 03/30/2018  . Influenza,inj,Quad PF,6+ Mos 04/05/2016  . PFIZER SARS-COV-2 Vaccination 07/26/2019, 08/16/2019  . Pneumococcal Conjugate-13 01/13/2015  . Pneumococcal Polysaccharide-23 05/30/2013  . Td 06/01/2009    TDAP status: Due, Education has been provided regarding the importance of this vaccine. Advised may receive this vaccine at local pharmacy or Health Dept. Aware to provide a copy of the vaccination record if obtained from local pharmacy or Health Dept. Verbalized acceptance and understanding.  Flu Vaccine status: Up to date  Pneumococcal vaccine status: Up to date  Covid-19 vaccine status: Completed vaccines  Qualifies for Shingles Vaccine? Yes   Zostavax completed No   Shingrix Completed?: No.    Education has been provided regarding the importance of this vaccine. Patient has been advised to call insurance company to determine out of pocket expense if they have not yet received this vaccine. Advised may also receive vaccine at local pharmacy or Health Dept. Verbalized acceptance and understanding.  Screening Tests Health Maintenance  Topic Date Due  . TETANUS/TDAP  06/02/2019  . COVID-19 Vaccine (3 - Pfizer risk 4-dose series) 09/13/2019  . MAMMOGRAM  07/27/2020  . DEXA SCAN  07/28/2023  . COLONOSCOPY (Pts 45-37yrs Insurance coverage will need to be confirmed)  10/25/2029  . INFLUENZA VACCINE  Completed  . Hepatitis C Screening  Completed  . PNA vac Low Risk Adult  Completed    Health Maintenance  Health Maintenance Due  Topic Date Due  . TETANUS/TDAP  06/02/2019  . COVID-19 Vaccine (3 - Pfizer risk 4-dose series) 09/13/2019    Colorectal cancer screening: Type of screening: Colonoscopy. Completed 10/26/2019.  Repeat every 0 years  Mammogram status: Completed 07/28/2018. Repeat every year  Bone Density status: Completed 07/28/2018. Results reflect: Bone density results: NORMAL. Repeat every 5 years.  Lung Cancer Screening: (Low Dose CT Chest recommended if Age 74-80 years, 30 pack-year currently smoking OR have quit w/in 15years.) does not qualify.   Lung Cancer Screening Referral: no  Additional Screening:  Hepatitis C Screening: does qualify; Completed yes  Vision Screening: Recommended annual ophthalmology exams for early detection of glaucoma and other disorders of the eye. Is the patient up to date with their annual eye exam?  Yes  Who is the provider or what is the name of the office in which the patient attends annual eye exams? Camillo Flaming, MD. If pt is not established with a provider, would they like to be referred to a provider to establish care? No .   Dental Screening: Recommended annual dental exams for proper oral hygiene  Community Resource Referral / Chronic Care Management: CRR required this visit?  No   CCM required this visit?  No      Plan:  I have personally reviewed and noted the following in the patient's chart:   . Medical and social history . Use of alcohol, tobacco or illicit drugs  . Current medications and supplements . Functional ability and status . Nutritional status . Physical activity . Advanced directives . List of other physicians . Hospitalizations, surgeries, and ER visits in previous 12 months . Vitals . Screenings to include cognitive, depression, and falls . Referrals and appointments  In addition, I have reviewed and discussed with patient certain preventive protocols, quality metrics, and best practice recommendations. A written personalized care plan for preventive services as well as general preventive health recommendations were provided to patient.     Sheral Flow, LPN   [id number]   Nurse Notes:  Patient is  cogitatively intact. There were no vitals filed for this visit. There is no height or weight on file to calculate BMI.

## 2020-05-13 NOTE — Patient Instructions (Signed)
Tina Molina , Thank you for taking time to come for your Medicare Wellness Visit. I appreciate your ongoing commitment to your health goals. Please review the following plan we discussed and let me know if I can assist you in the future.   Screening recommendations/referrals: Colonoscopy: 10/26/2019 Mammogram: 07/28/2018 Bone Density: 07/28/2018 Recommended yearly ophthalmology/optometry visit for glaucoma screening and checkup Recommended yearly dental visit for hygiene and checkup  Vaccinations: Influenza vaccine: 02/15/2020 Pneumococcal vaccine: up to date Tdap vaccine: 06/01/2009; overdue; will check with local pharmacy Shingles vaccine: never done    Covid-19: up to date  Advanced directives: Advance directive discussed with you today. Even though you declined this today please call our office should you change your mind and we can give you the proper paperwork for you to fill out.  Conditions/risks identified: Yes; Reviewed health maintenance screenings with patient today and relevant education, vaccines, and/or referrals were provided. Please continue to do your personal lifestyle choices by: daily care of teeth and gums, regular physical activity (goal should be 5 days a week for 30 minutes), eat a healthy diet, avoid tobacco and drug use, limiting any alcohol intake, taking a low-dose aspirin (if not allergic or have been advised by your provider otherwise) and taking vitamins and minerals as recommended by your provider. Continue doing brain stimulating activities (puzzles, reading, adult coloring books, staying active) to keep memory sharp. Continue to eat heart healthy diet (full of fruits, vegetables, whole grains, lean protein, water--limit salt, fat, and sugar intake) and increase physical activity as tolerated.  Next appointment: Please schedule your next Medicare Wellness Visit with your Nurse Health Advisor in 1 year by calling [phone].   Preventive Care 29 Years and Older,  Female Preventive care refers to lifestyle choices and visits with your health care provider that can promote health and wellness. What does preventive care include?  A yearly physical exam. This is also called an annual well check.  Dental exams once or twice a year.  Routine eye exams. Ask your health care provider how often you should have your eyes checked.  Personal lifestyle choices, including:  Daily care of your teeth and gums.  Regular physical activity.  Eating a healthy diet.  Avoiding tobacco and drug use.  Limiting alcohol use.  Practicing safe sex.  Taking low-dose aspirin every day.  Taking vitamin and mineral supplements as recommended by your health care provider. What happens during an annual well check? The services and screenings done by your health care provider during your annual well check will depend on your age, overall health, lifestyle risk factors, and family history of disease. Counseling  Your health care provider may ask you questions about your:  Alcohol use.  Tobacco use.  Drug use.  Emotional well-being.  Home and relationship well-being.  Sexual activity.  Eating habits.  History of falls.  Memory and ability to understand (cognition).  Work and work Statistician.  Reproductive health. Screening  You may have the following tests or measurements:  Height, weight, and BMI.  Blood pressure.  Lipid and cholesterol levels. These may be checked every 5 years, or more frequently if you are over 46 years old.  Skin check.  Lung cancer screening. You may have this screening every year starting at age 16 if you have a 30-pack-year history of smoking and currently smoke or have quit within the past 15 years.  Fecal occult blood test (FOBT) of the stool. You may have this test every year starting at age  50.  Flexible sigmoidoscopy or colonoscopy. You may have a sigmoidoscopy every 5 years or a colonoscopy every 10 years  starting at age 34.  Hepatitis C blood test.  Hepatitis B blood test.  Sexually transmitted disease (STD) testing.  Diabetes screening. This is done by checking your blood sugar (glucose) after you have not eaten for a while (fasting). You may have this done every 1-3 years.  Bone density scan. This is done to screen for osteoporosis. You may have this done starting at age 93.  Mammogram. This may be done every 1-2 years. Talk to your health care provider about how often you should have regular mammograms. Talk with your health care provider about your test results, treatment options, and if necessary, the need for more tests. Vaccines  Your health care provider may recommend certain vaccines, such as:  Influenza vaccine. This is recommended every year.  Tetanus, diphtheria, and acellular pertussis (Tdap, Td) vaccine. You may need a Td booster every 10 years.  Zoster vaccine. You may need this after age 69.  Pneumococcal 13-valent conjugate (PCV13) vaccine. One dose is recommended after age 37.  Pneumococcal polysaccharide (PPSV23) vaccine. One dose is recommended after age 22. Talk to your health care provider about which screenings and vaccines you need and how often you need them. This information is not intended to replace advice given to you by your health care provider. Make sure you discuss any questions you have with your health care provider. Document Released: 05/30/2015 Document Revised: 01/21/2016 Document Reviewed: 03/04/2015 Elsevier Interactive Patient Education  2017 [employer] Prevention in the Home Falls can cause injuries. They can happen to people of all ages. There are many things you can do to make your home safe and to help prevent falls. What can I do on the outside of my home?  Regularly fix the edges of walkways and driveways and fix any cracks.  Remove anything that might make you trip as you walk through a door, such as a raised step or  threshold.  Trim any bushes or trees on the path to your home.  Use bright outdoor lighting.  Clear any walking paths of anything that might make someone trip, such as rocks or tools.  Regularly check to see if handrails are loose or broken. Make sure that both sides of any steps have handrails.  Any raised decks and porches should have guardrails on the edges.  Have any leaves, snow, or ice cleared regularly.  Use sand or salt on walking paths during winter.  Clean up any spills in your garage right away. This includes oil or grease spills. What can I do in the bathroom?  Use night lights.  Install grab bars by the toilet and in the tub and shower. Do not use towel bars as grab bars.  Use non-skid mats or decals in the tub or shower.  If you need to sit down in the shower, use a plastic, non-slip stool.  Keep the floor dry. Clean up any water that spills on the floor as soon as it happens.  Remove soap buildup in the tub or shower regularly.  Attach bath mats securely with double-sided non-slip rug tape.  Do not have throw rugs and other things on the floor that can make you trip. What can I do in the bedroom?  Use night lights.  Make sure that you have a light by your bed that is easy to reach.  Do not use any sheets  or blankets that are too big for your bed. They should not hang down onto the floor.  Have a firm chair that has side arms. You can use this for support while you get dressed.  Do not have throw rugs and other things on the floor that can make you trip. What can I do in the kitchen?  Clean up any spills right away.  Avoid walking on wet floors.  Keep items that you use a lot in easy-to-reach places.  If you need to reach something above you, use a strong step stool that has a grab bar.  Keep electrical cords out of the way.  Do not use floor polish or wax that makes floors slippery. If you must use wax, use non-skid floor wax.  Do not have  throw rugs and other things on the floor that can make you trip. What can I do with my stairs?  Do not leave any items on the stairs.  Make sure that there are handrails on both sides of the stairs and use them. Fix handrails that are broken or loose. Make sure that handrails are as long as the stairways.  Check any carpeting to make sure that it is firmly attached to the stairs. Fix any carpet that is loose or worn.  Avoid having throw rugs at the top or bottom of the stairs. If you do have throw rugs, attach them to the floor with carpet tape.  Make sure that you have a light switch at the top of the stairs and the bottom of the stairs. If you do not have them, ask someone to add them for you. What else can I do to help prevent falls?  Wear shoes that:  Do not have high heels.  Have rubber bottoms.  Are comfortable and fit you well.  Are closed at the toe. Do not wear sandals.  If you use a stepladder:  Make sure that it is fully opened. Do not climb a closed stepladder.  Make sure that both sides of the stepladder are locked into place.  Ask someone to hold it for you, if possible.  Clearly mark and make sure that you can see:  Any grab bars or handrails.  First and last steps.  Where the edge of each step is.  Use tools that help you move around (mobility aids) if they are needed. These include:  Canes.  Walkers.  Scooters.  Crutches.  Turn on the lights when you go into a dark area. Replace any light bulbs as soon as they burn out.  Set up your furniture so you have a clear path. Avoid moving your furniture around.  If any of your floors are uneven, fix them.  If there are any pets around you, be aware of where they are.  Review your medicines with your doctor. Some medicines can make you feel dizzy. This can increase your chance of falling. Ask your doctor what other things that you can do to help prevent falls. This information is not intended to  replace advice given to you by your health care provider. Make sure you discuss any questions you have with your health care provider. Document Released: 02/27/2009 Document Revised: 10/09/2015 Document Reviewed: 06/07/2014 Elsevier Interactive Patient Education  2017 [employer].

## 2020-05-15 ENCOUNTER — Other Ambulatory Visit: Payer: Self-pay

## 2020-05-15 ENCOUNTER — Other Ambulatory Visit: Payer: Medicare Other

## 2020-05-15 ENCOUNTER — Encounter: Payer: Self-pay | Admitting: Family

## 2020-05-15 ENCOUNTER — Inpatient Hospital Stay (HOSPITAL_BASED_OUTPATIENT_CLINIC_OR_DEPARTMENT_OTHER): Payer: Medicare Other | Admitting: Family

## 2020-05-15 ENCOUNTER — Ambulatory Visit: Payer: Medicare Other | Admitting: Family

## 2020-05-15 ENCOUNTER — Inpatient Hospital Stay: Payer: Medicare Other | Attending: Hematology & Oncology

## 2020-05-15 ENCOUNTER — Ambulatory Visit: Payer: Medicare Other

## 2020-05-15 ENCOUNTER — Inpatient Hospital Stay: Payer: Medicare Other

## 2020-05-15 VITALS — BP 119/60 | HR 84 | Temp 98.1°F | Resp 18 | Ht 64.0 in | Wt 203.8 lb

## 2020-05-15 DIAGNOSIS — D508 Other iron deficiency anemias: Secondary | ICD-10-CM

## 2020-05-15 DIAGNOSIS — R202 Paresthesia of skin: Secondary | ICD-10-CM | POA: Insufficient documentation

## 2020-05-15 DIAGNOSIS — D472 Monoclonal gammopathy: Secondary | ICD-10-CM | POA: Diagnosis not present

## 2020-05-15 DIAGNOSIS — D509 Iron deficiency anemia, unspecified: Secondary | ICD-10-CM | POA: Insufficient documentation

## 2020-05-15 DIAGNOSIS — R200 Anesthesia of skin: Secondary | ICD-10-CM | POA: Diagnosis not present

## 2020-05-15 DIAGNOSIS — D631 Anemia in chronic kidney disease: Secondary | ICD-10-CM

## 2020-05-15 DIAGNOSIS — Z79899 Other long term (current) drug therapy: Secondary | ICD-10-CM | POA: Diagnosis not present

## 2020-05-15 DIAGNOSIS — N182 Chronic kidney disease, stage 2 (mild): Secondary | ICD-10-CM | POA: Diagnosis not present

## 2020-05-15 LAB — CBC WITH DIFFERENTIAL (CANCER CENTER ONLY)
Abs Immature Granulocytes: 0.01 10*3/uL (ref 0.00–0.07)
Basophils Absolute: 0 10*3/uL (ref 0.0–0.1)
Basophils Relative: 1 %
Eosinophils Absolute: 0.1 10*3/uL (ref 0.0–0.5)
Eosinophils Relative: 2 %
HCT: 34.9 % — ABNORMAL LOW (ref 36.0–46.0)
Hemoglobin: 11.3 g/dL — ABNORMAL LOW (ref 12.0–15.0)
Immature Granulocytes: 0 %
Lymphocytes Relative: 51 %
Lymphs Abs: 2.2 10*3/uL (ref 0.7–4.0)
MCH: 32.8 pg (ref 26.0–34.0)
MCHC: 32.4 g/dL (ref 30.0–36.0)
MCV: 101.5 fL — ABNORMAL HIGH (ref 80.0–100.0)
Monocytes Absolute: 0.5 10*3/uL (ref 0.1–1.0)
Monocytes Relative: 12 %
Neutro Abs: 1.5 10*3/uL — ABNORMAL LOW (ref 1.7–7.7)
Neutrophils Relative %: 34 %
Platelet Count: 144 10*3/uL — ABNORMAL LOW (ref 150–400)
RBC: 3.44 MIL/uL — ABNORMAL LOW (ref 3.87–5.11)
RDW: 14.1 % (ref 11.5–15.5)
WBC Count: 4.3 10*3/uL (ref 4.0–10.5)
nRBC: 0 % (ref 0.0–0.2)

## 2020-05-15 LAB — RETICULOCYTES
Immature Retic Fract: 7.3 % (ref 2.3–15.9)
RBC.: 3.46 MIL/uL — ABNORMAL LOW (ref 3.87–5.11)
Retic Count, Absolute: 27 10*3/uL (ref 19.0–186.0)
Retic Ct Pct: 0.8 % (ref 0.4–3.1)

## 2020-05-15 LAB — CMP (CANCER CENTER ONLY)
ALT: 15 U/L (ref 0–44)
AST: 18 U/L (ref 15–41)
Albumin: 4.3 g/dL (ref 3.5–5.0)
Alkaline Phosphatase: 59 U/L (ref 38–126)
Anion gap: 9 (ref 5–15)
BUN: 42 mg/dL — ABNORMAL HIGH (ref 8–23)
CO2: 27 mmol/L (ref 22–32)
Calcium: 9.8 mg/dL (ref 8.9–10.3)
Chloride: 108 mmol/L (ref 98–111)
Creatinine: 2.34 mg/dL — ABNORMAL HIGH (ref 0.44–1.00)
GFR, Estimated: 21 mL/min — ABNORMAL LOW (ref >60)
Glucose, Bld: 107 mg/dL — ABNORMAL HIGH (ref 70–99)
Potassium: 4.7 mmol/L (ref 3.5–5.1)
Sodium: 144 mmol/L (ref 135–145)
Total Bilirubin: 0.4 mg/dL (ref 0.3–1.2)
Total Protein: 7.4 g/dL (ref 6.5–8.1)

## 2020-05-15 NOTE — Progress Notes (Signed)
Hematology and Oncology Follow Up Visit  Tina Molina [id number] Jul 23, 1945 74 y.o. 05/15/2020   Principle Diagnosis:  Anemia of chronic renal failure - stage II MGUS - IgA lambda Iron deficiency anemia  Current Therapy: Aranesp 300 mcg SQ to keep Hgb > 11 IV iron as indicated   Interim History:  Tina Molina is here today for follow-up. She is doing fairly well but has noted fatigue.  No blood loss noted. No abnormal bruising, no petechiae.  No fever, chills, n/v, cough, rash, dizziness, SOB, chest pain, palpitations, abdominal pain or changes in bowel or bladder habits.  She has intermittent puffiness in her hands and legs in the morning that resolves once she gets up and moves around. No swelling or tenderness in her extremities at this time.  She has occasional numbness and tingling in her feet.  No falls or syncope.  She has maintained a good appetite and is staying well hydrated. Her weight is stable.   ECOG Performance Status: 1 - Symptomatic but completely ambulatory  Medications:  Allergies as of 05/15/2020      Reactions   Penicillins Other (See Comments)   Intolerance or allergy not remembered; her sister stated penicillin caused "boils under the skin"   Shellfish Allergy Rash   Shellfish-derived Products Anaphylaxis   Codeine Nausea Only   Nausea only with liquid codeine   Infliximab Other (See Comments)    Confusion (intolerance) Knots under the skin   Sulfonamide Derivatives Rash   Joint pain   Azithromycin Itching      Medication List       Accurate as of May 15, 2020  1:13 PM. If you have any questions, ask your nurse or doctor.        aspirin 81 MG tablet Take 81 mg by mouth daily.   atorvastatin 20 MG tablet Commonly known as: LIPITOR TAKE 1 TABLET(20 MG) BY MOUTH DAILY   bimatoprost 0.03 % ophthalmic solution Commonly known as: LUMIGAN 1 drop at bedtime.   budesonide-formoterol 160-4.5 MCG/ACT inhaler Commonly known as:  Symbicort inhale 2 puffs if needed for wheezing   carvedilol 25 MG tablet Commonly known as: COREG TAKE 1 TABLET(25 MG) BY MOUTH TWICE DAILY   cyclobenzaprine 10 MG tablet Commonly known as: FLEXERIL Take 10 mg by mouth 3 (three) times daily as needed for muscle spasms.   famotidine 40 MG tablet Commonly known as: PEPCID TAKE 1 TABLET(40 MG) BY MOUTH TWICE DAILY   furosemide 20 MG tablet Commonly known as: LASIX Take 1 tablet (20 mg total) by mouth daily.   gabapentin 300 MG capsule Commonly known as: NEURONTIN TAKE 1 CAPSULE(300 MG) BY MOUTH DAILY   hydrALAZINE 50 MG tablet Commonly known as: APRESOLINE Take 1 tablet (50 mg total) by mouth 3 (three) times daily.   leflunomide 20 MG tablet Commonly known as: ARAVA Take 20 mg by mouth daily.   levothyroxine 200 MCG tablet Commonly known as: Synthroid Take 1 tablet (200 mcg total) by mouth daily before breakfast.   MELATONIN GUMMIES PO Take by mouth at bedtime as needed.   multivitamin with minerals Tabs tablet Take 1 tablet by mouth daily.   spironolactone 25 MG tablet Commonly known as: Aldactone Take 1 tablet (25 mg total) by mouth daily.   telmisartan 40 MG tablet Commonly known as: MICARDIS Take 1 tablet (40 mg total) by mouth daily. What changed: additional instructions   vitamin E 180 MG (400 UNITS) capsule Take 400 Units by mouth daily.  Allergies:  Allergies  Allergen Reactions  . Penicillins Other (See Comments)    Intolerance or allergy not remembered; her sister stated penicillin caused "boils under the skin"  . Shellfish Allergy Rash  . Shellfish-Derived Products Anaphylaxis  . Codeine Nausea Only    Nausea only with liquid codeine  . Infliximab Other (See Comments)     Confusion (intolerance) Knots under the skin  . Sulfonamide Derivatives Rash    Joint pain  . Azithromycin Itching    Past Medical History, Surgical history, Social history, and Family History were reviewed and  updated.  Review of Systems: All other 10 point review of systems is negative.   Physical Exam:  vitals were not taken for this visit.   Wt Readings from Last 3 Encounters:  04/03/20 (P) 203 lb 1.9 oz (92.1 kg)  02/21/20 206 lb (93.4 kg)  02/15/20 203 lb 3.2 oz (92.2 kg)    Ocular: Sclerae unicteric, pupils equal, round and reactive to light Ear-nose-throat: Oropharynx clear, dentition fair Lymphatic: No cervical or supraclavicular adenopathy Lungs no rales or rhonchi, good excursion bilaterally Heart regular rate and rhythm, no murmur appreciated Abd soft, nontender, positive bowel sounds MSK no focal spinal tenderness, no joint edema Neuro: non-focal, well-oriented, appropriate affect Breasts: Deferred   Lab Results  Component Value Date   WBC 4.3 05/15/2020   HGB 11.3 (L) 05/15/2020   HCT 34.9 (L) 05/15/2020   MCV 101.5 (H) 05/15/2020   PLT 144 (L) 05/15/2020   Lab Results  Component Value Date   FERRITIN 222 04/03/2020   IRON 140 04/03/2020   TIBC 278 04/03/2020   UIBC 138 04/03/2020   IRONPCTSAT 50 04/03/2020   Lab Results  Component Value Date   RETICCTPCT 0.8 05/15/2020   RBC 3.46 (L) 05/15/2020   RETICCTABS 51.6 01/03/2013   Lab Results  Component Value Date   KPAFRELGTCHN 25.9 (H) 01/11/2020   LAMBDASER 37.5 (H) 01/11/2020   KAPLAMBRATIO 0.69 01/11/2020   Lab Results  Component Value Date   IGGSERUM 962 01/11/2020   IGA 583 (H) 01/11/2020   IGMSERUM 25 (L) 01/11/2020   Lab Results  Component Value Date   TOTALPROTELP 7.0 01/11/2020   ALBUMINELP 3.9 01/11/2020   A1GS 0.2 01/11/2020   A2GS 0.7 01/11/2020   BETS 1.3 01/11/2020   BETA2SER 10.3 (H) 11/04/2011   GAMS 0.9 01/11/2020   MSPIKE 0.3 (H) 01/11/2020   SPEI Comment 01/11/2020     Chemistry      Component Value Date/Time   NA 141 04/03/2020 1322   NA 150 (H) 03/01/2017 1308   NA 143 04/05/2016 1156   K 4.9 04/03/2020 1322   K 4.0 03/01/2017 1308   K 3.8 04/05/2016 1156   CL 108  04/03/2020 1322   CL 109 (H) 03/01/2017 1308   CO2 26 04/03/2020 1322   CO2 28 03/01/2017 1308   CO2 21 (L) 04/05/2016 1156   BUN 35 (H) 04/03/2020 1322   BUN 19 03/01/2017 1308   BUN 18.5 04/05/2016 1156   CREATININE 2.21 (H) 04/03/2020 1322   CREATININE 1.8 (H) 03/01/2017 1308   CREATININE 1.5 (H) 04/05/2016 1156      Component Value Date/Time   CALCIUM 9.5 04/03/2020 1322   CALCIUM 9.7 03/01/2017 1308   CALCIUM 9.3 04/05/2016 1156   ALKPHOS 65 04/03/2020 1322   ALKPHOS 55 03/01/2017 1308   ALKPHOS 77 04/05/2016 1156   AST 20 04/03/2020 1322   AST 21 04/05/2016 1156   ALT 22 04/03/2020  1322   ALT 18 03/01/2017 1308   ALT 22 04/05/2016 1156   BILITOT 0.3 04/03/2020 1322   BILITOT 0.36 04/05/2016 1156       Impression and Plan: Tina Molina is a very pleasant 34yoAfrican American female with multifactorial anemia as well as an IgA lambda MGUS. No ESA needed for Hgb 11.3.  Iron studies are pending. We will replace if needed.  No follow-up in 2 months.  She can contact our office with any questions or concerns. We can certainly see her sooner if needed.   Laverna Peace, NP 12/30/20211:13 PM

## 2020-05-19 LAB — FERRITIN: Ferritin: 236 ng/mL (ref 11–307)

## 2020-05-19 LAB — IRON AND TIBC
Iron: 118 ug/dL (ref 41–142)
Saturation Ratios: 43 % (ref 21–57)
TIBC: 276 ug/dL (ref 236–444)
UIBC: 158 ug/dL (ref 120–384)

## 2020-06-12 ENCOUNTER — Telehealth: Payer: Self-pay | Admitting: Pharmacist

## 2020-06-16 ENCOUNTER — Telehealth: Payer: Self-pay | Admitting: Pharmacist

## 2020-06-16 NOTE — Progress Notes (Signed)
Chronic Care Management Pharmacy Assistant   Name: Tina Molina  MRN: [id number] DOB: [birth year]  Reason for Encounter: Chart Review   PCP : Binnie Rail, MD  Allergies:   Allergies  Allergen Reactions   Penicillins Other (See Comments)    Intolerance or allergy not remembered; her sister stated penicillin caused "boils under the skin"   Shellfish Allergy Rash   Shellfish-Derived Products Anaphylaxis   Codeine Nausea Only    Nausea only with liquid codeine   Infliximab Other (See Comments)     Confusion (intolerance) Knots under the skin   Sulfonamide Derivatives Rash    Joint pain   Azithromycin Itching    Medications: Outpatient Encounter Medications as of 06/16/2020  Medication Sig   aspirin 81 MG tablet Take 81 mg by mouth daily.   atorvastatin (LIPITOR) 20 MG tablet TAKE 1 TABLET(20 MG) BY MOUTH DAILY   bimatoprost (LUMIGAN) 0.03 % ophthalmic solution 1 drop at bedtime.   budesonide-formoterol (SYMBICORT) 160-4.5 MCG/ACT inhaler inhale 2 puffs if needed for wheezing   carvedilol (COREG) 25 MG tablet TAKE 1 TABLET(25 MG) BY MOUTH TWICE DAILY   cyclobenzaprine (FLEXERIL) 10 MG tablet Take 10 mg by mouth 3 (three) times daily as needed for muscle spasms.   famotidine (PEPCID) 40 MG tablet TAKE 1 TABLET(40 MG) BY MOUTH TWICE DAILY   furosemide (LASIX) 20 MG tablet Take 1 tablet (20 mg total) by mouth daily.   gabapentin (NEURONTIN) 300 MG capsule TAKE 1 CAPSULE(300 MG) BY MOUTH DAILY   hydrALAZINE (APRESOLINE) 50 MG tablet Take 1 tablet (50 mg total) by mouth 3 (three) times daily.   leflunomide (ARAVA) 20 MG tablet Take 20 mg by mouth daily.   levothyroxine (SYNTHROID) 200 MCG tablet Take 1 tablet (200 mcg total) by mouth daily before breakfast.   MELATONIN GUMMIES PO Take by mouth at bedtime as needed.   Multiple Vitamin (MULTIVITAMIN WITH MINERALS) TABS Take 1 tablet by mouth daily.   spironolactone (ALDACTONE) 25 MG tablet Take 1 tablet  (25 mg total) by mouth daily.   telmisartan (MICARDIS) 40 MG tablet Take 1 tablet (40 mg total) by mouth daily. (Patient taking differently: Take 40 mg by mouth daily. Take 1/2 tablet daily)   vitamin E 400 UNIT capsule Take 400 Units by mouth daily.    [DISCONTINUED] modafinil (PROVIGIL) 100 MG tablet Take 100 mg by mouth daily.   [DISCONTINUED] potassium chloride (MICRO-K) 10 MEQ CR capsule Take 1 capsule (10 mEq total) by mouth 2 (two) times daily.   No facility-administered encounter medications on file as of 06/16/2020.    Current Diagnosis: Patient Active Problem List   Diagnosis Date Noted   Nocturia more than twice per night 10/25/2017   Insomnia secondary to chronic pain 10/25/2017   Upper back pain on left side 04/20/2017   IDA (iron deficiency anemia) 04/06/2016   Erythropoietin deficiency anemia 02/23/2016   GERD (gastroesophageal reflux disease) 06/25/2015   Hyperlipidemia 01/13/2015   Hyperglycemia 01/13/2015   Rheumatoid factor positive 02/20/2014   Thoracic radiculopathy 10/30/2013   Circadian rhythm sleep disorder, shift work type 11/03/2012   Hematuria 08/05/2012   MGUS (monoclonal gammopathy of unknown significance) 08/03/2012   Lumbar stenosis with neurogenic claudication 02/29/2012   Edema 09/13/2011   Chronic kidney disease, stage IV (severe) ([place]) 12/19/2009   HERNIATED LUMBAR DISC 12/19/2009   PULMONARY SARCOIDOSIS 10/16/2009   Hypothyroidism 10/16/2009   Anemia associated with stage 2 chronic renal failure 10/16/2009   CARPAL TUNNEL SYNDROME,  BILATERAL 10/16/2009   UVEITIS 10/16/2009   Essential hypertension 10/16/2009   ARTHRITIS 10/16/2009   CERVICAL DISC DISORDER 10/16/2009    Goals Addressed   None     Follow-Up:  Pharmacist Review    Reviewed chart for medication changes and adherence. There was no changes in medication during this visit. The patient is on track with goals.   No gaps in adherence identified.  Patient has follow up scheduled with pharmacy team. No further action required.   Jabier Gauss, [place] [phone]

## 2020-06-18 ENCOUNTER — Telehealth: Payer: Self-pay | Admitting: Pharmacist

## 2020-06-18 NOTE — Progress Notes (Signed)
Chronic Care Management Pharmacy Assistant   Name: Tina Molina  MRN: [id number] DOB: 03-18-1946  Reason for Encounter: Medication Review  Patient Questions:  1.  Have you seen any other providers since your last visit? No  2.  Any changes in your medicines or health? No    PCP : Binnie Rail, MD  Allergies:   Allergies  Allergen Reactions   Penicillins Other (See Comments)    Intolerance or allergy not remembered; her sister stated penicillin caused "boils under the skin"   Shellfish Allergy Rash   Shellfish-Derived Products Anaphylaxis   Codeine Nausea Only    Nausea only with liquid codeine   Infliximab Other (See Comments)     Confusion (intolerance) Knots under the skin   Sulfonamide Derivatives Rash    Joint pain   Azithromycin Itching    Medications: Outpatient Encounter Medications as of 06/18/2020  Medication Sig   aspirin 81 MG tablet Take 81 mg by mouth daily.   atorvastatin (LIPITOR) 20 MG tablet TAKE 1 TABLET(20 MG) BY MOUTH DAILY   bimatoprost (LUMIGAN) 0.03 % ophthalmic solution 1 drop at bedtime.   budesonide-formoterol (SYMBICORT) 160-4.5 MCG/ACT inhaler inhale 2 puffs if needed for wheezing   carvedilol (COREG) 25 MG tablet TAKE 1 TABLET(25 MG) BY MOUTH TWICE DAILY   cyclobenzaprine (FLEXERIL) 10 MG tablet Take 10 mg by mouth 3 (three) times daily as needed for muscle spasms.   famotidine (PEPCID) 40 MG tablet TAKE 1 TABLET(40 MG) BY MOUTH TWICE DAILY   furosemide (LASIX) 20 MG tablet Take 1 tablet (20 mg total) by mouth daily.   gabapentin (NEURONTIN) 300 MG capsule TAKE 1 CAPSULE(300 MG) BY MOUTH DAILY   hydrALAZINE (APRESOLINE) 50 MG tablet Take 1 tablet (50 mg total) by mouth 3 (three) times daily.   leflunomide (ARAVA) 20 MG tablet Take 20 mg by mouth daily.   levothyroxine (SYNTHROID) 200 MCG tablet Take 1 tablet (200 mcg total) by mouth daily before breakfast.   MELATONIN GUMMIES PO Take by mouth at bedtime as needed.    Multiple Vitamin (MULTIVITAMIN WITH MINERALS) TABS Take 1 tablet by mouth daily.   spironolactone (ALDACTONE) 25 MG tablet Take 1 tablet (25 mg total) by mouth daily.   telmisartan (MICARDIS) 40 MG tablet Take 1 tablet (40 mg total) by mouth daily. (Patient taking differently: Take 40 mg by mouth daily. Take 1/2 tablet daily)   vitamin E 400 UNIT capsule Take 400 Units by mouth daily.    [DISCONTINUED] modafinil (PROVIGIL) 100 MG tablet Take 100 mg by mouth daily.   [DISCONTINUED] potassium chloride (MICRO-K) 10 MEQ CR capsule Take 1 capsule (10 mEq total) by mouth 2 (two) times daily.   No facility-administered encounter medications on file as of 06/18/2020.    Current Diagnosis: Patient Active Problem List   Diagnosis Date Noted   Nocturia more than twice per night 10/25/2017   Insomnia secondary to chronic pain 10/25/2017   Upper back pain on left side 04/20/2017   IDA (iron deficiency anemia) 04/06/2016   Erythropoietin deficiency anemia 02/23/2016   GERD (gastroesophageal reflux disease) 06/25/2015   Hyperlipidemia 01/13/2015   Hyperglycemia 01/13/2015   Rheumatoid factor positive 02/20/2014   Thoracic radiculopathy 10/30/2013   Circadian rhythm sleep disorder, shift work type 11/03/2012   Hematuria 08/05/2012   MGUS (monoclonal gammopathy of unknown significance) 08/03/2012   Lumbar stenosis with neurogenic claudication 02/29/2012   Edema 09/13/2011   Chronic kidney disease, stage IV (severe) ([place]) 12/19/2009  HERNIATED LUMBAR DISC 12/19/2009   PULMONARY SARCOIDOSIS 10/16/2009   Hypothyroidism 10/16/2009   Anemia associated with stage 2 chronic renal failure 10/16/2009   CARPAL TUNNEL SYNDROME, BILATERAL 10/16/2009   UVEITIS 10/16/2009   Essential hypertension 10/16/2009   ARTHRITIS 10/16/2009   CERVICAL [place] DISORDER 10/16/2009    Goals Addressed   None     Follow-Up:  Coordination of Enhanced Pharmacy Services   Reviewed chart  for medication changes ahead of medication coordination call.  No OVs, Consults, or hospital visits since last care coordination call/Pharmacist visit.  No medication changes indicated   BP Readings from Last 3 Encounters:  05/15/20 119/60  04/03/20 (!) (P) 146/79  02/21/20 127/62    Lab Results  Component Value Date   HGBA1C 4.5 (L) 08/14/2019     Patient obtains medications through Adherence Packaging  90 Days   Last adherence delivery included:  Atorvastatin 20 mg 1 tab daily breakfast cyclobenzapr 10 mg 1 tab three time daily, breakfast, lunch, bedtime Lumigan sol 0.01% one drop affected eye every evening levothryoxine 200 mg mcg 1 tab at breakfast Spironolactone 50 mg 1/2 tab daily breakfast Furosemide 20 mg 1 tab daily breakfast    Patient is due for next adherence delivery on: 06/20/20. Called patient and reviewed medications and coordinated delivery.  This delivery to include: Atorvastatin 20 mg 1 tab daily breakfast cyclobenzapr 10 mg 1 tab three time daily, breakfast, lunch, bedtime Lumigan sol 0.01% one drop affected eye every evening levothryoxine 200 mg mcg 1 tab at breakfast Spironolactone 50 mg 1/2 tab daily breakfast Furosemide 20 mg 1 tab daily breakfast   Patient declined the following medications Leflunomide,telmisartan,and carvedilol patient has enough for two and a half weeks  Patient needs refills for cyclobenzapr from Dr. Annette Stable and furosemide from Dr. Quay Burow  Confirmed delivery date of 06/20/20, advised patient that pharmacy will contact them the morning of delivery.   Wendy Poet, [place] [phone]

## 2020-06-20 ENCOUNTER — Other Ambulatory Visit: Payer: Self-pay | Admitting: Internal Medicine

## 2020-06-20 ENCOUNTER — Telehealth: Payer: Self-pay | Admitting: Pharmacist

## 2020-06-20 NOTE — Progress Notes (Signed)
Chronic Care Management Pharmacy Assistant   Name: Tina Molina  MRN: [id number] DOB: 18-Mar-1946  Reason for Encounter: Chart Review   PCP : Binnie Rail, MD  Allergies:   Allergies  Allergen Reactions  . Penicillins Other (See Comments)    Intolerance or allergy not remembered; her sister stated penicillin caused "boils under the skin"  . Shellfish Allergy Rash  . Shellfish-Derived Products Anaphylaxis  . Codeine Nausea Only    Nausea only with liquid codeine  . Infliximab Other (See Comments)     Confusion (intolerance) Knots under the skin  . Sulfonamide Derivatives Rash    Joint pain  . Azithromycin Itching    Medications: Outpatient Encounter Medications as of 06/20/2020  Medication Sig  . aspirin 81 MG tablet Take 81 mg by mouth daily.  Marland Kitchen atorvastatin (LIPITOR) 20 MG tablet TAKE 1 TABLET(20 MG) BY MOUTH DAILY  . bimatoprost (LUMIGAN) 0.03 % ophthalmic solution 1 drop at bedtime.  . budesonide-formoterol (SYMBICORT) 160-4.5 MCG/ACT inhaler inhale 2 puffs if needed for wheezing  . carvedilol (COREG) 25 MG tablet TAKE 1 TABLET(25 MG) BY MOUTH TWICE DAILY  . cyclobenzaprine (FLEXERIL) 10 MG tablet Take 10 mg by mouth 3 (three) times daily as needed for muscle spasms.  . famotidine (PEPCID) 40 MG tablet TAKE 1 TABLET(40 MG) BY MOUTH TWICE DAILY  . furosemide (LASIX) 20 MG tablet Take 1 tablet (20 mg total) by mouth daily.  Marland Kitchen gabapentin (NEURONTIN) 300 MG capsule TAKE 1 CAPSULE(300 MG) BY MOUTH DAILY  . hydrALAZINE (APRESOLINE) 50 MG tablet Take 1 tablet (50 mg total) by mouth 3 (three) times daily.  Marland Kitchen leflunomide (ARAVA) 20 MG tablet Take 20 mg by mouth daily.  Marland Kitchen levothyroxine (SYNTHROID) 200 MCG tablet Take 1 tablet (200 mcg total) by mouth daily before breakfast.  . MELATONIN GUMMIES PO Take by mouth at bedtime as needed.  . Multiple Vitamin (MULTIVITAMIN WITH MINERALS) TABS Take 1 tablet by mouth daily.  Marland Kitchen spironolactone (ALDACTONE) 25 MG tablet Take 1 tablet  (25 mg total) by mouth daily.  Marland Kitchen telmisartan (MICARDIS) 40 MG tablet Take 1 tablet (40 mg total) by mouth daily. (Patient taking differently: Take 40 mg by mouth daily. Take 1/2 tablet daily)  . vitamin E 400 UNIT capsule Take 400 Units by mouth daily.   . [DISCONTINUED] modafinil (PROVIGIL) 100 MG tablet Take 100 mg by mouth daily.  . [DISCONTINUED] potassium chloride (MICRO-K) 10 MEQ CR capsule Take 1 capsule (10 mEq total) by mouth 2 (two) times daily.   No facility-administered encounter medications on file as of 06/20/2020.    Current Diagnosis: Patient Active Problem List   Diagnosis Date Noted  . Nocturia more than twice per night 10/25/2017  . Insomnia secondary to chronic pain 10/25/2017  . Upper back pain on left side 04/20/2017  . IDA (iron deficiency anemia) 04/06/2016  . Erythropoietin deficiency anemia 02/23/2016  . GERD (gastroesophageal reflux disease) 06/25/2015  . Hyperlipidemia 01/13/2015  . Hyperglycemia 01/13/2015  . Rheumatoid factor positive 02/20/2014  . Thoracic radiculopathy 10/30/2013  . Circadian rhythm sleep disorder, shift work type 11/03/2012  . Hematuria 08/05/2012  . MGUS (monoclonal gammopathy of unknown significance) 08/03/2012  . Lumbar stenosis with neurogenic claudication 02/29/2012  . Edema 09/13/2011  . Chronic kidney disease, stage IV (severe) ([place]) 12/19/2009  . HERNIATED LUMBAR DISC 12/19/2009  . PULMONARY SARCOIDOSIS 10/16/2009  . Hypothyroidism 10/16/2009  . Anemia associated with stage 2 chronic renal failure 10/16/2009  . CARPAL TUNNEL SYNDROME,  BILATERAL 10/16/2009  . UVEITIS 10/16/2009  . Essential hypertension 10/16/2009  . ARTHRITIS 10/16/2009  . CERVICAL DISC DISORDER 10/16/2009    Goals Addressed   None     Follow-Up:  Pharmacist Review    Called and left a detailed  message with Dr. Annette Stable CMA [place], to send refill for cyclobenzaprine 10 mg to [facility].    Wendy Poet, [place] [phone]

## 2020-06-25 ENCOUNTER — Other Ambulatory Visit: Payer: Self-pay

## 2020-06-25 MED ORDER — SPIRONOLACTONE 25 MG PO TABS: 25.0000 mg | ORAL_TABLET | Freq: Every day | ORAL | 1 refills | Status: DC

## 2020-06-25 MED ORDER — FUROSEMIDE 20 MG PO TABS: 20.0000 mg | ORAL_TABLET | Freq: Every day | ORAL | 0 refills | Status: DC

## 2020-06-25 NOTE — Telephone Encounter (Signed)
Received message from [facility] that Lumigan is too expensive for patient. It is $247 for 1 month or $464 for 3 months due to $480 deductible.  Per [place] is Tier 3. Latanoprost is the preferred product in the class and will be affordable for the patient. Contacted patient's eye doctor Dr Marylouise Stacks office and left message to request switching to latanoprost for cost savings.   12 minutes spent in review, coordination, and documentation.  [place], [facility] 06/25/20 4:18 PM

## 2020-06-26 DIAGNOSIS — Z79899 Other long term (current) drug therapy: Secondary | ICD-10-CM | POA: Diagnosis not present

## 2020-06-26 DIAGNOSIS — M0589 Other rheumatoid arthritis with rheumatoid factor of multiple sites: Secondary | ICD-10-CM | POA: Diagnosis not present

## 2020-07-02 DIAGNOSIS — N1832 Chronic kidney disease, stage 3b: Secondary | ICD-10-CM | POA: Diagnosis not present

## 2020-07-02 DIAGNOSIS — D631 Anemia in chronic kidney disease: Secondary | ICD-10-CM | POA: Diagnosis not present

## 2020-07-02 DIAGNOSIS — R7989 Other specified abnormal findings of blood chemistry: Secondary | ICD-10-CM | POA: Diagnosis not present

## 2020-07-02 DIAGNOSIS — I129 Hypertensive chronic kidney disease with stage 1 through stage 4 chronic kidney disease, or unspecified chronic kidney disease: Secondary | ICD-10-CM | POA: Diagnosis not present

## 2020-07-02 DIAGNOSIS — D472 Monoclonal gammopathy: Secondary | ICD-10-CM | POA: Diagnosis not present

## 2020-07-02 DIAGNOSIS — M069 Rheumatoid arthritis, unspecified: Secondary | ICD-10-CM | POA: Diagnosis not present

## 2020-07-15 ENCOUNTER — Telehealth: Payer: Medicare Other

## 2020-07-15 NOTE — Progress Notes (Deleted)
Chronic Care Management Pharmacy Note  07/15/2020 Name:  Tina Molina MRN:  [id number] DOB:  18-Apr-1946  Subjective: Tina Molina is an 75 y.o. year old female who is a primary patient of Burns, Claudina Lick, MD.  The CCM team was consulted for assistance with disease management and care coordination needs.    {CCMTELEPHONEFACETOFACE:21091510} for {CCMINITIALFOLLOWUPCHOICE:21091511} in response to provider referral for pharmacy case management and/or care coordination services.   Consent to Services:  {CCMCONSENTOPTIONS:25074}  Patient Care Team: Binnie Rail, MD as PCP - General (Internal Medicine) Charlton Haws, [facility] as Pharmacist (Pharmacist) Camillo Flaming, OD as Referring Physician (Optometry)  Recent office visits: 02/15/20 Dr Quay Burow OV: chronic F/U. TSH elevated, increased levothyroxine to 200 mcg daily.   Recent consult visits: 04/03/20 NP Laverna Peace (heme/onc): f/u anemia. Aranesp injection  01/07/20 Dr Arty Baumgartner (nephrology): f/u for CKD stage IV.  12/03/19 Dr Peter Garter (optometry): f/u for ocular HTN.  Hospital visits: None in previous 6 months  Objective:  Lab Results  Component Value Date   CREATININE 2.34 (H) 05/15/2020   BUN 42 (H) 05/15/2020   GFR 17.00 (L) 02/12/2019   GFRNONAA 21 (L) 05/15/2020   GFRAA 29 (L) 01/11/2020   NA 144 05/15/2020   K 4.7 05/15/2020   CALCIUM 9.8 05/15/2020   CO2 27 05/15/2020    Lab Results  Component Value Date/Time   HGBA1C 4.5 (L) 08/14/2019 01:47 PM   HGBA1C 4.7 07/17/2018 02:44 PM   GFR 17.00 (L) 02/12/2019 11:30 AM   GFR 28.30 (L) 07/17/2018 02:44 PM   MICROALBUR 0.50 12/15/2012 04:31 PM    Last diabetic Eye exam: No results found for: HMDIABEYEEXA  Last diabetic Foot exam: No results found for: HMDIABFOOTEX   Lab Results  Component Value Date   CHOL 167 08/14/2019   HDL 55.70 08/14/2019   LDLCALC 88 08/14/2019   LDLDIRECT 156.5 05/27/2011   TRIG 116.0 08/14/2019   CHOLHDL 3 08/14/2019     Hepatic Function Latest Ref Rng & Units 05/15/2020 04/03/2020 02/21/2020  Total Protein 6.5 - 8.1 g/dL 7.4 7.5 7.1  Albumin 3.5 - 5.0 g/dL 4.3 4.3 4.4  AST 15 - 41 U/L $Remo'18 20 16  'ljxwd$ ALT 0 - 44 U/L $Remo'15 22 11  'eBXKS$ Alk Phosphatase 38 - 126 U/L 59 65 61  Total Bilirubin 0.3 - 1.2 mg/dL 0.4 0.3 0.4  Bilirubin, Direct 0.0 - 0.3 mg/dL - - -    Lab Results  Component Value Date/Time   TSH 8.56 (H) 02/15/2020 01:36 PM   TSH 4.64 (H) 10/17/2019 01:54 PM    CBC Latest Ref Rng & Units 05/15/2020 04/03/2020 02/21/2020  WBC 4.0 - 10.5 K/uL 4.3 5.0 4.0  Hemoglobin 12.0 - 15.0 g/dL 11.3(L) 10.7(L) 10.3(L)  Hematocrit 36.0 - 46.0 % 34.9(L) 33.3(L) 32.3(L)  Platelets 150 - 400 K/uL 144(L) 142(L) 145(L)    No results found for: VD25OH  Clinical ASCVD: No  The 10-year ASCVD risk score Mikey Bussing DC Jr., et al., 2013) is: 11.2%   Values used to calculate the score:     Age: 68 years     Sex: Female     Is Non-Hispanic African American: Yes     Diabetic: No     Tobacco smoker: No     Systolic Blood Pressure: 706 mmHg     Is BP treated: Yes     HDL Cholesterol: 55.7 mg/dL     Total Cholesterol: 167 mg/dL    Depression screen [facility] 2/9 05/13/2020 02/15/2020  02/12/2019  Decreased Interest 0 0 0  Down, Depressed, Hopeless 0 0 0  PHQ - 2 Score 0 0 0  Altered sleeping - - -  Tired, decreased energy - - -  Change in appetite - - -  Feeling bad or failure about yourself  - - -  Trouble concentrating - - -  Moving slowly or fidgety/restless - - -  PHQ-9 Score - - -  Some recent data might be hidden     Social History   Tobacco Use  Smoking Status Former Smoker  . Packs/day: 0.50  . Years: 20.00  . Pack years: 10.00  . Types: Cigarettes  . Quit date: 05/17/1997  . Years since quitting: 23.1  Smokeless Tobacco Never Used   BP Readings from Last 3 Encounters:  05/15/20 119/60  04/03/20 (!) (P) 146/79  02/21/20 127/62   Pulse Readings from Last 3 Encounters:  05/15/20 84  04/03/20 (P) 82   02/21/20 85   Wt Readings from Last 3 Encounters:  05/15/20 203 lb 12.8 oz (92.4 kg)  04/03/20 (P) 203 lb 1.9 oz (92.1 kg)  02/21/20 206 lb (93.4 kg)    Assessment/Interventions: Review of patient past medical history, allergies, medications, health status, including review of consultants reports, laboratory and other test data, was performed as part of comprehensive evaluation and provision of chronic care management services.   SDOH:  (Social Determinants of Health) assessments and interventions performed: {yes/no:20286}  Patient lives alone. She recently transferred her medications from [facility] to [facility] so she could use [facility] for leflunomide and Lumigan.  CCM Care Plan  Allergies  Allergen Reactions  . Penicillins Other (See Comments)    Intolerance or allergy not remembered; her sister stated penicillin caused "boils under the skin"  . Shellfish Allergy Rash  . Shellfish-Derived Products Anaphylaxis  . Codeine Nausea Only    Nausea only with liquid codeine  . Infliximab Other (See Comments)     Confusion (intolerance) Knots under the skin  . Sulfonamide Derivatives Rash    Joint pain  . Azithromycin Itching    Medications Reviewed Today    Reviewed by Hebert Soho, RN (Registered Nurse) on 05/15/20 at 2  Med List Status: <None>  Medication Order Taking? Sig Documenting Provider Last Dose Status Informant  aspirin 81 MG tablet [id number] Yes Take 81 mg by mouth daily. [provider] Taking Active Self           Med Note Billee Cashing, SUSAN M   Thu Aug 03, 2012  3:30 PM)                                               atorvastatin (LIPITOR) 20 MG tablet [id number] Yes TAKE 1 TABLET(20 MG) BY MOUTH DAILY Burns, Claudina Lick, MD Taking Active   bimatoprost (LUMIGAN) 0.03 % ophthalmic solution [id number] Yes 1 drop at bedtime. [provider] Taking Active   budesonide-formoterol (SYMBICORT) 160-4.5 MCG/ACT inhaler [id number] Yes inhale 2 puffs if needed  for wheezing Burns, Claudina Lick, MD Taking Active   carvedilol (COREG) 25 MG tablet [id number] Yes TAKE 1 TABLET(25 MG) BY MOUTH TWICE DAILY Burns, Claudina Lick, MD Taking Active   cyclobenzaprine (FLEXERIL) 10 MG tablet [id number] Yes Take 10 mg by mouth 3 (three) times daily as needed for muscle spasms. [provider] Taking Active   famotidine (PEPCID)  40 MG tablet [id number] Yes TAKE 1 TABLET(40 MG) BY MOUTH TWICE DAILY Burns, Claudina Lick, MD Taking Active   furosemide (LASIX) 20 MG tablet [id number] Yes Take 1 tablet (20 mg total) by mouth daily. Binnie Rail, MD Taking Active   gabapentin (NEURONTIN) 300 MG capsule [id number] Yes TAKE 1 CAPSULE(300 MG) BY MOUTH DAILY Burns, Claudina Lick, MD Taking Active   hydrALAZINE (APRESOLINE) 50 MG tablet [id number] Yes Take 1 tablet (50 mg total) by mouth 3 (three) times daily. Binnie Rail, MD Taking Active   leflunomide (ARAVA) 20 MG tablet [id number] Yes Take 20 mg by mouth daily. [provider] Taking Active Self  levothyroxine (SYNTHROID) 200 MCG tablet [id number] Yes Take 1 tablet (200 mcg total) by mouth daily before breakfast. Binnie Rail, MD Taking Active   MELATONIN GUMMIES PO [id number] Yes Take by mouth at bedtime as needed. [provider] Taking Active Self        Discontinued 08/11/11 1501 (Patient has not taken in last 30 days)   Multiple Vitamin (MULTIVITAMIN WITH MINERALS) TABS [id number] Yes Take 1 tablet by mouth daily. [provider] Taking Active Self        Discontinued 07/26/11 [phone] (Reorder)   spironolactone (ALDACTONE) 25 MG tablet [id number] Yes Take 1 tablet (25 mg total) by mouth daily. Binnie Rail, MD Taking Active   telmisartan (MICARDIS) 40 MG tablet [id number] Yes Take 1 tablet (40 mg total) by mouth daily.  Patient taking differently: Take 40 mg by mouth daily. Take 1/2 tablet daily   Binnie Rail, MD Taking Active Self  vitamin E 400 UNIT capsule [id number] Yes Take 400 Units by mouth daily.  [provider] Taking Active Self           Med Note Billee Cashing, Osborne [place]co Jan 03, 2013  2:56 PM)                                                                                   Patient Active Problem List   Diagnosis Date Noted  . Nocturia more than twice per night 10/25/2017  . Insomnia secondary to chronic pain 10/25/2017  . Upper back pain on left side 04/20/2017  . IDA (iron deficiency anemia) 04/06/2016  . Erythropoietin deficiency anemia 02/23/2016  . GERD (gastroesophageal reflux disease) 06/25/2015  . Hyperlipidemia 01/13/2015  . Hyperglycemia 01/13/2015  . Rheumatoid factor positive 02/20/2014  . Thoracic radiculopathy 10/30/2013  . Circadian rhythm sleep disorder, shift work type 11/03/2012  . Hematuria 08/05/2012  . MGUS (monoclonal gammopathy of unknown significance) 08/03/2012  . Lumbar stenosis with neurogenic claudication 02/29/2012  . Edema 09/13/2011  . Chronic kidney disease, stage IV (severe) ([place]) 12/19/2009  . HERNIATED LUMBAR DISC 12/19/2009  . PULMONARY SARCOIDOSIS 10/16/2009  . Hypothyroidism 10/16/2009  . Anemia associated with stage 2 chronic renal failure 10/16/2009  . CARPAL TUNNEL SYNDROME, BILATERAL 10/16/2009  . UVEITIS 10/16/2009  . Essential hypertension 10/16/2009  . ARTHRITIS 10/16/2009  . CERVICAL DISC DISORDER 10/16/2009    Immunization History  Administered Date(s) Administered  . Fluad Quad(high Dose 65+) 02/12/2019, 02/15/2020  . Influenza Whole 01/20/2010  . Influenza, High Dose  Seasonal PF 03/01/2013, 03/13/2015, 04/20/2017, 03/30/2018  . Influenza,inj,Quad PF,6+ Mos 04/05/2016  . PFIZER(Purple Top)SARS-COV-2 Vaccination 07/26/2019, 08/16/2019  . Pneumococcal Conjugate-13 01/13/2015  . Pneumococcal Polysaccharide-23 05/30/2013  . Td 06/01/2009    Conditions to be addressed/monitored:  {USCCMDZASSESSMENTOPTIONS:23563}  There are no care plans that you recently modified to display for this patient.    Medication  Assistance: {MEDASSISTANCEINFO:25044}  Patient's preferred pharmacy is:  [facility] - [place], [place] - [address] [place] [address] Phone: [phone] Fax: [phone]  Uses pill box? No - prefers bottles Pt endorses ***% compliance  We discussed: Previously patient was not using preferred pharmacy.  Reviewed patient's [facility] medication and Epic medication profile assuring there are no discrepancies or gaps in therapy. Confirmed all fill dates appropriate and verified with patient that there is a sufficient quantity of all prescribed medications at home. Informed patient to call me any time if needing medications before scheduled deliveries. The anticipated medication sync date is ***.  Patient decided to: Utilize [facility] for medication synchronization, packaging and delivery  Care Plan and Follow Up Patient Decision:  Patient agrees to Care Plan and Follow-up.  Plan: Telephone follow up appointment with care management team member scheduled for:  ***  Charlene Brooke, PharmD, BCACP Clinical Pharmacist [facility] [phone]     Current Barriers:  . {pharmacybarriers:24917} . ***  Pharmacist Clinical Goal(s):  Marland Kitchen Over the next *** days, patient will {PHARMACYGOALCHOICES:24921} through collaboration with PharmD and provider.  . ***  Interventions: . 1:1 collaboration with Binnie Rail, MD regarding development and update of comprehensive plan of care as evidenced by provider attestation and co-signature . Inter-disciplinary care team collaboration (see longitudinal plan of care) . Comprehensive medication review performed; medication list updated in electronic medical record  Hypertension (BP goal < 130/80) Controlled -home SBP 120s-140 Current regimen:  ? Carvedilol 25 mg twice a day ? Hydralazine 50 mg 3 times a day ? Spironolactone 50 mg - 1/2 tab  daily ? Telmisartan 80 mg daily - 1/2 tab daily ? Furosemide 20 mg daily Interventions: ? Discussed BP goals and benefits of medications for prevention of heart attack / stroke ? Recommend to change telmisartan Rx to 40 mg and spironolactone Rx to 25 mg to avoid splitting pills  Hyperlipidemia (LDL goal < 100) Controlled  - LDL at goal Current regimen:  ? Atorvastatin 20 mg daily Interventions: ? Discussed cholesterol goals and benefits of medications for prevention of heart attack / stroke  Chronic pain *** Current regimen:  ? Leflunomide 20 mg daily  ? Gabapentin 300 mg daily (taking 2-3 times daily) ? Cyclobenzaprine 10 mg 3 times daily as needed Interventions: ? Discussed gabapentin can cause oversedation; advised to reduce dose to once daily as prescribed  Hypothyroidism *** Current regimen:  ? Levothyroxine 200 mcg daily Interventions: ? Discussed extra 1/2 is unnecessary since PCP increased dosage; advised to just take one tablet daily  Pulmonary sarcoidosis *** Current regimen:  o Symbicort 160-4.5 mg 2 puffs PRN Interventions: o ***  Glaucoma *** Current regimen:  o Latanoprost 0.005% eye drops Interventions: o ***  Patient Goals/Self-Care Activities . Over the next *** days, patient will:  - {pharmacypatientgoals:24919}  Follow Up Plan: {CM FOLLOW UP XBWI:20355}

## 2020-07-16 ENCOUNTER — Telehealth: Payer: Self-pay | Admitting: Pharmacist

## 2020-07-16 NOTE — Progress Notes (Addendum)
Chronic Care Management Pharmacy Assistant   Name: Tina Molina  MRN: [id number] DOB: 07-02-1945  Reason for Encounter: Medication Review   PCP : Binnie Rail, MD  Allergies:   Allergies  Allergen Reactions   Penicillins Other (See Comments)    Intolerance or allergy not remembered; her sister stated penicillin caused "boils under the skin"   Shellfish Allergy Rash   Shellfish-Derived Products Anaphylaxis   Codeine Nausea Only    Nausea only with liquid codeine   Infliximab Other (See Comments)     Confusion (intolerance) Knots under the skin   Sulfonamide Derivatives Rash    Joint pain   Azithromycin Itching    Medications: Outpatient Encounter Medications as of 07/16/2020  Medication Sig   aspirin 81 MG tablet Take 81 mg by mouth daily.   atorvastatin (LIPITOR) 20 MG tablet TAKE 1 TABLET(20 MG) BY MOUTH DAILY   bimatoprost (LUMIGAN) 0.03 % ophthalmic solution 1 drop at bedtime.   budesonide-formoterol (SYMBICORT) 160-4.5 MCG/ACT inhaler inhale 2 puffs if needed for wheezing   carvedilol (COREG) 25 MG tablet TAKE 1 TABLET(25 MG) BY MOUTH TWICE DAILY   cyclobenzaprine (FLEXERIL) 10 MG tablet Take 10 mg by mouth 3 (three) times daily as needed for muscle spasms.   famotidine (PEPCID) 40 MG tablet TAKE 1 TABLET(40 MG) BY MOUTH TWICE DAILY   furosemide (LASIX) 20 MG tablet Take 1 tablet (20 mg total) by mouth daily.   gabapentin (NEURONTIN) 300 MG capsule TAKE 1 CAPSULE(300 MG) BY MOUTH DAILY   hydrALAZINE (APRESOLINE) 50 MG tablet Take 1 tablet (50 mg total) by mouth 3 (three) times daily.   leflunomide (ARAVA) 20 MG tablet Take 20 mg by mouth daily.   levothyroxine (SYNTHROID) 200 MCG tablet Take 1 tablet (200 mcg total) by mouth daily before breakfast.   MELATONIN GUMMIES PO Take by mouth at bedtime as needed.   Multiple Vitamin (MULTIVITAMIN WITH MINERALS) TABS Take 1 tablet by mouth daily.   spironolactone (ALDACTONE) 25 MG tablet Take 1 tablet (25 mg total) by  mouth daily.   spironolactone (ALDACTONE) 50 MG tablet TAKE 1/2 TABLET BY MOUTH ONCE DAILY   telmisartan (MICARDIS) 40 MG tablet Take 1 tablet (40 mg total) by mouth daily. (Patient taking differently: Take 40 mg by mouth daily. Take 1/2 tablet daily)   vitamin E 400 UNIT capsule Take 400 Units by mouth daily.    [DISCONTINUED] modafinil (PROVIGIL) 100 MG tablet Take 100 mg by mouth daily.   [DISCONTINUED] potassium chloride (MICRO-K) 10 MEQ CR capsule Take 1 capsule (10 mEq total) by mouth 2 (two) times daily.   No facility-administered encounter medications on file as of 07/16/2020.    Current Diagnosis: Patient Active Problem List   Diagnosis Date Noted   Nocturia more than twice per night 10/25/2017   Insomnia secondary to chronic pain 10/25/2017   Upper back pain on left side 04/20/2017   IDA (iron deficiency anemia) 04/06/2016   Erythropoietin deficiency anemia 02/23/2016   GERD (gastroesophageal reflux disease) 06/25/2015   Hyperlipidemia 01/13/2015   Hyperglycemia 01/13/2015   Rheumatoid factor positive 02/20/2014   Thoracic radiculopathy 10/30/2013   Circadian rhythm sleep disorder, shift work type 11/03/2012   Hematuria 08/05/2012   MGUS (monoclonal gammopathy of unknown significance) 08/03/2012   Lumbar stenosis with neurogenic claudication 02/29/2012   Edema 09/13/2011   Chronic kidney disease, stage IV (severe) ([place]) 12/19/2009   HERNIATED LUMBAR DISC 12/19/2009   PULMONARY SARCOIDOSIS 10/16/2009   Hypothyroidism 10/16/2009  Anemia associated with stage 2 chronic renal failure 10/16/2009   CARPAL TUNNEL SYNDROME, BILATERAL 10/16/2009   UVEITIS 10/16/2009   Essential hypertension 10/16/2009   ARTHRITIS 10/16/2009   CERVICAL [place] DISORDER 10/16/2009    Reviewed chart for medication changes ahead of medication coordination call.  No OVs, Consults, or hospital visits since last care coordination call/Pharmacist visit. (If appropriate, list visit date, provider  name)  No medication changes indicated OR if recent visit, treatment plan here.  BP Readings from Last 3 Encounters:  05/15/20 119/60  04/03/20 (!) (P) 146/79  02/21/20 127/62    Lab Results  Component Value Date   HGBA1C 4.5 (L) 08/14/2019     Patient obtains medications through Vials  30 Days   Last adherence delivery included: Famotidine 40mg   Take one tab twice daily Telmisartan 40mg   Take one tab once daily Leflunomide 20mg   Take one tab once daily   Patient is due for next adherence delivery on: 07-24-20. Called patient and reviewed medications and coordinated delivery.  This delivery to include: Latanoprost 0.005% eye drops Instill one drop in each eye daily at bedtime Gabapentin 300mg  Cap Take one capsule by mouth once daily Hydralazine 50mg  Tab Take one tablet by mouth three times daily   Confirmed delivery date of 07-24-20, advised patient that pharmacy will contact them the morning of delivery.  Georgiana Shore ,[facility] Pharmacist Assistant [phone]  Follow-Up:  Pharmacist Review   Time Spent: 98 Minutes

## 2020-07-17 ENCOUNTER — Inpatient Hospital Stay: Payer: Medicare Other

## 2020-07-17 ENCOUNTER — Inpatient Hospital Stay: Payer: Medicare Other | Attending: Hematology & Oncology

## 2020-07-17 ENCOUNTER — Inpatient Hospital Stay (HOSPITAL_BASED_OUTPATIENT_CLINIC_OR_DEPARTMENT_OTHER): Payer: Medicare Other | Admitting: Family

## 2020-07-17 ENCOUNTER — Other Ambulatory Visit: Payer: Self-pay

## 2020-07-17 ENCOUNTER — Telehealth: Payer: Self-pay

## 2020-07-17 DIAGNOSIS — Z7982 Long term (current) use of aspirin: Secondary | ICD-10-CM | POA: Diagnosis not present

## 2020-07-17 DIAGNOSIS — E611 Iron deficiency: Secondary | ICD-10-CM | POA: Insufficient documentation

## 2020-07-17 DIAGNOSIS — D472 Monoclonal gammopathy: Secondary | ICD-10-CM | POA: Insufficient documentation

## 2020-07-17 DIAGNOSIS — D509 Iron deficiency anemia, unspecified: Secondary | ICD-10-CM | POA: Diagnosis not present

## 2020-07-17 DIAGNOSIS — N182 Chronic kidney disease, stage 2 (mild): Secondary | ICD-10-CM | POA: Insufficient documentation

## 2020-07-17 DIAGNOSIS — D508 Other iron deficiency anemias: Secondary | ICD-10-CM

## 2020-07-17 DIAGNOSIS — D631 Anemia in chronic kidney disease: Secondary | ICD-10-CM | POA: Diagnosis not present

## 2020-07-17 DIAGNOSIS — R0602 Shortness of breath: Secondary | ICD-10-CM | POA: Insufficient documentation

## 2020-07-17 DIAGNOSIS — R5383 Other fatigue: Secondary | ICD-10-CM | POA: Diagnosis not present

## 2020-07-17 DIAGNOSIS — Z79899 Other long term (current) drug therapy: Secondary | ICD-10-CM | POA: Diagnosis not present

## 2020-07-17 LAB — RETICULOCYTES
Immature Retic Fract: 17.3 % — ABNORMAL HIGH (ref 2.3–15.9)
RBC.: 2.83 MIL/uL — ABNORMAL LOW (ref 3.87–5.11)
Retic Count, Absolute: 63.4 10*3/uL (ref 19.0–186.0)
Retic Ct Pct: 2.2 % (ref 0.4–3.1)

## 2020-07-17 LAB — CBC WITH DIFFERENTIAL (CANCER CENTER ONLY)
Abs Immature Granulocytes: 0.02 10*3/uL (ref 0.00–0.07)
Basophils Absolute: 0.1 10*3/uL (ref 0.0–0.1)
Basophils Relative: 1 %
Eosinophils Absolute: 0.2 10*3/uL (ref 0.0–0.5)
Eosinophils Relative: 4 %
HCT: 28 % — ABNORMAL LOW (ref 36.0–46.0)
Hemoglobin: 9 g/dL — ABNORMAL LOW (ref 12.0–15.0)
Immature Granulocytes: 0 %
Lymphocytes Relative: 58 %
Lymphs Abs: 2.7 10*3/uL (ref 0.7–4.0)
MCH: 31.9 pg (ref 26.0–34.0)
MCHC: 32.1 g/dL (ref 30.0–36.0)
MCV: 99.3 fL (ref 80.0–100.0)
Monocytes Absolute: 0.6 10*3/uL (ref 0.1–1.0)
Monocytes Relative: 13 %
Neutro Abs: 1.1 10*3/uL — ABNORMAL LOW (ref 1.7–7.7)
Neutrophils Relative %: 24 %
Platelet Count: 186 10*3/uL (ref 150–400)
RBC: 2.82 MIL/uL — ABNORMAL LOW (ref 3.87–5.11)
RDW: 14.3 % (ref 11.5–15.5)
WBC Count: 4.6 10*3/uL (ref 4.0–10.5)
nRBC: 0 % (ref 0.0–0.2)

## 2020-07-17 LAB — CMP (CANCER CENTER ONLY)
ALT: 10 U/L (ref 0–44)
AST: 14 U/L — ABNORMAL LOW (ref 15–41)
Albumin: 4.4 g/dL (ref 3.5–5.0)
Alkaline Phosphatase: 63 U/L (ref 38–126)
Anion gap: 11 (ref 5–15)
BUN: 35 mg/dL — ABNORMAL HIGH (ref 8–23)
CO2: 23 mmol/L (ref 22–32)
Calcium: 9.3 mg/dL (ref 8.9–10.3)
Chloride: 108 mmol/L (ref 98–111)
Creatinine: 2.16 mg/dL — ABNORMAL HIGH (ref 0.44–1.00)
GFR, Estimated: 23 mL/min — ABNORMAL LOW (ref >60)
Glucose, Bld: 109 mg/dL — ABNORMAL HIGH (ref 70–99)
Potassium: 3.9 mmol/L (ref 3.5–5.1)
Sodium: 142 mmol/L (ref 135–145)
Total Bilirubin: 0.4 mg/dL (ref 0.3–1.2)
Total Protein: 7.2 g/dL (ref 6.5–8.1)

## 2020-07-17 MED ORDER — DARBEPOETIN ALFA 300 MCG/0.6ML IJ SOSY
PREFILLED_SYRINGE | INTRAMUSCULAR | Status: AC
  Filled 2020-07-17: qty 0.6

## 2020-07-17 MED ORDER — DARBEPOETIN ALFA 300 MCG/0.6ML IJ SOSY
300.0000 ug | PREFILLED_SYRINGE | Freq: Once | INTRAMUSCULAR | Status: AC
  Administered 2020-07-17: 300 ug via SUBCUTANEOUS

## 2020-07-17 NOTE — Progress Notes (Signed)
Hematology and Oncology Follow Up Visit  Tina Molina [id number] 03-26-1946 75 y.o. 07/17/2020   Principle Diagnosis:  Anemia of chronic renal failure - stage II MGUS - IgA lambda Iron deficiency anemia  Current Therapy: Aranesp 300 mcg SQ to keep Hgb > 11 IV iron as indicated   Interim History: Tina Molina is here today for follow-up. She is feeling fatigued and has noted mild SOB and chest tightness with over exertion. She takes a break to rest when needed.  She has had a couple brief episodes of dizziness if she stands too quickly.  No blood loss noted. No bruising or petechiae.  She denies fever, chills, n/v, cough, rash, chest pain, palpitations, abdominal pain or changes in bowel or bladder habits.   No swelling, tenderness, numbness or tingling in her extremities at this time.  No falls or syncope to report.  She has a good appetite and is staying well hydrated. Her weight is stable at 203 lbs.   ECOG Performance Status: 1 - Symptomatic but completely ambulatory  Medications:  Allergies as of 07/17/2020      Reactions   Penicillins Other (See Comments)   Intolerance or allergy not remembered; her sister stated penicillin caused "boils under the skin"   Shellfish Allergy Rash   Shellfish-derived Products Anaphylaxis   Codeine Nausea Only   Nausea only with liquid codeine   Infliximab Other (See Comments)    Confusion (intolerance) Knots under the skin   Sulfonamide Derivatives Rash   Joint pain   Azithromycin Itching      Medication List       Accurate as of July 17, 2020  1:35 PM. If you have any questions, ask your nurse or doctor.        aspirin 81 MG tablet Take 81 mg by mouth daily.   atorvastatin 20 MG tablet Commonly known as: LIPITOR TAKE 1 TABLET(20 MG) BY MOUTH DAILY   bimatoprost 0.03 % ophthalmic solution Commonly known as: LUMIGAN 1 drop at bedtime.   budesonide-formoterol 160-4.5 MCG/ACT inhaler Commonly known as:  Symbicort inhale 2 puffs if needed for wheezing   carvedilol 25 MG tablet Commonly known as: COREG TAKE 1 TABLET(25 MG) BY MOUTH TWICE DAILY   cyclobenzaprine 10 MG tablet Commonly known as: FLEXERIL Take 10 mg by mouth 3 (three) times daily as needed for muscle spasms.   famotidine 40 MG tablet Commonly known as: PEPCID TAKE 1 TABLET(40 MG) BY MOUTH TWICE DAILY   furosemide 20 MG tablet Commonly known as: LASIX Take 1 tablet (20 mg total) by mouth daily.   gabapentin 300 MG capsule Commonly known as: NEURONTIN TAKE 1 CAPSULE(300 MG) BY MOUTH DAILY   hydrALAZINE 50 MG tablet Commonly known as: APRESOLINE Take 1 tablet (50 mg total) by mouth 3 (three) times daily.   leflunomide 20 MG tablet Commonly known as: ARAVA Take 20 mg by mouth daily.   levothyroxine 200 MCG tablet Commonly known as: Synthroid Take 1 tablet (200 mcg total) by mouth daily before breakfast.   MELATONIN GUMMIES PO Take by mouth at bedtime as needed.   multivitamin with minerals Tabs tablet Take 1 tablet by mouth daily.   spironolactone 50 MG tablet Commonly known as: ALDACTONE TAKE 1/2 TABLET BY MOUTH ONCE DAILY   spironolactone 25 MG tablet Commonly known as: Aldactone Take 1 tablet (25 mg total) by mouth daily.   telmisartan 40 MG tablet Commonly known as: MICARDIS Take 1 tablet (40 mg total) by mouth daily. What changed:  additional instructions   vitamin E 180 MG (400 UNITS) capsule Take 400 Units by mouth daily.       Allergies:  Allergies  Allergen Reactions  . Penicillins Other (See Comments)    Intolerance or allergy not remembered; her sister stated penicillin caused "boils under the skin"  . Shellfish Allergy Rash  . Shellfish-Derived Products Anaphylaxis  . Codeine Nausea Only    Nausea only with liquid codeine  . Infliximab Other (See Comments)     Confusion (intolerance) Knots under the skin  . Sulfonamide Derivatives Rash    Joint pain  . Azithromycin Itching     Past Medical History, Surgical history, Social history, and Family History were reviewed and updated.  Review of Systems: All other 10 point review of systems is negative.   Physical Exam:  vitals were not taken for this visit.   Wt Readings from Last 3 Encounters:  05/15/20 203 lb 12.8 oz (92.4 kg)  04/03/20 (P) 203 lb 1.9 oz (92.1 kg)  02/21/20 206 lb (93.4 kg)    Ocular: Sclerae unicteric, pupils equal, round and reactive to light Ear-nose-throat: Oropharynx clear, dentition fair Lymphatic: No cervical or supraclavicular adenopathy Lungs no rales or rhonchi, good excursion bilaterally Heart regular rate and rhythm, no murmur appreciated Abd soft, nontender, positive bowel sounds MSK no focal spinal tenderness, no joint edema Neuro: non-focal, well-oriented, appropriate affect Breasts: Deferred   Lab Results  Component Value Date   WBC 4.6 07/17/2020   HGB 9.0 (L) 07/17/2020   HCT 28.0 (L) 07/17/2020   MCV 99.3 07/17/2020   PLT 186 07/17/2020   Lab Results  Component Value Date   FERRITIN 236 05/15/2020   IRON 118 05/15/2020   TIBC 276 05/15/2020   UIBC 158 05/15/2020   IRONPCTSAT 43 05/15/2020   Lab Results  Component Value Date   RETICCTPCT 2.2 07/17/2020   RBC 2.82 (L) 07/17/2020   RBC 2.83 (L) 07/17/2020   RETICCTABS 51.6 01/03/2013   Lab Results  Component Value Date   KPAFRELGTCHN 25.9 (H) 01/11/2020   LAMBDASER 37.5 (H) 01/11/2020   KAPLAMBRATIO 0.69 01/11/2020   Lab Results  Component Value Date   IGGSERUM 962 01/11/2020   IGA 583 (H) 01/11/2020   IGMSERUM 25 (L) 01/11/2020   Lab Results  Component Value Date   TOTALPROTELP 7.0 01/11/2020   ALBUMINELP 3.9 01/11/2020   A1GS 0.2 01/11/2020   A2GS 0.7 01/11/2020   BETS 1.3 01/11/2020   BETA2SER 10.3 (H) 11/04/2011   GAMS 0.9 01/11/2020   MSPIKE 0.3 (H) 01/11/2020   SPEI Comment 01/11/2020     Chemistry      Component Value Date/Time   NA 142 07/17/2020 1249   NA 150 (H)  03/01/2017 1308   NA 143 04/05/2016 1156   K 3.9 07/17/2020 1249   K 4.0 03/01/2017 1308   K 3.8 04/05/2016 1156   CL 108 07/17/2020 1249   CL 109 (H) 03/01/2017 1308   CO2 23 07/17/2020 1249   CO2 28 03/01/2017 1308   CO2 21 (L) 04/05/2016 1156   BUN 35 (H) 07/17/2020 1249   BUN 19 03/01/2017 1308   BUN 18.5 04/05/2016 1156   CREATININE 2.16 (H) 07/17/2020 1249   CREATININE 1.8 (H) 03/01/2017 1308   CREATININE 1.5 (H) 04/05/2016 1156      Component Value Date/Time   CALCIUM 9.3 07/17/2020 1249   CALCIUM 9.7 03/01/2017 1308   CALCIUM 9.3 04/05/2016 1156   ALKPHOS 63 07/17/2020 1249  ALKPHOS 55 03/01/2017 1308   ALKPHOS 77 04/05/2016 1156   AST 14 (L) 07/17/2020 1249   AST 21 04/05/2016 1156   ALT 10 07/17/2020 1249   ALT 18 03/01/2017 1308   ALT 22 04/05/2016 1156   BILITOT 0.4 07/17/2020 1249   BILITOT 0.36 04/05/2016 1156       Impression and Plan: Ms. Culhane is a very pleasant 71yoAfrican American female with multifactorial anemia as well as an IgA lambda MGUS. ESA given today for Hgb 9.0.  Lab only in 6 weeks with follow-up in 3 months.  She can contact our office with any questions or concerns.   Laverna Peace, NP 3/3/20221:35 PM

## 2020-07-17 NOTE — Telephone Encounter (Signed)
Called and left a vm with the appts per the 07/17/20 los   Chanel Mckesson

## 2020-07-17 NOTE — Patient Instructions (Signed)
Darbepoetin Alfa injection What is this medicine? DARBEPOETIN ALFA (dar be POE e tin AL fa) helps your body make more red blood cells. It is used to treat anemia caused by chronic kidney failure and chemotherapy. This medicine may be used for other purposes; ask your health care provider or pharmacist if you have questions. COMMON BRAND NAME(S): Aranesp What should I tell my health care provider before I take this medicine? They need to know if you have any of these conditions:  blood clotting disorders or history of blood clots  cancer patient not on chemotherapy  cystic fibrosis  heart disease, such as angina, heart failure, or a history of a heart attack  hemoglobin level of 12 g/dL or greater  high blood pressure  low levels of folate, iron, or vitamin B12  seizures  an unusual or allergic reaction to darbepoetin, erythropoietin, albumin, hamster proteins, latex, other medicines, foods, dyes, or preservatives  pregnant or trying to get pregnant  breast-feeding How should I use this medicine? This medicine is for injection into a vein or under the skin. It is usually given by a health care professional in a hospital or clinic setting. If you get this medicine at home, you will be taught how to prepare and give this medicine. Use exactly as directed. Take your medicine at regular intervals. Do not take your medicine more often than directed. It is important that you put your used needles and syringes in a special sharps container. Do not put them in a trash can. If you do not have a sharps container, call your pharmacist or healthcare provider to get one. A special MedGuide will be given to you by the pharmacist with each prescription and refill. Be sure to read this information carefully each time. Talk to your pediatrician regarding the use of this medicine in children. While this medicine may be used in children as young as 1 month of age for selected conditions, precautions do  apply. Overdosage: If you think you have taken too much of this medicine contact a poison control center or emergency room at once. NOTE: This medicine is only for you. Do not share this medicine with others. What if I miss a dose? If you miss a dose, take it as soon as you can. If it is almost time for your next dose, take only that dose. Do not take double or extra doses. What may interact with this medicine? Do not take this medicine with any of the following medications:  epoetin alfa This list may not describe all possible interactions. Give your health care provider a list of all the medicines, herbs, non-prescription drugs, or dietary supplements you use. Also tell them if you smoke, drink alcohol, or use illegal drugs. Some items may interact with your medicine. What should I watch for while using this medicine? Your condition will be monitored carefully while you are receiving this medicine. You may need blood work done while you are taking this medicine. This medicine may cause a decrease in vitamin B6. You should make sure that you get enough vitamin B6 while you are taking this medicine. Discuss the foods you eat and the vitamins you take with your health care professional. What side effects may I notice from receiving this medicine? Side effects that you should report to your doctor or health care professional as soon as possible:  allergic reactions like skin rash, itching or hives, swelling of the face, lips, or tongue  breathing problems  changes in   vision  chest pain  confusion, trouble speaking or understanding  feeling faint or lightheaded, falls  high blood pressure  muscle aches or pains  pain, swelling, warmth in the leg  rapid weight gain  severe headaches  sudden numbness or weakness of the face, arm or leg  trouble walking, dizziness, loss of balance or coordination  seizures (convulsions)  swelling of the ankles, feet, hands  unusually weak or  tired Side effects that usually do not require medical attention (report to your doctor or health care professional if they continue or are bothersome):  diarrhea  fever, chills (flu-like symptoms)  headaches  nausea, vomiting  redness, stinging, or swelling at site where injected This list may not describe all possible side effects. Call your doctor for medical advice about side effects. You may report side effects to FDA at 1-800-FDA-1088. Where should I keep my medicine? Keep out of the reach of children. Store in a refrigerator between 2 and 8 degrees C (36 and 46 degrees F). Do not freeze. Do not shake. Throw away any unused portion if using a single-dose vial. Throw away any unused medicine after the expiration date. NOTE: This sheet is a summary. It may not cover all possible information. If you have questions about this medicine, talk to your doctor, pharmacist, or health care provider.  2021 Elsevier/Gold Standard (2017-05-18 16:44:20)  

## 2020-07-18 LAB — IRON AND TIBC
Iron: 101 ug/dL (ref 41–142)
Saturation Ratios: 34 % (ref 21–57)
TIBC: 301 ug/dL (ref 236–444)
UIBC: 200 ug/dL (ref 120–384)

## 2020-07-18 LAB — KAPPA/LAMBDA LIGHT CHAINS
Kappa free light chain: 25.6 mg/L — ABNORMAL HIGH (ref 3.3–19.4)
Kappa, lambda light chain ratio: 0.63 (ref 0.26–1.65)
Lambda free light chains: 40.5 mg/L — ABNORMAL HIGH (ref 5.7–26.3)

## 2020-07-18 LAB — FERRITIN: Ferritin: 225 ng/mL (ref 11–307)

## 2020-07-18 LAB — IGG, IGA, IGM
IgA: 569 mg/dL — ABNORMAL HIGH (ref 64–422)
IgG (Immunoglobin G), Serum: 957 mg/dL (ref 586–1602)
IgM (Immunoglobulin M), Srm: 21 mg/dL — ABNORMAL LOW (ref 26–217)

## 2020-07-21 LAB — PROTEIN ELECTROPHORESIS, SERUM
A/G Ratio: 1.4 (ref 0.7–1.7)
Albumin ELP: 4 g/dL (ref 2.9–4.4)
Alpha-1-Globulin: 0.2 g/dL (ref 0.0–0.4)
Alpha-2-Globulin: 0.6 g/dL (ref 0.4–1.0)
Beta Globulin: 1.3 g/dL (ref 0.7–1.3)
Gamma Globulin: 0.8 g/dL (ref 0.4–1.8)
Globulin, Total: 2.9 g/dL (ref 2.2–3.9)
M-Spike, %: 0.2 g/dL — ABNORMAL HIGH
Total Protein ELP: 6.9 g/dL (ref 6.0–8.5)

## 2020-08-05 DIAGNOSIS — H40053 Ocular hypertension, bilateral: Secondary | ICD-10-CM | POA: Diagnosis not present

## 2020-08-08 ENCOUNTER — Telehealth: Payer: Self-pay

## 2020-08-08 NOTE — Telephone Encounter (Signed)
R/s pts appts from 6/2 to 6/1 as Judson Roch is out of the office on thursdays   Callie Bunyard

## 2020-08-14 NOTE — Progress Notes (Signed)
Subjective:    Patient ID: Tina Molina, female    DOB: Apr 22, 1946, 75 y.o.   MRN: [id number]  HPI The patient is here for follow up of their chronic medical problems, including htn, hypothyroidism, hyperlipidemia, hyperglycemia, GERD, CKD   She is not exercising - she is too tired and her back feels weak when she walks.     Medications and allergies reviewed with patient and updated if appropriate.  Patient Active Problem List   Diagnosis Date Noted  . Nocturia more than twice per night 10/25/2017  . Insomnia secondary to chronic pain 10/25/2017  . Upper back pain on left side 04/20/2017  . IDA (iron deficiency anemia) 04/06/2016  . Erythropoietin deficiency anemia 02/23/2016  . GERD (gastroesophageal reflux disease) 06/25/2015  . Hyperlipidemia 01/13/2015  . Hyperglycemia 01/13/2015  . Rheumatoid factor positive 02/20/2014  . Thoracic radiculopathy 10/30/2013  . Circadian rhythm sleep disorder, shift work type 11/03/2012  . Hematuria 08/05/2012  . MGUS (monoclonal gammopathy of unknown significance) 08/03/2012  . Lumbar stenosis with neurogenic claudication 02/29/2012  . Edema 09/13/2011  . Chronic kidney disease, stage IV (severe) ([place]) 12/19/2009  . HERNIATED LUMBAR DISC 12/19/2009  . PULMONARY SARCOIDOSIS 10/16/2009  . Hypothyroidism 10/16/2009  . Anemia associated with stage 2 chronic renal failure 10/16/2009  . CARPAL TUNNEL SYNDROME, BILATERAL 10/16/2009  . UVEITIS 10/16/2009  . Essential hypertension 10/16/2009  . ARTHRITIS 10/16/2009  . CERVICAL DISC DISORDER 10/16/2009    Current Outpatient Medications on File Prior to Visit  Medication Sig Dispense Refill  . aspirin 81 MG tablet Take 81 mg by mouth daily.    Marland Kitchen atorvastatin (LIPITOR) 20 MG tablet TAKE 1 TABLET(20 MG) BY MOUTH DAILY 90 tablet 1  . bimatoprost (LUMIGAN) 0.03 % ophthalmic solution 1 drop at bedtime.    . budesonide-formoterol (SYMBICORT) 160-4.5 MCG/ACT inhaler inhale 2 puffs if needed  for wheezing 10.2 g 0  . carvedilol (COREG) 25 MG tablet TAKE 1 TABLET(25 MG) BY MOUTH TWICE DAILY 180 tablet 1  . cyclobenzaprine (FLEXERIL) 10 MG tablet Take 10 mg by mouth 3 (three) times daily as needed for muscle spasms.    . famotidine (PEPCID) 40 MG tablet TAKE 1 TABLET(40 MG) BY MOUTH TWICE DAILY 180 tablet 1  . furosemide (LASIX) 20 MG tablet Take 1 tablet (20 mg total) by mouth daily. 90 tablet 0  . gabapentin (NEURONTIN) 300 MG capsule TAKE 1 CAPSULE(300 MG) BY MOUTH DAILY 90 capsule 1  . hydrALAZINE (APRESOLINE) 50 MG tablet Take 1 tablet (50 mg total) by mouth 3 (three) times daily. 270 tablet 0  . latanoprost (XALATAN) 0.005 % ophthalmic solution 1 drop at bedtime.    Marland Kitchen leflunomide (ARAVA) 20 MG tablet Take 20 mg by mouth daily.    Marland Kitchen levothyroxine (SYNTHROID) 200 MCG tablet Take 1 tablet (200 mcg total) by mouth daily before breakfast. 90 tablet 1  . MELATONIN GUMMIES PO Take by mouth at bedtime as needed.    . Multiple Vitamin (MULTIVITAMIN WITH MINERALS) TABS Take 1 tablet by mouth daily.    Marland Kitchen spironolactone (ALDACTONE) 25 MG tablet Take 1 tablet (25 mg total) by mouth daily. 90 tablet 1  . telmisartan (MICARDIS) 40 MG tablet Take 1 tablet (40 mg total) by mouth daily. (Patient taking differently: Take 40 mg by mouth daily. Take 1/2 tablet daily) 90 tablet 1  . vitamin E 400 UNIT capsule Take 400 Units by mouth daily.     Marland Kitchen LUMIGAN 0.01 % SOLN SMARTSIG:In Eye(s)    . [  DISCONTINUED] modafinil (PROVIGIL) 100 MG tablet Take 100 mg by mouth daily.    . [DISCONTINUED] potassium chloride (MICRO-K) 10 MEQ CR capsule Take 1 capsule (10 mEq total) by mouth 2 (two) times daily. 180 capsule 1   No current facility-administered medications on file prior to visit.    Past Medical History:  Diagnosis Date  . Anemia    Associated with leukopenia and lymphocytosis. This is been evaluated by Dr. Marin Olp . Her platelet count has been normal  . Arthritis   . Chronic back pain   . Chronic  kidney disease   . Erythropoietin deficiency anemia 02/23/2016  . GERD (gastroesophageal reflux disease)   . Hypertension   . Lymphocytosis   . MGUS (monoclonal gammopathy of unknown significance) 08/03/2012   IgA 792 11/04/11  . Pneumonia 04/2010   OP  . Renal insufficiency   . Sarcoidosis   . Thyroid disease     Past Surgical History:  Procedure Laterality Date  . CARPAL TUNNEL RELEASE     Bilateral   . CATARACT EXTRACTION Right 05/2015  . CATARACT EXTRACTION Left   . CERVICAL DISCECTOMY  06/2010   Dr.Pool  . CHOLECYSTECTOMY    . COLONOSCOPY  2010  . POSTERIOR LUMBAR FUSION  02/29/2012   Dr Alyson Locket op respiratory & renal compromise  . THYROIDECTOMY  2003   For goiter  . UPPER GASTROINTESTINAL ENDOSCOPY  12/01/2011   DB    Social History   Socioeconomic History  . Marital status: Divorced    Spouse name: Not on file  . Number of children: 0  . Years of education: 40  . Highest education level: Not on file  Occupational History  . Occupation: Retired    Fish farm manager: RETIRED  Tobacco Use  . Smoking status: Former Smoker    Packs/day: 0.50    Years: 20.00    Pack years: 10.00    Types: Cigarettes    Quit date: 05/17/1997    Years since quitting: 23.2  . Smokeless tobacco: Never Used  Vaping Use  . Vaping Use: Never used  Substance and Sexual Activity  . Alcohol use: Yes    Alcohol/week: 1.0 standard drink    Types: 1 Glasses of wine per week    Comment: occasionally , less than weekly  . Drug use: No  . Sexual activity: Not Currently  Other Topics Concern  . Not on file  Social History Narrative   Patient is divorced and lives with her sister. Patient is retired.   Caffeine- sometimes - one cup   Right handed.   Social Determinants of Health   Financial Resource Strain: Medium Risk  . Difficulty of Paying Living Expenses: Somewhat hard  Food Insecurity: Not on file  Transportation Needs: Not on file  Physical Activity: Not on file  Stress: Not on file   Social Connections: Not on file    Family History  Problem Relation Age of Onset  . Anemia Father   . Arthritis Father   . Heart failure Father 9       No CAD  . Diabetes Mother   . Arthritis Mother   . Glaucoma Mother   . Heart disease Mother 6       No CAD  . Hypertension Brother   . Anemia Brother   . Kidney disease Brother   . Hypertension Sister   . Kidney disease Sister   . Anemia Sister   . Diabetes Maternal Aunt   . Arthritis Paternal Grandmother   .  Colon cancer Neg Hx   . Colon polyps Neg Hx   . Esophageal cancer Neg Hx   . Rectal cancer Neg Hx   . Stomach cancer Neg Hx     Review of Systems  Constitutional: Negative for chills and fever.  Respiratory: Positive for wheezing (occ at night). Negative for cough and shortness of breath.   Cardiovascular: Positive for palpitations (occ). Negative for chest pain and leg swelling.  Musculoskeletal: Positive for back pain.  Neurological: Negative for light-headedness and headaches.       Objective:   Vitals:   08/15/20 1254  BP: 128/70  Pulse: 91  Temp: 98.1 F (36.7 C)  SpO2: 96%   BP Readings from Last 3 Encounters:  08/15/20 128/70  05/15/20 119/60  04/03/20 (!) (P) 146/79   Wt Readings from Last 3 Encounters:  08/15/20 205 lb (93 kg)  05/15/20 203 lb 12.8 oz (92.4 kg)  04/03/20 (P) 203 lb 1.9 oz (92.1 kg)   Body mass index is 35.19 kg/m.   Physical Exam    Constitutional: Appears well-developed and well-nourished. No distress.  HENT:  Head: Normocephalic and atraumatic.  Neck: Neck supple. No tracheal deviation present. No thyromegaly present.  No cervical lymphadenopathy Cardiovascular: Normal rate, regular rhythm and normal heart sounds.   No murmur heard. No carotid bruit .  No edema Pulmonary/Chest: Effort normal and breath sounds normal. No respiratory distress. No has no wheezes. No rales.  Skin: Skin is warm and dry. Not diaphoretic.  Psychiatric: Normal mood and affect. Behavior  is normal.      Assessment & Plan:    See Problem List for Assessment and Plan of chronic medical problems.    This visit occurred during the SARS-CoV-2 public health emergency.  Safety protocols were in place, including screening questions prior to the visit, additional usage of staff PPE, and extensive cleaning of exam room while observing appropriate contact time as indicated for disinfecting solutions.

## 2020-08-14 NOTE — Patient Instructions (Addendum)
Call and schedule your mammogram -  [facility] 7 a.m.-6:30 p.m., Monday 7 a.m.-5 p.m., Tuesday-Friday Schedule an appointment by calling [phone]    Blood work was ordered.      Medications changes include :   none     Please followup in 6 months

## 2020-08-15 ENCOUNTER — Telehealth: Payer: Self-pay | Admitting: Pharmacist

## 2020-08-15 ENCOUNTER — Encounter: Payer: Self-pay | Admitting: Internal Medicine

## 2020-08-15 ENCOUNTER — Other Ambulatory Visit: Payer: Self-pay

## 2020-08-15 ENCOUNTER — Ambulatory Visit (INDEPENDENT_AMBULATORY_CARE_PROVIDER_SITE_OTHER): Payer: Medicare Other | Admitting: Internal Medicine

## 2020-08-15 VITALS — BP 128/70 | HR 91 | Temp 98.1°F | Ht 64.0 in | Wt 205.0 lb

## 2020-08-15 DIAGNOSIS — E7849 Other hyperlipidemia: Secondary | ICD-10-CM | POA: Diagnosis not present

## 2020-08-15 DIAGNOSIS — E890 Postprocedural hypothyroidism: Secondary | ICD-10-CM

## 2020-08-15 DIAGNOSIS — N184 Chronic kidney disease, stage 4 (severe): Secondary | ICD-10-CM

## 2020-08-15 DIAGNOSIS — I10 Essential (primary) hypertension: Secondary | ICD-10-CM | POA: Diagnosis not present

## 2020-08-15 DIAGNOSIS — R739 Hyperglycemia, unspecified: Secondary | ICD-10-CM

## 2020-08-15 DIAGNOSIS — K219 Gastro-esophageal reflux disease without esophagitis: Secondary | ICD-10-CM | POA: Diagnosis not present

## 2020-08-15 LAB — TSH: TSH: 0.8 u[IU]/mL (ref 0.35–4.50)

## 2020-08-15 LAB — LIPID PANEL
Cholesterol: 191 mg/dL (ref 0–200)
HDL: 60.8 mg/dL (ref >39.00)
LDL Cholesterol: 103 mg/dL — ABNORMAL HIGH (ref 0–99)
NonHDL: 130.63
Total CHOL/HDL Ratio: 3
Triglycerides: 139 mg/dL (ref 0.0–149.0)
VLDL: 27.8 mg/dL (ref 0.0–40.0)

## 2020-08-15 LAB — COMPREHENSIVE METABOLIC PANEL
ALT: 16 U/L (ref 0–35)
AST: 20 U/L (ref 0–37)
Albumin: 4.4 g/dL (ref 3.5–5.2)
Alkaline Phosphatase: 73 U/L (ref 39–117)
BUN: 53 mg/dL — ABNORMAL HIGH (ref 6–23)
CO2: 25 mEq/L (ref 19–32)
Calcium: 8.9 mg/dL (ref 8.4–10.5)
Chloride: 105 mEq/L (ref 96–112)
Creatinine, Ser: 2.95 mg/dL — ABNORMAL HIGH (ref 0.40–1.20)
GFR: 15.17 mL/min — ABNORMAL LOW (ref >60.00)
Glucose, Bld: 103 mg/dL — ABNORMAL HIGH (ref 70–99)
Potassium: 4.2 mEq/L (ref 3.5–5.1)
Sodium: 140 mEq/L (ref 135–145)
Total Bilirubin: 0.3 mg/dL (ref 0.2–1.2)
Total Protein: 7.6 g/dL (ref 6.0–8.3)

## 2020-08-15 LAB — HEMOGLOBIN A1C: Hgb A1c MFr Bld: 4.5 % — ABNORMAL LOW (ref 4.6–6.5)

## 2020-08-15 NOTE — Assessment & Plan Note (Signed)
Chronic a1c 

## 2020-08-15 NOTE — Assessment & Plan Note (Signed)
Chronic Check lipid panel  Continue atorvastatin 20 mg daily Regular exercise and healthy diet encouraged  

## 2020-08-15 NOTE — Assessment & Plan Note (Signed)
Chronic GERD controlled Continue  pepcid 40 mg daily 

## 2020-08-15 NOTE — Assessment & Plan Note (Signed)
Chronic  Clinically euthyroid Currently taking levothyroxine 200 mcg daily Check tsh  Titrate med dose if needed

## 2020-08-15 NOTE — Assessment & Plan Note (Signed)
Chronic following with [facility]

## 2020-08-15 NOTE — Assessment & Plan Note (Signed)
Chronic BP well controlled Continue coreg 25 mg bid, lasix 20 mg qd, hydralazine 50 mg tid, spironolactone 25 mg daily, telmisartan 40 mg daily cmp

## 2020-08-15 NOTE — Addendum Note (Signed)
Addended by: Jacob Moores on: 08/15/2020 01:19 PM   Modules accepted: Orders

## 2020-08-18 NOTE — Progress Notes (Signed)
    Chronic Care Management Pharmacy Assistant   Name: Tina Molina  MRN: [id number] DOB: 02/24/1946   Reason for Encounter: Medication Coordination Call    Recent office visits:  08/15/20, Dr. Quay Burow  Recent consult visits:  None ID  Hospital visits:  None in previous 6 months  Medications: Outpatient Encounter Medications as of 08/15/2020  Medication Sig  . aspirin 81 MG tablet Take 81 mg by mouth daily.  Marland Kitchen atorvastatin (LIPITOR) 20 MG tablet TAKE 1 TABLET(20 MG) BY MOUTH DAILY  . bimatoprost (LUMIGAN) 0.03 % ophthalmic solution 1 drop at bedtime.  . budesonide-formoterol (SYMBICORT) 160-4.5 MCG/ACT inhaler inhale 2 puffs if needed for wheezing  . carvedilol (COREG) 25 MG tablet TAKE 1 TABLET(25 MG) BY MOUTH TWICE DAILY  . cyclobenzaprine (FLEXERIL) 10 MG tablet Take 10 mg by mouth 3 (three) times daily as needed for muscle spasms.  . famotidine (PEPCID) 40 MG tablet TAKE 1 TABLET(40 MG) BY MOUTH TWICE DAILY  . furosemide (LASIX) 20 MG tablet Take 1 tablet (20 mg total) by mouth daily.  Marland Kitchen gabapentin (NEURONTIN) 300 MG capsule TAKE 1 CAPSULE(300 MG) BY MOUTH DAILY  . hydrALAZINE (APRESOLINE) 50 MG tablet Take 1 tablet (50 mg total) by mouth 3 (three) times daily.  Marland Kitchen latanoprost (XALATAN) 0.005 % ophthalmic solution 1 drop at bedtime.  Marland Kitchen leflunomide (ARAVA) 20 MG tablet Take 20 mg by mouth daily.  Marland Kitchen levothyroxine (SYNTHROID) 200 MCG tablet Take 1 tablet (200 mcg total) by mouth daily before breakfast.  . LUMIGAN 0.01 % SOLN SMARTSIG:In Eye(s)  . MELATONIN GUMMIES PO Take by mouth at bedtime as needed.  . Multiple Vitamin (MULTIVITAMIN WITH MINERALS) TABS Take 1 tablet by mouth daily.  Marland Kitchen spironolactone (ALDACTONE) 25 MG tablet Take 1 tablet (25 mg total) by mouth daily.  Marland Kitchen telmisartan (MICARDIS) 40 MG tablet Take 1 tablet (40 mg total) by mouth daily. (Patient taking differently: Take 40 mg by mouth daily. Take 1/2 tablet daily)  . vitamin E 400 UNIT capsule Take 400 Units by  mouth daily.    No facility-administered encounter medications on file as of 08/15/2020.     Reviewed chart for medication changes ahead of medication coordination call.    BP Readings from Last 3 Encounters:  08/15/20 128/70  05/15/20 119/60  04/03/20 (!) (P) 146/79    Lab Results  Component Value Date   HGBA1C 4.5 (L) 08/15/2020     Patient obtains medications through Vials  30 Days   Last adherence delivery included (date:07/23/20): (medication name and frequency) Latanoprost 0.005% eye drops Instill one drop in each eye daily at bedtime Gabapentin 300mg  Cap Take one capsule by mouth once daily Hydralazine 50mg  Tab Take one tablet by mouth three times daily    Patient is due for next adherence delivery on: 09/17/20. Called patient and reviewed medication needs.  Patient declined need for refills at this time. Patient is aware of how to contact pharmacist if refills are needed prior to next adherence delivery.    Star Rating Drugs: Telmisartan 40 mg Take 1 tab daily, last filled 07/09/20 90 ds   [facility] Pharmacist Assistant [phone]

## 2020-08-28 ENCOUNTER — Other Ambulatory Visit: Payer: Self-pay

## 2020-08-28 ENCOUNTER — Inpatient Hospital Stay: Payer: Medicare Other | Attending: Hematology & Oncology

## 2020-08-28 ENCOUNTER — Inpatient Hospital Stay: Payer: Medicare Other

## 2020-08-28 VITALS — BP 148/69 | HR 99 | Resp 18

## 2020-08-28 DIAGNOSIS — D631 Anemia in chronic kidney disease: Secondary | ICD-10-CM | POA: Diagnosis not present

## 2020-08-28 DIAGNOSIS — N182 Chronic kidney disease, stage 2 (mild): Secondary | ICD-10-CM | POA: Insufficient documentation

## 2020-08-28 DIAGNOSIS — D508 Other iron deficiency anemias: Secondary | ICD-10-CM

## 2020-08-28 LAB — CBC WITH DIFFERENTIAL (CANCER CENTER ONLY)
Abs Immature Granulocytes: 0.01 10*3/uL (ref 0.00–0.07)
Basophils Absolute: 0 10*3/uL (ref 0.0–0.1)
Basophils Relative: 0 %
Eosinophils Absolute: 0.2 10*3/uL (ref 0.0–0.5)
Eosinophils Relative: 4 %
HCT: 32.2 % — ABNORMAL LOW (ref 36.0–46.0)
Hemoglobin: 10.5 g/dL — ABNORMAL LOW (ref 12.0–15.0)
Immature Granulocytes: 0 %
Lymphocytes Relative: 50 %
Lymphs Abs: 2.5 10*3/uL (ref 0.7–4.0)
MCH: 32.7 pg (ref 26.0–34.0)
MCHC: 32.6 g/dL (ref 30.0–36.0)
MCV: 100.3 fL — ABNORMAL HIGH (ref 80.0–100.0)
Monocytes Absolute: 0.7 10*3/uL (ref 0.1–1.0)
Monocytes Relative: 14 %
Neutro Abs: 1.6 10*3/uL — ABNORMAL LOW (ref 1.7–7.7)
Neutrophils Relative %: 32 %
Platelet Count: 165 10*3/uL (ref 150–400)
RBC: 3.21 MIL/uL — ABNORMAL LOW (ref 3.87–5.11)
RDW: 13 % (ref 11.5–15.5)
WBC Count: 4.9 10*3/uL (ref 4.0–10.5)
nRBC: 0 % (ref 0.0–0.2)

## 2020-08-28 LAB — CMP (CANCER CENTER ONLY)
ALT: 15 U/L (ref 0–44)
AST: 17 U/L (ref 15–41)
Albumin: 4.3 g/dL (ref 3.5–5.0)
Alkaline Phosphatase: 59 U/L (ref 38–126)
Anion gap: 9 (ref 5–15)
BUN: 32 mg/dL — ABNORMAL HIGH (ref 8–23)
CO2: 25 mmol/L (ref 22–32)
Calcium: 9.4 mg/dL (ref 8.9–10.3)
Chloride: 109 mmol/L (ref 98–111)
Creatinine: 2.13 mg/dL — ABNORMAL HIGH (ref 0.44–1.00)
GFR, Estimated: 24 mL/min — ABNORMAL LOW (ref >60)
Glucose, Bld: 105 mg/dL — ABNORMAL HIGH (ref 70–99)
Potassium: 4.4 mmol/L (ref 3.5–5.1)
Sodium: 143 mmol/L (ref 135–145)
Total Bilirubin: 0.3 mg/dL (ref 0.3–1.2)
Total Protein: 7.1 g/dL (ref 6.5–8.1)

## 2020-08-28 MED ORDER — DARBEPOETIN ALFA 300 MCG/0.6ML IJ SOSY
PREFILLED_SYRINGE | INTRAMUSCULAR | Status: AC
  Filled 2020-08-28: qty 0.6

## 2020-08-28 MED ORDER — DARBEPOETIN ALFA 300 MCG/0.6ML IJ SOSY
300.0000 ug | PREFILLED_SYRINGE | Freq: Once | INTRAMUSCULAR | Status: AC
  Administered 2020-08-28: 300 ug via SUBCUTANEOUS

## 2020-08-28 NOTE — Patient Instructions (Signed)
Darbepoetin Alfa injection What is this medicine? DARBEPOETIN ALFA (dar be POE e tin AL fa) helps your body make more red blood cells. It is used to treat anemia caused by chronic kidney failure and chemotherapy. This medicine may be used for other purposes; ask your health care provider or pharmacist if you have questions. COMMON BRAND NAME(S): Aranesp What should I tell my health care provider before I take this medicine? They need to know if you have any of these conditions:  blood clotting disorders or history of blood clots  cancer patient not on chemotherapy  cystic fibrosis  heart disease, such as angina, heart failure, or a history of a heart attack  hemoglobin level of 12 g/dL or greater  high blood pressure  low levels of folate, iron, or vitamin B12  seizures  an unusual or allergic reaction to darbepoetin, erythropoietin, albumin, hamster proteins, latex, other medicines, foods, dyes, or preservatives  pregnant or trying to get pregnant  breast-feeding How should I use this medicine? This medicine is for injection into a vein or under the skin. It is usually given by a health care professional in a hospital or clinic setting. If you get this medicine at home, you will be taught how to prepare and give this medicine. Use exactly as directed. Take your medicine at regular intervals. Do not take your medicine more often than directed. It is important that you put your used needles and syringes in a special sharps container. Do not put them in a trash can. If you do not have a sharps container, call your pharmacist or healthcare provider to get one. A special MedGuide will be given to you by the pharmacist with each prescription and refill. Be sure to read this information carefully each time. Talk to your pediatrician regarding the use of this medicine in children. While this medicine may be used in children as young as 1 month of age for selected conditions, precautions do  apply. Overdosage: If you think you have taken too much of this medicine contact a poison control center or emergency room at once. NOTE: This medicine is only for you. Do not share this medicine with others. What if I miss a dose? If you miss a dose, take it as soon as you can. If it is almost time for your next dose, take only that dose. Do not take double or extra doses. What may interact with this medicine? Do not take this medicine with any of the following medications:  epoetin alfa This list may not describe all possible interactions. Give your health care provider a list of all the medicines, herbs, non-prescription drugs, or dietary supplements you use. Also tell them if you smoke, drink alcohol, or use illegal drugs. Some items may interact with your medicine. What should I watch for while using this medicine? Your condition will be monitored carefully while you are receiving this medicine. You may need blood work done while you are taking this medicine. This medicine may cause a decrease in vitamin B6. You should make sure that you get enough vitamin B6 while you are taking this medicine. Discuss the foods you eat and the vitamins you take with your health care professional. What side effects may I notice from receiving this medicine? Side effects that you should report to your doctor or health care professional as soon as possible:  allergic reactions like skin rash, itching or hives, swelling of the face, lips, or tongue  breathing problems  changes in   vision  chest pain  confusion, trouble speaking or understanding  feeling faint or lightheaded, falls  high blood pressure  muscle aches or pains  pain, swelling, warmth in the leg  rapid weight gain  severe headaches  sudden numbness or weakness of the face, arm or leg  trouble walking, dizziness, loss of balance or coordination  seizures (convulsions)  swelling of the ankles, feet, hands  unusually weak or  tired Side effects that usually do not require medical attention (report to your doctor or health care professional if they continue or are bothersome):  diarrhea  fever, chills (flu-like symptoms)  headaches  nausea, vomiting  redness, stinging, or swelling at site where injected This list may not describe all possible side effects. Call your doctor for medical advice about side effects. You may report side effects to FDA at 1-800-FDA-1088. Where should I keep my medicine? Keep out of the reach of children. Store in a refrigerator between 2 and 8 degrees C (36 and 46 degrees F). Do not freeze. Do not shake. Throw away any unused portion if using a single-dose vial. Throw away any unused medicine after the expiration date. NOTE: This sheet is a summary. It may not cover all possible information. If you have questions about this medicine, talk to your doctor, pharmacist, or health care provider.  2021 Elsevier/Gold Standard (2017-05-18 16:44:20)  

## 2020-09-07 ENCOUNTER — Other Ambulatory Visit: Payer: Self-pay | Admitting: Internal Medicine

## 2020-09-10 ENCOUNTER — Telehealth: Payer: Self-pay | Admitting: Pharmacist

## 2020-09-10 NOTE — Progress Notes (Addendum)
Chronic Care Management Pharmacy Assistant   Name: Tina Molina  MRN: [id number] DOB: 29-Oct-1945    Reason for Encounter: Medication Coordination Review    Recent office visits:  None ID  Recent consult visits:  None ID  Hospital visits:  None in previous 6 months  Medications: Outpatient Encounter Medications as of 09/10/2020  Medication Sig   aspirin 81 MG tablet Take 81 mg by mouth daily.   atorvastatin (LIPITOR) 20 MG tablet TAKE ONE TABLET BY MOUTH ONCE DAILY   bimatoprost (LUMIGAN) 0.03 % ophthalmic solution 1 drop at bedtime.   budesonide-formoterol (SYMBICORT) 160-4.5 MCG/ACT inhaler inhale 2 puffs if needed for wheezing   carvedilol (COREG) 25 MG tablet TAKE 1 TABLET(25 MG) BY MOUTH TWICE DAILY   cyclobenzaprine (FLEXERIL) 10 MG tablet Take 10 mg by mouth 3 (three) times daily as needed for muscle spasms.   famotidine (PEPCID) 40 MG tablet TAKE 1 TABLET(40 MG) BY MOUTH TWICE DAILY   furosemide (LASIX) 20 MG tablet Take 1 tablet (20 mg total) by mouth daily.   gabapentin (NEURONTIN) 300 MG capsule TAKE 1 CAPSULE(300 MG) BY MOUTH DAILY   hydrALAZINE (APRESOLINE) 50 MG tablet Take 1 tablet (50 mg total) by mouth 3 (three) times daily.   latanoprost (XALATAN) 0.005 % ophthalmic solution 1 drop at bedtime.   leflunomide (ARAVA) 20 MG tablet Take 20 mg by mouth daily.   levothyroxine (SYNTHROID) 200 MCG tablet TAKE ONE TABLET BY MOUTH BEFORE BREAKFAST   LUMIGAN 0.01 % SOLN SMARTSIG:In Eye(s)   MELATONIN GUMMIES PO Take by mouth at bedtime as needed.   Multiple Vitamin (MULTIVITAMIN WITH MINERALS) TABS Take 1 tablet by mouth daily.   spironolactone (ALDACTONE) 25 MG tablet Take 1 tablet (25 mg total) by mouth daily.   telmisartan (MICARDIS) 40 MG tablet Take 1 tablet (40 mg total) by mouth daily. (Patient taking differently: Take 40 mg by mouth daily. Take 1/2 tablet daily)   vitamin E 400 UNIT capsule Take 400 Units by mouth daily.    [DISCONTINUED] modafinil  (PROVIGIL) 100 MG tablet Take 100 mg by mouth daily.   [DISCONTINUED] potassium chloride (MICRO-K) 10 MEQ CR capsule Take 1 capsule (10 mEq total) by mouth 2 (two) times daily.   No facility-administered encounter medications on file as of 09/10/2020.   Reviewed chart for medication changes ahead of medication coordination call.  No OVs, Consults, or hospital visits since last care coordination call/Pharmacist visit. (If appropriate, list visit date, provider name)  No medication changes indicated OR if recent visit, treatment plan here.  BP Readings from Last 3 Encounters:  08/28/20 (!) 148/69  08/15/20 128/70  05/15/20 119/60    Lab Results  Component Value Date   HGBA1C 4.5 (L) 08/15/2020     Patient obtains medications through Vials  90 Days   Last adherence delivery included:  Latanoprost 0.005% eye drops Instill one drop in each eye daily at bedtime Gabapentin 300mg  Cap Take one capsule by mouth once daily Hydralazine 50mg  Tab Take one tablet by mouth three times daily   Patient is due for next adherence delivery on: 09/17/20. Called patient and reviewed medications and coordinated delivery.  This delivery to include: Cyclobenzaprine 10 mg 1 tab 3 times daily, breakfast,lunch, dinner Leflunomide 20 mg 1 tab daily breakfast Spironolactone 25 mg 1 tab daily breakfast Levothyroxine 200 mcg 1 tab daily breakfast Atorvastatin 20 mg 1 tab daily breakfast Furosemide 20 mg daily Latanoprost eye drops   Confirmed delivery date of 09/17/20,  advised patient that pharmacy will contact them the morning of delivery.   Star Rating Drugs: Atorvastatin 09/08/20 90 ds Telmisartan 07/09/20 90 ds  [place] Pharmacist Assistant [phone]

## 2020-09-24 DIAGNOSIS — M150 Primary generalized (osteo)arthritis: Secondary | ICD-10-CM | POA: Diagnosis not present

## 2020-09-24 DIAGNOSIS — M25512 Pain in left shoulder: Secondary | ICD-10-CM | POA: Diagnosis not present

## 2020-09-24 DIAGNOSIS — N184 Chronic kidney disease, stage 4 (severe): Secondary | ICD-10-CM | POA: Diagnosis not present

## 2020-09-24 DIAGNOSIS — M0589 Other rheumatoid arthritis with rheumatoid factor of multiple sites: Secondary | ICD-10-CM | POA: Diagnosis not present

## 2020-09-24 DIAGNOSIS — D860 Sarcoidosis of lung: Secondary | ICD-10-CM | POA: Diagnosis not present

## 2020-09-24 DIAGNOSIS — Z6834 Body mass index (BMI) 34.0-34.9, adult: Secondary | ICD-10-CM | POA: Diagnosis not present

## 2020-09-24 DIAGNOSIS — E669 Obesity, unspecified: Secondary | ICD-10-CM | POA: Diagnosis not present

## 2020-09-24 DIAGNOSIS — M5136 Other intervertebral disc degeneration, lumbar region: Secondary | ICD-10-CM | POA: Diagnosis not present

## 2020-09-27 ENCOUNTER — Other Ambulatory Visit: Payer: Self-pay | Admitting: Internal Medicine

## 2020-09-29 DIAGNOSIS — M25512 Pain in left shoulder: Secondary | ICD-10-CM | POA: Diagnosis not present

## 2020-09-29 DIAGNOSIS — Z23 Encounter for immunization: Secondary | ICD-10-CM | POA: Diagnosis not present

## 2020-10-12 ENCOUNTER — Other Ambulatory Visit: Payer: Self-pay | Admitting: Internal Medicine

## 2020-10-13 ENCOUNTER — Other Ambulatory Visit: Payer: Self-pay | Admitting: Internal Medicine

## 2020-10-13 DIAGNOSIS — I10 Essential (primary) hypertension: Secondary | ICD-10-CM

## 2020-10-15 ENCOUNTER — Other Ambulatory Visit: Payer: Self-pay

## 2020-10-15 ENCOUNTER — Inpatient Hospital Stay: Payer: Medicare Other | Attending: Hematology & Oncology

## 2020-10-15 ENCOUNTER — Inpatient Hospital Stay: Payer: Medicare Other

## 2020-10-15 ENCOUNTER — Inpatient Hospital Stay (HOSPITAL_BASED_OUTPATIENT_CLINIC_OR_DEPARTMENT_OTHER): Payer: Medicare Other | Admitting: Family

## 2020-10-15 ENCOUNTER — Encounter: Payer: Self-pay | Admitting: Family

## 2020-10-15 ENCOUNTER — Telehealth: Payer: Self-pay | Admitting: *Deleted

## 2020-10-15 VITALS — BP 117/78 | HR 86 | Temp 98.4°F | Resp 16 | Ht 64.0 in | Wt 200.1 lb

## 2020-10-15 DIAGNOSIS — D508 Other iron deficiency anemias: Secondary | ICD-10-CM | POA: Diagnosis not present

## 2020-10-15 DIAGNOSIS — Z79899 Other long term (current) drug therapy: Secondary | ICD-10-CM | POA: Insufficient documentation

## 2020-10-15 DIAGNOSIS — D631 Anemia in chronic kidney disease: Secondary | ICD-10-CM | POA: Insufficient documentation

## 2020-10-15 DIAGNOSIS — D472 Monoclonal gammopathy: Secondary | ICD-10-CM | POA: Insufficient documentation

## 2020-10-15 DIAGNOSIS — I129 Hypertensive chronic kidney disease with stage 1 through stage 4 chronic kidney disease, or unspecified chronic kidney disease: Secondary | ICD-10-CM

## 2020-10-15 DIAGNOSIS — N182 Chronic kidney disease, stage 2 (mild): Secondary | ICD-10-CM | POA: Insufficient documentation

## 2020-10-15 DIAGNOSIS — Z7982 Long term (current) use of aspirin: Secondary | ICD-10-CM | POA: Diagnosis not present

## 2020-10-15 DIAGNOSIS — N184 Chronic kidney disease, stage 4 (severe): Secondary | ICD-10-CM

## 2020-10-15 LAB — CBC WITH DIFFERENTIAL (CANCER CENTER ONLY)
Abs Immature Granulocytes: 0.01 10*3/uL (ref 0.00–0.07)
Basophils Absolute: 0.1 10*3/uL (ref 0.0–0.1)
Basophils Relative: 1 %
Eosinophils Absolute: 0.1 10*3/uL (ref 0.0–0.5)
Eosinophils Relative: 3 %
HCT: 32.7 % — ABNORMAL LOW (ref 36.0–46.0)
Hemoglobin: 10.5 g/dL — ABNORMAL LOW (ref 12.0–15.0)
Immature Granulocytes: 0 %
Lymphocytes Relative: 57 %
Lymphs Abs: 2.5 10*3/uL (ref 0.7–4.0)
MCH: 31.5 pg (ref 26.0–34.0)
MCHC: 32.1 g/dL (ref 30.0–36.0)
MCV: 98.2 fL (ref 80.0–100.0)
Monocytes Absolute: 0.4 10*3/uL (ref 0.1–1.0)
Monocytes Relative: 10 %
Neutro Abs: 1.2 10*3/uL — ABNORMAL LOW (ref 1.7–7.7)
Neutrophils Relative %: 29 %
Platelet Count: 162 10*3/uL (ref 150–400)
RBC: 3.33 MIL/uL — ABNORMAL LOW (ref 3.87–5.11)
RDW: 13.8 % (ref 11.5–15.5)
WBC Count: 4.3 10*3/uL (ref 4.0–10.5)
nRBC: 0 % (ref 0.0–0.2)

## 2020-10-15 LAB — CMP (CANCER CENTER ONLY)
ALT: 17 U/L (ref 0–44)
AST: 16 U/L (ref 15–41)
Albumin: 4.2 g/dL (ref 3.5–5.0)
Alkaline Phosphatase: 68 U/L (ref 38–126)
Anion gap: 7 (ref 5–15)
BUN: 42 mg/dL — ABNORMAL HIGH (ref 8–23)
CO2: 27 mmol/L (ref 22–32)
Calcium: 9.6 mg/dL (ref 8.9–10.3)
Chloride: 111 mmol/L (ref 98–111)
Creatinine: 1.77 mg/dL — ABNORMAL HIGH (ref 0.44–1.00)
GFR, Estimated: 30 mL/min — ABNORMAL LOW (ref >60)
Glucose, Bld: 100 mg/dL — ABNORMAL HIGH (ref 70–99)
Potassium: 4.7 mmol/L (ref 3.5–5.1)
Sodium: 145 mmol/L (ref 135–145)
Total Bilirubin: 0.4 mg/dL (ref 0.3–1.2)
Total Protein: 7 g/dL (ref 6.5–8.1)

## 2020-10-15 LAB — RETICULOCYTES
Immature Retic Fract: 9.4 % (ref 2.3–15.9)
RBC.: 3.27 MIL/uL — ABNORMAL LOW (ref 3.87–5.11)
Retic Count, Absolute: 33.4 10*3/uL (ref 19.0–186.0)
Retic Ct Pct: 1 % (ref 0.4–3.1)

## 2020-10-15 MED ORDER — DARBEPOETIN ALFA 300 MCG/0.6ML IJ SOSY
PREFILLED_SYRINGE | INTRAMUSCULAR | Status: AC
  Filled 2020-10-15: qty 0.6

## 2020-10-15 MED ORDER — DARBEPOETIN ALFA 300 MCG/0.6ML IJ SOSY
300.0000 ug | PREFILLED_SYRINGE | Freq: Once | INTRAMUSCULAR | Status: AC
  Administered 2020-10-15: 300 ug via SUBCUTANEOUS

## 2020-10-15 NOTE — Telephone Encounter (Signed)
10/15/20 los - gave patient upcoming appointments with calendar - view mychart

## 2020-10-15 NOTE — Progress Notes (Signed)
Hematology and Oncology Follow Up Visit  Tina Molina [id number] [birth year] 75 y.o. 10/15/2020   Principle Diagnosis:  Anemia of chronic renal failure - stage II MGUS - IgA lambda Iron deficiency anemia  Current Therapy: Aranesp 300 mcg SQ to keep Hgb > 11 IV iron as indicated   Interim History:  Tina Molina is here today for follow-up. She is doing well but notes some fatigue at times.  No episodes of blood loss. No bruising or petechiae.  She has occasional episodes of dizziness and SOB with exertion and takes a break to rest when needed.  She denies fever, chills, n/v, cough, rash, chest pain, abdominal pain or changes in bowel or bladder habits.  No swelling, tenderness, numbness or tingling in her extremities.  No falls or syncope.  She has a good appetite and is staying well hydrated. Her weight is stable at 200 lbs.   ECOG Performance Status: 1 - Symptomatic but completely ambulatory  Medications:  Allergies as of 10/15/2020      Reactions   Penicillins Other (See Comments)   Intolerance or allergy not remembered; her sister stated penicillin caused "boils under the skin"   Shellfish Allergy Rash   Shellfish-derived Products Anaphylaxis   Codeine Nausea Only   Nausea only with liquid codeine   Infliximab Other (See Comments)    Confusion (intolerance) Knots under the skin   Sulfonamide Derivatives Rash   Joint pain   Azithromycin Itching      Medication List       Accurate as of October 15, 2020  2:08 PM. If you have any questions, ask your nurse or doctor.        aspirin 81 MG tablet Take 81 mg by mouth daily.   atorvastatin 20 MG tablet Commonly known as: LIPITOR TAKE ONE TABLET BY MOUTH ONCE DAILY   bimatoprost 0.03 % ophthalmic solution Commonly known as: LUMIGAN 1 drop at bedtime.   Lumigan 0.01 % Soln Generic drug: bimatoprost SMARTSIG:In Eye(s)   budesonide-formoterol 160-4.5 MCG/ACT inhaler Commonly known as: Symbicort inhale 2 puffs  if needed for wheezing   carvedilol 25 MG tablet Commonly known as: COREG TAKE ONE TABLET BY MOUTH TWICE DAILY   cyclobenzaprine 10 MG tablet Commonly known as: FLEXERIL Take 10 mg by mouth 3 (three) times daily as needed for muscle spasms.   famotidine 40 MG tablet Commonly known as: PEPCID TAKE 1 TABLET(40 MG) BY MOUTH TWICE DAILY   furosemide 20 MG tablet Commonly known as: LASIX Take 1 tablet (20 mg total) by mouth daily.   gabapentin 300 MG capsule Commonly known as: NEURONTIN TAKE 1 CAPSULE(300 MG) BY MOUTH DAILY   hydrALAZINE 50 MG tablet Commonly known as: APRESOLINE TAKE ONE TABLET BY MOUTH THREE TIMES DAILY   latanoprost 0.005 % ophthalmic solution Commonly known as: XALATAN 1 drop at bedtime.   leflunomide 20 MG tablet Commonly known as: ARAVA Take 20 mg by mouth daily.   levothyroxine 200 MCG tablet Commonly known as: SYNTHROID TAKE ONE TABLET BY MOUTH BEFORE BREAKFAST   MELATONIN GUMMIES PO Take by mouth at bedtime as needed.   multivitamin with minerals Tabs tablet Take 1 tablet by mouth daily.   spironolactone 25 MG tablet Commonly known as: Aldactone Take 1 tablet (25 mg total) by mouth daily.   telmisartan 40 MG tablet Commonly known as: MICARDIS TAKE ONE TABLET BY MOUTH ONCE DAILY   vitamin E 180 MG (400 UNITS) capsule Take 400 Units by mouth daily.  Allergies:  Allergies  Allergen Reactions  . Penicillins Other (See Comments)    Intolerance or allergy not remembered; her sister stated penicillin caused "boils under the skin"  . Shellfish Allergy Rash  . Shellfish-Derived Products Anaphylaxis  . Codeine Nausea Only    Nausea only with liquid codeine  . Infliximab Other (See Comments)     Confusion (intolerance) Knots under the skin  . Sulfonamide Derivatives Rash    Joint pain  . Azithromycin Itching    Past Medical History, Surgical history, Social history, and Family History were reviewed and updated.  Review of  Systems: All other 10 point review of systems is negative.   Physical Exam:  height is 5\' 4"  (1.626 m) and weight is 200 lb 1.9 oz (90.8 kg). Her oral temperature is 98.4 F (36.9 C). Her blood pressure is 117/78 and her pulse is 86. Her respiration is 16 and oxygen saturation is 99%.   Wt Readings from Last 3 Encounters:  10/15/20 200 lb 1.9 oz (90.8 kg)  08/15/20 205 lb (93 kg)  05/15/20 203 lb 12.8 oz (92.4 kg)    Ocular: Sclerae unicteric, pupils equal, round and reactive to light Ear-nose-throat: Oropharynx clear, dentition fair Lymphatic: No cervical or supraclavicular adenopathy Lungs no rales or rhonchi, good excursion bilaterally Heart regular rate and rhythm, no murmur appreciated Abd soft, nontender, positive bowel sounds MSK no focal spinal tenderness, no joint edema Neuro: non-focal, well-oriented, appropriate affect Breasts: Deferred   Lab Results  Component Value Date   WBC 4.3 10/15/2020   HGB 10.5 (L) 10/15/2020   HCT 32.7 (L) 10/15/2020   MCV 98.2 10/15/2020   PLT 162 10/15/2020   Lab Results  Component Value Date   FERRITIN 225 07/17/2020   IRON 101 07/17/2020   TIBC 301 07/17/2020   UIBC 200 07/17/2020   IRONPCTSAT 34 07/17/2020   Lab Results  Component Value Date   RETICCTPCT 1.0 10/15/2020   RBC 3.33 (L) 10/15/2020   RBC 3.27 (L) 10/15/2020   RETICCTABS 51.6 01/03/2013   Lab Results  Component Value Date   KPAFRELGTCHN 25.6 (H) 07/17/2020   LAMBDASER 40.5 (H) 07/17/2020   KAPLAMBRATIO 0.63 07/17/2020   Lab Results  Component Value Date   IGGSERUM 957 07/17/2020   IGA 569 (H) 07/17/2020   IGMSERUM 21 (L) 07/17/2020   Lab Results  Component Value Date   TOTALPROTELP 6.9 07/17/2020   ALBUMINELP 4.0 07/17/2020   A1GS 0.2 07/17/2020   A2GS 0.6 07/17/2020   BETS 1.3 07/17/2020   BETA2SER 10.3 (H) 11/04/2011   GAMS 0.8 07/17/2020   MSPIKE 0.2 (H) 07/17/2020   SPEI Comment 07/17/2020     Chemistry      Component Value Date/Time    NA 145 10/15/2020 1327   NA 150 (H) 03/01/2017 1308   NA 143 04/05/2016 1156   K 4.7 10/15/2020 1327   K 4.0 03/01/2017 1308   K 3.8 04/05/2016 1156   CL 111 10/15/2020 1327   CL 109 (H) 03/01/2017 1308   CO2 27 10/15/2020 1327   CO2 28 03/01/2017 1308   CO2 21 (L) 04/05/2016 1156   BUN 42 (H) 10/15/2020 1327   BUN 19 03/01/2017 1308   BUN 18.5 04/05/2016 1156   CREATININE 1.77 (H) 10/15/2020 1327   CREATININE 1.8 (H) 03/01/2017 1308   CREATININE 1.5 (H) 04/05/2016 1156      Component Value Date/Time   CALCIUM 9.6 10/15/2020 1327   CALCIUM 9.7 03/01/2017 1308   CALCIUM  9.3 04/05/2016 1156   ALKPHOS 68 10/15/2020 1327   ALKPHOS 55 03/01/2017 1308   ALKPHOS 77 04/05/2016 1156   AST 16 10/15/2020 1327   AST 21 04/05/2016 1156   ALT 17 10/15/2020 1327   ALT 18 03/01/2017 1308   ALT 22 04/05/2016 1156   BILITOT 0.4 10/15/2020 1327   BILITOT 0.36 04/05/2016 1156       Impression and Plan: Ms. Cushman is a very pleasant 49yoAfrican American female with multifactorial anemia as well as an IgA lambda MGUS. ESA given, Hgb 10.5.  Lab every 6 weeks with injection and follow-up in 3 months.  She can contact our office with any questions or concerns.   Laverna Peace, NP 6/1/20222:08 PM

## 2020-10-15 NOTE — Patient Instructions (Signed)
Darbepoetin Alfa injection What is this medicine? DARBEPOETIN ALFA (dar be POE e tin AL fa) helps your body make more red blood cells. It is used to treat anemia caused by chronic kidney failure and chemotherapy. This medicine may be used for other purposes; ask your health care provider or pharmacist if you have questions. COMMON BRAND NAME(S): Aranesp What should I tell my health care provider before I take this medicine? They need to know if you have any of these conditions:  blood clotting disorders or history of blood clots  cancer patient not on chemotherapy  cystic fibrosis  heart disease, such as angina, heart failure, or a history of a heart attack  hemoglobin level of 12 g/dL or greater  high blood pressure  low levels of folate, iron, or vitamin B12  seizures  an unusual or allergic reaction to darbepoetin, erythropoietin, albumin, hamster proteins, latex, other medicines, foods, dyes, or preservatives  pregnant or trying to get pregnant  breast-feeding How should I use this medicine? This medicine is for injection into a vein or under the skin. It is usually given by a health care professional in a hospital or clinic setting. If you get this medicine at home, you will be taught how to prepare and give this medicine. Use exactly as directed. Take your medicine at regular intervals. Do not take your medicine more often than directed. It is important that you put your used needles and syringes in a special sharps container. Do not put them in a trash can. If you do not have a sharps container, call your pharmacist or healthcare provider to get one. A special MedGuide will be given to you by the pharmacist with each prescription and refill. Be sure to read this information carefully each time. Talk to your pediatrician regarding the use of this medicine in children. While this medicine may be used in children as young as 1 month of age for selected conditions, precautions do  apply. Overdosage: If you think you have taken too much of this medicine contact a poison control center or emergency room at once. NOTE: This medicine is only for you. Do not share this medicine with others. What if I miss a dose? If you miss a dose, take it as soon as you can. If it is almost time for your next dose, take only that dose. Do not take double or extra doses. What may interact with this medicine? Do not take this medicine with any of the following medications:  epoetin alfa This list may not describe all possible interactions. Give your health care provider a list of all the medicines, herbs, non-prescription drugs, or dietary supplements you use. Also tell them if you smoke, drink alcohol, or use illegal drugs. Some items may interact with your medicine. What should I watch for while using this medicine? Your condition will be monitored carefully while you are receiving this medicine. You may need blood work done while you are taking this medicine. This medicine may cause a decrease in vitamin B6. You should make sure that you get enough vitamin B6 while you are taking this medicine. Discuss the foods you eat and the vitamins you take with your health care professional. What side effects may I notice from receiving this medicine? Side effects that you should report to your doctor or health care professional as soon as possible:  allergic reactions like skin rash, itching or hives, swelling of the face, lips, or tongue  breathing problems  changes in   vision  chest pain  confusion, trouble speaking or understanding  feeling faint or lightheaded, falls  high blood pressure  muscle aches or pains  pain, swelling, warmth in the leg  rapid weight gain  severe headaches  sudden numbness or weakness of the face, arm or leg  trouble walking, dizziness, loss of balance or coordination  seizures (convulsions)  swelling of the ankles, feet, hands  unusually weak or  tired Side effects that usually do not require medical attention (report to your doctor or health care professional if they continue or are bothersome):  diarrhea  fever, chills (flu-like symptoms)  headaches  nausea, vomiting  redness, stinging, or swelling at site where injected This list may not describe all possible side effects. Call your doctor for medical advice about side effects. You may report side effects to FDA at 1-800-FDA-1088. Where should I keep my medicine? Keep out of the reach of children. Store in a refrigerator between 2 and 8 degrees C (36 and 46 degrees F). Do not freeze. Do not shake. Throw away any unused portion if using a single-dose vial. Throw away any unused medicine after the expiration date. NOTE: This sheet is a summary. It may not cover all possible information. If you have questions about this medicine, talk to your doctor, pharmacist, or health care provider.  2021 Elsevier/Gold Standard (2017-05-18 16:44:20)  

## 2020-10-16 ENCOUNTER — Other Ambulatory Visit: Payer: Medicare Other

## 2020-10-16 ENCOUNTER — Inpatient Hospital Stay: Payer: Medicare Other

## 2020-10-16 ENCOUNTER — Ambulatory Visit: Payer: Medicare Other | Admitting: Family

## 2020-10-16 LAB — IRON AND TIBC
Iron: 145 ug/dL — ABNORMAL HIGH (ref 41–142)
Saturation Ratios: 54 % (ref 21–57)
TIBC: 269 ug/dL (ref 236–444)
UIBC: 124 ug/dL (ref 120–384)

## 2020-10-16 LAB — FERRITIN: Ferritin: 280 ng/mL (ref 11–307)

## 2020-10-30 DIAGNOSIS — I10 Essential (primary) hypertension: Secondary | ICD-10-CM | POA: Diagnosis not present

## 2020-10-30 DIAGNOSIS — Z6834 Body mass index (BMI) 34.0-34.9, adult: Secondary | ICD-10-CM | POA: Diagnosis not present

## 2020-10-30 DIAGNOSIS — M5415 Radiculopathy, thoracolumbar region: Secondary | ICD-10-CM | POA: Diagnosis not present

## 2020-11-11 DIAGNOSIS — H40053 Ocular hypertension, bilateral: Secondary | ICD-10-CM | POA: Diagnosis not present

## 2020-11-26 ENCOUNTER — Inpatient Hospital Stay: Payer: Medicare Other

## 2020-11-26 ENCOUNTER — Inpatient Hospital Stay: Payer: Medicare Other | Attending: Hematology & Oncology

## 2020-11-26 ENCOUNTER — Other Ambulatory Visit: Payer: Self-pay

## 2020-11-26 VITALS — BP 123/56 | HR 94 | Temp 98.0°F | Resp 16

## 2020-11-26 DIAGNOSIS — D631 Anemia in chronic kidney disease: Secondary | ICD-10-CM | POA: Diagnosis not present

## 2020-11-26 DIAGNOSIS — N182 Chronic kidney disease, stage 2 (mild): Secondary | ICD-10-CM | POA: Insufficient documentation

## 2020-11-26 DIAGNOSIS — D508 Other iron deficiency anemias: Secondary | ICD-10-CM

## 2020-11-26 LAB — CMP (CANCER CENTER ONLY)
ALT: 19 U/L (ref 0–44)
AST: 20 U/L (ref 15–41)
Albumin: 4.2 g/dL (ref 3.5–5.0)
Alkaline Phosphatase: 78 U/L (ref 38–126)
Anion gap: 8 (ref 5–15)
BUN: 37 mg/dL — ABNORMAL HIGH (ref 8–23)
CO2: 28 mmol/L (ref 22–32)
Calcium: 9.7 mg/dL (ref 8.9–10.3)
Chloride: 110 mmol/L (ref 98–111)
Creatinine: 2.01 mg/dL — ABNORMAL HIGH (ref 0.44–1.00)
GFR, Estimated: 26 mL/min — ABNORMAL LOW (ref >60)
Glucose, Bld: 110 mg/dL — ABNORMAL HIGH (ref 70–99)
Potassium: 4.7 mmol/L (ref 3.5–5.1)
Sodium: 146 mmol/L — ABNORMAL HIGH (ref 135–145)
Total Bilirubin: 0.4 mg/dL (ref 0.3–1.2)
Total Protein: 7 g/dL (ref 6.5–8.1)

## 2020-11-26 LAB — CBC WITH DIFFERENTIAL (CANCER CENTER ONLY)
Abs Immature Granulocytes: 0.01 10*3/uL (ref 0.00–0.07)
Basophils Absolute: 0 10*3/uL (ref 0.0–0.1)
Basophils Relative: 1 %
Eosinophils Absolute: 0.1 10*3/uL (ref 0.0–0.5)
Eosinophils Relative: 3 %
HCT: 32.5 % — ABNORMAL LOW (ref 36.0–46.0)
Hemoglobin: 10.4 g/dL — ABNORMAL LOW (ref 12.0–15.0)
Immature Granulocytes: 0 %
Lymphocytes Relative: 53 %
Lymphs Abs: 2.3 10*3/uL (ref 0.7–4.0)
MCH: 33 pg (ref 26.0–34.0)
MCHC: 32 g/dL (ref 30.0–36.0)
MCV: 103.2 fL — ABNORMAL HIGH (ref 80.0–100.0)
Monocytes Absolute: 0.7 10*3/uL (ref 0.1–1.0)
Monocytes Relative: 15 %
Neutro Abs: 1.2 10*3/uL — ABNORMAL LOW (ref 1.7–7.7)
Neutrophils Relative %: 28 %
Platelet Count: 180 10*3/uL (ref 150–400)
RBC: 3.15 MIL/uL — ABNORMAL LOW (ref 3.87–5.11)
RDW: 14.6 % (ref 11.5–15.5)
WBC Count: 4.4 10*3/uL (ref 4.0–10.5)
nRBC: 0 % (ref 0.0–0.2)

## 2020-11-26 MED ORDER — DARBEPOETIN ALFA 300 MCG/0.6ML IJ SOSY
PREFILLED_SYRINGE | INTRAMUSCULAR | Status: AC
  Filled 2020-11-26: qty 0.6

## 2020-11-26 MED ORDER — DARBEPOETIN ALFA 300 MCG/0.6ML IJ SOSY
300.0000 ug | PREFILLED_SYRINGE | Freq: Once | INTRAMUSCULAR | Status: AC
  Administered 2020-11-26: 300 ug via SUBCUTANEOUS

## 2020-11-26 NOTE — Patient Instructions (Signed)
Darbepoetin Alfa injection What is this medication? DARBEPOETIN ALFA (dar be POE e tin AL fa) helps your body make more red blood cells. It is used to treat anemia caused by chronic kidney failure and chemotherapy. This medicine may be used for other purposes; ask your health care provider or pharmacist if you have questions. COMMON BRAND NAME(S): Aranesp What should I tell my care team before I take this medication? They need to know if you have any of these conditions: blood clotting disorders or history of blood clots cancer patient not on chemotherapy cystic fibrosis heart disease, such as angina, heart failure, or a history of a heart attack hemoglobin level of 12 g/dL or greater high blood pressure low levels of folate, iron, or vitamin B12 seizures an unusual or allergic reaction to darbepoetin, erythropoietin, albumin, hamster proteins, latex, other medicines, foods, dyes, or preservatives pregnant or trying to get pregnant breast-feeding How should I use this medication? This medicine is for injection into a vein or under the skin. It is usually given by a health care professional in a hospital or clinic setting. If you get this medicine at home, you will be taught how to prepare and give this medicine. Use exactly as directed. Take your medicine at regular intervals. Do not take your medicine more often than directed. It is important that you put your used needles and syringes in a special sharps container. Do not put them in a trash can. If you do not have a sharps container, call your pharmacist or healthcare provider to get one. A special MedGuide will be given to you by the pharmacist with each prescription and refill. Be sure to read this information carefully each time. Talk to your pediatrician regarding the use of this medicine in children. While this medicine may be used in children as young as 1 month of age for selected conditions, precautions do apply. Overdosage: If you  think you have taken too much of this medicine contact a poison control center or emergency room at once. NOTE: This medicine is only for you. Do not share this medicine with others. What if I miss a dose? If you miss a dose, take it as soon as you can. If it is almost time for your next dose, take only that dose. Do not take double or extra doses. What may interact with this medication? Do not take this medicine with any of the following medications: epoetin alfa This list may not describe all possible interactions. Give your health care provider a list of all the medicines, herbs, non-prescription drugs, or dietary supplements you use. Also tell them if you smoke, drink alcohol, or use illegal drugs. Some items may interact with your medicine. What should I watch for while using this medication? Your condition will be monitored carefully while you are receiving this medicine. You may need blood work done while you are taking this medicine. This medicine may cause a decrease in vitamin B6. You should make sure that you get enough vitamin B6 while you are taking this medicine. Discuss the foods you eat and the vitamins you take with your health care professional. What side effects may I notice from receiving this medication? Side effects that you should report to your doctor or health care professional as soon as possible: allergic reactions like skin rash, itching or hives, swelling of the face, lips, or tongue breathing problems changes in vision chest pain confusion, trouble speaking or understanding feeling faint or lightheaded, falls high blood pressure   muscle aches or pains pain, swelling, warmth in the leg rapid weight gain severe headaches sudden numbness or weakness of the face, arm or leg trouble walking, dizziness, loss of balance or coordination seizures (convulsions) swelling of the ankles, feet, hands unusually weak or tired Side effects that usually do not require medical  attention (report to your doctor or health care professional if they continue or are bothersome): diarrhea fever, chills (flu-like symptoms) headaches nausea, vomiting redness, stinging, or swelling at site where injected This list may not describe all possible side effects. Call your doctor for medical advice about side effects. You may report side effects to FDA at 1-800-FDA-1088. Where should I keep my medication? Keep out of the reach of children. Store in a refrigerator between 2 and 8 degrees C (36 and 46 degrees F). Do not freeze. Do not shake. Throw away any unused portion if using a single-dose vial. Throw away any unused medicine after the expiration date. NOTE: This sheet is a summary. It may not cover all possible information. If you have questions about this medicine, talk to your doctor, pharmacist, or health care provider.  2022 Elsevier/Gold Standard (2017-05-18 16:44:20)  

## 2020-12-01 ENCOUNTER — Telehealth: Payer: Self-pay | Admitting: Pharmacist

## 2020-12-01 NOTE — Progress Notes (Signed)
    Chronic Care Management Pharmacy Assistant   Name: Tina Molina  MRN: [id number] DOB: 1945-11-05   Reason for Encounter: Medication Adherence Call   Hospital visits:  None in previous 6 months   Medications   aspirin 81 MG tablet  atorvastatin (LIPITOR) 20 MG table - last filled 09/12/20 90 days budesonide-formoterol (SYMBICORT) 160-4.5 MCG/ACT inhale - last filled  not available carvedilol (COREG) 25 MG tablet - last filled  10/14/20 90 days cyclobenzaprine (FLEXERIL) 10 MG tablet - last filled 09/12/20 90 days famotidine (PEPCID) 40 MG tablet - last filled  furosemide (LASIX) 20 MG table 10/13/20 60 days gabapentin (NEURONTIN) 300 MG capsule - last filled 10/13/20 60 days  hydrALAZINE (APRESOLINE) 50 MG tablet - last filled  latanoprost (XALATAN) 0.005 % ophthalmic solution 10/14/20 60 days  leflunomide (ARAVA) 20 MG tablet - last filled 09/12/20 90 days  levothyroxine (SYNTHROID) 200 MCG tablet - last filled 09/12/20 90 days  MELATONIN GUMMIES PO Multiple Vitamin (MULTIVITAMIN WITH MINERALS) TABS spironolactone (ALDACTONE) 25 MG tablet - last filled 09/16/20 90 days  telmisartan (MICARDIS) 40 MG tablet -  last filled  530/22 60 days  vitamin E 400 UNIT capsule   Reviewed chart for medication changes ahead of medication coordination call.  No OVs, Consults, or hospital visits since last care coordination call/Pharmacist visit. (If appropriate, list visit date, provider name)  No medication changes indicated OR if recent visit, treatment plan here.  BP Readings from Last 3 Encounters:  11/26/20 (!) 123/56  10/15/20 117/78  08/28/20 (!) 148/69    Lab Results  Component Value Date   HGBA1C 4.5 (L) 08/15/2020     Patient obtains medications through Vials  90 Days   Last adherence delivery included:  Cyclobenzaprine 10 mg 1 tab 3 times daily, breakfast,lunch, dinner Leflunomide 20 mg 1 tab daily breakfast Spironolactone 25 mg 1 tab daily breakfast Levothyroxine 200  mcg 1 tab daily breakfast Atorvastatin 20 mg 1 tab daily breakfast Furosemide 20 mg daily Latanoprost eye drops   Patient is due for next adherence delivery on: 12/11/20.  Called patient and reviewed medications and coordinated delivery. This delivery to include: Levothyroxine 200 mcg 1 tab daily breakfast Telmisartan 40 mg 1 tab daily Famotidine 40 mg 1 tab twice daily  Patient needs refills for none noted.  Confirmed delivery date of 12/11/20, advised patient that pharmacy will contact them the morning of delivery.   Star Rating Drugs: telmisartan (MICARDIS) 40 MG tablet -  last filled  530/22 60 days  atorvastatin (LIPITOR) 20 MG table - last filled 09/12/20 90 days   [facility] Pharmacist Assistant [phone]  Time spent:64

## 2020-12-03 NOTE — Progress Notes (Signed)
    Chronic Care Management Pharmacy Assistant   Name: Tina Molina  MRN: [id number] DOB: [birth year]   Reason for Encounter: Medication Review   Care Gaps:Received call from patient regarding medication management via [facility].  Patient requested an acute fill for Gabapentin and Latanoprost to be delivered 12/03/20 Pharmacy needs refills? No  Confirmed delivery date of 12/03/20, advised patient that pharmacy will contact them the morning of delivery.  Wendy Poet

## 2020-12-08 ENCOUNTER — Other Ambulatory Visit: Payer: Self-pay | Admitting: Internal Medicine

## 2020-12-08 DIAGNOSIS — I10 Essential (primary) hypertension: Secondary | ICD-10-CM

## 2020-12-22 ENCOUNTER — Other Ambulatory Visit: Payer: Self-pay | Admitting: Internal Medicine

## 2020-12-31 ENCOUNTER — Telehealth: Payer: Self-pay | Admitting: *Deleted

## 2020-12-31 NOTE — Telephone Encounter (Signed)
Called and lvm to reschedule from 01/16/21 to 01/22/21 @ 1:15 p.m. with Judson Roch - requested call back to confirm

## 2021-01-02 ENCOUNTER — Telehealth: Payer: Self-pay | Admitting: Pharmacist

## 2021-01-02 NOTE — Progress Notes (Signed)
    Chronic Care Management Pharmacy Assistant   Name: LASHONDRA VAQUERANO  MRN: [id number] DOB: [birth year]   Reason for Encounter: Medication Review    Medications: Outpatient Encounter Medications as of 01/02/2021  Medication Sig   aspirin 81 MG tablet Take 81 mg by mouth daily.   atorvastatin (LIPITOR) 20 MG tablet TAKE ONE TABLET BY MOUTH ONCE DAILY   budesonide-formoterol (SYMBICORT) 160-4.5 MCG/ACT inhaler inhale 2 puffs if needed for wheezing   carvedilol (COREG) 25 MG tablet TAKE ONE TABLET BY MOUTH TWICE DAILY   cyclobenzaprine (FLEXERIL) 10 MG tablet Take 10 mg by mouth 3 (three) times daily as needed for muscle spasms.   famotidine (PEPCID) 40 MG tablet TAKE ONE TABLET BY MOUTH TWICE DAILY   furosemide (LASIX) 20 MG tablet TAKE ONE TABLET BY MOUTH DAILY   gabapentin (NEURONTIN) 300 MG capsule TAKE ONE CAPSULE BY MOUTH ONCE DAILY   hydrALAZINE (APRESOLINE) 50 MG tablet TAKE ONE TABLET BY MOUTH THREE TIMES DAILY   latanoprost (XALATAN) 0.005 % ophthalmic solution 1 drop at bedtime.   leflunomide (ARAVA) 20 MG tablet Take 20 mg by mouth daily.   levothyroxine (SYNTHROID) 200 MCG tablet TAKE ONE TABLET BY MOUTH BEFORE BREAKFAST   MELATONIN GUMMIES PO Take by mouth at bedtime as needed.   Multiple Vitamin (MULTIVITAMIN WITH MINERALS) TABS Take 1 tablet by mouth daily.   spironolactone (ALDACTONE) 25 MG tablet TAKE ONE TABLET BY MOUTH DAILY   telmisartan (MICARDIS) 40 MG tablet TAKE ONE TABLET BY MOUTH DAILY   vitamin E 400 UNIT capsule Take 400 Units by mouth daily.    [DISCONTINUED] modafinil (PROVIGIL) 100 MG tablet Take 100 mg by mouth daily.   [DISCONTINUED] potassium chloride (MICRO-K) 10 MEQ CR capsule Take 1 capsule (10 mEq total) by mouth 2 (two) times daily.   No facility-administered encounter medications on file as of 01/02/2021.    Reviewed chart for medication changes ahead of medication coordination call.  No OVs, Consults, or hospital visits since last care  coordination call/Pharmacist visit. (If appropriate, list visit date, provider name)  No medication changes indicated OR if recent visit, treatment plan here.  BP Readings from Last 3 Encounters:  11/26/20 (!) 123/56  10/15/20 117/78  08/28/20 (!) 148/69    Lab Results  Component Value Date   HGBA1C 4.5 (L) 08/15/2020     Patient obtains medications through Vials  30 Days   Last adherence delivery included:  atorvastatin (LIPITOR) 20 MG table  carvedilol (COREG) 25 MG tablet  leflunomide (ARAVA) 20 MG tablet   Patient is due for next adherence delivery on: 01/12/21.  Called patient and reviewed medications and coordinated delivery. This delivery to include: atorvastatin (LIPITOR) 20 MG table  carvedilol (COREG) 25 MG tablet  leflunomide (ARAVA) 20 MG tablet  furosemide (LASIX) 20 MG table  spironolactone (ALDACTONE) 25 MG tablet   Patient needs refills for Leflunomide 20 mg-CPP to request.  Confirmed delivery date of 01/12/21, advised patient that pharmacy will contact them the morning of delivery.   Star Rating Drugs: Telmisartan 40 mg Atorvastatin 20 mg   Ethelene Hal Clinical Pharmacist Assistant [phone]   Time spent:40

## 2021-01-16 ENCOUNTER — Other Ambulatory Visit: Payer: Medicare Other

## 2021-01-16 ENCOUNTER — Ambulatory Visit: Payer: Medicare Other

## 2021-01-16 ENCOUNTER — Ambulatory Visit: Payer: Medicare Other | Admitting: Family

## 2021-01-22 ENCOUNTER — Inpatient Hospital Stay: Payer: Medicare Other | Attending: Hematology & Oncology

## 2021-01-22 ENCOUNTER — Inpatient Hospital Stay (HOSPITAL_BASED_OUTPATIENT_CLINIC_OR_DEPARTMENT_OTHER): Payer: Medicare Other | Admitting: Family

## 2021-01-22 ENCOUNTER — Inpatient Hospital Stay: Payer: Medicare Other

## 2021-01-22 ENCOUNTER — Encounter: Payer: Self-pay | Admitting: Family

## 2021-01-22 ENCOUNTER — Other Ambulatory Visit: Payer: Self-pay

## 2021-01-22 ENCOUNTER — Telehealth: Payer: Self-pay | Admitting: *Deleted

## 2021-01-22 VITALS — BP 107/60 | HR 100 | Temp 98.9°F | Resp 20 | Wt 200.4 lb

## 2021-01-22 DIAGNOSIS — N182 Chronic kidney disease, stage 2 (mild): Secondary | ICD-10-CM | POA: Insufficient documentation

## 2021-01-22 DIAGNOSIS — D631 Anemia in chronic kidney disease: Secondary | ICD-10-CM

## 2021-01-22 DIAGNOSIS — D508 Other iron deficiency anemias: Secondary | ICD-10-CM

## 2021-01-22 DIAGNOSIS — D472 Monoclonal gammopathy: Secondary | ICD-10-CM

## 2021-01-22 DIAGNOSIS — E611 Iron deficiency: Secondary | ICD-10-CM | POA: Insufficient documentation

## 2021-01-22 LAB — CBC WITH DIFFERENTIAL (CANCER CENTER ONLY)
Abs Immature Granulocytes: 0.01 10*3/uL (ref 0.00–0.07)
Basophils Absolute: 0 10*3/uL (ref 0.0–0.1)
Basophils Relative: 1 %
Eosinophils Absolute: 0.1 10*3/uL (ref 0.0–0.5)
Eosinophils Relative: 3 %
HCT: 31.7 % — ABNORMAL LOW (ref 36.0–46.0)
Hemoglobin: 10.2 g/dL — ABNORMAL LOW (ref 12.0–15.0)
Immature Granulocytes: 0 %
Lymphocytes Relative: 49 %
Lymphs Abs: 2.1 10*3/uL (ref 0.7–4.0)
MCH: 31.5 pg (ref 26.0–34.0)
MCHC: 32.2 g/dL (ref 30.0–36.0)
MCV: 97.8 fL (ref 80.0–100.0)
Monocytes Absolute: 0.6 10*3/uL (ref 0.1–1.0)
Monocytes Relative: 13 %
Neutro Abs: 1.5 10*3/uL — ABNORMAL LOW (ref 1.7–7.7)
Neutrophils Relative %: 34 %
Platelet Count: 164 10*3/uL (ref 150–400)
RBC: 3.24 MIL/uL — ABNORMAL LOW (ref 3.87–5.11)
RDW: 13.7 % (ref 11.5–15.5)
WBC Count: 4.3 10*3/uL (ref 4.0–10.5)
nRBC: 0 % (ref 0.0–0.2)

## 2021-01-22 LAB — CMP (CANCER CENTER ONLY)
ALT: 15 U/L (ref 0–44)
AST: 17 U/L (ref 15–41)
Albumin: 4.2 g/dL (ref 3.5–5.0)
Alkaline Phosphatase: 72 U/L (ref 38–126)
Anion gap: 9 (ref 5–15)
BUN: 26 mg/dL — ABNORMAL HIGH (ref 8–23)
CO2: 27 mmol/L (ref 22–32)
Calcium: 9.6 mg/dL (ref 8.9–10.3)
Chloride: 110 mmol/L (ref 98–111)
Creatinine: 1.72 mg/dL — ABNORMAL HIGH (ref 0.44–1.00)
GFR, Estimated: 31 mL/min — ABNORMAL LOW (ref >60)
Glucose, Bld: 101 mg/dL — ABNORMAL HIGH (ref 70–99)
Potassium: 4.4 mmol/L (ref 3.5–5.1)
Sodium: 146 mmol/L — ABNORMAL HIGH (ref 135–145)
Total Bilirubin: 0.5 mg/dL (ref 0.3–1.2)
Total Protein: 7 g/dL (ref 6.5–8.1)

## 2021-01-22 LAB — RETICULOCYTES
Immature Retic Fract: 13.3 % (ref 2.3–15.9)
RBC.: 3.24 MIL/uL — ABNORMAL LOW (ref 3.87–5.11)
Retic Count, Absolute: 44.1 10*3/uL (ref 19.0–186.0)
Retic Ct Pct: 1.4 % (ref 0.4–3.1)

## 2021-01-22 MED ORDER — DARBEPOETIN ALFA 300 MCG/0.6ML IJ SOSY
300.0000 ug | PREFILLED_SYRINGE | Freq: Once | INTRAMUSCULAR | Status: AC
  Administered 2021-01-22: 300 ug via SUBCUTANEOUS
  Filled 2021-01-22: qty 0.6

## 2021-01-22 NOTE — Patient Instructions (Signed)
Darbepoetin Alfa injection What is this medication? DARBEPOETIN ALFA (dar be POE e tin AL fa) helps your body make more red blood cells. It is used to treat anemia caused by chronic kidney failure and chemotherapy. This medicine may be used for other purposes; ask your health care provider or pharmacist if you have questions. COMMON BRAND NAME(S): Aranesp What should I tell my care team before I take this medication? They need to know if you have any of these conditions: blood clotting disorders or history of blood clots cancer patient not on chemotherapy cystic fibrosis heart disease, such as angina, heart failure, or a history of a heart attack hemoglobin level of 12 g/dL or greater high blood pressure low levels of folate, iron, or vitamin B12 seizures an unusual or allergic reaction to darbepoetin, erythropoietin, albumin, hamster proteins, latex, other medicines, foods, dyes, or preservatives pregnant or trying to get pregnant breast-feeding How should I use this medication? This medicine is for injection into a vein or under the skin. It is usually given by a health care professional in a hospital or clinic setting. If you get this medicine at home, you will be taught how to prepare and give this medicine. Use exactly as directed. Take your medicine at regular intervals. Do not take your medicine more often than directed. It is important that you put your used needles and syringes in a special sharps container. Do not put them in a trash can. If you do not have a sharps container, call your pharmacist or healthcare provider to get one. A special MedGuide will be given to you by the pharmacist with each prescription and refill. Be sure to read this information carefully each time. Talk to your pediatrician regarding the use of this medicine in children. While this medicine may be used in children as young as 1 month of age for selected conditions, precautions do apply. Overdosage: If you  think you have taken too much of this medicine contact a poison control center or emergency room at once. NOTE: This medicine is only for you. Do not share this medicine with others. What if I miss a dose? If you miss a dose, take it as soon as you can. If it is almost time for your next dose, take only that dose. Do not take double or extra doses. What may interact with this medication? Do not take this medicine with any of the following medications: epoetin alfa This list may not describe all possible interactions. Give your health care provider a list of all the medicines, herbs, non-prescription drugs, or dietary supplements you use. Also tell them if you smoke, drink alcohol, or use illegal drugs. Some items may interact with your medicine. What should I watch for while using this medication? Your condition will be monitored carefully while you are receiving this medicine. You may need blood work done while you are taking this medicine. This medicine may cause a decrease in vitamin B6. You should make sure that you get enough vitamin B6 while you are taking this medicine. Discuss the foods you eat and the vitamins you take with your health care professional. What side effects may I notice from receiving this medication? Side effects that you should report to your doctor or health care professional as soon as possible: allergic reactions like skin rash, itching or hives, swelling of the face, lips, or tongue breathing problems changes in vision chest pain confusion, trouble speaking or understanding feeling faint or lightheaded, falls high blood pressure   muscle aches or pains pain, swelling, warmth in the leg rapid weight gain severe headaches sudden numbness or weakness of the face, arm or leg trouble walking, dizziness, loss of balance or coordination seizures (convulsions) swelling of the ankles, feet, hands unusually weak or tired Side effects that usually do not require medical  attention (report to your doctor or health care professional if they continue or are bothersome): diarrhea fever, chills (flu-like symptoms) headaches nausea, vomiting redness, stinging, or swelling at site where injected This list may not describe all possible side effects. Call your doctor for medical advice about side effects. You may report side effects to FDA at 1-800-FDA-1088. Where should I keep my medication? Keep out of the reach of children. Store in a refrigerator between 2 and 8 degrees C (36 and 46 degrees F). Do not freeze. Do not shake. Throw away any unused portion if using a single-dose vial. Throw away any unused medicine after the expiration date. NOTE: This sheet is a summary. It may not cover all possible information. If you have questions about this medicine, talk to your doctor, pharmacist, or health care provider.  2022 Elsevier/Gold Standard (2017-05-18 16:44:20)  

## 2021-01-22 NOTE — Progress Notes (Signed)
Hematology and Oncology Follow Up Visit  Tina Molina [id number] 1946/01/10 75 y.o. 01/22/2021   Principle Diagnosis:  Anemia of chronic renal failure - stage II MGUS - IgA lambda Iron deficiency anemia   Current Therapy:        Aranesp 300 mcg SQ to keep Hgb > 11 IV iron as indicated   Interim History:  Tina Molina is here today for follow-up. She is doing well but notes fatigue.  She has been having a lot of joint aches and pains especially in her hands due to arthritis.  No numbness or tingling in her extremities.  No falls or syncope to report.  She has maintained a good appetite and is staying well hydrated. Her weight is stable at 200 lbs.  No fever, chills, n/v, cough, rash, dizziness, SOB, chest pain, palpitations, abdominal pain or changes in bowel or bladder habits.  No episodes of bleeding. No bruising or petechiae.   ECOG Performance Status: 1 - Symptomatic but completely ambulatory  Medications:  Allergies as of 01/22/2021       Reactions   Penicillins Other (See Comments)   Intolerance or allergy not remembered; her sister stated penicillin caused "boils under the skin"   Shellfish Allergy Rash   Shellfish-derived Products Anaphylaxis   Codeine Nausea Only   Nausea only with liquid codeine   Infliximab Other (See Comments)    Confusion (intolerance) Knots under the skin   Sulfonamide Derivatives Rash   Joint pain   Iodine    Azithromycin Itching        Medication List        Accurate as of January 22, 2021  1:56 PM. If you have any questions, ask your nurse or doctor.          aspirin 81 MG tablet Take 81 mg by mouth daily.   atorvastatin 20 MG tablet Commonly known as: LIPITOR TAKE ONE TABLET BY MOUTH ONCE DAILY   budesonide-formoterol 160-4.5 MCG/ACT inhaler Commonly known as: Symbicort inhale 2 puffs if needed for wheezing   carvedilol 25 MG tablet Commonly known as: COREG TAKE ONE TABLET BY MOUTH TWICE DAILY   cyclobenzaprine 10  MG tablet Commonly known as: FLEXERIL Take 10 mg by mouth 3 (three) times daily as needed for muscle spasms.   famotidine 40 MG tablet Commonly known as: PEPCID TAKE ONE TABLET BY MOUTH TWICE DAILY   furosemide 20 MG tablet Commonly known as: LASIX TAKE ONE TABLET BY MOUTH DAILY   gabapentin 300 MG capsule Commonly known as: NEURONTIN TAKE ONE CAPSULE BY MOUTH ONCE DAILY   hydrALAZINE 50 MG tablet Commonly known as: APRESOLINE TAKE ONE TABLET BY MOUTH THREE TIMES DAILY   latanoprost 0.005 % ophthalmic solution Commonly known as: XALATAN 1 drop at bedtime.   leflunomide 20 MG tablet Commonly known as: ARAVA Take 20 mg by mouth daily.   levothyroxine 200 MCG tablet Commonly known as: SYNTHROID TAKE ONE TABLET BY MOUTH BEFORE BREAKFAST   MELATONIN GUMMIES PO Take by mouth at bedtime as needed.   multivitamin with minerals Tabs tablet Take 1 tablet by mouth daily.   spironolactone 25 MG tablet Commonly known as: ALDACTONE TAKE ONE TABLET BY MOUTH DAILY   telmisartan 40 MG tablet Commonly known as: MICARDIS TAKE ONE TABLET BY MOUTH DAILY   vitamin E 180 MG (400 UNITS) capsule Take 400 Units by mouth daily.        Allergies:  Allergies  Allergen Reactions   Penicillins Other (See Comments)  Intolerance or allergy not remembered; her sister stated penicillin caused "boils under the skin"   Shellfish Allergy Rash   Shellfish-Derived Products Anaphylaxis   Codeine Nausea Only    Nausea only with liquid codeine   Infliximab Other (See Comments)     Confusion (intolerance) Knots under the skin   Sulfonamide Derivatives Rash    Joint pain   Iodine    Azithromycin Itching    Past Medical History, Surgical history, Social history, and Family History were reviewed and updated.  Review of Systems: All other 10 point review of systems is negative.   Physical Exam:  weight is 200 lb 6.4 oz (90.9 kg). Her oral temperature is 98.9 F (37.2 C). Her blood  pressure is 107/60 and her pulse is 100. Her respiration is 20 and oxygen saturation is 97%.   Wt Readings from Last 3 Encounters:  01/22/21 200 lb 6.4 oz (90.9 kg)  10/15/20 200 lb 1.9 oz (90.8 kg)  08/15/20 205 lb (93 kg)    Ocular: Sclerae unicteric, pupils equal, round and reactive to light Ear-nose-throat: Oropharynx clear, dentition fair Lymphatic: No cervical or supraclavicular adenopathy Lungs no rales or rhonchi, good excursion bilaterally Heart regular rate and rhythm, no murmur appreciated Abd soft, nontender, positive bowel sounds MSK no focal spinal tenderness, no joint edema Neuro: non-focal, well-oriented, appropriate affect Breasts: Deferred   Lab Results  Component Value Date   WBC 4.3 01/22/2021   HGB 10.2 (L) 01/22/2021   HCT 31.7 (L) 01/22/2021   MCV 97.8 01/22/2021   PLT 164 01/22/2021   Lab Results  Component Value Date   FERRITIN 280 10/15/2020   IRON 145 (H) 10/15/2020   TIBC 269 10/15/2020   UIBC 124 10/15/2020   IRONPCTSAT 54 10/15/2020   Lab Results  Component Value Date   RETICCTPCT 1.4 01/22/2021   RBC 3.24 (L) 01/22/2021   RBC 3.24 (L) 01/22/2021   RETICCTABS 51.6 01/03/2013   Lab Results  Component Value Date   KPAFRELGTCHN 25.6 (H) 07/17/2020   LAMBDASER 40.5 (H) 07/17/2020   KAPLAMBRATIO 0.63 07/17/2020   Lab Results  Component Value Date   IGGSERUM 957 07/17/2020   IGA 569 (H) 07/17/2020   IGMSERUM 21 (L) 07/17/2020   Lab Results  Component Value Date   TOTALPROTELP 6.9 07/17/2020   ALBUMINELP 4.0 07/17/2020   A1GS 0.2 07/17/2020   A2GS 0.6 07/17/2020   BETS 1.3 07/17/2020   BETA2SER 10.3 (H) 11/04/2011   GAMS 0.8 07/17/2020   MSPIKE 0.2 (H) 07/17/2020   SPEI Comment 07/17/2020     Chemistry      Component Value Date/Time   NA 146 (H) 01/22/2021 1308   NA 150 (H) 03/01/2017 1308   NA 143 04/05/2016 1156   K 4.4 01/22/2021 1308   K 4.0 03/01/2017 1308   K 3.8 04/05/2016 1156   CL 110 01/22/2021 1308   CL 109  (H) 03/01/2017 1308   CO2 27 01/22/2021 1308   CO2 28 03/01/2017 1308   CO2 21 (L) 04/05/2016 1156   BUN 26 (H) 01/22/2021 1308   BUN 19 03/01/2017 1308   BUN 18.5 04/05/2016 1156   CREATININE 1.72 (H) 01/22/2021 1308   CREATININE 1.8 (H) 03/01/2017 1308   CREATININE 1.5 (H) 04/05/2016 1156      Component Value Date/Time   CALCIUM 9.6 01/22/2021 1308   CALCIUM 9.7 03/01/2017 1308   CALCIUM 9.3 04/05/2016 1156   ALKPHOS 72 01/22/2021 1308   ALKPHOS 55 03/01/2017 1308  ALKPHOS 77 04/05/2016 1156   AST 17 01/22/2021 1308   AST 21 04/05/2016 1156   ALT 15 01/22/2021 1308   ALT 18 03/01/2017 1308   ALT 22 04/05/2016 1156   BILITOT 0.5 01/22/2021 1308   BILITOT 0.36 04/05/2016 1156       Impression and Plan: Tina Molina is a very pleasant 75 yo African American female with multifactorial anemia as well as an IgA lambda MGUS.  ESA given for Hgb 10.4.  Iron studies are pending. We will replace if needed.  Lab and injection in 6 weeks.  Follow-up in 3 months.  She can contact our office with any questions or concerns.   Laverna Peace, NP 9/8/20221:56 PM

## 2021-01-22 NOTE — Telephone Encounter (Signed)
Per 01/22/21 los gave upcoming appointments - confirmed

## 2021-01-23 LAB — FERRITIN: Ferritin: 233 ng/mL (ref 11–307)

## 2021-01-23 LAB — IRON AND TIBC
Iron: 99 ug/dL (ref 41–142)
Saturation Ratios: 36 % (ref 21–57)
TIBC: 276 ug/dL (ref 236–444)
UIBC: 177 ug/dL (ref 120–384)

## 2021-01-27 DIAGNOSIS — M0589 Other rheumatoid arthritis with rheumatoid factor of multiple sites: Secondary | ICD-10-CM | POA: Diagnosis not present

## 2021-01-27 DIAGNOSIS — Z79899 Other long term (current) drug therapy: Secondary | ICD-10-CM | POA: Diagnosis not present

## 2021-02-04 DIAGNOSIS — I129 Hypertensive chronic kidney disease with stage 1 through stage 4 chronic kidney disease, or unspecified chronic kidney disease: Secondary | ICD-10-CM | POA: Diagnosis not present

## 2021-02-04 DIAGNOSIS — N1832 Chronic kidney disease, stage 3b: Secondary | ICD-10-CM | POA: Diagnosis not present

## 2021-02-04 DIAGNOSIS — D860 Sarcoidosis of lung: Secondary | ICD-10-CM | POA: Diagnosis not present

## 2021-02-04 DIAGNOSIS — M069 Rheumatoid arthritis, unspecified: Secondary | ICD-10-CM | POA: Diagnosis not present

## 2021-02-04 DIAGNOSIS — D472 Monoclonal gammopathy: Secondary | ICD-10-CM | POA: Diagnosis not present

## 2021-02-04 DIAGNOSIS — D631 Anemia in chronic kidney disease: Secondary | ICD-10-CM | POA: Diagnosis not present

## 2021-02-11 ENCOUNTER — Other Ambulatory Visit: Payer: Self-pay | Admitting: Internal Medicine

## 2021-02-15 ENCOUNTER — Encounter: Payer: Self-pay | Admitting: Internal Medicine

## 2021-02-15 NOTE — Patient Instructions (Addendum)
    Flu immunization administered today.      Medications changes include :   increase gabapentin to 300 mg three times a day   Your prescription(s) have been submitted to your pharmacy. Please take as directed and contact our office if you believe you are having problem(s) with the medication(s).    Please followup in 6 months

## 2021-02-15 NOTE — Progress Notes (Signed)
Subjective:    Patient ID: Tina Molina, female    DOB: 1945-09-10, 75 y.o.   MRN: [id number]  This visit occurred during the SARS-CoV-2 public health emergency.  Safety protocols were in place, including screening questions prior to the visit, additional usage of staff PPE, and extensive cleaning of exam room while observing appropriate contact time as indicated for disinfecting solutions.     HPI The patient is here for follow up of their chronic medical problems, including htn, hypothyroidism, hld, hyperglycemia, GERD, CKD  She is not exercising regularly.     She has no concerns.    Medications and allergies reviewed with patient and updated if appropriate.  Patient Active Problem List   Diagnosis Date Noted   Nocturia more than twice per night 10/25/2017   Insomnia secondary to chronic pain 10/25/2017   Upper back pain on left side 04/20/2017   IDA (iron deficiency anemia) 04/06/2016   Erythropoietin deficiency anemia 02/23/2016   GERD (gastroesophageal reflux disease) 06/25/2015   Hyperlipidemia 01/13/2015   Hyperglycemia 01/13/2015   Rheumatoid factor positive 02/20/2014   Thoracic radiculopathy 10/30/2013   Circadian rhythm sleep disorder, shift work type 11/03/2012   Hematuria 08/05/2012   MGUS (monoclonal gammopathy of unknown significance) 08/03/2012   Lumbar stenosis with neurogenic claudication 02/29/2012   Edema 09/13/2011   Chronic kidney disease, stage IV (severe) ([place]) 12/19/2009   HERNIATED LUMBAR DISC 12/19/2009   PULMONARY SARCOIDOSIS 10/16/2009   Hypothyroidism 10/16/2009   Anemia associated with stage 2 chronic renal failure 10/16/2009   CARPAL TUNNEL SYNDROME, BILATERAL 10/16/2009   UVEITIS 10/16/2009   Essential hypertension 10/16/2009   ARTHRITIS 10/16/2009   CERVICAL DISC DISORDER 10/16/2009    Current Outpatient Medications on File Prior to Visit  Medication Sig Dispense Refill   aspirin 81 MG tablet Take 81 mg by mouth daily.      atorvastatin (LIPITOR) 20 MG tablet TAKE ONE TABLET BY MOUTH ONCE DAILY 90 tablet 1   budesonide-formoterol (SYMBICORT) 160-4.5 MCG/ACT inhaler inhale 2 puffs if needed for wheezing 10.2 g 0   carvedilol (COREG) 25 MG tablet TAKE ONE TABLET BY MOUTH TWICE DAILY 180 tablet 1   cyclobenzaprine (FLEXERIL) 10 MG tablet Take 10 mg by mouth 3 (three) times daily as needed for muscle spasms.     famotidine (PEPCID) 40 MG tablet TAKE ONE TABLET BY MOUTH TWICE DAILY 180 tablet 1   furosemide (LASIX) 20 MG tablet TAKE ONE TABLET BY MOUTH DAILY 90 tablet 0   gabapentin (NEURONTIN) 300 MG capsule TAKE ONE CAPSULE BY MOUTH ONCE DAILY 90 capsule 1   hydrALAZINE (APRESOLINE) 50 MG tablet TAKE ONE TABLET BY MOUTH THREE TIMES DAILY 270 tablet 1   latanoprost (XALATAN) 0.005 % ophthalmic solution 1 drop at bedtime.     leflunomide (ARAVA) 20 MG tablet Take 20 mg by mouth daily. (Patient not taking: Reported on 01/22/2021)     levothyroxine (SYNTHROID) 200 MCG tablet TAKE ONE TABLET BY MOUTH BEFORE BREAKFAST 90 tablet 1   MELATONIN GUMMIES PO Take by mouth at bedtime as needed.     Multiple Vitamin (MULTIVITAMIN WITH MINERALS) TABS Take 1 tablet by mouth daily.     spironolactone (ALDACTONE) 25 MG tablet TAKE ONE TABLET BY MOUTH DAILY 90 tablet 1   telmisartan (MICARDIS) 40 MG tablet TAKE ONE TABLET BY MOUTH DAILY 90 tablet 1   vitamin E 400 UNIT capsule Take 400 Units by mouth daily.      [DISCONTINUED] modafinil (PROVIGIL)  100 MG tablet Take 100 mg by mouth daily.     [DISCONTINUED] potassium chloride (MICRO-K) 10 MEQ CR capsule Take 1 capsule (10 mEq total) by mouth 2 (two) times daily. 180 capsule 1   No current facility-administered medications on file prior to visit.    Past Medical History:  Diagnosis Date   Anemia    Associated with leukopenia and lymphocytosis. This is been evaluated by Dr. Marin Olp . Her platelet count has been normal   Arthritis    Chronic back pain    Chronic kidney disease     Erythropoietin deficiency anemia 02/23/2016   GERD (gastroesophageal reflux disease)    Hypertension    Lymphocytosis    MGUS (monoclonal gammopathy of unknown significance) 08/03/2012   IgA 792 11/04/11   Pneumonia 04/2010   OP   Renal insufficiency    Sarcoidosis    Thyroid disease     Past Surgical History:  Procedure Laterality Date   CARPAL TUNNEL RELEASE     Bilateral    CATARACT EXTRACTION Right 05/2015   CATARACT EXTRACTION Left    CERVICAL DISCECTOMY  06/2010   Dr.Pool   CHOLECYSTECTOMY     COLONOSCOPY  2010   POSTERIOR LUMBAR FUSION  02/29/2012   Dr Alyson Locket op respiratory & renal compromise   THYROIDECTOMY  2003   For goiter   UPPER GASTROINTESTINAL ENDOSCOPY  12/01/2011   DB    Social History   Socioeconomic History   Marital status: Divorced    Spouse name: Not on file   Number of children: 0   Years of education: 12   Highest education level: Not on file  Occupational History   Occupation: Retired    Fish farm manager: RETIRED  Tobacco Use   Smoking status: Former    Packs/day: 0.50    Years: 20.00    Pack years: 10.00    Types: Cigarettes    Quit date: 05/17/1997    Years since quitting: 23.7   Smokeless tobacco: Never  Vaping Use   Vaping Use: Never used  Substance and Sexual Activity   Alcohol use: Yes    Alcohol/week: 1.0 standard drink    Types: 1 Glasses of wine per week    Comment: occasionally , less than weekly   Drug use: No   Sexual activity: Not Currently  Other Topics Concern   Not on file  Social History Narrative   Patient is divorced and lives with her sister. Patient is retired.   Caffeine- sometimes - one cup   Right handed.   Social Determinants of Health   Financial Resource Strain: Medium Risk   Difficulty of Paying Living Expenses: Somewhat hard  Food Insecurity: Not on file  Transportation Needs: Not on file  Physical Activity: Not on file  Stress: Not on file  Social Connections: Not on file    Family History   Problem Relation Age of Onset   Anemia Father    Arthritis Father    Heart failure Father 92       No CAD   Diabetes Mother    Arthritis Mother    Glaucoma Mother    Heart disease Mother 59       No CAD   Hypertension Brother    Anemia Brother    Kidney disease Brother    Hypertension Sister    Kidney disease Sister    Anemia Sister    Diabetes Maternal Aunt    Arthritis Paternal Grandmother    Colon cancer Neg  Hx    Colon polyps Neg Hx    Esophageal cancer Neg Hx    Rectal cancer Neg Hx    Stomach cancer Neg Hx     Review of Systems  Constitutional:  Positive for fatigue. Negative for chills and fever.  Respiratory:  Positive for shortness of breath (with moderate exercise - slightly worse) and wheezing (a little at night when she lays down). Negative for cough.   Cardiovascular:  Positive for leg swelling (mild). Negative for chest pain and palpitations.  Neurological:  Negative for dizziness, light-headedness and headaches.      Objective:  There were no vitals filed for this visit. BP Readings from Last 3 Encounters:  01/22/21 107/60  11/26/20 (!) 123/56  10/15/20 117/78   Wt Readings from Last 3 Encounters:  01/22/21 200 lb 6.4 oz (90.9 kg)  10/15/20 200 lb 1.9 oz (90.8 kg)  08/15/20 205 lb (93 kg)   There is no height or weight on file to calculate BMI.   Physical Exam    Constitutional: Appears well-developed and well-nourished. No distress.  HENT:  Head: Normocephalic and atraumatic.  Neck: Neck supple. No tracheal deviation present. No thyromegaly present.  No cervical lymphadenopathy Cardiovascular: Normal rate, regular rhythm and normal heart sounds.   No murmur heard. No carotid bruit .  No edema Pulmonary/Chest: Effort normal and breath sounds normal. No respiratory distress. No has no wheezes. No rales.  Skin: Skin is warm and dry. Not diaphoretic.  Psychiatric: Normal mood and affect. Behavior is normal.      Assessment & Plan:    See  Problem List for Assessment and Plan of chronic medical problems.

## 2021-02-16 ENCOUNTER — Other Ambulatory Visit: Payer: Self-pay

## 2021-02-16 ENCOUNTER — Ambulatory Visit (INDEPENDENT_AMBULATORY_CARE_PROVIDER_SITE_OTHER): Payer: Medicare Other | Admitting: Internal Medicine

## 2021-02-16 VITALS — BP 126/74 | HR 87 | Temp 98.1°F | Wt 199.0 lb

## 2021-02-16 DIAGNOSIS — M48062 Spinal stenosis, lumbar region with neurogenic claudication: Secondary | ICD-10-CM

## 2021-02-16 DIAGNOSIS — E7849 Other hyperlipidemia: Secondary | ICD-10-CM | POA: Diagnosis not present

## 2021-02-16 DIAGNOSIS — R739 Hyperglycemia, unspecified: Secondary | ICD-10-CM

## 2021-02-16 DIAGNOSIS — N184 Chronic kidney disease, stage 4 (severe): Secondary | ICD-10-CM | POA: Diagnosis not present

## 2021-02-16 DIAGNOSIS — I10 Essential (primary) hypertension: Secondary | ICD-10-CM | POA: Diagnosis not present

## 2021-02-16 DIAGNOSIS — E890 Postprocedural hypothyroidism: Secondary | ICD-10-CM

## 2021-02-16 DIAGNOSIS — Z23 Encounter for immunization: Secondary | ICD-10-CM

## 2021-02-16 DIAGNOSIS — R768 Other specified abnormal immunological findings in serum: Secondary | ICD-10-CM

## 2021-02-16 DIAGNOSIS — K219 Gastro-esophageal reflux disease without esophagitis: Secondary | ICD-10-CM

## 2021-02-16 MED ORDER — GABAPENTIN 300 MG PO CAPS: 300.0000 mg | ORAL_CAPSULE | Freq: Three times a day (TID) | ORAL | 1 refills | Status: DC

## 2021-02-16 MED ORDER — CYCLOBENZAPRINE HCL 10 MG PO TABS: 10.0000 mg | ORAL_TABLET | Freq: Three times a day (TID) | ORAL | 1 refills | Status: DC

## 2021-02-16 NOTE — Addendum Note (Signed)
Addended by: Marcina Millard on: 02/16/2021 04:54 PM   Modules accepted: Orders

## 2021-02-16 NOTE — Assessment & Plan Note (Addendum)
Chronic Has associated muscle spasms Limits her activity level Dr Annette Stable  Taking flexeril twice-3 times daily-I will refill Taking gabapentin once a day - will increase to 2-3 times a day as needed

## 2021-02-16 NOTE — Assessment & Plan Note (Signed)
Chronic °Regular exercise and healthy diet encouraged °Check lipid panel  °Continue atorvastatin 20 mg daily °

## 2021-02-16 NOTE — Assessment & Plan Note (Signed)
Chronic On leflunomide

## 2021-02-16 NOTE — Assessment & Plan Note (Signed)
Chronic GERD controlled Continue pepcid 40 mg bid

## 2021-02-16 NOTE — Assessment & Plan Note (Signed)
Chronic Sugars have been stable Will recheck A1c at her next visit

## 2021-02-16 NOTE — Assessment & Plan Note (Signed)
Chronic  Clinically euthyroid Currently taking levothyroxine 200 mcg daily

## 2021-02-16 NOTE — Assessment & Plan Note (Signed)
Chronic Stable Has been following with [facility] Reviewed recent blood work

## 2021-02-16 NOTE — Assessment & Plan Note (Addendum)
Chronic BP well controlled Continue carvedilol 25 mg twice daily, hydralazine 50 mg 3 times daily, spironolactone 25 mg daily, telmisartan 40 mg daily

## 2021-02-17 DIAGNOSIS — H40053 Ocular hypertension, bilateral: Secondary | ICD-10-CM | POA: Diagnosis not present

## 2021-02-17 DIAGNOSIS — Z23 Encounter for immunization: Secondary | ICD-10-CM | POA: Diagnosis not present

## 2021-02-19 ENCOUNTER — Telehealth: Payer: Self-pay | Admitting: Pharmacist

## 2021-02-19 NOTE — Progress Notes (Signed)
    Chronic Care Management Pharmacy Assistant   Name: Tina Molina  MRN: [id number] DOB: [birth year]   Reason for Encounter: Medication Review   Medications: Outpatient Encounter Medications as of 02/19/2021  Medication Sig   aspirin 81 MG tablet Take 81 mg by mouth daily.   atorvastatin (LIPITOR) 20 MG tablet TAKE ONE TABLET BY MOUTH ONCE DAILY   budesonide-formoterol (SYMBICORT) 160-4.5 MCG/ACT inhaler inhale 2 puffs if needed for wheezing   carvedilol (COREG) 25 MG tablet TAKE ONE TABLET BY MOUTH TWICE DAILY   cyclobenzaprine (FLEXERIL) 10 MG tablet Take 1 tablet (10 mg total) by mouth 3 (three) times daily.   famotidine (PEPCID) 40 MG tablet TAKE ONE TABLET BY MOUTH TWICE DAILY   furosemide (LASIX) 20 MG tablet TAKE ONE TABLET BY MOUTH DAILY   gabapentin (NEURONTIN) 300 MG capsule Take 1 capsule (300 mg total) by mouth 3 (three) times daily. TAKE ONE CAPSULE BY MOUTH ONCE DAILY   hydrALAZINE (APRESOLINE) 50 MG tablet TAKE ONE TABLET BY MOUTH THREE TIMES DAILY   latanoprost (XALATAN) 0.005 % ophthalmic solution 1 drop at bedtime.   leflunomide (ARAVA) 20 MG tablet Take 20 mg by mouth daily.   levothyroxine (SYNTHROID) 200 MCG tablet TAKE ONE TABLET BY MOUTH BEFORE BREAKFAST   MELATONIN GUMMIES PO Take by mouth at bedtime as needed.   Multiple Vitamin (MULTIVITAMIN WITH MINERALS) TABS Take 1 tablet by mouth daily.   spironolactone (ALDACTONE) 25 MG tablet TAKE ONE TABLET BY MOUTH DAILY   telmisartan (MICARDIS) 40 MG tablet TAKE ONE TABLET BY MOUTH DAILY   vitamin E 180 MG (400 UNITS) capsule 1 capsule   vitamin E 400 UNIT capsule Take 400 Units by mouth daily.    [DISCONTINUED] modafinil (PROVIGIL) 100 MG tablet Take 100 mg by mouth daily.   [DISCONTINUED] potassium chloride (MICRO-K) 10 MEQ CR capsule Take 1 capsule (10 mEq total) by mouth 2 (two) times daily.   No facility-administered encounter medications on file as of 02/19/2021.   Received call from patient regarding  medication management via [facility].  Patient requested an acute fill for Gabapentin, cyclobenzaprine, and Latanoprost to be delivered: 02/19/21 Pharmacy needs refills? Yes, latanoprost  Confirmed delivery date of 02/19/21, advised patient that pharmacy will contact them the morning of delivery.  [place] Pharmacist Assistant [phone]

## 2021-02-26 ENCOUNTER — Other Ambulatory Visit: Payer: Self-pay | Admitting: Internal Medicine

## 2021-02-26 DIAGNOSIS — I10 Essential (primary) hypertension: Secondary | ICD-10-CM

## 2021-03-02 NOTE — Progress Notes (Signed)
Chronic Care Management Pharmacy Assistant   Name: MADELLINE ESHBACH  MRN: [id number] DOB: [birth year]   Reason for Encounter: Medication Review    Recent office visits:  None ID  Recent consult visits:  None ID  Hospital visits:  None in previous 6 months  Medications: Outpatient Encounter Medications as of 02/19/2021  Medication Sig   aspirin 81 MG tablet Take 81 mg by mouth daily.   atorvastatin (LIPITOR) 20 MG tablet TAKE ONE TABLET BY MOUTH ONCE DAILY   budesonide-formoterol (SYMBICORT) 160-4.5 MCG/ACT inhaler inhale 2 puffs if needed for wheezing   carvedilol (COREG) 25 MG tablet TAKE ONE TABLET BY MOUTH TWICE DAILY   cyclobenzaprine (FLEXERIL) 10 MG tablet Take 1 tablet (10 mg total) by mouth 3 (three) times daily.   famotidine (PEPCID) 40 MG tablet TAKE ONE TABLET BY MOUTH TWICE DAILY   gabapentin (NEURONTIN) 300 MG capsule Take 1 capsule (300 mg total) by mouth 3 (three) times daily. TAKE ONE CAPSULE BY MOUTH ONCE DAILY   latanoprost (XALATAN) 0.005 % ophthalmic solution 1 drop at bedtime.   leflunomide (ARAVA) 20 MG tablet Take 20 mg by mouth daily.   MELATONIN GUMMIES PO Take by mouth at bedtime as needed.   Multiple Vitamin (MULTIVITAMIN WITH MINERALS) TABS Take 1 tablet by mouth daily.   spironolactone (ALDACTONE) 25 MG tablet TAKE ONE TABLET BY MOUTH DAILY   telmisartan (MICARDIS) 40 MG tablet TAKE ONE TABLET BY MOUTH DAILY   vitamin E 180 MG (400 UNITS) capsule 1 capsule   vitamin E 400 UNIT capsule Take 400 Units by mouth daily.    [DISCONTINUED] furosemide (LASIX) 20 MG tablet TAKE ONE TABLET BY MOUTH DAILY   [DISCONTINUED] hydrALAZINE (APRESOLINE) 50 MG tablet TAKE ONE TABLET BY MOUTH THREE TIMES DAILY   [DISCONTINUED] levothyroxine (SYNTHROID) 200 MCG tablet TAKE ONE TABLET BY MOUTH BEFORE BREAKFAST   [DISCONTINUED] modafinil (PROVIGIL) 100 MG tablet Take 100 mg by mouth daily.   [DISCONTINUED] potassium chloride (MICRO-K) 10 MEQ CR capsule Take 1 capsule  (10 mEq total) by mouth 2 (two) times daily.   No facility-administered encounter medications on file as of 02/19/2021.    BP Readings from Last 3 Encounters:  02/16/21 126/74  01/22/21 107/60  11/26/20 (!) 123/56    Lab Results  Component Value Date   HGBA1C 4.5 (L) 08/15/2020      Last adherence delivery date:12/08/20      Patient is due for next adherence delivery on: 03/12/21 atorvastatin (LIPITOR) 20 MG table  carvedilol (COREG) 25 MG tablet  leflunomide (ARAVA) 20 MG tablet  furosemide (LASIX) 20 MG table  spironolactone (ALDACTONE) 25 MG tablet  Famotidine 40 mg 2 tabs daily Telmisartan 40 mg 1 tab daily Gabapentin 300 mg 1 tab 3x daily Cyclobenzaprine 10 mg 1 tab 3x daily Hydralazine 50 mg 1 tab 3x daily Levothyroxine 200 mcg 1 tab daily  Spoke with patient on 03/02/21 reviewed medications and coordinated delivery.  This delivery to include: Vials  90 Days    Patient declined the following medications this month: None   Any concerns about your medications? No  How often do you forget or accidentally miss a dose? Rarely  Do you use a pillbox? Yes  Is patient in packaging No    Refills requested from providers include:furosemide-cpp to request  Confirmed delivery date of 03/12/21, advised patient that pharmacy will contact them the morning of delivery.    Annual wellness visit in last year? Yes,02/16/21 Most Recent BP reading:126/74  02/16/21   Star Rating Drugs: Atorvastatin 20 mg Telmisartan 40 mg  Ethelene Hal Clinical Pharmacist Assistant [phone]

## 2021-03-14 ENCOUNTER — Other Ambulatory Visit: Payer: Self-pay | Admitting: Internal Medicine

## 2021-04-06 DIAGNOSIS — E669 Obesity, unspecified: Secondary | ICD-10-CM | POA: Diagnosis not present

## 2021-04-06 DIAGNOSIS — M5136 Other intervertebral disc degeneration, lumbar region: Secondary | ICD-10-CM | POA: Diagnosis not present

## 2021-04-06 DIAGNOSIS — D860 Sarcoidosis of lung: Secondary | ICD-10-CM | POA: Diagnosis not present

## 2021-04-06 DIAGNOSIS — N184 Chronic kidney disease, stage 4 (severe): Secondary | ICD-10-CM | POA: Diagnosis not present

## 2021-04-06 DIAGNOSIS — M25512 Pain in left shoulder: Secondary | ICD-10-CM | POA: Diagnosis not present

## 2021-04-06 DIAGNOSIS — M150 Primary generalized (osteo)arthritis: Secondary | ICD-10-CM | POA: Diagnosis not present

## 2021-04-06 DIAGNOSIS — Z6834 Body mass index (BMI) 34.0-34.9, adult: Secondary | ICD-10-CM | POA: Diagnosis not present

## 2021-04-06 DIAGNOSIS — M0589 Other rheumatoid arthritis with rheumatoid factor of multiple sites: Secondary | ICD-10-CM | POA: Diagnosis not present

## 2021-04-24 ENCOUNTER — Encounter: Payer: Self-pay | Admitting: Family

## 2021-04-24 ENCOUNTER — Inpatient Hospital Stay: Payer: Medicare Other | Attending: Hematology & Oncology

## 2021-04-24 ENCOUNTER — Inpatient Hospital Stay: Payer: Medicare Other

## 2021-04-24 ENCOUNTER — Telehealth: Payer: Self-pay | Admitting: *Deleted

## 2021-04-24 ENCOUNTER — Other Ambulatory Visit: Payer: Self-pay

## 2021-04-24 ENCOUNTER — Inpatient Hospital Stay (HOSPITAL_BASED_OUTPATIENT_CLINIC_OR_DEPARTMENT_OTHER): Payer: Medicare Other | Admitting: Family

## 2021-04-24 VITALS — BP 157/76 | HR 92 | Temp 98.2°F | Resp 17 | Wt 200.0 lb

## 2021-04-24 DIAGNOSIS — D508 Other iron deficiency anemias: Secondary | ICD-10-CM | POA: Diagnosis not present

## 2021-04-24 DIAGNOSIS — N184 Chronic kidney disease, stage 4 (severe): Secondary | ICD-10-CM | POA: Diagnosis present

## 2021-04-24 DIAGNOSIS — D472 Monoclonal gammopathy: Secondary | ICD-10-CM

## 2021-04-24 DIAGNOSIS — N182 Chronic kidney disease, stage 2 (mild): Secondary | ICD-10-CM | POA: Insufficient documentation

## 2021-04-24 DIAGNOSIS — D631 Anemia in chronic kidney disease: Secondary | ICD-10-CM

## 2021-04-24 LAB — CMP (CANCER CENTER ONLY)
ALT: 12 U/L (ref 0–44)
AST: 15 U/L (ref 15–41)
Albumin: 4.2 g/dL (ref 3.5–5.0)
Alkaline Phosphatase: 78 U/L (ref 38–126)
Anion gap: 9 (ref 5–15)
BUN: 32 mg/dL — ABNORMAL HIGH (ref 8–23)
CO2: 24 mmol/L (ref 22–32)
Calcium: 9.8 mg/dL (ref 8.9–10.3)
Chloride: 107 mmol/L (ref 98–111)
Creatinine: 1.7 mg/dL — ABNORMAL HIGH (ref 0.44–1.00)
GFR, Estimated: 31 mL/min — ABNORMAL LOW (ref >60)
Glucose, Bld: 113 mg/dL — ABNORMAL HIGH (ref 70–99)
Potassium: 4 mmol/L (ref 3.5–5.1)
Sodium: 140 mmol/L (ref 135–145)
Total Bilirubin: 0.3 mg/dL (ref 0.3–1.2)
Total Protein: 7.1 g/dL (ref 6.5–8.1)

## 2021-04-24 LAB — CBC WITH DIFFERENTIAL (CANCER CENTER ONLY)
Abs Immature Granulocytes: 0.01 10*3/uL (ref 0.00–0.07)
Basophils Absolute: 0 10*3/uL (ref 0.0–0.1)
Basophils Relative: 1 %
Eosinophils Absolute: 0.2 10*3/uL (ref 0.0–0.5)
Eosinophils Relative: 3 %
HCT: 29.5 % — ABNORMAL LOW (ref 36.0–46.0)
Hemoglobin: 9.5 g/dL — ABNORMAL LOW (ref 12.0–15.0)
Immature Granulocytes: 0 %
Lymphocytes Relative: 41 %
Lymphs Abs: 2.2 10*3/uL (ref 0.7–4.0)
MCH: 31.1 pg (ref 26.0–34.0)
MCHC: 32.2 g/dL (ref 30.0–36.0)
MCV: 96.7 fL (ref 80.0–100.0)
Monocytes Absolute: 0.7 10*3/uL (ref 0.1–1.0)
Monocytes Relative: 12 %
Neutro Abs: 2.4 10*3/uL (ref 1.7–7.7)
Neutrophils Relative %: 43 %
Platelet Count: 193 10*3/uL (ref 150–400)
RBC: 3.05 MIL/uL — ABNORMAL LOW (ref 3.87–5.11)
RDW: 14.3 % (ref 11.5–15.5)
WBC Count: 5.4 10*3/uL (ref 4.0–10.5)
nRBC: 0 % (ref 0.0–0.2)

## 2021-04-24 LAB — RETICULOCYTES
Immature Retic Fract: 12.3 % (ref 2.3–15.9)
RBC.: 3.07 MIL/uL — ABNORMAL LOW (ref 3.87–5.11)
Retic Count, Absolute: 48.2 10*3/uL (ref 19.0–186.0)
Retic Ct Pct: 1.6 % (ref 0.4–3.1)

## 2021-04-24 MED ORDER — DARBEPOETIN ALFA 300 MCG/0.6ML IJ SOSY
300.0000 ug | PREFILLED_SYRINGE | Freq: Once | INTRAMUSCULAR | Status: AC
  Administered 2021-04-24: 300 ug via SUBCUTANEOUS
  Filled 2021-04-24: qty 0.6

## 2021-04-24 NOTE — Telephone Encounter (Signed)
Per 04/24/21 los - gave upcoming appointments - confirmed

## 2021-04-24 NOTE — Progress Notes (Signed)
Hematology and Oncology Follow Up Visit  Tina Molina [id number] 04-May-1946 75 y.o. 04/24/2021   Principle Diagnosis:  Anemia of chronic renal failure - stage II MGUS - IgA lambda Iron deficiency anemia   Current Therapy:        Aranesp 300 mcg SQ to keep Hgb > 11 IV iron as indicated   Interim History:  Tina Molina is here today for follow-up and ESA injection. She notes fatigue all the time.  She has mild SOB with over exertion and notes occasional palpitations when laying in bed.  No fever, chills, n/v, cough, rash, dizziness, chest pain, abdominal pain or changes in bowel or bladder habits.  No blood loss noted. No abnormal bruising, no petechiae.  No swelling in her extremities.  The neuropathy in her fingertips and occasionally her toes is unchanged from baseline.  No syncope. She has slid out of bed a couple of times since we last saw her and states that she is still a little sore in her right lower back. Thankfully she states that she was not seriously injured.  She is eating well and is doing her best to stay well hydrated. Her weight is stable at 200 lbs.   ECOG Performance Status: 1 - Symptomatic but completely ambulatory  Medications:  Allergies as of 04/24/2021       Reactions   Penicillins Other (See Comments)   Intolerance or allergy not remembered; her sister stated penicillin caused "boils under the skin"   Shellfish Allergy Rash   Shellfish-derived Products Anaphylaxis   Codeine Nausea Only   Nausea only with liquid codeine   Infliximab Other (See Comments)    Confusion (intolerance) Knots under the skin   Sulfonamide Derivatives Rash   Joint pain   Iodine    Azithromycin Itching        Medication List        Accurate as of April 24, 2021  2:21 PM. If you have any questions, ask your nurse or doctor.          aspirin 81 MG tablet Take 81 mg by mouth daily.   atorvastatin 20 MG tablet Commonly known as: LIPITOR TAKE ONE TABLET BY MOUTH  ONCE DAILY   budesonide-formoterol 160-4.5 MCG/ACT inhaler Commonly known as: Symbicort inhale 2 puffs if needed for wheezing   carvedilol 25 MG tablet Commonly known as: COREG TAKE ONE TABLET BY MOUTH TWICE DAILY   cyclobenzaprine 10 MG tablet Commonly known as: FLEXERIL Take 1 tablet (10 mg total) by mouth 3 (three) times daily.   famotidine 40 MG tablet Commonly known as: PEPCID TAKE ONE TABLET BY MOUTH TWICE DAILY   furosemide 20 MG tablet Commonly known as: LASIX TAKE ONE TABLET BY MOUTH ONCE DAILY   gabapentin 300 MG capsule Commonly known as: NEURONTIN Take 1 capsule (300 mg total) by mouth 3 (three) times daily. TAKE ONE CAPSULE BY MOUTH ONCE DAILY   hydrALAZINE 50 MG tablet Commonly known as: APRESOLINE TAKE ONE TABLET BY MOUTH THREE TIMES DAILY   latanoprost 0.005 % ophthalmic solution Commonly known as: XALATAN 1 drop at bedtime.   leflunomide 20 MG tablet Commonly known as: ARAVA Take 20 mg by mouth daily.   levothyroxine 200 MCG tablet Commonly known as: SYNTHROID TAKE ONE TABLET BY MOUTH BEFORE BREAKFAST   MELATONIN GUMMIES PO Take by mouth at bedtime as needed.   multivitamin with minerals Tabs tablet Take 1 tablet by mouth daily.   spironolactone 25 MG tablet Commonly known as: ALDACTONE  TAKE ONE TABLET BY MOUTH DAILY   telmisartan 40 MG tablet Commonly known as: MICARDIS TAKE ONE TABLET BY MOUTH DAILY   vitamin E 180 MG (400 UNITS) capsule Take 400 Units by mouth daily.   vitamin E 180 MG (400 UNITS) capsule 1 capsule        Allergies:  Allergies  Allergen Reactions   Penicillins Other (See Comments)    Intolerance or allergy not remembered; her sister stated penicillin caused "boils under the skin"   Shellfish Allergy Rash   Shellfish-Derived Products Anaphylaxis   Codeine Nausea Only    Nausea only with liquid codeine   Infliximab Other (See Comments)     Confusion (intolerance) Knots under the skin   Sulfonamide  Derivatives Rash    Joint pain   Iodine    Azithromycin Itching    Past Medical History, Surgical history, Social history, and Family History were reviewed and updated.  Review of Systems: All other 10 point review of systems is negative.   Physical Exam:  weight is 200 lb (90.7 kg). Her oral temperature is 98.2 F (36.8 C). Her blood pressure is 157/76 (abnormal) and her pulse is 92. Her respiration is 17 and oxygen saturation is 99%.   Wt Readings from Last 3 Encounters:  04/24/21 200 lb (90.7 kg)  02/16/21 199 lb (90.3 kg)  01/22/21 200 lb 6.4 oz (90.9 kg)    Ocular: Sclerae unicteric, pupils equal, round and reactive to light Ear-nose-throat: Oropharynx clear, dentition fair Lymphatic: No cervical or supraclavicular adenopathy Lungs no rales or rhonchi, good excursion bilaterally Heart regular rate and rhythm, no murmur appreciated Abd soft, nontender, positive bowel sounds MSK no focal spinal tenderness, no joint edema Neuro: non-focal, well-oriented, appropriate affect Breasts: Deferred   Lab Results  Component Value Date   WBC 5.4 04/24/2021   HGB 9.5 (L) 04/24/2021   HCT 29.5 (L) 04/24/2021   MCV 96.7 04/24/2021   PLT 193 04/24/2021   Lab Results  Component Value Date   FERRITIN 233 01/22/2021   IRON 99 01/22/2021   TIBC 276 01/22/2021   UIBC 177 01/22/2021   IRONPCTSAT 36 01/22/2021   Lab Results  Component Value Date   RETICCTPCT 1.4 01/22/2021   RBC 3.05 (L) 04/24/2021   RETICCTABS 51.6 01/03/2013   Lab Results  Component Value Date   KPAFRELGTCHN 25.6 (H) 07/17/2020   LAMBDASER 40.5 (H) 07/17/2020   KAPLAMBRATIO 0.63 07/17/2020   Lab Results  Component Value Date   IGGSERUM 957 07/17/2020   IGA 569 (H) 07/17/2020   IGMSERUM 21 (L) 07/17/2020   Lab Results  Component Value Date   TOTALPROTELP 6.9 07/17/2020   ALBUMINELP 4.0 07/17/2020   A1GS 0.2 07/17/2020   A2GS 0.6 07/17/2020   BETS 1.3 07/17/2020   BETA2SER 10.3 (H) 11/04/2011    GAMS 0.8 07/17/2020   MSPIKE 0.2 (H) 07/17/2020   SPEI Comment 07/17/2020     Chemistry      Component Value Date/Time   NA 146 (H) 01/22/2021 1308   NA 150 (H) 03/01/2017 1308   NA 143 04/05/2016 1156   K 4.4 01/22/2021 1308   K 4.0 03/01/2017 1308   K 3.8 04/05/2016 1156   CL 110 01/22/2021 1308   CL 109 (H) 03/01/2017 1308   CO2 27 01/22/2021 1308   CO2 28 03/01/2017 1308   CO2 21 (L) 04/05/2016 1156   BUN 26 (H) 01/22/2021 1308   BUN 19 03/01/2017 1308   BUN 18.5 04/05/2016 1156  CREATININE 1.72 (H) 01/22/2021 1308   CREATININE 1.8 (H) 03/01/2017 1308   CREATININE 1.5 (H) 04/05/2016 1156      Component Value Date/Time   CALCIUM 9.6 01/22/2021 1308   CALCIUM 9.7 03/01/2017 1308   CALCIUM 9.3 04/05/2016 1156   ALKPHOS 72 01/22/2021 1308   ALKPHOS 55 03/01/2017 1308   ALKPHOS 77 04/05/2016 1156   AST 17 01/22/2021 1308   AST 21 04/05/2016 1156   ALT 15 01/22/2021 1308   ALT 18 03/01/2017 1308   ALT 22 04/05/2016 1156   BILITOT 0.5 01/22/2021 1308   BILITOT 0.36 04/05/2016 1156       Impression and Plan: Ms. Agent is a very pleasant 75 yo African American female with multifactorial anemia as well as an IgA lambda MGUS.  ESA given, Hgb 9.5.  Iron and protein studies are pending.  Lab and injection in 6 weeks, follow-up in 3 months.   [name], NP 12/9/20222:22 PM

## 2021-04-24 NOTE — Patient Instructions (Signed)
Darbepoetin Alfa injection ?What is this medication? ?DARBEPOETIN ALFA (dar be POE e tin  AL fa) helps your body make more red blood cells. It is used to treat anemia caused by chronic kidney failure and chemotherapy. ?This medicine may be used for other purposes; ask your health care provider or pharmacist if you have questions. ?COMMON BRAND NAME(S): Aranesp ?What should I tell my care team before I take this medication? ?They need to know if you have any of these conditions: ?blood clotting disorders or history of blood clots ?cancer patient not on chemotherapy ?cystic fibrosis ?heart disease, such as angina, heart failure, or a history of a heart attack ?hemoglobin level of 12 g/dL or greater ?high blood pressure ?low levels of folate, iron, or vitamin B12 ?seizures ?an unusual or allergic reaction to darbepoetin, erythropoietin, albumin, hamster proteins, latex, other medicines, foods, dyes, or preservatives ?pregnant or trying to get pregnant ?breast-feeding ?How should I use this medication? ?This medicine is for injection into a vein or under the skin. It is usually given by a health care professional in a hospital or clinic setting. ?If you get this medicine at home, you will be taught how to prepare and give this medicine. Use exactly as directed. Take your medicine at regular intervals. Do not take your medicine more often than directed. ?It is important that you put your used needles and syringes in a special sharps container. Do not put them in a trash can. If you do not have a sharps container, call your pharmacist or healthcare provider to get one. ?A special MedGuide will be given to you by the pharmacist with each prescription and refill. Be sure to read this information carefully each time. ?Talk to your pediatrician regarding the use of this medicine in children. While this medicine may be used in children as young as 1 month of age for selected conditions, precautions do apply. ?Overdosage: If  you think you have taken too much of this medicine contact a poison control center or emergency room at once. ?NOTE: This medicine is only for you. Do not share this medicine with others. ?What if I miss a dose? ?If you miss a dose, take it as soon as you can. If it is almost time for your next dose, take only that dose. Do not take double or extra doses. ?What may interact with this medication? ?Do not take this medicine with any of the following medications: ?epoetin alfa ?This list may not describe all possible interactions. Give your health care provider a list of all the medicines, herbs, non-prescription drugs, or dietary supplements you use. Also tell them if you smoke, drink alcohol, or use illegal drugs. Some items may interact with your medicine. ?What should I watch for while using this medication? ?Your condition will be monitored carefully while you are receiving this medicine. ?You may need blood work done while you are taking this medicine. ?This medicine may cause a decrease in vitamin B6. You should make sure that you get enough vitamin B6 while you are taking this medicine. Discuss the foods you eat and the vitamins you take with your health care professional. ?What side effects may I notice from receiving this medication? ?Side effects that you should report to your doctor or health care professional as soon as possible: ?allergic reactions like skin rash, itching or hives, swelling of the face, lips, or tongue ?breathing problems ?changes in vision ?chest pain ?confusion, trouble speaking or understanding ?feeling faint or lightheaded, falls ?high blood   pressure ?muscle aches or pains ?pain, swelling, warmth in the leg ?rapid weight gain ?severe headaches ?sudden numbness or weakness of the face, arm or leg ?trouble walking, dizziness, loss of balance or coordination ?seizures (convulsions) ?swelling of the ankles, feet, hands ?unusually weak or tired ?Side effects that usually do not require  medical attention (report to your doctor or health care professional if they continue or are bothersome): ?diarrhea ?fever, chills (flu-like symptoms) ?headaches ?nausea, vomiting ?redness, stinging, or swelling at site where injected ?This list may not describe all possible side effects. Call your doctor for medical advice about side effects. You may report side effects to FDA at 1-800-FDA-1088. ?Where should I keep my medication? ?Keep out of the reach of children. ?Store in a refrigerator between 2 and 8 degrees C (36 and 46 degrees F). Do not freeze. Do not shake. Throw away any unused portion if using a single-dose vial. Throw away any unused medicine after the expiration date. ?NOTE: This sheet is a summary. It may not cover all possible information. If you have questions about this medicine, talk to your doctor, pharmacist, or health care provider. ?? 2022 Elsevier/Gold Standard (2017-05-23 00:00:00) ? ?

## 2021-04-25 LAB — IGG, IGA, IGM
IgA: 553 mg/dL — ABNORMAL HIGH (ref 64–422)
IgG (Immunoglobin G), Serum: 989 mg/dL (ref 586–1602)
IgM (Immunoglobulin M), Srm: 24 mg/dL — ABNORMAL LOW (ref 26–217)

## 2021-04-27 LAB — KAPPA/LAMBDA LIGHT CHAINS
Kappa free light chain: 26.5 mg/L — ABNORMAL HIGH (ref 3.3–19.4)
Kappa, lambda light chain ratio: 0.73 (ref 0.26–1.65)
Lambda free light chains: 36.5 mg/L — ABNORMAL HIGH (ref 5.7–26.3)

## 2021-04-27 LAB — IRON AND TIBC
Iron: 93 ug/dL (ref 41–142)
Saturation Ratios: 34 % (ref 21–57)
TIBC: 274 ug/dL (ref 236–444)
UIBC: 181 ug/dL (ref 120–384)

## 2021-04-27 LAB — FERRITIN: Ferritin: 226 ng/mL (ref 11–307)

## 2021-04-28 LAB — PROTEIN ELECTROPHORESIS, SERUM
A/G Ratio: 1.3 (ref 0.7–1.7)
Albumin ELP: 3.9 g/dL (ref 2.9–4.4)
Alpha-1-Globulin: 0.2 g/dL (ref 0.0–0.4)
Alpha-2-Globulin: 0.7 g/dL (ref 0.4–1.0)
Beta Globulin: 1.2 g/dL (ref 0.7–1.3)
Gamma Globulin: 0.9 g/dL (ref 0.4–1.8)
Globulin, Total: 3 g/dL (ref 2.2–3.9)
M-Spike, %: 0.3 g/dL — ABNORMAL HIGH
Total Protein ELP: 6.9 g/dL (ref 6.0–8.5)

## 2021-04-30 DIAGNOSIS — Z981 Arthrodesis status: Secondary | ICD-10-CM | POA: Diagnosis not present

## 2021-05-25 DIAGNOSIS — H40053 Ocular hypertension, bilateral: Secondary | ICD-10-CM | POA: Diagnosis not present

## 2021-05-25 DIAGNOSIS — H26493 Other secondary cataract, bilateral: Secondary | ICD-10-CM | POA: Diagnosis not present

## 2021-05-25 DIAGNOSIS — Z961 Presence of intraocular lens: Secondary | ICD-10-CM | POA: Diagnosis not present

## 2021-06-03 ENCOUNTER — Other Ambulatory Visit: Payer: Self-pay | Admitting: Internal Medicine

## 2021-06-05 ENCOUNTER — Other Ambulatory Visit: Payer: Self-pay

## 2021-06-05 ENCOUNTER — Inpatient Hospital Stay: Payer: Medicare Other | Attending: Hematology & Oncology

## 2021-06-05 ENCOUNTER — Inpatient Hospital Stay: Payer: Medicare Other

## 2021-06-05 VITALS — BP 139/66 | HR 92 | Temp 99.0°F | Resp 18

## 2021-06-05 DIAGNOSIS — E611 Iron deficiency: Secondary | ICD-10-CM | POA: Insufficient documentation

## 2021-06-05 DIAGNOSIS — N182 Chronic kidney disease, stage 2 (mild): Secondary | ICD-10-CM | POA: Diagnosis not present

## 2021-06-05 DIAGNOSIS — D509 Iron deficiency anemia, unspecified: Secondary | ICD-10-CM | POA: Insufficient documentation

## 2021-06-05 DIAGNOSIS — D472 Monoclonal gammopathy: Secondary | ICD-10-CM

## 2021-06-05 DIAGNOSIS — N184 Chronic kidney disease, stage 4 (severe): Secondary | ICD-10-CM | POA: Insufficient documentation

## 2021-06-05 DIAGNOSIS — D631 Anemia in chronic kidney disease: Secondary | ICD-10-CM

## 2021-06-05 DIAGNOSIS — D508 Other iron deficiency anemias: Secondary | ICD-10-CM

## 2021-06-05 LAB — CBC WITH DIFFERENTIAL (CANCER CENTER ONLY)
Abs Immature Granulocytes: 0.01 10*3/uL (ref 0.00–0.07)
Basophils Absolute: 0 10*3/uL (ref 0.0–0.1)
Basophils Relative: 1 %
Eosinophils Absolute: 0.1 10*3/uL (ref 0.0–0.5)
Eosinophils Relative: 3 %
HCT: 31.7 % — ABNORMAL LOW (ref 36.0–46.0)
Hemoglobin: 10.2 g/dL — ABNORMAL LOW (ref 12.0–15.0)
Immature Granulocytes: 0 %
Lymphocytes Relative: 58 %
Lymphs Abs: 2.7 10*3/uL (ref 0.7–4.0)
MCH: 31.8 pg (ref 26.0–34.0)
MCHC: 32.2 g/dL (ref 30.0–36.0)
MCV: 98.8 fL (ref 80.0–100.0)
Monocytes Absolute: 0.6 10*3/uL (ref 0.1–1.0)
Monocytes Relative: 13 %
Neutro Abs: 1.2 10*3/uL — ABNORMAL LOW (ref 1.7–7.7)
Neutrophils Relative %: 25 %
Platelet Count: 167 10*3/uL (ref 150–400)
RBC: 3.21 MIL/uL — ABNORMAL LOW (ref 3.87–5.11)
RDW: 13.2 % (ref 11.5–15.5)
WBC Count: 4.6 10*3/uL (ref 4.0–10.5)
nRBC: 0 % (ref 0.0–0.2)

## 2021-06-05 LAB — CMP (CANCER CENTER ONLY)
ALT: 22 U/L (ref 0–44)
AST: 24 U/L (ref 15–41)
Albumin: 4.2 g/dL (ref 3.5–5.0)
Alkaline Phosphatase: 79 U/L (ref 38–126)
Anion gap: 8 (ref 5–15)
BUN: 27 mg/dL — ABNORMAL HIGH (ref 8–23)
CO2: 28 mmol/L (ref 22–32)
Calcium: 9.9 mg/dL (ref 8.9–10.3)
Chloride: 108 mmol/L (ref 98–111)
Creatinine: 1.94 mg/dL — ABNORMAL HIGH (ref 0.44–1.00)
GFR, Estimated: 27 mL/min — ABNORMAL LOW (ref >60)
Glucose, Bld: 105 mg/dL — ABNORMAL HIGH (ref 70–99)
Potassium: 4.1 mmol/L (ref 3.5–5.1)
Sodium: 144 mmol/L (ref 135–145)
Total Bilirubin: 0.3 mg/dL (ref 0.3–1.2)
Total Protein: 7.1 g/dL (ref 6.5–8.1)

## 2021-06-05 LAB — RETICULOCYTES
Immature Retic Fract: 9.8 % (ref 2.3–15.9)
RBC.: 3.21 MIL/uL — ABNORMAL LOW (ref 3.87–5.11)
Retic Count, Absolute: 44.6 10*3/uL (ref 19.0–186.0)
Retic Ct Pct: 1.4 % (ref 0.4–3.1)

## 2021-06-05 MED ORDER — DARBEPOETIN ALFA 300 MCG/0.6ML IJ SOSY
300.0000 ug | PREFILLED_SYRINGE | Freq: Once | INTRAMUSCULAR | Status: AC
  Administered 2021-06-05: 300 ug via SUBCUTANEOUS
  Filled 2021-06-05: qty 0.6

## 2021-06-05 NOTE — Patient Instructions (Signed)
Darbepoetin Alfa injection ?What is this medication? ?DARBEPOETIN ALFA (dar be POE e tin  AL fa) helps your body make more red blood cells. It is used to treat anemia caused by chronic kidney failure and chemotherapy. ?This medicine may be used for other purposes; ask your health care provider or pharmacist if you have questions. ?COMMON BRAND NAME(S): Aranesp ?What should I tell my care team before I take this medication? ?They need to know if you have any of these conditions: ?blood clotting disorders or history of blood clots ?cancer patient not on chemotherapy ?cystic fibrosis ?heart disease, such as angina, heart failure, or a history of a heart attack ?hemoglobin level of 12 g/dL or greater ?high blood pressure ?low levels of folate, iron, or vitamin B12 ?seizures ?an unusual or allergic reaction to darbepoetin, erythropoietin, albumin, hamster proteins, latex, other medicines, foods, dyes, or preservatives ?pregnant or trying to get pregnant ?breast-feeding ?How should I use this medication? ?This medicine is for injection into a vein or under the skin. It is usually given by a health care professional in a hospital or clinic setting. ?If you get this medicine at home, you will be taught how to prepare and give this medicine. Use exactly as directed. Take your medicine at regular intervals. Do not take your medicine more often than directed. ?It is important that you put your used needles and syringes in a special sharps container. Do not put them in a trash can. If you do not have a sharps container, call your pharmacist or healthcare provider to get one. ?A special MedGuide will be given to you by the pharmacist with each prescription and refill. Be sure to read this information carefully each time. ?Talk to your pediatrician regarding the use of this medicine in children. While this medicine may be used in children as young as 1 month of age for selected conditions, precautions do apply. ?Overdosage: If  you think you have taken too much of this medicine contact a poison control center or emergency room at once. ?NOTE: This medicine is only for you. Do not share this medicine with others. ?What if I miss a dose? ?If you miss a dose, take it as soon as you can. If it is almost time for your next dose, take only that dose. Do not take double or extra doses. ?What may interact with this medication? ?Do not take this medicine with any of the following medications: ?epoetin alfa ?This list may not describe all possible interactions. Give your health care provider a list of all the medicines, herbs, non-prescription drugs, or dietary supplements you use. Also tell them if you smoke, drink alcohol, or use illegal drugs. Some items may interact with your medicine. ?What should I watch for while using this medication? ?Your condition will be monitored carefully while you are receiving this medicine. ?You may need blood work done while you are taking this medicine. ?This medicine may cause a decrease in vitamin B6. You should make sure that you get enough vitamin B6 while you are taking this medicine. Discuss the foods you eat and the vitamins you take with your health care professional. ?What side effects may I notice from receiving this medication? ?Side effects that you should report to your doctor or health care professional as soon as possible: ?allergic reactions like skin rash, itching or hives, swelling of the face, lips, or tongue ?breathing problems ?changes in vision ?chest pain ?confusion, trouble speaking or understanding ?feeling faint or lightheaded, falls ?high blood   pressure ?muscle aches or pains ?pain, swelling, warmth in the leg ?rapid weight gain ?severe headaches ?sudden numbness or weakness of the face, arm or leg ?trouble walking, dizziness, loss of balance or coordination ?seizures (convulsions) ?swelling of the ankles, feet, hands ?unusually weak or tired ?Side effects that usually do not require  medical attention (report to your doctor or health care professional if they continue or are bothersome): ?diarrhea ?fever, chills (flu-like symptoms) ?headaches ?nausea, vomiting ?redness, stinging, or swelling at site where injected ?This list may not describe all possible side effects. Call your doctor for medical advice about side effects. You may report side effects to FDA at 1-800-FDA-1088. ?Where should I keep my medication? ?Keep out of the reach of children. ?Store in a refrigerator between 2 and 8 degrees C (36 and 46 degrees F). Do not freeze. Do not shake. Throw away any unused portion if using a single-dose vial. Throw away any unused medicine after the expiration date. ?NOTE: This sheet is a summary. It may not cover all possible information. If you have questions about this medicine, talk to your doctor, pharmacist, or health care provider. ?? 2022 Elsevier/Gold Standard (2017-05-23 00:00:00) ? ?

## 2021-06-08 LAB — IRON AND IRON BINDING CAPACITY (CC-WL,HP ONLY)
Iron: 105 ug/dL (ref 28–170)
Saturation Ratios: 33 % — ABNORMAL HIGH (ref 10.4–31.8)
TIBC: 316 ug/dL (ref 250–450)
UIBC: 211 ug/dL (ref 148–442)

## 2021-06-08 LAB — FERRITIN: Ferritin: 223 ng/mL (ref 11–307)

## 2021-06-10 ENCOUNTER — Telehealth: Payer: Self-pay | Admitting: Internal Medicine

## 2021-06-10 NOTE — Telephone Encounter (Signed)
Left message for patient to call back to schedule Medicare Annual Wellness Visit   Last AWV  05/03/20  Please schedule at anytime with [facility] if patient calls the office back.    40 Minutes appointment   Any questions, please call me at [phone]

## 2021-06-25 DIAGNOSIS — Z961 Presence of intraocular lens: Secondary | ICD-10-CM | POA: Diagnosis not present

## 2021-06-25 DIAGNOSIS — H26492 Other secondary cataract, left eye: Secondary | ICD-10-CM | POA: Diagnosis not present

## 2021-06-25 DIAGNOSIS — H40053 Ocular hypertension, bilateral: Secondary | ICD-10-CM | POA: Diagnosis not present

## 2021-06-25 DIAGNOSIS — H18413 Arcus senilis, bilateral: Secondary | ICD-10-CM | POA: Diagnosis not present

## 2021-06-25 DIAGNOSIS — H26493 Other secondary cataract, bilateral: Secondary | ICD-10-CM | POA: Diagnosis not present

## 2021-07-14 ENCOUNTER — Encounter: Payer: Self-pay | Admitting: Family

## 2021-07-14 ENCOUNTER — Encounter: Payer: Self-pay | Admitting: Hematology & Oncology

## 2021-07-14 DIAGNOSIS — H26491 Other secondary cataract, right eye: Secondary | ICD-10-CM | POA: Diagnosis not present

## 2021-07-22 DIAGNOSIS — M0589 Other rheumatoid arthritis with rheumatoid factor of multiple sites: Secondary | ICD-10-CM | POA: Diagnosis not present

## 2021-07-22 DIAGNOSIS — Z79899 Other long term (current) drug therapy: Secondary | ICD-10-CM | POA: Diagnosis not present

## 2021-07-24 ENCOUNTER — Inpatient Hospital Stay: Payer: Medicare Other

## 2021-07-24 ENCOUNTER — Inpatient Hospital Stay (HOSPITAL_BASED_OUTPATIENT_CLINIC_OR_DEPARTMENT_OTHER): Payer: Medicare Other | Admitting: Family

## 2021-07-24 ENCOUNTER — Encounter: Payer: Self-pay | Admitting: Family

## 2021-07-24 ENCOUNTER — Other Ambulatory Visit: Payer: Self-pay

## 2021-07-24 ENCOUNTER — Telehealth: Payer: Self-pay | Admitting: *Deleted

## 2021-07-24 ENCOUNTER — Inpatient Hospital Stay: Payer: Medicare Other | Attending: Hematology & Oncology

## 2021-07-24 VITALS — BP 108/40 | HR 88 | Temp 98.2°F | Resp 17 | Wt 202.0 lb

## 2021-07-24 DIAGNOSIS — D508 Other iron deficiency anemias: Secondary | ICD-10-CM | POA: Diagnosis not present

## 2021-07-24 DIAGNOSIS — D631 Anemia in chronic kidney disease: Secondary | ICD-10-CM

## 2021-07-24 DIAGNOSIS — E611 Iron deficiency: Secondary | ICD-10-CM | POA: Insufficient documentation

## 2021-07-24 DIAGNOSIS — D509 Iron deficiency anemia, unspecified: Secondary | ICD-10-CM | POA: Insufficient documentation

## 2021-07-24 DIAGNOSIS — D472 Monoclonal gammopathy: Secondary | ICD-10-CM | POA: Insufficient documentation

## 2021-07-24 DIAGNOSIS — N181 Chronic kidney disease, stage 1: Secondary | ICD-10-CM

## 2021-07-24 DIAGNOSIS — N182 Chronic kidney disease, stage 2 (mild): Secondary | ICD-10-CM | POA: Insufficient documentation

## 2021-07-24 LAB — CBC WITH DIFFERENTIAL (CANCER CENTER ONLY)
Abs Immature Granulocytes: 0.01 10*3/uL (ref 0.00–0.07)
Basophils Absolute: 0 10*3/uL (ref 0.0–0.1)
Basophils Relative: 1 %
Eosinophils Absolute: 0.2 10*3/uL (ref 0.0–0.5)
Eosinophils Relative: 4 %
HCT: 32 % — ABNORMAL LOW (ref 36.0–46.0)
Hemoglobin: 10.2 g/dL — ABNORMAL LOW (ref 12.0–15.0)
Immature Granulocytes: 0 %
Lymphocytes Relative: 58 %
Lymphs Abs: 2.7 10*3/uL (ref 0.7–4.0)
MCH: 31 pg (ref 26.0–34.0)
MCHC: 31.9 g/dL (ref 30.0–36.0)
MCV: 97.3 fL (ref 80.0–100.0)
Monocytes Absolute: 0.6 10*3/uL (ref 0.1–1.0)
Monocytes Relative: 14 %
Neutro Abs: 1 10*3/uL — ABNORMAL LOW (ref 1.7–7.7)
Neutrophils Relative %: 23 %
Platelet Count: 173 10*3/uL (ref 150–400)
RBC: 3.29 MIL/uL — ABNORMAL LOW (ref 3.87–5.11)
RDW: 13.8 % (ref 11.5–15.5)
WBC Count: 4.6 10*3/uL (ref 4.0–10.5)
nRBC: 0 % (ref 0.0–0.2)

## 2021-07-24 LAB — RETICULOCYTES
Immature Retic Fract: 16.8 % — ABNORMAL HIGH (ref 2.3–15.9)
RBC.: 3.28 MIL/uL — ABNORMAL LOW (ref 3.87–5.11)
Retic Count, Absolute: 38 10*3/uL (ref 19.0–186.0)
Retic Ct Pct: 1.2 % (ref 0.4–3.1)

## 2021-07-24 LAB — CMP (CANCER CENTER ONLY)
ALT: 16 U/L (ref 0–44)
AST: 18 U/L (ref 15–41)
Albumin: 4 g/dL (ref 3.5–5.0)
Alkaline Phosphatase: 78 U/L (ref 38–126)
Anion gap: 9 (ref 5–15)
BUN: 35 mg/dL — ABNORMAL HIGH (ref 8–23)
CO2: 30 mmol/L (ref 22–32)
Calcium: 8.9 mg/dL (ref 8.9–10.3)
Chloride: 106 mmol/L (ref 98–111)
Creatinine: 1.97 mg/dL — ABNORMAL HIGH (ref 0.44–1.00)
GFR, Estimated: 26 mL/min — ABNORMAL LOW (ref >60)
Glucose, Bld: 101 mg/dL — ABNORMAL HIGH (ref 70–99)
Potassium: 4 mmol/L (ref 3.5–5.1)
Sodium: 145 mmol/L (ref 135–145)
Total Bilirubin: 0.4 mg/dL (ref 0.3–1.2)
Total Protein: 7 g/dL (ref 6.5–8.1)

## 2021-07-24 MED ORDER — DARBEPOETIN ALFA 300 MCG/0.6ML IJ SOSY
300.0000 ug | PREFILLED_SYRINGE | Freq: Once | INTRAMUSCULAR | Status: AC
  Administered 2021-07-24: 300 ug via SUBCUTANEOUS
  Filled 2021-07-24: qty 0.6

## 2021-07-24 NOTE — Progress Notes (Signed)
?Hematology and Oncology Follow Up Visit ? ?Tina Molina ?[id number] ?1946/04/20 76 y.o. ?07/24/2021 ? ? ?Principle Diagnosis:  ?Anemia of chronic renal failure - stage II ?MGUS - IgA lambda ?Iron deficiency anemia ?  ?Current Therapy:        ?Aranesp 300 mcg SQ to keep Hgb > 11 ?IV iron as indicated ?  ?Interim History:  Tina Molina is here today for follow-up and injection. She is symptomatic with fatigue.  ?She notes mild SOB with over exertion (stairs) and takes a break to rest as needed.  ?No fever, chills, n/v, cough, dizziness, chest pain, palpitations, abdominal pain or changes in bowel or bladder habits.  ?No swelling in her extremities at this time.  ?She has numbness and tingling in her fingertips and feet that comes and goes.  ?No falls or syncope to report.  ?She has maintained a good appetite but admits that she needs to better hydrate throughout the day. Her weight is stable at 202 lbs.  ? ?ECOG Performance Status: 1 - Symptomatic but completely ambulatory ? ?Medications:  ?Allergies as of 07/24/2021   ? ?   Reactions  ? Penicillins Other (See Comments)  ? Intolerance or allergy not remembered; her sister stated penicillin caused "boils under the skin"  ? Shellfish Allergy Rash  ? Shellfish-derived Products Anaphylaxis  ? Codeine Nausea Only  ? Nausea only with liquid codeine  ? Infliximab Other (See Comments)  ?  Confusion (intolerance) ?Knots under the skin  ? Sulfonamide Derivatives Rash  ? Joint pain  ? Iodine   ? Azithromycin Itching  ? ?  ? ?  ?Medication List  ?  ? ?  ? Accurate as of July 24, 2021  2:12 PM. If you have any questions, ask your nurse or doctor.  ?  ?  ? ?  ? ?aspirin 81 MG tablet ?Take 81 mg by mouth daily. ?  ?atorvastatin 20 MG tablet ?Commonly known as: LIPITOR ?TAKE ONE TABLET BY MOUTH ONCE DAILY ?  ?budesonide-formoterol 160-4.5 MCG/ACT inhaler ?Commonly known as: Symbicort ?inhale 2 puffs if needed for wheezing ?  ?carvedilol 25 MG tablet ?Commonly known as: COREG ?TAKE ONE  TABLET BY MOUTH TWICE DAILY ?  ?cyclobenzaprine 10 MG tablet ?Commonly known as: FLEXERIL ?TAKE ONE TABLET BY MOUTH THREE TIMES DAILY ?  ?famotidine 40 MG tablet ?Commonly known as: PEPCID ?TAKE ONE TABLET BY MOUTH TWICE DAILY ?  ?furosemide 20 MG tablet ?Commonly known as: LASIX ?TAKE ONE TABLET BY MOUTH ONCE DAILY ?  ?gabapentin 300 MG capsule ?Commonly known as: NEURONTIN ?TAKE ONE CAPSULE BY MOUTH THREE TIMES DAILY ?  ?hydrALAZINE 50 MG tablet ?Commonly known as: APRESOLINE ?TAKE ONE TABLET BY MOUTH THREE TIMES DAILY ?  ?latanoprost 0.005 % ophthalmic solution ?Commonly known as: XALATAN ?1 drop at bedtime. ?  ?leflunomide 20 MG tablet ?Commonly known as: ARAVA ?Take 20 mg by mouth daily. ?  ?levothyroxine 200 MCG tablet ?Commonly known as: SYNTHROID ?TAKE ONE TABLET BY MOUTH BEFORE BREAKFAST ?  ?MELATONIN GUMMIES PO ?Take by mouth at bedtime as needed. ?  ?multivitamin with minerals Tabs tablet ?Take 1 tablet by mouth daily. ?  ?spironolactone 25 MG tablet ?Commonly known as: ALDACTONE ?TAKE ONE TABLET BY MOUTH ONCE DAILY ?  ?telmisartan 40 MG tablet ?Commonly known as: MICARDIS ?TAKE ONE TABLET BY MOUTH DAILY ?  ?vitamin E 180 MG (400 UNITS) capsule ?Take 400 Units by mouth daily. ?  ?vitamin E 180 MG (400 UNITS) capsule ?1 capsule ?  ? ?  ? ? ?  Allergies:  ?Allergies  ?Allergen Reactions  ? Penicillins Other (See Comments)  ?  Intolerance or allergy not remembered; her sister stated penicillin caused "boils under the skin"  ? Shellfish Allergy Rash  ? Shellfish-Derived Products Anaphylaxis  ? Codeine Nausea Only  ?  Nausea only with liquid codeine  ? Infliximab Other (See Comments)  ?   Confusion (intolerance) ?Knots under the skin  ? Sulfonamide Derivatives Rash  ?  Joint pain  ? Iodine   ? Azithromycin Itching  ? ? ?Past Medical History, Surgical history, Social history, and Family History were reviewed and updated. ? ?Review of Systems: ?All other 10 point review of systems is negative.  ? ?Physical Exam: ?  weight is 202 lb (91.6 kg). Her oral temperature is 98.2 ?F (36.8 ?C). Her blood pressure is 108/40 (abnormal) and her pulse is 88. Her respiration is 17 and oxygen saturation is 96%.  ? ?Wt Readings from Last 3 Encounters:  ?07/24/21 202 lb (91.6 kg)  ?04/24/21 200 lb (90.7 kg)  ?02/16/21 199 lb (90.3 kg)  ? ? ?Ocular: Sclerae unicteric, pupils equal, round and reactive to light ?Ear-nose-throat: Oropharynx clear, dentition fair ?Lymphatic: No cervical or supraclavicular adenopathy ?Lungs no rales or rhonchi, good excursion bilaterally ?Heart regular rate and rhythm, no murmur appreciated ?Abd soft, nontender, positive bowel sounds ?MSK no focal spinal tenderness, no joint edema ?Neuro: non-focal, well-oriented, appropriate affect ?Breasts: Deferred  ? ?Lab Results  ?Component Value Date  ? WBC 4.6 07/24/2021  ? HGB 10.2 (L) 07/24/2021  ? HCT 32.0 (L) 07/24/2021  ? MCV 97.3 07/24/2021  ? PLT 173 07/24/2021  ? ?Lab Results  ?Component Value Date  ? FERRITIN 223 06/05/2021  ? IRON 105 06/05/2021  ? TIBC 316 06/05/2021  ? UIBC 211 06/05/2021  ? IRONPCTSAT 33 (H) 06/05/2021  ? ?Lab Results  ?Component Value Date  ? RETICCTPCT 1.2 07/24/2021  ? RBC 3.28 (L) 07/24/2021  ? RETICCTABS 51.6 01/03/2013  ? ?Lab Results  ?Component Value Date  ? KPAFRELGTCHN 26.5 (H) 04/24/2021  ? LAMBDASER 36.5 (H) 04/24/2021  ? KAPLAMBRATIO 0.73 04/24/2021  ? ?Lab Results  ?Component Value Date  ? IGGSERUM 989 04/24/2021  ? IGA 553 (H) 04/24/2021  ? IGMSERUM 24 (L) 04/24/2021  ? ?Lab Results  ?Component Value Date  ? TOTALPROTELP 6.9 04/24/2021  ? ALBUMINELP 3.9 04/24/2021  ? A1GS 0.2 04/24/2021  ? A2GS 0.7 04/24/2021  ? BETS 1.2 04/24/2021  ? BETA2SER 10.3 (H) 11/04/2011  ? GAMS 0.9 04/24/2021  ? MSPIKE 0.3 (H) 04/24/2021  ? SPEI Comment 04/24/2021  ? ?  Chemistry   ?   ?Component Value Date/Time  ? NA 144 06/05/2021 1342  ? NA 150 (H) 03/01/2017 1308  ? NA 143 04/05/2016 1156  ? K 4.1 06/05/2021 1342  ? K 4.0 03/01/2017 1308  ? K 3.8  04/05/2016 1156  ? CL 108 06/05/2021 1342  ? CL 109 (H) 03/01/2017 1308  ? CO2 28 06/05/2021 1342  ? CO2 28 03/01/2017 1308  ? CO2 21 (L) 04/05/2016 1156  ? BUN 27 (H) 06/05/2021 1342  ? BUN 19 03/01/2017 1308  ? BUN 18.5 04/05/2016 1156  ? CREATININE 1.94 (H) 06/05/2021 1342  ? CREATININE 1.8 (H) 03/01/2017 1308  ? CREATININE 1.5 (H) 04/05/2016 1156  ?    ?Component Value Date/Time  ? CALCIUM 9.9 06/05/2021 1342  ? CALCIUM 9.7 03/01/2017 1308  ? CALCIUM 9.3 04/05/2016 1156  ? ALKPHOS 79 06/05/2021 1342  ? ALKPHOS 55  03/01/2017 1308  ? ALKPHOS 77 04/05/2016 1156  ? AST 24 06/05/2021 1342  ? AST 21 04/05/2016 1156  ? ALT 22 06/05/2021 1342  ? ALT 18 03/01/2017 1308  ? ALT 22 04/05/2016 1156  ? BILITOT 0.3 06/05/2021 1342  ? BILITOT 0.36 04/05/2016 1156  ?  ? ? ? ?Impression and Plan: Ms. Dina is a very pleasant 76 yo African American female with multifactorial anemia as well as an IgA lambda MGUS.  ?ESA given, given 10.2.  ?Iron studies are pending.  ?Lab and injection in 6 weeks, follow-up in 3 months.  ? ?[name], NP ?3/10/20232:12 PM ? ?

## 2021-07-24 NOTE — Telephone Encounter (Signed)
Per 07/24/21 los - gave upcoming appointments - confirmed ?

## 2021-07-24 NOTE — Patient Instructions (Signed)
Darbepoetin Alfa injection ?What is this medication? ?DARBEPOETIN ALFA (dar be POE e tin  AL fa) helps your body make more red blood cells. It is used to treat anemia caused by chronic kidney failure and chemotherapy. ?This medicine may be used for other purposes; ask your health care provider or pharmacist if you have questions. ?COMMON BRAND NAME(S): Aranesp ?What should I tell my care team before I take this medication? ?They need to know if you have any of these conditions: ?blood clotting disorders or history of blood clots ?cancer patient not on chemotherapy ?cystic fibrosis ?heart disease, such as angina, heart failure, or a history of a heart attack ?hemoglobin level of 12 g/dL or greater ?high blood pressure ?low levels of folate, iron, or vitamin B12 ?seizures ?an unusual or allergic reaction to darbepoetin, erythropoietin, albumin, hamster proteins, latex, other medicines, foods, dyes, or preservatives ?pregnant or trying to get pregnant ?breast-feeding ?How should I use this medication? ?This medicine is for injection into a vein or under the skin. It is usually given by a health care professional in a hospital or clinic setting. ?If you get this medicine at home, you will be taught how to prepare and give this medicine. Use exactly as directed. Take your medicine at regular intervals. Do not take your medicine more often than directed. ?It is important that you put your used needles and syringes in a special sharps container. Do not put them in a trash can. If you do not have a sharps container, call your pharmacist or healthcare provider to get one. ?A special MedGuide will be given to you by the pharmacist with each prescription and refill. Be sure to read this information carefully each time. ?Talk to your pediatrician regarding the use of this medicine in children. While this medicine may be used in children as young as 1 month of age for selected conditions, precautions do apply. ?Overdosage: If  you think you have taken too much of this medicine contact a poison control center or emergency room at once. ?NOTE: This medicine is only for you. Do not share this medicine with others. ?What if I miss a dose? ?If you miss a dose, take it as soon as you can. If it is almost time for your next dose, take only that dose. Do not take double or extra doses. ?What may interact with this medication? ?Do not take this medicine with any of the following medications: ?epoetin alfa ?This list may not describe all possible interactions. Give your health care provider a list of all the medicines, herbs, non-prescription drugs, or dietary supplements you use. Also tell them if you smoke, drink alcohol, or use illegal drugs. Some items may interact with your medicine. ?What should I watch for while using this medication? ?Your condition will be monitored carefully while you are receiving this medicine. ?You may need blood work done while you are taking this medicine. ?This medicine may cause a decrease in vitamin B6. You should make sure that you get enough vitamin B6 while you are taking this medicine. Discuss the foods you eat and the vitamins you take with your health care professional. ?What side effects may I notice from receiving this medication? ?Side effects that you should report to your doctor or health care professional as soon as possible: ?allergic reactions like skin rash, itching or hives, swelling of the face, lips, or tongue ?breathing problems ?changes in vision ?chest pain ?confusion, trouble speaking or understanding ?feeling faint or lightheaded, falls ?high blood   pressure ?muscle aches or pains ?pain, swelling, warmth in the leg ?rapid weight gain ?severe headaches ?sudden numbness or weakness of the face, arm or leg ?trouble walking, dizziness, loss of balance or coordination ?seizures (convulsions) ?swelling of the ankles, feet, hands ?unusually weak or tired ?Side effects that usually do not require  medical attention (report to your doctor or health care professional if they continue or are bothersome): ?diarrhea ?fever, chills (flu-like symptoms) ?headaches ?nausea, vomiting ?redness, stinging, or swelling at site where injected ?This list may not describe all possible side effects. Call your doctor for medical advice about side effects. You may report side effects to FDA at 1-800-FDA-1088. ?Where should I keep my medication? ?Keep out of the reach of children. ?Store in a refrigerator between 2 and 8 degrees C (36 and 46 degrees F). Do not freeze. Do not shake. Throw away any unused portion if using a single-dose vial. Throw away any unused medicine after the expiration date. ?NOTE: This sheet is a summary. It may not cover all possible information. If you have questions about this medicine, talk to your doctor, pharmacist, or health care provider. ?? 2022 Elsevier/Gold Standard (2017-05-23 00:00:00) ? ?

## 2021-07-27 LAB — IRON AND IRON BINDING CAPACITY (CC-WL,HP ONLY)
Iron: 102 ug/dL (ref 28–170)
Saturation Ratios: 34 % — ABNORMAL HIGH (ref 10.4–31.8)
TIBC: 300 ug/dL (ref 250–450)
UIBC: 198 ug/dL (ref 148–442)

## 2021-07-27 LAB — FERRITIN: Ferritin: 213 ng/mL (ref 11–307)

## 2021-08-05 DIAGNOSIS — D472 Monoclonal gammopathy: Secondary | ICD-10-CM | POA: Diagnosis not present

## 2021-08-05 DIAGNOSIS — N1832 Chronic kidney disease, stage 3b: Secondary | ICD-10-CM | POA: Diagnosis not present

## 2021-08-05 DIAGNOSIS — R21 Rash and other nonspecific skin eruption: Secondary | ICD-10-CM | POA: Diagnosis not present

## 2021-08-05 DIAGNOSIS — N390 Urinary tract infection, site not specified: Secondary | ICD-10-CM | POA: Diagnosis not present

## 2021-08-05 DIAGNOSIS — D860 Sarcoidosis of lung: Secondary | ICD-10-CM | POA: Diagnosis not present

## 2021-08-05 DIAGNOSIS — D631 Anemia in chronic kidney disease: Secondary | ICD-10-CM | POA: Diagnosis not present

## 2021-08-05 DIAGNOSIS — M069 Rheumatoid arthritis, unspecified: Secondary | ICD-10-CM | POA: Diagnosis not present

## 2021-08-05 DIAGNOSIS — I129 Hypertensive chronic kidney disease with stage 1 through stage 4 chronic kidney disease, or unspecified chronic kidney disease: Secondary | ICD-10-CM | POA: Diagnosis not present

## 2021-08-17 NOTE — Progress Notes (Signed)
? ? ? ? ?Subjective:  ? ? Patient ID: Tina Molina, female    DOB: 09-03-1945, 76 y.o.   MRN: [id number] ? ?This visit occurred during the SARS-CoV-2 public health emergency.  Safety protocols were in place, including screening questions prior to the visit, additional usage of staff PPE, and extensive cleaning of exam room while observing appropriate contact time as indicated for disinfecting solutions.   ? ? ?HPI ?Tina Molina is here for follow up of her chronic medical problems, including htn, hypothyroid, hld, hyperglycemia, gerd, ckd ? ?One month of a rash on her right hand - bumps.  No itching.  Hurts only on her right thumb where the skin has come off.  She has been putting on peroxide and alcohol which causes it to burn.  No obvious cause.  She has a couple of bumps on the left hand.  They started as red bumps, then blistered.   ? ?No exercise.  ? ?Medications and allergies reviewed with patient and updated if appropriate. ? ?Current Outpatient Medications on File Prior to Visit  ?Medication Sig Dispense Refill  ? aspirin 81 MG tablet Take 81 mg by mouth daily.    ? atorvastatin (LIPITOR) 20 MG tablet TAKE ONE TABLET BY MOUTH ONCE DAILY 90 tablet 1  ? budesonide-formoterol (SYMBICORT) 160-4.5 MCG/ACT inhaler inhale 2 puffs if needed for wheezing 10.2 g 0  ? carvedilol (COREG) 25 MG tablet TAKE ONE TABLET BY MOUTH TWICE DAILY 180 tablet 1  ? cyclobenzaprine (FLEXERIL) 10 MG tablet TAKE ONE TABLET BY MOUTH THREE TIMES DAILY 270 tablet 1  ? famotidine (PEPCID) 40 MG tablet TAKE ONE TABLET BY MOUTH TWICE DAILY 180 tablet 1  ? furosemide (LASIX) 20 MG tablet TAKE ONE TABLET BY MOUTH ONCE DAILY 90 tablet 1  ? gabapentin (NEURONTIN) 300 MG capsule TAKE ONE CAPSULE BY MOUTH THREE TIMES DAILY 270 capsule 1  ? hydrALAZINE (APRESOLINE) 50 MG tablet TAKE ONE TABLET BY MOUTH THREE TIMES DAILY 270 tablet 1  ? latanoprost (XALATAN) 0.005 % ophthalmic solution 1 drop at bedtime.    ? leflunomide (ARAVA) 20 MG tablet Take 20 mg  by mouth daily.    ? levothyroxine (SYNTHROID) 200 MCG tablet TAKE ONE TABLET BY MOUTH BEFORE BREAKFAST 90 tablet 1  ? MELATONIN GUMMIES PO Take by mouth at bedtime as needed.    ? Multiple Vitamin (MULTIVITAMIN WITH MINERALS) TABS Take 1 tablet by mouth daily.    ? prednisoLONE acetate (PRED FORTE) 1 % ophthalmic suspension 1 drop 4 (four) times daily.    ? spironolactone (ALDACTONE) 25 MG tablet TAKE ONE TABLET BY MOUTH ONCE DAILY 90 tablet 1  ? telmisartan (MICARDIS) 40 MG tablet TAKE ONE TABLET BY MOUTH DAILY 90 tablet 1  ? vitamin E 180 MG (400 UNITS) capsule 1 capsule    ? vitamin E 400 UNIT capsule Take 400 Units by mouth daily.     ? [DISCONTINUED] modafinil (PROVIGIL) 100 MG tablet Take 100 mg by mouth daily.    ? [DISCONTINUED] potassium chloride (MICRO-K) 10 MEQ CR capsule Take 1 capsule (10 mEq total) by mouth 2 (two) times daily. 180 capsule 1  ? ?No current facility-administered medications on file prior to visit.  ? ? ? ?Review of Systems  ?Constitutional:  Negative for chills and fever.  ?Respiratory:  Positive for shortness of breath (when walking, chronic - no change). Negative for cough and wheezing.   ?Cardiovascular:  Positive for palpitations (last night) and leg swelling (mild - legs). Negative  for chest pain.  ?Skin:  Positive for rash.  ?Neurological:  Negative for light-headedness and headaches.  ? ?   ?Objective:  ? ?Vitals:  ? 08/18/21 1354  ?BP: 140/60  ?Pulse: 68  ?Temp: (!) 97.5 ?F (36.4 ?C)  ?SpO2: 96%  ? ?BP Readings from Last 3 Encounters:  ?08/18/21 140/60  ?08/18/21 140/60  ?07/24/21 (!) 108/40  ? ?Wt Readings from Last 3 Encounters:  ?08/18/21 200 lb 6.4 oz (90.9 kg)  ?08/18/21 200 lb 6.4 oz (90.9 kg)  ?07/24/21 202 lb (91.6 kg)  ? ?Body mass index is 34.4 kg/m?. ? ?  ?Physical Exam ?Constitutional:   ?   General: She is not in acute distress. ?   Appearance: Normal appearance.  ?HENT:  ?   Head: Normocephalic and atraumatic.  ?Eyes:  ?   Conjunctiva/sclera: Conjunctivae normal.   ?Cardiovascular:  ?   Rate and Rhythm: Normal rate and regular rhythm.  ?   Heart sounds: Normal heart sounds. No murmur heard. ?Pulmonary:  ?   Effort: Pulmonary effort is normal. No respiratory distress.  ?   Breath sounds: Normal breath sounds. No wheezing.  ?Musculoskeletal:  ?   Cervical back: Neck supple.  ?   Right lower leg: No edema.  ?   Left lower leg: No edema.  ?Lymphadenopathy:  ?   Cervical: No cervical adenopathy.  ?Skin: ?   Findings: Rash (papules and blisters and right palm and a few fingers - ulcerated area on right thumb) present.  ?Neurological:  ?   Mental Status: She is alert. Mental status is at baseline.  ?Psychiatric:     ?   Mood and Affect: Mood normal.     ?   Behavior: Behavior normal.  ? ?   ? ? ? ? ?Lab Results  ?Component Value Date  ? WBC 4.6 07/24/2021  ? HGB 10.2 (L) 07/24/2021  ? HCT 32.0 (L) 07/24/2021  ? PLT 173 07/24/2021  ? GLUCOSE 101 (H) 07/24/2021  ? CHOL 191 08/15/2020  ? TRIG 139.0 08/15/2020  ? HDL 60.80 08/15/2020  ? LDLDIRECT 156.5 05/27/2011  ? LDLCALC 103 (H) 08/15/2020  ? ALT 16 07/24/2021  ? AST 18 07/24/2021  ? NA 145 07/24/2021  ? K 4.0 07/24/2021  ? CL 106 07/24/2021  ? CREATININE 1.97 (H) 07/24/2021  ? BUN 35 (H) 07/24/2021  ? CO2 30 07/24/2021  ? TSH 0.80 08/15/2020  ? HGBA1C 4.5 (L) 08/15/2020  ? MICROALBUR 0.50 12/15/2012  ? ? ? ?Assessment & Plan:  ? ? ?See Problem List for Assessment and Plan of chronic medical problems.  ? ? ?

## 2021-08-17 NOTE — Patient Instructions (Addendum)
? ? ? ?  Blood work was ordered.   ? ? ?Medications changes include :   triamcinolone cream for your hand rash ? ? ?Your prescription(s) have been sent to your pharmacy.  ? ? ? ?Return in about 6 months (around 02/17/2022) for follow up. ? ?

## 2021-08-18 ENCOUNTER — Ambulatory Visit: Payer: Medicare Other | Admitting: Internal Medicine

## 2021-08-18 ENCOUNTER — Encounter: Payer: Self-pay | Admitting: Internal Medicine

## 2021-08-18 ENCOUNTER — Ambulatory Visit (INDEPENDENT_AMBULATORY_CARE_PROVIDER_SITE_OTHER): Payer: Medicare Other

## 2021-08-18 ENCOUNTER — Ambulatory Visit (INDEPENDENT_AMBULATORY_CARE_PROVIDER_SITE_OTHER): Payer: Medicare Other | Admitting: Internal Medicine

## 2021-08-18 VITALS — BP 140/60 | HR 68 | Temp 97.5°F | Ht 64.0 in | Wt 200.4 lb

## 2021-08-18 DIAGNOSIS — E890 Postprocedural hypothyroidism: Secondary | ICD-10-CM

## 2021-08-18 DIAGNOSIS — I10 Essential (primary) hypertension: Secondary | ICD-10-CM | POA: Diagnosis not present

## 2021-08-18 DIAGNOSIS — K219 Gastro-esophageal reflux disease without esophagitis: Secondary | ICD-10-CM

## 2021-08-18 DIAGNOSIS — R21 Rash and other nonspecific skin eruption: Secondary | ICD-10-CM | POA: Diagnosis not present

## 2021-08-18 DIAGNOSIS — N184 Chronic kidney disease, stage 4 (severe): Secondary | ICD-10-CM

## 2021-08-18 DIAGNOSIS — E7849 Other hyperlipidemia: Secondary | ICD-10-CM | POA: Diagnosis not present

## 2021-08-18 DIAGNOSIS — Z0000 Encounter for general adult medical examination without abnormal findings: Secondary | ICD-10-CM

## 2021-08-18 DIAGNOSIS — R739 Hyperglycemia, unspecified: Secondary | ICD-10-CM

## 2021-08-18 DIAGNOSIS — Z0001 Encounter for general adult medical examination with abnormal findings: Secondary | ICD-10-CM | POA: Diagnosis not present

## 2021-08-18 LAB — TSH: TSH: 0.06 u[IU]/mL — ABNORMAL LOW (ref 0.35–5.50)

## 2021-08-18 LAB — HEMOGLOBIN A1C: Hgb A1c MFr Bld: 5.2 % (ref 4.6–6.5)

## 2021-08-18 MED ORDER — TRIAMCINOLONE ACETONIDE 0.5 % EX OINT
1.0000 | TOPICAL_OINTMENT | Freq: Two times a day (BID) | CUTANEOUS | 0 refills | Status: DC
Start: 2021-08-18 — End: 2021-08-18

## 2021-08-18 MED ORDER — TRIAMCINOLONE ACETONIDE 0.5 % EX OINT: 1.0000 "application " | TOPICAL_OINTMENT | Freq: Two times a day (BID) | CUTANEOUS | 0 refills | Status: DC

## 2021-08-18 NOTE — Patient Instructions (Signed)
Tina Molina , ?Thank you for taking time to come for your Medicare Wellness Visit. I appreciate your ongoing commitment to your health goals. Please review the following plan we discussed and let me know if I can assist you in the future.  ? ?Screening recommendations/referrals: ?Colonoscopy: 10/26/2019; no repeat due to age ?Mammogram: 07/28/2018; due every 1-2 years ?Bone Density: 07/28/2018; due every  5 years ?Recommended yearly ophthalmology/optometry visit for glaucoma screening and checkup ?Recommended yearly dental visit for hygiene and checkup ? ?Vaccinations: ?Influenza vaccine: 02/16/2021 ?Pneumococcal vaccine: 05/30/2013, 01/13/2015 ?Tdap vaccine: 06/01/2009; due every 10 years ?Shingles vaccine: declined   ?Covid-19: 07/26/2019, 08/16/2019, 03/28/2020, 09/19/2020, 02/17/2021 ? ?Advanced directives: Advance directive discussed with you today. Even though you declined this today please call our office should you change your mind and we can give you the proper paperwork for you to fill out. ? ?Conditions/risks identified: Yes ? ?Next appointment: Please schedule your next Medicare Wellness Visit with your Nurse Health Advisor in 1 year or 366 days by calling [phone]. ? ? ?Preventive Care 27 Years and Older, Female ?Preventive care refers to lifestyle choices and visits with your health care provider that can promote health and wellness. ?What does preventive care include? ?A yearly physical exam. This is also called an annual well check. ?Dental exams once or twice a year. ?Routine eye exams. Ask your health care provider how often you should have your eyes checked. ?Personal lifestyle choices, including: ?Daily care of your teeth and gums. ?Regular physical activity. ?Eating a healthy diet. ?Avoiding tobacco and drug use. ?Limiting alcohol use. ?Practicing safe sex. ?Taking low-dose aspirin every day. ?Taking vitamin and mineral supplements as recommended by your health care provider. ?What happens during an annual  well check? ?The services and screenings done by your health care provider during your annual well check will depend on your age, overall health, lifestyle risk factors, and family history of disease. ?Counseling  ?Your health care provider may ask you questions about your: ?Alcohol use. ?Tobacco use. ?Drug use. ?Emotional well-being. ?Home and relationship well-being. ?Sexual activity. ?Eating habits. ?History of falls. ?Memory and ability to understand (cognition). ?Work and work Statistician. ?Reproductive health. ?Screening  ?You may have the following tests or measurements: ?Height, weight, and BMI. ?Blood pressure. ?Lipid and cholesterol levels. These may be checked every 5 years, or more frequently if you are over 84 years old. ?Skin check. ?Lung cancer screening. You may have this screening every year starting at age 35 if you have a 30-pack-year history of smoking and currently smoke or have quit within the past 15 years. ?Fecal occult blood test (FOBT) of the stool. You may have this test every year starting at age 94. ?Flexible sigmoidoscopy or colonoscopy. You may have a sigmoidoscopy every 5 years or a colonoscopy every 10 years starting at age 47. ?Hepatitis C blood test. ?Hepatitis B blood test. ?Sexually transmitted disease (STD) testing. ?Diabetes screening. This is done by checking your blood sugar (glucose) after you have not eaten for a while (fasting). You may have this done every 1-3 years. ?Bone density scan. This is done to screen for osteoporosis. You may have this done starting at age 54. ?Mammogram. This may be done every 1-2 years. Talk to your health care provider about how often you should have regular mammograms. ?Talk with your health care provider about your test results, treatment options, and if necessary, the need for more tests. ?Vaccines  ?Your health care provider may recommend certain vaccines, such as: ?Influenza  vaccine. This is recommended every year. ?Tetanus, diphtheria,  and acellular pertussis (Tdap, Td) vaccine. You may need a Td booster every 10 years. ?Zoster vaccine. You may need this after age 100. ?Pneumococcal 13-valent conjugate (PCV13) vaccine. One dose is recommended after age 34. ?Pneumococcal polysaccharide (PPSV23) vaccine. One dose is recommended after age 16. ?Talk to your health care provider about which screenings and vaccines you need and how often you need them. ?This information is not intended to replace advice given to you by your health care provider. Make sure you discuss any questions you have with your health care provider. ?Document Released: 05/30/2015 Document Revised: 01/21/2016 Document Reviewed: 03/04/2015 ?Elsevier Interactive Patient Education ? 2017 [place]. ? ?Fall Prevention in the Home ?Falls can cause injuries. They can happen to people of all ages. There are many things you can do to make your home safe and to help prevent falls. ?What can I do on the outside of my home? ?Regularly fix the edges of walkways and driveways and fix any cracks. ?Remove anything that might make you trip as you walk through a door, such as a raised step or threshold. ?Trim any bushes or trees on the path to your home. ?Use bright outdoor lighting. ?Clear any walking paths of anything that might make someone trip, such as rocks or tools. ?Regularly check to see if handrails are loose or broken. Make sure that both sides of any steps have handrails. ?Any raised decks and porches should have guardrails on the edges. ?Have any leaves, snow, or ice cleared regularly. ?Use sand or salt on walking paths during winter. ?Clean up any spills in your garage right away. This includes oil or grease spills. ?What can I do in the bathroom? ?Use night lights. ?Install grab bars by the toilet and in the tub and shower. Do not use towel bars as grab bars. ?Use non-skid mats or decals in the tub or shower. ?If you need to sit down in the shower, use a plastic, non-slip  stool. ?Keep the floor dry. Clean up any water that spills on the floor as soon as it happens. ?Remove soap buildup in the tub or shower regularly. ?Attach bath mats securely with double-sided non-slip rug tape. ?Do not have throw rugs and other things on the floor that can make you trip. ?What can I do in the bedroom? ?Use night lights. ?Make sure that you have a light by your bed that is easy to reach. ?Do not use any sheets or blankets that are too big for your bed. They should not hang down onto the floor. ?Have a firm chair that has side arms. You can use this for support while you get dressed. ?Do not have throw rugs and other things on the floor that can make you trip. ?What can I do in the kitchen? ?Clean up any spills right away. ?Avoid walking on wet floors. ?Keep items that you use a lot in easy-to-reach places. ?If you need to reach something above you, use a strong step stool that has a grab bar. ?Keep electrical cords out of the way. ?Do not use floor polish or wax that makes floors slippery. If you must use wax, use non-skid floor wax. ?Do not have throw rugs and other things on the floor that can make you trip. ?What can I do with my stairs? ?Do not leave any items on the stairs. ?Make sure that there are handrails on both sides of the stairs and use them. Fix  handrails that are broken or loose. Make sure that handrails are as long as the stairways. ?Check any carpeting to make sure that it is firmly attached to the stairs. Fix any carpet that is loose or worn. ?Avoid having throw rugs at the top or bottom of the stairs. If you do have throw rugs, attach them to the floor with carpet tape. ?Make sure that you have a light switch at the top of the stairs and the bottom of the stairs. If you do not have them, ask someone to add them for you. ?What else can I do to help prevent falls? ?Wear shoes that: ?Do not have high heels. ?Have rubber bottoms. ?Are comfortable and fit you well. ?Are closed at the  toe. Do not wear sandals. ?If you use a stepladder: ?Make sure that it is fully opened. Do not climb a closed stepladder. ?Make sure that both sides of the stepladder are locked into place. ?Ask someone to hold

## 2021-08-18 NOTE — Assessment & Plan Note (Signed)
Chronic °Regular exercise and healthy diet encouraged °Check lipid panel  °Continue atorvastatin 20 mg daily °

## 2021-08-18 NOTE — Assessment & Plan Note (Signed)
Chronic  ?Clinically euthyroid ?Currently taking levothyroxine 200 mcg daily ?Check tsh  ?Titrate med dose if needed ?

## 2021-08-18 NOTE — Progress Notes (Signed)
? ?Subjective:  ? Tina Molina is a 76 y.o. female who presents for Medicare Annual (Subsequent) preventive examination. ? ?Review of Systems    ? ?Cardiac Risk Factors include: advanced age (>51mn, >>46women);dyslipidemia;family history of premature cardiovascular disease;hypertension;obesity (BMI >30kg/m2) ? ?   ?Objective:  ?  ?Today's Vitals  ? 08/18/21 1304 08/18/21 1328  ?BP: (!) 160/70 140/60  ?Pulse: 68   ?Temp: (!) 97.5 ?F (36.4 ?C)   ?TempSrc: Temporal   ?SpO2: 96%   ?Weight: 200 lb 6.4 oz (90.9 kg)   ?Height: '5\' 4"'$  (1.626 m)   ?PainSc: 0-No pain   ? ?Body mass index is 34.4 kg/m?. ? ? ?  08/18/2021  ?  1:15 PM 07/24/2021  ?  2:03 PM 04/24/2021  ?  2:12 PM 01/22/2021  ?  1:13 PM 10/15/2020  ?  2:08 PM 05/15/2020  ?  1:29 PM 05/13/2020  ? 11:27 AM  ?Advanced Directives  ?Does Patient Have a Medical Advance Directive? No No No No No No No  ?Would patient like information on creating a medical advance directive? No - Patient declined No - Patient declined No - Patient declined No - Patient declined No - Patient declined No - Patient declined No - Patient declined  ? ? ?Current Medications (verified) ?Outpatient Encounter Medications as of 08/18/2021  ?Medication Sig  ? aspirin 81 MG tablet Take 81 mg by mouth daily.  ? atorvastatin (LIPITOR) 20 MG tablet TAKE ONE TABLET BY MOUTH ONCE DAILY  ? budesonide-formoterol (SYMBICORT) 160-4.5 MCG/ACT inhaler inhale 2 puffs if needed for wheezing  ? carvedilol (COREG) 25 MG tablet TAKE ONE TABLET BY MOUTH TWICE DAILY  ? cyclobenzaprine (FLEXERIL) 10 MG tablet TAKE ONE TABLET BY MOUTH THREE TIMES DAILY  ? famotidine (PEPCID) 40 MG tablet TAKE ONE TABLET BY MOUTH TWICE DAILY  ? furosemide (LASIX) 20 MG tablet TAKE ONE TABLET BY MOUTH ONCE DAILY  ? gabapentin (NEURONTIN) 300 MG capsule TAKE ONE CAPSULE BY MOUTH THREE TIMES DAILY  ? hydrALAZINE (APRESOLINE) 50 MG tablet TAKE ONE TABLET BY MOUTH THREE TIMES DAILY  ? latanoprost (XALATAN) 0.005 % ophthalmic solution 1 drop at  bedtime.  ? leflunomide (ARAVA) 20 MG tablet Take 20 mg by mouth daily.  ? levothyroxine (SYNTHROID) 200 MCG tablet TAKE ONE TABLET BY MOUTH BEFORE BREAKFAST  ? MELATONIN GUMMIES PO Take by mouth at bedtime as needed.  ? Multiple Vitamin (MULTIVITAMIN WITH MINERALS) TABS Take 1 tablet by mouth daily.  ? spironolactone (ALDACTONE) 25 MG tablet TAKE ONE TABLET BY MOUTH ONCE DAILY  ? telmisartan (MICARDIS) 40 MG tablet TAKE ONE TABLET BY MOUTH DAILY  ? vitamin E 180 MG (400 UNITS) capsule 1 capsule  ? vitamin E 400 UNIT capsule Take 400 Units by mouth daily.   ? [DISCONTINUED] modafinil (PROVIGIL) 100 MG tablet Take 100 mg by mouth daily.  ? [DISCONTINUED] potassium chloride (MICRO-K) 10 MEQ CR capsule Take 1 capsule (10 mEq total) by mouth 2 (two) times daily.  ? ?No facility-administered encounter medications on file as of 08/18/2021.  ? ? ?Allergies (verified) ?Penicillins, Shellfish allergy, Shellfish-derived products, Codeine, Infliximab, Sulfonamide derivatives, Iodine, and Azithromycin  ? ?History: ?Past Medical History:  ?Diagnosis Date  ? Anemia   ? Associated with leukopenia and lymphocytosis. This is been evaluated by Dr. EMarin Olp. Her platelet count has been normal  ? Arthritis   ? Chronic back pain   ? Chronic kidney disease   ? Erythropoietin deficiency anemia 02/23/2016  ? GERD (gastroesophageal  reflux disease)   ? Hypertension   ? Lymphocytosis   ? MGUS (monoclonal gammopathy of unknown significance) 08/03/2012  ? IgA 792 11/04/11  ? Pneumonia 04/2010  ? OP  ? Renal insufficiency   ? Sarcoidosis   ? Thyroid disease   ? ?Past Surgical History:  ?Procedure Laterality Date  ? CARPAL TUNNEL RELEASE    ? Bilateral   ? CATARACT EXTRACTION Right 05/2015  ? CATARACT EXTRACTION Left   ? CERVICAL DISCECTOMY  06/2010  ? Dr.Pool  ? CHOLECYSTECTOMY    ? COLONOSCOPY  2010  ? POSTERIOR LUMBAR FUSION  02/29/2012  ? Dr Alyson Locket op respiratory & renal compromise  ? THYROIDECTOMY  2003  ? For goiter  ? UPPER GASTROINTESTINAL  ENDOSCOPY  12/01/2011  ? DB  ? ?Family History  ?Problem Relation Age of Onset  ? Anemia Father   ? Arthritis Father   ? Heart failure Father 49  ?     No CAD  ? Diabetes Mother   ? Arthritis Mother   ? Glaucoma Mother   ? Heart disease Mother 64  ?     No CAD  ? Hypertension Brother   ? Anemia Brother   ? Kidney disease Brother   ? Hypertension Sister   ? Kidney disease Sister   ? Anemia Sister   ? Diabetes Maternal Aunt   ? Arthritis Paternal Grandmother   ? Colon cancer Neg Hx   ? Colon polyps Neg Hx   ? Esophageal cancer Neg Hx   ? Rectal cancer Neg Hx   ? Stomach cancer Neg Hx   ? ?Social History  ? ?Socioeconomic History  ? Marital status: Divorced  ?  Spouse name: Not on file  ? Number of children: 0  ? Years of education: 52  ? Highest education level: Not on file  ?Occupational History  ? Occupation: Retired  ?  Employer: RETIRED  ?Tobacco Use  ? Smoking status: Former  ?  Packs/day: 0.50  ?  Years: 20.00  ?  Pack years: 10.00  ?  Types: Cigarettes  ?  Quit date: 05/17/1997  ?  Years since quitting: 24.2  ? Smokeless tobacco: Never  ?Vaping Use  ? Vaping Use: Never used  ?Substance and Sexual Activity  ? Alcohol use: Yes  ?  Alcohol/week: 1.0 standard drink  ?  Types: 1 Glasses of wine per week  ?  Comment: occasionally , less than weekly  ? Drug use: No  ? Sexual activity: Not Currently  ?Other Topics Concern  ? Not on file  ?Social History Narrative  ? Patient is divorced and lives with her sister. Patient is retired.  ? Caffeine- sometimes - one cup  ? Right handed.  ? ?Social Determinants of Health  ? ?Financial Resource Strain: Low Risk   ? Difficulty of Paying Living Expenses: Not hard at all  ?Food Insecurity: No Food Insecurity  ? Worried About Charity fundraiser in the Last Year: Never true  ? Ran Out of Food in the Last Year: Never true  ?Transportation Needs: No Transportation Needs  ? Lack of Transportation (Medical): No  ? Lack of Transportation (Non-Medical): No  ?Physical Activity: Inactive  ?  Days of Exercise per Week: 0 days  ? Minutes of Exercise per Session: 0 min  ?Stress: No Stress Concern Present  ? Feeling of Stress : Not at all  ?Social Connections: Moderately Integrated  ? Frequency of Communication with Friends and Family: More than three  times a week  ? Frequency of Social Gatherings with Friends and Family: More than three times a week  ? Attends Religious Services: More than 4 times per year  ? Active Member of Clubs or Organizations: Yes  ? Attends Archivist Meetings: More than 4 times per year  ? Marital Status: Divorced  ? ? ?Tobacco Counseling ?Counseling given: Not Answered ? ? ?Clinical Intake: ? ?Pre-visit preparation completed: Yes ? ?Pain : No/denies pain ?Pain Score: 0-No pain ? ?  ? ?BMI - recorded: 34.4 ?Nutritional Status: BMI > 30  Obese ?Nutritional Risks: None ?Diabetes: No ? ?How often do you need to have someone help you when you read instructions, pamphlets, or other written materials from your doctor or pharmacy?: 1 - Never ?What is the last grade level you completed in school?: HSG ? ?Diabetic? no ? ?Interpreter Needed?: No ? ?Information entered by :: Lisette Abu, LPN ? ? ?Activities of Daily Living ? ?  08/18/2021  ?  1:34 PM  ?In your present state of health, do you have any difficulty performing the following activities:  ?Hearing? 0  ?Vision? 0  ?Difficulty concentrating or making decisions? 0  ?Walking or climbing stairs? 0  ?Dressing or bathing? 0  ?Doing errands, shopping? 0  ?Preparing Food and eating ? N  ?Using the Toilet? N  ?In the past six months, have you accidently leaked urine? N  ?Do you have problems with loss of bowel control? N  ?Managing your Medications? N  ?Managing your Finances? N  ?Housekeeping or managing your Housekeeping? N  ? ? ?Patient Care Team: ?Binnie Rail, MD as PCP - General (Internal Medicine) ?Foltanski, Cleaster Corin, [facility] as Pharmacist (Pharmacist) ?Camillo Flaming, OD as Referring Physician (Optometry) ? ?Indicate any  recent Medical Services you may have received from other than [facility] providers in the past year (date may be approximate). ? ?   ?Assessment:  ? This is a routine wellness examination for Yarelly. ? ?Hearing/

## 2021-08-18 NOTE — Assessment & Plan Note (Signed)
New ?Started about 1 month ago ?She denies any itching and the only pain she has is the area on her right thumb where the skin is ulcerated ?Papules/blisters on not painful ?Involves primarily the right hand, but has a lesion or 2 on the left hand ?Has applied peroxide and alcohol without much improvement ?Looks similar to dyshidrotic eczema, but not itchy so not sure exactly what the cause is ?Trial of triamcinolone 0.5% cream twice daily-she will update me on if there is improvement after several days and if there is not will refer to dermatology ?

## 2021-08-18 NOTE — Assessment & Plan Note (Signed)
Chronic Check a1c Low sugar / carb diet Stressed regular exercise  

## 2021-08-18 NOTE — Assessment & Plan Note (Signed)
Chronic GERD controlled Continue famotidine 40 mg twice daily 

## 2021-08-18 NOTE — Assessment & Plan Note (Signed)
Chronic Following with nephrology CMP 

## 2021-08-18 NOTE — Assessment & Plan Note (Signed)
Chronic ?Blood pressure well controlled ?CMP ?Continue Coreg 25 mg twice daily, hydralazine 50 mg 3 times daily, spironolactone 25 mg daily, telmisartan 40 mg daily ?

## 2021-08-19 MED ORDER — LEVOTHYROXINE SODIUM 175 MCG PO TABS: 175.0000 ug | ORAL_TABLET | Freq: Every day | ORAL | 3 refills | Status: DC

## 2021-08-19 NOTE — Addendum Note (Signed)
Addended by: Binnie Rail on: 08/19/2021 12:44 PM ? ? Modules accepted: Orders ? ?

## 2021-08-20 NOTE — Telephone Encounter (Signed)
Pt called in for the results. I advised pt Dr. Quay Burow results note stated Thyroid function is a little high.  We need to decrease your dose to 175 mcg daily.  She would like to recheck your TSH in 2 months.  New prescription sent to pharmacy.   ?  ? ?Understood and had no further questions ? ?FYI ?

## 2021-08-23 ENCOUNTER — Other Ambulatory Visit: Payer: Self-pay | Admitting: Internal Medicine

## 2021-08-23 DIAGNOSIS — I10 Essential (primary) hypertension: Secondary | ICD-10-CM

## 2021-08-24 ENCOUNTER — Telehealth: Payer: Self-pay | Admitting: Internal Medicine

## 2021-08-24 NOTE — Telephone Encounter (Signed)
Pt called to report that triamcinolone ointment (KENALOG) 0.5 % is working but has not cleared the issue 100%.  ? ?States it is no longer blistering. Wanted to follow up with MD.  ?

## 2021-08-24 NOTE — Telephone Encounter (Signed)
Called patient to inform her of provider recommendation. Patient verbalize understanding. No further questions ?

## 2021-08-24 NOTE — Telephone Encounter (Signed)
She can continue cream for another week and hopefully that will clear it up completely.  If it does not I think she needs to see a dermatologist. ?

## 2021-08-31 DIAGNOSIS — H40053 Ocular hypertension, bilateral: Secondary | ICD-10-CM | POA: Diagnosis not present

## 2021-09-04 ENCOUNTER — Inpatient Hospital Stay: Payer: Medicare Other | Attending: Hematology & Oncology

## 2021-09-04 ENCOUNTER — Inpatient Hospital Stay: Payer: Medicare Other

## 2021-09-04 VITALS — BP 104/39 | HR 95 | Temp 98.2°F | Resp 17

## 2021-09-04 DIAGNOSIS — D631 Anemia in chronic kidney disease: Secondary | ICD-10-CM | POA: Insufficient documentation

## 2021-09-04 DIAGNOSIS — N181 Chronic kidney disease, stage 1: Secondary | ICD-10-CM

## 2021-09-04 DIAGNOSIS — N182 Chronic kidney disease, stage 2 (mild): Secondary | ICD-10-CM | POA: Diagnosis not present

## 2021-09-04 DIAGNOSIS — D508 Other iron deficiency anemias: Secondary | ICD-10-CM

## 2021-09-04 LAB — CBC WITH DIFFERENTIAL (CANCER CENTER ONLY)
Abs Immature Granulocytes: 0.01 10*3/uL (ref 0.00–0.07)
Basophils Absolute: 0 10*3/uL (ref 0.0–0.1)
Basophils Relative: 1 %
Eosinophils Absolute: 0.2 10*3/uL (ref 0.0–0.5)
Eosinophils Relative: 4 %
HCT: 32.7 % — ABNORMAL LOW (ref 36.0–46.0)
Hemoglobin: 10.5 g/dL — ABNORMAL LOW (ref 12.0–15.0)
Immature Granulocytes: 0 %
Lymphocytes Relative: 62 %
Lymphs Abs: 2.9 10*3/uL (ref 0.7–4.0)
MCH: 31.3 pg (ref 26.0–34.0)
MCHC: 32.1 g/dL (ref 30.0–36.0)
MCV: 97.6 fL (ref 80.0–100.0)
Monocytes Absolute: 0.6 10*3/uL (ref 0.1–1.0)
Monocytes Relative: 13 %
Neutro Abs: 0.9 10*3/uL — ABNORMAL LOW (ref 1.7–7.7)
Neutrophils Relative %: 20 %
Platelet Count: 153 10*3/uL (ref 150–400)
RBC: 3.35 MIL/uL — ABNORMAL LOW (ref 3.87–5.11)
RDW: 14.2 % (ref 11.5–15.5)
WBC Count: 4.6 10*3/uL (ref 4.0–10.5)
nRBC: 0 % (ref 0.0–0.2)

## 2021-09-04 LAB — CMP (CANCER CENTER ONLY)
ALT: 10 U/L (ref 0–44)
AST: 14 U/L — ABNORMAL LOW (ref 15–41)
Albumin: 4.1 g/dL (ref 3.5–5.0)
Alkaline Phosphatase: 70 U/L (ref 38–126)
Anion gap: 6 (ref 5–15)
BUN: 41 mg/dL — ABNORMAL HIGH (ref 8–23)
CO2: 27 mmol/L (ref 22–32)
Calcium: 9.4 mg/dL (ref 8.9–10.3)
Chloride: 109 mmol/L (ref 98–111)
Creatinine: 2.27 mg/dL — ABNORMAL HIGH (ref 0.44–1.00)
GFR, Estimated: 22 mL/min — ABNORMAL LOW (ref >60)
Glucose, Bld: 92 mg/dL (ref 70–99)
Potassium: 5.1 mmol/L (ref 3.5–5.1)
Sodium: 142 mmol/L (ref 135–145)
Total Bilirubin: 0.3 mg/dL (ref 0.3–1.2)
Total Protein: 7.2 g/dL (ref 6.5–8.1)

## 2021-09-04 MED ORDER — DARBEPOETIN ALFA 300 MCG/0.6ML IJ SOSY
300.0000 ug | PREFILLED_SYRINGE | Freq: Once | INTRAMUSCULAR | Status: AC
  Administered 2021-09-04: 300 ug via SUBCUTANEOUS
  Filled 2021-09-04: qty 0.6

## 2021-09-04 NOTE — Patient Instructions (Signed)
Darbepoetin Alfa injection ?What is this medication? ?DARBEPOETIN ALFA (dar be POE e tin  AL fa) helps your body make more red blood cells. It is used to treat anemia caused by chronic kidney failure and chemotherapy. ?This medicine may be used for other purposes; ask your health care provider or pharmacist if you have questions. ?COMMON BRAND NAME(S): Aranesp ?What should I tell my care team before I take this medication? ?They need to know if you have any of these conditions: ?blood clotting disorders or history of blood clots ?cancer patient not on chemotherapy ?cystic fibrosis ?heart disease, such as angina, heart failure, or a history of a heart attack ?hemoglobin level of 12 g/dL or greater ?high blood pressure ?low levels of folate, iron, or vitamin B12 ?seizures ?an unusual or allergic reaction to darbepoetin, erythropoietin, albumin, hamster proteins, latex, other medicines, foods, dyes, or preservatives ?pregnant or trying to get pregnant ?breast-feeding ?How should I use this medication? ?This medicine is for injection into a vein or under the skin. It is usually given by a health care professional in a hospital or clinic setting. ?If you get this medicine at home, you will be taught how to prepare and give this medicine. Use exactly as directed. Take your medicine at regular intervals. Do not take your medicine more often than directed. ?It is important that you put your used needles and syringes in a special sharps container. Do not put them in a trash can. If you do not have a sharps container, call your pharmacist or healthcare provider to get one. ?A special MedGuide will be given to you by the pharmacist with each prescription and refill. Be sure to read this information carefully each time. ?Talk to your pediatrician regarding the use of this medicine in children. While this medicine may be used in children as young as 1 month of age for selected conditions, precautions do apply. ?Overdosage: If  you think you have taken too much of this medicine contact a poison control center or emergency room at once. ?NOTE: This medicine is only for you. Do not share this medicine with others. ?What if I miss a dose? ?If you miss a dose, take it as soon as you can. If it is almost time for your next dose, take only that dose. Do not take double or extra doses. ?What may interact with this medication? ?Do not take this medicine with any of the following medications: ?epoetin alfa ?This list may not describe all possible interactions. Give your health care provider a list of all the medicines, herbs, non-prescription drugs, or dietary supplements you use. Also tell them if you smoke, drink alcohol, or use illegal drugs. Some items may interact with your medicine. ?What should I watch for while using this medication? ?Your condition will be monitored carefully while you are receiving this medicine. ?You may need blood work done while you are taking this medicine. ?This medicine may cause a decrease in vitamin B6. You should make sure that you get enough vitamin B6 while you are taking this medicine. Discuss the foods you eat and the vitamins you take with your health care professional. ?What side effects may I notice from receiving this medication? ?Side effects that you should report to your doctor or health care professional as soon as possible: ?allergic reactions like skin rash, itching or hives, swelling of the face, lips, or tongue ?breathing problems ?changes in vision ?chest pain ?confusion, trouble speaking or understanding ?feeling faint or lightheaded, falls ?high blood   pressure ?muscle aches or pains ?pain, swelling, warmth in the leg ?rapid weight gain ?severe headaches ?sudden numbness or weakness of the face, arm or leg ?trouble walking, dizziness, loss of balance or coordination ?seizures (convulsions) ?swelling of the ankles, feet, hands ?unusually weak or tired ?Side effects that usually do not require  medical attention (report to your doctor or health care professional if they continue or are bothersome): ?diarrhea ?fever, chills (flu-like symptoms) ?headaches ?nausea, vomiting ?redness, stinging, or swelling at site where injected ?This list may not describe all possible side effects. Call your doctor for medical advice about side effects. You may report side effects to FDA at 1-800-FDA-1088. ?Where should I keep my medication? ?Keep out of the reach of children and pets. ?Store in a refrigerator. Do not freeze. Do not shake. Throw away any unused portion if using a single-dose vial. Multi-dose vials can be kept in the refrigerator for up to 21 days after the initial dose. Throw away unused medicine. ?To get rid of medications that are no longer needed or have expired: ?Take the medication to a medication take-back program. Check with your pharmacy or law enforcement to find a location. ?If you cannot return the medication, ask your pharmacist or care team how to get rid of the medication safely. ?NOTE: This sheet is a summary. It may not cover all possible information. If you have questions about this medicine, talk to your doctor, pharmacist, or health care provider. ?? 2023 Elsevier/Gold Standard (2021-05-04 00:00:00) ? ?

## 2021-09-28 ENCOUNTER — Other Ambulatory Visit: Payer: Self-pay | Admitting: Internal Medicine

## 2021-09-28 DIAGNOSIS — Z1231 Encounter for screening mammogram for malignant neoplasm of breast: Secondary | ICD-10-CM

## 2021-10-05 DIAGNOSIS — N184 Chronic kidney disease, stage 4 (severe): Secondary | ICD-10-CM | POA: Diagnosis not present

## 2021-10-05 DIAGNOSIS — M1991 Primary osteoarthritis, unspecified site: Secondary | ICD-10-CM | POA: Diagnosis not present

## 2021-10-05 DIAGNOSIS — M0589 Other rheumatoid arthritis with rheumatoid factor of multiple sites: Secondary | ICD-10-CM | POA: Diagnosis not present

## 2021-10-05 DIAGNOSIS — D860 Sarcoidosis of lung: Secondary | ICD-10-CM | POA: Diagnosis not present

## 2021-10-05 DIAGNOSIS — E669 Obesity, unspecified: Secondary | ICD-10-CM | POA: Diagnosis not present

## 2021-10-05 DIAGNOSIS — Z6834 Body mass index (BMI) 34.0-34.9, adult: Secondary | ICD-10-CM | POA: Diagnosis not present

## 2021-10-05 DIAGNOSIS — M5136 Other intervertebral disc degeneration, lumbar region: Secondary | ICD-10-CM | POA: Diagnosis not present

## 2021-10-05 DIAGNOSIS — M25512 Pain in left shoulder: Secondary | ICD-10-CM | POA: Diagnosis not present

## 2021-10-05 DIAGNOSIS — R21 Rash and other nonspecific skin eruption: Secondary | ICD-10-CM | POA: Diagnosis not present

## 2021-10-06 ENCOUNTER — Ambulatory Visit
Admission: RE | Admit: 2021-10-06 | Discharge: 2021-10-06 | Disposition: A | Discharge status: Home / Self Care | Payer: Medicare Other | Source: Ambulatory Visit | Attending: Internal Medicine | Admitting: Internal Medicine

## 2021-10-06 DIAGNOSIS — Z1231 Encounter for screening mammogram for malignant neoplasm of breast: Secondary | ICD-10-CM

## 2021-10-08 ENCOUNTER — Other Ambulatory Visit: Payer: Self-pay | Admitting: Internal Medicine

## 2021-10-08 DIAGNOSIS — R928 Other abnormal and inconclusive findings on diagnostic imaging of breast: Secondary | ICD-10-CM

## 2021-10-16 ENCOUNTER — Ambulatory Visit: Payer: Medicare Other

## 2021-10-16 ENCOUNTER — Other Ambulatory Visit: Payer: Medicare Other

## 2021-10-16 ENCOUNTER — Inpatient Hospital Stay (HOSPITAL_BASED_OUTPATIENT_CLINIC_OR_DEPARTMENT_OTHER): Payer: Medicare Other | Admitting: Family

## 2021-10-16 ENCOUNTER — Inpatient Hospital Stay: Payer: Medicare Other

## 2021-10-16 ENCOUNTER — Other Ambulatory Visit: Payer: Self-pay

## 2021-10-16 ENCOUNTER — Encounter: Payer: Self-pay | Admitting: Family

## 2021-10-16 ENCOUNTER — Inpatient Hospital Stay: Payer: Medicare Other | Attending: Hematology & Oncology

## 2021-10-16 ENCOUNTER — Telehealth: Payer: Self-pay | Admitting: *Deleted

## 2021-10-16 VITALS — BP 128/50 | HR 85 | Temp 97.9°F | Resp 18 | Ht 64.0 in | Wt 202.1 lb

## 2021-10-16 DIAGNOSIS — D508 Other iron deficiency anemias: Secondary | ICD-10-CM

## 2021-10-16 DIAGNOSIS — D509 Iron deficiency anemia, unspecified: Secondary | ICD-10-CM | POA: Insufficient documentation

## 2021-10-16 DIAGNOSIS — D472 Monoclonal gammopathy: Secondary | ICD-10-CM | POA: Diagnosis not present

## 2021-10-16 DIAGNOSIS — N184 Chronic kidney disease, stage 4 (severe): Secondary | ICD-10-CM | POA: Insufficient documentation

## 2021-10-16 DIAGNOSIS — N182 Chronic kidney disease, stage 2 (mild): Secondary | ICD-10-CM | POA: Diagnosis not present

## 2021-10-16 DIAGNOSIS — E611 Iron deficiency: Secondary | ICD-10-CM | POA: Diagnosis not present

## 2021-10-16 DIAGNOSIS — D631 Anemia in chronic kidney disease: Secondary | ICD-10-CM

## 2021-10-16 LAB — RETICULOCYTES
Immature Retic Fract: 8 % (ref 2.3–15.9)
RBC.: 3.44 MIL/uL — ABNORMAL LOW (ref 3.87–5.11)
Retic Count, Absolute: 40.9 10*3/uL (ref 19.0–186.0)
Retic Ct Pct: 1.2 % (ref 0.4–3.1)

## 2021-10-16 LAB — CMP (CANCER CENTER ONLY)
ALT: 19 U/L (ref 0–44)
AST: 20 U/L (ref 15–41)
Albumin: 4.3 g/dL (ref 3.5–5.0)
Alkaline Phosphatase: 72 U/L (ref 38–126)
Anion gap: 8 (ref 5–15)
BUN: 38 mg/dL — ABNORMAL HIGH (ref 8–23)
CO2: 24 mmol/L (ref 22–32)
Calcium: 9.3 mg/dL (ref 8.9–10.3)
Chloride: 109 mmol/L (ref 98–111)
Creatinine: 1.8 mg/dL — ABNORMAL HIGH (ref 0.44–1.00)
GFR, Estimated: 29 mL/min — ABNORMAL LOW (ref >60)
Glucose, Bld: 122 mg/dL — ABNORMAL HIGH (ref 70–99)
Potassium: 4.2 mmol/L (ref 3.5–5.1)
Sodium: 141 mmol/L (ref 135–145)
Total Bilirubin: 0.3 mg/dL (ref 0.3–1.2)
Total Protein: 7.2 g/dL (ref 6.5–8.1)

## 2021-10-16 LAB — CBC WITH DIFFERENTIAL (CANCER CENTER ONLY)
Abs Immature Granulocytes: 0.03 10*3/uL (ref 0.00–0.07)
Basophils Absolute: 0 10*3/uL (ref 0.0–0.1)
Basophils Relative: 1 %
Eosinophils Absolute: 0.2 10*3/uL (ref 0.0–0.5)
Eosinophils Relative: 4 %
HCT: 33.4 % — ABNORMAL LOW (ref 36.0–46.0)
Hemoglobin: 10.6 g/dL — ABNORMAL LOW (ref 12.0–15.0)
Immature Granulocytes: 1 %
Lymphocytes Relative: 55 %
Lymphs Abs: 2.5 10*3/uL (ref 0.7–4.0)
MCH: 30.9 pg (ref 26.0–34.0)
MCHC: 31.7 g/dL (ref 30.0–36.0)
MCV: 97.4 fL (ref 80.0–100.0)
Monocytes Absolute: 0.6 10*3/uL (ref 0.1–1.0)
Monocytes Relative: 13 %
Neutro Abs: 1.2 10*3/uL — ABNORMAL LOW (ref 1.7–7.7)
Neutrophils Relative %: 26 %
Platelet Count: 165 10*3/uL (ref 150–400)
RBC: 3.43 MIL/uL — ABNORMAL LOW (ref 3.87–5.11)
RDW: 14.3 % (ref 11.5–15.5)
WBC Count: 4.5 10*3/uL (ref 4.0–10.5)
nRBC: 0 % (ref 0.0–0.2)

## 2021-10-16 LAB — IRON AND IRON BINDING CAPACITY (CC-WL,HP ONLY)
Iron: 98 ug/dL (ref 28–170)
Saturation Ratios: 30 % (ref 10.4–31.8)
TIBC: 330 ug/dL (ref 250–450)
UIBC: 232 ug/dL

## 2021-10-16 LAB — FERRITIN: Ferritin: 138 ng/mL (ref 11–307)

## 2021-10-16 MED ORDER — DARBEPOETIN ALFA 300 MCG/0.6ML IJ SOSY
300.0000 ug | PREFILLED_SYRINGE | Freq: Once | INTRAMUSCULAR | Status: AC
  Administered 2021-10-16: 300 ug via SUBCUTANEOUS
  Filled 2021-10-16: qty 0.6

## 2021-10-16 NOTE — Progress Notes (Signed)
Hematology and Oncology Follow Up Visit  Tina Molina [id number] Sep 09, 1945 76 y.o. 10/16/2021   Principle Diagnosis:  Anemia of chronic renal failure - stage II MGUS - IgA lambda Iron deficiency anemia   Current Therapy:        Aranesp 300 mcg SQ to keep Hgb > 11 IV iron as indicated   Interim History:  Tina Molina is here today for follow-up. She is doing well but does note persistent mild fatigue.  No blood loss, bruising or petechiae noted.  She notes occasional palpitations.  No fever, chills, n/v, cough, rash, dizziness, SOB, chest pain, palpitations, abdominal pain or changes in bowel or bladder habits.  She has intermittent swelling and tingling in her hands and feet with arthritis and lower back issues.  No falls or syncope to report.  Appetite and hydration are good. Weight is stable at 202 lbs.   ECOG Performance Status: 1 - Symptomatic but completely ambulatory  Medications:  Allergies as of 10/16/2021       Reactions   Penicillins Other (See Comments)   Intolerance or allergy not remembered; her sister stated penicillin caused "boils under the skin"   Shellfish Allergy Rash   Shellfish-derived Products Anaphylaxis   Codeine Nausea Only   Nausea only with liquid codeine   Infliximab Other (See Comments)    Confusion (intolerance) Knots under the skin   Sulfonamide Derivatives Rash   Joint pain   Iodine    Azithromycin Itching        Medication List        Accurate as of October 16, 2021  1:11 PM. If you have any questions, ask your nurse or doctor.          aspirin 81 MG tablet Take 81 mg by mouth daily.   atorvastatin 20 MG tablet Commonly known as: LIPITOR TAKE ONE TABLET BY MOUTH ONCE DAILY   budesonide-formoterol 160-4.5 MCG/ACT inhaler Commonly known as: Symbicort inhale 2 puffs if needed for wheezing   carvedilol 25 MG tablet Commonly known as: COREG TAKE ONE TABLET BY MOUTH TWICE DAILY   cyclobenzaprine 10 MG tablet Commonly known  as: FLEXERIL TAKE ONE TABLET BY MOUTH THREE TIMES DAILY   famotidine 40 MG tablet Commonly known as: PEPCID TAKE ONE TABLET BY MOUTH TWICE DAILY   furosemide 20 MG tablet Commonly known as: LASIX TAKE ONE TABLET BY MOUTH ONCE DAILY   gabapentin 300 MG capsule Commonly known as: NEURONTIN TAKE ONE CAPSULE BY MOUTH THREE TIMES DAILY   hydrALAZINE 50 MG tablet Commonly known as: APRESOLINE TAKE ONE TABLET BY MOUTH THREE TIMES DAILY   latanoprost 0.005 % ophthalmic solution Commonly known as: XALATAN 1 drop at bedtime.   leflunomide 20 MG tablet Commonly known as: ARAVA Take 20 mg by mouth daily.   levothyroxine 175 MCG tablet Commonly known as: SYNTHROID Take 1 tablet (175 mcg total) by mouth daily.   MELATONIN GUMMIES PO Take by mouth at bedtime as needed.   multivitamin with minerals Tabs tablet Take 1 tablet by mouth daily.   prednisoLONE acetate 1 % ophthalmic suspension Commonly known as: PRED FORTE 1 drop 4 (four) times daily.   spironolactone 25 MG tablet Commonly known as: ALDACTONE TAKE ONE TABLET BY MOUTH ONCE DAILY   telmisartan 40 MG tablet Commonly known as: MICARDIS TAKE ONE TABLET BY MOUTH ONCE DAILY   triamcinolone ointment 0.5 % Commonly known as: KENALOG Apply 1 application. topically 2 (two) times daily. Use for up to 14 days  vitamin E 180 MG (400 UNITS) capsule Take 400 Units by mouth daily.   vitamin E 180 MG (400 UNITS) capsule 1 capsule        Allergies:  Allergies  Allergen Reactions   Penicillins Other (See Comments)    Intolerance or allergy not remembered; her sister stated penicillin caused "boils under the skin"   Shellfish Allergy Rash   Shellfish-Derived Products Anaphylaxis   Codeine Nausea Only    Nausea only with liquid codeine   Infliximab Other (See Comments)     Confusion (intolerance) Knots under the skin   Sulfonamide Derivatives Rash    Joint pain   Iodine    Azithromycin Itching    Past Medical  History, Surgical history, Social history, and Family History were reviewed and updated.  Review of Systems: All other 10 point review of systems is negative.   Physical Exam:  height is '5\' 4"'$  (1.626 m) and weight is 202 lb 1.9 oz (91.7 kg). Her oral temperature is 97.9 F (36.6 C). Her blood pressure is 128/50 (abnormal) and her pulse is 85. Her respiration is 18 and oxygen saturation is 98%.   Wt Readings from Last 3 Encounters:  10/16/21 202 lb 1.9 oz (91.7 kg)  08/18/21 200 lb 6.4 oz (90.9 kg)  08/18/21 200 lb 6.4 oz (90.9 kg)    Ocular: Sclerae unicteric, pupils equal, round and reactive to light Ear-nose-throat: Oropharynx clear, dentition fair Lymphatic: No cervical or supraclavicular adenopathy Lungs no rales or rhonchi, good excursion bilaterally Heart regular rate and rhythm, no murmur appreciated Abd soft, nontender, positive bowel sounds MSK no focal spinal tenderness, no joint edema Neuro: non-focal, well-oriented, appropriate affect Breasts: Deferred   Lab Results  Component Value Date   WBC 4.5 10/16/2021   HGB 10.6 (L) 10/16/2021   HCT 33.4 (L) 10/16/2021   MCV 97.4 10/16/2021   PLT 165 10/16/2021   Lab Results  Component Value Date   FERRITIN 213 07/24/2021   IRON 102 07/24/2021   TIBC 300 07/24/2021   UIBC 198 07/24/2021   IRONPCTSAT 34 (H) 07/24/2021   Lab Results  Component Value Date   RETICCTPCT 1.2 10/16/2021   RBC 3.44 (L) 10/16/2021   RBC 3.43 (L) 10/16/2021   RETICCTABS 51.6 01/03/2013   Lab Results  Component Value Date   KPAFRELGTCHN 26.5 (H) 04/24/2021   LAMBDASER 36.5 (H) 04/24/2021   KAPLAMBRATIO 0.73 04/24/2021   Lab Results  Component Value Date   IGGSERUM 989 04/24/2021   IGA 553 (H) 04/24/2021   IGMSERUM 24 (L) 04/24/2021   Lab Results  Component Value Date   TOTALPROTELP 6.9 04/24/2021   ALBUMINELP 3.9 04/24/2021   A1GS 0.2 04/24/2021   A2GS 0.7 04/24/2021   BETS 1.2 04/24/2021   BETA2SER 10.3 (H) 11/04/2011    GAMS 0.9 04/24/2021   MSPIKE 0.3 (H) 04/24/2021   SPEI Comment 04/24/2021     Chemistry      Component Value Date/Time   NA 142 09/04/2021 1254   NA 150 (H) 03/01/2017 1308   NA 143 04/05/2016 1156   K 5.1 09/04/2021 1254   K 4.0 03/01/2017 1308   K 3.8 04/05/2016 1156   CL 109 09/04/2021 1254   CL 109 (H) 03/01/2017 1308   CO2 27 09/04/2021 1254   CO2 28 03/01/2017 1308   CO2 21 (L) 04/05/2016 1156   BUN 41 (H) 09/04/2021 1254   BUN 19 03/01/2017 1308   BUN 18.5 04/05/2016 1156   CREATININE 2.27 (H)  09/04/2021 1254   CREATININE 1.8 (H) 03/01/2017 1308   CREATININE 1.5 (H) 04/05/2016 1156      Component Value Date/Time   CALCIUM 9.4 09/04/2021 1254   CALCIUM 9.7 03/01/2017 1308   CALCIUM 9.3 04/05/2016 1156   ALKPHOS 70 09/04/2021 1254   ALKPHOS 55 03/01/2017 1308   ALKPHOS 77 04/05/2016 1156   AST 14 (L) 09/04/2021 1254   AST 21 04/05/2016 1156   ALT 10 09/04/2021 1254   ALT 18 03/01/2017 1308   ALT 22 04/05/2016 1156   BILITOT 0.3 09/04/2021 1254   BILITOT 0.36 04/05/2016 1156       Impression and Plan: Ms. Roca is a very pleasant 76 yo African American female with multifactorial anemia as well as an IgA lambda MGUS.  ESA given, Hgb 10.6.  Iron studies are pending.  Lab and injection in 6 weeks, follow-up in 3 months.   [name], NP 6/2/20231:11 PM

## 2021-10-16 NOTE — Telephone Encounter (Signed)
Per 10/16/21 los - gave upcoming appointments - confirmed

## 2021-10-16 NOTE — Patient Instructions (Signed)
Darbepoetin Alfa injection ?What is this medication? ?DARBEPOETIN ALFA (dar be POE e tin  AL fa) helps your body make more red blood cells. It is used to treat anemia caused by chronic kidney failure and chemotherapy. ?This medicine may be used for other purposes; ask your health care provider or pharmacist if you have questions. ?COMMON BRAND NAME(S): Aranesp ?What should I tell my care team before I take this medication? ?They need to know if you have any of these conditions: ?blood clotting disorders or history of blood clots ?cancer patient not on chemotherapy ?cystic fibrosis ?heart disease, such as angina, heart failure, or a history of a heart attack ?hemoglobin level of 12 g/dL or greater ?high blood pressure ?low levels of folate, iron, or vitamin B12 ?seizures ?an unusual or allergic reaction to darbepoetin, erythropoietin, albumin, hamster proteins, latex, other medicines, foods, dyes, or preservatives ?pregnant or trying to get pregnant ?breast-feeding ?How should I use this medication? ?This medicine is for injection into a vein or under the skin. It is usually given by a health care professional in a hospital or clinic setting. ?If you get this medicine at home, you will be taught how to prepare and give this medicine. Use exactly as directed. Take your medicine at regular intervals. Do not take your medicine more often than directed. ?It is important that you put your used needles and syringes in a special sharps container. Do not put them in a trash can. If you do not have a sharps container, call your pharmacist or healthcare provider to get one. ?A special MedGuide will be given to you by the pharmacist with each prescription and refill. Be sure to read this information carefully each time. ?Talk to your pediatrician regarding the use of this medicine in children. While this medicine may be used in children as young as 1 month of age for selected conditions, precautions do apply. ?Overdosage: If  you think you have taken too much of this medicine contact a poison control center or emergency room at once. ?NOTE: This medicine is only for you. Do not share this medicine with others. ?What if I miss a dose? ?If you miss a dose, take it as soon as you can. If it is almost time for your next dose, take only that dose. Do not take double or extra doses. ?What may interact with this medication? ?Do not take this medicine with any of the following medications: ?epoetin alfa ?This list may not describe all possible interactions. Give your health care provider a list of all the medicines, herbs, non-prescription drugs, or dietary supplements you use. Also tell them if you smoke, drink alcohol, or use illegal drugs. Some items may interact with your medicine. ?What should I watch for while using this medication? ?Your condition will be monitored carefully while you are receiving this medicine. ?You may need blood work done while you are taking this medicine. ?This medicine may cause a decrease in vitamin B6. You should make sure that you get enough vitamin B6 while you are taking this medicine. Discuss the foods you eat and the vitamins you take with your health care professional. ?What side effects may I notice from receiving this medication? ?Side effects that you should report to your doctor or health care professional as soon as possible: ?allergic reactions like skin rash, itching or hives, swelling of the face, lips, or tongue ?breathing problems ?changes in vision ?chest pain ?confusion, trouble speaking or understanding ?feeling faint or lightheaded, falls ?high blood   pressure ?muscle aches or pains ?pain, swelling, warmth in the leg ?rapid weight gain ?severe headaches ?sudden numbness or weakness of the face, arm or leg ?trouble walking, dizziness, loss of balance or coordination ?seizures (convulsions) ?swelling of the ankles, feet, hands ?unusually weak or tired ?Side effects that usually do not require  medical attention (report to your doctor or health care professional if they continue or are bothersome): ?diarrhea ?fever, chills (flu-like symptoms) ?headaches ?nausea, vomiting ?redness, stinging, or swelling at site where injected ?This list may not describe all possible side effects. Call your doctor for medical advice about side effects. You may report side effects to FDA at 1-800-FDA-1088. ?Where should I keep my medication? ?Keep out of the reach of children. ?Store in a refrigerator between 2 and 8 degrees C (36 and 46 degrees F). Do not freeze. Do not shake. Throw away any unused portion if using a single-dose vial. Throw away any unused medicine after the expiration date. ?NOTE: This sheet is a summary. It may not cover all possible information. If you have questions about this medicine, talk to your doctor, pharmacist, or health care provider. ?? 2022 Elsevier/Gold Standard (2017-05-23 00:00:00) ? ?

## 2021-10-19 ENCOUNTER — Other Ambulatory Visit: Payer: Self-pay | Admitting: Internal Medicine

## 2021-10-19 ENCOUNTER — Ambulatory Visit
Admission: RE | Admit: 2021-10-19 | Discharge: 2021-10-19 | Disposition: A | Discharge status: Home / Self Care | Payer: Medicare Other | Source: Ambulatory Visit | Attending: Internal Medicine | Admitting: Internal Medicine

## 2021-10-19 DIAGNOSIS — R921 Mammographic calcification found on diagnostic imaging of breast: Secondary | ICD-10-CM

## 2021-10-19 DIAGNOSIS — R928 Other abnormal and inconclusive findings on diagnostic imaging of breast: Secondary | ICD-10-CM

## 2021-10-20 ENCOUNTER — Telehealth: Payer: Self-pay | Admitting: Internal Medicine

## 2021-10-20 DIAGNOSIS — R21 Rash and other nonspecific skin eruption: Secondary | ICD-10-CM

## 2021-10-20 NOTE — Telephone Encounter (Signed)
Patient would like a referral to Dr. Rolm Bookbinder - dermatologist - she has discussed this referral previously

## 2021-10-21 NOTE — Telephone Encounter (Signed)
Referral was ordered for [facility] dermatology.  Dr. Ubaldo Glassing has retired so is no longer there, but it is a good practice so would recommend seeing someone else in the practice.  I put the referral in for a rash I think that is what the referral is for but not 100% sure.

## 2021-10-22 NOTE — Telephone Encounter (Signed)
Message left for patient today with info 

## 2021-10-29 DIAGNOSIS — Z6834 Body mass index (BMI) 34.0-34.9, adult: Secondary | ICD-10-CM | POA: Diagnosis not present

## 2021-10-29 DIAGNOSIS — M48062 Spinal stenosis, lumbar region with neurogenic claudication: Secondary | ICD-10-CM | POA: Diagnosis not present

## 2021-11-09 ENCOUNTER — Telehealth: Payer: Self-pay | Admitting: Internal Medicine

## 2021-11-09 DIAGNOSIS — R21 Rash and other nonspecific skin eruption: Secondary | ICD-10-CM

## 2021-11-09 NOTE — Telephone Encounter (Signed)
Referral ordered

## 2021-11-09 NOTE — Telephone Encounter (Signed)
Pt.stated she is ready for referral for dermatologist  as dr.requested to her

## 2021-11-22 ENCOUNTER — Other Ambulatory Visit: Payer: Self-pay | Admitting: Internal Medicine

## 2021-12-01 DIAGNOSIS — H40053 Ocular hypertension, bilateral: Secondary | ICD-10-CM | POA: Diagnosis not present

## 2022-01-01 ENCOUNTER — Other Ambulatory Visit: Payer: Self-pay | Admitting: Internal Medicine

## 2022-01-05 DIAGNOSIS — M0589 Other rheumatoid arthritis with rheumatoid factor of multiple sites: Secondary | ICD-10-CM | POA: Diagnosis not present

## 2022-01-05 DIAGNOSIS — Z79899 Other long term (current) drug therapy: Secondary | ICD-10-CM | POA: Diagnosis not present

## 2022-01-15 ENCOUNTER — Telehealth: Payer: Self-pay | Admitting: *Deleted

## 2022-01-15 ENCOUNTER — Encounter: Payer: Self-pay | Admitting: Family

## 2022-01-15 ENCOUNTER — Inpatient Hospital Stay: Payer: Medicare Other | Attending: Hematology & Oncology

## 2022-01-15 ENCOUNTER — Inpatient Hospital Stay (HOSPITAL_BASED_OUTPATIENT_CLINIC_OR_DEPARTMENT_OTHER): Payer: Medicare Other | Admitting: Family

## 2022-01-15 ENCOUNTER — Inpatient Hospital Stay: Payer: Medicare Other

## 2022-01-15 VITALS — BP 104/48 | HR 89 | Temp 98.5°F | Resp 20 | Ht 64.0 in | Wt 207.0 lb

## 2022-01-15 DIAGNOSIS — D509 Iron deficiency anemia, unspecified: Secondary | ICD-10-CM | POA: Insufficient documentation

## 2022-01-15 DIAGNOSIS — R200 Anesthesia of skin: Secondary | ICD-10-CM | POA: Diagnosis not present

## 2022-01-15 DIAGNOSIS — D472 Monoclonal gammopathy: Secondary | ICD-10-CM | POA: Insufficient documentation

## 2022-01-15 DIAGNOSIS — R202 Paresthesia of skin: Secondary | ICD-10-CM | POA: Insufficient documentation

## 2022-01-15 DIAGNOSIS — R5383 Other fatigue: Secondary | ICD-10-CM | POA: Insufficient documentation

## 2022-01-15 DIAGNOSIS — N182 Chronic kidney disease, stage 2 (mild): Secondary | ICD-10-CM | POA: Insufficient documentation

## 2022-01-15 DIAGNOSIS — M25472 Effusion, left ankle: Secondary | ICD-10-CM | POA: Insufficient documentation

## 2022-01-15 DIAGNOSIS — D508 Other iron deficiency anemias: Secondary | ICD-10-CM

## 2022-01-15 DIAGNOSIS — M25471 Effusion, right ankle: Secondary | ICD-10-CM | POA: Diagnosis not present

## 2022-01-15 DIAGNOSIS — R21 Rash and other nonspecific skin eruption: Secondary | ICD-10-CM | POA: Diagnosis not present

## 2022-01-15 DIAGNOSIS — D631 Anemia in chronic kidney disease: Secondary | ICD-10-CM | POA: Diagnosis not present

## 2022-01-15 DIAGNOSIS — R0602 Shortness of breath: Secondary | ICD-10-CM | POA: Insufficient documentation

## 2022-01-15 LAB — CMP (CANCER CENTER ONLY)
ALT: 9 U/L (ref 0–44)
AST: 13 U/L — ABNORMAL LOW (ref 15–41)
Albumin: 4.3 g/dL (ref 3.5–5.0)
Alkaline Phosphatase: 63 U/L (ref 38–126)
Anion gap: 8 (ref 5–15)
BUN: 54 mg/dL — ABNORMAL HIGH (ref 8–23)
CO2: 26 mmol/L (ref 22–32)
Calcium: 9.3 mg/dL (ref 8.9–10.3)
Chloride: 108 mmol/L (ref 98–111)
Creatinine: 3.17 mg/dL (ref 0.44–1.00)
GFR, Estimated: 15 mL/min — ABNORMAL LOW (ref >60)
Glucose, Bld: 109 mg/dL — ABNORMAL HIGH (ref 70–99)
Potassium: 4.8 mmol/L (ref 3.5–5.1)
Sodium: 142 mmol/L (ref 135–145)
Total Bilirubin: 0.4 mg/dL (ref 0.3–1.2)
Total Protein: 7.3 g/dL (ref 6.5–8.1)

## 2022-01-15 LAB — DIFFERENTIAL
Abs Immature Granulocytes: 0.01 10*3/uL (ref 0.00–0.07)
Basophils Absolute: 0 10*3/uL (ref 0.0–0.1)
Basophils Relative: 1 %
Eosinophils Absolute: 0.1 10*3/uL (ref 0.0–0.5)
Eosinophils Relative: 3 %
Immature Granulocytes: 0 %
Lymphocytes Relative: 51 %
Lymphs Abs: 2.4 10*3/uL (ref 0.7–4.0)
Monocytes Absolute: 0.7 10*3/uL (ref 0.1–1.0)
Monocytes Relative: 15 %
Neutro Abs: 1.4 10*3/uL — ABNORMAL LOW (ref 1.7–7.7)
Neutrophils Relative %: 30 %

## 2022-01-15 LAB — CBC
HCT: 27.7 % — ABNORMAL LOW (ref 36.0–46.0)
Hemoglobin: 8.8 g/dL — ABNORMAL LOW (ref 12.0–15.0)
MCH: 31.9 pg (ref 26.0–34.0)
MCHC: 31.8 g/dL (ref 30.0–36.0)
MCV: 100.4 fL — ABNORMAL HIGH (ref 80.0–100.0)
Platelets: 174 10*3/uL (ref 150–400)
RBC: 2.76 MIL/uL — ABNORMAL LOW (ref 3.87–5.11)
RDW: 14.1 % (ref 11.5–15.5)
WBC: 4.7 10*3/uL (ref 4.0–10.5)
nRBC: 0 % (ref 0.0–0.2)

## 2022-01-15 LAB — IRON AND IRON BINDING CAPACITY (CC-WL,HP ONLY)
Iron: 80 ug/dL (ref 28–170)
Saturation Ratios: 26 % (ref 10.4–31.8)
TIBC: 311 ug/dL (ref 250–450)
UIBC: 231 ug/dL (ref 148–442)

## 2022-01-15 LAB — FERRITIN: Ferritin: 178 ng/mL (ref 11–307)

## 2022-01-15 MED ORDER — DARBEPOETIN ALFA 300 MCG/0.6ML IJ SOSY
300.0000 ug | PREFILLED_SYRINGE | Freq: Once | INTRAMUSCULAR | Status: AC
  Administered 2022-01-15: 300 ug via SUBCUTANEOUS
  Filled 2022-01-15: qty 0.6

## 2022-01-15 NOTE — Patient Instructions (Signed)

## 2022-01-15 NOTE — Progress Notes (Signed)
Hematology and Oncology Follow Up Visit  Tina Molina [id number] 10-Mar-1946 76 y.o. 01/15/2022   Principle Diagnosis:  Anemia of chronic renal failure - stage II MGUS - IgA lambda Iron deficiency anemia   Current Therapy:        Aranesp 300 mcg SQ to keep Hgb > 11 IV iron as indicated   Interim History:  Tina Molina is here today for follow-up. She is feeling fatigued and mildly SOB with exertion. She takes a break to rest when needed.  Hgb today is down at 8.8, MCV 100, platelets 174 and WBC count 4.7.  No obvious blood loss. No bruising or petechia.  Her BUN is 54 and Creatinine 3.17. She notes occasional swelling in her ankles. No swelling, redness or pitting edema noted on exam today.  Labs sent to her nephrologist Dr. Hollie Salk.  Lung sounds are clear.    No fever, chills, n/v, cough, dizziness, chest pain, palpitations, abdominal pain or changes in bowel or bladder habits.  She has a rash on the palms of both her hands for a couple months now and states that she has been referred to dermatology for further evaluation and treatment by her PCP.  Positional numbness and tingling in the fingertips and feet.  Pedal pulses are 2+.  No falls or syncope.  Appetite comes and goes. She is doing her best to stay well hydrated. Her weight is stable at 207 lbs.   ECOG Performance Status: 1 - Symptomatic but completely ambulatory  Medications:  Allergies as of 01/15/2022       Reactions   Penicillins Other (See Comments)   Intolerance or allergy not remembered; her sister stated penicillin caused "boils under the skin"   Shellfish Allergy Rash   Shellfish-derived Products Anaphylaxis   Codeine Nausea Only   Nausea only with liquid codeine   Infliximab Other (See Comments)    Confusion (intolerance) Knots under the skin   Sulfonamide Derivatives Rash   Joint pain   Iodine    Azithromycin Itching        Medication List        Accurate as of January 15, 2022  1:42 PM. If you  have any questions, ask your nurse or doctor.          STOP taking these medications    prednisoLONE acetate 1 % ophthalmic suspension Commonly known as: PRED FORTE Stopped by: [name], NP       TAKE these medications    aspirin 81 MG tablet Take 81 mg by mouth daily.   atorvastatin 20 MG tablet Commonly known as: LIPITOR TAKE ONE TABLET BY MOUTH ONCE DAILY   budesonide-formoterol 160-4.5 MCG/ACT inhaler Commonly known as: Symbicort inhale 2 puffs if needed for wheezing   carvedilol 25 MG tablet Commonly known as: COREG TAKE ONE TABLET BY MOUTH TWICE DAILY   cyanocobalamin 100 MCG tablet Take 100 mcg by mouth daily.   cyclobenzaprine 10 MG tablet Commonly known as: FLEXERIL TAKE ONE TABLET BY MOUTH THREE TIMES DAILY   famotidine 40 MG tablet Commonly known as: PEPCID TAKE ONE TABLET BY MOUTH TWICE DAILY   furosemide 20 MG tablet Commonly known as: LASIX TAKE ONE TABLET BY MOUTH ONCE DAILY   gabapentin 300 MG capsule Commonly known as: NEURONTIN TAKE ONE CAPSULE BY MOUTH THREE TIMES DAILY   hydrALAZINE 50 MG tablet Commonly known as: APRESOLINE TAKE ONE TABLET BY MOUTH THREE TIMES DAILY   latanoprost 0.005 % ophthalmic solution Commonly known as: XALATAN 1  drop at bedtime.   leflunomide 20 MG tablet Commonly known as: ARAVA Take 20 mg by mouth daily.   levothyroxine 175 MCG tablet Commonly known as: SYNTHROID Take 1 tablet (175 mcg total) by mouth daily.   MELATONIN GUMMIES PO Take by mouth at bedtime as needed.   multivitamin with minerals Tabs tablet Take 1 tablet by mouth daily.   spironolactone 25 MG tablet Commonly known as: ALDACTONE TAKE ONE TABLET BY MOUTH ONCE DAILY   telmisartan 40 MG tablet Commonly known as: MICARDIS TAKE ONE TABLET BY MOUTH ONCE DAILY   triamcinolone ointment 0.5 % Commonly known as: KENALOG APPLY TO THE AFFECTED AREA TWICE DAILY USE FOR UPTO 14 DAYS   vitamin E 180 MG (400 UNITS) capsule Take 400  Units by mouth daily.        Allergies:  Allergies  Allergen Reactions   Penicillins Other (See Comments)    Intolerance or allergy not remembered; her sister stated penicillin caused "boils under the skin"   Shellfish Allergy Rash   Shellfish-Derived Products Anaphylaxis   Codeine Nausea Only    Nausea only with liquid codeine   Infliximab Other (See Comments)     Confusion (intolerance) Knots under the skin   Sulfonamide Derivatives Rash    Joint pain   Iodine    Azithromycin Itching    Past Medical History, Surgical history, Social history, and Family History were reviewed and updated.  Review of Systems: All other 10 point review of systems is negative.   Physical Exam:  height is '5\' 4"'$  (1.626 m) and weight is 207 lb (93.9 kg). Her oral temperature is 98.5 F (36.9 C). Her blood pressure is 104/48 (abnormal) and her pulse is 89. Her respiration is 20 and oxygen saturation is 97%.   Wt Readings from Last 3 Encounters:  01/15/22 207 lb (93.9 kg)  10/16/21 202 lb 1.9 oz (91.7 kg)  08/18/21 200 lb 6.4 oz (90.9 kg)    Ocular: Sclerae unicteric, pupils equal, round and reactive to light Ear-nose-throat: Oropharynx clear, dentition fair Lymphatic: No cervical or supraclavicular adenopathy Lungs no rales or rhonchi, good excursion bilaterally Heart regular rate and rhythm, no murmur appreciated Abd soft, nontender, positive bowel sounds MSK no focal spinal tenderness, no joint edema Neuro: non-focal, well-oriented, appropriate affect Breasts: Deferred   Lab Results  Component Value Date   WBC 4.5 10/16/2021   HGB 10.6 (L) 10/16/2021   HCT 33.4 (L) 10/16/2021   MCV 97.4 10/16/2021   PLT 165 10/16/2021   Lab Results  Component Value Date   FERRITIN 138 10/16/2021   IRON 98 10/16/2021   TIBC 330 10/16/2021   UIBC 232 10/16/2021   IRONPCTSAT 30 10/16/2021   Lab Results  Component Value Date   RETICCTPCT 1.2 10/16/2021   RBC 3.44 (L) 10/16/2021   RBC 3.43  (L) 10/16/2021   RETICCTABS 51.6 01/03/2013   Lab Results  Component Value Date   KPAFRELGTCHN 26.5 (H) 04/24/2021   LAMBDASER 36.5 (H) 04/24/2021   KAPLAMBRATIO 0.73 04/24/2021   Lab Results  Component Value Date   IGGSERUM 989 04/24/2021   IGA 553 (H) 04/24/2021   IGMSERUM 24 (L) 04/24/2021   Lab Results  Component Value Date   TOTALPROTELP 6.9 04/24/2021   ALBUMINELP 3.9 04/24/2021   A1GS 0.2 04/24/2021   A2GS 0.7 04/24/2021   BETS 1.2 04/24/2021   BETA2SER 10.3 (H) 11/04/2011   GAMS 0.9 04/24/2021   MSPIKE 0.3 (H) 04/24/2021   SPEI Comment 04/24/2021  Chemistry      Component Value Date/Time   NA 141 10/16/2021 1250   NA 150 (H) 03/01/2017 1308   NA 143 04/05/2016 1156   K 4.2 10/16/2021 1250   K 4.0 03/01/2017 1308   K 3.8 04/05/2016 1156   CL 109 10/16/2021 1250   CL 109 (H) 03/01/2017 1308   CO2 24 10/16/2021 1250   CO2 28 03/01/2017 1308   CO2 21 (L) 04/05/2016 1156   BUN 38 (H) 10/16/2021 1250   BUN 19 03/01/2017 1308   BUN 18.5 04/05/2016 1156   CREATININE 1.80 (H) 10/16/2021 1250   CREATININE 1.8 (H) 03/01/2017 1308   CREATININE 1.5 (H) 04/05/2016 1156      Component Value Date/Time   CALCIUM 9.3 10/16/2021 1250   CALCIUM 9.7 03/01/2017 1308   CALCIUM 9.3 04/05/2016 1156   ALKPHOS 72 10/16/2021 1250   ALKPHOS 55 03/01/2017 1308   ALKPHOS 77 04/05/2016 1156   AST 20 10/16/2021 1250   AST 21 04/05/2016 1156   ALT 19 10/16/2021 1250   ALT 18 03/01/2017 1308   ALT 22 04/05/2016 1156   BILITOT 0.3 10/16/2021 1250   BILITOT 0.36 04/05/2016 1156       Impression and Plan: Ms. Hallmon is a very pleasant 75 yo African American female with multifactorial anemia as well as an IgA lambda MGUS.  ESA given, Hgb 8.8.  Iron studies are pending.  Follow-up, lab and injection in 3 weeks.   [name], NP 9/1/20231:42 PM

## 2022-01-15 NOTE — Telephone Encounter (Signed)
Per 01/15/22 los gave upcoming appointments - confirmed

## 2022-01-15 NOTE — Telephone Encounter (Signed)
Vale Haven NP notified of creat-3.17.  No new orders received at this time.

## 2022-02-08 ENCOUNTER — Inpatient Hospital Stay (HOSPITAL_BASED_OUTPATIENT_CLINIC_OR_DEPARTMENT_OTHER): Payer: Medicare Other | Admitting: Family

## 2022-02-08 ENCOUNTER — Inpatient Hospital Stay: Payer: Medicare Other

## 2022-02-08 ENCOUNTER — Encounter: Payer: Self-pay | Admitting: Family

## 2022-02-08 ENCOUNTER — Other Ambulatory Visit: Payer: Self-pay

## 2022-02-08 VITALS — BP 119/52 | HR 79 | Temp 98.6°F | Resp 18 | Ht 64.0 in | Wt 204.8 lb

## 2022-02-08 DIAGNOSIS — D472 Monoclonal gammopathy: Secondary | ICD-10-CM | POA: Diagnosis not present

## 2022-02-08 DIAGNOSIS — R200 Anesthesia of skin: Secondary | ICD-10-CM | POA: Diagnosis not present

## 2022-02-08 DIAGNOSIS — D508 Other iron deficiency anemias: Secondary | ICD-10-CM

## 2022-02-08 DIAGNOSIS — D631 Anemia in chronic kidney disease: Secondary | ICD-10-CM

## 2022-02-08 DIAGNOSIS — N182 Chronic kidney disease, stage 2 (mild): Secondary | ICD-10-CM | POA: Diagnosis not present

## 2022-02-08 DIAGNOSIS — D509 Iron deficiency anemia, unspecified: Secondary | ICD-10-CM | POA: Diagnosis not present

## 2022-02-08 DIAGNOSIS — R21 Rash and other nonspecific skin eruption: Secondary | ICD-10-CM | POA: Diagnosis not present

## 2022-02-08 LAB — CBC WITH DIFFERENTIAL (CANCER CENTER ONLY)
Abs Immature Granulocytes: 0.01 10*3/uL (ref 0.00–0.07)
Basophils Absolute: 0 10*3/uL (ref 0.0–0.1)
Basophils Relative: 1 %
Eosinophils Absolute: 0.1 10*3/uL (ref 0.0–0.5)
Eosinophils Relative: 3 %
HCT: 34.5 % — ABNORMAL LOW (ref 36.0–46.0)
Hemoglobin: 10.8 g/dL — ABNORMAL LOW (ref 12.0–15.0)
Immature Granulocytes: 0 %
Lymphocytes Relative: 43 %
Lymphs Abs: 1.6 10*3/uL (ref 0.7–4.0)
MCH: 32.6 pg (ref 26.0–34.0)
MCHC: 31.3 g/dL (ref 30.0–36.0)
MCV: 104.2 fL — ABNORMAL HIGH (ref 80.0–100.0)
Monocytes Absolute: 0.5 10*3/uL (ref 0.1–1.0)
Monocytes Relative: 14 %
Neutro Abs: 1.4 10*3/uL — ABNORMAL LOW (ref 1.7–7.7)
Neutrophils Relative %: 39 %
Platelet Count: 206 10*3/uL (ref 150–400)
RBC: 3.31 MIL/uL — ABNORMAL LOW (ref 3.87–5.11)
RDW: 14.6 % (ref 11.5–15.5)
WBC Count: 3.7 10*3/uL — ABNORMAL LOW (ref 4.0–10.5)
nRBC: 0 % (ref 0.0–0.2)

## 2022-02-08 LAB — CMP (CANCER CENTER ONLY)
ALT: 8 U/L (ref 0–44)
AST: 13 U/L — ABNORMAL LOW (ref 15–41)
Albumin: 4.3 g/dL (ref 3.5–5.0)
Alkaline Phosphatase: 70 U/L (ref 38–126)
Anion gap: 8 (ref 5–15)
BUN: 46 mg/dL — ABNORMAL HIGH (ref 8–23)
CO2: 27 mmol/L (ref 22–32)
Calcium: 9.6 mg/dL (ref 8.9–10.3)
Chloride: 108 mmol/L (ref 98–111)
Creatinine: 2.48 mg/dL — ABNORMAL HIGH (ref 0.44–1.00)
GFR, Estimated: 20 mL/min — ABNORMAL LOW (ref >60)
Glucose, Bld: 111 mg/dL — ABNORMAL HIGH (ref 70–99)
Potassium: 5 mmol/L (ref 3.5–5.1)
Sodium: 143 mmol/L (ref 135–145)
Total Bilirubin: 0.5 mg/dL (ref 0.3–1.2)
Total Protein: 7.4 g/dL (ref 6.5–8.1)

## 2022-02-08 LAB — FERRITIN: Ferritin: 125 ng/mL (ref 11–307)

## 2022-02-08 MED ORDER — DARBEPOETIN ALFA 300 MCG/0.6ML IJ SOSY
300.0000 ug | PREFILLED_SYRINGE | Freq: Once | INTRAMUSCULAR | Status: AC
  Administered 2022-02-08: 300 ug via SUBCUTANEOUS
  Filled 2022-02-08: qty 0.6

## 2022-02-08 NOTE — Progress Notes (Signed)
Hematology and Oncology Follow Up Visit  Tina Molina [id number] Aug 18, 1945 76 y.o. 02/08/2022   Principle Diagnosis:  Anemia of chronic renal failure - stage II MGUS - IgA lambda Iron deficiency anemia   Current Therapy:        Aranesp 300 mcg SQ to keep Hgb > 11 IV iron as indicated   Interim History:  Tina Molina is here today for follow-up. She is doing fairly well but does not persistent fatigue as well as some mild SOB with over exertion. She takes a break to rest when needed.  Hgb is much improved at 10.8.  She did note one episode of a small amount of blood in her stool last week. No other blood loss noted. No bruising or petechiae.  No fever, chills, n/v, cough, rash, dizziness, chest pain, palpitations, abdominal pain or changes in bowel or bladder habits.  No swelling in her extremities.  Neuropathy comes and goes in her hands and is unchanged in her both feet.  No falls or syncope reported.  Appetite and hydration have been good. Her weight is stable at 204 lbs.   ECOG Performance Status: 1 - Symptomatic but completely ambulatory  Medications:  Allergies as of 02/08/2022       Reactions   Penicillins Other (See Comments)   Intolerance or allergy not remembered; her sister stated penicillin caused "boils under the skin"   Shellfish Allergy Rash   Shellfish-derived Products Anaphylaxis   Codeine Nausea Only   Nausea only with liquid codeine   Infliximab Other (See Comments)    Confusion (intolerance) Knots under the skin   Sulfonamide Derivatives Rash   Joint pain   Azithromycin Itching   Iodine Other (See Comments)   UNKNOWN REACTION        Medication List        Accurate as of February 08, 2022  2:04 PM. If you have any questions, ask your nurse or doctor.          aspirin 81 MG tablet Take 81 mg by mouth daily.   atorvastatin 20 MG tablet Commonly known as: LIPITOR TAKE ONE TABLET BY MOUTH ONCE DAILY   budesonide-formoterol 160-4.5  MCG/ACT inhaler Commonly known as: Symbicort inhale 2 puffs if needed for wheezing   carvedilol 25 MG tablet Commonly known as: COREG TAKE ONE TABLET BY MOUTH TWICE DAILY   cyanocobalamin 100 MCG tablet Take 100 mcg by mouth daily.   cyclobenzaprine 10 MG tablet Commonly known as: FLEXERIL TAKE ONE TABLET BY MOUTH THREE TIMES DAILY   famotidine 40 MG tablet Commonly known as: PEPCID TAKE ONE TABLET BY MOUTH TWICE DAILY   furosemide 20 MG tablet Commonly known as: LASIX TAKE ONE TABLET BY MOUTH ONCE DAILY   gabapentin 300 MG capsule Commonly known as: NEURONTIN TAKE ONE CAPSULE BY MOUTH THREE TIMES DAILY   hydrALAZINE 50 MG tablet Commonly known as: APRESOLINE TAKE ONE TABLET BY MOUTH THREE TIMES DAILY   latanoprost 0.005 % ophthalmic solution Commonly known as: XALATAN 1 drop at bedtime.   leflunomide 20 MG tablet Commonly known as: ARAVA Take 20 mg by mouth daily.   levothyroxine 175 MCG tablet Commonly known as: SYNTHROID Take 1 tablet (175 mcg total) by mouth daily.   MELATONIN GUMMIES PO Take by mouth at bedtime as needed.   multivitamin with minerals Tabs tablet Take 1 tablet by mouth daily.   spironolactone 25 MG tablet Commonly known as: ALDACTONE TAKE ONE TABLET BY MOUTH ONCE DAILY   telmisartan  40 MG tablet Commonly known as: MICARDIS TAKE ONE TABLET BY MOUTH ONCE DAILY   triamcinolone ointment 0.5 % Commonly known as: KENALOG APPLY TO THE AFFECTED AREA TWICE DAILY USE FOR UPTO 14 DAYS   vitamin E 180 MG (400 UNITS) capsule Take 400 Units by mouth daily.        Allergies:  Allergies  Allergen Reactions   Penicillins Other (See Comments)    Intolerance or allergy not remembered; her sister stated penicillin caused "boils under the skin"   Shellfish Allergy Rash   Shellfish-Derived Products Anaphylaxis   Codeine Nausea Only    Nausea only with liquid codeine   Infliximab Other (See Comments)     Confusion (intolerance) Knots under  the skin   Sulfonamide Derivatives Rash    Joint pain   Azithromycin Itching   Iodine Other (See Comments)    UNKNOWN REACTION    Past Medical History, Surgical history, Social history, and Family History were reviewed and updated.  Review of Systems: All other 10 point review of systems is negative.   Physical Exam:  height is '5\' 4"'$  (1.626 m) and weight is 204 lb 12.8 oz (92.9 kg). Her oral temperature is 98.6 F (37 C). Her blood pressure is 119/52 (abnormal) and her pulse is 79. Her respiration is 18 and oxygen saturation is 100%.   Wt Readings from Last 3 Encounters:  02/08/22 204 lb 12.8 oz (92.9 kg)  01/15/22 207 lb (93.9 kg)  10/16/21 202 lb 1.9 oz (91.7 kg)    Ocular: Sclerae unicteric, pupils equal, round and reactive to light Ear-nose-throat: Oropharynx clear, dentition fair Lymphatic: No cervical or supraclavicular adenopathy Lungs no rales or rhonchi, good excursion bilaterally Heart regular rate and rhythm, no murmur appreciated Abd soft, nontender, positive bowel sounds MSK no focal spinal tenderness, no joint edema Neuro: non-focal, well-oriented, appropriate affect Breasts: Deferred   Lab Results  Component Value Date   WBC 3.7 (L) 02/08/2022   HGB 10.8 (L) 02/08/2022   HCT 34.5 (L) 02/08/2022   MCV 104.2 (H) 02/08/2022   PLT 206 02/08/2022   Lab Results  Component Value Date   FERRITIN 178 01/15/2022   IRON 80 01/15/2022   TIBC 311 01/15/2022   UIBC 231 01/15/2022   IRONPCTSAT 26 01/15/2022   Lab Results  Component Value Date   RETICCTPCT 1.2 10/16/2021   RBC 3.31 (L) 02/08/2022   RETICCTABS 51.6 01/03/2013   Lab Results  Component Value Date   KPAFRELGTCHN 26.5 (H) 04/24/2021   LAMBDASER 36.5 (H) 04/24/2021   KAPLAMBRATIO 0.73 04/24/2021   Lab Results  Component Value Date   IGGSERUM 989 04/24/2021   IGA 553 (H) 04/24/2021   IGMSERUM 24 (L) 04/24/2021   Lab Results  Component Value Date   TOTALPROTELP 6.9 04/24/2021   ALBUMINELP  3.9 04/24/2021   A1GS 0.2 04/24/2021   A2GS 0.7 04/24/2021   BETS 1.2 04/24/2021   BETA2SER 10.3 (H) 11/04/2011   GAMS 0.9 04/24/2021   MSPIKE 0.3 (H) 04/24/2021   SPEI Comment 04/24/2021     Chemistry      Component Value Date/Time   NA 143 02/08/2022 1305   NA 150 (H) 03/01/2017 1308   NA 143 04/05/2016 1156   K 5.0 02/08/2022 1305   K 4.0 03/01/2017 1308   K 3.8 04/05/2016 1156   CL 108 02/08/2022 1305   CL 109 (H) 03/01/2017 1308   CO2 27 02/08/2022 1305   CO2 28 03/01/2017 1308   CO2 21 (  L) 04/05/2016 1156   BUN 46 (H) 02/08/2022 1305   BUN 19 03/01/2017 1308   BUN 18.5 04/05/2016 1156   CREATININE 2.48 (H) 02/08/2022 1305   CREATININE 1.8 (H) 03/01/2017 1308   CREATININE 1.5 (H) 04/05/2016 1156      Component Value Date/Time   CALCIUM 9.6 02/08/2022 1305   CALCIUM 9.7 03/01/2017 1308   CALCIUM 9.3 04/05/2016 1156   ALKPHOS 70 02/08/2022 1305   ALKPHOS 55 03/01/2017 1308   ALKPHOS 77 04/05/2016 1156   AST 13 (L) 02/08/2022 1305   AST 21 04/05/2016 1156   ALT 8 02/08/2022 1305   ALT 18 03/01/2017 1308   ALT 22 04/05/2016 1156   BILITOT 0.5 02/08/2022 1305   BILITOT 0.36 04/05/2016 1156       Impression and Plan: Ms. Gupta is a very pleasant 76 yo African American female with multifactorial anemia as well as an IgA lambda MGUS.  ESA given, Hgb 10.8.  Iron studies pending.  Follow-up in 4 weeks.   [name], NP 9/25/20232:04 PM

## 2022-02-09 LAB — IRON AND IRON BINDING CAPACITY (CC-WL,HP ONLY)
Iron: 104 ug/dL (ref 28–170)
Saturation Ratios: 32 % — ABNORMAL HIGH (ref 10.4–31.8)
TIBC: 328 ug/dL (ref 250–450)
UIBC: 224 ug/dL (ref 148–442)

## 2022-02-12 DIAGNOSIS — Z23 Encounter for immunization: Secondary | ICD-10-CM | POA: Diagnosis not present

## 2022-02-19 ENCOUNTER — Other Ambulatory Visit: Payer: Self-pay | Admitting: Internal Medicine

## 2022-02-19 DIAGNOSIS — I10 Essential (primary) hypertension: Secondary | ICD-10-CM

## 2022-02-22 NOTE — Progress Notes (Unsigned)
Subjective:    Patient ID: Tina Molina, female    DOB: 05/30/1945, 76 y.o.   MRN: [id number]     HPI Tina Molina is here for follow up of her chronic medical problems, including htn, hypothyroid, hld, hyperglycemia, gerd, ckd  B/l palmar rash - no itching, pain. Alcohol helps.  Never heard from dermatology.  Still tired, gaining weight  No exercise   Medications and allergies reviewed with patient and updated if appropriate.  Current Outpatient Medications on File Prior to Visit  Medication Sig Dispense Refill   aspirin 81 MG tablet Take 81 mg by mouth daily.     atorvastatin (LIPITOR) 20 MG tablet TAKE ONE TABLET BY MOUTH ONCE DAILY 90 tablet 1   budesonide-formoterol (SYMBICORT) 160-4.5 MCG/ACT inhaler inhale 2 puffs if needed for wheezing 10.2 g 0   carvedilol (COREG) 25 MG tablet TAKE ONE TABLET BY MOUTH TWICE DAILY 180 tablet 1   cyanocobalamin 100 MCG tablet Take 100 mcg by mouth daily.     cyclobenzaprine (FLEXERIL) 10 MG tablet TAKE ONE TABLET BY MOUTH THREE TIMES DAILY 270 tablet 1   famotidine (PEPCID) 40 MG tablet TAKE ONE TABLET BY MOUTH TWICE DAILY 180 tablet 1   furosemide (LASIX) 20 MG tablet TAKE ONE TABLET BY MOUTH ONCE DAILY 90 tablet 1   gabapentin (NEURONTIN) 300 MG capsule TAKE ONE CAPSULE BY MOUTH THREE TIMES DAILY 270 capsule 1   hydrALAZINE (APRESOLINE) 50 MG tablet TAKE ONE TABLET BY MOUTH THREE TIMES DAILY 270 tablet 1   latanoprost (XALATAN) 0.005 % ophthalmic solution 1 drop at bedtime.     leflunomide (ARAVA) 20 MG tablet Take 20 mg by mouth daily.     levothyroxine (SYNTHROID) 175 MCG tablet Take 1 tablet (175 mcg total) by mouth daily. 90 tablet 3   MELATONIN GUMMIES PO Take by mouth at bedtime as needed.     Multiple Vitamin (MULTIVITAMIN WITH MINERALS) TABS Take 1 tablet by mouth daily.     spironolactone (ALDACTONE) 25 MG tablet TAKE ONE TABLET BY MOUTH ONCE DAILY 90 tablet 1   telmisartan (MICARDIS) 40 MG tablet TAKE ONE TABLET BY MOUTH  ONCE DAILY 90 tablet 1   triamcinolone ointment (KENALOG) 0.5 % APPLY TO THE AFFECTED AREA TWICE DAILY USE FOR UPTO 14 DAYS 15 g 0   vitamin E 400 UNIT capsule Take 400 Units by mouth daily.      [DISCONTINUED] modafinil (PROVIGIL) 100 MG tablet Take 100 mg by mouth daily.     [DISCONTINUED] potassium chloride (MICRO-K) 10 MEQ CR capsule Take 1 capsule (10 mEq total) by mouth 2 (two) times daily. 180 capsule 1   No current facility-administered medications on file prior to visit.     Review of Systems  Constitutional:  Positive for fatigue. Negative for fever.  Respiratory:  Positive for shortness of breath (with moderate exertion). Negative for cough and wheezing.   Cardiovascular:  Positive for palpitations (occ) and leg swelling. Negative for chest pain.  Musculoskeletal:  Positive for arthralgias.  Skin:  Positive for rash.  Neurological:  Negative for light-headedness and headaches.       Objective:   Vitals:   02/23/22 1429  BP: 118/60  Pulse: 79  Temp: 98.5 F (36.9 C)  SpO2: 94%   BP Readings from Last 3 Encounters:  02/23/22 118/60  02/08/22 (!) 119/52  01/15/22 (!) 104/48   Wt Readings from Last 3 Encounters:  02/23/22 205 lb 9.6 oz (93.3 kg)  02/08/22  204 lb 12.8 oz (92.9 kg)  01/15/22 207 lb (93.9 kg)   Body mass index is 35.29 kg/m.    Physical Exam Constitutional:      General: She is not in acute distress.    Appearance: Normal appearance.  HENT:     Head: Normocephalic and atraumatic.  Eyes:     Conjunctiva/sclera: Conjunctivae normal.  Cardiovascular:     Rate and Rhythm: Normal rate and regular rhythm.     Heart sounds: Normal heart sounds. No murmur heard. Pulmonary:     Effort: Pulmonary effort is normal. No respiratory distress.     Breath sounds: Normal breath sounds. No wheezing.  Musculoskeletal:     Cervical back: Neck supple.     Right lower leg: No edema.     Left lower leg: No edema.  Lymphadenopathy:     Cervical: No cervical  adenopathy.  Skin:    General: Skin is warm and dry.     Findings: Rash (Bilateral hands-papular rash-on palms and posterior hands-couple areas are [place] where the skin is peeling) present.  Neurological:     Mental Status: She is alert. Mental status is at baseline.  Psychiatric:        Mood and Affect: Mood normal.        Behavior: Behavior normal.        Lab Results  Component Value Date   WBC 3.7 (L) 02/08/2022   HGB 10.8 (L) 02/08/2022   HCT 34.5 (L) 02/08/2022   PLT 206 02/08/2022   GLUCOSE 111 (H) 02/08/2022   CHOL 191 08/15/2020   TRIG 139.0 08/15/2020   HDL 60.80 08/15/2020   LDLDIRECT 156.5 05/27/2011   LDLCALC 103 (H) 08/15/2020   ALT 8 02/08/2022   AST 13 (L) 02/08/2022   NA 143 02/08/2022   K 5.0 02/08/2022   CL 108 02/08/2022   CREATININE 2.48 (H) 02/08/2022   BUN 46 (H) 02/08/2022   CO2 27 02/08/2022   TSH 0.06 (L) 08/18/2021   HGBA1C 5.2 08/18/2021   MICROALBUR 0.50 12/15/2012     Assessment & Plan:    See Problem List for Assessment and Plan of chronic medical problems.

## 2022-02-22 NOTE — Patient Instructions (Addendum)
     Blood work was ordered.     Medications changes include :   none     A referral was ordered for dermatology.     Someone from that office will call you to schedule an appointment.    Return in about 6 months (around 08/25/2022) for follow up.

## 2022-02-23 ENCOUNTER — Ambulatory Visit (INDEPENDENT_AMBULATORY_CARE_PROVIDER_SITE_OTHER): Payer: Medicare Other | Admitting: Internal Medicine

## 2022-02-23 ENCOUNTER — Encounter: Payer: Self-pay | Admitting: Internal Medicine

## 2022-02-23 VITALS — BP 118/60 | HR 79 | Temp 98.5°F | Ht 64.0 in | Wt 205.6 lb

## 2022-02-23 DIAGNOSIS — E7849 Other hyperlipidemia: Secondary | ICD-10-CM | POA: Diagnosis not present

## 2022-02-23 DIAGNOSIS — I10 Essential (primary) hypertension: Secondary | ICD-10-CM

## 2022-02-23 DIAGNOSIS — R739 Hyperglycemia, unspecified: Secondary | ICD-10-CM

## 2022-02-23 DIAGNOSIS — E890 Postprocedural hypothyroidism: Secondary | ICD-10-CM

## 2022-02-23 DIAGNOSIS — R21 Rash and other nonspecific skin eruption: Secondary | ICD-10-CM | POA: Diagnosis not present

## 2022-02-23 DIAGNOSIS — N184 Chronic kidney disease, stage 4 (severe): Secondary | ICD-10-CM | POA: Diagnosis not present

## 2022-02-23 DIAGNOSIS — Z1159 Encounter for screening for other viral diseases: Secondary | ICD-10-CM | POA: Diagnosis not present

## 2022-02-23 DIAGNOSIS — Z113 Encounter for screening for infections with a predominantly sexual mode of transmission: Secondary | ICD-10-CM | POA: Diagnosis not present

## 2022-02-23 DIAGNOSIS — Z114 Encounter for screening for human immunodeficiency virus [HIV]: Secondary | ICD-10-CM

## 2022-02-23 DIAGNOSIS — K219 Gastro-esophageal reflux disease without esophagitis: Secondary | ICD-10-CM | POA: Diagnosis not present

## 2022-02-23 LAB — LIPID PANEL
Cholesterol: 184 mg/dL (ref 0–200)
HDL: 55.6 mg/dL (ref >39.00)
LDL Cholesterol: 103 mg/dL — ABNORMAL HIGH (ref 0–99)
NonHDL: 128.73
Total CHOL/HDL Ratio: 3
Triglycerides: 129 mg/dL (ref 0.0–149.0)
VLDL: 25.8 mg/dL (ref 0.0–40.0)

## 2022-02-23 NOTE — Assessment & Plan Note (Signed)
Chronic Following with nephrology  

## 2022-02-23 NOTE — Assessment & Plan Note (Signed)
Chronic  Clinically euthyroid Check tsh and will titrate med dose if needed Currently taking levothyroxine levothyroxine 175 mcg daily

## 2022-02-23 NOTE — Assessment & Plan Note (Signed)
Subacute-started around May of this year Has been referred to dermatology twice, but unfortunately 1 of those practices has closed and she never heard from the other practice No pain, not itchy Will check RPR, hepatitis C Has asked rheumatology about this and they did not feel it was autoimmune-advised to ask Dr. Amil Amen when she sees him next as well We will refer to dermatology

## 2022-02-23 NOTE — Assessment & Plan Note (Addendum)
Chronic Blood pressure well controlled Continue carvedilol 25 mg twice daily, hydralazine 50 mg 3 times daily, spironolactone 25 mg daily, telmisartan 40 mg daily

## 2022-02-23 NOTE — Assessment & Plan Note (Signed)
Chronic °Regular exercise and healthy diet encouraged °Check lipid panel  °Continue atorvastatin 20 mg daily °

## 2022-02-23 NOTE — Assessment & Plan Note (Signed)
Chronic GERD controlled Continue famotidine 40 mg twice daily 

## 2022-02-24 LAB — HEMOGLOBIN A1C: Hgb A1c MFr Bld: 4.6 % (ref 4.6–6.5)

## 2022-02-24 LAB — TSH: TSH: 0.76 u[IU]/mL (ref 0.35–5.50)

## 2022-02-24 LAB — RPR: RPR Ser Ql: NONREACTIVE

## 2022-02-24 LAB — HEPATITIS C ANTIBODY: Hepatitis C Ab: NONREACTIVE

## 2022-03-08 ENCOUNTER — Inpatient Hospital Stay: Payer: Medicare Other

## 2022-03-08 ENCOUNTER — Encounter: Payer: Self-pay | Admitting: Family

## 2022-03-08 ENCOUNTER — Inpatient Hospital Stay (HOSPITAL_BASED_OUTPATIENT_CLINIC_OR_DEPARTMENT_OTHER): Payer: Medicare Other | Admitting: Family

## 2022-03-08 ENCOUNTER — Inpatient Hospital Stay: Payer: Medicare Other | Attending: Hematology & Oncology

## 2022-03-08 VITALS — BP 154/67 | HR 92 | Temp 98.8°F | Resp 18 | Wt 204.0 lb

## 2022-03-08 DIAGNOSIS — D508 Other iron deficiency anemias: Secondary | ICD-10-CM | POA: Diagnosis not present

## 2022-03-08 DIAGNOSIS — N181 Chronic kidney disease, stage 1: Secondary | ICD-10-CM

## 2022-03-08 DIAGNOSIS — N182 Chronic kidney disease, stage 2 (mild): Secondary | ICD-10-CM | POA: Diagnosis not present

## 2022-03-08 DIAGNOSIS — D631 Anemia in chronic kidney disease: Secondary | ICD-10-CM | POA: Insufficient documentation

## 2022-03-08 DIAGNOSIS — D509 Iron deficiency anemia, unspecified: Secondary | ICD-10-CM | POA: Insufficient documentation

## 2022-03-08 DIAGNOSIS — D472 Monoclonal gammopathy: Secondary | ICD-10-CM

## 2022-03-08 LAB — CMP (CANCER CENTER ONLY)
ALT: 14 U/L (ref 0–44)
AST: 18 U/L (ref 15–41)
Albumin: 4.3 g/dL (ref 3.5–5.0)
Alkaline Phosphatase: 75 U/L (ref 38–126)
Anion gap: 9 (ref 5–15)
BUN: 40 mg/dL — ABNORMAL HIGH (ref 8–23)
CO2: 27 mmol/L (ref 22–32)
Calcium: 9.2 mg/dL (ref 8.9–10.3)
Chloride: 105 mmol/L (ref 98–111)
Creatinine: 2.44 mg/dL — ABNORMAL HIGH (ref 0.44–1.00)
GFR, Estimated: 20 mL/min — ABNORMAL LOW (ref >60)
Glucose, Bld: 111 mg/dL — ABNORMAL HIGH (ref 70–99)
Potassium: 4.3 mmol/L (ref 3.5–5.1)
Sodium: 141 mmol/L (ref 135–145)
Total Bilirubin: 0.4 mg/dL (ref 0.3–1.2)
Total Protein: 7.3 g/dL (ref 6.5–8.1)

## 2022-03-08 LAB — RETICULOCYTES
Immature Retic Fract: 6 % (ref 2.3–15.9)
RBC.: 3.74 MIL/uL — ABNORMAL LOW (ref 3.87–5.11)
Retic Count, Absolute: 38.9 10*3/uL (ref 19.0–186.0)
Retic Ct Pct: 1 % (ref 0.4–3.1)

## 2022-03-08 LAB — CBC WITH DIFFERENTIAL (CANCER CENTER ONLY)
Abs Immature Granulocytes: 0.01 10*3/uL (ref 0.00–0.07)
Basophils Absolute: 0 10*3/uL (ref 0.0–0.1)
Basophils Relative: 1 %
Eosinophils Absolute: 0.1 10*3/uL (ref 0.0–0.5)
Eosinophils Relative: 4 %
HCT: 38.5 % (ref 36.0–46.0)
Hemoglobin: 12 g/dL (ref 12.0–15.0)
Immature Granulocytes: 0 %
Lymphocytes Relative: 62 %
Lymphs Abs: 2.1 10*3/uL (ref 0.7–4.0)
MCH: 31.8 pg (ref 26.0–34.0)
MCHC: 31.2 g/dL (ref 30.0–36.0)
MCV: 102.1 fL — ABNORMAL HIGH (ref 80.0–100.0)
Monocytes Absolute: 0.4 10*3/uL (ref 0.1–1.0)
Monocytes Relative: 11 %
Neutro Abs: 0.7 10*3/uL — ABNORMAL LOW (ref 1.7–7.7)
Neutrophils Relative %: 22 %
Platelet Count: 166 10*3/uL (ref 150–400)
RBC: 3.77 MIL/uL — ABNORMAL LOW (ref 3.87–5.11)
RDW: 13.6 % (ref 11.5–15.5)
WBC Count: 3.4 10*3/uL — ABNORMAL LOW (ref 4.0–10.5)
nRBC: 0 % (ref 0.0–0.2)

## 2022-03-08 LAB — IRON AND IRON BINDING CAPACITY (CC-WL,HP ONLY)
Iron: 158 ug/dL (ref 28–170)
Saturation Ratios: 49 % — ABNORMAL HIGH (ref 10.4–31.8)
TIBC: 323 ug/dL (ref 250–450)
UIBC: 165 ug/dL (ref 148–442)

## 2022-03-08 LAB — FERRITIN: Ferritin: 93 ng/mL (ref 11–307)

## 2022-03-08 NOTE — Progress Notes (Signed)
Hematology and Oncology Follow Up Visit  Tina Molina [id number] [birth year] 77 y.o. 03/08/2022   Principle Diagnosis:  Anemia of chronic renal failure - stage II MGUS - IgA lambda Iron deficiency anemia   Current Therapy:        Aranesp 300 mcg SQ to keep Hgb > 11 IV iron as indicated   Interim History:  Ms. Hegarty is here today for follow-up. She is doing well but feels fatigued. She has not noted any abnormal blood loss. No bruising or petechiae.  She has occasional SOB with over exertion (cleaning and stairs).  No fever, chills, n/v, cough, rash, dizziness, chest pain, palpitations, abdominal pain or changes in bowel or bladder habits.   No swelling or tenderness in her extremities. She has occasional tingling in her hands and feet that comes and goes.  No falls or syncope.  Appetite is great. She admits that she needs to hydrate better throughout the day. Weight is stale at 204 lbs.   ECOG Performance Status: 1 - Symptomatic but completely ambulatory  Medications:  Allergies as of 03/08/2022       Reactions   Penicillins Other (See Comments)   Intolerance or allergy not remembered; her sister stated penicillin caused "boils under the skin"   Shellfish Allergy Rash   Shellfish-derived Products Anaphylaxis   Codeine Nausea Only   Nausea only with liquid codeine   Infliximab Other (See Comments)    Confusion (intolerance) Knots under the skin   Sulfonamide Derivatives Rash   Joint pain   Azithromycin Itching   Iodine Other (See Comments)   UNKNOWN REACTION        Medication List        Accurate as of March 08, 2022  1:23 PM. If you have any questions, ask your nurse or doctor.          aspirin 81 MG tablet Take 81 mg by mouth daily.   atorvastatin 20 MG tablet Commonly known as: LIPITOR TAKE ONE TABLET BY MOUTH ONCE DAILY   budesonide-formoterol 160-4.5 MCG/ACT inhaler Commonly known as: Symbicort inhale 2 puffs if needed for wheezing    carvedilol 25 MG tablet Commonly known as: COREG TAKE ONE TABLET BY MOUTH TWICE DAILY   cyanocobalamin 100 MCG tablet Take 100 mcg by mouth daily.   cyclobenzaprine 10 MG tablet Commonly known as: FLEXERIL TAKE ONE TABLET BY MOUTH THREE TIMES DAILY   famotidine 40 MG tablet Commonly known as: PEPCID TAKE ONE TABLET BY MOUTH TWICE DAILY   furosemide 20 MG tablet Commonly known as: LASIX TAKE ONE TABLET BY MOUTH ONCE DAILY   gabapentin 300 MG capsule Commonly known as: NEURONTIN TAKE ONE CAPSULE BY MOUTH THREE TIMES DAILY   hydrALAZINE 50 MG tablet Commonly known as: APRESOLINE TAKE ONE TABLET BY MOUTH THREE TIMES DAILY   latanoprost 0.005 % ophthalmic solution Commonly known as: XALATAN 1 drop at bedtime.   leflunomide 20 MG tablet Commonly known as: ARAVA Take 20 mg by mouth daily.   levothyroxine 175 MCG tablet Commonly known as: SYNTHROID Take 1 tablet (175 mcg total) by mouth daily.   MELATONIN GUMMIES PO Take by mouth at bedtime as needed.   multivitamin with minerals Tabs tablet Take 1 tablet by mouth daily.   spironolactone 25 MG tablet Commonly known as: ALDACTONE TAKE ONE TABLET BY MOUTH ONCE DAILY   telmisartan 40 MG tablet Commonly known as: MICARDIS TAKE ONE TABLET BY MOUTH ONCE DAILY   triamcinolone ointment 0.5 % Commonly known as:  KENALOG APPLY TO THE AFFECTED AREA TWICE DAILY USE FOR UPTO 14 DAYS   vitamin E 180 MG (400 UNITS) capsule Take 400 Units by mouth daily.        Allergies:  Allergies  Allergen Reactions   Penicillins Other (See Comments)    Intolerance or allergy not remembered; her sister stated penicillin caused "boils under the skin"   Shellfish Allergy Rash   Shellfish-Derived Products Anaphylaxis   Codeine Nausea Only    Nausea only with liquid codeine   Infliximab Other (See Comments)     Confusion (intolerance) Knots under the skin   Sulfonamide Derivatives Rash    Joint pain   Azithromycin Itching    Iodine Other (See Comments)    UNKNOWN REACTION    Past Medical History, Surgical history, Social history, and Family History were reviewed and updated.  Review of Systems: All other 10 point review of systems is negative.   Physical Exam:  vitals were not taken for this visit.   Wt Readings from Last 3 Encounters:  02/23/22 205 lb 9.6 oz (93.3 kg)  02/08/22 204 lb 12.8 oz (92.9 kg)  01/15/22 207 lb (93.9 kg)    Ocular: Sclerae unicteric, pupils equal, round and reactive to light Ear-nose-throat: Oropharynx clear, dentition fair Lymphatic: No cervical or supraclavicular adenopathy Lungs no rales or rhonchi, good excursion bilaterally Heart regular rate and rhythm, no murmur appreciated Abd soft, nontender, positive bowel sounds MSK no focal spinal tenderness, no joint edema Neuro: non-focal, well-oriented, appropriate affect Breasts: Deferred   Lab Results  Component Value Date   WBC 3.4 (L) 03/08/2022   HGB 12.0 03/08/2022   HCT 38.5 03/08/2022   MCV 102.1 (H) 03/08/2022   PLT 166 03/08/2022   Lab Results  Component Value Date   FERRITIN 125 02/08/2022   IRON 104 02/08/2022   TIBC 328 02/08/2022   UIBC 224 02/08/2022   IRONPCTSAT 32 (H) 02/08/2022   Lab Results  Component Value Date   RETICCTPCT 1.0 03/08/2022   RBC 3.77 (L) 03/08/2022   RBC 3.74 (L) 03/08/2022   RETICCTABS 51.6 01/03/2013   Lab Results  Component Value Date   KPAFRELGTCHN 26.5 (H) 04/24/2021   LAMBDASER 36.5 (H) 04/24/2021   KAPLAMBRATIO 0.73 04/24/2021   Lab Results  Component Value Date   IGGSERUM 989 04/24/2021   IGA 553 (H) 04/24/2021   IGMSERUM 24 (L) 04/24/2021   Lab Results  Component Value Date   TOTALPROTELP 6.9 04/24/2021   ALBUMINELP 3.9 04/24/2021   A1GS 0.2 04/24/2021   A2GS 0.7 04/24/2021   BETS 1.2 04/24/2021   BETA2SER 10.3 (H) 11/04/2011   GAMS 0.9 04/24/2021   MSPIKE 0.3 (H) 04/24/2021   SPEI Comment 04/24/2021     Chemistry      Component Value  Date/Time   NA 143 02/08/2022 1305   NA 150 (H) 03/01/2017 1308   NA 143 04/05/2016 1156   K 5.0 02/08/2022 1305   K 4.0 03/01/2017 1308   K 3.8 04/05/2016 1156   CL 108 02/08/2022 1305   CL 109 (H) 03/01/2017 1308   CO2 27 02/08/2022 1305   CO2 28 03/01/2017 1308   CO2 21 (L) 04/05/2016 1156   BUN 46 (H) 02/08/2022 1305   BUN 19 03/01/2017 1308   BUN 18.5 04/05/2016 1156   CREATININE 2.48 (H) 02/08/2022 1305   CREATININE 1.8 (H) 03/01/2017 1308   CREATININE 1.5 (H) 04/05/2016 1156      Component Value Date/Time   CALCIUM  9.6 02/08/2022 1305   CALCIUM 9.7 03/01/2017 1308   CALCIUM 9.3 04/05/2016 1156   ALKPHOS 70 02/08/2022 1305   ALKPHOS 55 03/01/2017 1308   ALKPHOS 77 04/05/2016 1156   AST 13 (L) 02/08/2022 1305   AST 21 04/05/2016 1156   ALT 8 02/08/2022 1305   ALT 18 03/01/2017 1308   ALT 22 04/05/2016 1156   BILITOT 0.5 02/08/2022 1305   BILITOT 0.36 04/05/2016 1156       Impression and Plan: Ms. Kyllonen is a very pleasant 76 yo African American female with multifactorial anemia as well as an IgA lambda MGUS.  No ESA needed, Hgb 12.0.  Iron studies pending.  Follow-up in 4 weeks.  [name], NP 10/23/20231:23 PM

## 2022-03-09 LAB — KAPPA/LAMBDA LIGHT CHAINS
Kappa free light chain: 29.8 mg/L — ABNORMAL HIGH (ref 3.3–19.4)
Kappa, lambda light chain ratio: 0.68 (ref 0.26–1.65)
Lambda free light chains: 44 mg/L — ABNORMAL HIGH (ref 5.7–26.3)

## 2022-03-10 LAB — IGG, IGA, IGM
IgA: 600 mg/dL — ABNORMAL HIGH (ref 64–422)
IgG (Immunoglobin G), Serum: 1061 mg/dL (ref 586–1602)
IgM (Immunoglobulin M), Srm: 28 mg/dL (ref 26–217)

## 2022-03-11 LAB — PROTEIN ELECTROPHORESIS, SERUM
A/G Ratio: 1.3 (ref 0.7–1.7)
Albumin ELP: 4 g/dL (ref 2.9–4.4)
Alpha-1-Globulin: 0.2 g/dL (ref 0.0–0.4)
Alpha-2-Globulin: 0.7 g/dL (ref 0.4–1.0)
Beta Globulin: 1.4 g/dL — ABNORMAL HIGH (ref 0.7–1.3)
Gamma Globulin: 1 g/dL (ref 0.4–1.8)
Globulin, Total: 3.2 g/dL (ref 2.2–3.9)
M-Spike, %: 0.3 g/dL — ABNORMAL HIGH
Total Protein ELP: 7.2 g/dL (ref 6.0–8.5)

## 2022-03-16 ENCOUNTER — Telehealth: Payer: Self-pay | Admitting: *Deleted

## 2022-03-16 DIAGNOSIS — H40053 Ocular hypertension, bilateral: Secondary | ICD-10-CM | POA: Diagnosis not present

## 2022-03-16 NOTE — Telephone Encounter (Signed)
Per 03/08/22 los - called and lvm of upcoming appointments - requested callback to confirm

## 2022-03-24 DIAGNOSIS — D860 Sarcoidosis of lung: Secondary | ICD-10-CM | POA: Diagnosis not present

## 2022-03-24 DIAGNOSIS — R21 Rash and other nonspecific skin eruption: Secondary | ICD-10-CM | POA: Diagnosis not present

## 2022-03-24 DIAGNOSIS — N390 Urinary tract infection, site not specified: Secondary | ICD-10-CM | POA: Diagnosis not present

## 2022-03-24 DIAGNOSIS — I129 Hypertensive chronic kidney disease with stage 1 through stage 4 chronic kidney disease, or unspecified chronic kidney disease: Secondary | ICD-10-CM | POA: Diagnosis not present

## 2022-03-24 DIAGNOSIS — D472 Monoclonal gammopathy: Secondary | ICD-10-CM | POA: Diagnosis not present

## 2022-03-24 DIAGNOSIS — N1832 Chronic kidney disease, stage 3b: Secondary | ICD-10-CM | POA: Diagnosis not present

## 2022-03-24 DIAGNOSIS — M069 Rheumatoid arthritis, unspecified: Secondary | ICD-10-CM | POA: Diagnosis not present

## 2022-03-25 DIAGNOSIS — R21 Rash and other nonspecific skin eruption: Secondary | ICD-10-CM | POA: Diagnosis not present

## 2022-03-25 DIAGNOSIS — M5136 Other intervertebral disc degeneration, lumbar region: Secondary | ICD-10-CM | POA: Diagnosis not present

## 2022-03-25 DIAGNOSIS — D860 Sarcoidosis of lung: Secondary | ICD-10-CM | POA: Diagnosis not present

## 2022-03-25 DIAGNOSIS — N184 Chronic kidney disease, stage 4 (severe): Secondary | ICD-10-CM | POA: Diagnosis not present

## 2022-03-25 DIAGNOSIS — E669 Obesity, unspecified: Secondary | ICD-10-CM | POA: Diagnosis not present

## 2022-03-25 DIAGNOSIS — M1991 Primary osteoarthritis, unspecified site: Secondary | ICD-10-CM | POA: Diagnosis not present

## 2022-03-25 DIAGNOSIS — M0589 Other rheumatoid arthritis with rheumatoid factor of multiple sites: Secondary | ICD-10-CM | POA: Diagnosis not present

## 2022-03-25 DIAGNOSIS — M25512 Pain in left shoulder: Secondary | ICD-10-CM | POA: Diagnosis not present

## 2022-03-25 DIAGNOSIS — Z6835 Body mass index (BMI) 35.0-35.9, adult: Secondary | ICD-10-CM | POA: Diagnosis not present

## 2022-04-07 ENCOUNTER — Inpatient Hospital Stay (HOSPITAL_BASED_OUTPATIENT_CLINIC_OR_DEPARTMENT_OTHER): Payer: Medicare Other | Admitting: Family

## 2022-04-07 ENCOUNTER — Inpatient Hospital Stay: Payer: Medicare Other | Attending: Hematology & Oncology

## 2022-04-07 ENCOUNTER — Inpatient Hospital Stay: Payer: Medicare Other

## 2022-04-07 ENCOUNTER — Encounter: Payer: Self-pay | Admitting: Family

## 2022-04-07 VITALS — BP 147/68 | HR 91 | Temp 98.8°F | Resp 18

## 2022-04-07 DIAGNOSIS — D631 Anemia in chronic kidney disease: Secondary | ICD-10-CM

## 2022-04-07 DIAGNOSIS — D508 Other iron deficiency anemias: Secondary | ICD-10-CM

## 2022-04-07 DIAGNOSIS — D509 Iron deficiency anemia, unspecified: Secondary | ICD-10-CM | POA: Insufficient documentation

## 2022-04-07 DIAGNOSIS — D472 Monoclonal gammopathy: Secondary | ICD-10-CM | POA: Diagnosis not present

## 2022-04-07 DIAGNOSIS — N182 Chronic kidney disease, stage 2 (mild): Secondary | ICD-10-CM | POA: Insufficient documentation

## 2022-04-07 LAB — CBC WITH DIFFERENTIAL (CANCER CENTER ONLY)
Abs Immature Granulocytes: 0.02 10*3/uL (ref 0.00–0.07)
Basophils Absolute: 0 10*3/uL (ref 0.0–0.1)
Basophils Relative: 1 %
Eosinophils Absolute: 0.2 10*3/uL (ref 0.0–0.5)
Eosinophils Relative: 4 %
HCT: 33.3 % — ABNORMAL LOW (ref 36.0–46.0)
Hemoglobin: 10.5 g/dL — ABNORMAL LOW (ref 12.0–15.0)
Immature Granulocytes: 0 %
Lymphocytes Relative: 56 %
Lymphs Abs: 2.6 10*3/uL (ref 0.7–4.0)
MCH: 31.1 pg (ref 26.0–34.0)
MCHC: 31.5 g/dL (ref 30.0–36.0)
MCV: 98.5 fL (ref 80.0–100.0)
Monocytes Absolute: 0.6 10*3/uL (ref 0.1–1.0)
Monocytes Relative: 13 %
Neutro Abs: 1.2 10*3/uL — ABNORMAL LOW (ref 1.7–7.7)
Neutrophils Relative %: 26 %
Platelet Count: 184 10*3/uL (ref 150–400)
RBC: 3.38 MIL/uL — ABNORMAL LOW (ref 3.87–5.11)
RDW: 13.4 % (ref 11.5–15.5)
WBC Count: 4.7 10*3/uL (ref 4.0–10.5)
nRBC: 0 % (ref 0.0–0.2)

## 2022-04-07 LAB — FERRITIN: Ferritin: 152 ng/mL (ref 11–307)

## 2022-04-07 LAB — CMP (CANCER CENTER ONLY)
ALT: 16 U/L (ref 0–44)
AST: 18 U/L (ref 15–41)
Albumin: 4.6 g/dL (ref 3.5–5.0)
Alkaline Phosphatase: 73 U/L (ref 38–126)
Anion gap: 11 (ref 5–15)
BUN: 64 mg/dL — ABNORMAL HIGH (ref 8–23)
CO2: 23 mmol/L (ref 22–32)
Calcium: 9.3 mg/dL (ref 8.9–10.3)
Chloride: 106 mmol/L (ref 98–111)
Creatinine: 2.71 mg/dL — ABNORMAL HIGH (ref 0.44–1.00)
GFR, Estimated: 18 mL/min — ABNORMAL LOW (ref >60)
Glucose, Bld: 115 mg/dL — ABNORMAL HIGH (ref 70–99)
Potassium: 4.6 mmol/L (ref 3.5–5.1)
Sodium: 140 mmol/L (ref 135–145)
Total Bilirubin: 0.3 mg/dL (ref 0.3–1.2)
Total Protein: 7.6 g/dL (ref 6.5–8.1)

## 2022-04-07 LAB — RETICULOCYTES
Immature Retic Fract: 10.7 % (ref 2.3–15.9)
RBC.: 3.46 MIL/uL — ABNORMAL LOW (ref 3.87–5.11)
Retic Count, Absolute: 40.8 10*3/uL (ref 19.0–186.0)
Retic Ct Pct: 1.2 % (ref 0.4–3.1)

## 2022-04-07 LAB — IRON AND IRON BINDING CAPACITY (CC-WL,HP ONLY)
Iron: 130 ug/dL (ref 28–170)
Saturation Ratios: 39 % — ABNORMAL HIGH (ref 10.4–31.8)
TIBC: 337 ug/dL (ref 250–450)
UIBC: 207 ug/dL (ref 148–442)

## 2022-04-07 MED ORDER — DARBEPOETIN ALFA 300 MCG/0.6ML IJ SOSY
300.0000 ug | PREFILLED_SYRINGE | Freq: Once | INTRAMUSCULAR | Status: AC
  Administered 2022-04-07: 300 ug via SUBCUTANEOUS
  Filled 2022-04-07: qty 0.6

## 2022-04-07 NOTE — Patient Instructions (Signed)

## 2022-04-07 NOTE — Progress Notes (Signed)
Hematology and Oncology Follow Up Visit  Tina Molina [id number] 07-Apr-1946 76 y.o. 04/07/2022   Principle Diagnosis:  Anemia of chronic renal failure - stage II MGUS - IgA lambda Iron deficiency anemia   Current Therapy:        Aranesp 300 mcg SQ to keep Hgb > 11 IV iron as indicated   Interim History:  Tina Molina is here today for follow-up and injection. She is doing well and has no complaints at this time.  She has occasional episodes of fatigue.  She has mild SOB with over exertion and will take a break to rest when needed.  Occasional palpitations.  No fever, chills, n/v, cough, rash, dizziness, cheat pain, abdominal pain or changes in bowel or bladder habits.  No blood loss, bruising or petechiae.  No swelling, tenderness, numbness or tingling in her extremities at this time.  No falls or syncope.  Appetite and hydration are good. Weight is stable at 208 lbs.   ECOG Performance Status: 1 - Symptomatic but completely ambulatory  Medications:  Allergies as of 04/07/2022       Reactions   Penicillins Other (See Comments)   Intolerance or allergy not remembered; her sister stated penicillin caused "boils under the skin"   Shellfish Allergy Rash   Shellfish-derived Products Anaphylaxis   Codeine Nausea Only   Nausea only with liquid codeine   Infliximab Other (See Comments)    Confusion (intolerance) Knots under the skin   Sulfonamide Derivatives Rash   Joint pain   Azithromycin Itching   Iodine Other (See Comments)   UNKNOWN REACTION        Medication List        Accurate as of April 07, 2022  1:09 PM. If you have any questions, ask your nurse or doctor.          aspirin 81 MG tablet Take 81 mg by mouth daily.   atorvastatin 20 MG tablet Commonly known as: LIPITOR TAKE ONE TABLET BY MOUTH ONCE DAILY   budesonide-formoterol 160-4.5 MCG/ACT inhaler Commonly known as: Symbicort inhale 2 puffs if needed for wheezing   carvedilol 25 MG  tablet Commonly known as: COREG TAKE ONE TABLET BY MOUTH TWICE DAILY   cyanocobalamin 100 MCG tablet Take 100 mcg by mouth daily.   cyclobenzaprine 10 MG tablet Commonly known as: FLEXERIL TAKE ONE TABLET BY MOUTH THREE TIMES DAILY   famotidine 40 MG tablet Commonly known as: PEPCID TAKE ONE TABLET BY MOUTH TWICE DAILY   furosemide 20 MG tablet Commonly known as: LASIX TAKE ONE TABLET BY MOUTH ONCE DAILY   gabapentin 300 MG capsule Commonly known as: NEURONTIN TAKE ONE CAPSULE BY MOUTH THREE TIMES DAILY   hydrALAZINE 50 MG tablet Commonly known as: APRESOLINE TAKE ONE TABLET BY MOUTH THREE TIMES DAILY   latanoprost 0.005 % ophthalmic solution Commonly known as: XALATAN 1 drop at bedtime.   leflunomide 20 MG tablet Commonly known as: ARAVA Take 20 mg by mouth daily.   levothyroxine 175 MCG tablet Commonly known as: SYNTHROID Take 1 tablet (175 mcg total) by mouth daily.   MELATONIN GUMMIES PO Take by mouth at bedtime as needed.   multivitamin with minerals Tabs tablet Take 1 tablet by mouth daily.   spironolactone 25 MG tablet Commonly known as: ALDACTONE TAKE ONE TABLET BY MOUTH ONCE DAILY   telmisartan 40 MG tablet Commonly known as: MICARDIS TAKE ONE TABLET BY MOUTH ONCE DAILY   triamcinolone ointment 0.5 % Commonly known as: KENALOG APPLY  TO THE AFFECTED AREA TWICE DAILY USE FOR UPTO 14 DAYS   vitamin E 180 MG (400 UNITS) capsule Take 400 Units by mouth daily.        Allergies:  Allergies  Allergen Reactions   Penicillins Other (See Comments)    Intolerance or allergy not remembered; her sister stated penicillin caused "boils under the skin"   Shellfish Allergy Rash   Shellfish-Derived Products Anaphylaxis   Codeine Nausea Only    Nausea only with liquid codeine   Infliximab Other (See Comments)     Confusion (intolerance) Knots under the skin   Sulfonamide Derivatives Rash    Joint pain   Azithromycin Itching   Iodine Other (See  Comments)    UNKNOWN REACTION    Past Medical History, Surgical history, Social history, and Family History were reviewed and updated.  Review of Systems: All other 10 point review of systems is negative.   Physical Exam:  oral temperature is 98.8 F (37.1 C). Her blood pressure is 147/68 (abnormal) and her pulse is 91. Her respiration is 18 and oxygen saturation is 98%.   Wt Readings from Last 3 Encounters:  03/08/22 204 lb (92.5 kg)  02/23/22 205 lb 9.6 oz (93.3 kg)  02/08/22 204 lb 12.8 oz (92.9 kg)    Ocular: Sclerae unicteric, pupils equal, round and reactive to light Ear-nose-throat: Oropharynx clear, dentition fair Lymphatic: No cervical or supraclavicular adenopathy Lungs no rales or rhonchi, good excursion bilaterally Heart regular rate and rhythm, no murmur appreciated Abd soft, nontender, positive bowel sounds MSK no focal spinal tenderness, no joint edema Neuro: non-focal, well-oriented, appropriate affect Breasts: Deferred   Lab Results  Component Value Date   WBC 4.7 04/07/2022   HGB 10.5 (L) 04/07/2022   HCT 33.3 (L) 04/07/2022   MCV 98.5 04/07/2022   PLT 184 04/07/2022   Lab Results  Component Value Date   FERRITIN 93 03/08/2022   IRON 158 03/08/2022   TIBC 323 03/08/2022   UIBC 165 03/08/2022   IRONPCTSAT 49 (H) 03/08/2022   Lab Results  Component Value Date   RETICCTPCT 1.2 04/07/2022   RBC 3.46 (L) 04/07/2022   RETICCTABS 51.6 01/03/2013   Lab Results  Component Value Date   KPAFRELGTCHN 29.8 (H) 03/08/2022   LAMBDASER 44.0 (H) 03/08/2022   KAPLAMBRATIO 0.68 03/08/2022   Lab Results  Component Value Date   IGGSERUM 1,061 03/08/2022   IGA 600 (H) 03/08/2022   IGMSERUM 28 03/08/2022   Lab Results  Component Value Date   TOTALPROTELP 7.2 03/08/2022   ALBUMINELP 4.0 03/08/2022   A1GS 0.2 03/08/2022   A2GS 0.7 03/08/2022   BETS 1.4 (H) 03/08/2022   BETA2SER 10.3 (H) 11/04/2011   GAMS 1.0 03/08/2022   MSPIKE 0.3 (H) 03/08/2022    SPEI Comment 03/08/2022     Chemistry      Component Value Date/Time   NA 141 03/08/2022 1300   NA 150 (H) 03/01/2017 1308   NA 143 04/05/2016 1156   K 4.3 03/08/2022 1300   K 4.0 03/01/2017 1308   K 3.8 04/05/2016 1156   CL 105 03/08/2022 1300   CL 109 (H) 03/01/2017 1308   CO2 27 03/08/2022 1300   CO2 28 03/01/2017 1308   CO2 21 (L) 04/05/2016 1156   BUN 40 (H) 03/08/2022 1300   BUN 19 03/01/2017 1308   BUN 18.5 04/05/2016 1156   CREATININE 2.44 (H) 03/08/2022 1300   CREATININE 1.8 (H) 03/01/2017 1308   CREATININE 1.5 (H) 04/05/2016  1156      Component Value Date/Time   CALCIUM 9.2 03/08/2022 1300   CALCIUM 9.7 03/01/2017 1308   CALCIUM 9.3 04/05/2016 1156   ALKPHOS 75 03/08/2022 1300   ALKPHOS 55 03/01/2017 1308   ALKPHOS 77 04/05/2016 1156   AST 18 03/08/2022 1300   AST 21 04/05/2016 1156   ALT 14 03/08/2022 1300   ALT 18 03/01/2017 1308   ALT 22 04/05/2016 1156   BILITOT 0.4 03/08/2022 1300   BILITOT 0.36 04/05/2016 1156       Impression and Plan: Ms. Leder is a very pleasant 76 yo African American female with multifactorial anemia as well as an IgA lambda MGUS.  ESA given, Hgb 10.5.  Iron studies pending.  Follow-up in 4 weeks.  [name], NP 11/22/20231:09 PM

## 2022-04-09 LAB — IGG, IGA, IGM
IgA: 577 mg/dL — ABNORMAL HIGH (ref 64–422)
IgG (Immunoglobin G), Serum: 1044 mg/dL (ref 586–1602)
IgM (Immunoglobulin M), Srm: 25 mg/dL — ABNORMAL LOW (ref 26–217)

## 2022-04-09 LAB — KAPPA/LAMBDA LIGHT CHAINS
Kappa free light chain: 35.5 mg/L — ABNORMAL HIGH (ref 3.3–19.4)
Kappa, lambda light chain ratio: 0.77 (ref 0.26–1.65)
Lambda free light chains: 46.1 mg/L — ABNORMAL HIGH (ref 5.7–26.3)

## 2022-04-13 LAB — PROTEIN ELECTROPHORESIS, SERUM
A/G Ratio: 1.3 (ref 0.7–1.7)
Albumin ELP: 4 g/dL (ref 2.9–4.4)
Alpha-1-Globulin: 0.2 g/dL (ref 0.0–0.4)
Alpha-2-Globulin: 0.7 g/dL (ref 0.4–1.0)
Beta Globulin: 1.4 g/dL — ABNORMAL HIGH (ref 0.7–1.3)
Gamma Globulin: 0.9 g/dL (ref 0.4–1.8)
Globulin, Total: 3.2 g/dL (ref 2.2–3.9)
M-Spike, %: 0.3 g/dL — ABNORMAL HIGH
Total Protein ELP: 7.2 g/dL (ref 6.0–8.5)

## 2022-04-21 ENCOUNTER — Other Ambulatory Visit: Payer: Self-pay | Admitting: Internal Medicine

## 2022-04-21 ENCOUNTER — Ambulatory Visit
Admission: RE | Admit: 2022-04-21 | Discharge: 2022-04-21 | Disposition: A | Discharge status: Home / Self Care | Payer: Medicare Other | Source: Ambulatory Visit | Attending: Internal Medicine | Admitting: Internal Medicine

## 2022-04-21 DIAGNOSIS — R921 Mammographic calcification found on diagnostic imaging of breast: Secondary | ICD-10-CM

## 2022-04-29 DIAGNOSIS — M48062 Spinal stenosis, lumbar region with neurogenic claudication: Secondary | ICD-10-CM | POA: Diagnosis not present

## 2022-04-29 DIAGNOSIS — Z6836 Body mass index (BMI) 36.0-36.9, adult: Secondary | ICD-10-CM | POA: Diagnosis not present

## 2022-05-04 DIAGNOSIS — L300 Nummular dermatitis: Secondary | ICD-10-CM | POA: Diagnosis not present

## 2022-05-19 ENCOUNTER — Encounter: Payer: Self-pay | Admitting: Family

## 2022-05-19 ENCOUNTER — Inpatient Hospital Stay: Payer: Medicare Other

## 2022-05-19 ENCOUNTER — Inpatient Hospital Stay (HOSPITAL_BASED_OUTPATIENT_CLINIC_OR_DEPARTMENT_OTHER): Payer: Medicare Other | Admitting: Family

## 2022-05-19 ENCOUNTER — Inpatient Hospital Stay: Payer: Medicare Other | Attending: Hematology & Oncology

## 2022-05-19 VITALS — BP 147/75 | HR 92 | Temp 98.3°F | Resp 17 | Wt 206.8 lb

## 2022-05-19 DIAGNOSIS — N184 Chronic kidney disease, stage 4 (severe): Secondary | ICD-10-CM | POA: Diagnosis present

## 2022-05-19 DIAGNOSIS — N181 Chronic kidney disease, stage 1: Secondary | ICD-10-CM

## 2022-05-19 DIAGNOSIS — D508 Other iron deficiency anemias: Secondary | ICD-10-CM

## 2022-05-19 DIAGNOSIS — E611 Iron deficiency: Secondary | ICD-10-CM | POA: Insufficient documentation

## 2022-05-19 DIAGNOSIS — D631 Anemia in chronic kidney disease: Secondary | ICD-10-CM

## 2022-05-19 DIAGNOSIS — D509 Iron deficiency anemia, unspecified: Secondary | ICD-10-CM | POA: Diagnosis not present

## 2022-05-19 DIAGNOSIS — D472 Monoclonal gammopathy: Secondary | ICD-10-CM | POA: Insufficient documentation

## 2022-05-19 DIAGNOSIS — N182 Chronic kidney disease, stage 2 (mild): Secondary | ICD-10-CM | POA: Insufficient documentation

## 2022-05-19 LAB — CMP (CANCER CENTER ONLY)
ALT: 11 U/L (ref 0–44)
AST: 14 U/L — ABNORMAL LOW (ref 15–41)
Albumin: 4.5 g/dL (ref 3.5–5.0)
Alkaline Phosphatase: 66 U/L (ref 38–126)
Anion gap: 10 (ref 5–15)
BUN: 62 mg/dL — ABNORMAL HIGH (ref 8–23)
CO2: 23 mmol/L (ref 22–32)
Calcium: 9.2 mg/dL (ref 8.9–10.3)
Chloride: 110 mmol/L (ref 98–111)
Creatinine: 2.52 mg/dL — ABNORMAL HIGH (ref 0.44–1.00)
GFR, Estimated: 19 mL/min — ABNORMAL LOW (ref >60)
Glucose, Bld: 115 mg/dL — ABNORMAL HIGH (ref 70–99)
Potassium: 4.6 mmol/L (ref 3.5–5.1)
Sodium: 143 mmol/L (ref 135–145)
Total Bilirubin: 0.5 mg/dL (ref 0.3–1.2)
Total Protein: 7.9 g/dL (ref 6.5–8.1)

## 2022-05-19 LAB — CBC WITH DIFFERENTIAL (CANCER CENTER ONLY)
Abs Immature Granulocytes: 0 10*3/uL (ref 0.00–0.07)
Basophils Absolute: 0 10*3/uL (ref 0.0–0.1)
Basophils Relative: 1 %
Eosinophils Absolute: 0.2 10*3/uL (ref 0.0–0.5)
Eosinophils Relative: 4 %
HCT: 32.2 % — ABNORMAL LOW (ref 36.0–46.0)
Hemoglobin: 10.2 g/dL — ABNORMAL LOW (ref 12.0–15.0)
Immature Granulocytes: 0 %
Lymphocytes Relative: 57 %
Lymphs Abs: 2.8 10*3/uL (ref 0.7–4.0)
MCH: 31.9 pg (ref 26.0–34.0)
MCHC: 31.7 g/dL (ref 30.0–36.0)
MCV: 100.6 fL — ABNORMAL HIGH (ref 80.0–100.0)
Monocytes Absolute: 0.6 10*3/uL (ref 0.1–1.0)
Monocytes Relative: 11 %
Neutro Abs: 1.3 10*3/uL — ABNORMAL LOW (ref 1.7–7.7)
Neutrophils Relative %: 27 %
Platelet Count: 162 10*3/uL (ref 150–400)
RBC: 3.2 MIL/uL — ABNORMAL LOW (ref 3.87–5.11)
RDW: 14.9 % (ref 11.5–15.5)
WBC Count: 4.9 10*3/uL (ref 4.0–10.5)
nRBC: 0 % (ref 0.0–0.2)

## 2022-05-19 LAB — RETICULOCYTES
Immature Retic Fract: 10.1 % (ref 2.3–15.9)
RBC.: 3.15 MIL/uL — ABNORMAL LOW (ref 3.87–5.11)
Retic Count, Absolute: 37.8 10*3/uL (ref 19.0–186.0)
Retic Ct Pct: 1.2 % (ref 0.4–3.1)

## 2022-05-19 LAB — FERRITIN: Ferritin: 170 ng/mL (ref 11–307)

## 2022-05-19 MED ORDER — DARBEPOETIN ALFA 300 MCG/0.6ML IJ SOSY
300.0000 ug | PREFILLED_SYRINGE | Freq: Once | INTRAMUSCULAR | Status: AC
  Administered 2022-05-19: 300 ug via SUBCUTANEOUS
  Filled 2022-05-19: qty 0.6

## 2022-05-19 NOTE — Patient Instructions (Signed)

## 2022-05-19 NOTE — Progress Notes (Signed)
Hematology and Oncology Follow Up Visit  Tina Molina [id number] 1945/07/19 77 y.o. 05/19/2022   Principle Diagnosis:  Anemia of chronic renal failure - stage II MGUS - IgA lambda Iron deficiency anemia   Current Therapy:        Aranesp 300 mcg SQ to keep Hgb > 11 IV iron as indicated   Interim History:  Tina Molina is here today for follow-up and injection. She is doing fairly well but does note fatigue.  She has not noted any blood loss, bruising or petechiae.  Mild SOB with over exertion when walking a long distance. She will take a break to rest and symptoms pass.  No fever, chills, n/v, cough, rash, dizziness, chest pain, palpitations, abdominal pain or changes in bowel or bladder habits.  No swelling in her extremities at this time. Numbness and tingling in her finger and feet comes and goes.  No falls or syncope reported.  Appetite and hydration are good. Weight is stable at 206 lbs.   ECOG Performance Status: 1 - Symptomatic but completely ambulatory  Medications:  Allergies as of 05/19/2022       Reactions   Penicillins Other (See Comments)   Intolerance or allergy not remembered; her sister stated penicillin caused "boils under the skin"   Shellfish Allergy Rash   Shellfish-derived Products Anaphylaxis   Codeine Nausea Only   Nausea only with liquid codeine   Infliximab Other (See Comments)    Confusion (intolerance) Knots under the skin   Sulfonamide Derivatives Rash   Joint pain   Azithromycin Itching   Iodine Other (See Comments)   UNKNOWN REACTION        Medication List        Accurate as of May 19, 2022  1:00 PM. If you have any questions, ask your nurse or doctor.          aspirin 81 MG tablet Take 81 mg by mouth daily.   atorvastatin 20 MG tablet Commonly known as: LIPITOR TAKE ONE TABLET BY MOUTH ONCE DAILY   budesonide-formoterol 160-4.5 MCG/ACT inhaler Commonly known as: Symbicort inhale 2 puffs if needed for wheezing    carvedilol 25 MG tablet Commonly known as: COREG TAKE ONE TABLET BY MOUTH TWICE DAILY   cyanocobalamin 100 MCG tablet Take 100 mcg by mouth daily.   cyclobenzaprine 10 MG tablet Commonly known as: FLEXERIL TAKE ONE TABLET BY MOUTH THREE TIMES DAILY   famotidine 40 MG tablet Commonly known as: PEPCID TAKE ONE TABLET BY MOUTH TWICE DAILY   furosemide 20 MG tablet Commonly known as: LASIX TAKE ONE TABLET BY MOUTH ONCE DAILY   gabapentin 300 MG capsule Commonly known as: NEURONTIN TAKE ONE CAPSULE BY MOUTH THREE TIMES DAILY   hydrALAZINE 50 MG tablet Commonly known as: APRESOLINE TAKE ONE TABLET BY MOUTH THREE TIMES DAILY   latanoprost 0.005 % ophthalmic solution Commonly known as: XALATAN 1 drop at bedtime.   leflunomide 20 MG tablet Commonly known as: ARAVA Take 20 mg by mouth daily.   levothyroxine 175 MCG tablet Commonly known as: SYNTHROID Take 1 tablet (175 mcg total) by mouth daily.   MELATONIN GUMMIES PO Take by mouth at bedtime as needed.   multivitamin with minerals Tabs tablet Take 1 tablet by mouth daily.   spironolactone 25 MG tablet Commonly known as: ALDACTONE TAKE ONE TABLET BY MOUTH ONCE DAILY   telmisartan 40 MG tablet Commonly known as: MICARDIS TAKE ONE TABLET BY MOUTH ONCE DAILY   triamcinolone ointment 0.5 %  Commonly known as: KENALOG APPLY TO THE AFFECTED AREA TWICE DAILY USE FOR UPTO 14 DAYS   vitamin E 180 MG (400 UNITS) capsule Take 400 Units by mouth daily.        Allergies:  Allergies  Allergen Reactions   Penicillins Other (See Comments)    Intolerance or allergy not remembered; her sister stated penicillin caused "boils under the skin"   Shellfish Allergy Rash   Shellfish-Derived Products Anaphylaxis   Codeine Nausea Only    Nausea only with liquid codeine   Infliximab Other (See Comments)     Confusion (intolerance) Knots under the skin   Sulfonamide Derivatives Rash    Joint pain   Azithromycin Itching    Iodine Other (See Comments)    UNKNOWN REACTION    Past Medical History, Surgical history, Social history, and Family History were reviewed and updated.  Review of Systems: All other 10 point review of systems is negative.   Physical Exam:  vitals were not taken for this visit.   Wt Readings from Last 3 Encounters:  03/08/22 204 lb (92.5 kg)  02/23/22 205 lb 9.6 oz (93.3 kg)  02/08/22 204 lb 12.8 oz (92.9 kg)    Ocular: Sclerae unicteric, pupils equal, round and reactive to light Ear-nose-throat: Oropharynx clear, dentition fair Lymphatic: No cervical or supraclavicular adenopathy Lungs no rales or rhonchi, good excursion bilaterally Heart regular rate and rhythm, no murmur appreciated Abd soft, nontender, positive bowel sounds MSK no focal spinal tenderness, no joint edema Neuro: non-focal, well-oriented, appropriate affect Breasts: Deferred   Lab Results  Component Value Date   WBC 4.9 05/19/2022   HGB 10.2 (L) 05/19/2022   HCT 32.2 (L) 05/19/2022   MCV 100.6 (H) 05/19/2022   PLT 162 05/19/2022   Lab Results  Component Value Date   FERRITIN 152 04/07/2022   IRON 130 04/07/2022   TIBC 337 04/07/2022   UIBC 207 04/07/2022   IRONPCTSAT 39 (H) 04/07/2022   Lab Results  Component Value Date   RETICCTPCT 1.2 05/19/2022   RBC 3.15 (L) 05/19/2022   RBC 3.20 (L) 05/19/2022   RETICCTABS 51.6 01/03/2013   Lab Results  Component Value Date   KPAFRELGTCHN 35.5 (H) 04/07/2022   LAMBDASER 46.1 (H) 04/07/2022   KAPLAMBRATIO 0.77 04/07/2022   Lab Results  Component Value Date   IGGSERUM 1,044 04/07/2022   IGA 577 (H) 04/07/2022   IGMSERUM 25 (L) 04/07/2022   Lab Results  Component Value Date   TOTALPROTELP 7.2 04/07/2022   ALBUMINELP 4.0 04/07/2022   A1GS 0.2 04/07/2022   A2GS 0.7 04/07/2022   BETS 1.4 (H) 04/07/2022   BETA2SER 10.3 (H) 11/04/2011   GAMS 0.9 04/07/2022   MSPIKE 0.3 (H) 04/07/2022   SPEI Comment 04/07/2022     Chemistry      Component  Value Date/Time   NA 140 04/07/2022 1247   NA 150 (H) 03/01/2017 1308   NA 143 04/05/2016 1156   K 4.6 04/07/2022 1247   K 4.0 03/01/2017 1308   K 3.8 04/05/2016 1156   CL 106 04/07/2022 1247   CL 109 (H) 03/01/2017 1308   CO2 23 04/07/2022 1247   CO2 28 03/01/2017 1308   CO2 21 (L) 04/05/2016 1156   BUN 64 (H) 04/07/2022 1247   BUN 19 03/01/2017 1308   BUN 18.5 04/05/2016 1156   CREATININE 2.71 (H) 04/07/2022 1247   CREATININE 1.8 (H) 03/01/2017 1308   CREATININE 1.5 (H) 04/05/2016 1156      Component  Value Date/Time   CALCIUM 9.3 04/07/2022 1247   CALCIUM 9.7 03/01/2017 1308   CALCIUM 9.3 04/05/2016 1156   ALKPHOS 73 04/07/2022 1247   ALKPHOS 55 03/01/2017 1308   ALKPHOS 77 04/05/2016 1156   AST 18 04/07/2022 1247   AST 21 04/05/2016 1156   ALT 16 04/07/2022 1247   ALT 18 03/01/2017 1308   ALT 22 04/05/2016 1156   BILITOT 0.3 04/07/2022 1247   BILITOT 0.36 04/05/2016 1156       Impression and Plan: Ms. Hunsucker is a very pleasant 77 yo African American female with multifactorial anemia as well as an IgA lambda MGUS.  ESA given, Hgb 10.2.  Iron studies pending.  Lab check and injection monthly, follow-up in 3 months.   [name], NP 1/3/20241:00 PM

## 2022-05-20 LAB — IRON AND IRON BINDING CAPACITY (CC-WL,HP ONLY)
Iron: 173 ug/dL — ABNORMAL HIGH (ref 28–170)
Saturation Ratios: 58 % — ABNORMAL HIGH (ref 10.4–31.8)
TIBC: 298 ug/dL (ref 250–450)
UIBC: 125 ug/dL — ABNORMAL LOW (ref 148–442)

## 2022-05-21 ENCOUNTER — Other Ambulatory Visit: Payer: Self-pay | Admitting: Internal Medicine

## 2022-06-22 ENCOUNTER — Telehealth: Payer: Self-pay

## 2022-06-22 ENCOUNTER — Inpatient Hospital Stay: Payer: Medicare Other

## 2022-06-22 ENCOUNTER — Inpatient Hospital Stay: Payer: Medicare Other | Attending: Hematology & Oncology

## 2022-06-22 DIAGNOSIS — I129 Hypertensive chronic kidney disease with stage 1 through stage 4 chronic kidney disease, or unspecified chronic kidney disease: Secondary | ICD-10-CM | POA: Insufficient documentation

## 2022-06-22 DIAGNOSIS — D508 Other iron deficiency anemias: Secondary | ICD-10-CM

## 2022-06-22 DIAGNOSIS — N189 Chronic kidney disease, unspecified: Secondary | ICD-10-CM | POA: Diagnosis not present

## 2022-06-22 DIAGNOSIS — D631 Anemia in chronic kidney disease: Secondary | ICD-10-CM | POA: Insufficient documentation

## 2022-06-22 DIAGNOSIS — Z87891 Personal history of nicotine dependence: Secondary | ICD-10-CM | POA: Insufficient documentation

## 2022-06-22 DIAGNOSIS — N181 Chronic kidney disease, stage 1: Secondary | ICD-10-CM

## 2022-06-22 LAB — CMP (CANCER CENTER ONLY)
ALT: 9 U/L (ref 0–44)
AST: 15 U/L (ref 15–41)
Albumin: 4.6 g/dL (ref 3.5–5.0)
Alkaline Phosphatase: 66 U/L (ref 38–126)
Anion gap: 9 (ref 5–15)
BUN: 52 mg/dL — ABNORMAL HIGH (ref 8–23)
CO2: 23 mmol/L (ref 22–32)
Calcium: 9.8 mg/dL (ref 8.9–10.3)
Chloride: 111 mmol/L (ref 98–111)
Creatinine: 2.13 mg/dL — ABNORMAL HIGH (ref 0.44–1.00)
GFR, Estimated: 24 mL/min — ABNORMAL LOW (ref >60)
Glucose, Bld: 117 mg/dL — ABNORMAL HIGH (ref 70–99)
Potassium: 4.9 mmol/L (ref 3.5–5.1)
Sodium: 143 mmol/L (ref 135–145)
Total Bilirubin: 0.3 mg/dL (ref 0.3–1.2)
Total Protein: 8 g/dL (ref 6.5–8.1)

## 2022-06-22 LAB — CBC WITH DIFFERENTIAL (CANCER CENTER ONLY)
Abs Immature Granulocytes: 0.01 10*3/uL (ref 0.00–0.07)
Basophils Absolute: 0.1 10*3/uL (ref 0.0–0.1)
Basophils Relative: 1 %
Eosinophils Absolute: 0.2 10*3/uL (ref 0.0–0.5)
Eosinophils Relative: 4 %
HCT: 35.8 % — ABNORMAL LOW (ref 36.0–46.0)
Hemoglobin: 11.2 g/dL — ABNORMAL LOW (ref 12.0–15.0)
Immature Granulocytes: 0 %
Lymphocytes Relative: 51 %
Lymphs Abs: 2.2 10*3/uL (ref 0.7–4.0)
MCH: 32.2 pg (ref 26.0–34.0)
MCHC: 31.3 g/dL (ref 30.0–36.0)
MCV: 102.9 fL — ABNORMAL HIGH (ref 80.0–100.0)
Monocytes Absolute: 0.6 10*3/uL (ref 0.1–1.0)
Monocytes Relative: 13 %
Neutro Abs: 1.3 10*3/uL — ABNORMAL LOW (ref 1.7–7.7)
Neutrophils Relative %: 31 %
Platelet Count: 170 10*3/uL (ref 150–400)
RBC: 3.48 MIL/uL — ABNORMAL LOW (ref 3.87–5.11)
RDW: 14 % (ref 11.5–15.5)
WBC Count: 4.3 10*3/uL (ref 4.0–10.5)
nRBC: 0 % (ref 0.0–0.2)

## 2022-06-22 NOTE — Telephone Encounter (Signed)
Reviewed labs with patient who did not meet injection requirements as hemoglobin is 11.2 Pt states she is feeling fine and happy to not need injection. Pt requesting to transfer care to [name] at [facility] due to transportation issues.  Message sent to referral coordinator to start process of transferring care.

## 2022-06-22 NOTE — Progress Notes (Unsigned)
Reviewed labs with patient and hemoglobin 11.2 and patient does not meet requirements for injection today. Pt states she feels great and happy to not need an injection. Pt states she would like to transfer her care to [name] at [facility]. Message sent to referral coordinator to begin process of transferring care. Pt states she loves this office but due to transportation issues [facility] is easier to get to appointments.

## 2022-06-23 ENCOUNTER — Telehealth: Payer: Self-pay | Admitting: *Deleted

## 2022-06-23 NOTE — Telephone Encounter (Signed)
Sent referral for transfer of patient care to [facility]. Patient is requesting provider to be Dr. Narda Rutherford. Reason - transportation issues (closer to home)

## 2022-06-24 ENCOUNTER — Telehealth: Payer: Self-pay | Admitting: Hematology and Oncology

## 2022-06-24 NOTE — Telephone Encounter (Signed)
Scheduled appt per 2/6 referral. Pt is aware of appt date and time. Pt is aware to arrive 15 mins prior to appt time and to bring and updated insurance card. Pt is aware of appt location.

## 2022-07-08 ENCOUNTER — Inpatient Hospital Stay (HOSPITAL_BASED_OUTPATIENT_CLINIC_OR_DEPARTMENT_OTHER): Payer: Medicare Other | Admitting: Hematology and Oncology

## 2022-07-08 ENCOUNTER — Inpatient Hospital Stay: Payer: Medicare Other

## 2022-07-08 ENCOUNTER — Other Ambulatory Visit: Payer: Self-pay

## 2022-07-08 VITALS — BP 123/64 | HR 94 | Temp 97.8°F | Resp 17 | Wt 203.4 lb

## 2022-07-08 DIAGNOSIS — D631 Anemia in chronic kidney disease: Secondary | ICD-10-CM

## 2022-07-08 DIAGNOSIS — N189 Chronic kidney disease, unspecified: Secondary | ICD-10-CM | POA: Diagnosis not present

## 2022-07-08 DIAGNOSIS — I129 Hypertensive chronic kidney disease with stage 1 through stage 4 chronic kidney disease, or unspecified chronic kidney disease: Secondary | ICD-10-CM | POA: Diagnosis not present

## 2022-07-08 DIAGNOSIS — Z87891 Personal history of nicotine dependence: Secondary | ICD-10-CM | POA: Diagnosis not present

## 2022-07-08 LAB — CMP (CANCER CENTER ONLY)
ALT: 14 U/L (ref 0–44)
AST: 18 U/L (ref 15–41)
Albumin: 4.4 g/dL (ref 3.5–5.0)
Alkaline Phosphatase: 70 U/L (ref 38–126)
Anion gap: 8 (ref 5–15)
BUN: 39 mg/dL — ABNORMAL HIGH (ref 8–23)
CO2: 25 mmol/L (ref 22–32)
Calcium: 9.1 mg/dL (ref 8.9–10.3)
Chloride: 108 mmol/L (ref 98–111)
Creatinine: 2.16 mg/dL — ABNORMAL HIGH (ref 0.44–1.00)
GFR, Estimated: 23 mL/min — ABNORMAL LOW (ref >60)
Glucose, Bld: 114 mg/dL — ABNORMAL HIGH (ref 70–99)
Potassium: 4.3 mmol/L (ref 3.5–5.1)
Sodium: 141 mmol/L (ref 135–145)
Total Bilirubin: 0.4 mg/dL (ref 0.3–1.2)
Total Protein: 7.7 g/dL (ref 6.5–8.1)

## 2022-07-08 LAB — CBC WITH DIFFERENTIAL (CANCER CENTER ONLY)
Abs Immature Granulocytes: 0 10*3/uL (ref 0.00–0.07)
Basophils Absolute: 0 10*3/uL (ref 0.0–0.1)
Basophils Relative: 1 %
Eosinophils Absolute: 0.2 10*3/uL (ref 0.0–0.5)
Eosinophils Relative: 4 %
HCT: 33.5 % — ABNORMAL LOW (ref 36.0–46.0)
Hemoglobin: 11.1 g/dL — ABNORMAL LOW (ref 12.0–15.0)
Immature Granulocytes: 0 %
Lymphocytes Relative: 43 %
Lymphs Abs: 2 10*3/uL (ref 0.7–4.0)
MCH: 32.5 pg (ref 26.0–34.0)
MCHC: 33.1 g/dL (ref 30.0–36.0)
MCV: 98 fL (ref 80.0–100.0)
Monocytes Absolute: 0.5 10*3/uL (ref 0.1–1.0)
Monocytes Relative: 12 %
Neutro Abs: 1.8 10*3/uL (ref 1.7–7.7)
Neutrophils Relative %: 40 %
Platelet Count: 187 10*3/uL (ref 150–400)
RBC: 3.42 MIL/uL — ABNORMAL LOW (ref 3.87–5.11)
RDW: 13.8 % (ref 11.5–15.5)
WBC Count: 4.5 10*3/uL (ref 4.0–10.5)
nRBC: 0 % (ref 0.0–0.2)

## 2022-07-08 LAB — RETIC PANEL
Immature Retic Fract: 12.7 % (ref 2.3–15.9)
RBC.: 3.37 MIL/uL — ABNORMAL LOW (ref 3.87–5.11)
Retic Count, Absolute: 27.6 10*3/uL (ref 19.0–186.0)
Retic Ct Pct: 0.8 % (ref 0.4–3.1)
Reticulocyte Hemoglobin: 34.4 pg (ref >27.9)

## 2022-07-08 LAB — LACTATE DEHYDROGENASE: LDH: 177 U/L (ref 98–192)

## 2022-07-08 LAB — VITAMIN B12: Vitamin B-12: 830 pg/mL (ref 180–914)

## 2022-07-08 LAB — FOLATE: Folate: >40 ng/mL (ref >5.9)

## 2022-07-08 NOTE — Progress Notes (Signed)
[place] [phone]   Fax:([phone]) [place] NOTE  Patient Care Team: Binnie Rail, MD as PCP - General (Internal Medicine) Charlton Haws, [facility] as Pharmacist (Pharmacist) Camillo Flaming, OD as Referring Physician (Optometry)  Hematological/Oncological History # Anemia in the Setting of Chronic Kidney Disease  05/19/2022: last visit with Tina Molina 07/08/2022: establish care with Dr. Lorenso Molina   CHIEF COMPLAINTS/PURPOSE OF CONSULTATION:  "Anemia 2/2 to CKD "  HISTORY OF PRESENTING ILLNESS:  Tina Molina 77 y.o. female with medical history significant for anemia secondary to CKD who presents for transferring care from [facility].  On review of the previous records Tina Molina was previously cared for by Tina Molina and last saw her on 05/19/2022.  The patient receives Aranesp 300 mcg every 4 weeks in order to help bolster her hemoglobin levels.  Her target hemoglobin has been greater than 11.0.  The patient has been tolerating this treatment well.  Due to wanting to receive treatment closer to home she is transferring to the [place].  On exam today Tina Molina reports that her neck shot is due on 07/20/2022.  She notes that despite the shots she is "tired all the time".  She currently ranks her energy is about a 3 out of 10.  She reports that the shots unfortunately do not help with this.  She notes that she does not have any difficulties with the shots such as pain, swelling, or redness.  She reports that she eats a relatively good diet but does not eat shellfish as it can cause a rash.  She eats red meat approximate 1-2 times per week.  She does not have any issues with bleeding, bruising, or dark stools.  She reports she last received a colonoscopy on 04/05/2020 with [facility].  She notes that she has not been having any issues with fevers, chills, sweats, nausea, vomiting or diarrhea.  She follows with Dr. Hollie Salk and  nephrology.  A full 10 point ROS is otherwise negative.  MEDICAL HISTORY:  Past Medical History:  Diagnosis Date   Anemia    Associated with leukopenia and lymphocytosis. This is been evaluated by Dr. Marin Olp . Her platelet count has been normal   Arthritis    Chronic back pain    Chronic kidney disease    Erythropoietin deficiency anemia 02/23/2016   GERD (gastroesophageal reflux disease)    Hypertension    Lymphocytosis    MGUS (monoclonal gammopathy of unknown significance) 08/03/2012   IgA 792 11/04/11   Pneumonia 04/2010   OP   Renal insufficiency    Sarcoidosis    Thyroid disease     SURGICAL HISTORY: Past Surgical History:  Procedure Laterality Date   CARPAL TUNNEL RELEASE     Bilateral    CATARACT EXTRACTION Right 05/2015   CATARACT EXTRACTION Left    CERVICAL DISCECTOMY  06/2010   Dr.Pool   CHOLECYSTECTOMY     COLONOSCOPY  2010   POSTERIOR LUMBAR FUSION  02/29/2012   Dr Alyson Locket op respiratory & renal compromise   THYROIDECTOMY  2003   For goiter   UPPER GASTROINTESTINAL ENDOSCOPY  12/01/2011   DB    SOCIAL HISTORY: Social History   Socioeconomic History   Marital status: Divorced    Spouse name: Not on file   Number of children: 0   Years of education: 12   Highest education level: Not on file  Occupational History   Occupation: Retired  Employer: RETIRED  Tobacco Use   Smoking status: Former    Packs/day: 0.50    Years: 20.00    Total pack years: 10.00    Types: Cigarettes    Quit date: 05/17/1997    Years since quitting: 25.1   Smokeless tobacco: Never  Vaping Use   Vaping Use: Never used  Substance and Sexual Activity   Alcohol use: Yes    Alcohol/week: 1.0 standard drink of alcohol    Types: 1 Glasses of wine per week    Comment: occasionally , less than weekly   Drug use: No   Sexual activity: Not Currently  Other Topics Concern   Not on file  Social History Narrative   Patient is divorced and lives with her sister. Patient is  retired.   Caffeine- sometimes - one cup   Right handed.   Social Determinants of Health   Financial Resource Strain: Low Risk  (08/18/2021)   Overall Financial Resource Strain (CARDIA)    Difficulty of Paying Living Expenses: Not hard at all  Food Insecurity: No Food Insecurity (08/18/2021)   Hunger Vital Sign    Worried About Running Out of Food in the Last Year: Never true    Ran Out of Food in the Last Year: Never true  Transportation Needs: No Transportation Needs (08/18/2021)   PRAPARE - Hydrologist (Medical): No    Lack of Transportation (Non-Medical): No  Physical Activity: Inactive (08/18/2021)   Exercise Vital Sign    Days of Exercise per Week: 0 days    Minutes of Exercise per Session: 0 min  Stress: No Stress Concern Present (08/18/2021)   [place]    Feeling of Stress : Not at all  Social Connections: Moderately Integrated (08/18/2021)   Social Connection and Isolation Panel [NHANES]    Frequency of Communication with Friends and Family: More than three times a week    Frequency of Social Gatherings with Friends and Family: More than three times a week    Attends Religious Services: More than 4 times per year    Active Member of [employer] or Organizations: Yes    Attends Music therapist: More than 4 times per year    Marital Status: Divorced  Intimate Partner Violence: Not At Risk (08/18/2021)   Humiliation, Afraid, Rape, and Kick questionnaire    Fear of Current or Ex-Partner: No    Emotionally Abused: No    Physically Abused: No    Sexually Abused: No    FAMILY HISTORY: Family History  Problem Relation Age of Onset   Anemia Father    Arthritis Father    Heart failure Father 36       No CAD   Diabetes Mother    Arthritis Mother    Glaucoma Mother    Heart disease Mother 4       No CAD   Hypertension Brother    Anemia Brother    Kidney disease Brother     Hypertension Sister    Kidney disease Sister    Anemia Sister    Diabetes Maternal Aunt    Arthritis Paternal Grandmother    Colon cancer Neg Hx    Colon polyps Neg Hx    Esophageal cancer Neg Hx    Rectal cancer Neg Hx    Stomach cancer Neg Hx     ALLERGIES:  is allergic to penicillins, shellfish allergy, shellfish-derived products, codeine, infliximab, sulfonamide  derivatives, azithromycin, and iodine.  MEDICATIONS:  Current Outpatient Medications  Medication Sig Dispense Refill   aspirin 81 MG tablet Take 81 mg by mouth daily.     atorvastatin (LIPITOR) 20 MG tablet TAKE ONE TABLET BY MOUTH ONCE DAILY 90 tablet 1   budesonide-formoterol (SYMBICORT) 160-4.5 MCG/ACT inhaler inhale 2 puffs if needed for wheezing 10.2 g 0   carvedilol (COREG) 25 MG tablet TAKE ONE TABLET BY MOUTH TWICE DAILY 180 tablet 1   cyanocobalamin 100 MCG tablet Take 100 mcg by mouth daily.     cyclobenzaprine (FLEXERIL) 10 MG tablet TAKE ONE TABLET BY MOUTH THREE TIMES DAILY 270 tablet 1   famotidine (PEPCID) 40 MG tablet TAKE ONE TABLET BY MOUTH TWICE DAILY 180 tablet 2   furosemide (LASIX) 20 MG tablet TAKE ONE TABLET BY MOUTH ONCE DAILY 90 tablet 2   gabapentin (NEURONTIN) 300 MG capsule TAKE ONE CAPSULE BY MOUTH THREE TIMES DAILY 270 capsule 1   hydrALAZINE (APRESOLINE) 50 MG tablet TAKE ONE TABLET BY MOUTH THREE TIMES DAILY 270 tablet 1   latanoprost (XALATAN) 0.005 % ophthalmic solution 1 drop at bedtime.     leflunomide (ARAVA) 20 MG tablet Take 20 mg by mouth daily.     levothyroxine (SYNTHROID) 175 MCG tablet Take 1 tablet (175 mcg total) by mouth daily. 90 tablet 3   MELATONIN GUMMIES PO Take by mouth at bedtime as needed.     Multiple Vitamin (MULTIVITAMIN WITH MINERALS) TABS Take 1 tablet by mouth daily.     spironolactone (ALDACTONE) 25 MG tablet TAKE ONE TABLET BY MOUTH ONCE DAILY 90 tablet 2   telmisartan (MICARDIS) 40 MG tablet TAKE ONE TABLET BY MOUTH ONCE DAILY 90 tablet 1   triamcinolone  ointment (KENALOG) 0.5 % APPLY TO THE AFFECTED AREA TWICE DAILY USE FOR UPTO 14 DAYS 15 g 0   vitamin E 400 UNIT capsule Take 400 Units by mouth daily.      No current facility-administered medications for this visit.    REVIEW OF SYSTEMS:   Constitutional: ( - ) fevers, ( - )  chills , ( - ) night sweats Eyes: ( - ) blurriness of vision, ( - ) double vision, ( - ) watery eyes Ears, nose, mouth, throat, and face: ( - ) mucositis, ( - ) sore throat Respiratory: ( - ) cough, ( - ) dyspnea, ( - ) wheezes Cardiovascular: ( - ) palpitation, ( - ) chest discomfort, ( - ) lower extremity swelling Gastrointestinal:  ( - ) nausea, ( - ) heartburn, ( - ) change in bowel habits Skin: ( - ) abnormal skin rashes Lymphatics: ( - ) new lymphadenopathy, ( - ) easy bruising Neurological: ( - ) numbness, ( - ) tingling, ( - ) new weaknesses Behavioral/Psych: ( - ) mood change, ( - ) new changes  All other systems were reviewed with the patient and are negative.  PHYSICAL EXAMINATION:  Vitals:   07/08/22 1338  BP: 123/64  Pulse: 94  Resp: 17  Temp: 97.8 F (36.6 C)  SpO2: 99%   Filed Weights   07/08/22 1338  Weight: 203 lb 6.4 oz (92.3 kg)    GENERAL: well appearing elderly African-American female in NAD  SKIN: skin color, texture, turgor are normal, no rashes or significant lesions EYES: conjunctiva are pink and non-injected, sclera clear LUNGS: clear to auscultation and percussion with normal breathing effort HEART: regular rate & rhythm and no murmurs and no lower extremity edema Musculoskeletal: no cyanosis  of digits and no clubbing  PSYCH: alert & oriented x 3, fluent speech NEURO: no focal motor/sensory deficits  LABORATORY DATA:  I have reviewed the data as listed    Latest Ref Rng & Units 07/08/2022    2:16 PM 06/22/2022    1:36 PM 05/19/2022   12:43 PM  CBC  WBC 4.0 - 10.5 K/uL 4.5  4.3  4.9   Hemoglobin 12.0 - 15.0 g/dL 11.1  11.2  10.2   Hematocrit 36.0 - 46.0 % 33.5  35.8   32.2   Platelets 150 - 400 K/uL 187  170  162        Latest Ref Rng & Units 07/08/2022    2:16 PM 06/22/2022    1:36 PM 05/19/2022   12:43 PM  CMP  Glucose 70 - 99 mg/dL 114  117  115   BUN 8 - 23 mg/dL 39  52  62   Creatinine 0.44 - 1.00 mg/dL 2.16  2.13  2.52   Sodium 135 - 145 mmol/L 141  143  143   Potassium 3.5 - 5.1 mmol/L 4.3  4.9  4.6   Chloride 98 - 111 mmol/L 108  111  110   CO2 22 - 32 mmol/L 25  23  23   $ Calcium 8.9 - 10.3 mg/dL 9.1  9.8  9.2   Total Protein 6.5 - 8.1 g/dL 7.7  8.0  7.9   Total Bilirubin 0.3 - 1.2 mg/dL 0.4  0.3  0.5   Alkaline Phos 38 - 126 U/L 70  66  66   AST 15 - 41 U/L 18  15  14   $ ALT 0 - 44 U/L 14  9  11      $ ASSESSMENT & PLAN Tina Molina 77 y.o. female with medical history significant for anemia secondary to CKD who presents for transferring care from [facility].  After review of the labs, review of the records, and discussion with the patient the patients findings are most consistent with anemia secondary to chronic kidney disease.  # Anemia in the Setting of Chronic Kidney Disease  --currently managed on Aranesp 300 mcg SQ q 14 days to maintain Hgb >10 -- White blood cell 4.5, hemoglobin 11.1, MCV 98, and platelets of 187 --Will order additional labs to include vitamin B12 levels, iron levels, erythropoietin levels --RTC in 2 months with q 4 week labs and injection   Orders Placed This Encounter  Procedures   CBC with Differential ([place] Only)    Standing Status:   Future    Number of Occurrences:   1    Standing Expiration Date:   07/09/2023   CMP ([place] only)    Standing Status:   Future    Number of Occurrences:   1    Standing Expiration Date:   07/09/2023   Vitamin B12    Standing Status:   Future    Number of Occurrences:   1    Standing Expiration Date:   07/08/2023   Folate, Serum    Standing Status:   Future    Number of Occurrences:   1    Standing Expiration Date:   07/08/2023   Lactate dehydrogenase  (LDH)    Standing Status:   Future    Number of Occurrences:   1    Standing Expiration Date:   07/08/2023   Retic Panel    Standing Status:   Future    Number of Occurrences:  1    Standing Expiration Date:   07/09/2023   Erythropoietin    Standing Status:   Future    Number of Occurrences:   1    Standing Expiration Date:   07/08/2023    All questions were answered. The patient knows to call the clinic with any problems, questions or concerns.  A total of more than 40 minutes were spent on this encounter with face-to-face time and non-face-to-face time, including preparing to see the patient, ordering tests and/or medications, counseling the patient and coordination of care as outlined above.   Ledell Peoples, MD Department of Hematology/Oncology [employer] [facility] Phone: [phone] Pager: [phone] Email: [email or web address]  07/08/2022 5:55 PM

## 2022-07-09 ENCOUNTER — Telehealth: Payer: Self-pay | Admitting: Hematology and Oncology

## 2022-07-09 LAB — ERYTHROPOIETIN: Erythropoietin: 15.1 m[IU]/mL (ref 2.6–18.5)

## 2022-07-09 NOTE — Telephone Encounter (Signed)
Called patient per 2/22 los notes. Left patient voicemail with new appointment information and contact details if needing to reschedule.

## 2022-07-19 ENCOUNTER — Telehealth: Payer: Self-pay | Admitting: *Deleted

## 2022-07-19 NOTE — Telephone Encounter (Signed)
Patient, called and requested all appointments canceled and said that she was going to [place].

## 2022-07-20 ENCOUNTER — Inpatient Hospital Stay: Payer: Medicare Other

## 2022-07-22 ENCOUNTER — Inpatient Hospital Stay: Payer: Medicare Other | Attending: Hematology & Oncology

## 2022-07-22 ENCOUNTER — Inpatient Hospital Stay: Payer: Medicare Other

## 2022-07-22 ENCOUNTER — Other Ambulatory Visit: Payer: Self-pay

## 2022-07-22 VITALS — BP 111/59 | HR 90 | Temp 97.9°F | Resp 19 | Ht 64.0 in

## 2022-07-22 DIAGNOSIS — D631 Anemia in chronic kidney disease: Secondary | ICD-10-CM | POA: Diagnosis not present

## 2022-07-22 DIAGNOSIS — N182 Chronic kidney disease, stage 2 (mild): Secondary | ICD-10-CM | POA: Insufficient documentation

## 2022-07-22 DIAGNOSIS — D508 Other iron deficiency anemias: Secondary | ICD-10-CM

## 2022-07-22 DIAGNOSIS — I129 Hypertensive chronic kidney disease with stage 1 through stage 4 chronic kidney disease, or unspecified chronic kidney disease: Secondary | ICD-10-CM | POA: Insufficient documentation

## 2022-07-22 LAB — CBC WITH DIFFERENTIAL (CANCER CENTER ONLY)
Abs Immature Granulocytes: 0 10*3/uL (ref 0.00–0.07)
Basophils Absolute: 0 10*3/uL (ref 0.0–0.1)
Basophils Relative: 1 %
Eosinophils Absolute: 0.1 10*3/uL (ref 0.0–0.5)
Eosinophils Relative: 4 %
HCT: 28.5 % — ABNORMAL LOW (ref 36.0–46.0)
Hemoglobin: 9.7 g/dL — ABNORMAL LOW (ref 12.0–15.0)
Immature Granulocytes: 0 %
Lymphocytes Relative: 58 %
Lymphs Abs: 2.4 10*3/uL (ref 0.7–4.0)
MCH: 32.9 pg (ref 26.0–34.0)
MCHC: 34 g/dL (ref 30.0–36.0)
MCV: 96.6 fL (ref 80.0–100.0)
Monocytes Absolute: 0.5 10*3/uL (ref 0.1–1.0)
Monocytes Relative: 13 %
Neutro Abs: 1 10*3/uL — ABNORMAL LOW (ref 1.7–7.7)
Neutrophils Relative %: 24 %
Platelet Count: 169 10*3/uL (ref 150–400)
RBC: 2.95 MIL/uL — ABNORMAL LOW (ref 3.87–5.11)
RDW: 14 % (ref 11.5–15.5)
WBC Count: 4 10*3/uL (ref 4.0–10.5)
nRBC: 0 % (ref 0.0–0.2)

## 2022-07-22 LAB — FERRITIN: Ferritin: 153 ng/mL (ref 11–307)

## 2022-07-22 LAB — CMP (CANCER CENTER ONLY)
ALT: 11 U/L (ref 0–44)
AST: 14 U/L — ABNORMAL LOW (ref 15–41)
Albumin: 4.1 g/dL (ref 3.5–5.0)
Alkaline Phosphatase: 64 U/L (ref 38–126)
Anion gap: 7 (ref 5–15)
BUN: 44 mg/dL — ABNORMAL HIGH (ref 8–23)
CO2: 24 mmol/L (ref 22–32)
Calcium: 8.8 mg/dL — ABNORMAL LOW (ref 8.9–10.3)
Chloride: 111 mmol/L (ref 98–111)
Creatinine: 2.35 mg/dL — ABNORMAL HIGH (ref 0.44–1.00)
GFR, Estimated: 21 mL/min — ABNORMAL LOW (ref >60)
Glucose, Bld: 102 mg/dL — ABNORMAL HIGH (ref 70–99)
Potassium: 4.1 mmol/L (ref 3.5–5.1)
Sodium: 142 mmol/L (ref 135–145)
Total Bilirubin: 0.4 mg/dL (ref 0.3–1.2)
Total Protein: 7.1 g/dL (ref 6.5–8.1)

## 2022-07-22 LAB — RETICULOCYTES
Immature Retic Fract: 9.7 % (ref 2.3–15.9)
RBC.: 3.02 MIL/uL — ABNORMAL LOW (ref 3.87–5.11)
Retic Count, Absolute: 36.8 10*3/uL (ref 19.0–186.0)
Retic Ct Pct: 1.2 % (ref 0.4–3.1)

## 2022-07-22 LAB — IRON AND IRON BINDING CAPACITY (CC-WL,HP ONLY)
Iron: 141 ug/dL (ref 28–170)
Saturation Ratios: 49 % — ABNORMAL HIGH (ref 10.4–31.8)
TIBC: 287 ug/dL (ref 250–450)
UIBC: 146 ug/dL — ABNORMAL LOW (ref 148–442)

## 2022-07-22 MED ORDER — DARBEPOETIN ALFA 300 MCG/0.6ML IJ SOSY
300.0000 ug | PREFILLED_SYRINGE | Freq: Once | INTRAMUSCULAR | Status: AC
  Administered 2022-07-22: 300 ug via SUBCUTANEOUS
  Filled 2022-07-22: qty 0.6

## 2022-07-29 DIAGNOSIS — M069 Rheumatoid arthritis, unspecified: Secondary | ICD-10-CM | POA: Diagnosis not present

## 2022-07-29 DIAGNOSIS — D472 Monoclonal gammopathy: Secondary | ICD-10-CM | POA: Diagnosis not present

## 2022-07-29 DIAGNOSIS — N390 Urinary tract infection, site not specified: Secondary | ICD-10-CM | POA: Diagnosis not present

## 2022-07-29 DIAGNOSIS — N1832 Chronic kidney disease, stage 3b: Secondary | ICD-10-CM | POA: Diagnosis not present

## 2022-07-29 DIAGNOSIS — D860 Sarcoidosis of lung: Secondary | ICD-10-CM | POA: Diagnosis not present

## 2022-07-29 DIAGNOSIS — I129 Hypertensive chronic kidney disease with stage 1 through stage 4 chronic kidney disease, or unspecified chronic kidney disease: Secondary | ICD-10-CM | POA: Diagnosis not present

## 2022-07-31 LAB — LAB REPORT - SCANNED
Albumin, Urine POC: 7.4
Creatinine, POC: 110.7 mg/dL
Microalb Creat Ratio: 133
Microalb Creat Ratio: 7

## 2022-08-03 DIAGNOSIS — Z961 Presence of intraocular lens: Secondary | ICD-10-CM | POA: Diagnosis not present

## 2022-08-03 DIAGNOSIS — H40053 Ocular hypertension, bilateral: Secondary | ICD-10-CM | POA: Diagnosis not present

## 2022-08-17 ENCOUNTER — Inpatient Hospital Stay: Payer: Medicare Other | Admitting: Family

## 2022-08-17 ENCOUNTER — Inpatient Hospital Stay: Payer: Medicare Other

## 2022-08-19 ENCOUNTER — Inpatient Hospital Stay: Payer: Medicare Other | Attending: Hematology & Oncology

## 2022-08-19 ENCOUNTER — Other Ambulatory Visit: Payer: Self-pay

## 2022-08-19 ENCOUNTER — Inpatient Hospital Stay: Payer: Medicare Other

## 2022-08-19 DIAGNOSIS — D472 Monoclonal gammopathy: Secondary | ICD-10-CM | POA: Diagnosis not present

## 2022-08-19 DIAGNOSIS — N184 Chronic kidney disease, stage 4 (severe): Secondary | ICD-10-CM | POA: Diagnosis present

## 2022-08-19 DIAGNOSIS — N182 Chronic kidney disease, stage 2 (mild): Secondary | ICD-10-CM | POA: Insufficient documentation

## 2022-08-19 DIAGNOSIS — D631 Anemia in chronic kidney disease: Secondary | ICD-10-CM | POA: Insufficient documentation

## 2022-08-19 DIAGNOSIS — D508 Other iron deficiency anemias: Secondary | ICD-10-CM

## 2022-08-19 DIAGNOSIS — I129 Hypertensive chronic kidney disease with stage 1 through stage 4 chronic kidney disease, or unspecified chronic kidney disease: Secondary | ICD-10-CM | POA: Diagnosis not present

## 2022-08-19 LAB — CBC WITH DIFFERENTIAL (CANCER CENTER ONLY)
Abs Immature Granulocytes: 0.01 10*3/uL (ref 0.00–0.07)
Basophils Absolute: 0 10*3/uL (ref 0.0–0.1)
Basophils Relative: 1 %
Eosinophils Absolute: 0.2 10*3/uL (ref 0.0–0.5)
Eosinophils Relative: 5 %
HCT: 34 % — ABNORMAL LOW (ref 36.0–46.0)
Hemoglobin: 10.9 g/dL — ABNORMAL LOW (ref 12.0–15.0)
Immature Granulocytes: 0 %
Lymphocytes Relative: 51 %
Lymphs Abs: 1.9 10*3/uL (ref 0.7–4.0)
MCH: 32.7 pg (ref 26.0–34.0)
MCHC: 32.1 g/dL (ref 30.0–36.0)
MCV: 102.1 fL — ABNORMAL HIGH (ref 80.0–100.0)
Monocytes Absolute: 0.5 10*3/uL (ref 0.1–1.0)
Monocytes Relative: 14 %
Neutro Abs: 1 10*3/uL — ABNORMAL LOW (ref 1.7–7.7)
Neutrophils Relative %: 29 %
Platelet Count: 181 10*3/uL (ref 150–400)
RBC: 3.33 MIL/uL — ABNORMAL LOW (ref 3.87–5.11)
RDW: 15.7 % — ABNORMAL HIGH (ref 11.5–15.5)
WBC Count: 3.6 10*3/uL — ABNORMAL LOW (ref 4.0–10.5)
nRBC: 0 % (ref 0.0–0.2)

## 2022-08-19 LAB — CMP (CANCER CENTER ONLY)
ALT: 14 U/L (ref 0–44)
AST: 17 U/L (ref 15–41)
Albumin: 4.2 g/dL (ref 3.5–5.0)
Alkaline Phosphatase: 62 U/L (ref 38–126)
Anion gap: 7 (ref 5–15)
BUN: 48 mg/dL — ABNORMAL HIGH (ref 8–23)
CO2: 27 mmol/L (ref 22–32)
Calcium: 9.4 mg/dL (ref 8.9–10.3)
Chloride: 108 mmol/L (ref 98–111)
Creatinine: 2.29 mg/dL — ABNORMAL HIGH (ref 0.44–1.00)
GFR, Estimated: 22 mL/min — ABNORMAL LOW (ref >60)
Glucose, Bld: 117 mg/dL — ABNORMAL HIGH (ref 70–99)
Potassium: 4.6 mmol/L (ref 3.5–5.1)
Sodium: 142 mmol/L (ref 135–145)
Total Bilirubin: 0.3 mg/dL (ref 0.3–1.2)
Total Protein: 7.3 g/dL (ref 6.5–8.1)

## 2022-08-19 NOTE — Progress Notes (Signed)
Pt. Here for Aranesp injection.  HGB is 10.9.  [name] made aware.  States ok to hold injection since HGB >10.  Pt. Given copy of results and made aware.

## 2022-08-21 ENCOUNTER — Other Ambulatory Visit: Payer: Self-pay | Admitting: Internal Medicine

## 2022-08-21 DIAGNOSIS — I10 Essential (primary) hypertension: Secondary | ICD-10-CM

## 2022-08-24 ENCOUNTER — Encounter: Payer: Self-pay | Admitting: Internal Medicine

## 2022-08-24 NOTE — Progress Notes (Unsigned)
Subjective:    Patient ID: Tina Molina, female    DOB: 09-30-1945, 77 y.o.   MRN: [id number]     HPI Tina Molina is here for follow up of her chronic medical problems.  She feels tired and SOB with walking short distances.    No regular exercise - she is trying to walk more.   She sleeps pretty good.  She sleeps during the day and is up all night.   Medications and allergies reviewed with patient and updated if appropriate.  Current Outpatient Medications on File Prior to Visit  Medication Sig Dispense Refill   aspirin 81 MG tablet Take 81 mg by mouth daily.     atorvastatin (LIPITOR) 20 MG tablet TAKE ONE TABLET BY MOUTH ONCE DAILY 90 tablet 1   budesonide-formoterol (SYMBICORT) 160-4.5 MCG/ACT inhaler inhale 2 puffs if needed for wheezing 10.2 g 0   carvedilol (COREG) 25 MG tablet TAKE ONE TABLET BY MOUTH TWICE DAILY 180 tablet 1   cyanocobalamin 100 MCG tablet Take 100 mcg by mouth daily.     cyclobenzaprine (FLEXERIL) 10 MG tablet TAKE ONE TABLET BY MOUTH THREE TIMES DAILY 270 tablet 1   famotidine (PEPCID) 40 MG tablet TAKE ONE TABLET BY MOUTH TWICE DAILY 180 tablet 2   furosemide (LASIX) 20 MG tablet TAKE ONE TABLET BY MOUTH ONCE DAILY 90 tablet 2   gabapentin (NEURONTIN) 300 MG capsule TAKE ONE CAPSULE BY MOUTH THREE TIMES DAILY 270 capsule 1   hydrALAZINE (APRESOLINE) 50 MG tablet TAKE ONE TABLET BY MOUTH THREE TIMES DAILY 270 tablet 1   latanoprost (XALATAN) 0.005 % ophthalmic solution 1 drop at bedtime.     leflunomide (ARAVA) 20 MG tablet Take 20 mg by mouth daily.     levothyroxine (SYNTHROID) 175 MCG tablet Take 1 tablet (175 mcg total) by mouth daily. 90 tablet 3   MELATONIN GUMMIES PO Take by mouth at bedtime as needed.     Multiple Vitamin (MULTIVITAMIN WITH MINERALS) TABS Take 1 tablet by mouth daily.     spironolactone (ALDACTONE) 25 MG tablet TAKE ONE TABLET BY MOUTH ONCE DAILY 90 tablet 2   telmisartan (MICARDIS) 40 MG tablet TAKE ONE TABLET BY MOUTH  ONCE DAILY 90 tablet 1   triamcinolone ointment (KENALOG) 0.5 % APPLY TO THE AFFECTED AREA TWICE DAILY USE FOR UPTO 14 DAYS 15 g 0   vitamin E 400 UNIT capsule Take 400 Units by mouth daily.      [DISCONTINUED] modafinil (PROVIGIL) 100 MG tablet Take 100 mg by mouth daily.     [DISCONTINUED] potassium chloride (MICRO-K) 10 MEQ CR capsule Take 1 capsule (10 mEq total) by mouth 2 (two) times daily. 180 capsule 1   No current facility-administered medications on file prior to visit.     Review of Systems  Constitutional:  Negative for fever.  Respiratory:  Positive for shortness of breath (walking moderate distances). Negative for cough and wheezing.   Cardiovascular:  Positive for chest pain (occ when overworking) and leg swelling. Negative for palpitations.  Neurological:  Positive for dizziness (when ear was stopped up). Negative for light-headedness and headaches.       Objective:   Vitals:   08/25/22 1316  BP: 136/70  Pulse: 97  Temp: 98.2 F (36.8 C)  SpO2: 100%   BP Readings from Last 3 Encounters:  08/25/22 136/70  07/22/22 (!) 111/59  07/08/22 123/64   Wt Readings from Last 3 Encounters:  08/25/22 205 lb (93 kg)  07/08/22 203 lb 6.4 oz (92.3 kg)  05/19/22 206 lb 12.8 oz (93.8 kg)   Body mass index is 35.19 kg/m.    Physical Exam Constitutional:      General: She is not in acute distress.    Appearance: Normal appearance.  HENT:     Head: Normocephalic and atraumatic.  Eyes:     Conjunctiva/sclera: Conjunctivae normal.  Cardiovascular:     Rate and Rhythm: Normal rate and regular rhythm.     Heart sounds: Normal heart sounds.  Pulmonary:     Effort: Pulmonary effort is normal. No respiratory distress.     Breath sounds: Normal breath sounds. No wheezing.  Musculoskeletal:     Cervical back: Neck supple.     Right lower leg: No edema.     Left lower leg: No edema.  Lymphadenopathy:     Cervical: No cervical adenopathy.  Skin:    General: Skin is warm  and dry.     Findings: No rash.  Neurological:     Mental Status: She is alert. Mental status is at baseline.  Psychiatric:        Mood and Affect: Mood normal.        Behavior: Behavior normal.        Lab Results  Component Value Date   WBC 3.6 (L) 08/19/2022   HGB 10.9 (L) 08/19/2022   HCT 34.0 (L) 08/19/2022   PLT 181 08/19/2022   GLUCOSE 117 (H) 08/19/2022   CHOL 184 02/23/2022   TRIG 129.0 02/23/2022   HDL 55.60 02/23/2022   LDLDIRECT 156.5 05/27/2011   LDLCALC 103 (H) 02/23/2022   ALT 14 08/19/2022   AST 17 08/19/2022   NA 142 08/19/2022   K 4.6 08/19/2022   CL 108 08/19/2022   CREATININE 2.29 (H) 08/19/2022   BUN 48 (H) 08/19/2022   CO2 27 08/19/2022   TSH 0.76 02/23/2022   HGBA1C 4.6 02/23/2022   MICROALBUR 0.50 12/15/2012     Assessment & Plan:    See Problem List for Assessment and Plan of chronic medical problems.

## 2022-08-24 NOTE — Patient Instructions (Addendum)
     An EKG was done today.  Your A1c was checked today.     Medications changes include :   none    A referral was ordered for cardiology.     Someone will call you to schedule an appointment.    Return in about 6 months (around 02/24/2023) for follow up.

## 2022-08-25 ENCOUNTER — Ambulatory Visit (INDEPENDENT_AMBULATORY_CARE_PROVIDER_SITE_OTHER): Payer: Medicare Other | Admitting: Internal Medicine

## 2022-08-25 VITALS — BP 136/70 | HR 97 | Temp 98.2°F | Ht 64.0 in | Wt 205.0 lb

## 2022-08-25 DIAGNOSIS — E7849 Other hyperlipidemia: Secondary | ICD-10-CM

## 2022-08-25 DIAGNOSIS — M48062 Spinal stenosis, lumbar region with neurogenic claudication: Secondary | ICD-10-CM | POA: Diagnosis not present

## 2022-08-25 DIAGNOSIS — R739 Hyperglycemia, unspecified: Secondary | ICD-10-CM

## 2022-08-25 DIAGNOSIS — N184 Chronic kidney disease, stage 4 (severe): Secondary | ICD-10-CM | POA: Diagnosis not present

## 2022-08-25 DIAGNOSIS — I10 Essential (primary) hypertension: Secondary | ICD-10-CM | POA: Diagnosis not present

## 2022-08-25 DIAGNOSIS — R0609 Other forms of dyspnea: Secondary | ICD-10-CM

## 2022-08-25 DIAGNOSIS — R600 Localized edema: Secondary | ICD-10-CM | POA: Diagnosis not present

## 2022-08-25 DIAGNOSIS — K219 Gastro-esophageal reflux disease without esophagitis: Secondary | ICD-10-CM

## 2022-08-25 DIAGNOSIS — E890 Postprocedural hypothyroidism: Secondary | ICD-10-CM

## 2022-08-25 LAB — POCT GLYCOSYLATED HEMOGLOBIN (HGB A1C)
HbA1c POC (<> result, manual entry): 4.9 % (ref 4.0–5.6)
HbA1c, POC (controlled diabetic range): 4.9 % (ref 0.0–7.0)
HbA1c, POC (prediabetic range): 4.9 % — AB (ref 5.7–6.4)
Hemoglobin A1C: 4.9 % (ref 4.0–5.6)

## 2022-08-25 MED ORDER — TELMISARTAN 20 MG PO TABS: 20.0000 mg | ORAL_TABLET | Freq: Every day | ORAL | 3 refills | Status: DC

## 2022-08-25 NOTE — Assessment & Plan Note (Addendum)
Chronic Regular exercise and healthy diet encouraged Continue atorvastatin 20 mg daily 

## 2022-08-25 NOTE — Assessment & Plan Note (Signed)
Chronic Blood pressure well controlled Continue carvedilol 25 mg twice daily, hydralazine 50 mg 3 times daily, spironolactone 25 mg daily, telmisartan 40 mg daily  EKG NSR at 83 bmp, normal EKG.  No change compared to prior EKG from 06/2012

## 2022-08-25 NOTE — Assessment & Plan Note (Signed)
Chronic leg edema intermittent Edema tends to get worse during the day-advised more compliance with low-sodium diet and elevating legs Continue furosemide 20 mg daily

## 2022-08-25 NOTE — Assessment & Plan Note (Addendum)
Chronic Has associated muscle spasms Limits her activity level Dr Jordan Likes  Taking flexeril 2-3 times daily Taking gabapentin 300 mg 2-3 times a day - when she takes the flexeril-discussed that pills of Flexeril and gabapentin could be causing some of the fatigue-discussed possibly decreasing the dose or holding them to see how much that helps

## 2022-08-25 NOTE — Assessment & Plan Note (Signed)
Chronic - getting worse Has many reasons for DOE EKG today NSR @ 83bpm, normal EKG, no change compared to prior EKG  Heart has not been evaluated - will refer to cardio

## 2022-08-25 NOTE — Assessment & Plan Note (Signed)
Chronic A1c 4.9% today

## 2022-08-25 NOTE — Assessment & Plan Note (Signed)
Chronic Tsh has been in the normal range Continue levothyroxine 175 mcg daily

## 2022-08-25 NOTE — Assessment & Plan Note (Signed)
Chronic ?Stable ?Following with Dr. Upton ?

## 2022-08-25 NOTE — Assessment & Plan Note (Signed)
Chronic GERD controlled Continue famotidine 40 mg twice daily 

## 2022-09-13 ENCOUNTER — Other Ambulatory Visit: Payer: Self-pay | Admitting: Physician Assistant

## 2022-09-13 DIAGNOSIS — D508 Other iron deficiency anemias: Secondary | ICD-10-CM

## 2022-09-13 DIAGNOSIS — D631 Anemia in chronic kidney disease: Secondary | ICD-10-CM

## 2022-09-14 ENCOUNTER — Inpatient Hospital Stay: Payer: Medicare Other

## 2022-09-14 ENCOUNTER — Inpatient Hospital Stay (HOSPITAL_BASED_OUTPATIENT_CLINIC_OR_DEPARTMENT_OTHER): Payer: Medicare Other | Admitting: Physician Assistant

## 2022-09-14 ENCOUNTER — Other Ambulatory Visit: Payer: Self-pay

## 2022-09-14 ENCOUNTER — Ambulatory Visit: Payer: Medicare Other

## 2022-09-14 VITALS — BP 105/50 | HR 91 | Temp 98.3°F | Resp 18 | Ht 64.0 in | Wt 205.1 lb

## 2022-09-14 DIAGNOSIS — D508 Other iron deficiency anemias: Secondary | ICD-10-CM

## 2022-09-14 DIAGNOSIS — D472 Monoclonal gammopathy: Secondary | ICD-10-CM | POA: Diagnosis not present

## 2022-09-14 DIAGNOSIS — N182 Chronic kidney disease, stage 2 (mild): Secondary | ICD-10-CM | POA: Diagnosis not present

## 2022-09-14 DIAGNOSIS — I129 Hypertensive chronic kidney disease with stage 1 through stage 4 chronic kidney disease, or unspecified chronic kidney disease: Secondary | ICD-10-CM | POA: Diagnosis not present

## 2022-09-14 DIAGNOSIS — D631 Anemia in chronic kidney disease: Secondary | ICD-10-CM

## 2022-09-14 LAB — IRON AND IRON BINDING CAPACITY (CC-WL,HP ONLY)
Iron: 122 ug/dL (ref 28–170)
Saturation Ratios: 42 % — ABNORMAL HIGH (ref 10.4–31.8)
TIBC: 293 ug/dL (ref 250–450)
UIBC: 171 ug/dL (ref 148–442)

## 2022-09-14 LAB — CMP (CANCER CENTER ONLY)
ALT: 12 U/L (ref 0–44)
AST: 15 U/L (ref 15–41)
Albumin: 4.3 g/dL (ref 3.5–5.0)
Alkaline Phosphatase: 62 U/L (ref 38–126)
Anion gap: 7 (ref 5–15)
BUN: 40 mg/dL — ABNORMAL HIGH (ref 8–23)
CO2: 25 mmol/L (ref 22–32)
Calcium: 9.1 mg/dL (ref 8.9–10.3)
Chloride: 111 mmol/L (ref 98–111)
Creatinine: 2.55 mg/dL — ABNORMAL HIGH (ref 0.44–1.00)
GFR, Estimated: 19 mL/min — ABNORMAL LOW (ref >60)
Glucose, Bld: 109 mg/dL — ABNORMAL HIGH (ref 70–99)
Potassium: 4.2 mmol/L (ref 3.5–5.1)
Sodium: 143 mmol/L (ref 135–145)
Total Bilirubin: 0.4 mg/dL (ref 0.3–1.2)
Total Protein: 7.1 g/dL (ref 6.5–8.1)

## 2022-09-14 LAB — CBC WITH DIFFERENTIAL (CANCER CENTER ONLY)
Abs Immature Granulocytes: 0.01 10*3/uL (ref 0.00–0.07)
Basophils Absolute: 0 10*3/uL (ref 0.0–0.1)
Basophils Relative: 1 %
Eosinophils Absolute: 0.2 10*3/uL (ref 0.0–0.5)
Eosinophils Relative: 4 %
HCT: 28.3 % — ABNORMAL LOW (ref 36.0–46.0)
Hemoglobin: 9.4 g/dL — ABNORMAL LOW (ref 12.0–15.0)
Immature Granulocytes: 0 %
Lymphocytes Relative: 50 %
Lymphs Abs: 2.3 10*3/uL (ref 0.7–4.0)
MCH: 33.2 pg (ref 26.0–34.0)
MCHC: 33.2 g/dL (ref 30.0–36.0)
MCV: 100 fL (ref 80.0–100.0)
Monocytes Absolute: 0.6 10*3/uL (ref 0.1–1.0)
Monocytes Relative: 13 %
Neutro Abs: 1.4 10*3/uL — ABNORMAL LOW (ref 1.7–7.7)
Neutrophils Relative %: 32 %
Platelet Count: 162 10*3/uL (ref 150–400)
RBC: 2.83 MIL/uL — ABNORMAL LOW (ref 3.87–5.11)
RDW: 14.2 % (ref 11.5–15.5)
WBC Count: 4.5 10*3/uL (ref 4.0–10.5)
nRBC: 0 % (ref 0.0–0.2)

## 2022-09-14 LAB — FERRITIN: Ferritin: 161 ng/mL (ref 11–307)

## 2022-09-14 MED ORDER — DARBEPOETIN ALFA 300 MCG/0.6ML IJ SOSY
300.0000 ug | PREFILLED_SYRINGE | Freq: Once | INTRAMUSCULAR | Status: AC
  Administered 2022-09-14: 300 ug via SUBCUTANEOUS
  Filled 2022-09-14: qty 0.6

## 2022-09-14 NOTE — Patient Instructions (Signed)

## 2022-09-15 ENCOUNTER — Encounter: Payer: Self-pay | Admitting: Hematology & Oncology

## 2022-09-15 NOTE — Progress Notes (Signed)
[facility] [phone]   [phone]) [phone]  PROGRESS NOTE  Patient Care Team: Pincus Sanes, MD as PCP - General (Internal Medicine) Kathyrn Sheriff, [facility] as Pharmacist (Pharmacist) Gelene Mink, OD as Referring Physician (Optometry)  Hematological/Oncological History # Anemia in the Setting of Chronic Kidney Disease  05/19/2022: last visit with [name] 07/08/2022: establish care with Dr. Leonides Schanz   CHIEF COMPLAINTS/PURPOSE OF CONSULTATION:  "Anemia 2/2 to CKD "  HISTORY OF PRESENTING ILLNESS:  Tina Molina 77 y.o. female with medical history significant for anemia secondary to CKD presents for a follow up. She was last seen by Dr. Leonides Schanz on 07/08/2022 to establish care after transferring from [facility] In the interim, she received her aranesp injection last on 07/22/2022.   On exam today Ms. Ventress reports that she is doing well without any new or concerning symptoms. Her energy levels are overall stable. She does have fatigue which can impact her ADLs and require her to rest as needed. She has a good appetite and denies any weight changes. She does have shortness of breath with exertion that is chronic in nautre. She denies headaches or dizziness. She is otherwise feeling well and would like to proceed with her injection today. She denies fevers, chills, sweats, chest pain, cough, nausea, vomiting or diarrhea. Rest of the 10 point ROS is below.    MEDICAL HISTORY:  Past Medical History:  Diagnosis Date   Anemia    Associated with leukopenia and lymphocytosis. This is been evaluated by Dr. Myna Hidalgo . Her platelet count has been normal   Arthritis    Chronic back pain    Chronic kidney disease    Erythropoietin deficiency anemia 02/23/2016   GERD (gastroesophageal reflux disease)    Hypertension    Lymphocytosis    MGUS (monoclonal gammopathy of unknown significance) 08/03/2012   IgA 792 11/04/11   Pneumonia  04/2010   OP   Renal insufficiency    Sarcoidosis    Thyroid disease     SURGICAL HISTORY: Past Surgical History:  Procedure Laterality Date   CARPAL TUNNEL RELEASE     Bilateral    CATARACT EXTRACTION Right 05/2015   CATARACT EXTRACTION Left    CERVICAL DISCECTOMY  06/2010   Dr.Pool   CHOLECYSTECTOMY     COLONOSCOPY  2010   POSTERIOR LUMBAR FUSION  02/29/2012   Dr Kathlen Brunswick op respiratory & renal compromise   THYROIDECTOMY  2003   For goiter   UPPER GASTROINTESTINAL ENDOSCOPY  12/01/2011   DB    SOCIAL HISTORY: Social History   Socioeconomic History   Marital status: Divorced    Spouse name: Not on file   Number of children: 0   Years of education: 12   Highest education level: Not on file  Occupational History   Occupation: Retired    Associate Professor: RETIRED  Tobacco Use   Smoking status: Former    Packs/day: 0.50    Years: 20.00    Additional pack years: 0.00    Total pack years: 10.00    Types: Cigarettes    Quit date: 05/17/1997    Years since quitting: 25.3   Smokeless tobacco: Never  Vaping Use   Vaping Use: Never used  Substance and Sexual Activity   Alcohol use: Yes    Alcohol/week: 1.0 standard drink of alcohol    Types: 1 Glasses of wine per week    Comment: occasionally , less than weekly   Drug  use: No   Sexual activity: Not Currently  Other Topics Concern   Not on file  Social History Narrative   Patient is divorced and lives with her sister. Patient is retired.   Caffeine- sometimes - one cup   Right handed.   Social Determinants of Health   Financial Resource Strain: Low Risk  (08/18/2021)   Overall Financial Resource Strain (CARDIA)    Difficulty of Paying Living Expenses: Not hard at all  Food Insecurity: No Food Insecurity (08/18/2021)   Hunger Vital Sign    Worried About Running Out of Food in the Last Year: Never true    Ran Out of Food in the Last Year: Never true  Transportation Needs: No Transportation Needs (08/18/2021)   PRAPARE -  Administrator, Civil Service (Medical): No    Lack of Transportation (Non-Medical): No  Physical Activity: Inactive (08/18/2021)   Exercise Vital Sign    Days of Exercise per Week: 0 days    Minutes of Exercise per Session: 0 min  Stress: No Stress Concern Present (08/18/2021)   [employer] of Occupational Health - Occupational Stress Questionnaire    Feeling of Stress : Not at all  Social Connections: Moderately Integrated (08/18/2021)   Social Connection and Isolation Panel [NHANES]    Frequency of Communication with Friends and Family: More than three times a week    Frequency of Social Gatherings with Friends and Family: More than three times a week    Attends Religious Services: More than 4 times per year    Active Member of [employer] or Organizations: Yes    Attends Engineer, structural: More than 4 times per year    Marital Status: Divorced  Intimate Partner Violence: Not At Risk (08/18/2021)   Humiliation, Afraid, Rape, and Kick questionnaire    Fear of Current or Ex-Partner: No    Emotionally Abused: No    Physically Abused: No    Sexually Abused: No    FAMILY HISTORY: Family History  Problem Relation Age of Onset   Anemia Father    Arthritis Father    Heart failure Father 70       No CAD   Diabetes Mother    Arthritis Mother    Glaucoma Mother    Heart disease Mother 22       No CAD   Hypertension Brother    Anemia Brother    Kidney disease Brother    Hypertension Sister    Kidney disease Sister    Anemia Sister    Diabetes Maternal Aunt    Arthritis Paternal Grandmother    Colon cancer Neg Hx    Colon polyps Neg Hx    Esophageal cancer Neg Hx    Rectal cancer Neg Hx    Stomach cancer Neg Hx     ALLERGIES:  is allergic to penicillins, shellfish allergy, shellfish-derived products, codeine, infliximab, sulfonamide derivatives, azithromycin, and iodine.  MEDICATIONS:  Current Outpatient Medications  Medication Sig Dispense Refill    aspirin 81 MG tablet Take 81 mg by mouth daily.     atorvastatin (LIPITOR) 20 MG tablet TAKE ONE TABLET BY MOUTH ONCE DAILY 90 tablet 1   budesonide-formoterol (SYMBICORT) 160-4.5 MCG/ACT inhaler inhale 2 puffs if needed for wheezing 10.2 g 0   carvedilol (COREG) 25 MG tablet TAKE ONE TABLET BY MOUTH TWICE DAILY 180 tablet 1   cyclobenzaprine (FLEXERIL) 10 MG tablet TAKE ONE TABLET BY MOUTH THREE TIMES DAILY 270 tablet 1  famotidine (PEPCID) 40 MG tablet TAKE ONE TABLET BY MOUTH TWICE DAILY 180 tablet 2   furosemide (LASIX) 20 MG tablet TAKE ONE TABLET BY MOUTH ONCE DAILY 90 tablet 2   gabapentin (NEURONTIN) 300 MG capsule TAKE ONE CAPSULE BY MOUTH THREE TIMES DAILY 270 capsule 1   hydrALAZINE (APRESOLINE) 50 MG tablet TAKE ONE TABLET BY MOUTH THREE TIMES DAILY 270 tablet 1   latanoprost (XALATAN) 0.005 % ophthalmic solution 1 drop at bedtime.     leflunomide (ARAVA) 20 MG tablet Take 20 mg by mouth daily.     levothyroxine (SYNTHROID) 175 MCG tablet Take 1 tablet (175 mcg total) by mouth daily. 90 tablet 3   MELATONIN GUMMIES PO Take by mouth at bedtime as needed.     Multiple Vitamin (MULTIVITAMIN WITH MINERALS) TABS Take 1 tablet by mouth daily.     spironolactone (ALDACTONE) 25 MG tablet TAKE ONE TABLET BY MOUTH ONCE DAILY 90 tablet 2   telmisartan (MICARDIS) 20 MG tablet Take 1 tablet (20 mg total) by mouth daily. 90 tablet 3   triamcinolone ointment (KENALOG) 0.5 % APPLY TO THE AFFECTED AREA TWICE DAILY USE FOR UPTO 14 DAYS 15 g 0   vitamin E 400 UNIT capsule Take 400 Units by mouth daily.      cyanocobalamin 100 MCG tablet Take 100 mcg by mouth daily. (Patient not taking: Reported on 09/14/2022)     No current facility-administered medications for this visit.    REVIEW OF SYSTEMS:   Constitutional: ( - ) fevers, ( - )  chills , ( - ) night sweats Eyes: ( - ) blurriness of vision, ( - ) double vision, ( - ) watery eyes Ears, nose, mouth, throat, and face: ( - ) mucositis, ( - ) sore  throat Respiratory: ( - ) cough, ( - ) dyspnea, ( - ) wheezes Cardiovascular: ( - ) palpitation, ( - ) chest discomfort, ( - ) lower extremity swelling Gastrointestinal:  ( - ) nausea, ( - ) heartburn, ( - ) change in bowel habits Skin: ( - ) abnormal skin rashes Lymphatics: ( - ) new lymphadenopathy, ( - ) easy bruising Neurological: ( - ) numbness, ( - ) tingling, ( - ) new weaknesses Behavioral/Psych: ( - ) mood change, ( - ) new changes  All other systems were reviewed with the patient and are negative.  PHYSICAL EXAMINATION:  Vitals:   09/14/22 1302  BP: (!) 105/50  Pulse: 91  Resp: 18  Temp: 98.3 F (36.8 C)  SpO2: 98%   Filed Weights   09/14/22 1302  Weight: 205 lb 1.6 oz (93 kg)    GENERAL: well appearing elderly African-American female in NAD  SKIN: skin color, texture, turgor are normal, no rashes or significant lesions EYES: conjunctiva are pink and non-injected, sclera clear LUNGS: clear to auscultation and percussion with normal breathing effort HEART: regular rate & rhythm and no murmurs and no lower extremity edema Musculoskeletal: no cyanosis of digits and no clubbing  PSYCH: Molina & oriented x 3, fluent speech NEURO: no focal motor/sensory deficits  LABORATORY DATA:  I have reviewed the data as listed    Latest Ref Rng & Units 09/14/2022   12:42 PM 08/19/2022    2:08 PM 07/22/2022    1:44 PM  CBC  WBC 4.0 - 10.5 K/uL 4.5  3.6  4.0   Hemoglobin 12.0 - 15.0 g/dL 9.4  16.1  9.7   Hematocrit 36.0 - 46.0 % 28.3  34.0  28.5   Platelets 150 - 400 K/uL 162  181  169        Latest Ref Rng & Units 09/14/2022   12:42 PM 08/19/2022    2:08 PM 07/22/2022    1:44 PM  CMP  Glucose 70 - 99 mg/dL 161  096  045   BUN 8 - 23 mg/dL 40  48  44   Creatinine 0.44 - 1.00 mg/dL 4.09  8.11  9.14   Sodium 135 - 145 mmol/L 143  142  142   Potassium 3.5 - 5.1 mmol/L 4.2  4.6  4.1   Chloride 98 - 111 mmol/L 111  108  111   CO2 22 - 32 mmol/L 25  27  24    Calcium 8.9 - 10.3  mg/dL 9.1  9.4  8.8   Total Protein 6.5 - 8.1 g/dL 7.1  7.3  7.1   Total Bilirubin 0.3 - 1.2 mg/dL 0.4  0.3  0.4   Alkaline Phos 38 - 126 U/L 62  62  64   AST 15 - 41 U/L 15  17  14    ALT 0 - 44 U/L 12  14  11       ASSESSMENT & PLAN Tina Molina 77 y.o. female with medical history significant for anemia secondary to CKD who presents for transferring care from [facility].  After review of the labs, review of the records, and discussion with the patient the patients findings are most consistent with anemia secondary to chronic kidney disease.  # Anemia in the Setting of Chronic Kidney Disease  --currently managed on Aranesp 300 mcg SQ q 14 days to maintain Hgb >10 -- White blood cell 4.5, hemoglobin 9.4, MCV 100.0, and platelets of 162. Iron levels show no deficiency.  --RTC in 2 months with q 2 week labs and injection   No orders of the defined types were placed in this encounter.   All questions were answered. The patient knows to call the clinic with any problems, questions or concerns.  A total of more than 25 minutes were spent on this encounter with face-to-face time and non-face-to-face time, including preparing to see the patient, ordering tests and/or medications, counseling the patient and coordination of care as outlined above.   Georga Kaufmann PA-C Dept of Hematology and Oncology [facility] Phone: [phone]   09/15/2022 5:58 AM

## 2022-09-16 ENCOUNTER — Ambulatory Visit: Payer: Medicare Other

## 2022-09-23 DIAGNOSIS — M1991 Primary osteoarthritis, unspecified site: Secondary | ICD-10-CM | POA: Diagnosis not present

## 2022-09-23 DIAGNOSIS — R5382 Chronic fatigue, unspecified: Secondary | ICD-10-CM | POA: Diagnosis not present

## 2022-09-23 DIAGNOSIS — M0589 Other rheumatoid arthritis with rheumatoid factor of multiple sites: Secondary | ICD-10-CM | POA: Diagnosis not present

## 2022-09-23 DIAGNOSIS — N184 Chronic kidney disease, stage 4 (severe): Secondary | ICD-10-CM | POA: Diagnosis not present

## 2022-09-23 DIAGNOSIS — R21 Rash and other nonspecific skin eruption: Secondary | ICD-10-CM | POA: Diagnosis not present

## 2022-09-23 DIAGNOSIS — D860 Sarcoidosis of lung: Secondary | ICD-10-CM | POA: Diagnosis not present

## 2022-09-23 DIAGNOSIS — Z6835 Body mass index (BMI) 35.0-35.9, adult: Secondary | ICD-10-CM | POA: Diagnosis not present

## 2022-09-23 DIAGNOSIS — E669 Obesity, unspecified: Secondary | ICD-10-CM | POA: Diagnosis not present

## 2022-09-23 DIAGNOSIS — M25512 Pain in left shoulder: Secondary | ICD-10-CM | POA: Diagnosis not present

## 2022-09-23 DIAGNOSIS — M5136 Other intervertebral disc degeneration, lumbar region: Secondary | ICD-10-CM | POA: Diagnosis not present

## 2022-09-29 NOTE — Progress Notes (Signed)
Cardiology Office Note   Date:  10/01/2022   ID:  Tina Molina, DOB [birth year], MRN [id number]  PCP:  Pincus Sanes, MD  Cardiologist:   None Referring:  Pincus Sanes, MD  Chief Complaint  Patient presents with   Shortness of Breath      History of Present Illness: Tina Molina is a 77 y.o. female who presents for evaluation of SOB.  I saw her last in 2014 for evaluation of difficult to control HTN.  I see an echocardiogram from around that time that was unremarkable.  Otherwise she has not had any prior cardiac testing.  She presents because she is been having shortness of breath probably over several months if not a couple of years.  This is been slowly progressive.  She is short of breath doing household chores.  She is short of breath walking 20 feet on level ground.  She is not describing PND or orthopnea.  She not describing chest discomfort except for some mild tightness when she gets very short of breath.  She is not describing neck or arm discomfort.  She is not having any new palpitations except her heart will beat fast when she is short of breath.  She is not having any presyncope or syncope.  She has some mild lower extremity swelling occasionally.  She does have a history of sarcoid but has not had any pulmonary workup or imaging recently and does not think this is an active problem.  She does have chronic kidney disease and also anemia.  She is followed by nephrology and hematology.  Past Medical History:  Diagnosis Date   Anemia    Associated with leukopenia and lymphocytosis. This is been evaluated by Dr. Myna Hidalgo . Her platelet count has been normal   Arthritis    Chronic back pain    Chronic kidney disease    Erythropoietin deficiency anemia 02/23/2016   GERD (gastroesophageal reflux disease)    Hypertension    Lymphocytosis    MGUS (monoclonal gammopathy of unknown significance) 08/03/2012   IgA 792 11/04/11   Pneumonia 04/2010   OP   Renal insufficiency     Sarcoidosis    Thyroid disease     Past Surgical History:  Procedure Laterality Date   CARPAL TUNNEL RELEASE     Bilateral    CATARACT EXTRACTION Right 05/2015   CATARACT EXTRACTION Left    CERVICAL DISCECTOMY  06/2010   Dr.Pool   CHOLECYSTECTOMY     COLONOSCOPY  2010   POSTERIOR LUMBAR FUSION  02/29/2012   Dr Kathlen Brunswick op respiratory & renal compromise   THYROIDECTOMY  2003   For goiter   UPPER GASTROINTESTINAL ENDOSCOPY  12/01/2011   DB     Current Outpatient Medications  Medication Sig Dispense Refill   aspirin 81 MG tablet Take 81 mg by mouth daily.     atorvastatin (LIPITOR) 20 MG tablet TAKE ONE TABLET BY MOUTH ONCE DAILY 90 tablet 1   budesonide-formoterol (SYMBICORT) 160-4.5 MCG/ACT inhaler inhale 2 puffs if needed for wheezing 10.2 g 0   carvedilol (COREG) 25 MG tablet TAKE ONE TABLET BY MOUTH TWICE DAILY 180 tablet 1   cyanocobalamin 100 MCG tablet Take 100 mcg by mouth daily.     cyclobenzaprine (FLEXERIL) 10 MG tablet TAKE ONE TABLET BY MOUTH THREE TIMES DAILY 270 tablet 1   famotidine (PEPCID) 40 MG tablet TAKE ONE TABLET BY MOUTH TWICE DAILY 180 tablet 2   furosemide (LASIX) 20 MG  tablet TAKE ONE TABLET BY MOUTH ONCE DAILY 90 tablet 2   gabapentin (NEURONTIN) 300 MG capsule TAKE ONE CAPSULE BY MOUTH THREE TIMES DAILY 270 capsule 1   hydrALAZINE (APRESOLINE) 50 MG tablet TAKE ONE TABLET BY MOUTH THREE TIMES DAILY 270 tablet 1   latanoprost (XALATAN) 0.005 % ophthalmic solution 1 drop at bedtime.     leflunomide (ARAVA) 20 MG tablet Take 20 mg by mouth daily.     levothyroxine (SYNTHROID) 175 MCG tablet Take 1 tablet (175 mcg total) by mouth daily. 90 tablet 3   MELATONIN GUMMIES PO Take by mouth at bedtime as needed.     Multiple Vitamin (MULTIVITAMIN WITH MINERALS) TABS Take 1 tablet by mouth daily.     spironolactone (ALDACTONE) 25 MG tablet TAKE ONE TABLET BY MOUTH ONCE DAILY 90 tablet 2   telmisartan (MICARDIS) 20 MG tablet Take 1 tablet (20 mg total) by  mouth daily. 90 tablet 3   triamcinolone ointment (KENALOG) 0.5 % APPLY TO THE AFFECTED AREA TWICE DAILY USE FOR UPTO 14 DAYS 15 g 0   vitamin E 400 UNIT capsule Take 400 Units by mouth daily.      No current facility-administered medications for this visit.    Allergies:   Penicillins, Shellfish allergy, Shellfish-derived products, Codeine, Infliximab, Sulfonamide derivatives, Azithromycin, and Iodine    Social History:  The patient  reports that she quit smoking about 25 years ago. Her smoking use included cigarettes. She has a 10.00 pack-year smoking history. She has never used smokeless tobacco. She reports current alcohol use of about 1.0 standard drink of alcohol per week. She reports that she does not use drugs.   Family History:  The patient's family history includes Anemia in her brother, father, and sister; Arthritis in her father, mother, and paternal grandmother; Diabetes in her maternal aunt and mother; Glaucoma in her mother; Heart disease (age of onset: 22) in her mother; Heart failure (age of onset: 55) in her father; Hypertension in her brother and sister; Kidney disease in her brother and sister.    ROS:  Please see the history of present illness.   Otherwise, review of systems are positive for none.   All other systems are reviewed and negative.    PHYSICAL EXAM: VS:  BP 118/60   Pulse 84   Ht 5\' 4"  (1.626 m)   Wt 204 lb (92.5 kg)   BMI 35.02 kg/m  , BMI Body mass index is 35.02 kg/m. GENERAL:  Well appearing HEENT:  Pupils equal round and reactive, fundi not visualized, oral mucosa unremarkable NECK:  No jugular venous distention, waveform within normal limits, carotid upstroke brisk and symmetric, no bruits, no thyromegaly LYMPHATICS:  No cervical, inguinal adenopathy LUNGS:  Clear to auscultation bilaterally BACK:  No CVA tenderness CHEST:  Unremarkable HEART:  PMI not displaced or sustained,S1 and S2 within normal limits, no S3, no S4, no clicks, no rubs, no  murmurs ABD:  Flat, positive bowel sounds normal in frequency in pitch, no bruits, no rebound, no guarding, no midline pulsatile mass, no hepatomegaly, no splenomegaly EXT:  2 plus pulses throughout, no edema, no cyanosis no clubbing SKIN:  No rashes no nodules NEURO:  Cranial nerves II through XII grossly intact, motor grossly intact throughout PSYCH:  Cognitively intact, oriented to person place and time    EKG:  EKG is ordered today. The ekg ordered today demonstrates sinus rhythm, rate 84, axis within normal limits, intervals within normal limits, no acute ST-T wave change.  Recent Labs: 02/23/2022: TSH 0.76 09/30/2022: ALT 13; BUN 37; Creatinine 2.49; Hemoglobin 10.3; Platelet Count 178; Potassium 4.5; Sodium 144    Lipid Panel    Component Value Date/Time   CHOL 184 02/23/2022 1518   TRIG 129.0 02/23/2022 1518   HDL 55.60 02/23/2022 1518   CHOLHDL 3 02/23/2022 1518   VLDL 25.8 02/23/2022 1518   LDLCALC 103 (H) 02/23/2022 1518   LDLDIRECT 156.5 05/27/2011 1619      Wt Readings from Last 3 Encounters:  10/01/22 204 lb (92.5 kg)  09/14/22 205 lb 1.6 oz (93 kg)  08/25/22 205 lb (93 kg)      Other studies Reviewed: Additional studies/ records that were reviewed today include: Labs, hematology notes. Review of the above records demonstrates:  Please see elsewhere in the note.     ASSESSMENT AND PLAN:  SOB: The patient has shortness of breath that could be weight and deconditioning or primary pulmonary process.  Today walking around the office her saturation stayed in the mid to high 90s and she was dyspneic.  I will start with a BNP level, echocardiogram and chest x-ray.  I will have a low threshold for ischemia workup and may be pulmonary function testing.  HTN: The blood pressure is controlled.  No change in therapy.  Anemia chronic: Hemoglobin is 10.3 but this is actually stable and she is followed by heme.  CKD 3B: She is followed by nephrology.  Current  medicines are reviewed at length with the patient today.  The patient does not have concerns regarding medicines.  The following changes have been made:  no change  Labs/ tests ordered today include:   Orders Placed This Encounter  Procedures   DG Chest 2 View   Pro b natriuretic peptide (BNP)[id number])   EKG 12-Lead   ECHOCARDIOGRAM COMPLETE     Disposition:   FU with me after the studies.     Signed, Rollene Rotunda, MD  10/01/2022 2:33 PM    [facility]

## 2022-09-30 ENCOUNTER — Inpatient Hospital Stay: Payer: Medicare Other | Attending: Hematology & Oncology

## 2022-09-30 ENCOUNTER — Inpatient Hospital Stay: Payer: Medicare Other

## 2022-09-30 ENCOUNTER — Other Ambulatory Visit: Payer: Self-pay

## 2022-09-30 VITALS — BP 144/87 | HR 98 | Temp 98.4°F | Resp 20

## 2022-09-30 DIAGNOSIS — I129 Hypertensive chronic kidney disease with stage 1 through stage 4 chronic kidney disease, or unspecified chronic kidney disease: Secondary | ICD-10-CM | POA: Diagnosis not present

## 2022-09-30 DIAGNOSIS — D631 Anemia in chronic kidney disease: Secondary | ICD-10-CM | POA: Diagnosis not present

## 2022-09-30 DIAGNOSIS — D508 Other iron deficiency anemias: Secondary | ICD-10-CM

## 2022-09-30 DIAGNOSIS — N189 Chronic kidney disease, unspecified: Secondary | ICD-10-CM | POA: Diagnosis not present

## 2022-09-30 LAB — CMP (CANCER CENTER ONLY)
ALT: 13 U/L (ref 0–44)
AST: 17 U/L (ref 15–41)
Albumin: 4.2 g/dL (ref 3.5–5.0)
Alkaline Phosphatase: 68 U/L (ref 38–126)
Anion gap: 7 (ref 5–15)
BUN: 37 mg/dL — ABNORMAL HIGH (ref 8–23)
CO2: 27 mmol/L (ref 22–32)
Calcium: 8.5 mg/dL — ABNORMAL LOW (ref 8.9–10.3)
Chloride: 110 mmol/L (ref 98–111)
Creatinine: 2.49 mg/dL — ABNORMAL HIGH (ref 0.44–1.00)
GFR, Estimated: 20 mL/min — ABNORMAL LOW (ref >60)
Glucose, Bld: 106 mg/dL — ABNORMAL HIGH (ref 70–99)
Potassium: 4.5 mmol/L (ref 3.5–5.1)
Sodium: 144 mmol/L (ref 135–145)
Total Bilirubin: 0.3 mg/dL (ref 0.3–1.2)
Total Protein: 7 g/dL (ref 6.5–8.1)

## 2022-09-30 LAB — CBC WITH DIFFERENTIAL (CANCER CENTER ONLY)
Abs Immature Granulocytes: 0.01 10*3/uL (ref 0.00–0.07)
Basophils Absolute: 0 10*3/uL (ref 0.0–0.1)
Basophils Relative: 1 %
Eosinophils Absolute: 0.2 10*3/uL (ref 0.0–0.5)
Eosinophils Relative: 3 %
HCT: 31.9 % — ABNORMAL LOW (ref 36.0–46.0)
Hemoglobin: 10.3 g/dL — ABNORMAL LOW (ref 12.0–15.0)
Immature Granulocytes: 0 %
Lymphocytes Relative: 50 %
Lymphs Abs: 2.6 10*3/uL (ref 0.7–4.0)
MCH: 34.1 pg — ABNORMAL HIGH (ref 26.0–34.0)
MCHC: 32.3 g/dL (ref 30.0–36.0)
MCV: 105.6 fL — ABNORMAL HIGH (ref 80.0–100.0)
Monocytes Absolute: 0.8 10*3/uL (ref 0.1–1.0)
Monocytes Relative: 15 %
Neutro Abs: 1.6 10*3/uL — ABNORMAL LOW (ref 1.7–7.7)
Neutrophils Relative %: 31 %
Platelet Count: 178 10*3/uL (ref 150–400)
RBC: 3.02 MIL/uL — ABNORMAL LOW (ref 3.87–5.11)
RDW: 16.8 % — ABNORMAL HIGH (ref 11.5–15.5)
WBC Count: 5.1 10*3/uL (ref 4.0–10.5)
nRBC: 0 % (ref 0.0–0.2)

## 2022-09-30 MED ORDER — DARBEPOETIN ALFA 300 MCG/0.6ML IJ SOSY: 300.0000 ug | PREFILLED_SYRINGE | Freq: Once | INTRAMUSCULAR | Status: DC

## 2022-10-01 ENCOUNTER — Ambulatory Visit: Payer: Medicare Other | Attending: Cardiology | Admitting: Cardiology

## 2022-10-01 ENCOUNTER — Ambulatory Visit
Admission: RE | Admit: 2022-10-01 | Discharge: 2022-10-01 | Disposition: A | Discharge status: Home / Self Care | Payer: Medicare Other | Source: Ambulatory Visit | Attending: Cardiology | Admitting: Cardiology

## 2022-10-01 ENCOUNTER — Encounter: Payer: Self-pay | Admitting: Cardiology

## 2022-10-01 VITALS — BP 118/60 | HR 84 | Ht 64.0 in | Wt 204.0 lb

## 2022-10-01 DIAGNOSIS — R0600 Dyspnea, unspecified: Secondary | ICD-10-CM | POA: Diagnosis not present

## 2022-10-01 DIAGNOSIS — I10 Essential (primary) hypertension: Secondary | ICD-10-CM | POA: Diagnosis not present

## 2022-10-01 DIAGNOSIS — R0602 Shortness of breath: Secondary | ICD-10-CM | POA: Diagnosis not present

## 2022-10-01 NOTE — Patient Instructions (Signed)
    Testing/Procedures:  Your physician has requested that you have an echocardiogram. Echocardiography is a painless test that uses sound waves to create images of your heart. It provides your doctor with information about the size and shape of your heart and how well your heart's chambers and valves are working. This procedure takes approximately one hour. There are no restrictions for this procedure. Please do NOT wear cologne, perfume, aftershave, or lotions (deodorant is allowed). Please arrive 15 minutes prior to your appointment time. [address]  A chest x-ray takes a picture of the organs and structures inside the chest, including the heart, lungs, and blood vessels. This test can show several things, including, whether the heart is enlarges; whether fluid is building up in the lungs; and whether pacemaker / defibrillator leads are still in place. [facility] - [address]   Follow-Up: At [facility], you and your health needs are our priority.  As part of our continuing mission to provide you with exceptional heart care, we have created designated Provider Care Teams.  These Care Teams include your primary Cardiologist (physician) and Advanced Practice Providers (APPs -  Physician Assistants and Nurse Practitioners) who all work together to provide you with the care you need, when you need it.  We recommend signing up for the patient portal called "MyChart".  Sign up information is provided on this After Visit Summary.  MyChart is used to connect with patients for Virtual Visits (Telemedicine).  Patients are able to view lab/test results, encounter notes, upcoming appointments, etc.  Non-urgent messages can be sent to your provider as well.   To learn more about what you can do with MyChart, go to [email or web address].    Your next appointment:    AFTER ECHO

## 2022-10-02 LAB — PRO B NATRIURETIC PEPTIDE: NT-Pro BNP: 233 pg/mL (ref 0–738)

## 2022-10-06 ENCOUNTER — Encounter: Payer: Self-pay | Admitting: *Deleted

## 2022-10-07 ENCOUNTER — Encounter: Payer: Self-pay | Admitting: *Deleted

## 2022-10-14 ENCOUNTER — Other Ambulatory Visit: Payer: Self-pay | Admitting: Hematology and Oncology

## 2022-10-14 ENCOUNTER — Inpatient Hospital Stay: Payer: Medicare Other

## 2022-10-14 DIAGNOSIS — N189 Chronic kidney disease, unspecified: Secondary | ICD-10-CM | POA: Diagnosis not present

## 2022-10-14 DIAGNOSIS — D631 Anemia in chronic kidney disease: Secondary | ICD-10-CM

## 2022-10-14 DIAGNOSIS — I129 Hypertensive chronic kidney disease with stage 1 through stage 4 chronic kidney disease, or unspecified chronic kidney disease: Secondary | ICD-10-CM | POA: Diagnosis not present

## 2022-10-14 LAB — CBC WITH DIFFERENTIAL (CANCER CENTER ONLY)
Abs Immature Granulocytes: 0.02 10*3/uL (ref 0.00–0.07)
Basophils Absolute: 0 10*3/uL (ref 0.0–0.1)
Basophils Relative: 1 %
Eosinophils Absolute: 0.1 10*3/uL (ref 0.0–0.5)
Eosinophils Relative: 4 %
HCT: 34.1 % — ABNORMAL LOW (ref 36.0–46.0)
Hemoglobin: 11.1 g/dL — ABNORMAL LOW (ref 12.0–15.0)
Immature Granulocytes: 1 %
Lymphocytes Relative: 57 %
Lymphs Abs: 2.1 10*3/uL (ref 0.7–4.0)
MCH: 33.1 pg (ref 26.0–34.0)
MCHC: 32.6 g/dL (ref 30.0–36.0)
MCV: 101.8 fL — ABNORMAL HIGH (ref 80.0–100.0)
Monocytes Absolute: 0.4 10*3/uL (ref 0.1–1.0)
Monocytes Relative: 11 %
Neutro Abs: 1 10*3/uL — ABNORMAL LOW (ref 1.7–7.7)
Neutrophils Relative %: 26 %
Platelet Count: 173 10*3/uL (ref 150–400)
RBC: 3.35 MIL/uL — ABNORMAL LOW (ref 3.87–5.11)
RDW: 14.2 % (ref 11.5–15.5)
WBC Count: 3.6 10*3/uL — ABNORMAL LOW (ref 4.0–10.5)
nRBC: 0 % (ref 0.0–0.2)

## 2022-10-14 LAB — CMP (CANCER CENTER ONLY)
ALT: 11 U/L (ref 0–44)
AST: 19 U/L (ref 15–41)
Albumin: 4.2 g/dL (ref 3.5–5.0)
Alkaline Phosphatase: 66 U/L (ref 38–126)
Anion gap: 6 (ref 5–15)
BUN: 41 mg/dL — ABNORMAL HIGH (ref 8–23)
CO2: 27 mmol/L (ref 22–32)
Calcium: 8.8 mg/dL — ABNORMAL LOW (ref 8.9–10.3)
Chloride: 109 mmol/L (ref 98–111)
Creatinine: 2.26 mg/dL — ABNORMAL HIGH (ref 0.44–1.00)
GFR, Estimated: 22 mL/min — ABNORMAL LOW (ref >60)
Glucose, Bld: 102 mg/dL — ABNORMAL HIGH (ref 70–99)
Potassium: 4.4 mmol/L (ref 3.5–5.1)
Sodium: 142 mmol/L (ref 135–145)
Total Bilirubin: 0.4 mg/dL (ref 0.3–1.2)
Total Protein: 7.4 g/dL (ref 6.5–8.1)

## 2022-10-14 LAB — FERRITIN: Ferritin: 93 ng/mL (ref 11–307)

## 2022-10-14 NOTE — Progress Notes (Signed)
Pt did not meet parameters for injection. Hgb 11.1. Pt was given lab results and advised to f/u as scheduled. Pt verbalized understanding.

## 2022-10-22 ENCOUNTER — Ambulatory Visit
Admission: RE | Admit: 2022-10-22 | Discharge: 2022-10-22 | Disposition: A | Discharge status: Home / Self Care | Payer: Medicare Other | Source: Ambulatory Visit | Attending: Internal Medicine | Admitting: Internal Medicine

## 2022-10-22 DIAGNOSIS — R921 Mammographic calcification found on diagnostic imaging of breast: Secondary | ICD-10-CM

## 2022-10-28 ENCOUNTER — Inpatient Hospital Stay: Payer: Medicare Other

## 2022-10-28 ENCOUNTER — Inpatient Hospital Stay: Payer: Medicare Other | Attending: Hematology & Oncology | Admitting: Hematology and Oncology

## 2022-10-28 ENCOUNTER — Other Ambulatory Visit: Payer: Self-pay

## 2022-10-28 ENCOUNTER — Other Ambulatory Visit: Payer: Self-pay | Admitting: Hematology and Oncology

## 2022-10-28 VITALS — BP 146/72 | HR 96 | Temp 97.2°F | Resp 17 | Wt 203.4 lb

## 2022-10-28 DIAGNOSIS — D631 Anemia in chronic kidney disease: Secondary | ICD-10-CM

## 2022-10-28 DIAGNOSIS — N181 Chronic kidney disease, stage 1: Secondary | ICD-10-CM | POA: Diagnosis not present

## 2022-10-28 DIAGNOSIS — N189 Chronic kidney disease, unspecified: Secondary | ICD-10-CM | POA: Insufficient documentation

## 2022-10-28 DIAGNOSIS — Z87891 Personal history of nicotine dependence: Secondary | ICD-10-CM | POA: Insufficient documentation

## 2022-10-28 DIAGNOSIS — D508 Other iron deficiency anemias: Secondary | ICD-10-CM

## 2022-10-28 DIAGNOSIS — I129 Hypertensive chronic kidney disease with stage 1 through stage 4 chronic kidney disease, or unspecified chronic kidney disease: Secondary | ICD-10-CM | POA: Diagnosis not present

## 2022-10-28 LAB — CBC WITH DIFFERENTIAL (CANCER CENTER ONLY)
Abs Immature Granulocytes: 0.01 10*3/uL (ref 0.00–0.07)
Basophils Absolute: 0 10*3/uL (ref 0.0–0.1)
Basophils Relative: 1 %
Eosinophils Absolute: 0.2 10*3/uL (ref 0.0–0.5)
Eosinophils Relative: 3 %
HCT: 31.4 % — ABNORMAL LOW (ref 36.0–46.0)
Hemoglobin: 10.6 g/dL — ABNORMAL LOW (ref 12.0–15.0)
Immature Granulocytes: 0 %
Lymphocytes Relative: 54 %
Lymphs Abs: 2.7 10*3/uL (ref 0.7–4.0)
MCH: 33.1 pg (ref 26.0–34.0)
MCHC: 33.8 g/dL (ref 30.0–36.0)
MCV: 98.1 fL (ref 80.0–100.0)
Monocytes Absolute: 0.5 10*3/uL (ref 0.1–1.0)
Monocytes Relative: 11 %
Neutro Abs: 1.6 10*3/uL — ABNORMAL LOW (ref 1.7–7.7)
Neutrophils Relative %: 31 %
Platelet Count: 157 10*3/uL (ref 150–400)
RBC: 3.2 MIL/uL — ABNORMAL LOW (ref 3.87–5.11)
RDW: 13.8 % (ref 11.5–15.5)
WBC Count: 5 10*3/uL (ref 4.0–10.5)
nRBC: 0 % (ref 0.0–0.2)

## 2022-10-28 LAB — CMP (CANCER CENTER ONLY)
ALT: 11 U/L (ref 0–44)
AST: 17 U/L (ref 15–41)
Albumin: 4 g/dL (ref 3.5–5.0)
Alkaline Phosphatase: 63 U/L (ref 38–126)
Anion gap: 8 (ref 5–15)
BUN: 33 mg/dL — ABNORMAL HIGH (ref 8–23)
CO2: 26 mmol/L (ref 22–32)
Calcium: 9 mg/dL (ref 8.9–10.3)
Chloride: 109 mmol/L (ref 98–111)
Creatinine: 2.08 mg/dL — ABNORMAL HIGH (ref 0.44–1.00)
GFR, Estimated: 24 mL/min — ABNORMAL LOW (ref >60)
Glucose, Bld: 101 mg/dL — ABNORMAL HIGH (ref 70–99)
Potassium: 4.3 mmol/L (ref 3.5–5.1)
Sodium: 143 mmol/L (ref 135–145)
Total Bilirubin: 0.4 mg/dL (ref 0.3–1.2)
Total Protein: 7.3 g/dL (ref 6.5–8.1)

## 2022-10-28 NOTE — Progress Notes (Signed)
[facility] [phone]   [phone]  PROGRESS NOTE  Patient Care Team: Pincus Sanes, MD as PCP - General (Internal Medicine) Kathyrn Sheriff, [facility] as Pharmacist (Pharmacist) Gelene Mink, OD as Referring Physician (Optometry)  Hematological/Oncological History # Anemia in the Setting of Chronic Kidney Disease  05/19/2022: last visit with Tina Molina 07/08/2022: establish care with Dr. Leonides Molina   CHIEF COMPLAINTS/PURPOSE OF CONSULTATION:  "Anemia 2/2 to CKD "  HISTORY OF PRESENTING ILLNESS:  Tina Molina 77 y.o. female with medical history significant for anemia secondary to CKD presents for a follow up. She was last seen by Dr. Leonides Molina on 09/14/2022. In the interim, she received her aranesp injection last on 09/14/2022.   On exam today Tina Molina reports she has been well overall in the interim since her last visit.  She has not required to the last 3 injections she was scheduled for.  She reports her energy levels "still need to work".  Her energy about a 5 out of 10.  She has not been having any bleeding, bruising, or dark stools.  She notes has not had any infectious symptoms such as cough, sore throat, or runny nose.  She notes that she is drinking plenty of water and eating well.  She has a strong appetite.  Overall she feels well and has no questions concerns or complaints today.. She denies fevers, chills, sweats, chest pain, cough, nausea, vomiting or diarrhea. Rest of the 10 point ROS is below.    MEDICAL HISTORY:  Past Medical History:  Diagnosis Date   Anemia    Associated with leukopenia and lymphocytosis. This is been evaluated by Dr. Myna Molina . Her platelet count has been normal   Arthritis    Chronic back pain    Chronic kidney disease    Erythropoietin deficiency anemia 02/23/2016   GERD (gastroesophageal reflux disease)    Hypertension    Lymphocytosis    MGUS (monoclonal gammopathy of unknown significance) 08/03/2012   IgA  792 11/04/11   Pneumonia 04/2010   OP   Renal insufficiency    Sarcoidosis    Thyroid disease     SURGICAL HISTORY: Past Surgical History:  Procedure Laterality Date   CARPAL TUNNEL RELEASE     Bilateral    CATARACT EXTRACTION Right 05/2015   CATARACT EXTRACTION Left    CERVICAL DISCECTOMY  06/2010   Dr.Pool   CHOLECYSTECTOMY     COLONOSCOPY  2010   POSTERIOR LUMBAR FUSION  02/29/2012   Dr Kathlen Brunswick op respiratory & renal compromise   THYROIDECTOMY  2003   For goiter   UPPER GASTROINTESTINAL ENDOSCOPY  12/01/2011   DB    SOCIAL HISTORY: Social History   Socioeconomic History   Marital status: Divorced    Spouse name: Not on file   Number of children: 0   Years of education: 12   Highest education level: Not on file  Occupational History   Occupation: Retired    Associate Professor: RETIRED  Tobacco Use   Smoking status: Former    Packs/day: 0.50    Years: 20.00    Additional pack years: 0.00    Total pack years: 10.00    Types: Cigarettes    Quit date: 05/17/1997    Years since quitting: 25.4   Smokeless tobacco: Never  Vaping Use   Vaping Use: Never used  Substance and Sexual Activity   Alcohol use: Yes    Alcohol/week: 1.0 standard drink of alcohol    Types:  1 Glasses of wine per week    Comment: occasionally , less than weekly   Drug use: No   Sexual activity: Not Currently  Other Topics Concern   Not on file  Social History Narrative   Patient is divorced and lives with her sister. Patient is retired.   Caffeine- sometimes - one cup   Right handed.   Social Determinants of Health   Financial Resource Strain: Low Risk  (08/18/2021)   Overall Financial Resource Strain (CARDIA)    Difficulty of Paying Living Expenses: Not hard at all  Food Insecurity: No Food Insecurity (08/18/2021)   Hunger Vital Sign    Worried About Running Out of Food in the Last Year: Never true    Ran Out of Food in the Last Year: Never true  Transportation Needs: No Transportation Needs  (08/18/2021)   PRAPARE - Administrator, Civil Service (Medical): No    Lack of Transportation (Non-Medical): No  Physical Activity: Inactive (08/18/2021)   Exercise Vital Sign    Days of Exercise per Week: 0 days    Minutes of Exercise per Session: 0 min  Stress: No Stress Concern Present (08/18/2021)   [employer] of Occupational Health - Occupational Stress Questionnaire    Feeling of Stress : Not at all  Social Connections: Moderately Integrated (08/18/2021)   Social Connection and Isolation Panel [NHANES]    Frequency of Communication with Friends and Family: More than three times a week    Frequency of Social Gatherings with Friends and Family: More than three times a week    Attends Religious Services: More than 4 times per year    Active Member of [employer] or Organizations: Yes    Attends Engineer, structural: More than 4 times per year    Marital Status: Divorced  Intimate Partner Violence: Not At Risk (08/18/2021)   Humiliation, Afraid, Rape, and Kick questionnaire    Fear of Current or Ex-Partner: No    Emotionally Abused: No    Physically Abused: No    Sexually Abused: No    FAMILY HISTORY: Family History  Problem Relation Age of Onset   Diabetes Mother    Arthritis Mother    Glaucoma Mother    Heart disease Mother 67       CHF and apparent stenting.   Anemia Father    Arthritis Father    Heart failure Father 49       No CAD   Hypertension Sister    Kidney disease Sister    Anemia Sister    Hypertension Brother    Anemia Brother    Kidney disease Brother    Arthritis Paternal Grandmother    Diabetes Maternal Aunt    Colon cancer Neg Hx    Colon polyps Neg Hx    Esophageal cancer Neg Hx    Rectal cancer Neg Hx    Stomach cancer Neg Hx     ALLERGIES:  is allergic to penicillins, shellfish allergy, shellfish-derived products, codeine, infliximab, sulfonamide derivatives, azithromycin, and iodine.  MEDICATIONS:  Current Outpatient  Medications  Medication Sig Dispense Refill   aspirin 81 MG tablet Take 81 mg by mouth daily.     atorvastatin (LIPITOR) 20 MG tablet TAKE ONE TABLET BY MOUTH ONCE DAILY 90 tablet 1   budesonide-formoterol (SYMBICORT) 160-4.5 MCG/ACT inhaler inhale 2 puffs if needed for wheezing 10.2 g 0   carvedilol (COREG) 25 MG tablet TAKE ONE TABLET BY MOUTH TWICE DAILY 180 tablet 1  cyanocobalamin 100 MCG tablet Take 100 mcg by mouth daily.     cyclobenzaprine (FLEXERIL) 10 MG tablet TAKE ONE TABLET BY MOUTH THREE TIMES DAILY 270 tablet 1   famotidine (PEPCID) 40 MG tablet TAKE ONE TABLET BY MOUTH TWICE DAILY 180 tablet 2   furosemide (LASIX) 20 MG tablet TAKE ONE TABLET BY MOUTH ONCE DAILY 90 tablet 2   gabapentin (NEURONTIN) 300 MG capsule TAKE ONE CAPSULE BY MOUTH THREE TIMES DAILY 270 capsule 1   hydrALAZINE (APRESOLINE) 50 MG tablet TAKE ONE TABLET BY MOUTH THREE TIMES DAILY 270 tablet 1   latanoprost (XALATAN) 0.005 % ophthalmic solution 1 drop at bedtime.     leflunomide (ARAVA) 20 MG tablet Take 20 mg by mouth daily.     levothyroxine (SYNTHROID) 175 MCG tablet Take 1 tablet (175 mcg total) by mouth daily. 90 tablet 3   MELATONIN GUMMIES PO Take by mouth at bedtime as needed.     Multiple Vitamin (MULTIVITAMIN WITH MINERALS) TABS Take 1 tablet by mouth daily.     spironolactone (ALDACTONE) 25 MG tablet TAKE ONE TABLET BY MOUTH ONCE DAILY 90 tablet 2   telmisartan (MICARDIS) 20 MG tablet Take 1 tablet (20 mg total) by mouth daily. 90 tablet 3   triamcinolone ointment (KENALOG) 0.5 % APPLY TO THE AFFECTED AREA TWICE DAILY USE FOR UPTO 14 DAYS 15 g 0   vitamin E 400 UNIT capsule Take 400 Units by mouth daily.      No current facility-administered medications for this visit.    REVIEW OF SYSTEMS:   Constitutional: ( - ) fevers, ( - )  chills , ( - ) night sweats Eyes: ( - ) blurriness of vision, ( - ) double vision, ( - ) watery eyes Ears, nose, mouth, throat, and face: ( - ) mucositis, ( - )  sore throat Respiratory: ( - ) cough, ( - ) dyspnea, ( - ) wheezes Cardiovascular: ( - ) palpitation, ( - ) chest discomfort, ( - ) lower extremity swelling Gastrointestinal:  ( - ) nausea, ( - ) heartburn, ( - ) change in bowel habits Skin: ( - ) abnormal skin rashes Lymphatics: ( - ) new lymphadenopathy, ( - ) easy bruising Neurological: ( - ) numbness, ( - ) tingling, ( - ) new weaknesses Behavioral/Psych: ( - ) mood change, ( - ) new changes  All other systems were reviewed with the patient and are negative.  PHYSICAL EXAMINATION:  Vitals:   10/28/22 1103  BP: (!) 146/72  Pulse: 96  Resp: 17  Temp: (!) 97.2 F (36.2 C)  SpO2: 95%    Filed Weights   10/28/22 1103  Weight: 203 lb 6.4 oz (92.3 kg)    GENERAL: well appearing elderly African-American female in NAD  SKIN: skin color, texture, turgor are normal, no rashes or significant lesions EYES: conjunctiva are pink and non-injected, sclera clear LUNGS: clear to auscultation and percussion with normal breathing effort HEART: regular rate & rhythm and no murmurs and no lower extremity edema Musculoskeletal: no cyanosis of digits and no clubbing  PSYCH: Molina & oriented x 3, fluent speech NEURO: no focal motor/sensory deficits  LABORATORY DATA:  I have reviewed the data as listed    Latest Ref Rng & Units 10/28/2022   10:38 AM 10/14/2022   11:52 AM 09/30/2022   11:36 AM  CBC  WBC 4.0 - 10.5 K/uL 5.0  3.6  5.1   Hemoglobin 12.0 - 15.0 g/dL 16.1  09.6  10.3   Hematocrit 36.0 - 46.0 % 31.4  34.1  31.9   Platelets 150 - 400 K/uL 157  173  178        Latest Ref Rng & Units 10/28/2022   10:38 AM 10/14/2022   11:52 AM 09/30/2022   11:36 AM  CMP  Glucose 70 - 99 mg/dL 161  096  045   BUN 8 - 23 mg/dL 33  41  37   Creatinine 0.44 - 1.00 mg/dL 4.09  8.11  9.14   Sodium 135 - 145 mmol/L 143  142  144   Potassium 3.5 - 5.1 mmol/L 4.3  4.4  4.5   Chloride 98 - 111 mmol/L 109  109  110   CO2 22 - 32 mmol/L 26  27  27     Calcium 8.9 - 10.3 mg/dL 9.0  8.8  8.5   Total Protein 6.5 - 8.1 g/dL 7.3  7.4  7.0   Total Bilirubin 0.3 - 1.2 mg/dL 0.4  0.4  0.3   Alkaline Phos 38 - 126 U/L 63  66  68   AST 15 - 41 U/L 17  19  17    ALT 0 - 44 U/L 11  11  13       ASSESSMENT & PLAN Tina Molina 77 y.o. female with medical history significant for anemia secondary to CKD who presents for transferring care from [facility].  After review of the labs, review of the records, and discussion with the patient the patients findings are most consistent with anemia secondary to chronic kidney disease.  # Anemia in the Setting of Chronic Kidney Disease  --currently managed on Aranesp 300 mcg SQ q 14 days to maintain Hgb >10 --Recommend Aranesp shot if Hgb is <10.  -- labs today show White blood cell 5.0, Hgb 10.6, MCV 98.1, Plt 157. Iron levels show no deficiency.  --RTC in 3 months with q 4 week labs and injection   No orders of the defined types were placed in this encounter.   All questions were answered. The patient knows to call the clinic with any problems, questions or concerns.  A total of more than 25 minutes were spent on this encounter with face-to-face time and non-face-to-face time, including preparing to see the patient, ordering tests and/or medications, counseling the patient and coordination of care as outlined above.   Ulysees Barns, MD Department of Hematology/Oncology [facility] Phone: [phone] Pager: [phone] Email: [email or web address]  10/28/2022 11:47 AM

## 2022-11-02 ENCOUNTER — Ambulatory Visit (HOSPITAL_COMMUNITY): Payer: Medicare Other | Attending: Cardiology

## 2022-11-02 DIAGNOSIS — R0602 Shortness of breath: Secondary | ICD-10-CM

## 2022-11-02 LAB — ECHOCARDIOGRAM COMPLETE
Area-P 1/2: 4.6 cm2
S' Lateral: 2.3 cm

## 2022-11-04 ENCOUNTER — Telehealth: Payer: Self-pay | Admitting: Cardiology

## 2022-11-04 DIAGNOSIS — M48062 Spinal stenosis, lumbar region with neurogenic claudication: Secondary | ICD-10-CM | POA: Diagnosis not present

## 2022-11-04 DIAGNOSIS — R0602 Shortness of breath: Secondary | ICD-10-CM

## 2022-11-04 NOTE — Telephone Encounter (Signed)
Patient aware of echo results  Tina Molina can you order her scan. I tried but we were not sure if that was the right order. Thank you

## 2022-11-04 NOTE — Telephone Encounter (Signed)
Patient is returning call in regards to results. Requesting call back. 

## 2022-11-05 NOTE — Telephone Encounter (Signed)
Order placed for lexiscan.

## 2022-11-08 ENCOUNTER — Encounter (HOSPITAL_COMMUNITY): Payer: Self-pay | Admitting: Cardiology

## 2022-11-08 ENCOUNTER — Telehealth (HOSPITAL_COMMUNITY): Payer: Self-pay

## 2022-11-08 NOTE — Telephone Encounter (Signed)
Patient given detailed instructions by L. Welch per Myocardial Perfusion Study Information Sheet for the test on 6/27 at 1245. Patient notified to arrive 15 minutes early and that it is imperative to arrive on time for appointment to keep from having the test rescheduled.  If you need to cancel or reschedule your appointment, please call the office within 24 hours of your appointment. . Patient verbalized understanding. TMY

## 2022-11-09 DIAGNOSIS — H40053 Ocular hypertension, bilateral: Secondary | ICD-10-CM | POA: Diagnosis not present

## 2022-11-10 ENCOUNTER — Ambulatory Visit (INDEPENDENT_AMBULATORY_CARE_PROVIDER_SITE_OTHER): Payer: Medicare Other

## 2022-11-10 DIAGNOSIS — Z0000 Encounter for general adult medical examination without abnormal findings: Secondary | ICD-10-CM

## 2022-11-10 NOTE — Patient Instructions (Signed)

## 2022-11-10 NOTE — Progress Notes (Signed)
Subjective:   Tina Molina is a 77 y.o. female who presents for Medicare Annual (Subsequent) preventive examination.  Visit Complete: Virtual  I connected with  Tina Molina on 11/10/22 by a audio enabled telemedicine application and verified that I am speaking with the correct person using two identifiers.  Patient Location: Home  Provider Location: Home Office  I discussed the limitations of evaluation and management by telemedicine. The patient expressed understanding and agreed to proceed.  Patient Medicare AWV questionnaire was completed by the patient on 11/10/2022; I have confirmed that all information answered by patient is correct and no changes since this date. Cardiac Risk Factors include: advanced age (>44men, >62 women);hypertension;dyslipidemia;obesity (BMI >30kg/m2)     Objective:    Today's Vitals   11/10/22 1137  PainSc: 5    There is no height or weight on file to calculate BMI.     11/10/2022   11:44 AM 05/19/2022    1:15 PM 04/07/2022    1:00 PM 04/07/2022   12:58 PM 03/08/2022    1:28 PM 02/08/2022    1:41 PM 01/15/2022    1:31 PM  Advanced Directives  Does Patient Have a Medical Advance Directive? No No No No No No No  Would patient like information on creating a medical advance directive? No - Patient declined No - Patient declined No - Patient declined No - Patient declined No - Patient declined No - Patient declined No - Patient declined    Current Medications (verified) Outpatient Encounter Medications as of 11/10/2022  Medication Sig   aspirin 81 MG tablet Take 81 mg by mouth daily.   atorvastatin (LIPITOR) 20 MG tablet TAKE ONE TABLET BY MOUTH ONCE DAILY   budesonide-formoterol (SYMBICORT) 160-4.5 MCG/ACT inhaler inhale 2 puffs if needed for wheezing   carvedilol (COREG) 25 MG tablet TAKE ONE TABLET BY MOUTH TWICE DAILY   cyanocobalamin 100 MCG tablet Take 100 mcg by mouth daily.   cyclobenzaprine (FLEXERIL) 10 MG tablet TAKE ONE TABLET BY  MOUTH THREE TIMES DAILY   famotidine (PEPCID) 40 MG tablet TAKE ONE TABLET BY MOUTH TWICE DAILY   furosemide (LASIX) 20 MG tablet TAKE ONE TABLET BY MOUTH ONCE DAILY   gabapentin (NEURONTIN) 300 MG capsule TAKE ONE CAPSULE BY MOUTH THREE TIMES DAILY   hydrALAZINE (APRESOLINE) 50 MG tablet TAKE ONE TABLET BY MOUTH THREE TIMES DAILY   latanoprost (XALATAN) 0.005 % ophthalmic solution 1 drop at bedtime.   levothyroxine (SYNTHROID) 175 MCG tablet Take 1 tablet (175 mcg total) by mouth daily.   MELATONIN GUMMIES PO Take by mouth at bedtime as needed.   Multiple Vitamin (MULTIVITAMIN WITH MINERALS) TABS Take 1 tablet by mouth daily.   spironolactone (ALDACTONE) 25 MG tablet TAKE ONE TABLET BY MOUTH ONCE DAILY   telmisartan (MICARDIS) 20 MG tablet Take 1 tablet (20 mg total) by mouth daily.   triamcinolone ointment (KENALOG) 0.5 % APPLY TO THE AFFECTED AREA TWICE DAILY USE FOR UPTO 14 DAYS   vitamin E 400 UNIT capsule Take 400 Units by mouth daily.    leflunomide (ARAVA) 20 MG tablet Take 20 mg by mouth daily.   [DISCONTINUED] modafinil (PROVIGIL) 100 MG tablet Take 100 mg by mouth daily.   [DISCONTINUED] potassium chloride (MICRO-K) 10 MEQ CR capsule Take 1 capsule (10 mEq total) by mouth 2 (two) times daily.   No facility-administered encounter medications on file as of 11/10/2022.    Allergies (verified) Penicillins, Shellfish allergy, Shellfish-derived products, Codeine, Infliximab, Sulfonamide derivatives, Azithromycin,  and Iodine   History: Past Medical History:  Diagnosis Date   Anemia    Associated with leukopenia and lymphocytosis. This is been evaluated by Dr. Myna Hidalgo . Her platelet count has been normal   Arthritis    Chronic back pain    Chronic kidney disease    Erythropoietin deficiency anemia 02/23/2016   GERD (gastroesophageal reflux disease)    Hypertension    Lymphocytosis    MGUS (monoclonal gammopathy of unknown significance) 08/03/2012   IgA 792 11/04/11   Pneumonia  04/2010   OP   Renal insufficiency    Sarcoidosis    Thyroid disease    Past Surgical History:  Procedure Laterality Date   CARPAL TUNNEL RELEASE     Bilateral    CATARACT EXTRACTION Right 05/2015   CATARACT EXTRACTION Left    CERVICAL DISCECTOMY  06/2010   Dr.Pool   CHOLECYSTECTOMY     COLONOSCOPY  2010   POSTERIOR LUMBAR FUSION  02/29/2012   Dr Kathlen Brunswick op respiratory & renal compromise   THYROIDECTOMY  2003   For goiter   UPPER GASTROINTESTINAL ENDOSCOPY  12/01/2011   DB   Family History  Problem Relation Age of Onset   Diabetes Mother    Arthritis Mother    Glaucoma Mother    Heart disease Mother 73       CHF and apparent stenting.   Anemia Father    Arthritis Father    Heart failure Father 38       No CAD   Hypertension Sister    Kidney disease Sister    Anemia Sister    Hypertension Brother    Anemia Brother    Kidney disease Brother    Arthritis Paternal Grandmother    Diabetes Maternal Aunt    Colon cancer Neg Hx    Colon polyps Neg Hx    Esophageal cancer Neg Hx    Rectal cancer Neg Hx    Stomach cancer Neg Hx    Social History   Socioeconomic History   Marital status: Divorced    Spouse name: Not on file   Number of children: 0   Years of education: 12   Highest education level: Not on file  Occupational History   Occupation: Retired    Associate Professor: RETIRED  Tobacco Use   Smoking status: Former    Packs/day: 0.50    Years: 20.00    Additional pack years: 0.00    Total pack years: 10.00    Types: Cigarettes    Quit date: 05/17/1997    Years since quitting: 25.5   Smokeless tobacco: Never  Vaping Use   Vaping Use: Never used  Substance and Sexual Activity   Alcohol use: Yes    Alcohol/week: 1.0 standard drink of alcohol    Types: 1 Glasses of wine per week    Comment: occasionally , less than weekly   Drug use: No   Sexual activity: Not Currently  Other Topics Concern   Not on file  Social History Narrative   Patient is divorced and  lives with her sister. Patient is retired.   Caffeine- sometimes - one cup   Right handed.   Social Determinants of Health   Financial Resource Strain: Low Risk  (11/10/2022)   Overall Financial Resource Strain (CARDIA)    Difficulty of Paying Living Expenses: Not hard at all  Food Insecurity: No Food Insecurity (11/10/2022)   Hunger Vital Sign    Worried About Running Out of Food in the  Last Year: Never true    Ran Out of Food in the Last Year: Never true  Transportation Needs: No Transportation Needs (11/10/2022)   PRAPARE - Administrator, Civil Service (Medical): No    Lack of Transportation (Non-Medical): No  Physical Activity: Insufficiently Active (11/10/2022)   Exercise Vital Sign    Days of Exercise per Week: 2 days    Minutes of Exercise per Session: 20 min  Stress: No Stress Concern Present (08/18/2021)   [employer] of Occupational Health - Occupational Stress Questionnaire    Feeling of Stress : Not at all  Social Connections: Moderately Integrated (11/10/2022)   Social Connection and Isolation Panel [NHANES]    Frequency of Communication with Friends and Family: More than three times a week    Frequency of Social Gatherings with Friends and Family: More than three times a week    Attends Religious Services: More than 4 times per year    Active Member of [employer] or Organizations: Yes    Attends Engineer, structural: More than 4 times per year    Marital Status: Divorced    Tobacco Counseling Counseling given: Not Answered   Clinical Intake:  Pre-visit preparation completed: Yes  Pain : 0-10 Pain Score: 5  Pain Location: Knee     Diabetes: No  How often do you need to have someone help you when you read instructions, pamphlets, or other written materials from your doctor or pharmacy?: 1 - Never What is the last grade level you completed in school?: 12th grade diploma  Interpreter Needed?: No      Activities of Daily Living     11/10/2022   11:38 AM  In your present state of health, do you have any difficulty performing the following activities:  Hearing? 0  Vision? 0  Difficulty concentrating or making decisions? 0  Walking or climbing stairs? 0  Dressing or bathing? 0  Doing errands, shopping? 0  Preparing Food and eating ? N  Using the Toilet? N  In the past six months, have you accidently leaked urine? N  Do you have problems with loss of bowel control? N  Managing your Medications? N  Managing your Finances? N  Housekeeping or managing your Housekeeping? N    Patient Care Team: Pincus Sanes, MD as PCP - General (Internal Medicine) Kathyrn Sheriff, [facility] (Inactive) as Pharmacist (Pharmacist) Gelene Mink, OD as Referring Physician (Optometry)  Indicate any recent Medical Services you may have received from other than [facility] providers in the past year (date may be approximate).     Assessment:   This is a routine wellness examination for Tina Molina.  Hearing/Vision screen No results found.  Dietary issues and exercise activities discussed:     Goals Addressed             This Visit's Progress    Activity and Exercise Increased       Evidence-based guidance:  Review current exercise levels.  Assess patient perspective on exercise or activity level, barriers to increasing activity, motivation and readiness for change.  Recommend or set healthy exercise goal based on individual tolerance.  Encourage small steps toward making change in amount of exercise or activity.  Urge reduction of sedentary activities or screen time.  Promote group activities within the community or with family or support person.  Consider referral to rehabiliation therapist for assessment and exercise/activity plan.   Notes:       Depression Screen  11/10/2022   11:42 AM 02/23/2022    2:34 PM 08/18/2021    1:56 PM 08/18/2021    1:21 PM 05/13/2020   11:32 AM 02/15/2020    1:24 PM 02/12/2019   10:56 AM  PHQ 2/9  Scores  PHQ - 2 Score 0 0 0 0 0 0 0  PHQ- 9 Score 0          Fall Risk    11/10/2022   11:44 AM 02/23/2022    2:34 PM 08/18/2021    1:55 PM 08/18/2021    1:21 PM 05/13/2020   11:27 AM  Fall Risk   Falls in the past year? 0 0 1 1 1   Number falls in past yr: 0 0 1 1 0  Injury with Fall? 0 0 0 0 0  Risk for fall due to : No Fall Risks No Fall Risks No Fall Risks    Follow up Falls evaluation completed Falls evaluation completed Falls evaluation completed Falls prevention discussed Falls evaluation completed    MEDICARE RISK AT HOME:  Medicare Risk at Home - 11/10/22 1145     Any stairs in or around the home? Yes    If so, are there any without handrails? No    Home free of loose throw rugs in walkways, pet beds, electrical cords, etc? No    Adequate lighting in your home to reduce risk of falls? No    Life Molina? No    Use of a cane, walker or w/c? No    Grab bars in the bathroom? No    Shower chair or bench in shower? Yes    Elevated toilet seat or a handicapped toilet? Yes             TIMED UP AND GO:  Was the test performed?  No    Cognitive Function:    08/18/2021    1:21 PM 04/20/2017    2:36 PM  MMSE - Mini Mental State Exam  Orientation to time 5 5  Orientation to Place 5 5  Registration 3 3  Attention/ Calculation 5 5  Recall 3 1  Language- name 2 objects 2 2  Language- repeat 1 1  Language- follow 3 step command 3 3  Language- read & follow direction 1 1  Write a sentence 1 1  Copy design 1 1  Total score 30 28        11/10/2022   11:45 AM  6CIT Screen  What time? 0 points  Count back from 20 0 points  Months in reverse 0 points  Repeat phrase 6 points    Immunizations Immunization History  Administered Date(s) Administered   Fluad Quad(high Dose 65+) 02/12/2019, 02/15/2020, 02/16/2021, 02/12/2022   Influenza Whole 01/20/2010   Influenza, High Dose Seasonal PF 03/01/2013, 03/13/2015, 04/20/2017, 03/30/2018   Influenza,inj,Quad PF,6+ Mos  04/05/2016   PFIZER(Purple Top)SARS-COV-2 Vaccination 07/26/2019, 08/16/2019, 09/19/2020   Pfizer Covid-19 Vaccine Bivalent Booster 57yrs & up 02/17/2021, 02/12/2022   Pneumococcal Conjugate-13 01/13/2015   Pneumococcal Polysaccharide-23 05/30/2013   Td 06/01/2009    TDAP status: Due, Education has been provided regarding the importance of this vaccine. Advised may receive this vaccine at local pharmacy or Health Dept. Aware to provide a copy of the vaccination record if obtained from local pharmacy or Health Dept. Verbalized acceptance and understanding.  Flu Vaccine status: Up to date  Pneumococcal vaccine status: Up to date  Covid-19 vaccine status: Completed vaccines  Qualifies for Shingles Vaccine? Yes  Zostavax completed No   Shingrix Completed?: No.    Education has been provided regarding the importance of this vaccine. Patient has been advised to call insurance company to determine out of pocket expense if they have not yet received this vaccine. Advised may also receive vaccine at local pharmacy or Health Dept. Verbalized acceptance and understanding.  Screening Tests Health Maintenance  Topic Date Due   DTaP/Tdap/Td (2 - Tdap) 06/02/2019   COVID-19 Vaccine (6 - 2023-24 season) 04/09/2022   INFLUENZA VACCINE  12/16/2022   DEXA SCAN  07/28/2023   Medicare Annual Wellness (AWV)  11/10/2023   Pneumonia Vaccine 86+ Years old  Completed   Hepatitis C Screening  Completed   HPV VACCINES  Aged Out   Colonoscopy  Discontinued   Zoster Vaccines- Shingrix  Discontinued    Health Maintenance  Health Maintenance Due  Topic Date Due   DTaP/Tdap/Td (2 - Tdap) 06/02/2019   COVID-19 Vaccine (6 - 2023-24 season) 04/09/2022    Colorectal cancer screening: Type of screening: Colonoscopy. Completed unknown. Repeat every 0 years  Mammogram status: Completed 10/22/2022. Repeat every year Bone Density status: Completed 07/28/2018. Results reflect: Bone density results: OSTEOPOROSIS.  Repeat every unknown years.   Lung Cancer Screening: (Low Dose CT Chest recommended if Age 78-80 years, 20 pack-year currently smoking OR have quit w/in 15years.) does not qualify.   Lung Cancer Screening Referral: na  Additional Screening:  Hepatitis C Screening: does qualify; Completed 02/23/22  Vision Screening: Recommended annual ophthalmology exams for early detection of glaucoma and other disorders of the eye. Is the patient up to date with their annual eye exam?  Yes  Who is the provider or what is the name of the office in which the patient attends annual eye exams? [facility] If pt is not established with a provider, would they like to be referred to a provider to establish care?  Established  .   Dental Screening: Recommended annual dental exams for proper oral hygiene  Diabetic Foot Exam:   Community Resource Referral / Chronic Care Management: CRR required this visit?  No   CCM required this visit?  No     Plan:     I have personally reviewed and noted the following in the patient's chart:   Medical and social history Use of alcohol, tobacco or illicit drugs  Current medications and supplements including opioid prescriptions. Patient is not currently taking opioid prescriptions. Functional ability and status Nutritional status Physical activity Advanced directives List of other physicians Hospitalizations, surgeries, and ER visits in previous 12 months Vitals Screenings to include cognitive, depression, and falls Referrals and appointments  In addition, I have reviewed and discussed with patient certain preventive protocols, quality metrics, and best practice recommendations. A written personalized care plan for preventive services as well as general preventive health recommendations were provided to patient.     Delana Meyer   11/10/2022   After Visit Summary: (MyChart) Due to this being a telephonic visit, the after visit summary with patients personalized  plan was offered to patient via MyChart   Nurse Notes:

## 2022-11-11 ENCOUNTER — Ambulatory Visit (HOSPITAL_COMMUNITY): Payer: Medicare Other | Attending: Cardiology

## 2022-11-11 DIAGNOSIS — R0602 Shortness of breath: Secondary | ICD-10-CM | POA: Diagnosis not present

## 2022-11-11 LAB — MYOCARDIAL PERFUSION IMAGING
LV dias vol: 50 mL (ref 46–106)
LV sys vol: 15 mL
Nuc Stress EF: 69 %
Peak HR: 100 {beats}/min
Rest HR: 84 {beats}/min
Rest Nuclear Isotope Dose: 11 mCi
SDS: 1
SRS: 0
SSS: 1
ST Depression (mm): 0 mm
Stress Nuclear Isotope Dose: 29.8 mCi
TID: 0.92

## 2022-11-11 MED ORDER — TECHNETIUM TC 99M TETROFOSMIN IV KIT
29.8000 | PACK | Freq: Once | INTRAVENOUS | Status: AC | PRN
  Administered 2022-11-11: 29.8 via INTRAVENOUS

## 2022-11-11 MED ORDER — TECHNETIUM TC 99M TETROFOSMIN IV KIT
11.0000 | PACK | Freq: Once | INTRAVENOUS | Status: AC | PRN
  Administered 2022-11-11: 11 via INTRAVENOUS

## 2022-11-11 MED ORDER — REGADENOSON 0.4 MG/5ML IV SOLN
0.4000 mg | Freq: Once | INTRAVENOUS | Status: AC
  Administered 2022-11-11: 0.4 mg via INTRAVENOUS

## 2022-11-11 NOTE — Progress Notes (Signed)
  Cardiology Office Note:   Date:  11/12/2022  ID:  Tina Molina, DOB July 31, 1945, MRN [id number] PCP: Pincus Sanes, MD  [facility] Providers Cardiologist:  Rollene Rotunda, MD {  History of Present Illness:   Tina Molina is a 77 y.o. female  who presents for evaluation of SOB.  I saw her in 2014 for evaluation of difficult to control HTN.  An echocardiogram from around that time that was unremarkable.  Otherwise she has not had any prior cardiac testing.    I saw her in May and she had SOB.  proBNP was normal.  She had some mild LVH.  Otherwise the echo was unremarkable.  Perfusion study was low risk and demonstrated no evidence of ischemia.   She had no new problems.  She still getting short of breath walking short distance on level ground but she is not having any resting shortness of breath and she denies any PND or orthopnea.  She has had no palpitations, presyncope or syncope.  She denies any chest pressure, neck or arm discomfort.  She has had no weight gain or edema.  S  ROS: As stated in the HPI and negative for all other systems.  Studies Reviewed:    EKG:   NA  Risk Assessment/Calculations:          Physical Exam:   VS:  BP (!) 144/72 (BP Location: Left Arm, Patient Position: Sitting, Cuff Size: Large)   Pulse 92   Ht 5\' 4"  (1.626 m)   Wt 205 lb 3.2 oz (93.1 kg)   SpO2 94%   BMI 35.22 kg/m    Wt Readings from Last 3 Encounters:  11/12/22 205 lb 3.2 oz (93.1 kg)  11/11/22 203 lb (92.1 kg)  10/28/22 203 lb 6.4 oz (92.3 kg)     GEN: Well nourished, well developed in no acute distress NECK: No JVD; No carotid bruits CARDIAC: RRR, no murmurs, rubs, gallops RESPIRATORY:  Clear to auscultation without rales, wheezing or rhonchi  ABDOMEN: Soft, non-tender, non-distended EXTREMITIES:  No edema; No deformity   ASSESSMENT AND PLAN:   SOB: This might be related to weight and deconditioning.  She had an extensive workup.  No further workup is  indicated.  HTN: The blood pressure is mildly elevated today but this is unusual she says.  They will keep an eye on it at home.  She will continue the meds as listed.   Anemia chronic: Hemoglobin is followed by hematology no change in therapy.    CKD 3B: Creatinine was 2.08 and she is followed by nephrology.       Follow up with me as needed.   Signed, Rollene Rotunda, MD

## 2022-11-12 ENCOUNTER — Ambulatory Visit: Payer: Medicare Other | Attending: Cardiology | Admitting: Cardiology

## 2022-11-12 ENCOUNTER — Encounter: Payer: Self-pay | Admitting: Cardiology

## 2022-11-12 VITALS — BP 144/72 | HR 92 | Ht 64.0 in | Wt 205.2 lb

## 2022-11-12 DIAGNOSIS — R0602 Shortness of breath: Secondary | ICD-10-CM | POA: Diagnosis not present

## 2022-11-12 DIAGNOSIS — N1832 Chronic kidney disease, stage 3b: Secondary | ICD-10-CM | POA: Diagnosis not present

## 2022-11-12 DIAGNOSIS — I10 Essential (primary) hypertension: Secondary | ICD-10-CM | POA: Diagnosis not present

## 2022-11-12 NOTE — Patient Instructions (Signed)
Medication Instructions:   Your physician recommends that you continue on your current medications as directed. Please refer to the Current Medication list given to you today.  *If you need a refill on your cardiac medications before your next appointment, please call your pharmacy*  Lab Work: NONE ordered at this time of appointment   If you have labs (blood work) drawn today and your tests are completely normal, you will receive your results only by: MyChart Message (if you have MyChart) OR A paper copy in the mail If you have any lab test that is abnormal or we need to change your treatment, we will call you to review the results.  Testing/Procedures: NONE ordered at this time of appointment   Follow-Up: At [facility], you and your health needs are our priority.  As part of our continuing mission to provide you with exceptional heart care, we have created designated Provider Care Teams.  These Care Teams include your primary Cardiologist (physician) and Advanced Practice Providers (APPs -  Physician Assistants and Nurse Practitioners) who all work together to provide you with the care you need, when you need it.    Your next appointment:   As needed if symptoms worsen, fail to improve and/or develop new symptoms    Provider:   Rollene Rotunda, MD     Other Instructions

## 2022-11-25 ENCOUNTER — Inpatient Hospital Stay: Payer: Medicare Other | Attending: Hematology & Oncology

## 2022-11-25 ENCOUNTER — Inpatient Hospital Stay: Payer: Medicare Other

## 2022-11-25 VITALS — BP 172/85 | HR 108 | Temp 98.5°F | Resp 18

## 2022-11-25 DIAGNOSIS — D631 Anemia in chronic kidney disease: Secondary | ICD-10-CM

## 2022-11-25 DIAGNOSIS — N182 Chronic kidney disease, stage 2 (mild): Secondary | ICD-10-CM | POA: Diagnosis not present

## 2022-11-25 DIAGNOSIS — D508 Other iron deficiency anemias: Secondary | ICD-10-CM

## 2022-11-25 LAB — CMP (CANCER CENTER ONLY)
ALT: 13 U/L (ref 0–44)
AST: 16 U/L (ref 15–41)
Albumin: 3.9 g/dL (ref 3.5–5.0)
Alkaline Phosphatase: 63 U/L (ref 38–126)
Anion gap: 5 (ref 5–15)
BUN: 41 mg/dL — ABNORMAL HIGH (ref 8–23)
CO2: 27 mmol/L (ref 22–32)
Calcium: 8.9 mg/dL (ref 8.9–10.3)
Chloride: 110 mmol/L (ref 98–111)
Creatinine: 2 mg/dL — ABNORMAL HIGH (ref 0.44–1.00)
GFR, Estimated: 25 mL/min — ABNORMAL LOW (ref >60)
Glucose, Bld: 95 mg/dL (ref 70–99)
Potassium: 4.3 mmol/L (ref 3.5–5.1)
Sodium: 142 mmol/L (ref 135–145)
Total Bilirubin: 0.4 mg/dL (ref 0.3–1.2)
Total Protein: 7.1 g/dL (ref 6.5–8.1)

## 2022-11-25 LAB — CBC WITH DIFFERENTIAL (CANCER CENTER ONLY)
Abs Immature Granulocytes: 0 10*3/uL (ref 0.00–0.07)
Basophils Absolute: 0 10*3/uL (ref 0.0–0.1)
Basophils Relative: 1 %
Eosinophils Absolute: 0.1 10*3/uL (ref 0.0–0.5)
Eosinophils Relative: 3 %
HCT: 25.5 % — ABNORMAL LOW (ref 36.0–46.0)
Hemoglobin: 8.4 g/dL — ABNORMAL LOW (ref 12.0–15.0)
Immature Granulocytes: 0 %
Lymphocytes Relative: 40 %
Lymphs Abs: 1.7 10*3/uL (ref 0.7–4.0)
MCH: 33.3 pg (ref 26.0–34.0)
MCHC: 32.9 g/dL (ref 30.0–36.0)
MCV: 101.2 fL — ABNORMAL HIGH (ref 80.0–100.0)
Monocytes Absolute: 0.5 10*3/uL (ref 0.1–1.0)
Monocytes Relative: 12 %
Neutro Abs: 1.9 10*3/uL (ref 1.7–7.7)
Neutrophils Relative %: 44 %
Platelet Count: 145 10*3/uL — ABNORMAL LOW (ref 150–400)
RBC: 2.52 MIL/uL — ABNORMAL LOW (ref 3.87–5.11)
RDW: 14.6 % (ref 11.5–15.5)
WBC Count: 4.3 10*3/uL (ref 4.0–10.5)
nRBC: 0 % (ref 0.0–0.2)

## 2022-11-25 MED ORDER — DARBEPOETIN ALFA 300 MCG/0.6ML IJ SOSY
300.0000 ug | PREFILLED_SYRINGE | Freq: Once | INTRAMUSCULAR | Status: AC
  Administered 2022-11-25: 300 ug via SUBCUTANEOUS
  Filled 2022-11-25: qty 0.6

## 2022-12-22 ENCOUNTER — Telehealth: Payer: Self-pay | Admitting: Hematology and Oncology

## 2022-12-23 ENCOUNTER — Inpatient Hospital Stay: Payer: Medicare Other

## 2023-01-04 DIAGNOSIS — R5382 Chronic fatigue, unspecified: Secondary | ICD-10-CM | POA: Diagnosis not present

## 2023-01-04 DIAGNOSIS — M0589 Other rheumatoid arthritis with rheumatoid factor of multiple sites: Secondary | ICD-10-CM | POA: Diagnosis not present

## 2023-01-04 DIAGNOSIS — M5136 Other intervertebral disc degeneration, lumbar region: Secondary | ICD-10-CM | POA: Diagnosis not present

## 2023-01-04 DIAGNOSIS — D860 Sarcoidosis of lung: Secondary | ICD-10-CM | POA: Diagnosis not present

## 2023-01-04 DIAGNOSIS — E669 Obesity, unspecified: Secondary | ICD-10-CM | POA: Diagnosis not present

## 2023-01-04 DIAGNOSIS — N184 Chronic kidney disease, stage 4 (severe): Secondary | ICD-10-CM | POA: Diagnosis not present

## 2023-01-04 DIAGNOSIS — Z6834 Body mass index (BMI) 34.0-34.9, adult: Secondary | ICD-10-CM | POA: Diagnosis not present

## 2023-01-04 DIAGNOSIS — R21 Rash and other nonspecific skin eruption: Secondary | ICD-10-CM | POA: Diagnosis not present

## 2023-01-04 DIAGNOSIS — M25512 Pain in left shoulder: Secondary | ICD-10-CM | POA: Diagnosis not present

## 2023-01-04 DIAGNOSIS — M1991 Primary osteoarthritis, unspecified site: Secondary | ICD-10-CM | POA: Diagnosis not present

## 2023-01-12 DIAGNOSIS — M0589 Other rheumatoid arthritis with rheumatoid factor of multiple sites: Secondary | ICD-10-CM | POA: Diagnosis not present

## 2023-01-21 ENCOUNTER — Inpatient Hospital Stay: Payer: Medicare Other

## 2023-01-21 ENCOUNTER — Inpatient Hospital Stay: Payer: Medicare Other | Attending: Hematology & Oncology | Admitting: Hematology and Oncology

## 2023-01-21 VITALS — BP 138/73 | HR 91 | Temp 98.5°F | Resp 17 | Wt 202.7 lb

## 2023-01-21 DIAGNOSIS — D631 Anemia in chronic kidney disease: Secondary | ICD-10-CM

## 2023-01-21 DIAGNOSIS — N182 Chronic kidney disease, stage 2 (mild): Secondary | ICD-10-CM | POA: Diagnosis not present

## 2023-01-21 DIAGNOSIS — D508 Other iron deficiency anemias: Secondary | ICD-10-CM

## 2023-01-21 DIAGNOSIS — Z87891 Personal history of nicotine dependence: Secondary | ICD-10-CM | POA: Diagnosis not present

## 2023-01-21 LAB — CBC WITH DIFFERENTIAL (CANCER CENTER ONLY)
Abs Immature Granulocytes: 0.01 10*3/uL (ref 0.00–0.07)
Basophils Absolute: 0 10*3/uL (ref 0.0–0.1)
Basophils Relative: 1 %
Eosinophils Absolute: 0.2 10*3/uL (ref 0.0–0.5)
Eosinophils Relative: 5 %
HCT: 29.5 % — ABNORMAL LOW (ref 36.0–46.0)
Hemoglobin: 9.7 g/dL — ABNORMAL LOW (ref 12.0–15.0)
Immature Granulocytes: 0 %
Lymphocytes Relative: 60 %
Lymphs Abs: 2.5 10*3/uL (ref 0.7–4.0)
MCH: 33.1 pg (ref 26.0–34.0)
MCHC: 32.9 g/dL (ref 30.0–36.0)
MCV: 100.7 fL — ABNORMAL HIGH (ref 80.0–100.0)
Monocytes Absolute: 0.5 10*3/uL (ref 0.1–1.0)
Monocytes Relative: 13 %
Neutro Abs: 0.9 10*3/uL — ABNORMAL LOW (ref 1.7–7.7)
Neutrophils Relative %: 21 %
Platelet Count: 177 10*3/uL (ref 150–400)
RBC: 2.93 MIL/uL — ABNORMAL LOW (ref 3.87–5.11)
RDW: 13.4 % (ref 11.5–15.5)
WBC Count: 4.1 10*3/uL (ref 4.0–10.5)
nRBC: 0 % (ref 0.0–0.2)

## 2023-01-21 LAB — CMP (CANCER CENTER ONLY)
ALT: 9 U/L (ref 0–44)
AST: 15 U/L (ref 15–41)
Albumin: 4.1 g/dL (ref 3.5–5.0)
Alkaline Phosphatase: 57 U/L (ref 38–126)
Anion gap: 6 (ref 5–15)
BUN: 28 mg/dL — ABNORMAL HIGH (ref 8–23)
CO2: 27 mmol/L (ref 22–32)
Calcium: 9.1 mg/dL (ref 8.9–10.3)
Chloride: 108 mmol/L (ref 98–111)
Creatinine: 2.13 mg/dL — ABNORMAL HIGH (ref 0.44–1.00)
GFR, Estimated: 24 mL/min — ABNORMAL LOW (ref >60)
Glucose, Bld: 98 mg/dL (ref 70–99)
Potassium: 4.4 mmol/L (ref 3.5–5.1)
Sodium: 141 mmol/L (ref 135–145)
Total Bilirubin: 0.4 mg/dL (ref 0.3–1.2)
Total Protein: 7.2 g/dL (ref 6.5–8.1)

## 2023-01-21 MED ORDER — DARBEPOETIN ALFA 300 MCG/0.6ML IJ SOSY
300.0000 ug | PREFILLED_SYRINGE | Freq: Once | INTRAMUSCULAR | Status: AC
  Administered 2023-01-21: 300 ug via SUBCUTANEOUS
  Filled 2023-01-21: qty 0.6

## 2023-01-21 NOTE — Progress Notes (Signed)
[facility] [phone]   [phone]  PROGRESS NOTE  Patient Care Team: Tina Sanes, MD as PCP - General (Internal Medicine) Tina Rotunda, MD as PCP - Cardiology (Cardiology) Tina Molina, [facility] (Inactive) as Pharmacist (Pharmacist) Tina Molina, OD as Referring Physician (Optometry)  Hematological/Oncological History # Anemia in the Setting of Chronic Kidney Disease  05/19/2022: last visit with Tina Molina 07/08/2022: establish care with Dr. Leonides Molina   CHIEF COMPLAINTS/PURPOSE OF CONSULTATION:  "Anemia 2/2 to CKD "  HISTORY OF PRESENTING ILLNESS:  Tina Molina 77 y.o. female with medical history significant for anemia secondary to CKD presents for a follow up. She was last seen by Dr. Leonides Molina on 10/28/2022. In the interim, she received her aranesp injection last on 11/25/2022.   On exam today Tina Molina is accompanied by her sister.  She reports she has been well overall in the interim since our last visit.  She notes that she started Cimzia shots with her rheumatologist.  She is getting those every other week.  She notes that she still does not feel like she has much energy and currently ranks her energy is a 7 out of 10.  She is not having any lightheadedness, dizziness, or shortness of breath.  She reports that she was watching TV at 1 point did have 1 episode of vertigo.  She is not having any bleeding, bruising, or dark stools.  She reports that she is eating well and doing her best to try to drink plenty of water.  She has not had any recent illnesses such as runny nose, sore throat, or cough.  She is tolerating her darbepoetin shots quite well with no side effects or injection site reactions.  Overall she feels well and has no questions concerns or complaints today. She denies fevers, chills, sweats, chest pain, cough, nausea, vomiting or diarrhea. Rest of the 10 point ROS is below.    MEDICAL HISTORY:  Past Medical History:  Diagnosis  Date   Anemia    Associated with leukopenia and lymphocytosis. This is been evaluated by Dr. Myna Molina . Her platelet count has been normal   Arthritis    Chronic back pain    Chronic kidney disease    Erythropoietin deficiency anemia 02/23/2016   GERD (gastroesophageal reflux disease)    Hypertension    Lymphocytosis    MGUS (monoclonal gammopathy of unknown significance) 08/03/2012   IgA 792 11/04/11   Pneumonia 04/2010   OP   Renal insufficiency    Sarcoidosis    Thyroid disease     SURGICAL HISTORY: Past Surgical History:  Procedure Laterality Date   CARPAL TUNNEL RELEASE     Bilateral    CATARACT EXTRACTION Right 05/2015   CATARACT EXTRACTION Left    CERVICAL DISCECTOMY  06/2010   Tina Molina   CHOLECYSTECTOMY     COLONOSCOPY  2010   POSTERIOR LUMBAR FUSION  02/29/2012   Dr Tina Molina op respiratory & renal compromise   THYROIDECTOMY  2003   For goiter   UPPER GASTROINTESTINAL ENDOSCOPY  12/01/2011   DB    SOCIAL HISTORY: Social History   Socioeconomic History   Marital status: Divorced    Spouse name: Not on file   Number of children: 0   Years of education: 12   Highest education level: Not on file  Occupational History   Occupation: Retired    Associate Professor: RETIRED  Tobacco Use   Smoking status: Former    Current packs/day: 0.00  Average packs/day: 0.5 packs/day for 20.0 years (10.0 ttl pk-yrs)    Types: Cigarettes    Start date: 05/17/1977    Quit date: 05/17/1997    Years since quitting: 25.6   Smokeless tobacco: Never  Vaping Use   Vaping status: Never Used  Substance and Sexual Activity   Alcohol use: Yes    Alcohol/week: 1.0 standard drink of alcohol    Types: 1 Glasses of wine per week    Comment: occasionally , less than weekly   Drug use: No   Sexual activity: Not Currently  Other Topics Concern   Not on file  Social History Narrative   Patient is divorced and lives with her sister. Patient is retired.   Caffeine- sometimes - one cup   Right  handed.   Social Determinants of Health   Financial Resource Strain: Low Risk  (11/10/2022)   Overall Financial Resource Strain (CARDIA)    Difficulty of Paying Living Expenses: Not hard at all  Food Insecurity: No Food Insecurity (11/10/2022)   Hunger Vital Sign    Worried About Running Out of Food in the Last Year: Never true    Ran Out of Food in the Last Year: Never true  Transportation Needs: No Transportation Needs (11/10/2022)   PRAPARE - Administrator, Civil Service (Medical): No    Lack of Transportation (Non-Medical): No  Physical Activity: Insufficiently Active (11/10/2022)   Exercise Vital Sign    Days of Exercise per Week: 2 days    Minutes of Exercise per Session: 20 min  Stress: No Stress Concern Present (08/18/2021)   [employer] of Occupational Health - Occupational Stress Questionnaire    Feeling of Stress : Not at all  Social Connections: Moderately Integrated (11/10/2022)   Social Connection and Isolation Panel [NHANES]    Frequency of Communication with Friends and Family: More than three times a week    Frequency of Social Gatherings with Friends and Family: More than three times a week    Attends Religious Services: More than 4 times per year    Active Member of [employer] or Organizations: Yes    Attends Engineer, structural: More than 4 times per year    Marital Status: Divorced  Intimate Partner Violence: Not At Risk (11/10/2022)   Humiliation, Afraid, Rape, and Kick questionnaire    Fear of Current or Ex-Partner: No    Emotionally Abused: No    Physically Abused: No    Sexually Abused: No    FAMILY HISTORY: Family History  Problem Relation Age of Onset   Diabetes Mother    Arthritis Mother    Glaucoma Mother    Heart disease Mother 52       CHF and apparent stenting.   Anemia Father    Arthritis Father    Heart failure Father 103       No CAD   Hypertension Sister    Kidney disease Sister    Anemia Sister    Hypertension  Brother    Anemia Brother    Kidney disease Brother    Arthritis Paternal Grandmother    Diabetes Maternal Aunt    Colon cancer Neg Hx    Colon polyps Neg Hx    Esophageal cancer Neg Hx    Rectal cancer Neg Hx    Stomach cancer Neg Hx     ALLERGIES:  is allergic to penicillins, shellfish allergy, shellfish-derived products, codeine, infliximab, sulfonamide derivatives, azithromycin, and iodine.  MEDICATIONS:  Current  Outpatient Medications  Medication Sig Dispense Refill   aspirin 81 MG tablet Take 81 mg by mouth daily.     atorvastatin (LIPITOR) 20 MG tablet TAKE ONE TABLET BY MOUTH ONCE DAILY 90 tablet 1   budesonide-formoterol (SYMBICORT) 160-4.5 MCG/ACT inhaler inhale 2 puffs if needed for wheezing 10.2 g 0   carvedilol (COREG) 25 MG tablet TAKE ONE TABLET BY MOUTH TWICE DAILY 180 tablet 1   cyanocobalamin 100 MCG tablet Take 100 mcg by mouth daily.     cyclobenzaprine (FLEXERIL) 10 MG tablet TAKE ONE TABLET BY MOUTH THREE TIMES DAILY 270 tablet 1   famotidine (PEPCID) 40 MG tablet TAKE ONE TABLET BY MOUTH TWICE DAILY 180 tablet 2   furosemide (LASIX) 20 MG tablet TAKE ONE TABLET BY MOUTH ONCE DAILY 90 tablet 2   gabapentin (NEURONTIN) 300 MG capsule TAKE ONE CAPSULE BY MOUTH THREE TIMES DAILY 270 capsule 1   hydrALAZINE (APRESOLINE) 50 MG tablet TAKE ONE TABLET BY MOUTH THREE TIMES DAILY 270 tablet 1   latanoprost (XALATAN) 0.005 % ophthalmic solution 1 drop at bedtime.     leflunomide (ARAVA) 20 MG tablet Take 20 mg by mouth daily.     levothyroxine (SYNTHROID) 175 MCG tablet Take 1 tablet (175 mcg total) by mouth daily. 90 tablet 3   MELATONIN GUMMIES PO Take by mouth at bedtime as needed.     Multiple Vitamin (MULTIVITAMIN WITH MINERALS) TABS Take 1 tablet by mouth daily.     spironolactone (ALDACTONE) 25 MG tablet TAKE ONE TABLET BY MOUTH ONCE DAILY 90 tablet 2   telmisartan (MICARDIS) 20 MG tablet Take 1 tablet (20 mg total) by mouth daily. 90 tablet 3   triamcinolone  ointment (KENALOG) 0.5 % APPLY TO THE AFFECTED AREA TWICE DAILY USE FOR UPTO 14 DAYS 15 g 0   vitamin E 400 UNIT capsule Take 400 Units by mouth daily.      No current facility-administered medications for this visit.    REVIEW OF SYSTEMS:   Constitutional: ( - ) fevers, ( - )  chills , ( - ) night sweats Eyes: ( - ) blurriness of vision, ( - ) double vision, ( - ) watery eyes Ears, nose, mouth, throat, and face: ( - ) mucositis, ( - ) sore throat Respiratory: ( - ) cough, ( - ) dyspnea, ( - ) wheezes Cardiovascular: ( - ) palpitation, ( - ) chest discomfort, ( - ) lower extremity swelling Gastrointestinal:  ( - ) nausea, ( - ) heartburn, ( - ) change in bowel habits Skin: ( - ) abnormal skin rashes Lymphatics: ( - ) new lymphadenopathy, ( - ) easy bruising Neurological: ( - ) numbness, ( - ) tingling, ( - ) new weaknesses Behavioral/Psych: ( - ) mood change, ( - ) new changes  All other systems were reviewed with the patient and are negative.  PHYSICAL EXAMINATION:  Vitals:   01/21/23 1417  BP: 138/73  Pulse: 91  Resp: 17  Temp: 98.5 F (36.9 C)  SpO2: 99%     Filed Weights   01/21/23 1417  Weight: 202 lb 11.2 oz (91.9 kg)     GENERAL: well appearing elderly African-American female in NAD  SKIN: skin color, texture, turgor are normal, no rashes or significant lesions EYES: conjunctiva are pink and non-injected, sclera clear LUNGS: clear to auscultation and percussion with normal breathing effort HEART: regular rate & rhythm and no murmurs and no lower extremity edema Musculoskeletal: no cyanosis of digits and  no clubbing  PSYCH: Molina & oriented x 3, fluent speech NEURO: no focal motor/sensory deficits  LABORATORY DATA:  I have reviewed the data as listed    Latest Ref Rng & Units 01/21/2023    2:02 PM 11/25/2022    1:34 PM 10/28/2022   10:38 AM  CBC  WBC 4.0 - 10.5 K/uL 4.1  4.3  5.0   Hemoglobin 12.0 - 15.0 g/dL 9.7  8.4  53.6   Hematocrit 36.0 - 46.0 % 29.5   25.5  31.4   Platelets 150 - 400 K/uL 177  145  157        Latest Ref Rng & Units 01/21/2023    2:02 PM 11/25/2022    1:34 PM 10/28/2022   10:38 AM  CMP  Glucose 70 - 99 mg/dL 98  95  644   BUN 8 - 23 mg/dL 28  41  33   Creatinine 0.44 - 1.00 mg/dL 0.34  7.42  5.95   Sodium 135 - 145 mmol/L 141  142  143   Potassium 3.5 - 5.1 mmol/L 4.4  4.3  4.3   Chloride 98 - 111 mmol/L 108  110  109   CO2 22 - 32 mmol/L 27  27  26    Calcium 8.9 - 10.3 mg/dL 9.1  8.9  9.0   Total Protein 6.5 - 8.1 g/dL 7.2  7.1  7.3   Total Bilirubin 0.3 - 1.2 mg/dL 0.4  0.4  0.4   Alkaline Phos 38 - 126 U/L 57  63  63   AST 15 - 41 U/L 15  16  17    ALT 0 - 44 U/L 9  13  11       ASSESSMENT & PLAN Tina Molina 77 y.o. female with medical history significant for anemia secondary to CKD who presents for transferring care from [facility].  After review of the labs, review of the records, and discussion with the patient the patients findings are most consistent with anemia secondary to chronic kidney disease.  # Anemia in the Setting of Chronic Kidney Disease  --currently managed on Aranesp 300 mcg SQ q 14 days to maintain Hgb >10 --Recommend Aranesp shot if Hgb is <10.  -- labs today show White blood cell 4.1, hemoglobin 9.7, MCV 100.7, and platelets of 177. Iron levels show no deficiency at last check.  --RTC in 3 months with q 4 week labs and injection   No orders of the defined types were placed in this encounter.   All questions were answered. The patient knows to call the clinic with any problems, questions or concerns.  A total of more than 25 minutes were spent on this encounter with face-to-face time and non-face-to-face time, including preparing to see the patient, ordering tests and/or medications, counseling the patient and coordination of care as outlined above.   Ulysees Barns, MD Department of Hematology/Oncology [facility] Phone: [phone] Pager:  [phone] Email: [email or web address]  01/21/2023 3:31 PM

## 2023-01-24 ENCOUNTER — Telehealth: Payer: Self-pay | Admitting: Hematology and Oncology

## 2023-01-26 DIAGNOSIS — M0589 Other rheumatoid arthritis with rheumatoid factor of multiple sites: Secondary | ICD-10-CM | POA: Diagnosis not present

## 2023-02-09 DIAGNOSIS — M0589 Other rheumatoid arthritis with rheumatoid factor of multiple sites: Secondary | ICD-10-CM | POA: Diagnosis not present

## 2023-02-10 DIAGNOSIS — N1832 Chronic kidney disease, stage 3b: Secondary | ICD-10-CM | POA: Diagnosis not present

## 2023-02-10 DIAGNOSIS — I129 Hypertensive chronic kidney disease with stage 1 through stage 4 chronic kidney disease, or unspecified chronic kidney disease: Secondary | ICD-10-CM | POA: Diagnosis not present

## 2023-02-10 DIAGNOSIS — D472 Monoclonal gammopathy: Secondary | ICD-10-CM | POA: Diagnosis not present

## 2023-02-10 DIAGNOSIS — M069 Rheumatoid arthritis, unspecified: Secondary | ICD-10-CM | POA: Diagnosis not present

## 2023-02-10 DIAGNOSIS — E785 Hyperlipidemia, unspecified: Secondary | ICD-10-CM | POA: Diagnosis not present

## 2023-02-11 LAB — LAB REPORT - SCANNED
Albumin, Urine POC: 14.8
Creatinine, POC: 187.9 mg/dL
EGFR: 25
Microalb Creat Ratio: 8

## 2023-02-15 DIAGNOSIS — H40053 Ocular hypertension, bilateral: Secondary | ICD-10-CM | POA: Diagnosis not present

## 2023-02-17 ENCOUNTER — Inpatient Hospital Stay: Payer: Medicare Other | Attending: Hematology & Oncology

## 2023-02-17 ENCOUNTER — Inpatient Hospital Stay: Payer: Medicare Other

## 2023-02-17 DIAGNOSIS — D631 Anemia in chronic kidney disease: Secondary | ICD-10-CM | POA: Insufficient documentation

## 2023-02-17 DIAGNOSIS — N182 Chronic kidney disease, stage 2 (mild): Secondary | ICD-10-CM | POA: Diagnosis not present

## 2023-02-17 LAB — CBC WITH DIFFERENTIAL (CANCER CENTER ONLY)
Abs Immature Granulocytes: 0 10*3/uL (ref 0.00–0.07)
Basophils Absolute: 0 10*3/uL (ref 0.0–0.1)
Basophils Relative: 1 %
Eosinophils Absolute: 0.2 10*3/uL (ref 0.0–0.5)
Eosinophils Relative: 4 %
HCT: 34.7 % — ABNORMAL LOW (ref 36.0–46.0)
Hemoglobin: 11.2 g/dL — ABNORMAL LOW (ref 12.0–15.0)
Immature Granulocytes: 0 %
Lymphocytes Relative: 57 %
Lymphs Abs: 2 10*3/uL (ref 0.7–4.0)
MCH: 33 pg (ref 26.0–34.0)
MCHC: 32.3 g/dL (ref 30.0–36.0)
MCV: 102.4 fL — ABNORMAL HIGH (ref 80.0–100.0)
Monocytes Absolute: 0.5 10*3/uL (ref 0.1–1.0)
Monocytes Relative: 14 %
Neutro Abs: 0.9 10*3/uL — ABNORMAL LOW (ref 1.7–7.7)
Neutrophils Relative %: 24 %
Platelet Count: 158 10*3/uL (ref 150–400)
RBC: 3.39 MIL/uL — ABNORMAL LOW (ref 3.87–5.11)
RDW: 15.6 % — ABNORMAL HIGH (ref 11.5–15.5)
WBC Count: 3.5 10*3/uL — ABNORMAL LOW (ref 4.0–10.5)
nRBC: 0 % (ref 0.0–0.2)

## 2023-02-17 LAB — CMP (CANCER CENTER ONLY)
ALT: 10 U/L (ref 0–44)
AST: 15 U/L (ref 15–41)
Albumin: 4.2 g/dL (ref 3.5–5.0)
Alkaline Phosphatase: 58 U/L (ref 38–126)
Anion gap: 5 (ref 5–15)
BUN: 36 mg/dL — ABNORMAL HIGH (ref 8–23)
CO2: 31 mmol/L (ref 22–32)
Calcium: 9.2 mg/dL (ref 8.9–10.3)
Chloride: 106 mmol/L (ref 98–111)
Creatinine: 2.19 mg/dL — ABNORMAL HIGH (ref 0.44–1.00)
GFR, Estimated: 23 mL/min — ABNORMAL LOW (ref >60)
Glucose, Bld: 108 mg/dL — ABNORMAL HIGH (ref 70–99)
Potassium: 4.3 mmol/L (ref 3.5–5.1)
Sodium: 142 mmol/L (ref 135–145)
Total Bilirubin: 0.4 mg/dL (ref 0.3–1.2)
Total Protein: 7.2 g/dL (ref 6.5–8.1)

## 2023-02-17 NOTE — Progress Notes (Signed)
Pt here for aranesp.  Hgb 11.2.  Parameters met, no need for injection.  Labs gave to pt.

## 2023-02-24 DIAGNOSIS — Z23 Encounter for immunization: Secondary | ICD-10-CM | POA: Diagnosis not present

## 2023-02-28 DIAGNOSIS — M25512 Pain in left shoulder: Secondary | ICD-10-CM | POA: Diagnosis not present

## 2023-02-28 DIAGNOSIS — R5382 Chronic fatigue, unspecified: Secondary | ICD-10-CM | POA: Diagnosis not present

## 2023-02-28 DIAGNOSIS — N184 Chronic kidney disease, stage 4 (severe): Secondary | ICD-10-CM | POA: Diagnosis not present

## 2023-02-28 DIAGNOSIS — D860 Sarcoidosis of lung: Secondary | ICD-10-CM | POA: Diagnosis not present

## 2023-02-28 DIAGNOSIS — R21 Rash and other nonspecific skin eruption: Secondary | ICD-10-CM | POA: Diagnosis not present

## 2023-02-28 DIAGNOSIS — Z6835 Body mass index (BMI) 35.0-35.9, adult: Secondary | ICD-10-CM | POA: Diagnosis not present

## 2023-02-28 DIAGNOSIS — M1991 Primary osteoarthritis, unspecified site: Secondary | ICD-10-CM | POA: Diagnosis not present

## 2023-02-28 DIAGNOSIS — M0589 Other rheumatoid arthritis with rheumatoid factor of multiple sites: Secondary | ICD-10-CM | POA: Diagnosis not present

## 2023-02-28 DIAGNOSIS — E669 Obesity, unspecified: Secondary | ICD-10-CM | POA: Diagnosis not present

## 2023-03-06 ENCOUNTER — Encounter: Payer: Self-pay | Admitting: Internal Medicine

## 2023-03-06 NOTE — Patient Instructions (Addendum)
      Blood work was ordered.   The lab is on the first floor.    Medications changes include :   prednisone 20 mg daily x 5 days      Return in about 6 months (around 09/05/2023) for follow up.

## 2023-03-06 NOTE — Progress Notes (Unsigned)
Subjective:    Patient ID: Tina Molina, female    DOB: 13-Jun-1945, 77 y.o.   MRN: [id number]     HPI Desaray is here for follow up of her chronic medical problems.  Had a burning and itching sensation that started this weekend on her left chin, cheek and above her left eyebrow.  She has some bumps in the same area.  She applied a steroid cream and it is a little better.  Also took some benadryl.  She denies any rash elsewhere and there is no rash on the right side of her face.  Denies any new products.  Denies any pain.  She is not exercising.   Medications and allergies reviewed with patient and updated if appropriate.  Current Outpatient Medications on File Prior to Visit  Medication Sig Dispense Refill   aspirin 81 MG tablet Take 81 mg by mouth daily.     atorvastatin (LIPITOR) 20 MG tablet TAKE ONE TABLET BY MOUTH ONCE DAILY 90 tablet 1   budesonide-formoterol (SYMBICORT) 160-4.5 MCG/ACT inhaler inhale 2 puffs if needed for wheezing 10.2 g 0   carvedilol (COREG) 25 MG tablet TAKE ONE TABLET BY MOUTH TWICE DAILY 180 tablet 1   cyanocobalamin 100 MCG tablet Take 100 mcg by mouth daily.     cyclobenzaprine (FLEXERIL) 10 MG tablet TAKE ONE TABLET BY MOUTH THREE TIMES DAILY 270 tablet 1   famotidine (PEPCID) 40 MG tablet TAKE ONE TABLET BY MOUTH TWICE DAILY 180 tablet 2   furosemide (LASIX) 20 MG tablet TAKE ONE TABLET BY MOUTH ONCE DAILY 90 tablet 2   gabapentin (NEURONTIN) 300 MG capsule TAKE ONE CAPSULE BY MOUTH THREE TIMES DAILY 270 capsule 1   hydrALAZINE (APRESOLINE) 50 MG tablet TAKE ONE TABLET BY MOUTH THREE TIMES DAILY 270 tablet 1   latanoprost (XALATAN) 0.005 % ophthalmic solution 1 drop at bedtime.     leflunomide (ARAVA) 20 MG tablet Take 20 mg by mouth daily.     levothyroxine (SYNTHROID) 175 MCG tablet Take 1 tablet (175 mcg total) by mouth daily. 90 tablet 3   MELATONIN GUMMIES PO Take by mouth at bedtime as needed.     Multiple Vitamin (MULTIVITAMIN WITH  MINERALS) TABS Take 1 tablet by mouth daily.     spironolactone (ALDACTONE) 25 MG tablet TAKE ONE TABLET BY MOUTH ONCE DAILY 90 tablet 2   telmisartan (MICARDIS) 20 MG tablet Take 1 tablet (20 mg total) by mouth daily. 90 tablet 3   triamcinolone ointment (KENALOG) 0.5 % APPLY TO THE AFFECTED AREA TWICE DAILY USE FOR UPTO 14 DAYS 15 g 0   vitamin E 400 UNIT capsule Take 400 Units by mouth daily.      [DISCONTINUED] modafinil (PROVIGIL) 100 MG tablet Take 100 mg by mouth daily.     [DISCONTINUED] potassium chloride (MICRO-K) 10 MEQ CR capsule Take 1 capsule (10 mEq total) by mouth 2 (two) times daily. 180 capsule 1   No current facility-administered medications on file prior to visit.     Review of Systems  Constitutional:  Negative for fever.  Respiratory:  Positive for wheezing. Negative for cough and shortness of breath.   Cardiovascular:  Negative for chest pain, palpitations and leg swelling.  Musculoskeletal:  Positive for back pain (spasms).  Neurological:  Negative for light-headedness and headaches.       Objective:   Vitals:   03/07/23 1317  BP: (!) 142/60  Pulse: 96  Temp: 98.3 F (36.8 C)  SpO2: 94%   BP Readings from Last 3 Encounters:  03/07/23 (!) 142/60  01/21/23 138/73  11/25/22 (!) 172/85   Wt Readings from Last 3 Encounters:  03/07/23 206 lb (93.4 kg)  01/21/23 202 lb 11.2 oz (91.9 kg)  11/12/22 205 lb 3.2 oz (93.1 kg)   Body mass index is 35.36 kg/m.    Physical Exam Constitutional:      General: She is not in acute distress.    Appearance: Normal appearance.  HENT:     Head: Normocephalic and atraumatic.  Eyes:     Conjunctiva/sclera: Conjunctivae normal.  Cardiovascular:     Rate and Rhythm: Normal rate and regular rhythm.     Heart sounds: Normal heart sounds.  Pulmonary:     Effort: Pulmonary effort is normal. No respiratory distress.     Breath sounds: Normal breath sounds. No wheezing.  Musculoskeletal:     Cervical back: Neck  supple.     Right lower leg: No edema.     Left lower leg: No edema.  Lymphadenopathy:     Cervical: No cervical adenopathy.  Skin:    General: Skin is warm and dry.     Findings: Rash (fine papular rash) present.  Neurological:     Mental Status: She is Molina. Mental status is at baseline.  Psychiatric:        Mood and Affect: Mood normal.        Behavior: Behavior normal.        Lab Results  Component Value Date   WBC 3.5 (L) 02/17/2023   HGB 11.2 (L) 02/17/2023   HCT 34.7 (L) 02/17/2023   PLT 158 02/17/2023   GLUCOSE 108 (H) 02/17/2023   CHOL 184 02/23/2022   TRIG 129.0 02/23/2022   HDL 55.60 02/23/2022   LDLDIRECT 156.5 05/27/2011   LDLCALC 103 (H) 02/23/2022   ALT 10 02/17/2023   AST 15 02/17/2023   NA 142 02/17/2023   K 4.3 02/17/2023   CL 106 02/17/2023   CREATININE 2.19 (H) 02/17/2023   BUN 36 (H) 02/17/2023   CO2 31 02/17/2023   TSH 0.76 02/23/2022   HGBA1C 4.9 08/25/2022   HGBA1C 4.9 08/25/2022   HGBA1C 4.9 (A) 08/25/2022   HGBA1C 4.9 08/25/2022   MICROALBUR 0.50 12/15/2012   Estimated Creatinine Clearance: 24.2 mL/min (A) (by C-G formula based on SCr of 2.19 mg/dL (H)).    Assessment & Plan:    See Problem List for Assessment and Plan of chronic medical problems.

## 2023-03-07 ENCOUNTER — Ambulatory Visit: Payer: Medicare Other | Admitting: Internal Medicine

## 2023-03-07 VITALS — BP 122/74 | HR 96 | Temp 98.3°F | Ht 64.0 in | Wt 206.0 lb

## 2023-03-07 DIAGNOSIS — R600 Localized edema: Secondary | ICD-10-CM

## 2023-03-07 DIAGNOSIS — R739 Hyperglycemia, unspecified: Secondary | ICD-10-CM | POA: Diagnosis not present

## 2023-03-07 DIAGNOSIS — N184 Chronic kidney disease, stage 4 (severe): Secondary | ICD-10-CM | POA: Diagnosis not present

## 2023-03-07 DIAGNOSIS — R21 Rash and other nonspecific skin eruption: Secondary | ICD-10-CM | POA: Diagnosis not present

## 2023-03-07 DIAGNOSIS — E890 Postprocedural hypothyroidism: Secondary | ICD-10-CM | POA: Diagnosis not present

## 2023-03-07 DIAGNOSIS — E7849 Other hyperlipidemia: Secondary | ICD-10-CM

## 2023-03-07 DIAGNOSIS — K219 Gastro-esophageal reflux disease without esophagitis: Secondary | ICD-10-CM

## 2023-03-07 DIAGNOSIS — I10 Essential (primary) hypertension: Secondary | ICD-10-CM | POA: Diagnosis not present

## 2023-03-07 LAB — COMPREHENSIVE METABOLIC PANEL
ALT: 14 U/L (ref 0–35)
AST: 17 U/L (ref 0–37)
Albumin: 4.2 g/dL (ref 3.5–5.2)
Alkaline Phosphatase: 63 U/L (ref 39–117)
BUN: 30 mg/dL — ABNORMAL HIGH (ref 6–23)
CO2: 27 meq/L (ref 19–32)
Calcium: 9 mg/dL (ref 8.4–10.5)
Chloride: 108 meq/L (ref 96–112)
Creatinine, Ser: 2.02 mg/dL — ABNORMAL HIGH (ref 0.40–1.20)
GFR: 23.47 mL/min — ABNORMAL LOW (ref >60.00)
Glucose, Bld: 105 mg/dL — ABNORMAL HIGH (ref 70–99)
Potassium: 4.6 meq/L (ref 3.5–5.1)
Sodium: 141 meq/L (ref 135–145)
Total Bilirubin: 0.3 mg/dL (ref 0.2–1.2)
Total Protein: 7.1 g/dL (ref 6.0–8.3)

## 2023-03-07 LAB — LIPID PANEL
Cholesterol: 177 mg/dL (ref 0–200)
HDL: 61.6 mg/dL (ref >39.00)
LDL Cholesterol: 95 mg/dL (ref 0–99)
NonHDL: 115.72
Total CHOL/HDL Ratio: 3
Triglycerides: 102 mg/dL (ref 0.0–149.0)
VLDL: 20.4 mg/dL (ref 0.0–40.0)

## 2023-03-07 LAB — HEMOGLOBIN A1C: Hgb A1c MFr Bld: 5.6 % (ref 4.6–6.5)

## 2023-03-07 LAB — TSH: TSH: 8.55 u[IU]/mL — ABNORMAL HIGH (ref 0.35–5.50)

## 2023-03-07 MED ORDER — SPIRONOLACTONE 25 MG PO TABS: 25.0000 mg | ORAL_TABLET | Freq: Every day | ORAL | 2 refills | Status: DC

## 2023-03-07 MED ORDER — PREDNISONE 20 MG PO TABS: 20.0000 mg | ORAL_TABLET | Freq: Every day | ORAL | 0 refills | Status: DC

## 2023-03-07 NOTE — Assessment & Plan Note (Signed)
Chronic Check lipid panel, CMP Regular exercise and healthy diet encouraged Continue atorvastatin 20 mg daily

## 2023-03-07 NOTE — Assessment & Plan Note (Signed)
Chronic Lab Results  Component Value Date   HGBA1C 4.9 08/25/2022   HGBA1C 4.9 08/25/2022   HGBA1C 4.9 (A) 08/25/2022   HGBA1C 4.9 08/25/2022   Will check A1c

## 2023-03-07 NOTE — Assessment & Plan Note (Signed)
Chronic Blood pressure well controlled cmp Continue carvedilol 25 mg twice daily, hydralazine 50 mg 3 times daily, spironolactone 25 mg daily, telmisartan 40 mg daily

## 2023-03-07 NOTE — Assessment & Plan Note (Signed)
Chronic GERD controlled Continue famotidine 40 mg twice daily

## 2023-03-07 NOTE — Assessment & Plan Note (Signed)
Acute Started couple days ago Only on left side of face and is a papular rash that is itchy and burning It does appear to be on the forehead, cheek and lower face/chin Given distribution shingles vaccine less likely, but it is possible No real pain and given CKD stage IV will hold off on Valtrex Prednisone 20 mg x 5 days Can continue using steroid cream for short duration and Benadryl She will let me know if there is no improvement

## 2023-03-07 NOTE — Assessment & Plan Note (Signed)
Chronic leg edema intermittent Edema tends to get worse during the day-advised more compliance with low-sodium diet and elevating legs Continue furosemide 20 mg daily

## 2023-03-07 NOTE — Assessment & Plan Note (Signed)
Chronic ?Stable ?Following with Dr. Upton ?

## 2023-03-07 NOTE — Assessment & Plan Note (Signed)
Chronic Clinically euthyroid Tsh has been in the normal range Continue levothyroxine 175 mcg daily

## 2023-03-08 ENCOUNTER — Encounter: Payer: Self-pay | Admitting: Nephrology

## 2023-03-08 MED ORDER — LEVOTHYROXINE SODIUM 175 MCG PO TABS: ORAL_TABLET | ORAL | 3 refills | Status: DC

## 2023-03-08 NOTE — Addendum Note (Signed)
Addended by: Pincus Sanes on: 03/08/2023 07:56 AM   Modules accepted: Orders

## 2023-03-09 DIAGNOSIS — M0589 Other rheumatoid arthritis with rheumatoid factor of multiple sites: Secondary | ICD-10-CM | POA: Diagnosis not present

## 2023-03-16 ENCOUNTER — Other Ambulatory Visit: Payer: Self-pay

## 2023-03-16 ENCOUNTER — Telehealth: Payer: Self-pay | Admitting: Internal Medicine

## 2023-03-16 MED ORDER — FAMOTIDINE 40 MG PO TABS: ORAL_TABLET | ORAL | 2 refills | Status: DC

## 2023-03-16 MED ORDER — GABAPENTIN 300 MG PO CAPS: 300.0000 mg | ORAL_CAPSULE | Freq: Three times a day (TID) | ORAL | 1 refills | Status: DC

## 2023-03-16 NOTE — Telephone Encounter (Signed)
Patient said these two prescriptions were not transferred when [facility] closed.   Prescription Request  03/16/2023  LOV: 03/07/2023  What is the name of the medication or equipment?  gabapentin (NEURONTIN) 300 MG capsule famotidine (PEPCID) 40 MG tablet  Have you contacted your pharmacy to request a refill? Yes   Which pharmacy would you like this sent to?  [facility] [phone] - [place],  - [address] AT [facility] [address] [place] [address] Phone: [phone] Fax: [phone]    Patient notified that their request is being sent to the clinical staff for review and that they should receive a response within 2 business days.   Please advise at [facility] [phone]

## 2023-03-16 NOTE — Telephone Encounter (Signed)
Faxed today

## 2023-03-17 ENCOUNTER — Inpatient Hospital Stay: Payer: Medicare Other

## 2023-03-17 ENCOUNTER — Other Ambulatory Visit: Payer: Self-pay

## 2023-03-17 VITALS — BP 140/80 | HR 100 | Temp 98.7°F | Resp 18

## 2023-03-17 DIAGNOSIS — D631 Anemia in chronic kidney disease: Secondary | ICD-10-CM

## 2023-03-17 DIAGNOSIS — N182 Chronic kidney disease, stage 2 (mild): Secondary | ICD-10-CM | POA: Diagnosis not present

## 2023-03-17 DIAGNOSIS — D508 Other iron deficiency anemias: Secondary | ICD-10-CM

## 2023-03-17 LAB — CBC WITH DIFFERENTIAL (CANCER CENTER ONLY)
Abs Immature Granulocytes: 0 10*3/uL (ref 0.00–0.07)
Basophils Absolute: 0 10*3/uL (ref 0.0–0.1)
Basophils Relative: 1 %
Eosinophils Absolute: 0.1 10*3/uL (ref 0.0–0.5)
Eosinophils Relative: 3 %
HCT: 29.9 % — ABNORMAL LOW (ref 36.0–46.0)
Hemoglobin: 10.2 g/dL — ABNORMAL LOW (ref 12.0–15.0)
Immature Granulocytes: 0 %
Lymphocytes Relative: 63 %
Lymphs Abs: 3 10*3/uL (ref 0.7–4.0)
MCH: 33.6 pg (ref 26.0–34.0)
MCHC: 34.1 g/dL (ref 30.0–36.0)
MCV: 98.4 fL (ref 80.0–100.0)
Monocytes Absolute: 0.7 10*3/uL (ref 0.1–1.0)
Monocytes Relative: 15 %
Neutro Abs: 0.9 10*3/uL — ABNORMAL LOW (ref 1.7–7.7)
Neutrophils Relative %: 18 %
Platelet Count: 152 10*3/uL (ref 150–400)
RBC: 3.04 MIL/uL — ABNORMAL LOW (ref 3.87–5.11)
RDW: 14.5 % (ref 11.5–15.5)
WBC Count: 4.7 10*3/uL (ref 4.0–10.5)
nRBC: 0 % (ref 0.0–0.2)

## 2023-03-17 LAB — CMP (CANCER CENTER ONLY)
ALT: 14 U/L (ref 0–44)
AST: 14 U/L — ABNORMAL LOW (ref 15–41)
Albumin: 4.1 g/dL (ref 3.5–5.0)
Alkaline Phosphatase: 57 U/L (ref 38–126)
Anion gap: 4 — ABNORMAL LOW (ref 5–15)
BUN: 30 mg/dL — ABNORMAL HIGH (ref 8–23)
CO2: 31 mmol/L (ref 22–32)
Calcium: 8.9 mg/dL (ref 8.9–10.3)
Chloride: 107 mmol/L (ref 98–111)
Creatinine: 2.01 mg/dL — ABNORMAL HIGH (ref 0.44–1.00)
GFR, Estimated: 25 mL/min — ABNORMAL LOW (ref >60)
Glucose, Bld: 113 mg/dL — ABNORMAL HIGH (ref 70–99)
Potassium: 4.4 mmol/L (ref 3.5–5.1)
Sodium: 142 mmol/L (ref 135–145)
Total Bilirubin: 0.6 mg/dL (ref 0.3–1.2)
Total Protein: 6.9 g/dL (ref 6.5–8.1)

## 2023-03-17 MED ORDER — DARBEPOETIN ALFA 300 MCG/0.6ML IJ SOSY: 300.0000 ug | PREFILLED_SYRINGE | Freq: Once | INTRAMUSCULAR | Status: DC

## 2023-03-31 ENCOUNTER — Telehealth: Payer: Self-pay | Admitting: Internal Medicine

## 2023-03-31 NOTE — Telephone Encounter (Signed)
Prescription Request  03/31/2023  LOV: 03/07/2023  What is the name of the medication or equipment? Telmesartan, carvedilol, atorvastatin -  patient is requesting 90 day refills  Have you contacted your pharmacy to request a refill? Yes   Which pharmacy would you like this sent to?   [facility] [phone] - [place], [place] - [address] AT [facility] [address] [place] [address] Phone: [phone] Fax: [phone]    Patient notified that their request is being sent to the clinical staff for review and that they should receive a response within 2 business days.   Please advise at Mobile There is no such number on file (mobile).

## 2023-04-01 ENCOUNTER — Other Ambulatory Visit: Payer: Self-pay

## 2023-04-01 MED ORDER — CARVEDILOL 25 MG PO TABS: ORAL_TABLET | ORAL | 1 refills | Status: DC

## 2023-04-01 MED ORDER — ATORVASTATIN CALCIUM 20 MG PO TABS: ORAL_TABLET | ORAL | 1 refills | Status: DC

## 2023-04-01 MED ORDER — TELMISARTAN 20 MG PO TABS: 20.0000 mg | ORAL_TABLET | Freq: Every day | ORAL | 3 refills | Status: DC

## 2023-04-01 NOTE — Telephone Encounter (Signed)
Refill sent in today

## 2023-04-13 ENCOUNTER — Inpatient Hospital Stay: Payer: Medicare Other

## 2023-04-13 ENCOUNTER — Telehealth: Payer: Self-pay | Admitting: Hematology and Oncology

## 2023-04-13 ENCOUNTER — Inpatient Hospital Stay: Payer: Medicare Other | Admitting: Hematology and Oncology

## 2023-04-16 NOTE — Progress Notes (Unsigned)
Subjective:    Patient ID: Tina Molina, female    DOB: 09-30-1945, 77 y.o.   MRN: [id number]      HPI Tina Molina is here for No chief complaint on file.    Rash on both hands -     Medications and allergies reviewed with patient and updated if appropriate.  Current Outpatient Medications on File Prior to Visit  Medication Sig Dispense Refill   aspirin 81 MG tablet Take 81 mg by mouth daily.     atorvastatin (LIPITOR) 20 MG tablet TAKE ONE TABLET BY MOUTH ONCE DAILY 90 tablet 1   budesonide-formoterol (SYMBICORT) 160-4.5 MCG/ACT inhaler inhale 2 puffs if needed for wheezing 10.2 g 0   carvedilol (COREG) 25 MG tablet TAKE ONE TABLET BY MOUTH TWICE DAILY 180 tablet 1   cyanocobalamin 100 MCG tablet Take 100 mcg by mouth daily.     cyclobenzaprine (FLEXERIL) 10 MG tablet TAKE ONE TABLET BY MOUTH THREE TIMES DAILY 270 tablet 1   famotidine (PEPCID) 40 MG tablet TAKE ONE TABLET BY MOUTH TWICE DAILY 180 tablet 2   furosemide (LASIX) 20 MG tablet TAKE ONE TABLET BY MOUTH ONCE DAILY 90 tablet 2   gabapentin (NEURONTIN) 300 MG capsule Take 1 capsule (300 mg total) by mouth 3 (three) times daily. 270 capsule 1   hydrALAZINE (APRESOLINE) 50 MG tablet TAKE ONE TABLET BY MOUTH THREE TIMES DAILY 270 tablet 1   latanoprost (XALATAN) 0.005 % ophthalmic solution 1 drop at bedtime.     leflunomide (ARAVA) 20 MG tablet Take 20 mg by mouth daily.     levothyroxine (SYNTHROID) 175 MCG tablet Take 1 tablet p.o. 6 days a week, once a week take 2 tablets p.o.  Take 30 minutes prior to breakfast. 102 tablet 3   MELATONIN GUMMIES PO Take by mouth at bedtime as needed.     Multiple Vitamin (MULTIVITAMIN WITH MINERALS) TABS Take 1 tablet by mouth daily.     predniSONE (DELTASONE) 20 MG tablet Take 1 tablet (20 mg total) by mouth daily with breakfast. 5 tablet 0   spironolactone (ALDACTONE) 25 MG tablet Take 1 tablet (25 mg total) by mouth daily. 90 tablet 2   telmisartan (MICARDIS) 20 MG tablet Take 1  tablet (20 mg total) by mouth daily. 90 tablet 3   triamcinolone ointment (KENALOG) 0.5 % APPLY TO THE AFFECTED AREA TWICE DAILY USE FOR UPTO 14 DAYS 15 g 0   vitamin E 400 UNIT capsule Take 400 Units by mouth daily.      [DISCONTINUED] modafinil (PROVIGIL) 100 MG tablet Take 100 mg by mouth daily.     [DISCONTINUED] potassium chloride (MICRO-K) 10 MEQ CR capsule Take 1 capsule (10 mEq total) by mouth 2 (two) times daily. 180 capsule 1   No current facility-administered medications on file prior to visit.    Review of Systems     Objective:  There were no vitals filed for this visit. BP Readings from Last 3 Encounters:  03/17/23 (!) 140/80  03/07/23 122/74  01/21/23 138/73   Wt Readings from Last 3 Encounters:  03/07/23 206 lb (93.4 kg)  01/21/23 202 lb 11.2 oz (91.9 kg)  11/12/22 205 lb 3.2 oz (93.1 kg)   There is no height or weight on file to calculate BMI.    Physical Exam         Assessment & Plan:    See Problem List for Assessment and Plan of chronic medical problems.

## 2023-04-18 ENCOUNTER — Ambulatory Visit (INDEPENDENT_AMBULATORY_CARE_PROVIDER_SITE_OTHER): Payer: Medicare Other | Admitting: Internal Medicine

## 2023-04-18 ENCOUNTER — Encounter: Payer: Self-pay | Admitting: Internal Medicine

## 2023-04-18 VITALS — BP 138/70 | HR 91 | Temp 98.2°F | Ht 64.0 in | Wt 206.0 lb

## 2023-04-18 DIAGNOSIS — E890 Postprocedural hypothyroidism: Secondary | ICD-10-CM

## 2023-04-18 DIAGNOSIS — R21 Rash and other nonspecific skin eruption: Secondary | ICD-10-CM | POA: Insufficient documentation

## 2023-04-18 LAB — TSH: TSH: 2.18 u[IU]/mL (ref 0.35–5.50)

## 2023-04-18 MED ORDER — PREDNISONE 20 MG PO TABS: 40.0000 mg | ORAL_TABLET | Freq: Every day | ORAL | 0 refills | Status: AC

## 2023-04-18 MED ORDER — BETAMETHASONE VALERATE 0.1 % EX OINT: 1.0000 | TOPICAL_OINTMENT | Freq: Two times a day (BID) | CUTANEOUS | 0 refills | Status: DC

## 2023-04-18 NOTE — Assessment & Plan Note (Signed)
Subacute Started around August Bilateral hands and an area on her leg Possibly related to Cimzia injections which she started in August Start prednisone 40 mg daily x 5 days Use Betamethasone cream after she completes prednisone twice daily Has appointment with her dermatologist 12/13 Advised to call rheumatology-Dr. Dierdre Forth and let him know-she has not taken the last of the injections and plans on stopping but needs to discuss with him

## 2023-04-18 NOTE — Patient Instructions (Signed)
       Medications changes include :   prednisone x 5 days, steroid cream   Sees dermatology and rheumatology

## 2023-04-18 NOTE — Assessment & Plan Note (Signed)
Chronic Due for repeat TSH-will have the blood work done today and we will adjust medication as needed For now continue levothyroxine 175 mcg 6 days a week, to 50 mcg daily once a week

## 2023-04-20 ENCOUNTER — Other Ambulatory Visit: Payer: Medicare Other

## 2023-04-29 DIAGNOSIS — L301 Dyshidrosis [pompholyx]: Secondary | ICD-10-CM | POA: Diagnosis not present

## 2023-05-05 DIAGNOSIS — M48062 Spinal stenosis, lumbar region with neurogenic claudication: Secondary | ICD-10-CM | POA: Diagnosis not present

## 2023-05-09 ENCOUNTER — Other Ambulatory Visit: Payer: Self-pay | Admitting: Internal Medicine

## 2023-05-09 DIAGNOSIS — I10 Essential (primary) hypertension: Secondary | ICD-10-CM

## 2023-05-30 ENCOUNTER — Other Ambulatory Visit: Payer: Self-pay | Admitting: Internal Medicine

## 2023-06-24 ENCOUNTER — Other Ambulatory Visit: Payer: Self-pay | Admitting: Internal Medicine

## 2023-07-27 ENCOUNTER — Other Ambulatory Visit: Payer: Self-pay | Admitting: Internal Medicine

## 2023-07-27 DIAGNOSIS — I10 Essential (primary) hypertension: Secondary | ICD-10-CM

## 2023-07-27 NOTE — Telephone Encounter (Signed)
 Copied from CRM [id number]. Topic: Clinical - Prescription Issue >> Jul 27, 2023  2:13 PM Tina Molina wrote: Reason for CRM:  Patient is requesting all of her medications be transferred to her new preferred pharmacy [facility] - [place], [place] - [address] [place] [address] Phone: [phone] Fax: [phone] - Patient is requesting Dr. Lawerance Bach to now order 90 day supplies for her prescriptions.  *Patient states she needs her atorvastatin (LIPITOR) 20 MG tablet to stay at [facility]*

## 2023-07-30 MED ORDER — FAMOTIDINE 40 MG PO TABS: ORAL_TABLET | ORAL | 2 refills | Status: DC

## 2023-07-30 MED ORDER — HYDRALAZINE HCL 50 MG PO TABS: 50.0000 mg | ORAL_TABLET | Freq: Three times a day (TID) | ORAL | 1 refills | Status: DC

## 2023-07-30 MED ORDER — ATORVASTATIN CALCIUM 20 MG PO TABS: ORAL_TABLET | ORAL | 1 refills | Status: DC

## 2023-07-30 MED ORDER — CYANOCOBALAMIN 100 MCG PO TABS: 100.0000 ug | ORAL_TABLET | Freq: Every day | ORAL | 3 refills | Status: AC

## 2023-07-30 MED ORDER — FUROSEMIDE 20 MG PO TABS: 20.0000 mg | ORAL_TABLET | Freq: Every day | ORAL | 2 refills | Status: DC

## 2023-07-30 MED ORDER — BETAMETHASONE VALERATE 0.1 % EX OINT: 1.0000 | TOPICAL_OINTMENT | Freq: Two times a day (BID) | CUTANEOUS | 0 refills | Status: AC

## 2023-07-30 MED ORDER — CARVEDILOL 25 MG PO TABS: 25.0000 mg | ORAL_TABLET | Freq: Two times a day (BID) | ORAL | 2 refills | Status: DC

## 2023-07-30 MED ORDER — ADULT MULTIVITAMIN W/MINERALS CH: 1.0000 | ORAL_TABLET | Freq: Every day | ORAL | 3 refills | Status: AC

## 2023-07-30 MED ORDER — BUDESONIDE-FORMOTEROL FUMARATE 160-4.5 MCG/ACT IN AERO: INHALATION_SPRAY | RESPIRATORY_TRACT | 0 refills | Status: AC

## 2023-07-30 MED ORDER — LEVOTHYROXINE SODIUM 175 MCG PO TABS: ORAL_TABLET | ORAL | 3 refills | Status: DC

## 2023-07-30 MED ORDER — GABAPENTIN 300 MG PO CAPS: 300.0000 mg | ORAL_CAPSULE | Freq: Three times a day (TID) | ORAL | 1 refills | Status: DC

## 2023-07-30 MED ORDER — TELMISARTAN 20 MG PO TABS: 20.0000 mg | ORAL_TABLET | Freq: Every day | ORAL | 3 refills | Status: DC

## 2023-07-30 MED ORDER — CYCLOBENZAPRINE HCL 10 MG PO TABS: 10.0000 mg | ORAL_TABLET | Freq: Three times a day (TID) | ORAL | 1 refills | Status: DC

## 2023-07-30 MED ORDER — TRIAMCINOLONE ACETONIDE 0.5 % EX OINT: TOPICAL_OINTMENT | Freq: Two times a day (BID) | CUTANEOUS | 0 refills | Status: AC | PRN

## 2023-07-30 MED ORDER — SPIRONOLACTONE 25 MG PO TABS: 25.0000 mg | ORAL_TABLET | Freq: Every day | ORAL | 2 refills | Status: DC

## 2023-08-03 DIAGNOSIS — M0589 Other rheumatoid arthritis with rheumatoid factor of multiple sites: Secondary | ICD-10-CM | POA: Diagnosis not present

## 2023-08-03 DIAGNOSIS — Z79899 Other long term (current) drug therapy: Secondary | ICD-10-CM | POA: Diagnosis not present

## 2023-08-07 ENCOUNTER — Encounter: Payer: Self-pay | Admitting: Hematology and Oncology

## 2023-08-18 ENCOUNTER — Other Ambulatory Visit: Payer: Self-pay | Admitting: Internal Medicine

## 2023-08-18 NOTE — Telephone Encounter (Signed)
 Copied from CRM [id number]. Topic: Clinical - Medication Refill >> Aug 18, 2023  3:42 PM Fredrich Romans wrote: Most Recent Primary Care Visit:  Provider: BURNS, Bobette Mo  Department: [facility]  Visit Type: SAME DAY  Date: 04/18/2023  Medication: leflunomide (ARAVA) 20 MG tablet   Has the patient contacted their pharmacy? Yes (Agent: If no, request that the patient contact the pharmacy for the refill. If patient does not wish to contact the pharmacy document the reason why and proceed with request.) (Agent: If yes, when and what did the pharmacy advise?)  Is this the correct pharmacy for this prescription? Yes If no, delete pharmacy and type the correct one.  This is the patient's preferred pharmacy:  [facility] - [place], [place] - [address] [place] [address] Phone: [phone] Fax: [phone]     Has the prescription been filled recently? No  Is the patient out of the medication? Yes  Has the patient been seen for an appointment in the last year OR does the patient have an upcoming appointment? Yes  Can we respond through MyChart? Yes  Agent: Please be advised that Rx refills may take up to 3 business days. We ask that you follow-up with your pharmacy.

## 2023-08-26 ENCOUNTER — Other Ambulatory Visit: Payer: Self-pay | Admitting: Internal Medicine

## 2023-08-26 NOTE — Telephone Encounter (Unsigned)
 Copied from CRM [id number]. Topic: Clinical - Medication Refill >> Aug 26, 2023  4:03 PM Denese Killings wrote: Most Recent Primary Care Visit:  Provider: BURNS, Bobette Mo  Department: [facility]  Visit Type: SAME DAY  Date: 04/18/2023  Medication: furosemide (LASIX) 20 MG tablet  leflunomide (ARAVA) 20 MG tablet 90 day supply  Has the patient contacted their pharmacy? Yes (Agent: If no, request that the patient contact the pharmacy for the refill. If patient does not wish to contact the pharmacy document the reason why and proceed with request.) (Agent: If yes, when and what did the pharmacy advise?) stated that they didn't have no record of request   Is this the correct pharmacy for this prescription? Yes If no, delete pharmacy and type the correct one.  This is the patient's preferred pharmacy:  [facility] [place] [place] - [address] [place] [address] Phone: [phone] Fax: [phone]  Has the prescription been filled recently? Yes  Is the patient out of the medication? Yes  Has the patient been seen for an appointment in the last year OR does the patient have an upcoming appointment? Yes  Can we respond through MyChart? Yes  Agent: Please be advised that Rx refills may take up to 3 business days. We ask that you follow-up with your pharmacy.

## 2023-08-29 ENCOUNTER — Telehealth: Payer: Self-pay | Admitting: Hematology and Oncology

## 2023-08-29 MED ORDER — FUROSEMIDE 20 MG PO TABS: 20.0000 mg | ORAL_TABLET | Freq: Every day | ORAL | 2 refills | Status: DC

## 2023-08-30 ENCOUNTER — Encounter: Payer: Self-pay | Admitting: Hematology and Oncology

## 2023-09-05 ENCOUNTER — Encounter: Payer: Self-pay | Admitting: Internal Medicine

## 2023-09-05 NOTE — Patient Instructions (Addendum)
      Blood work was ordered.       Medications changes include :   None    A referral was ordered and someone will call you to schedule an appointment.     Return in about 6 months (around 03/07/2024) for follow up.

## 2023-09-05 NOTE — Progress Notes (Unsigned)
 Subjective:    Patient ID: Tina Molina, female    DOB: 1946/02/16, 78 y.o.   MRN: [id number]     HPI Tina Molina is here for follow up of her chronic medical problems.  Has been out of leflunomide  x 1 month - has not been able to get a refill through rheumatology.  She has had increased joint pain and some swelling. Her hands and her knees having gotten much worse.  She has an appointment with Dr France Ina for knee injections.  She has an appt to see Dr Ebbie Goldmann in 4-5 weeks.    Medications and allergies reviewed with patient and updated if appropriate.  Current Outpatient Medications on File Prior to Visit  Medication Sig Dispense Refill   aspirin  81 MG tablet Take 81 mg by mouth daily.     atorvastatin  (LIPITOR) 20 MG tablet TAKE ONE TABLET BY MOUTH ONCE DAILY 90 tablet 1   betamethasone  valerate ointment (VALISONE ) 0.1 % Apply 1 Application topically 2 (two) times daily. 30 g 0   budesonide -formoterol  (SYMBICORT ) 160-4.5 MCG/ACT inhaler inhale 2 puffs if needed for wheezing 10.2 g 0   carvedilol  (COREG ) 25 MG tablet Take 1 tablet (25 mg total) by mouth 2 (two) times daily. 40 tablet 2   Certolizumab Pegol  (CIMZIA  [place]) Inject into the skin.     clobetasol cream (TEMOVATE) 0.05 % Apply 1 Application topically 2 (two) times daily.     cyanocobalamin  100 MCG tablet Take 1 tablet (100 mcg total) by mouth daily. 90 tablet 3   cyclobenzaprine  (FLEXERIL ) 10 MG tablet Take 1 tablet (10 mg total) by mouth 3 (three) times daily. 270 tablet 1   famotidine  (PEPCID ) 40 MG tablet TAKE ONE TABLET BY MOUTH TWICE DAILY 180 tablet 2   furosemide  (LASIX ) 20 MG tablet Take 1 tablet (20 mg total) by mouth daily. 90 tablet 2   gabapentin  (NEURONTIN ) 300 MG capsule Take 1 capsule (300 mg total) by mouth 3 (three) times daily. 270 capsule 1   hydrALAZINE  (APRESOLINE ) 50 MG tablet Take 1 tablet (50 mg total) by mouth 3 (three) times daily. 270 tablet 1   latanoprost (XALATAN) 0.005 % ophthalmic solution 1  drop at bedtime.     leflunomide  (ARAVA ) 20 MG tablet Take 20 mg by mouth daily.     levothyroxine  (SYNTHROID ) 175 MCG tablet Take 1 tablet p.o. 6 days a week, once a week take 2 tablets p.o.  Take 30 minutes prior to breakfast. 102 tablet 3   MELATONIN GUMMIES PO Take by mouth at bedtime as needed.     Multiple Vitamin (MULTIVITAMIN WITH MINERALS) TABS tablet Take 1 tablet by mouth daily. 90 tablet 3   spironolactone  (ALDACTONE ) 25 MG tablet Take 1 tablet (25 mg total) by mouth daily. 90 tablet 2   telmisartan  (MICARDIS ) 20 MG tablet Take 1 tablet (20 mg total) by mouth daily. 90 tablet 3   triamcinolone  ointment (KENALOG ) 0.5 % Apply topically 2 (two) times daily as needed. 15 g 0   vitamin E  400 UNIT capsule Take 400 Units by mouth daily.      [DISCONTINUED] modafinil (PROVIGIL) 100 MG tablet Take 100 mg by mouth daily.     [DISCONTINUED] potassium chloride  (MICRO-K ) 10 MEQ CR capsule Take 1 capsule (10 mEq total) by mouth 2 (two) times daily. 180 capsule 1   No current facility-administered medications on file prior to visit.     Review of Systems  Constitutional:  Negative for fever.  Respiratory:  Positive for shortness of breath (with exertion at times) and wheezing. Negative for cough.   Cardiovascular:  Positive for leg swelling (chronic - more than usual). Negative for chest pain and palpitations.  Gastrointestinal:        Tonna Frederic controlled  Musculoskeletal:  Positive for arthralgias and joint swelling.  Neurological:  Negative for light-headedness and headaches.       Objective:   Vitals:   09/06/23 1311  BP: 132/78  Pulse: (!) 107  Temp: 98.2 F (36.8 C)  SpO2: 92%   BP Readings from Last 3 Encounters:  09/06/23 132/78  04/18/23 138/70  03/17/23 (!) 140/80   Wt Readings from Last 3 Encounters:  09/06/23 207 lb (93.9 kg)  04/18/23 206 lb (93.4 kg)  03/07/23 206 lb (93.4 kg)   Body mass index is 35.53 kg/m.    Physical Exam Constitutional:      General:  She is not in acute distress.    Appearance: Normal appearance.  HENT:     Head: Normocephalic and atraumatic.  Eyes:     Conjunctiva/sclera: Conjunctivae normal.  Cardiovascular:     Rate and Rhythm: Normal rate and regular rhythm.     Heart sounds: Normal heart sounds.  Pulmonary:     Effort: Pulmonary effort is normal. No respiratory distress.     Breath sounds: Normal breath sounds. No wheezing.  Musculoskeletal:     Cervical back: Neck supple.     Right lower leg: Edema (trace) present.     Left lower leg: Edema (trace) present.  Lymphadenopathy:     Cervical: No cervical adenopathy.  Skin:    General: Skin is warm and dry.     Findings: No rash.  Neurological:     Mental Status: She is alert. Mental status is at baseline.  Psychiatric:        Mood and Affect: Mood normal.        Behavior: Behavior normal.        Lab Results  Component Value Date   WBC 4.7 03/17/2023   HGB 10.2 (L) 03/17/2023   HCT 29.9 (L) 03/17/2023   PLT 152 03/17/2023   GLUCOSE 113 (H) 03/17/2023   CHOL 177 03/07/2023   TRIG 102.0 03/07/2023   HDL 61.60 03/07/2023   LDLDIRECT 156.5 05/27/2011   LDLCALC 95 03/07/2023   ALT 14 03/17/2023   AST 14 (L) 03/17/2023   NA 142 03/17/2023   K 4.4 03/17/2023   CL 107 03/17/2023   CREATININE 2.01 (H) 03/17/2023   BUN 30 (H) 03/17/2023   CO2 31 03/17/2023   TSH 2.18 04/18/2023   HGBA1C 5.6 03/07/2023   MICROALBUR 0.50 12/15/2012     Assessment & Plan:    See Problem List for Assessment and Plan of chronic medical problems.

## 2023-09-06 ENCOUNTER — Other Ambulatory Visit: Payer: Self-pay | Admitting: Internal Medicine

## 2023-09-06 ENCOUNTER — Encounter: Payer: Self-pay | Admitting: Internal Medicine

## 2023-09-06 ENCOUNTER — Ambulatory Visit (INDEPENDENT_AMBULATORY_CARE_PROVIDER_SITE_OTHER): Payer: Medicare Other | Admitting: Internal Medicine

## 2023-09-06 VITALS — BP 132/78 | HR 107 | Temp 98.2°F | Ht 64.0 in | Wt 207.0 lb

## 2023-09-06 DIAGNOSIS — M0579 Rheumatoid arthritis with rheumatoid factor of multiple sites without organ or systems involvement: Secondary | ICD-10-CM | POA: Diagnosis not present

## 2023-09-06 DIAGNOSIS — R600 Localized edema: Secondary | ICD-10-CM | POA: Diagnosis not present

## 2023-09-06 DIAGNOSIS — N184 Chronic kidney disease, stage 4 (severe): Secondary | ICD-10-CM | POA: Diagnosis not present

## 2023-09-06 DIAGNOSIS — E7849 Other hyperlipidemia: Secondary | ICD-10-CM | POA: Diagnosis not present

## 2023-09-06 DIAGNOSIS — E890 Postprocedural hypothyroidism: Secondary | ICD-10-CM

## 2023-09-06 DIAGNOSIS — M48062 Spinal stenosis, lumbar region with neurogenic claudication: Secondary | ICD-10-CM

## 2023-09-06 DIAGNOSIS — I10 Essential (primary) hypertension: Secondary | ICD-10-CM

## 2023-09-06 DIAGNOSIS — R739 Hyperglycemia, unspecified: Secondary | ICD-10-CM | POA: Diagnosis not present

## 2023-09-06 DIAGNOSIS — K219 Gastro-esophageal reflux disease without esophagitis: Secondary | ICD-10-CM | POA: Diagnosis not present

## 2023-09-06 DIAGNOSIS — Z6835 Body mass index (BMI) 35.0-35.9, adult: Secondary | ICD-10-CM

## 2023-09-06 DIAGNOSIS — E2839 Other primary ovarian failure: Secondary | ICD-10-CM

## 2023-09-06 LAB — COMPREHENSIVE METABOLIC PANEL WITH GFR
ALT: 9 U/L (ref 0–35)
AST: 15 U/L (ref 0–37)
Albumin: 4.3 g/dL (ref 3.5–5.2)
Alkaline Phosphatase: 53 U/L (ref 39–117)
BUN: 28 mg/dL — ABNORMAL HIGH (ref 6–23)
CO2: 25 meq/L (ref 19–32)
Calcium: 9 mg/dL (ref 8.4–10.5)
Chloride: 110 meq/L (ref 96–112)
Creatinine, Ser: 1.71 mg/dL — ABNORMAL HIGH (ref 0.40–1.20)
GFR: 28.56 mL/min — ABNORMAL LOW (ref >60.00)
Glucose, Bld: 103 mg/dL — ABNORMAL HIGH (ref 70–99)
Potassium: 4.8 meq/L (ref 3.5–5.1)
Sodium: 141 meq/L (ref 135–145)
Total Bilirubin: 0.4 mg/dL (ref 0.2–1.2)
Total Protein: 7.3 g/dL (ref 6.0–8.3)

## 2023-09-06 LAB — CBC WITH DIFFERENTIAL/PLATELET
Basophils Absolute: 0 10*3/uL (ref 0.0–0.1)
Basophils Relative: 0.9 % (ref 0.0–3.0)
Eosinophils Absolute: 0.2 10*3/uL (ref 0.0–0.7)
Eosinophils Relative: 4.2 % (ref 0.0–5.0)
HCT: 28 % — ABNORMAL LOW (ref 36.0–46.0)
Hemoglobin: 9.1 g/dL — ABNORMAL LOW (ref 12.0–15.0)
Lymphocytes Relative: 43.4 % (ref 12.0–46.0)
Lymphs Abs: 1.7 10*3/uL (ref 0.7–4.0)
MCHC: 32.4 g/dL (ref 30.0–36.0)
MCV: 100.3 fl — ABNORMAL HIGH (ref 78.0–100.0)
Monocytes Absolute: 0.6 10*3/uL (ref 0.1–1.0)
Monocytes Relative: 14.4 % — ABNORMAL HIGH (ref 3.0–12.0)
Neutro Abs: 1.4 10*3/uL (ref 1.4–7.7)
Neutrophils Relative %: 37.1 % — ABNORMAL LOW (ref 43.0–77.0)
Platelets: 171 10*3/uL (ref 150.0–400.0)
RBC: 2.79 Mil/uL — ABNORMAL LOW (ref 3.87–5.11)
RDW: 14.8 % (ref 11.5–15.5)
WBC: 3.8 10*3/uL — ABNORMAL LOW (ref 4.0–10.5)

## 2023-09-06 LAB — LIPID PANEL
Cholesterol: 176 mg/dL (ref 0–200)
HDL: 66.5 mg/dL (ref >39.00)
LDL Cholesterol: 92 mg/dL (ref 0–99)
NonHDL: 109.48
Total CHOL/HDL Ratio: 3
Triglycerides: 86 mg/dL (ref 0.0–149.0)
VLDL: 17.2 mg/dL (ref 0.0–40.0)

## 2023-09-06 LAB — TSH: TSH: 2.59 u[IU]/mL (ref 0.35–5.50)

## 2023-09-06 LAB — HEMOGLOBIN A1C: Hgb A1c MFr Bld: 5.1 % (ref 4.6–6.5)

## 2023-09-06 MED ORDER — FUROSEMIDE 20 MG PO TABS: 20.0000 mg | ORAL_TABLET | Freq: Every day | ORAL | 2 refills | Status: AC

## 2023-09-06 MED ORDER — LEFLUNOMIDE 20 MG PO TABS: 20.0000 mg | ORAL_TABLET | Freq: Every day | ORAL | 1 refills | Status: DC

## 2023-09-06 NOTE — Assessment & Plan Note (Signed)
Chronic Check lipid panel, CMP Regular exercise and healthy diet encouraged Continue atorvastatin 20 mg daily

## 2023-09-06 NOTE — Assessment & Plan Note (Signed)
 Chronic Lab Results  Component Value Date   HGBA1C 5.6 03/07/2023   Will check A1c Low-carb/sugar diet Encouraged regular exercise

## 2023-09-06 NOTE — Assessment & Plan Note (Signed)
Chronic GERD controlled Continue famotidine 40 mg twice daily

## 2023-09-06 NOTE — Assessment & Plan Note (Addendum)
 Chronic Following with Dr. Ebbie Goldmann Has been off leflunomide  for 1 month because she has not been able to get a refill Having significant joint pain and swelling since stopping the medication Will prescribe leflunomide -discussed that I do not typically prescribe this medication but will prescribe it until she is able to see rheumatology (they should be prescribing the medication

## 2023-09-06 NOTE — Assessment & Plan Note (Addendum)
 Chronic leg edema intermittent Has gotten a little worse recently - likely related to increase knee pain/swelling which  is likely OA and RA Continue furosemide  20 mg daily CMP

## 2023-09-06 NOTE — Assessment & Plan Note (Addendum)
 Chronic Chronic lower back pain and associated muscle spasms Limits her activity level Has seen Dr Gwendlyn Lemmings in the past -has an appointment in a couple of months Taking flexeril  2-3 times daily Taking gabapentin  300 mg 3 times a day

## 2023-09-06 NOTE — Assessment & Plan Note (Addendum)
 Chronic Blood pressure controlled cmp Continue carvedilol  25 mg twice daily, hydralazine  50 mg 3 times daily, spironolactone  25 mg daily, telmisartan  40 mg daily

## 2023-09-06 NOTE — Assessment & Plan Note (Addendum)
 Chronic Not able to exercise Weight has been stable

## 2023-09-06 NOTE — Assessment & Plan Note (Signed)
 Chronic Stable CBC, CMP Following with Dr Christianne Cowper

## 2023-09-06 NOTE — Assessment & Plan Note (Signed)
 Chronic Euthyroid Check tsh For now continue levothyroxine  175 mcg 6 days a week, 350 mcg daily once a week

## 2023-09-13 DIAGNOSIS — I129 Hypertensive chronic kidney disease with stage 1 through stage 4 chronic kidney disease, or unspecified chronic kidney disease: Secondary | ICD-10-CM | POA: Diagnosis not present

## 2023-09-13 DIAGNOSIS — D860 Sarcoidosis of lung: Secondary | ICD-10-CM | POA: Diagnosis not present

## 2023-09-13 DIAGNOSIS — D472 Monoclonal gammopathy: Secondary | ICD-10-CM | POA: Diagnosis not present

## 2023-09-13 DIAGNOSIS — D631 Anemia in chronic kidney disease: Secondary | ICD-10-CM | POA: Diagnosis not present

## 2023-09-13 DIAGNOSIS — N1832 Chronic kidney disease, stage 3b: Secondary | ICD-10-CM | POA: Diagnosis not present

## 2023-09-15 ENCOUNTER — Inpatient Hospital Stay (HOSPITAL_BASED_OUTPATIENT_CLINIC_OR_DEPARTMENT_OTHER): Admitting: Hematology and Oncology

## 2023-09-15 ENCOUNTER — Inpatient Hospital Stay: Attending: Hematology and Oncology

## 2023-09-15 ENCOUNTER — Inpatient Hospital Stay

## 2023-09-15 VITALS — BP 135/78

## 2023-09-15 VITALS — BP 166/78 | HR 108 | Temp 97.2°F | Resp 17 | Wt 207.7 lb

## 2023-09-15 DIAGNOSIS — D631 Anemia in chronic kidney disease: Secondary | ICD-10-CM | POA: Insufficient documentation

## 2023-09-15 DIAGNOSIS — N181 Chronic kidney disease, stage 1: Secondary | ICD-10-CM | POA: Diagnosis not present

## 2023-09-15 DIAGNOSIS — N184 Chronic kidney disease, stage 4 (severe): Secondary | ICD-10-CM | POA: Insufficient documentation

## 2023-09-15 DIAGNOSIS — D508 Other iron deficiency anemias: Secondary | ICD-10-CM | POA: Diagnosis not present

## 2023-09-15 LAB — CMP (CANCER CENTER ONLY)
ALT: 10 U/L (ref 0–44)
AST: 14 U/L — ABNORMAL LOW (ref 15–41)
Albumin: 4 g/dL (ref 3.5–5.0)
Alkaline Phosphatase: 54 U/L (ref 38–126)
Anion gap: 4 — ABNORMAL LOW (ref 5–15)
BUN: 45 mg/dL — ABNORMAL HIGH (ref 8–23)
CO2: 29 mmol/L (ref 22–32)
Calcium: 8.4 mg/dL — ABNORMAL LOW (ref 8.9–10.3)
Chloride: 109 mmol/L (ref 98–111)
Creatinine: 2.13 mg/dL — ABNORMAL HIGH (ref 0.44–1.00)
GFR, Estimated: 23 mL/min — ABNORMAL LOW (ref >60)
Glucose, Bld: 107 mg/dL — ABNORMAL HIGH (ref 70–99)
Potassium: 4.3 mmol/L (ref 3.5–5.1)
Sodium: 142 mmol/L (ref 135–145)
Total Bilirubin: 0.3 mg/dL (ref 0.0–1.2)
Total Protein: 6.8 g/dL (ref 6.5–8.1)

## 2023-09-15 LAB — CBC WITH DIFFERENTIAL (CANCER CENTER ONLY)
Abs Immature Granulocytes: 0 10*3/uL (ref 0.00–0.07)
Basophils Absolute: 0 10*3/uL (ref 0.0–0.1)
Basophils Relative: 1 %
Eosinophils Absolute: 0.2 10*3/uL (ref 0.0–0.5)
Eosinophils Relative: 4 %
HCT: 25.2 % — ABNORMAL LOW (ref 36.0–46.0)
Hemoglobin: 8.1 g/dL — ABNORMAL LOW (ref 12.0–15.0)
Immature Granulocytes: 0 %
Lymphocytes Relative: 47 %
Lymphs Abs: 1.9 10*3/uL (ref 0.7–4.0)
MCH: 32.1 pg (ref 26.0–34.0)
MCHC: 32.1 g/dL (ref 30.0–36.0)
MCV: 100 fL (ref 80.0–100.0)
Monocytes Absolute: 0.6 10*3/uL (ref 0.1–1.0)
Monocytes Relative: 15 %
Neutro Abs: 1.3 10*3/uL — ABNORMAL LOW (ref 1.7–7.7)
Neutrophils Relative %: 33 %
Platelet Count: 166 10*3/uL (ref 150–400)
RBC: 2.52 MIL/uL — ABNORMAL LOW (ref 3.87–5.11)
RDW: 14.6 % (ref 11.5–15.5)
WBC Count: 4 10*3/uL (ref 4.0–10.5)
nRBC: 0 % (ref 0.0–0.2)

## 2023-09-15 MED ORDER — DARBEPOETIN ALFA 300 MCG/0.6ML IJ SOSY
300.0000 ug | PREFILLED_SYRINGE | Freq: Once | INTRAMUSCULAR | Status: AC
  Administered 2023-09-15: 300 ug via SUBCUTANEOUS

## 2023-09-15 NOTE — Progress Notes (Signed)
 [facility] [phone]   [phone]  PROGRESS NOTE  Patient Care Team: Colene Dauphin, MD as PCP - General (Internal Medicine) Eilleen Grates, MD as PCP - Cardiology (Cardiology) Jonathan Neighbor, [facility] (Inactive) as Pharmacist (Pharmacist) Guinevere Lefevre, OD as Referring Physician (Optometry)  Hematological/Oncological History # Anemia in the Setting of Chronic Kidney Disease  05/19/2022: last visit with Kennard Pea 07/08/2022: establish care with Dr. Rosaline Coma   CHIEF COMPLAINTS/PURPOSE OF CONSULTATION:  "Anemia 2/2 to CKD "  HISTORY OF PRESENTING ILLNESS:  Derrell Flight 78 y.o. female with medical history significant for anemia secondary to CKD presents for a follow up. She was last seen by Dr. Rosaline Coma on 01/21/2023. In the interim, she received her aranesp  injection last on 03/17/2023.    On exam today Ms. Dugue is accompanied by her sister.  She reports she has been so-so in the interim since our last visit.  She continues to have issues with tiredness and fatigue.  She reports her energy level today is about 6 out of 10.  She is not having any bleeding, bruising, or dark stools.  She does endorse shortness of breath on exertion but no lightheadedness or dizziness.  She reports she is not having any headache or chest pain.  She is not having any palpitations of her chest.  She reports her appetite remains strong.  She has had no hospitalizations or ER visits, but recently began taking an ointment for her eczema.  She also has pain in her knees for which she will be seeing orthopedic surgery on 09/29/2023.  She otherwise denies any fevers, chills, sweats, nausea, vomiting or diarrhea.  A full 10 point ROS is otherwise negative.  She is willing and able to proceed with EPO shot today.  We discussed that her hemoglobin level has dropped to 8.1 with a target greater than 10.0.  She voiced understanding of our findings and plan moving forward.   MEDICAL  HISTORY:  Past Medical History:  Diagnosis Date   Anemia    Associated with leukopenia and lymphocytosis. This is been evaluated by Dr. Maria Shiner . Her platelet count has been normal   Arthritis    Chronic back pain    Chronic kidney disease    Erythropoietin  deficiency anemia 02/23/2016   GERD (gastroesophageal reflux disease)    Hypertension    Lymphocytosis    MGUS (monoclonal gammopathy of unknown significance) 08/03/2012   IgA 792 11/04/11   Pneumonia 04/2010   OP   Renal insufficiency    Sarcoidosis    Thyroid  disease     SURGICAL HISTORY: Past Surgical History:  Procedure Laterality Date   CARPAL TUNNEL RELEASE     Bilateral    CATARACT EXTRACTION Right 05/2015   CATARACT EXTRACTION Left    CERVICAL DISCECTOMY  06/2010   Dr.Pool   CHOLECYSTECTOMY     COLONOSCOPY  2010   POSTERIOR LUMBAR FUSION  02/29/2012   Dr Eartha Gold op respiratory & renal compromise   THYROIDECTOMY  2003   For goiter   UPPER GASTROINTESTINAL ENDOSCOPY  12/01/2011   DB    SOCIAL HISTORY: Social History   Socioeconomic History   Marital status: Divorced    Spouse name: Not on file   Number of children: 0   Years of education: 12   Highest education level: Not on file  Occupational History   Occupation: Retired    Associate Professor: RETIRED  Tobacco Use   Smoking status: Former    Current  packs/day: 0.00    Average packs/day: 0.5 packs/day for 20.0 years (10.0 ttl pk-yrs)    Types: Cigarettes    Start date: 05/17/1977    Quit date: 05/17/1997    Years since quitting: 26.3   Smokeless tobacco: Never  Vaping Use   Vaping status: Never Used  Substance and Sexual Activity   Alcohol use: Yes    Alcohol/week: 1.0 standard drink of alcohol    Types: 1 Glasses of wine per week    Comment: occasionally , less than weekly   Drug use: No   Sexual activity: Not Currently  Other Topics Concern   Not on file  Social History Narrative   Patient is divorced and lives with her sister. Patient is retired.    Caffeine- sometimes - one cup   Right handed.   Social Drivers of Corporate investment banker Strain: Low Risk  (11/10/2022)   Overall Financial Resource Strain (CARDIA)    Difficulty of Paying Living Expenses: Not hard at all  Food Insecurity: No Food Insecurity (11/10/2022)   Hunger Vital Sign    Worried About Running Out of Food in the Last Year: Never true    Ran Out of Food in the Last Year: Never true  Transportation Needs: No Transportation Needs (11/10/2022)   PRAPARE - Administrator, Civil Service (Medical): No    Lack of Transportation (Non-Medical): No  Physical Activity: Insufficiently Active (11/10/2022)   Exercise Vital Sign    Days of Exercise per Week: 2 days    Minutes of Exercise per Session: 20 min  Stress: No Stress Concern Present (08/18/2021)   [employer] of Occupational Health - Occupational Stress Questionnaire    Feeling of Stress : Not at all  Social Connections: Moderately Integrated (11/10/2022)   Social Connection and Isolation Panel [NHANES]    Frequency of Communication with Friends and Family: More than three times a week    Frequency of Social Gatherings with Friends and Family: More than three times a week    Attends Religious Services: More than 4 times per year    Active Member of [employer] or Organizations: Yes    Attends Engineer, structural: More than 4 times per year    Marital Status: Divorced  Intimate Partner Violence: Not At Risk (11/10/2022)   Humiliation, Afraid, Rape, and Kick questionnaire    Fear of Current or Ex-Partner: No    Emotionally Abused: No    Physically Abused: No    Sexually Abused: No    FAMILY HISTORY: Family History  Problem Relation Age of Onset   Diabetes Mother    Arthritis Mother    Glaucoma Mother    Heart disease Mother 36       CHF and apparent stenting.   Anemia Father    Arthritis Father    Heart failure Father 43       No CAD   Hypertension Sister    Kidney disease Sister     Anemia Sister    Hypertension Brother    Anemia Brother    Kidney disease Brother    Arthritis Paternal Grandmother    Diabetes Maternal Aunt    Colon cancer Neg Hx    Colon polyps Neg Hx    Esophageal cancer Neg Hx    Rectal cancer Neg Hx    Stomach cancer Neg Hx     ALLERGIES:  is allergic to penicillins, shellfish allergy, shellfish-derived products, codeine, infliximab , sulfonamide derivatives, azithromycin , and  iodine.  MEDICATIONS:  Current Outpatient Medications  Medication Sig Dispense Refill   aspirin  81 MG tablet Take 81 mg by mouth daily.     atorvastatin  (LIPITOR) 20 MG tablet TAKE ONE TABLET BY MOUTH ONCE DAILY 90 tablet 1   betamethasone  valerate ointment (VALISONE ) 0.1 % Apply 1 Application topically 2 (two) times daily. 30 g 0   budesonide -formoterol  (SYMBICORT ) 160-4.5 MCG/ACT inhaler inhale 2 puffs if needed for wheezing 10.2 g 0   carvedilol  (COREG ) 25 MG tablet Take 1 tablet (25 mg total) by mouth 2 (two) times daily. 40 tablet 2   clobetasol cream (TEMOVATE) 0.05 % Apply 1 Application topically 2 (two) times daily.     cyanocobalamin  100 MCG tablet Take 1 tablet (100 mcg total) by mouth daily. 90 tablet 3   cyclobenzaprine  (FLEXERIL ) 10 MG tablet Take 1 tablet (10 mg total) by mouth 3 (three) times daily. 270 tablet 1   famotidine  (PEPCID ) 40 MG tablet TAKE ONE TABLET BY MOUTH TWICE DAILY 180 tablet 2   furosemide  (LASIX ) 20 MG tablet Take 1 tablet (20 mg total) by mouth daily. 90 tablet 2   gabapentin  (NEURONTIN ) 300 MG capsule Take 1 capsule (300 mg total) by mouth 3 (three) times daily. 270 capsule 1   hydrALAZINE  (APRESOLINE ) 50 MG tablet Take 1 tablet (50 mg total) by mouth 3 (three) times daily. 270 tablet 1   latanoprost (XALATAN) 0.005 % ophthalmic solution 1 drop at bedtime.     leflunomide  (ARAVA ) 20 MG tablet TAKE 1 TABLET(20 MG) BY MOUTH DAILY 90 tablet 1   levothyroxine  (SYNTHROID ) 175 MCG tablet Take 1 tablet p.o. 6 days a week, once a week take 2  tablets p.o.  Take 30 minutes prior to breakfast. 102 tablet 3   MELATONIN GUMMIES PO Take by mouth at bedtime as needed.     Multiple Vitamin (MULTIVITAMIN WITH MINERALS) TABS tablet Take 1 tablet by mouth daily. 90 tablet 3   spironolactone  (ALDACTONE ) 25 MG tablet Take 1 tablet (25 mg total) by mouth daily. 90 tablet 2   telmisartan  (MICARDIS ) 20 MG tablet Take 1 tablet (20 mg total) by mouth daily. 90 tablet 3   triamcinolone  ointment (KENALOG ) 0.5 % Apply topically 2 (two) times daily as needed. 15 g 0   vitamin E  400 UNIT capsule Take 400 Units by mouth daily.      No current facility-administered medications for this visit.    REVIEW OF SYSTEMS:   Constitutional: ( - ) fevers, ( - )  chills , ( - ) night sweats Eyes: ( - ) blurriness of vision, ( - ) double vision, ( - ) watery eyes Ears, nose, mouth, throat, and face: ( - ) mucositis, ( - ) sore throat Respiratory: ( - ) cough, ( - ) dyspnea, ( - ) wheezes Cardiovascular: ( - ) palpitation, ( - ) chest discomfort, ( - ) lower extremity swelling Gastrointestinal:  ( - ) nausea, ( - ) heartburn, ( - ) change in bowel habits Skin: ( - ) abnormal skin rashes Lymphatics: ( - ) new lymphadenopathy, ( - ) easy bruising Neurological: ( - ) numbness, ( - ) tingling, ( - ) new weaknesses Behavioral/Psych: ( - ) mood change, ( - ) new changes  All other systems were reviewed with the patient and are negative.  PHYSICAL EXAMINATION:  Vitals:   09/15/23 1434  BP: (!) 166/78  Pulse: (!) 108  Resp: 17  Temp: (!) 97.2 F (36.2 C)  SpO2: 99%   Filed Weights   09/15/23 1434  Weight: 207 lb 11.2 oz (94.2 kg)    GENERAL: well appearing elderly African-American female in NAD  SKIN: skin color, texture, turgor are normal, no rashes or significant lesions EYES: conjunctiva are pink and non-injected, sclera clear LUNGS: clear to auscultation and percussion with normal breathing effort HEART: regular rate & rhythm and no murmurs and no  lower extremity edema Musculoskeletal: no cyanosis of digits and no clubbing  PSYCH: alert & oriented x 3, fluent speech NEURO: no focal motor/sensory deficits  LABORATORY DATA:  I have reviewed the data as listed    Latest Ref Rng & Units 09/15/2023    2:06 PM 09/06/2023    2:06 PM 03/17/2023    1:06 PM  CBC  WBC 4.0 - 10.5 K/uL 4.0  3.8  4.7   Hemoglobin 12.0 - 15.0 g/dL 8.1  9.1  66.4   Hematocrit 36.0 - 46.0 % 25.2  28.0  29.9   Platelets 150 - 400 K/uL 166  171.0  152        Latest Ref Rng & Units 09/06/2023    2:06 PM 03/17/2023    1:06 PM 03/07/2023    2:35 PM  CMP  Glucose 70 - 99 mg/dL 403  474  259   BUN 6 - 23 mg/dL 28  30  30    Creatinine 0.40 - 1.20 mg/dL 5.63  8.75  6.43   Sodium 135 - 145 mEq/L 141  142  141   Potassium 3.5 - 5.1 mEq/L 4.8  4.4  4.6   Chloride 96 - 112 mEq/L 110  107  108   CO2 19 - 32 mEq/L 25  31  27    Calcium  8.4 - 10.5 mg/dL 9.0  8.9  9.0   Total Protein 6.0 - 8.3 g/dL 7.3  6.9  7.1   Total Bilirubin 0.2 - 1.2 mg/dL 0.4  0.6  0.3   Alkaline Phos 39 - 117 U/L 53  57  63   AST 0 - 37 U/L 15  14  17    ALT 0 - 35 U/L 9  14  14       ASSESSMENT & PLAN Derrell Flight 78 y.o. female with medical history significant for anemia secondary to CKD who presents for transferring care from [facility]  After review of the labs, review of the records, and discussion with the patient the patients findings are most consistent with anemia secondary to chronic kidney disease.  # Anemia in the Setting of Chronic Kidney Disease  --currently managed on Aranesp  300 mcg SQ q 14-28 days to maintain Hgb >10 --Recommend Aranesp  shot if Hgb is <10.  -- labs today show White blood cell 4.0, hemoglobin 8.1, MCV 100, platelets 166. Iron levels show no deficiency at last check.  --will give Aranesp  300 mcg injection today and continue monthly.  --RTC in 3 months with q 4 week labs and injection   No orders of the defined types were placed in this encounter.   All  questions were answered. The patient knows to call the clinic with any problems, questions or concerns.  A total of more than 30 minutes were spent on this encounter with face-to-face time and non-face-to-face time, including preparing to see the patient, ordering tests and/or medications, counseling the patient and coordination of care as outlined above.   Rogerio Clay, MD Department of Hematology/Oncology [facility] at [facility] Phone: [phone]  Pager: [phone] Email: [email or web address]  09/15/2023 2:54 PM

## 2023-09-27 ENCOUNTER — Other Ambulatory Visit: Payer: Self-pay | Admitting: Internal Medicine

## 2023-09-27 DIAGNOSIS — H524 Presbyopia: Secondary | ICD-10-CM | POA: Diagnosis not present

## 2023-09-27 DIAGNOSIS — H52223 Regular astigmatism, bilateral: Secondary | ICD-10-CM | POA: Diagnosis not present

## 2023-09-27 DIAGNOSIS — H5203 Hypermetropia, bilateral: Secondary | ICD-10-CM | POA: Diagnosis not present

## 2023-09-27 DIAGNOSIS — Z961 Presence of intraocular lens: Secondary | ICD-10-CM | POA: Diagnosis not present

## 2023-09-27 DIAGNOSIS — H40053 Ocular hypertension, bilateral: Secondary | ICD-10-CM | POA: Diagnosis not present

## 2023-09-27 DIAGNOSIS — I10 Essential (primary) hypertension: Secondary | ICD-10-CM

## 2023-09-28 DIAGNOSIS — M170 Bilateral primary osteoarthritis of knee: Secondary | ICD-10-CM | POA: Diagnosis not present

## 2023-10-12 ENCOUNTER — Other Ambulatory Visit: Payer: Self-pay | Admitting: Internal Medicine

## 2023-10-12 DIAGNOSIS — R5382 Chronic fatigue, unspecified: Secondary | ICD-10-CM | POA: Diagnosis not present

## 2023-10-12 DIAGNOSIS — M0589 Other rheumatoid arthritis with rheumatoid factor of multiple sites: Secondary | ICD-10-CM | POA: Diagnosis not present

## 2023-10-12 DIAGNOSIS — M1991 Primary osteoarthritis, unspecified site: Secondary | ICD-10-CM | POA: Diagnosis not present

## 2023-10-12 DIAGNOSIS — R21 Rash and other nonspecific skin eruption: Secondary | ICD-10-CM | POA: Diagnosis not present

## 2023-10-12 DIAGNOSIS — M25512 Pain in left shoulder: Secondary | ICD-10-CM | POA: Diagnosis not present

## 2023-10-12 DIAGNOSIS — N184 Chronic kidney disease, stage 4 (severe): Secondary | ICD-10-CM | POA: Diagnosis not present

## 2023-10-12 DIAGNOSIS — D860 Sarcoidosis of lung: Secondary | ICD-10-CM | POA: Diagnosis not present

## 2023-10-12 NOTE — Telephone Encounter (Unsigned)
 Copied from CRM [id number]. Topic: Clinical - Medication Refill >> Oct 12, 2023  2:22 PM Juleen Oakland F wrote: Medication: 100 DAY SUPPLY carvedilol  (COREG ) 25 MG tablet   Has the patient contacted their pharmacy? Yes (Agent: If no, request that the patient contact the pharmacy for the refill. If patient does not wish to contact the pharmacy document the reason why and proceed with request.) (Agent: If yes, when and what did the pharmacy advise?)  This is the patient's preferred pharmacy:   [facility] - [place], [place] - [address] [place]  [address] Phone: [phone] Fax: [phone]  Is this the correct pharmacy for this prescription? Yes If no, delete pharmacy and type the correct one.   Has the prescription been filled recently? Yes  Is the patient out of the medication? Yes  Has the patient been seen for an appointment in the last year OR does the patient have an upcoming appointment? Yes  Can we respond through MyChart? No  Agent: Please be advised that Rx refills may take up to 3 business days. We ask that you follow-up with your pharmacy.

## 2023-10-13 ENCOUNTER — Inpatient Hospital Stay

## 2023-10-13 VITALS — BP 118/48 | HR 100 | Resp 17

## 2023-10-13 DIAGNOSIS — D631 Anemia in chronic kidney disease: Secondary | ICD-10-CM

## 2023-10-13 DIAGNOSIS — N184 Chronic kidney disease, stage 4 (severe): Secondary | ICD-10-CM | POA: Diagnosis not present

## 2023-10-13 DIAGNOSIS — D508 Other iron deficiency anemias: Secondary | ICD-10-CM

## 2023-10-13 LAB — CMP (CANCER CENTER ONLY)
ALT: 20 U/L (ref 0–44)
AST: 18 U/L (ref 15–41)
Albumin: 3.9 g/dL (ref 3.5–5.0)
Alkaline Phosphatase: 65 U/L (ref 38–126)
Anion gap: 4 — ABNORMAL LOW (ref 5–15)
BUN: 49 mg/dL — ABNORMAL HIGH (ref 8–23)
CO2: 25 mmol/L (ref 22–32)
Calcium: 8.8 mg/dL — ABNORMAL LOW (ref 8.9–10.3)
Chloride: 113 mmol/L — ABNORMAL HIGH (ref 98–111)
Creatinine: 1.84 mg/dL — ABNORMAL HIGH (ref 0.44–1.00)
GFR, Estimated: 28 mL/min — ABNORMAL LOW (ref >60)
Glucose, Bld: 96 mg/dL (ref 70–99)
Potassium: 4.7 mmol/L (ref 3.5–5.1)
Sodium: 142 mmol/L (ref 135–145)
Total Bilirubin: 0.3 mg/dL (ref 0.0–1.2)
Total Protein: 6.9 g/dL (ref 6.5–8.1)

## 2023-10-13 LAB — CBC WITH DIFFERENTIAL (CANCER CENTER ONLY)
Abs Immature Granulocytes: 0.01 10*3/uL (ref 0.00–0.07)
Basophils Absolute: 0 10*3/uL (ref 0.0–0.1)
Basophils Relative: 1 %
Eosinophils Absolute: 0.1 10*3/uL (ref 0.0–0.5)
Eosinophils Relative: 2 %
HCT: 29.6 % — ABNORMAL LOW (ref 36.0–46.0)
Hemoglobin: 9.6 g/dL — ABNORMAL LOW (ref 12.0–15.0)
Immature Granulocytes: 0 %
Lymphocytes Relative: 39 %
Lymphs Abs: 1.8 10*3/uL (ref 0.7–4.0)
MCH: 32.4 pg (ref 26.0–34.0)
MCHC: 32.4 g/dL (ref 30.0–36.0)
MCV: 100 fL (ref 80.0–100.0)
Monocytes Absolute: 0.5 10*3/uL (ref 0.1–1.0)
Monocytes Relative: 11 %
Neutro Abs: 2.2 10*3/uL (ref 1.7–7.7)
Neutrophils Relative %: 47 %
Platelet Count: 174 10*3/uL (ref 150–400)
RBC: 2.96 MIL/uL — ABNORMAL LOW (ref 3.87–5.11)
RDW: 13.2 % (ref 11.5–15.5)
WBC Count: 4.7 10*3/uL (ref 4.0–10.5)
nRBC: 0 % (ref 0.0–0.2)

## 2023-10-13 MED ORDER — DARBEPOETIN ALFA 300 MCG/0.6ML IJ SOSY
300.0000 ug | PREFILLED_SYRINGE | Freq: Once | INTRAMUSCULAR | Status: AC
  Administered 2023-10-13: 300 ug via SUBCUTANEOUS
  Filled 2023-10-13: qty 0.6

## 2023-10-13 MED ORDER — CARVEDILOL 25 MG PO TABS: 25.0000 mg | ORAL_TABLET | Freq: Two times a day (BID) | ORAL | 3 refills | Status: DC

## 2023-10-13 NOTE — Telephone Encounter (Signed)
 Pt requests a 100-day supply.

## 2023-11-03 DIAGNOSIS — Z981 Arthrodesis status: Secondary | ICD-10-CM | POA: Diagnosis not present

## 2023-11-03 DIAGNOSIS — M48062 Spinal stenosis, lumbar region with neurogenic claudication: Secondary | ICD-10-CM | POA: Diagnosis not present

## 2023-11-09 ENCOUNTER — Other Ambulatory Visit: Payer: Self-pay | Admitting: Internal Medicine

## 2023-11-10 ENCOUNTER — Other Ambulatory Visit: Payer: Self-pay | Admitting: Hematology and Oncology

## 2023-11-10 ENCOUNTER — Inpatient Hospital Stay

## 2023-11-10 ENCOUNTER — Inpatient Hospital Stay: Attending: Hematology and Oncology

## 2023-11-10 DIAGNOSIS — N189 Chronic kidney disease, unspecified: Secondary | ICD-10-CM | POA: Insufficient documentation

## 2023-11-10 DIAGNOSIS — D508 Other iron deficiency anemias: Secondary | ICD-10-CM

## 2023-11-10 DIAGNOSIS — D631 Anemia in chronic kidney disease: Secondary | ICD-10-CM | POA: Diagnosis not present

## 2023-11-10 LAB — CMP (CANCER CENTER ONLY)
ALT: 26 U/L (ref 0–44)
AST: 17 U/L (ref 15–41)
Albumin: 4 g/dL (ref 3.5–5.0)
Alkaline Phosphatase: 61 U/L (ref 38–126)
Anion gap: 5 (ref 5–15)
BUN: 41 mg/dL — ABNORMAL HIGH (ref 8–23)
CO2: 27 mmol/L (ref 22–32)
Calcium: 9 mg/dL (ref 8.9–10.3)
Chloride: 109 mmol/L (ref 98–111)
Creatinine: 1.82 mg/dL — ABNORMAL HIGH (ref 0.44–1.00)
GFR, Estimated: 28 mL/min — ABNORMAL LOW (ref >60)
Glucose, Bld: 97 mg/dL (ref 70–99)
Potassium: 4.4 mmol/L (ref 3.5–5.1)
Sodium: 141 mmol/L (ref 135–145)
Total Bilirubin: 0.3 mg/dL (ref 0.0–1.2)
Total Protein: 7.1 g/dL (ref 6.5–8.1)

## 2023-11-10 LAB — CBC WITH DIFFERENTIAL (CANCER CENTER ONLY)
Abs Immature Granulocytes: 0.01 10*3/uL (ref 0.00–0.07)
Basophils Absolute: 0 10*3/uL (ref 0.0–0.1)
Basophils Relative: 0 %
Eosinophils Absolute: 0.1 10*3/uL (ref 0.0–0.5)
Eosinophils Relative: 2 %
HCT: 31.2 % — ABNORMAL LOW (ref 36.0–46.0)
Hemoglobin: 10.1 g/dL — ABNORMAL LOW (ref 12.0–15.0)
Immature Granulocytes: 0 %
Lymphocytes Relative: 45 %
Lymphs Abs: 2.1 10*3/uL (ref 0.7–4.0)
MCH: 31.4 pg (ref 26.0–34.0)
MCHC: 32.4 g/dL (ref 30.0–36.0)
MCV: 96.9 fL (ref 80.0–100.0)
Monocytes Absolute: 0.6 10*3/uL (ref 0.1–1.0)
Monocytes Relative: 12 %
Neutro Abs: 1.9 10*3/uL (ref 1.7–7.7)
Neutrophils Relative %: 41 %
Platelet Count: 188 10*3/uL (ref 150–400)
RBC: 3.22 MIL/uL — ABNORMAL LOW (ref 3.87–5.11)
RDW: 14.1 % (ref 11.5–15.5)
WBC Count: 4.6 10*3/uL (ref 4.0–10.5)
nRBC: 0 % (ref 0.0–0.2)

## 2023-11-10 LAB — RETIC PANEL
Immature Retic Fract: 9.4 % (ref 2.3–15.9)
RBC.: 3.17 MIL/uL — ABNORMAL LOW (ref 3.87–5.11)
Retic Count, Absolute: 27.3 10*3/uL (ref 19.0–186.0)
Retic Ct Pct: 0.9 % (ref 0.4–3.1)
Reticulocyte Hemoglobin: 35 pg (ref >27.9)

## 2023-11-10 LAB — FERRITIN: Ferritin: 131 ng/mL (ref 11–307)

## 2023-11-10 LAB — IRON AND IRON BINDING CAPACITY (CC-WL,HP ONLY)
Iron: 88 ug/dL (ref 28–170)
Saturation Ratios: 28 % (ref 10.4–31.8)
TIBC: 309 ug/dL (ref 250–450)
UIBC: 221 ug/dL (ref 148–442)

## 2023-11-10 NOTE — Progress Notes (Signed)
 Aranesp  held due to Hgb not meeting parameters for injection. Dr. Federico notified and in agreeance.

## 2023-11-15 DIAGNOSIS — M170 Bilateral primary osteoarthritis of knee: Secondary | ICD-10-CM | POA: Diagnosis not present

## 2023-11-23 DIAGNOSIS — M170 Bilateral primary osteoarthritis of knee: Secondary | ICD-10-CM | POA: Diagnosis not present

## 2023-11-30 DIAGNOSIS — M170 Bilateral primary osteoarthritis of knee: Secondary | ICD-10-CM | POA: Diagnosis not present

## 2023-12-08 ENCOUNTER — Other Ambulatory Visit: Payer: Self-pay | Admitting: Hematology and Oncology

## 2023-12-08 ENCOUNTER — Inpatient Hospital Stay: Admitting: Hematology and Oncology

## 2023-12-08 ENCOUNTER — Inpatient Hospital Stay: Attending: Hematology and Oncology

## 2023-12-08 ENCOUNTER — Inpatient Hospital Stay

## 2023-12-08 VITALS — BP 135/62 | HR 109 | Temp 97.2°F | Resp 16 | Wt 200.0 lb

## 2023-12-08 DIAGNOSIS — D631 Anemia in chronic kidney disease: Secondary | ICD-10-CM

## 2023-12-08 DIAGNOSIS — D508 Other iron deficiency anemias: Secondary | ICD-10-CM

## 2023-12-08 DIAGNOSIS — N184 Chronic kidney disease, stage 4 (severe): Secondary | ICD-10-CM | POA: Diagnosis not present

## 2023-12-08 DIAGNOSIS — N181 Chronic kidney disease, stage 1: Secondary | ICD-10-CM

## 2023-12-08 LAB — RETIC PANEL
Immature Retic Fract: 16.1 % — ABNORMAL HIGH (ref 2.3–15.9)
RBC.: 2.85 MIL/uL — ABNORMAL LOW (ref 3.87–5.11)
Retic Count, Absolute: 42.2 K/uL (ref 19.0–186.0)
Retic Ct Pct: 1.5 % (ref 0.4–3.1)
Reticulocyte Hemoglobin: 35.9 pg (ref >27.9)

## 2023-12-08 LAB — CMP (CANCER CENTER ONLY)
ALT: 19 U/L (ref 0–44)
AST: 16 U/L (ref 15–41)
Albumin: 3.7 g/dL (ref 3.5–5.0)
Alkaline Phosphatase: 57 U/L (ref 38–126)
Anion gap: 6 (ref 5–15)
BUN: 38 mg/dL — ABNORMAL HIGH (ref 8–23)
CO2: 24 mmol/L (ref 22–32)
Calcium: 8.5 mg/dL — ABNORMAL LOW (ref 8.9–10.3)
Chloride: 111 mmol/L (ref 98–111)
Creatinine: 1.98 mg/dL — ABNORMAL HIGH (ref 0.44–1.00)
GFR, Estimated: 26 mL/min — ABNORMAL LOW (ref >60)
Glucose, Bld: 105 mg/dL — ABNORMAL HIGH (ref 70–99)
Potassium: 4.2 mmol/L (ref 3.5–5.1)
Sodium: 141 mmol/L (ref 135–145)
Total Bilirubin: 0.2 mg/dL (ref 0.0–1.2)
Total Protein: 6.7 g/dL (ref 6.5–8.1)

## 2023-12-08 LAB — CBC WITH DIFFERENTIAL (CANCER CENTER ONLY)
Abs Immature Granulocytes: 0.01 K/uL (ref 0.00–0.07)
Basophils Absolute: 0 K/uL (ref 0.0–0.1)
Basophils Relative: 0 %
Eosinophils Absolute: 0.1 K/uL (ref 0.0–0.5)
Eosinophils Relative: 1 %
HCT: 27.9 % — ABNORMAL LOW (ref 36.0–46.0)
Hemoglobin: 9.1 g/dL — ABNORMAL LOW (ref 12.0–15.0)
Immature Granulocytes: 0 %
Lymphocytes Relative: 49 %
Lymphs Abs: 2.5 K/uL (ref 0.7–4.0)
MCH: 31.6 pg (ref 26.0–34.0)
MCHC: 32.6 g/dL (ref 30.0–36.0)
MCV: 96.9 fL (ref 80.0–100.0)
Monocytes Absolute: 0.6 K/uL (ref 0.1–1.0)
Monocytes Relative: 11 %
Neutro Abs: 2 K/uL (ref 1.7–7.7)
Neutrophils Relative %: 39 %
Platelet Count: 185 K/uL (ref 150–400)
RBC: 2.88 MIL/uL — ABNORMAL LOW (ref 3.87–5.11)
RDW: 14.5 % (ref 11.5–15.5)
WBC Count: 5.1 K/uL (ref 4.0–10.5)
nRBC: 0 % (ref 0.0–0.2)

## 2023-12-08 LAB — IRON AND IRON BINDING CAPACITY (CC-WL,HP ONLY)
Iron: 65 ug/dL (ref 28–170)
Saturation Ratios: 23 % (ref 10.4–31.8)
TIBC: 283 ug/dL (ref 250–450)
UIBC: 218 ug/dL (ref 148–442)

## 2023-12-08 LAB — FERRITIN: Ferritin: 197 ng/mL (ref 11–307)

## 2023-12-08 MED ORDER — DARBEPOETIN ALFA 300 MCG/0.6ML IJ SOSY
300.0000 ug | PREFILLED_SYRINGE | Freq: Once | INTRAMUSCULAR | Status: AC
  Administered 2023-12-08: 300 ug via SUBCUTANEOUS
  Filled 2023-12-08: qty 0.6

## 2023-12-08 NOTE — Progress Notes (Signed)
 [facility] [phone]   [phone]  PROGRESS NOTE  Patient Care Team: Tina Glade PARAS, MD as PCP - General (Internal Medicine) Tina Agent, MD as PCP - Cardiology (Cardiology) Tina Molina, [facility] (Inactive) as Pharmacist (Pharmacist) Tina Molina, OD as Referring Physician (Optometry)  Hematological/Oncological History # Anemia in the Setting of Chronic Kidney Disease  05/19/2022: last visit with Tina Molina 07/08/2022: establish care with Tina Molina   CHIEF COMPLAINTS/PURPOSE OF CONSULTATION:  Anemia 2/2 to CKD   HISTORY OF PRESENTING ILLNESS:  Tina Molina 78 y.o. female with medical history significant for anemia secondary to CKD presents for a follow up. She was last seen by Tina Molina on 09/15/2023. In the interim, she received her aranesp  injections every 4 weeks.  On exam today Tina Molina is accompanied by her sister.  She reports she has been well overall in the interim since her last visit with no major changes in her health.  She has been having issues with feeling tired and her energy is about a 5 out of 10.  She is not having any lightheadedness, dizziness, or shortness of breath.  She reports her appetite remains good.  She is not having any runny nose, sore throat, or cough.  She did have a toothache recently and did have a tooth pulled.  She does not have any issues with bleeding.  She reports that she has not started any new medications.  She notes that she is tolerating her Aranesp  shots well with no major side effects.  She is not having any itching, rash, or redness at the site of injection.  Overall she feels well and has no questions concerns or complaints today.  A full 10 point ROS is otherwise negative.  Overall she is willing and able to continue with Aranesp  shots at this time.   MEDICAL HISTORY:  Past Medical History:  Diagnosis Date   Anemia    Associated with leukopenia and lymphocytosis. This is been  evaluated by Tina Molina . Her platelet count has been normal   Arthritis    Chronic back pain    Chronic kidney disease    Erythropoietin  deficiency anemia 02/23/2016   GERD (gastroesophageal reflux disease)    Hypertension    Lymphocytosis    MGUS (monoclonal gammopathy of unknown significance) 08/03/2012   IgA 792 11/04/11   Pneumonia 04/2010   OP   Renal insufficiency    Sarcoidosis    Thyroid  disease     SURGICAL HISTORY: Past Surgical History:  Procedure Laterality Date   CARPAL TUNNEL RELEASE     Bilateral    CATARACT EXTRACTION Right 05/2015   CATARACT EXTRACTION Left    CERVICAL DISCECTOMY  06/2010   [facility]   CHOLECYSTECTOMY     COLONOSCOPY  2010   POSTERIOR LUMBAR FUSION  02/29/2012   Dr Mayo op respiratory & renal compromise   THYROIDECTOMY  2003   For goiter   UPPER GASTROINTESTINAL ENDOSCOPY  12/01/2011   DB    SOCIAL HISTORY: Social History   Socioeconomic History   Marital status: Divorced    Spouse name: Not on file   Number of children: 0   Years of education: 12   Highest education level: Not on file  Occupational History   Occupation: Retired    Associate Professor: RETIRED  Tobacco Use   Smoking status: Former    Current packs/day: 0.00    Average packs/day: 0.5 packs/day for 20.0 years (10.0 ttl pk-yrs)  Types: Cigarettes    Start date: 05/17/1977    Quit date: 05/17/1997    Years since quitting: 26.5   Smokeless tobacco: Never  Vaping Use   Vaping status: Never Used  Substance and Sexual Activity   Alcohol use: Yes    Alcohol/week: 1.0 standard drink of alcohol    Types: 1 Glasses of wine per week    Comment: occasionally , less than weekly   Drug use: No   Sexual activity: Not Currently  Other Topics Concern   Not on file  Social History Narrative   Patient is divorced and lives with her sister. Patient is retired.   Caffeine- sometimes - one cup   Right handed.   Social Drivers of Corporate investment banker Strain: Low Risk   (11/10/2022)   Overall Financial Resource Strain (CARDIA)    Difficulty of Paying Living Expenses: Not hard at all  Food Insecurity: No Food Insecurity (11/10/2022)   Hunger Vital Sign    Worried About Running Out of Food in the Last Year: Never true    Ran Out of Food in the Last Year: Never true  Transportation Needs: No Transportation Needs (11/10/2022)   PRAPARE - Administrator, Civil Service (Medical): No    Lack of Transportation (Non-Medical): No  Physical Activity: Insufficiently Active (11/10/2022)   Exercise Vital Sign    Days of Exercise per Week: 2 days    Minutes of Exercise per Session: 20 min  Stress: No Stress Concern Present (08/18/2021)   [employer] of Occupational Health - Occupational Stress Questionnaire    Feeling of Stress : Not at all  Social Connections: Moderately Integrated (11/10/2022)   Social Connection and Isolation Panel    Frequency of Communication with Friends and Family: More than three times a week    Frequency of Social Gatherings with Friends and Family: More than three times a week    Attends Religious Services: More than 4 times per year    Active Member of [employer] or Organizations: Yes    Attends Engineer, structural: More than 4 times per year    Marital Status: Divorced  Intimate Partner Violence: Not At Risk (11/10/2022)   Humiliation, Afraid, Rape, and Kick questionnaire    Fear of Current or Ex-Partner: No    Emotionally Abused: No    Physically Abused: No    Sexually Abused: No    FAMILY HISTORY: Family History  Problem Relation Age of Onset   Diabetes Mother    Arthritis Mother    Glaucoma Mother    Heart disease Mother 17       CHF and apparent stenting.   Anemia Father    Arthritis Father    Heart failure Father 57       No CAD   Hypertension Sister    Kidney disease Sister    Anemia Sister    Hypertension Brother    Anemia Brother    Kidney disease Brother    Arthritis Paternal Grandmother     Diabetes Maternal Aunt    Colon cancer Neg Hx    Colon polyps Neg Hx    Esophageal cancer Neg Hx    Rectal cancer Neg Hx    Stomach cancer Neg Hx     ALLERGIES:  is allergic to penicillins, shellfish allergy, shellfish-derived products, codeine, infliximab , sulfonamide derivatives, azithromycin , and iodine.  MEDICATIONS:  Current Outpatient Medications  Medication Sig Dispense Refill   aspirin  81 MG tablet Take  81 mg by mouth daily.     atorvastatin  (LIPITOR) 20 MG tablet TAKE 1 TABLET BY MOUTH ONCE  DAILY 100 tablet 2   betamethasone  valerate ointment (VALISONE ) 0.1 % Apply 1 Application topically 2 (two) times daily. 30 g 0   budesonide -formoterol  (SYMBICORT ) 160-4.5 MCG/ACT inhaler inhale 2 puffs if needed for wheezing 10.2 g 0   carvedilol  (COREG ) 25 MG tablet Take 1 tablet (25 mg total) by mouth 2 (two) times daily. 180 tablet 3   clobetasol cream (TEMOVATE) 0.05 % Apply 1 Application topically 2 (two) times daily.     cyanocobalamin  100 MCG tablet Take 1 tablet (100 mcg total) by mouth daily. 90 tablet 3   cyclobenzaprine  (FLEXERIL ) 10 MG tablet Take 1 tablet (10 mg total) by mouth 3 (three) times daily. 270 tablet 1   famotidine  (PEPCID ) 40 MG tablet TAKE ONE TABLET BY MOUTH TWICE DAILY 180 tablet 2   furosemide  (LASIX ) 20 MG tablet Take 1 tablet (20 mg total) by mouth daily. 90 tablet 2   gabapentin  (NEURONTIN ) 300 MG capsule TAKE 1 CAPSULE BY MOUTH 3 TIMES  DAILY 300 capsule 2   hydrALAZINE  (APRESOLINE ) 50 MG tablet TAKE 1 TABLET BY MOUTH 3 TIMES  DAILY 300 tablet 2   latanoprost (XALATAN) 0.005 % ophthalmic solution 1 drop at bedtime.     leflunomide  (ARAVA ) 20 MG tablet TAKE 1 TABLET(20 MG) BY MOUTH DAILY 90 tablet 1   levothyroxine  (SYNTHROID ) 175 MCG tablet Take 1 tablet p.o. 6 days a week, once a week take 2 tablets p.o.  Take 30 minutes prior to breakfast. 102 tablet 3   MELATONIN GUMMIES PO Take by mouth at bedtime as needed.     Multiple Vitamin (MULTIVITAMIN WITH  MINERALS) TABS tablet Take 1 tablet by mouth daily. 90 tablet 3   spironolactone  (ALDACTONE ) 25 MG tablet Take 1 tablet (25 mg total) by mouth daily. 90 tablet 2   telmisartan  (MICARDIS ) 20 MG tablet Take 1 tablet (20 mg total) by mouth daily. 90 tablet 3   triamcinolone  ointment (KENALOG ) 0.5 % Apply topically 2 (two) times daily as needed. 15 g 0   vitamin E  400 UNIT capsule Take 400 Units by mouth daily.      No current facility-administered medications for this visit.    REVIEW OF SYSTEMS:   Constitutional: ( - ) fevers, ( - )  chills , ( - ) night sweats Eyes: ( - ) blurriness of vision, ( - ) double vision, ( - ) watery eyes Ears, nose, mouth, throat, and face: ( - ) mucositis, ( - ) sore throat Respiratory: ( - ) cough, ( - ) dyspnea, ( - ) wheezes Cardiovascular: ( - ) palpitation, ( - ) chest discomfort, ( - ) lower extremity swelling Gastrointestinal:  ( - ) nausea, ( - ) heartburn, ( - ) change in bowel habits Skin: ( - ) abnormal skin rashes Lymphatics: ( - ) new lymphadenopathy, ( - ) easy bruising Neurological: ( - ) numbness, ( - ) tingling, ( - ) new weaknesses Behavioral/Psych: ( - ) mood change, ( - ) new changes  All other systems were reviewed with the patient and are negative.  PHYSICAL EXAMINATION:  There were no vitals filed for this visit.  There were no vitals filed for this visit.   GENERAL: well appearing elderly African-American female in NAD  SKIN: skin color, texture, turgor are normal, no rashes or significant lesions EYES: conjunctiva are pink and non-injected, sclera clear  LUNGS: clear to auscultation and percussion with normal breathing effort HEART: regular rate & rhythm and no murmurs and no lower extremity edema Musculoskeletal: no cyanosis of digits and no clubbing  PSYCH: alert & oriented x 3, fluent speech NEURO: no focal motor/sensory deficits  LABORATORY DATA:  I have reviewed the data as listed    Latest Ref Rng & Units 11/10/2023     1:46 PM 10/13/2023    1:48 PM 09/15/2023    2:06 PM  CBC  WBC 4.0 - 10.5 K/uL 4.6  4.7  4.0   Hemoglobin 12.0 - 15.0 g/dL 89.8  9.6  8.1   Hematocrit 36.0 - 46.0 % 31.2  29.6  25.2   Platelets 150 - 400 K/uL 188  174  166        Latest Ref Rng & Units 11/10/2023    1:46 PM 10/13/2023    1:48 PM 09/15/2023    2:06 PM  CMP  Glucose 70 - 99 mg/dL 97  96  892   BUN 8 - 23 mg/dL 41  49  45   Creatinine 0.44 - 1.00 mg/dL 8.17  8.15  7.86   Sodium 135 - 145 mmol/L 141  142  142   Potassium 3.5 - 5.1 mmol/L 4.4  4.7  4.3   Chloride 98 - 111 mmol/L 109  113  109   CO2 22 - 32 mmol/L 27  25  29    Calcium  8.9 - 10.3 mg/dL 9.0  8.8  8.4   Total Protein 6.5 - 8.1 g/dL 7.1  6.9  6.8   Total Bilirubin 0.0 - 1.2 mg/dL 0.3  0.3  0.3   Alkaline Phos 38 - 126 U/L 61  65  54   AST 15 - 41 U/L 17  18  14    ALT 0 - 44 U/L 26  20  10       ASSESSMENT & PLAN Tina JINNY Molina 78 y.o. female with medical history significant for anemia secondary to CKD who presents for transferring care from [facility]  After review of the labs, review of the records, and discussion with the patient the patients findings are most consistent with anemia secondary to chronic kidney disease.  # Anemia in the Setting of Chronic Kidney Disease  --currently managed on Aranesp  300 mcg SQ q 28 days to maintain Hgb >10 --Recommend Aranesp  shot if Hgb is <10.  -- labs today show White blood cell 5.1, hemoglobin 9.1, MCV 96.9, platelets 185 iron levels show no deficiency at last check.  --will give Aranesp  300 mcg injection today and continue monthly.  --RTC in 3 months with q 4 week labs and injection   No orders of the defined types were placed in this encounter.   All questions were answered. The patient knows to call the clinic with any problems, questions or concerns.  A total of more than 30 minutes were spent on this encounter with face-to-face time and non-face-to-face time, including preparing to see the patient, ordering  tests and/or medications, counseling the patient and coordination of care as outlined above.   Norleen IVAR Kidney, MD Department of Hematology/Oncology [facility] Phone: [phone] Pager: [phone] Email: [email or web address]  12/08/2023 7:34 AM

## 2023-12-09 LAB — ERYTHROPOIETIN: Erythropoietin: 24.2 m[IU]/mL — ABNORMAL HIGH (ref 2.6–18.5)

## 2023-12-20 ENCOUNTER — Encounter: Payer: Self-pay | Admitting: Hematology and Oncology

## 2023-12-29 ENCOUNTER — Encounter: Payer: Self-pay | Admitting: Hematology and Oncology

## 2024-01-02 ENCOUNTER — Other Ambulatory Visit: Payer: Self-pay | Admitting: Internal Medicine

## 2024-01-05 ENCOUNTER — Ambulatory Visit

## 2024-01-05 ENCOUNTER — Other Ambulatory Visit: Payer: Self-pay | Admitting: Hematology and Oncology

## 2024-01-05 ENCOUNTER — Inpatient Hospital Stay: Attending: Hematology and Oncology

## 2024-01-05 VITALS — BP 124/59 | HR 102 | Resp 18

## 2024-01-05 DIAGNOSIS — N184 Chronic kidney disease, stage 4 (severe): Secondary | ICD-10-CM | POA: Diagnosis not present

## 2024-01-05 DIAGNOSIS — D631 Anemia in chronic kidney disease: Secondary | ICD-10-CM | POA: Insufficient documentation

## 2024-01-05 DIAGNOSIS — D508 Other iron deficiency anemias: Secondary | ICD-10-CM

## 2024-01-05 LAB — CBC WITH DIFFERENTIAL (CANCER CENTER ONLY)
Abs Immature Granulocytes: 0.01 K/uL (ref 0.00–0.07)
Basophils Absolute: 0 K/uL (ref 0.0–0.1)
Basophils Relative: 1 %
Eosinophils Absolute: 0.1 K/uL (ref 0.0–0.5)
Eosinophils Relative: 2 %
HCT: 30.2 % — ABNORMAL LOW (ref 36.0–46.0)
Hemoglobin: 9.8 g/dL — ABNORMAL LOW (ref 12.0–15.0)
Immature Granulocytes: 0 %
Lymphocytes Relative: 42 %
Lymphs Abs: 1.9 K/uL (ref 0.7–4.0)
MCH: 32 pg (ref 26.0–34.0)
MCHC: 32.5 g/dL (ref 30.0–36.0)
MCV: 98.7 fL (ref 80.0–100.0)
Monocytes Absolute: 0.6 K/uL (ref 0.1–1.0)
Monocytes Relative: 12 %
Neutro Abs: 2 K/uL (ref 1.7–7.7)
Neutrophils Relative %: 43 %
Platelet Count: 259 K/uL (ref 150–400)
RBC: 3.06 MIL/uL — ABNORMAL LOW (ref 3.87–5.11)
RDW: 15.3 % (ref 11.5–15.5)
WBC Count: 4.6 K/uL (ref 4.0–10.5)
nRBC: 0 % (ref 0.0–0.2)

## 2024-01-05 LAB — CMP (CANCER CENTER ONLY)
ALT: 18 U/L (ref 0–44)
AST: 17 U/L (ref 15–41)
Albumin: 3.9 g/dL (ref 3.5–5.0)
Alkaline Phosphatase: 73 U/L (ref 38–126)
Anion gap: 8 (ref 5–15)
BUN: 44 mg/dL — ABNORMAL HIGH (ref 8–23)
CO2: 25 mmol/L (ref 22–32)
Calcium: 8.9 mg/dL (ref 8.9–10.3)
Chloride: 108 mmol/L (ref 98–111)
Creatinine: 2.21 mg/dL — ABNORMAL HIGH (ref 0.44–1.00)
GFR, Estimated: 22 mL/min — ABNORMAL LOW (ref >60)
Glucose, Bld: 91 mg/dL (ref 70–99)
Potassium: 4.5 mmol/L (ref 3.5–5.1)
Sodium: 141 mmol/L (ref 135–145)
Total Bilirubin: 0.4 mg/dL (ref 0.0–1.2)
Total Protein: 7 g/dL (ref 6.5–8.1)

## 2024-01-05 LAB — IRON AND IRON BINDING CAPACITY (CC-WL,HP ONLY)
Iron: 65 ug/dL (ref 28–170)
Saturation Ratios: 22 % (ref 10.4–31.8)
TIBC: 294 ug/dL (ref 250–450)
UIBC: 229 ug/dL (ref 148–442)

## 2024-01-05 LAB — RETIC PANEL
Immature Retic Fract: 16.2 % — ABNORMAL HIGH (ref 2.3–15.9)
RBC.: 3.04 MIL/uL — ABNORMAL LOW (ref 3.87–5.11)
Retic Count, Absolute: 38.9 K/uL (ref 19.0–186.0)
Retic Ct Pct: 1.3 % (ref 0.4–3.1)
Reticulocyte Hemoglobin: 36 pg (ref >27.9)

## 2024-01-05 LAB — FERRITIN: Ferritin: 226 ng/mL (ref 11–307)

## 2024-01-05 MED ORDER — DARBEPOETIN ALFA 300 MCG/0.6ML IJ SOSY
300.0000 ug | PREFILLED_SYRINGE | Freq: Once | INTRAMUSCULAR | Status: AC
  Administered 2024-01-05: 300 ug via SUBCUTANEOUS
  Filled 2024-01-05: qty 0.6

## 2024-01-06 ENCOUNTER — Ambulatory Visit

## 2024-01-06 VITALS — Ht 64.0 in | Wt 200.0 lb

## 2024-01-06 DIAGNOSIS — Z1231 Encounter for screening mammogram for malignant neoplasm of breast: Secondary | ICD-10-CM

## 2024-01-06 DIAGNOSIS — Z0000 Encounter for general adult medical examination without abnormal findings: Secondary | ICD-10-CM | POA: Diagnosis not present

## 2024-01-06 NOTE — Patient Instructions (Signed)
 [name] , Thank you for taking time out of your busy schedule to complete your Annual Wellness Visit with me. I enjoyed our conversation and look forward to speaking with you again next year. I, as well as your care team,  appreciate your ongoing commitment to your health goals. Please review the following plan we discussed and let me know if I can assist you in the future. Your Game plan/ To Do List    Referrals: If you haven't heard from the office you've been referred to, please reach out to them at the phone provided.  You have an order for:  [x]   3D Mammogram  [x]   Bone Density     Please call for appointment:  [facility] [address] [place], [place] [address] [phone]   Make sure to wear two-piece clothing.  No lotions, powders, or deodorants the day of the appointment. Make sure to bring picture ID and insurance card.  Bring list of medications you are currently taking including any supplements.    Follow up Visits: We will see or speak with you next year for your Next Medicare AWV with our clinical staff Have you seen your provider in the last 6 months (3 months if uncontrolled diabetes)? Yes.  Last visit on 09/05/2023.  Clinician Recommendations:  Aim for 30 minutes of exercise or brisk walking, 6-8 glasses of water, and 5 servings of fruits and vegetables each day. You are for a Tetanus vaccine and can get that done at your pharmacy.      This is a list of the screenings recommended for you:  Health Maintenance  Topic Date Due   DTaP/Tdap/Td vaccine (2 - Tdap) 06/02/2019   DEXA scan (bone density measurement)  07/28/2023   COVID-19 Vaccine (7 - Pfizer risk 2024-25 season) 08/25/2023   Medicare Annual Wellness Visit  11/10/2023   Flu Shot  12/16/2023   Pneumococcal Vaccine for age over 36  Completed   Hepatitis C Screening  Completed   HPV Vaccine  Aged Out   Meningitis B Vaccine  Aged Out   Colon Cancer Screening  Discontinued    Zoster (Shingles) Vaccine  Discontinued    Advanced directives: (Declined) Advance directive discussed with you today. Even though you declined this today, please call our office should you change your mind, and we can give you the proper paperwork for you to fill out. Advance Care Planning is important because it:  [x]  Makes sure you receive the medical care that is consistent with your values, goals, and preferences  [x]  It provides guidance to your family and loved ones and reduces their decisional burden about whether or not they are making the right decisions based on your wishes.  Follow the link provided in your after visit summary or read over the paperwork we have mailed to you to help you started getting your Advance Directives in place. If you need assistance in completing these, please reach out to us  so that we can help you!  See attachments for Preventive Care and Fall Prevention Tips.

## 2024-01-06 NOTE — Progress Notes (Signed)
 Subjective:   Tina Molina is a 78 y.o. who presents for a Medicare Wellness preventive visit.  As a reminder, Annual Wellness Visits don't include a physical exam, and some assessments may be limited, especially if this visit is performed virtually. We may recommend an in-person follow-up visit with your provider if needed.  Visit Complete: Virtual I connected with  Tina Molina on 01/06/24 by a audio enabled telemedicine application and verified that I am speaking with the correct person using two identifiers.  Patient Location: Home  Provider Location: Office/Clinic  I discussed the limitations of evaluation and management by telemedicine. The patient expressed understanding and agreed to proceed.  Vital Signs: Because this visit was a virtual/telehealth visit, some criteria may be missing or patient reported. Any vitals not documented were not able to be obtained and vitals that have been documented are patient reported.  VideoDeclined- This patient declined Librarian, academic. Therefore the visit was completed with audio only.  Persons Participating in Visit: Patient.  AWV Questionnaire: No: Patient Medicare AWV questionnaire was not completed prior to this visit.  Cardiac Risk Factors include: advanced age (>47men, >53 women);hypertension;Other (see comment);dyslipidemia, Risk factor comments: CKD stage IV     Objective:    Today's Vitals   01/06/24 1525 01/06/24 1526  Weight: 200 lb (90.7 kg)   Height: 5' 4 (1.626 m)   PainSc:  4    Body mass index is 34.33 kg/m.     01/06/2024    3:57 PM 11/10/2022   11:44 AM 05/19/2022    1:15 PM 04/07/2022    1:00 PM 04/07/2022   12:58 PM 03/08/2022    1:28 PM 02/08/2022    1:41 PM  Advanced Directives  Does Patient Have a Medical Advance Directive? No No No No No No No  Would patient like information on creating a medical advance directive?  No - Patient declined No - Patient declined No -  Patient declined No - Patient declined No - Patient declined No - Patient declined    Current Medications (verified) Outpatient Encounter Medications as of 01/06/2024  Medication Sig   aspirin  81 MG tablet Take 81 mg by mouth daily.   atorvastatin  (LIPITOR) 20 MG tablet TAKE 1 TABLET BY MOUTH ONCE  DAILY   betamethasone  valerate ointment (VALISONE ) 0.1 % Apply 1 Application topically 2 (two) times daily.   budesonide -formoterol  (SYMBICORT ) 160-4.5 MCG/ACT inhaler inhale 2 puffs if needed for wheezing   carvedilol  (COREG ) 25 MG tablet Take 1 tablet (25 mg total) by mouth 2 (two) times daily.   clobetasol cream (TEMOVATE) 0.05 % Apply 1 Application topically 2 (two) times daily.   cyanocobalamin  100 MCG tablet Take 1 tablet (100 mcg total) by mouth daily.   cyclobenzaprine  (FLEXERIL ) 10 MG tablet Take 1 tablet (10 mg total) by mouth 3 (three) times daily.   famotidine  (PEPCID ) 40 MG tablet TAKE ONE TABLET BY MOUTH TWICE DAILY   furosemide  (LASIX ) 20 MG tablet Take 1 tablet (20 mg total) by mouth daily.   gabapentin  (NEURONTIN ) 300 MG capsule TAKE 1 CAPSULE BY MOUTH 3 TIMES  DAILY   hydrALAZINE  (APRESOLINE ) 50 MG tablet TAKE 1 TABLET BY MOUTH 3 TIMES  DAILY   latanoprost (XALATAN) 0.005 % ophthalmic solution 1 drop at bedtime.   leflunomide  (ARAVA ) 20 MG tablet TAKE 1 TABLET(20 MG) BY MOUTH DAILY   levothyroxine  (SYNTHROID ) 175 MCG tablet Take 1 tablet p.o. 6 days a week, once a week take 2 tablets  p.o.  Take 30 minutes prior to breakfast.   MELATONIN GUMMIES PO Take by mouth at bedtime as needed.   Multiple Vitamin (MULTIVITAMIN WITH MINERALS) TABS tablet Take 1 tablet by mouth daily.   spironolactone  (ALDACTONE ) 25 MG tablet TAKE 1 TABLET BY MOUTH DAILY   telmisartan  (MICARDIS ) 20 MG tablet Take 1 tablet (20 mg total) by mouth daily.   triamcinolone  ointment (KENALOG ) 0.5 % Apply topically 2 (two) times daily as needed.   vitamin E  400 UNIT capsule Take 400 Units by mouth daily.     [DISCONTINUED] modafinil (PROVIGIL) 100 MG tablet Take 100 mg by mouth daily.   [DISCONTINUED] potassium chloride  (MICRO-K ) 10 MEQ CR capsule Take 1 capsule (10 mEq total) by mouth 2 (two) times daily.   No facility-administered encounter medications on file as of 01/06/2024.    Allergies (verified) Penicillins, Shellfish allergy, Shellfish-derived products, Codeine, Infliximab , Sulfonamide derivatives, Azithromycin , and Iodine   History: Past Medical History:  Diagnosis Date   Anemia    Associated with leukopenia and lymphocytosis. This is been evaluated by Dr. Timmy . Her platelet count has been normal   Arthritis    Chronic back pain    Chronic kidney disease    Erythropoietin  deficiency anemia 02/23/2016   GERD (gastroesophageal reflux disease)    Hypertension    Lymphocytosis    MGUS (monoclonal gammopathy of unknown significance) 08/03/2012   IgA 792 11/04/11   Pneumonia 04/2010   OP   Renal insufficiency    Sarcoidosis    Thyroid  disease    Past Surgical History:  Procedure Laterality Date   CARPAL TUNNEL RELEASE     Bilateral    CATARACT EXTRACTION Right 05/2015   CATARACT EXTRACTION Left    CERVICAL DISCECTOMY  06/2010   Dr.Pool   CHOLECYSTECTOMY     COLONOSCOPY  2010   POSTERIOR LUMBAR FUSION  02/29/2012   Dr Mayo op respiratory & renal compromise   THYROIDECTOMY  2003   For goiter   UPPER GASTROINTESTINAL ENDOSCOPY  12/01/2011   DB   Family History  Problem Relation Age of Onset   Diabetes Mother    Arthritis Mother    Glaucoma Mother    Heart disease Mother 97       CHF and apparent stenting.   Anemia Father    Arthritis Father    Heart failure Father 75       No CAD   Hypertension Sister    Kidney disease Sister    Anemia Sister    Hypertension Brother    Anemia Brother    Kidney disease Brother    Arthritis Paternal Grandmother    Diabetes Maternal Aunt    Colon cancer Neg Hx    Colon polyps Neg Hx    Esophageal cancer Neg Hx     Rectal cancer Neg Hx    Stomach cancer Neg Hx    Social History   Socioeconomic History   Marital status: Divorced    Spouse name: Not on file   Number of children: 0   Years of education: 12   Highest education level: Not on file  Occupational History   Occupation: Retired    Associate Professor: RETIRED  Tobacco Use   Smoking status: Former    Current packs/day: 0.00    Average packs/day: 0.5 packs/day for 20.0 years (10.0 ttl pk-yrs)    Types: Cigarettes    Start date: 05/17/1977    Quit date: 05/17/1997    Years since quitting: 26.6  Smokeless tobacco: Never  Vaping Use   Vaping status: Never Used  Substance and Sexual Activity   Alcohol use: Yes    Alcohol/week: 1.0 standard drink of alcohol    Types: 1 Glasses of wine per week    Comment: occasionally , less than weekly   Drug use: No   Sexual activity: Not Currently  Other Topics Concern   Not on file  Social History Narrative   Patient is divorced and lives with her sister. Patient is retired.   Caffeine- sometimes - one cup   Right handed.   Social Drivers of Corporate investment banker Strain: Low Risk  (01/06/2024)   Overall Financial Resource Strain (CARDIA)    Difficulty of Paying Living Expenses: Not very hard  Food Insecurity: No Food Insecurity (01/06/2024)   Hunger Vital Sign    Worried About Running Out of Food in the Last Year: Never true    Ran Out of Food in the Last Year: Never true  Transportation Needs: No Transportation Needs (01/06/2024)   PRAPARE - Administrator, Civil Service (Medical): No    Lack of Transportation (Non-Medical): No  Physical Activity: Inactive (01/06/2024)   Exercise Vital Sign    Days of Exercise per Week: 0 days    Minutes of Exercise per Session: 0 min  Stress: No Stress Concern Present (01/06/2024)   [employer] of Occupational Health - Occupational Stress Questionnaire    Feeling of Stress: Not at all  Social Connections: Socially Isolated (01/06/2024)    Social Connection and Isolation Panel    Frequency of Communication with Friends and Family: More than three times a week    Frequency of Social Gatherings with Friends and Family: Once a week    Attends Religious Services: Never    Database administrator or Organizations: No    Attends Engineer, structural: Never    Marital Status: Divorced    Tobacco Counseling Counseling given: Not Answered    Clinical Intake:  Pre-visit preparation completed: Yes  Pain : 0-10 Pain Score: 4  Pain Type: Chronic pain Pain Location: Knee (both) Pain Descriptors / Indicators: Aching, Discomfort Pain Onset: 1 to 4 weeks ago Pain Frequency: Intermittent Pain Relieving Factors: Tylenol   Pain Relieving Factors: Tylenol   BMI - recorded: 34.33 Nutritional Status: BMI > 30  Obese Nutritional Risks: None  Lab Results  Component Value Date   HGBA1C 5.1 09/06/2023   HGBA1C 5.6 03/07/2023   HGBA1C 4.9 08/25/2022   HGBA1C 4.9 08/25/2022   HGBA1C 4.9 (A) 08/25/2022   HGBA1C 4.9 08/25/2022     How often do you need to have someone help you when you read instructions, pamphlets, or other written materials from your doctor or pharmacy?: 1 - Never     Information entered by :: Gailya Tauer, RMA   Activities of Daily Living     01/06/2024    3:26 PM  In your present state of health, do you have any difficulty performing the following activities:  Hearing? 0  Vision? 0  Difficulty concentrating or making decisions? 0  Walking or climbing stairs? 0  Dressing or bathing? 0  Doing errands, shopping? 0  Comment brother drives her around  Preparing Food and eating ? N  Using the Toilet? N  In the past six months, have you accidently leaked urine? N  Do you have problems with loss of bowel control? N  Managing your Medications? N  Managing your Finances? N  Housekeeping or managing your Housekeeping? N    Patient Care Team: Geofm Glade PARAS, MD as PCP - General (Internal  Medicine) Lavona Agent, MD as PCP - Cardiology (Cardiology) Fate Morna SAILOR, [facility] (Inactive) as Pharmacist (Pharmacist) Lita Lye, OD as Referring Physician (Optometry)  I have updated your Care Teams any recent Medical Services you may have received from other providers in the past year.     Assessment:   This is a routine wellness examination for Armando.  Hearing/Vision screen Hearing Screening - Comments:: Denies hearing difficulties   Vision Screening - Comments:: Wears eyeglasses/ Dr. Lita   Goals Addressed             This Visit's Progress    patient   On track    Try to eat less fried foods;         Depression Screen     01/06/2024    4:01 PM 09/06/2023    2:05 PM 04/18/2023    2:24 PM 11/10/2022   11:42 AM 02/23/2022    2:34 PM 08/18/2021    1:56 PM 08/18/2021    1:21 PM  PHQ 2/9 Scores  PHQ - 2 Score 0 0 0 0 0 0 0  PHQ- 9 Score 3 6 0 0       Fall Risk     01/06/2024    3:58 PM 09/06/2023    2:04 PM 04/18/2023    2:24 PM 11/10/2022   11:44 AM 02/23/2022    2:34 PM  Fall Risk   Falls in the past year? 0 1 0 0 0  Number falls in past yr: 0 1 0 0 0  Injury with Fall? 0 0 0 0 0  Risk for fall due to :  No Fall Risks No Fall Risks No Fall Risks No Fall Risks  Follow up Falls evaluation completed;Falls prevention discussed Falls evaluation completed Falls evaluation completed Falls evaluation completed Falls evaluation completed      Data saved with a previous flowsheet row definition    MEDICARE RISK AT HOME:  Medicare Risk at Home Any stairs in or around the home?: No If so, are there any without handrails?: No Home free of loose throw rugs in walkways, pet beds, electrical cords, etc?: Yes Adequate lighting in your home to reduce risk of falls?: Yes Life alert?: No Use of a cane, walker or w/c?: No Grab bars in the bathroom?: Yes Shower chair or bench in shower?: Yes Elevated toilet seat or a handicapped toilet?: Yes  TIMED UP AND  GO:  Was the test performed?  No  Cognitive Function: Declined/Normal: No cognitive concerns noted by patient or family. Patient alert, oriented, able to answer questions appropriately and recall recent events. No signs of memory loss or confusion.    08/18/2021    1:21 PM 04/20/2017    2:36 PM 12/31/2015    3:14 PM  MMSE - Mini Mental State Exam  Not completed:   --  Orientation to time 5 5    Orientation to Place 5 5    Registration 3 3    Attention/ Calculation 5 5    Recall 3 1    Language- name 2 objects 2 2    Language- repeat 1 1   Language- follow 3 step command 3 3    Language- read & follow direction 1 1    Write a sentence 1 1    Copy design 1 1    Total score 30 28  Data saved with a previous flowsheet row definition        11/10/2022   11:45 AM  6CIT Screen  What time? 0 points  Count back from 20 0 points  Months in reverse 0 points  Repeat phrase 6 points    Immunizations Immunization History  Administered Date(s) Administered   Fluad Quad(high Dose 65+) 02/12/2019, 02/15/2020, 02/16/2021, 02/12/2022   Influenza Whole 01/20/2010   Influenza, High Dose Seasonal PF 03/01/2013, 03/13/2015, 04/20/2017, 03/30/2018, 02/24/2023   Influenza,inj,Quad PF,6+ Mos 04/05/2016   PFIZER(Purple Top)SARS-COV-2 Vaccination 07/26/2019, 08/16/2019, 09/19/2020   Pfizer Covid-19 Vaccine Bivalent Booster 35yrs & up 02/17/2021, 02/12/2022   Pfizer(Comirnaty)Fall Seasonal Vaccine 12 years and older 02/24/2023   Pneumococcal Conjugate-13 01/13/2015   Pneumococcal Polysaccharide-23 05/30/2013   Td 06/01/2009    Screening Tests Health Maintenance  Topic Date Due   DTaP/Tdap/Td (2 - Tdap) 06/02/2019   DEXA SCAN  07/28/2023   COVID-19 Vaccine (7 - Pfizer risk 2024-25 season) 08/25/2023   Medicare Annual Wellness (AWV)  11/10/2023   INFLUENZA VACCINE  12/16/2023   Pneumococcal Vaccine: 50+ Years  Completed   Hepatitis C Screening  Completed   HPV VACCINES  Aged Out    Meningococcal B Vaccine  Aged Out   Colonoscopy  Discontinued   Zoster Vaccines- Shingrix  Discontinued    Health Maintenance  Health Maintenance Due  Topic Date Due   DTaP/Tdap/Td (2 - Tdap) 06/02/2019   DEXA SCAN  07/28/2023   COVID-19 Vaccine (7 - Pfizer risk 2024-25 season) 08/25/2023   Medicare Annual Wellness (AWV)  11/10/2023   INFLUENZA VACCINE  12/16/2023   Health Maintenance Items Addressed: Mammogram ordered, DEXA ordered, See Nurse Notes at the end of this note  Additional Screening:  Vision Screening: Recommended annual ophthalmology exams for early detection of glaucoma and other disorders of the eye. Would you like a referral to an eye doctor? No    Dental Screening: Recommended annual dental exams for proper oral hygiene  Community Resource Referral / Chronic Care Management: CRR required this visit?  No   CCM required this visit?  No   Plan:    I have personally reviewed and noted the following in the patient's chart:   Medical and social history Use of alcohol, tobacco or illicit drugs  Current medications and supplements including opioid prescriptions. Patient is not currently taking opioid prescriptions. Functional ability and status Nutritional status Physical activity Advanced directives List of other physicians Hospitalizations, surgeries, and ER visits in previous 12 months Vitals Screenings to include cognitive, depression, and falls Referrals and appointments  In addition, I have reviewed and discussed with patient certain preventive protocols, quality metrics, and best practice recommendations. A written personalized care plan for preventive services as well as general preventive health recommendations were provided to patient.   Tannie Koskela L Tahsin Benyo, CMA   01/06/2024   After Visit Summary: (MyChart) Due to this being a telephonic visit, the after visit summary with patients personalized plan was offered to patient via MyChart   Notes:  Patient is due for a Tdap.  Patient is due for a DEXA and order was placed in April 2025.  She is also due for a mammogram and order has been placed today.   Patient had no other concerns to address today.

## 2024-01-09 ENCOUNTER — Other Ambulatory Visit: Payer: Self-pay | Admitting: Internal Medicine

## 2024-01-13 ENCOUNTER — Other Ambulatory Visit: Payer: Self-pay | Admitting: Internal Medicine

## 2024-01-13 DIAGNOSIS — R921 Mammographic calcification found on diagnostic imaging of breast: Secondary | ICD-10-CM

## 2024-01-19 DIAGNOSIS — M0589 Other rheumatoid arthritis with rheumatoid factor of multiple sites: Secondary | ICD-10-CM | POA: Diagnosis not present

## 2024-01-19 DIAGNOSIS — D860 Sarcoidosis of lung: Secondary | ICD-10-CM | POA: Diagnosis not present

## 2024-01-19 DIAGNOSIS — M1991 Primary osteoarthritis, unspecified site: Secondary | ICD-10-CM | POA: Diagnosis not present

## 2024-01-19 DIAGNOSIS — N184 Chronic kidney disease, stage 4 (severe): Secondary | ICD-10-CM | POA: Diagnosis not present

## 2024-01-19 DIAGNOSIS — R21 Rash and other nonspecific skin eruption: Secondary | ICD-10-CM | POA: Diagnosis not present

## 2024-01-19 DIAGNOSIS — M25512 Pain in left shoulder: Secondary | ICD-10-CM | POA: Diagnosis not present

## 2024-01-19 DIAGNOSIS — M25511 Pain in right shoulder: Secondary | ICD-10-CM | POA: Diagnosis not present

## 2024-01-19 DIAGNOSIS — R5382 Chronic fatigue, unspecified: Secondary | ICD-10-CM | POA: Diagnosis not present

## 2024-01-30 ENCOUNTER — Telehealth: Payer: Self-pay | Admitting: Radiology

## 2024-01-30 NOTE — Telephone Encounter (Signed)
 Copied from CRM [id number]. Topic: Clinical - Request for Lab/Test Order >> Jan 30, 2024  1:47 PM Burnard DEL wrote: Reason for CRM: Bone density  Location: [facility]  Breast center told her they no longer do Bone density and that the order would have to be sent to [facility]

## 2024-02-01 ENCOUNTER — Other Ambulatory Visit: Payer: Self-pay | Admitting: Physician Assistant

## 2024-02-01 DIAGNOSIS — D508 Other iron deficiency anemias: Secondary | ICD-10-CM

## 2024-02-02 ENCOUNTER — Other Ambulatory Visit: Payer: Self-pay

## 2024-02-02 ENCOUNTER — Inpatient Hospital Stay

## 2024-02-02 ENCOUNTER — Other Ambulatory Visit: Payer: Self-pay | Admitting: Hematology and Oncology

## 2024-02-02 ENCOUNTER — Inpatient Hospital Stay: Attending: Hematology and Oncology

## 2024-02-02 DIAGNOSIS — N189 Chronic kidney disease, unspecified: Secondary | ICD-10-CM | POA: Diagnosis not present

## 2024-02-02 DIAGNOSIS — Z780 Asymptomatic menopausal state: Secondary | ICD-10-CM

## 2024-02-02 DIAGNOSIS — Z13820 Encounter for screening for osteoporosis: Secondary | ICD-10-CM

## 2024-02-02 DIAGNOSIS — D631 Anemia in chronic kidney disease: Secondary | ICD-10-CM | POA: Diagnosis not present

## 2024-02-02 DIAGNOSIS — D508 Other iron deficiency anemias: Secondary | ICD-10-CM

## 2024-02-02 LAB — CMP (CANCER CENTER ONLY)
ALT: 18 U/L (ref 0–44)
AST: 18 U/L (ref 15–41)
Albumin: 4 g/dL (ref 3.5–5.0)
Alkaline Phosphatase: 60 U/L (ref 38–126)
Anion gap: 5 (ref 5–15)
BUN: 48 mg/dL — ABNORMAL HIGH (ref 8–23)
CO2: 23 mmol/L (ref 22–32)
Calcium: 8.6 mg/dL — ABNORMAL LOW (ref 8.9–10.3)
Chloride: 111 mmol/L (ref 98–111)
Creatinine: 1.86 mg/dL — ABNORMAL HIGH (ref 0.44–1.00)
GFR, Estimated: 28 mL/min — ABNORMAL LOW (ref >60)
Glucose, Bld: 92 mg/dL (ref 70–99)
Potassium: 4.7 mmol/L (ref 3.5–5.1)
Sodium: 139 mmol/L (ref 135–145)
Total Bilirubin: 0.4 mg/dL (ref 0.0–1.2)
Total Protein: 7 g/dL (ref 6.5–8.1)

## 2024-02-02 LAB — CBC WITH DIFFERENTIAL (CANCER CENTER ONLY)
Abs Immature Granulocytes: 0.01 K/uL (ref 0.00–0.07)
Basophils Absolute: 0 K/uL (ref 0.0–0.1)
Basophils Relative: 1 %
Eosinophils Absolute: 0.1 K/uL (ref 0.0–0.5)
Eosinophils Relative: 1 %
HCT: 34.3 % — ABNORMAL LOW (ref 36.0–46.0)
Hemoglobin: 11 g/dL — ABNORMAL LOW (ref 12.0–15.0)
Immature Granulocytes: 0 %
Lymphocytes Relative: 42 %
Lymphs Abs: 1.9 K/uL (ref 0.7–4.0)
MCH: 32.1 pg (ref 26.0–34.0)
MCHC: 32.1 g/dL (ref 30.0–36.0)
MCV: 100 fL (ref 80.0–100.0)
Monocytes Absolute: 0.5 K/uL (ref 0.1–1.0)
Monocytes Relative: 11 %
Neutro Abs: 2 K/uL (ref 1.7–7.7)
Neutrophils Relative %: 45 %
Platelet Count: 206 K/uL (ref 150–400)
RBC: 3.43 MIL/uL — ABNORMAL LOW (ref 3.87–5.11)
RDW: 14.6 % (ref 11.5–15.5)
WBC Count: 4.6 K/uL (ref 4.0–10.5)
nRBC: 0 % (ref 0.0–0.2)

## 2024-02-02 LAB — IRON AND IRON BINDING CAPACITY (CC-WL,HP ONLY)
Iron: 137 ug/dL (ref 28–170)
Saturation Ratios: 41 % — ABNORMAL HIGH (ref 10.4–31.8)
TIBC: 335 ug/dL (ref 250–450)
UIBC: 198 ug/dL (ref 148–442)

## 2024-02-02 LAB — RETIC PANEL
Immature Retic Fract: 8.4 % (ref 2.3–15.9)
RBC.: 3.39 MIL/uL — ABNORMAL LOW (ref 3.87–5.11)
Retic Count, Absolute: 29.5 K/uL (ref 19.0–186.0)
Retic Ct Pct: 0.9 % (ref 0.4–3.1)
Reticulocyte Hemoglobin: 35.5 pg (ref >27.9)

## 2024-02-02 LAB — FERRITIN: Ferritin: 136 ng/mL (ref 11–307)

## 2024-02-02 NOTE — Telephone Encounter (Signed)
 Bone density ordered today for [facility] location

## 2024-02-02 NOTE — Progress Notes (Signed)
 Aranesp  held Hgb 11.0 Labs printed handed to pt verbalized understanding. Left facility via walked.

## 2024-02-06 ENCOUNTER — Telehealth: Payer: Self-pay

## 2024-02-06 NOTE — Telephone Encounter (Signed)
 Copied from CRM #[id number]. Topic: Appointments - Scheduling Inquiry for Clinic >> Feb 06, 2024  2:18 PM [place] wrote: Reason for CRM: Patient wanting to schedule for a bone density test, please contact patient

## 2024-02-09 ENCOUNTER — Ambulatory Visit
Admission: RE | Admit: 2024-02-09 | Discharge: 2024-02-09 | Disposition: A | Discharge status: Home / Self Care | Source: Ambulatory Visit | Attending: Internal Medicine

## 2024-02-09 ENCOUNTER — Encounter: Payer: Self-pay | Admitting: Hematology and Oncology

## 2024-02-09 ENCOUNTER — Encounter

## 2024-02-09 DIAGNOSIS — R921 Mammographic calcification found on diagnostic imaging of breast: Secondary | ICD-10-CM

## 2024-02-21 ENCOUNTER — Ambulatory Visit (INDEPENDENT_AMBULATORY_CARE_PROVIDER_SITE_OTHER)
Admission: RE | Admit: 2024-02-21 | Discharge: 2024-02-21 | Disposition: A | Discharge status: Home / Self Care | Source: Ambulatory Visit | Attending: Internal Medicine | Admitting: Internal Medicine

## 2024-02-21 DIAGNOSIS — Z13820 Encounter for screening for osteoporosis: Secondary | ICD-10-CM

## 2024-02-21 DIAGNOSIS — Z780 Asymptomatic menopausal state: Secondary | ICD-10-CM

## 2024-02-24 ENCOUNTER — Ambulatory Visit: Payer: Self-pay | Admitting: Internal Medicine

## 2024-02-24 DIAGNOSIS — M85851 Other specified disorders of bone density and structure, right thigh: Secondary | ICD-10-CM

## 2024-02-24 DIAGNOSIS — M8580 Other specified disorders of bone density and structure, unspecified site: Secondary | ICD-10-CM | POA: Insufficient documentation

## 2024-03-01 ENCOUNTER — Inpatient Hospital Stay

## 2024-03-01 ENCOUNTER — Inpatient Hospital Stay: Attending: Hematology and Oncology | Admitting: Hematology and Oncology

## 2024-03-01 VITALS — BP 162/82 | HR 94 | Temp 97.1°F | Resp 16 | Wt 199.7 lb

## 2024-03-01 DIAGNOSIS — D631 Anemia in chronic kidney disease: Secondary | ICD-10-CM | POA: Diagnosis not present

## 2024-03-01 DIAGNOSIS — Z87891 Personal history of nicotine dependence: Secondary | ICD-10-CM | POA: Insufficient documentation

## 2024-03-01 DIAGNOSIS — N189 Chronic kidney disease, unspecified: Secondary | ICD-10-CM | POA: Diagnosis present

## 2024-03-01 DIAGNOSIS — D508 Other iron deficiency anemias: Secondary | ICD-10-CM

## 2024-03-01 LAB — CMP (CANCER CENTER ONLY)
ALT: 13 U/L (ref 0–44)
AST: 15 U/L (ref 15–41)
Albumin: 4 g/dL (ref 3.5–5.0)
Alkaline Phosphatase: 58 U/L (ref 38–126)
Anion gap: 7 (ref 5–15)
BUN: 34 mg/dL — ABNORMAL HIGH (ref 8–23)
CO2: 27 mmol/L (ref 22–32)
Calcium: 9.5 mg/dL (ref 8.9–10.3)
Chloride: 109 mmol/L (ref 98–111)
Creatinine: 1.99 mg/dL — ABNORMAL HIGH (ref 0.44–1.00)
GFR, Estimated: 25 mL/min — ABNORMAL LOW (ref >60)
Glucose, Bld: 113 mg/dL — ABNORMAL HIGH (ref 70–99)
Potassium: 4.2 mmol/L (ref 3.5–5.1)
Sodium: 143 mmol/L (ref 135–145)
Total Bilirubin: 0.3 mg/dL (ref 0.0–1.2)
Total Protein: 7 g/dL (ref 6.5–8.1)

## 2024-03-01 LAB — CBC WITH DIFFERENTIAL (CANCER CENTER ONLY)
Abs Immature Granulocytes: 0.01 K/uL (ref 0.00–0.07)
Basophils Absolute: 0 K/uL (ref 0.0–0.1)
Basophils Relative: 0 %
Eosinophils Absolute: 0.1 K/uL (ref 0.0–0.5)
Eosinophils Relative: 2 %
HCT: 31.1 % — ABNORMAL LOW (ref 36.0–46.0)
Hemoglobin: 10.4 g/dL — ABNORMAL LOW (ref 12.0–15.0)
Immature Granulocytes: 0 %
Lymphocytes Relative: 46 %
Lymphs Abs: 2.1 K/uL (ref 0.7–4.0)
MCH: 32.1 pg (ref 26.0–34.0)
MCHC: 33.4 g/dL (ref 30.0–36.0)
MCV: 96 fL (ref 80.0–100.0)
Monocytes Absolute: 0.6 K/uL (ref 0.1–1.0)
Monocytes Relative: 13 %
Neutro Abs: 1.8 K/uL (ref 1.7–7.7)
Neutrophils Relative %: 39 %
Platelet Count: 205 K/uL (ref 150–400)
RBC: 3.24 MIL/uL — ABNORMAL LOW (ref 3.87–5.11)
RDW: 14 % (ref 11.5–15.5)
WBC Count: 4.7 K/uL (ref 4.0–10.5)
nRBC: 0 % (ref 0.0–0.2)

## 2024-03-01 LAB — IRON AND IRON BINDING CAPACITY (CC-WL,HP ONLY)
Iron: 93 ug/dL (ref 28–170)
Saturation Ratios: 31 % (ref 10.4–31.8)
TIBC: 302 ug/dL (ref 250–450)
UIBC: 209 ug/dL (ref 148–442)

## 2024-03-01 LAB — FERRITIN: Ferritin: 160 ng/mL (ref 11–307)

## 2024-03-01 NOTE — Progress Notes (Signed)
 [facility] [phone]   [phone]) [phone]  PROGRESS NOTE  Patient Care Team: Geofm Glade PARAS, MD as PCP - General (Internal Medicine) Lavona Agent, MD as PCP - Cardiology (Cardiology) Fate Morna SAILOR, [facility] (Inactive) as Pharmacist (Pharmacist) Lita Lye, OD as Referring Physician (Optometry)  Hematological/Oncological History # Anemia in the Setting of Chronic Kidney Disease  05/19/2022: last visit with [name] 07/08/2022: establish care with Dr. Federico   CHIEF COMPLAINTS/PURPOSE OF CONSULTATION:  Anemia 2/2 to CKD   HISTORY OF PRESENTING ILLNESS:  Tina Molina 78 y.o. female with medical history significant for anemia secondary to CKD presents for a follow up. She was last seen by Dr. Federico on 12/08/2023. In the interim, she received her aranesp  injections every 4 weeks.  On exam today [name] reports that she has been well overall in the room since her last visit 3 months ago.  She has had no major changes in her health.  She denies any lightheadedness or dizziness but does have some occasional fatigue, low energy, and shortness of breath.  She reports she is sleeping well at night and takes melatonin to help.  She reports that she has been eating well.  She has not noticed any bleeding, bruising, or dark stools.  She reports that she has had no issues with nausea, vomiting, or diarrhea.  Denies any fevers, chills, sweats.  A full 10 point ROS is otherwise negative.  MEDICAL HISTORY:  Past Medical History:  Diagnosis Date   Anemia    Associated with leukopenia and lymphocytosis. This is been evaluated by Dr. Timmy . Her platelet count has been normal   Arthritis    Chronic back pain    Chronic kidney disease    Erythropoietin  deficiency anemia 02/23/2016   GERD (gastroesophageal reflux disease)    Hypertension    Lymphocytosis    MGUS (monoclonal gammopathy of unknown significance) 08/03/2012   IgA 792 11/04/11   Pneumonia  04/2010   OP   Renal insufficiency    Sarcoidosis    Thyroid  disease     SURGICAL HISTORY: Past Surgical History:  Procedure Laterality Date   CARPAL TUNNEL RELEASE     Bilateral    CATARACT EXTRACTION Right 05/2015   CATARACT EXTRACTION Left    CERVICAL DISCECTOMY  06/2010   Dr.Pool   CHOLECYSTECTOMY     COLONOSCOPY  2010   POSTERIOR LUMBAR FUSION  02/29/2012   Dr Mayo op respiratory & renal compromise   THYROIDECTOMY  2003   For goiter   UPPER GASTROINTESTINAL ENDOSCOPY  12/01/2011   DB    SOCIAL HISTORY: Social History   Socioeconomic History   Marital status: Divorced    Spouse name: Not on file   Number of children: 0   Years of education: 12   Highest education level: Not on file  Occupational History   Occupation: Retired    Associate Professor: RETIRED  Tobacco Use   Smoking status: Former    Current packs/day: 0.00    Average packs/day: 0.5 packs/day for 20.0 years (10.0 ttl pk-yrs)    Types: Cigarettes    Start date: 05/17/1977    Quit date: 05/17/1997    Years since quitting: 26.8   Smokeless tobacco: Never  Vaping Use   Vaping status: Never Used  Substance and Sexual Activity   Alcohol use: Yes    Alcohol/week: 1.0 standard drink of alcohol    Types: 1 Glasses of wine per week    Comment: occasionally ,  less than weekly   Drug use: No   Sexual activity: Not Currently  Other Topics Concern   Not on file  Social History Narrative   Patient is divorced and lives with her sister. Patient is retired.   Caffeine- sometimes - one cup   Right handed.   Social Drivers of Corporate Investment Banker Strain: Low Risk  (01/06/2024)   Overall Financial Resource Strain (CARDIA)    Difficulty of Paying Living Expenses: Not very hard  Food Insecurity: No Food Insecurity (01/06/2024)   Hunger Vital Sign    Worried About Running Out of Food in the Last Year: Never true    Ran Out of Food in the Last Year: Never true  Transportation Needs: No Transportation Needs  (01/06/2024)   PRAPARE - Administrator, Civil Service (Medical): No    Lack of Transportation (Non-Medical): No  Physical Activity: Inactive (01/06/2024)   Exercise Vital Sign    Days of Exercise per Week: 0 days    Minutes of Exercise per Session: 0 min  Stress: No Stress Concern Present (01/06/2024)   [employer] of Occupational Health - Occupational Stress Questionnaire    Feeling of Stress: Not at all  Social Connections: Socially Isolated (01/06/2024)   Social Connection and Isolation Panel    Frequency of Communication with Friends and Family: More than three times a week    Frequency of Social Gatherings with Friends and Family: Once a week    Attends Religious Services: Never    Database Administrator or Organizations: No    Attends Banker Meetings: Never    Marital Status: Divorced  Catering Manager Violence: Patient Unable To Answer (01/06/2024)   Humiliation, Afraid, Rape, and Kick questionnaire    Fear of Current or Ex-Partner: Patient unable to answer    Emotionally Abused: Patient unable to answer    Physically Abused: Patient unable to answer    Sexually Abused: Patient unable to answer    FAMILY HISTORY: Family History  Problem Relation Age of Onset   Diabetes Mother    Arthritis Mother    Glaucoma Mother    Heart disease Mother 1       CHF and apparent stenting.   Anemia Father    Arthritis Father    Heart failure Father 62       No CAD   Hypertension Sister    Kidney disease Sister    Anemia Sister    Hypertension Brother    Anemia Brother    Kidney disease Brother    Arthritis Paternal Grandmother    Diabetes Maternal Aunt    Colon cancer Neg Hx    Colon polyps Neg Hx    Esophageal cancer Neg Hx    Rectal cancer Neg Hx    Stomach cancer Neg Hx     ALLERGIES:  is allergic to penicillins, shellfish allergy, shellfish protein-containing drug products, codeine, infliximab , sulfonamide derivatives, azithromycin , and  iodine.  MEDICATIONS:  Current Outpatient Medications  Medication Sig Dispense Refill   aspirin  81 MG tablet Take 81 mg by mouth daily.     atorvastatin  (LIPITOR) 20 MG tablet TAKE 1 TABLET BY MOUTH ONCE  DAILY 100 tablet 2   betamethasone  valerate ointment (VALISONE ) 0.1 % Apply 1 Application topically 2 (two) times daily. 30 g 0   budesonide -formoterol  (SYMBICORT ) 160-4.5 MCG/ACT inhaler inhale 2 puffs if needed for wheezing 10.2 g 0   carvedilol  (COREG ) 25 MG tablet Take 1 tablet (  25 mg total) by mouth 2 (two) times daily. 180 tablet 3   clobetasol cream (TEMOVATE) 0.05 % Apply 1 Application topically 2 (two) times daily.     cyanocobalamin  100 MCG tablet Take 1 tablet (100 mcg total) by mouth daily. 90 tablet 3   cyclobenzaprine  (FLEXERIL ) 10 MG tablet Take 1 tablet (10 mg total) by mouth 3 (three) times daily. 270 tablet 1   famotidine  (PEPCID ) 40 MG tablet TAKE 1 TABLET BY MOUTH TWICE  DAILY 200 tablet 2   furosemide  (LASIX ) 20 MG tablet Take 1 tablet (20 mg total) by mouth daily. 90 tablet 2   gabapentin  (NEURONTIN ) 300 MG capsule TAKE 1 CAPSULE BY MOUTH 3 TIMES  DAILY 300 capsule 2   hydrALAZINE  (APRESOLINE ) 50 MG tablet TAKE 1 TABLET BY MOUTH 3 TIMES  DAILY 300 tablet 2   latanoprost (XALATAN) 0.005 % ophthalmic solution 1 drop at bedtime.     leflunomide  (ARAVA ) 20 MG tablet TAKE 1 TABLET(20 MG) BY MOUTH DAILY 90 tablet 1   levothyroxine  (SYNTHROID ) 175 MCG tablet Take 1 tablet p.o. 6 days a week, once a week take 2 tablets p.o.  Take 30 minutes prior to breakfast. 102 tablet 3   MELATONIN GUMMIES PO Take by mouth at bedtime as needed.     Multiple Vitamin (MULTIVITAMIN WITH MINERALS) TABS tablet Take 1 tablet by mouth daily. 90 tablet 3   spironolactone  (ALDACTONE ) 25 MG tablet TAKE 1 TABLET BY MOUTH DAILY 100 tablet 2   telmisartan  (MICARDIS ) 20 MG tablet Take 1 tablet (20 mg total) by mouth daily. 90 tablet 3   triamcinolone  ointment (KENALOG ) 0.5 % Apply topically 2 (two) times  daily as needed. 15 g 0   vitamin E  400 UNIT capsule Take 400 Units by mouth daily.      No current facility-administered medications for this visit.    REVIEW OF SYSTEMS:   Constitutional: ( - ) fevers, ( - )  chills , ( - ) night sweats Eyes: ( - ) blurriness of vision, ( - ) double vision, ( - ) watery eyes Ears, nose, mouth, throat, and face: ( - ) mucositis, ( - ) sore throat Respiratory: ( - ) cough, ( - ) dyspnea, ( - ) wheezes Cardiovascular: ( - ) palpitation, ( - ) chest discomfort, ( - ) lower extremity swelling Gastrointestinal:  ( - ) nausea, ( - ) heartburn, ( - ) change in bowel habits Skin: ( - ) abnormal skin rashes Lymphatics: ( - ) new lymphadenopathy, ( - ) easy bruising Neurological: ( - ) numbness, ( - ) tingling, ( - ) new weaknesses Behavioral/Psych: ( - ) mood change, ( - ) new changes  All other systems were reviewed with the patient and are negative.  PHYSICAL EXAMINATION:  Vitals:   03/01/24 1437  BP: (!) 162/82  Pulse: 94  Resp: 16  Temp: (!) 97.1 F (36.2 C)  SpO2: 99%    Filed Weights   03/01/24 1437  Weight: 199 lb 11.2 oz (90.6 kg)     GENERAL: well appearing elderly African-American female in NAD  SKIN: skin color, texture, turgor are normal, no rashes or significant lesions EYES: conjunctiva are pink and non-injected, sclera clear LUNGS: clear to auscultation and percussion with normal breathing effort HEART: regular rate & rhythm and no murmurs and no lower extremity edema Musculoskeletal: no cyanosis of digits and no clubbing  PSYCH: alert & oriented x 3, fluent speech NEURO: no focal motor/sensory deficits  LABORATORY DATA:  I have reviewed the data as listed    Latest Ref Rng & Units 03/01/2024    2:03 PM 02/02/2024    1:12 PM 01/05/2024    1:26 PM  CBC  WBC 4.0 - 10.5 K/uL 4.7  4.6  4.6   Hemoglobin 12.0 - 15.0 g/dL 89.5  88.9  9.8   Hematocrit 36.0 - 46.0 % 31.1  34.3  30.2   Platelets 150 - 400 K/uL 205  206  259         Latest Ref Rng & Units 03/01/2024    2:03 PM 02/02/2024    1:12 PM 01/05/2024    1:26 PM  CMP  Glucose 70 - 99 mg/dL 886  92  91   BUN 8 - 23 mg/dL 34  48  44   Creatinine 0.44 - 1.00 mg/dL 8.00  8.13  7.78   Sodium 135 - 145 mmol/L 143  139  141   Potassium 3.5 - 5.1 mmol/L 4.2  4.7  4.5   Chloride 98 - 111 mmol/L 109  111  108   CO2 22 - 32 mmol/L 27  23  25    Calcium  8.9 - 10.3 mg/dL 9.5  8.6  8.9   Total Protein 6.5 - 8.1 g/dL 7.0  7.0  7.0   Total Bilirubin 0.0 - 1.2 mg/dL 0.3  0.4  0.4   Alkaline Phos 38 - 126 U/L 58  60  73   AST 15 - 41 U/L 15  18  17    ALT 0 - 44 U/L 13  18  18       ASSESSMENT & PLAN Tina Molina 78 y.o. female with medical history significant for anemia secondary to CKD who presents for transferring care from [facility].  After review of the labs, review of the records, and discussion with the patient the patients findings are most consistent with anemia secondary to chronic kidney disease.  # Anemia in the Setting of Chronic Kidney Disease  --currently managed on Aranesp  300 mcg SQ q 28 days to maintain Hgb >10 --Recommend Aranesp  shot if Hgb is <10.  -- labs today show White blood cells 4.7, hemoglobin 10.4, MCV 96, platelets 205.  Iron levels show no deficiency at last check.  --will give Aranesp  300 mcg injection as needed and continue monthly. --RTC in 3 months with q 4 week labs and injection   No orders of the defined types were placed in this encounter.  All questions were answered. The patient knows to call the clinic with any problems, questions or concerns.  A total of more than 30 minutes were spent on this encounter with face-to-face time and non-face-to-face time, including preparing to see the patient, ordering tests and/or medications, counseling the patient and coordination of care as outlined above.   Norleen IVAR Kidney, MD Department of Hematology/Oncology [facility] Phone:  [phone] Pager: [phone] Email: [email or web address]  03/01/2024 2:51 PM

## 2024-03-11 ENCOUNTER — Encounter: Payer: Self-pay | Admitting: Hematology and Oncology

## 2024-03-13 ENCOUNTER — Other Ambulatory Visit: Payer: Self-pay | Admitting: Internal Medicine

## 2024-03-16 LAB — LAB REPORT - SCANNED
Albumin, Urine POC: 24.9
Creatinine, POC: 141 mg/dL
Microalb Creat Ratio: 18

## 2024-03-29 ENCOUNTER — Inpatient Hospital Stay

## 2024-04-05 ENCOUNTER — Inpatient Hospital Stay: Attending: Hematology and Oncology

## 2024-04-05 ENCOUNTER — Other Ambulatory Visit: Payer: Self-pay | Admitting: Hematology and Oncology

## 2024-04-05 ENCOUNTER — Inpatient Hospital Stay

## 2024-04-05 VITALS — BP 124/50 | HR 98 | Resp 18

## 2024-04-05 DIAGNOSIS — D631 Anemia in chronic kidney disease: Secondary | ICD-10-CM

## 2024-04-05 DIAGNOSIS — D508 Other iron deficiency anemias: Secondary | ICD-10-CM

## 2024-04-05 DIAGNOSIS — N184 Chronic kidney disease, stage 4 (severe): Secondary | ICD-10-CM | POA: Insufficient documentation

## 2024-04-05 LAB — CBC WITH DIFFERENTIAL (CANCER CENTER ONLY)
Abs Immature Granulocytes: 0.01 K/uL (ref 0.00–0.07)
Basophils Absolute: 0 K/uL (ref 0.0–0.1)
Basophils Relative: 0 %
Eosinophils Absolute: 0.1 K/uL (ref 0.0–0.5)
Eosinophils Relative: 3 %
HCT: 27.6 % — ABNORMAL LOW (ref 36.0–46.0)
Hemoglobin: 9.1 g/dL — ABNORMAL LOW (ref 12.0–15.0)
Immature Granulocytes: 0 %
Lymphocytes Relative: 44 %
Lymphs Abs: 2.2 K/uL (ref 0.7–4.0)
MCH: 32 pg (ref 26.0–34.0)
MCHC: 33 g/dL (ref 30.0–36.0)
MCV: 97.2 fL (ref 80.0–100.0)
Monocytes Absolute: 0.6 K/uL (ref 0.1–1.0)
Monocytes Relative: 12 %
Neutro Abs: 2 K/uL (ref 1.7–7.7)
Neutrophils Relative %: 41 %
Platelet Count: 183 K/uL (ref 150–400)
RBC: 2.84 MIL/uL — ABNORMAL LOW (ref 3.87–5.11)
RDW: 15.1 % (ref 11.5–15.5)
WBC Count: 4.8 K/uL (ref 4.0–10.5)
nRBC: 0 % (ref 0.0–0.2)

## 2024-04-05 LAB — CMP (CANCER CENTER ONLY)
ALT: 14 U/L (ref 0–44)
AST: 20 U/L (ref 15–41)
Albumin: 4.1 g/dL (ref 3.5–5.0)
Alkaline Phosphatase: 66 U/L (ref 38–126)
Anion gap: 11 (ref 5–15)
BUN: 27 mg/dL — ABNORMAL HIGH (ref 8–23)
CO2: 27 mmol/L (ref 22–32)
Calcium: 9.3 mg/dL (ref 8.9–10.3)
Chloride: 105 mmol/L (ref 98–111)
Creatinine: 1.87 mg/dL — ABNORMAL HIGH (ref 0.44–1.00)
GFR, Estimated: 27 mL/min — ABNORMAL LOW (ref >60)
Glucose, Bld: 91 mg/dL (ref 70–99)
Potassium: 4.2 mmol/L (ref 3.5–5.1)
Sodium: 143 mmol/L (ref 135–145)
Total Bilirubin: 0.4 mg/dL (ref 0.0–1.2)
Total Protein: 6.8 g/dL (ref 6.5–8.1)

## 2024-04-05 LAB — FERRITIN: Ferritin: 216 ng/mL (ref 11–307)

## 2024-04-05 MED ORDER — DARBEPOETIN ALFA 300 MCG/0.6ML IJ SOSY
300.0000 ug | PREFILLED_SYRINGE | Freq: Once | INTRAMUSCULAR | Status: AC
  Administered 2024-04-05: 300 ug via SUBCUTANEOUS
  Filled 2024-04-05: qty 0.6

## 2024-04-24 ENCOUNTER — Other Ambulatory Visit: Payer: Self-pay | Admitting: Internal Medicine

## 2024-04-26 ENCOUNTER — Inpatient Hospital Stay

## 2024-05-03 ENCOUNTER — Inpatient Hospital Stay

## 2024-05-03 ENCOUNTER — Inpatient Hospital Stay: Attending: Hematology and Oncology

## 2024-05-03 DIAGNOSIS — D631 Anemia in chronic kidney disease: Secondary | ICD-10-CM | POA: Diagnosis not present

## 2024-05-03 DIAGNOSIS — N189 Chronic kidney disease, unspecified: Secondary | ICD-10-CM | POA: Diagnosis present

## 2024-05-03 DIAGNOSIS — D508 Other iron deficiency anemias: Secondary | ICD-10-CM

## 2024-05-03 LAB — CMP (CANCER CENTER ONLY)
ALT: 13 U/L (ref 0–44)
AST: 20 U/L (ref 15–41)
Albumin: 4.3 g/dL (ref 3.5–5.0)
Alkaline Phosphatase: 72 U/L (ref 38–126)
Anion gap: 12 (ref 5–15)
BUN: 35 mg/dL — ABNORMAL HIGH (ref 8–23)
CO2: 23 mmol/L (ref 22–32)
Calcium: 8.9 mg/dL (ref 8.9–10.3)
Chloride: 106 mmol/L (ref 98–111)
Creatinine: 2.19 mg/dL — ABNORMAL HIGH (ref 0.44–1.00)
GFR, Estimated: 22 mL/min — ABNORMAL LOW (ref >60)
Glucose, Bld: 108 mg/dL — ABNORMAL HIGH (ref 70–99)
Potassium: 4.4 mmol/L (ref 3.5–5.1)
Sodium: 141 mmol/L (ref 135–145)
Total Bilirubin: 0.4 mg/dL (ref 0.0–1.2)
Total Protein: 7.3 g/dL (ref 6.5–8.1)

## 2024-05-03 LAB — CBC WITH DIFFERENTIAL (CANCER CENTER ONLY)
Abs Immature Granulocytes: 0 K/uL (ref 0.00–0.07)
Basophils Absolute: 0 K/uL (ref 0.0–0.1)
Basophils Relative: 1 %
Eosinophils Absolute: 0.2 K/uL (ref 0.0–0.5)
Eosinophils Relative: 4 %
HCT: 31.6 % — ABNORMAL LOW (ref 36.0–46.0)
Hemoglobin: 10.1 g/dL — ABNORMAL LOW (ref 12.0–15.0)
Immature Granulocytes: 0 %
Lymphocytes Relative: 55 %
Lymphs Abs: 2.3 K/uL (ref 0.7–4.0)
MCH: 32.5 pg (ref 26.0–34.0)
MCHC: 32 g/dL (ref 30.0–36.0)
MCV: 101.6 fL — ABNORMAL HIGH (ref 80.0–100.0)
Monocytes Absolute: 0.6 K/uL (ref 0.1–1.0)
Monocytes Relative: 14 %
Neutro Abs: 1.1 K/uL — ABNORMAL LOW (ref 1.7–7.7)
Neutrophils Relative %: 26 %
Platelet Count: 209 K/uL (ref 150–400)
RBC: 3.11 MIL/uL — ABNORMAL LOW (ref 3.87–5.11)
RDW: 14.6 % (ref 11.5–15.5)
WBC Count: 4.1 K/uL (ref 4.0–10.5)
nRBC: 0 % (ref 0.0–0.2)

## 2024-05-03 LAB — FERRITIN: Ferritin: 175 ng/mL (ref 11–307)

## 2024-05-03 NOTE — Progress Notes (Signed)
 Aranesp  held Hgb 10.1 Labs printed handed to pt verbalized understanding.

## 2024-05-24 ENCOUNTER — Inpatient Hospital Stay

## 2024-05-24 ENCOUNTER — Inpatient Hospital Stay: Admitting: Hematology and Oncology

## 2024-05-27 ENCOUNTER — Other Ambulatory Visit: Payer: Self-pay | Admitting: Internal Medicine

## 2024-05-28 ENCOUNTER — Telehealth: Payer: Self-pay

## 2024-05-28 ENCOUNTER — Other Ambulatory Visit: Payer: Self-pay

## 2024-05-28 DIAGNOSIS — M0579 Rheumatoid arthritis with rheumatoid factor of multiple sites without organ or systems involvement: Secondary | ICD-10-CM

## 2024-05-28 NOTE — Telephone Encounter (Signed)
 Copied from CRM [id number]. Topic: Referral - Question >> May 28, 2024  8:57 AM Thersia BROCKS wrote: Reason for CRM: [facility] called in stated needs  [facility] Referral to be seen for provider  Dr.James Mai   Fax : [phone] Diagnosis Code : M05.89

## 2024-05-28 NOTE — Telephone Encounter (Signed)
Referral ordered today 

## 2024-05-29 ENCOUNTER — Other Ambulatory Visit: Payer: Self-pay

## 2024-05-29 DIAGNOSIS — M0579 Rheumatoid arthritis with rheumatoid factor of multiple sites without organ or systems involvement: Secondary | ICD-10-CM

## 2024-05-29 NOTE — Telephone Encounter (Signed)
 Location changed and new order placed.

## 2024-05-29 NOTE — Telephone Encounter (Unsigned)
 Copied from CRM [id number]. Topic: Referral - Question >> May 28, 2024  8:57 AM Thersia BROCKS wrote: Reason for CRM: [facility] called in stated needs  [facility] Referral to be seen for provider  Dr.James Mai   Fax : [phone] Diagnosis Code : M05.89 >> May 29, 2024  8:20 AM Logan F wrote: Jannis calling from [facility]. She says the [facility] referral that was done was done for [facility] . Pt is seeing [facility] called in stated needs  [facility] Referral to be seen for provider  Esmond Mai    Fax : [phone] Diagnosis Code : M05.89

## 2024-05-31 ENCOUNTER — Inpatient Hospital Stay

## 2024-05-31 ENCOUNTER — Inpatient Hospital Stay: Admitting: Hematology and Oncology

## 2024-05-31 ENCOUNTER — Inpatient Hospital Stay: Attending: Hematology and Oncology

## 2024-05-31 VITALS — BP 176/82 | HR 96 | Temp 97.0°F | Resp 16 | Wt 205.6 lb

## 2024-05-31 DIAGNOSIS — D508 Other iron deficiency anemias: Secondary | ICD-10-CM

## 2024-05-31 DIAGNOSIS — D631 Anemia in chronic kidney disease: Secondary | ICD-10-CM

## 2024-05-31 DIAGNOSIS — N181 Chronic kidney disease, stage 1: Secondary | ICD-10-CM

## 2024-05-31 DIAGNOSIS — N184 Chronic kidney disease, stage 4 (severe): Secondary | ICD-10-CM | POA: Diagnosis present

## 2024-05-31 LAB — CMP (CANCER CENTER ONLY)
ALT: 14 U/L (ref 0–44)
AST: 20 U/L (ref 15–41)
Albumin: 4.2 g/dL (ref 3.5–5.0)
Alkaline Phosphatase: 78 U/L (ref 38–126)
Anion gap: 11 (ref 5–15)
BUN: 28 mg/dL — ABNORMAL HIGH (ref 8–23)
CO2: 25 mmol/L (ref 22–32)
Calcium: 9.1 mg/dL (ref 8.9–10.3)
Chloride: 107 mmol/L (ref 98–111)
Creatinine: 1.84 mg/dL — ABNORMAL HIGH (ref 0.44–1.00)
GFR, Estimated: 28 mL/min — ABNORMAL LOW
Glucose, Bld: 103 mg/dL — ABNORMAL HIGH (ref 70–99)
Potassium: 4.2 mmol/L (ref 3.5–5.1)
Sodium: 144 mmol/L (ref 135–145)
Total Bilirubin: 0.3 mg/dL (ref 0.0–1.2)
Total Protein: 7.1 g/dL (ref 6.5–8.1)

## 2024-05-31 LAB — CBC WITH DIFFERENTIAL (CANCER CENTER ONLY)
Abs Immature Granulocytes: 0.01 K/uL (ref 0.00–0.07)
Basophils Absolute: 0 K/uL (ref 0.0–0.1)
Basophils Relative: 1 %
Eosinophils Absolute: 0.1 K/uL (ref 0.0–0.5)
Eosinophils Relative: 3 %
HCT: 29 % — ABNORMAL LOW (ref 36.0–46.0)
Hemoglobin: 9.6 g/dL — ABNORMAL LOW (ref 12.0–15.0)
Immature Granulocytes: 0 %
Lymphocytes Relative: 44 %
Lymphs Abs: 2 K/uL (ref 0.7–4.0)
MCH: 32.7 pg (ref 26.0–34.0)
MCHC: 33.1 g/dL (ref 30.0–36.0)
MCV: 98.6 fL (ref 80.0–100.0)
Monocytes Absolute: 0.7 K/uL (ref 0.1–1.0)
Monocytes Relative: 15 %
Neutro Abs: 1.7 K/uL (ref 1.7–7.7)
Neutrophils Relative %: 37 %
Platelet Count: 176 K/uL (ref 150–400)
RBC: 2.94 MIL/uL — ABNORMAL LOW (ref 3.87–5.11)
RDW: 13.7 % (ref 11.5–15.5)
WBC Count: 4.5 K/uL (ref 4.0–10.5)
nRBC: 0 % (ref 0.0–0.2)

## 2024-05-31 LAB — FERRITIN: Ferritin: 134 ng/mL (ref 11–307)

## 2024-05-31 MED ORDER — DARBEPOETIN ALFA 300 MCG/0.6ML IJ SOSY
300.0000 ug | PREFILLED_SYRINGE | Freq: Once | INTRAMUSCULAR | Status: AC
  Administered 2024-05-31: 300 ug via SUBCUTANEOUS
  Filled 2024-05-31: qty 0.6

## 2024-05-31 NOTE — Progress Notes (Signed)
 " [facility] [phone]   [phone]  PROGRESS NOTE  Patient Care Team: Geofm Glade PARAS, MD as PCP - General (Internal Medicine) Lavona Agent, MD as PCP - Cardiology (Cardiology) Fate Morna SAILOR, [facility] (Inactive) as Pharmacist (Pharmacist) Lita Lye, OD as Referring Physician (Optometry)  Hematological/Oncological History # Anemia in the Setting of Chronic Kidney Disease  05/19/2022: last visit with [name] 07/08/2022: establish care with Dr. Federico   CHIEF COMPLAINTS/PURPOSE OF CONSULTATION:  Anemia 2/2 to CKD   HISTORY OF PRESENTING ILLNESS:  Tina Molina 79 y.o. female with medical history significant for anemia secondary to CKD presents for a follow up. She was last seen on 03/01/2024. In the interim, she received her aranesp  injections every 4 weeks.  On exam today Tina Molina reports she has been well overall in interim since her last visit.  She reports her energy levels about a 6 out of 10.  Her appetite is okay.  She reports she is not currently having any lightheadedness, dizziness but does have some occasional shortness of breath on exertion.  She reports she is doing her best to try to stay hydrated.  She reports has not had any issues with runny nose, sore throat, cough.  She denies any fevers, chills, sweats.  She is tolerating her EPO shots well with no redness, itching, or rash at the site of injection.  She has no issues with headache or muscle aches.  Overall she feels well and is willing and able to continue with Aranesp  at this time.  Full 10 point ROS is otherwise negative.  MEDICAL HISTORY:  Past Medical History:  Diagnosis Date   Anemia    Associated with leukopenia and lymphocytosis. This is been evaluated by Dr. Timmy . Her platelet count has been normal   Arthritis    Chronic back pain    Chronic kidney disease    Erythropoietin  deficiency anemia 02/23/2016   GERD (gastroesophageal reflux disease)     Hypertension    Lymphocytosis    MGUS (monoclonal gammopathy of unknown significance) 08/03/2012   IgA 792 11/04/11   Pneumonia 04/2010   OP   Renal insufficiency    Sarcoidosis    Thyroid  disease     SURGICAL HISTORY: Past Surgical History:  Procedure Laterality Date   CARPAL TUNNEL RELEASE     Bilateral    CATARACT EXTRACTION Right 05/2015   CATARACT EXTRACTION Left    CERVICAL DISCECTOMY  06/2010   Dr.Pool   CHOLECYSTECTOMY     COLONOSCOPY  2010   POSTERIOR LUMBAR FUSION  02/29/2012   Dr Mayo op respiratory & renal compromise   THYROIDECTOMY  2003   For goiter   UPPER GASTROINTESTINAL ENDOSCOPY  12/01/2011   DB    SOCIAL HISTORY: Social History   Socioeconomic History   Marital status: Divorced    Spouse name: Not on file   Number of children: 0   Years of education: 12   Highest education level: Not on file  Occupational History   Occupation: Retired    Associate Professor: RETIRED  Tobacco Use   Smoking status: Former    Current packs/day: 0.00    Average packs/day: 0.5 packs/day for 20.0 years (10.0 ttl pk-yrs)    Types: Cigarettes    Start date: 05/17/1977    Quit date: 05/17/1997    Years since quitting: 27.0   Smokeless tobacco: Never  Vaping Use   Vaping status: Never Used  Substance and Sexual Activity  Alcohol use: Yes    Alcohol/week: 1.0 standard drink of alcohol    Types: 1 Glasses of wine per week    Comment: occasionally , less than weekly   Drug use: No   Sexual activity: Not Currently  Other Topics Concern   Not on file  Social History Narrative   Patient is divorced and lives with her sister. Patient is retired.   Caffeine- sometimes - one cup   Right handed.   Social Drivers of Health   Tobacco Use: Medium Risk (01/06/2024)   Patient History    Smoking Tobacco Use: Former    Smokeless Tobacco Use: Never    Passive Exposure: Not on file  Financial Resource Strain: Low Risk (01/06/2024)   Overall Financial Resource Strain (CARDIA)     Difficulty of Paying Living Expenses: Not very hard  Food Insecurity: No Food Insecurity (01/06/2024)   Epic    Worried About Running Out of Food in the Last Year: Never true    Ran Out of Food in the Last Year: Never true  Transportation Needs: No Transportation Needs (01/06/2024)   Epic    Lack of Transportation (Medical): No    Lack of Transportation (Non-Medical): No  Physical Activity: Inactive (01/06/2024)   Exercise Vital Sign    Days of Exercise per Week: 0 days    Minutes of Exercise per Session: 0 min  Stress: No Stress Concern Present (01/06/2024)   [employer] of Occupational Health - Occupational Stress Questionnaire    Feeling of Stress: Not at all  Social Connections: Socially Isolated (01/06/2024)   Social Connection and Isolation Panel    Frequency of Communication with Friends and Family: More than three times a week    Frequency of Social Gatherings with Friends and Family: Once a week    Attends Religious Services: Never    Database Administrator or Organizations: No    Attends Banker Meetings: Never    Marital Status: Divorced  Catering Manager Violence: Patient Unable To Answer (01/06/2024)   Epic    Fear of Current or Ex-Partner: Patient unable to answer    Emotionally Abused: Patient unable to answer    Physically Abused: Patient unable to answer    Sexually Abused: Patient unable to answer  Depression (PHQ2-9): Low Risk (01/06/2024)   Depression (PHQ2-9)    PHQ-2 Score: 3  Alcohol Screen: Low Risk (01/06/2024)   Alcohol Screen    Last Alcohol Screening Score (AUDIT): 0  Housing: Unknown (01/06/2024)   Epic    Unable to Pay for Housing in the Last Year: No    Number of Times Moved in the Last Year: Not on file    Homeless in the Last Year: No  Utilities: Not At Risk (01/06/2024)   Epic    Threatened with loss of utilities: No  Health Literacy: Adequate Health Literacy (01/06/2024)   B1300 Health Literacy    Frequency of need for help with  medical instructions: Never    FAMILY HISTORY: Family History  Problem Relation Age of Onset   Diabetes Mother    Arthritis Mother    Glaucoma Mother    Heart disease Mother 68       CHF and apparent stenting.   Anemia Father    Arthritis Father    Heart failure Father 20       No CAD   Hypertension Sister    Kidney disease Sister    Anemia Sister    Hypertension Brother  Anemia Brother    Kidney disease Brother    Arthritis Paternal Grandmother    Diabetes Maternal Aunt    Colon cancer Neg Hx    Colon polyps Neg Hx    Esophageal cancer Neg Hx    Rectal cancer Neg Hx    Stomach cancer Neg Hx     ALLERGIES:  is allergic to penicillins, shellfish allergy, shellfish protein-containing drug products, codeine, infliximab , sulfonamide derivatives, azithromycin , and iodine.  MEDICATIONS:  Current Outpatient Medications  Medication Sig Dispense Refill   aspirin  81 MG tablet Take 81 mg by mouth daily.     atorvastatin  (LIPITOR) 20 MG tablet TAKE 1 TABLET BY MOUTH ONCE  DAILY 100 tablet 2   betamethasone  valerate ointment (VALISONE ) 0.1 % Apply 1 Application topically 2 (two) times daily. 30 g 0   budesonide -formoterol  (SYMBICORT ) 160-4.5 MCG/ACT inhaler inhale 2 puffs if needed for wheezing 10.2 g 0   carvedilol  (COREG ) 25 MG tablet TAKE 1 TABLET BY MOUTH TWICE  DAILY 200 tablet 2   clobetasol cream (TEMOVATE) 0.05 % Apply 1 Application topically 2 (two) times daily.     cyanocobalamin  100 MCG tablet Take 1 tablet (100 mcg total) by mouth daily. 90 tablet 3   cyclobenzaprine  (FLEXERIL ) 10 MG tablet TAKE 1 TABLET BY MOUTH 3 TIMES  DAILY 270 tablet 1   famotidine  (PEPCID ) 40 MG tablet TAKE 1 TABLET BY MOUTH TWICE  DAILY 200 tablet 2   furosemide  (LASIX ) 20 MG tablet Take 1 tablet (20 mg total) by mouth daily. 90 tablet 2   gabapentin  (NEURONTIN ) 300 MG capsule TAKE 1 CAPSULE BY MOUTH 3 TIMES  DAILY 300 capsule 2   hydrALAZINE  (APRESOLINE ) 50 MG tablet TAKE 1 TABLET BY MOUTH 3  TIMES  DAILY 300 tablet 2   latanoprost (XALATAN) 0.005 % ophthalmic solution 1 drop at bedtime.     leflunomide  (ARAVA ) 20 MG tablet TAKE 1 TABLET(20 MG) BY MOUTH DAILY 90 tablet 1   levothyroxine  (SYNTHROID ) 175 MCG tablet TAKE 1 TABLET BY MOUTH 6 DAYS  PER WEEK AND TAKE 2 TABLETS BY  MOUTH ONCE WEEKLY TAKE 1/2 HOUR  PRIOR TO BREAKFAST 116 tablet 2   MELATONIN GUMMIES PO Take by mouth at bedtime as needed.     Multiple Vitamin (MULTIVITAMIN WITH MINERALS) TABS tablet Take 1 tablet by mouth daily. 90 tablet 3   spironolactone  (ALDACTONE ) 25 MG tablet TAKE 1 TABLET BY MOUTH DAILY 100 tablet 2   telmisartan  (MICARDIS ) 20 MG tablet TAKE 1 TABLET BY MOUTH DAILY 100 tablet 2   triamcinolone  ointment (KENALOG ) 0.5 % Apply topically 2 (two) times daily as needed. 15 g 0   vitamin E  400 UNIT capsule Take 400 Units by mouth daily.      No current facility-administered medications for this visit.    REVIEW OF SYSTEMS:   Constitutional: ( - ) fevers, ( - )  chills , ( - ) night sweats Eyes: ( - ) blurriness of vision, ( - ) double vision, ( - ) watery eyes Ears, nose, mouth, throat, and face: ( - ) mucositis, ( - ) sore throat Respiratory: ( - ) cough, ( - ) dyspnea, ( - ) wheezes Cardiovascular: ( - ) palpitation, ( - ) chest discomfort, ( - ) lower extremity swelling Gastrointestinal:  ( - ) nausea, ( - ) heartburn, ( - ) change in bowel habits Skin: ( - ) abnormal skin rashes Lymphatics: ( - ) new lymphadenopathy, ( - ) easy bruising Neurological: ( - )  numbness, ( - ) tingling, ( - ) new weaknesses Behavioral/Psych: ( - ) mood change, ( - ) new changes  All other systems were reviewed with the patient and are negative.  PHYSICAL EXAMINATION:  Vitals:   05/31/24 1425 05/31/24 1426  BP: (!) 155/76 (!) 176/82  Pulse: 96   Resp: 16   Temp: (!) 97 F (36.1 C)   SpO2: 100%      Filed Weights   05/31/24 1425  Weight: 205 lb 9.6 oz (93.3 kg)      GENERAL: well appearing elderly  African-American female in NAD  SKIN: skin color, texture, turgor are normal, no rashes or significant lesions EYES: conjunctiva are pink and non-injected, sclera clear LUNGS: clear to auscultation and percussion with normal breathing effort HEART: regular rate & rhythm and no murmurs and no lower extremity edema Musculoskeletal: no cyanosis of digits and no clubbing  PSYCH: alert & oriented x 3, fluent speech NEURO: no focal motor/sensory deficits  LABORATORY DATA:  I have reviewed the data as listed    Latest Ref Rng & Units 05/31/2024    1:57 PM 05/03/2024    1:29 PM 04/05/2024    1:34 PM  CBC  WBC 4.0 - 10.5 K/uL 4.5  4.1  4.8   Hemoglobin 12.0 - 15.0 g/dL 9.6  89.8  9.1   Hematocrit 36.0 - 46.0 % 29.0  31.6  27.6   Platelets 150 - 400 K/uL 176  209  183        Latest Ref Rng & Units 05/31/2024    1:57 PM 05/03/2024    1:29 PM 04/05/2024    1:34 PM  CMP  Glucose 70 - 99 mg/dL 896  891  91   BUN 8 - 23 mg/dL 28  35  27   Creatinine 0.44 - 1.00 mg/dL 8.15  7.80  8.12   Sodium 135 - 145 mmol/L 144  141  143   Potassium 3.5 - 5.1 mmol/L 4.2  4.4  4.2   Chloride 98 - 111 mmol/L 107  106  105   CO2 22 - 32 mmol/L 25  23  27    Calcium  8.9 - 10.3 mg/dL 9.1  8.9  9.3   Total Protein 6.5 - 8.1 g/dL 7.1  7.3  6.8   Total Bilirubin 0.0 - 1.2 mg/dL 0.3  0.4  0.4   Alkaline Phos 38 - 126 U/L 78  72  66   AST 15 - 41 U/L 20  20  20    ALT 0 - 44 U/L 14  13  14       ASSESSMENT & PLAN Tina JINNY Molina 79 y.o. female with medical history significant for anemia secondary to CKD who presents for transferring care from [facility].  After review of the labs, review of the records, and discussion with the patient the patients findings are most consistent with anemia secondary to chronic kidney disease.  # Anemia in the Setting of Chronic Kidney Disease  --currently managed on Aranesp  300 mcg SQ q 28 days to maintain Hgb >10 --Recommend Aranesp  shot if Hgb is <10.  -- labs today show  White blood cells 4.5, Hgb 9.6, MCV 98.6, Plt 176. Cr 1.84.  Iron levels show no deficiency at last check.  --continue Aranesp  300 mcg injection as needed monthly. --RTC in 3 months with q 4 week labs and injection   No orders of the defined types were placed in this encounter.  All questions were answered.  The patient knows to call the clinic with any problems, questions or concerns.  A total of more than 25 minutes were spent on this encounter with face-to-face time and non-face-to-face time, including preparing to see the patient, ordering tests and/or medications, counseling the patient and coordination of care as outlined above.   Norleen IVAR Kidney, MD Department of Hematology/Oncology [facility] at [facility] Phone: [phone] Pager: [phone] Email: [email or web address]  06/01/2024 9:26 AM "

## 2024-06-01 ENCOUNTER — Encounter: Payer: Self-pay | Admitting: Hematology and Oncology

## 2024-06-17 ENCOUNTER — Other Ambulatory Visit: Payer: Self-pay | Admitting: Internal Medicine

## 2024-06-17 DIAGNOSIS — I10 Essential (primary) hypertension: Secondary | ICD-10-CM

## 2024-06-28 ENCOUNTER — Inpatient Hospital Stay

## 2024-07-26 ENCOUNTER — Inpatient Hospital Stay

## 2024-08-23 ENCOUNTER — Inpatient Hospital Stay: Admitting: Hematology and Oncology

## 2024-08-23 ENCOUNTER — Inpatient Hospital Stay

## 2024-09-20 ENCOUNTER — Inpatient Hospital Stay

## 2024-10-18 ENCOUNTER — Inpatient Hospital Stay

## 2024-11-15 ENCOUNTER — Inpatient Hospital Stay: Admitting: Hematology and Oncology

## 2024-11-15 ENCOUNTER — Inpatient Hospital Stay

## 2024-12-13 ENCOUNTER — Inpatient Hospital Stay

## 2025-01-07 ENCOUNTER — Ambulatory Visit
